# Patient Record
Sex: Male | Born: 1960 | Race: Black or African American | Hispanic: No | Smoking: Former smoker
Health system: Southern US, Community
[De-identification: ages and names within clinical notes are randomized; demographics above are authoritative.]

## PROBLEM LIST (undated history)

## (undated) DIAGNOSIS — I82409 Acute embolism and thrombosis of unspecified deep veins of unspecified lower extremity: Secondary | ICD-10-CM

## (undated) DIAGNOSIS — J9600 Acute respiratory failure, unspecified whether with hypoxia or hypercapnia: Secondary | ICD-10-CM

## (undated) DIAGNOSIS — I4892 Unspecified atrial flutter: Secondary | ICD-10-CM

## (undated) DIAGNOSIS — R011 Cardiac murmur, unspecified: Secondary | ICD-10-CM

## (undated) DIAGNOSIS — Z950 Presence of cardiac pacemaker: Secondary | ICD-10-CM

## (undated) DIAGNOSIS — I2510 Atherosclerotic heart disease of native coronary artery without angina pectoris: Secondary | ICD-10-CM

## (undated) DIAGNOSIS — I10 Essential (primary) hypertension: Secondary | ICD-10-CM

## (undated) DIAGNOSIS — F32A Depression, unspecified: Secondary | ICD-10-CM

## (undated) DIAGNOSIS — I509 Heart failure, unspecified: Secondary | ICD-10-CM

## (undated) DIAGNOSIS — K7200 Acute and subacute hepatic failure without coma: Secondary | ICD-10-CM

## (undated) DIAGNOSIS — N289 Disorder of kidney and ureter, unspecified: Secondary | ICD-10-CM

## (undated) DIAGNOSIS — E119 Type 2 diabetes mellitus without complications: Secondary | ICD-10-CM

## (undated) DIAGNOSIS — N179 Acute kidney failure, unspecified: Secondary | ICD-10-CM

## (undated) HISTORY — PX: TONSILLECTOMY: SUR1361

## (undated) HISTORY — PX: HERNIA REPAIR: SHX51

## (undated) HISTORY — PX: COLONOSCOPY: SHX174

## (undated) HISTORY — PX: CYST REMOVAL NECK: SHX6281

---

## 2015-12-24 DIAGNOSIS — T24231A Burn of second degree of right lower leg, initial encounter: Secondary | ICD-10-CM | POA: Diagnosis not present

## 2017-09-21 DIAGNOSIS — K4090 Unilateral inguinal hernia, without obstruction or gangrene, not specified as recurrent: Secondary | ICD-10-CM | POA: Diagnosis not present

## 2017-09-21 DIAGNOSIS — F17210 Nicotine dependence, cigarettes, uncomplicated: Secondary | ICD-10-CM | POA: Diagnosis not present

## 2017-09-21 DIAGNOSIS — F17200 Nicotine dependence, unspecified, uncomplicated: Secondary | ICD-10-CM | POA: Diagnosis not present

## 2017-09-21 DIAGNOSIS — Z0000 Encounter for general adult medical examination without abnormal findings: Secondary | ICD-10-CM | POA: Diagnosis not present

## 2017-09-21 DIAGNOSIS — C61 Malignant neoplasm of prostate: Secondary | ICD-10-CM | POA: Diagnosis not present

## 2017-09-27 DIAGNOSIS — I10 Essential (primary) hypertension: Secondary | ICD-10-CM | POA: Diagnosis not present

## 2017-09-27 DIAGNOSIS — F17200 Nicotine dependence, unspecified, uncomplicated: Secondary | ICD-10-CM | POA: Diagnosis not present

## 2017-09-27 DIAGNOSIS — E1165 Type 2 diabetes mellitus with hyperglycemia: Secondary | ICD-10-CM | POA: Diagnosis not present

## 2017-09-27 DIAGNOSIS — E785 Hyperlipidemia, unspecified: Secondary | ICD-10-CM | POA: Diagnosis not present

## 2017-10-09 ENCOUNTER — Ambulatory Visit: Payer: BLUE CROSS/BLUE SHIELD | Admitting: General Surgery

## 2017-10-30 ENCOUNTER — Ambulatory Visit: Payer: BLUE CROSS/BLUE SHIELD | Admitting: General Surgery

## 2018-07-17 DIAGNOSIS — Z23 Encounter for immunization: Secondary | ICD-10-CM | POA: Diagnosis not present

## 2018-07-17 DIAGNOSIS — Z716 Tobacco abuse counseling: Secondary | ICD-10-CM | POA: Diagnosis not present

## 2018-07-17 DIAGNOSIS — E1165 Type 2 diabetes mellitus with hyperglycemia: Secondary | ICD-10-CM | POA: Diagnosis not present

## 2018-07-17 DIAGNOSIS — Z13220 Encounter for screening for lipoid disorders: Secondary | ICD-10-CM | POA: Diagnosis not present

## 2018-08-21 DIAGNOSIS — Z9889 Other specified postprocedural states: Secondary | ICD-10-CM | POA: Insufficient documentation

## 2018-09-06 DIAGNOSIS — Z7189 Other specified counseling: Secondary | ICD-10-CM | POA: Diagnosis not present

## 2018-09-06 DIAGNOSIS — E1165 Type 2 diabetes mellitus with hyperglycemia: Secondary | ICD-10-CM | POA: Diagnosis not present

## 2019-06-22 DIAGNOSIS — I82409 Acute embolism and thrombosis of unspecified deep veins of unspecified lower extremity: Secondary | ICD-10-CM

## 2019-06-22 HISTORY — DX: Acute embolism and thrombosis of unspecified deep veins of unspecified lower extremity: I82.409

## 2019-07-08 DIAGNOSIS — K7200 Acute and subacute hepatic failure without coma: Secondary | ICD-10-CM | POA: Insufficient documentation

## 2019-07-08 DIAGNOSIS — I480 Paroxysmal atrial fibrillation: Secondary | ICD-10-CM | POA: Insufficient documentation

## 2019-07-08 DIAGNOSIS — IMO0002 Reserved for concepts with insufficient information to code with codable children: Secondary | ICD-10-CM | POA: Insufficient documentation

## 2019-07-08 DIAGNOSIS — R570 Cardiogenic shock: Secondary | ICD-10-CM | POA: Insufficient documentation

## 2019-07-08 DIAGNOSIS — N179 Acute kidney failure, unspecified: Secondary | ICD-10-CM | POA: Insufficient documentation

## 2019-08-01 DIAGNOSIS — I509 Heart failure, unspecified: Secondary | ICD-10-CM | POA: Insufficient documentation

## 2019-08-01 DIAGNOSIS — I5023 Acute on chronic systolic (congestive) heart failure: Secondary | ICD-10-CM | POA: Insufficient documentation

## 2019-08-01 DIAGNOSIS — I5022 Chronic systolic (congestive) heart failure: Secondary | ICD-10-CM | POA: Insufficient documentation

## 2019-08-22 DIAGNOSIS — R911 Solitary pulmonary nodule: Secondary | ICD-10-CM | POA: Insufficient documentation

## 2019-08-24 DIAGNOSIS — E119 Type 2 diabetes mellitus without complications: Secondary | ICD-10-CM | POA: Insufficient documentation

## 2019-08-24 DIAGNOSIS — R9431 Abnormal electrocardiogram [ECG] [EKG]: Secondary | ICD-10-CM | POA: Insufficient documentation

## 2019-08-24 DIAGNOSIS — I82403 Acute embolism and thrombosis of unspecified deep veins of lower extremity, bilateral: Secondary | ICD-10-CM | POA: Insufficient documentation

## 2019-08-24 DIAGNOSIS — N186 End stage renal disease: Secondary | ICD-10-CM | POA: Insufficient documentation

## 2019-09-28 DIAGNOSIS — J9600 Acute respiratory failure, unspecified whether with hypoxia or hypercapnia: Secondary | ICD-10-CM | POA: Insufficient documentation

## 2019-12-11 ENCOUNTER — Encounter (HOSPITAL_COMMUNITY): Payer: Self-pay

## 2019-12-11 ENCOUNTER — Ambulatory Visit (HOSPITAL_COMMUNITY): Payer: Medicaid Other | Admitting: Physical Therapy

## 2019-12-12 ENCOUNTER — Other Ambulatory Visit: Payer: Self-pay

## 2019-12-12 ENCOUNTER — Ambulatory Visit (HOSPITAL_COMMUNITY)
Admission: RE | Admit: 2019-12-12 | Discharge: 2019-12-12 | Disposition: A | Discharge status: Home / Self Care | Payer: Medicaid Other | Source: Ambulatory Visit | Attending: Internal Medicine | Admitting: Internal Medicine

## 2019-12-12 ENCOUNTER — Encounter: Payer: Self-pay | Admitting: Internal Medicine

## 2019-12-12 ENCOUNTER — Ambulatory Visit (INDEPENDENT_AMBULATORY_CARE_PROVIDER_SITE_OTHER): Payer: Medicaid Other | Admitting: Internal Medicine

## 2019-12-12 DIAGNOSIS — R0600 Dyspnea, unspecified: Secondary | ICD-10-CM

## 2019-12-12 DIAGNOSIS — R0609 Other forms of dyspnea: Secondary | ICD-10-CM

## 2019-12-12 NOTE — Patient Instructions (Signed)
No pulmonary medications are needed   Please remember to go to the  x-ray department  for your tests - we will call you with the results when they are available    Please schedule a follow up office visit in 6 weeks, call sooner if needed - PFTs on return

## 2019-12-12 NOTE — Assessment & Plan Note (Addendum)
Onset fall 2020 dx chf/ aflutter AKI/ HCAP req trach > out by d/c room air  - Quit smoking 06/2019  - 12/12/2019   Walked RA x two laps =  approx 239ft @ moderate pace - stopped due to sob with sats of 100 % at the end of the study.  Available records reviewed - luckily it does not appear he had significant underlying copd or other irreversible lung injury acutely or chronically despite suggestion of clubbing on exam but needs f/u    >>> rec proceed with rehab as appears severely debilitated by well compensated from chf perspective / return for PFTs for baseline.  >>> beware of ACEi use in this setting (ACE inhibitors are problematic in  pts with airway complaints because  even experienced pulmonologists can't always distinguish ace effects from copd/asthma.  By themselves they don't actually cause a problem, much like oxygen can't by itself start a fire, but they certainly serve as a powerful catalyst or enhancer for any "fire"  or inflammatory process in the upper airway, be it caused by an ET  tube or more commonly reflux (especially in the obese or pts with known GERD or who are on biphoshonates).   >>> for now ok to continue acei          Each maintenance medication was reviewed in detail including emphasizing most importantly the difference between maintenance and prns and under what circumstances the prns are to be triggered using an action plan format where appropriate.  Total time for H and P, chart review, counseling,  directly observing portions of ambulatory 02 saturation study/  and generating customized AVS unique to this office visit / charting = 37min

## 2019-12-12 NOTE — Progress Notes (Addendum)
Darryl Frye, male    DOB: 12/19/1960, 59 y.o.   MRN: [id number]   Brief patient profile:  58 yobm quit smoking in 06/2019 with onset of sob x one month [place] with dx of chf/ pna/ pe / kidney failure temprorary HD > trach out and d/c p 100 days referred to pulmonary clinic in [place]  12/12/2019 by Dr  Abran Richard in [facility].   ADMIT DATE: 07/07/2019  DISCHARGE DATE: 10/25/2019  DISCHARGE DIAGNOSES:  Cardiogenic shock (HCC) Atrial fibrillation/flutter (HCC) Shock liver AKI (acute kidney injury) (HCC) CHF (congestive heart failure) (HCC) Prolonged Q-T interval on ECG DVT, bilateral lower limbs (HCC) DM (diabetes mellitus), type 2 (HCC) Acute respiratory failure ([place]) [place] / NOTABLE PROCEDURES: This is a pleasant 59 year old male with diabetes. He was admitted on 07/07/2019 He presented here from outside hospital with chief complaint of shortness of breath. At the outside ED he was in a flutter with 2-1 conduction with a rate of 150, he then became hypotensive and bradycardic, he was seen in acute respiratory distress, subsequently intubated after he failed BiPAP trial. CT of the chest showed no pulmonary embolism. He had elevated inflammatory markers. He was initially admitted for a fib/flutter with RVR, hypotension, acute hypoxic respiratory failure. Echocardiogram showed EF of 5 to 10%. His hospital course was [place] and complicated with cardiogenic shock, difficulty extubating requiring multiple intubations eventually ending in tracheostomy and PEG,Acute kidney injury requiring temporary CRRT, resolved and off dialysis and pneumonia/ MRSA pneumonia, Pseudomonas pneumonia, VRE bacteremia,Candida lusitaniaefungemia /  Acute hypoxic respiratory failure Difficulty extubating requiring multiple intubations eventually underwent tracheostomy on 08/08/2019, and PEG on 12/23/20202 He made significant progress, tracheostomy was downsized and  subsequently Decannulated on 10/23/2019 currently doing well on room air Prolonged hospital course, multiple rounds of intubation VRE bacteremia - resolved Candida Lusitaniae fungemia- resolved Cardiogenic shock, resolved Recommend follow up CT chest in 4-6 weeks with outpatient pulm follow-up Outpatient referral to pulmonary    History of paroxysmal A. fib Prolonged QTC-resolved Chronic HFrEF (EF 25 to 30% in 07/2019) Nonischemic cardiomyopathy Cardiogenic shock,resolved Continue Eliquis, lisinopril 2.5 mg daily, Lasix 20 mg daily, metoprolol 12.5 mg twice daily. Marginal blood pressure not able to add Aldactone at this time Repeated limited echo-EF is 20 to 25%, moderately enlarged RV Appreciate consult from heart failure team, he was given heart failure education and follow-up instruction. His LifeVest will be delivered today, and outpatient follow with cardiology for evaluation for ICD    Dysphagia, improved currently tolerating regular diet well PEG tube removed  Acute renal failure requiring hemodialysis - felt to be combination of ATN and fungal sepsis. Resolved. Now off HD.  Bilateral lower extremity DVTs Continue eliquis 62m BID     Chronic type 2 diabetes A1c 8.5 in 07/2019. Started on Metformin and glipizide Carb controlled diet   Right knee pain/swelling/tenderness, improved Recent VRE bacteremia and Candida infection, status post antibiotics/antifungals -Knee x-ray showed no evidence of acute fracture or dislocation. Noted to have moderate to large suprapatellar joint effusion -Uric acid WNL -CRP, ESR elevated -Orthopedics consulted, status post arthrocentesis 09/03/2018 -Fluid analysis negative for crystals, colchicine discontinued -Fluid culture negative, no growth. No surgical intervention needed per orthopedics. 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  Currently he is able to walk with a walker  His management was consulted to assist with medication and discharge His medications were filled from the lobby pharmacy He was provided coupon for Eliquis. His Medicaid is pending. If not approved in the next 1 month he will need to be transitioned to Coumadin. We had a [place] conversation about this with the patient and case management. He will need follow-up with his PCP in a week time. He will follow with PCP for routine checkups and medication refills  INSTRUCTIONS:  Disposition: Home Activity: As tolerated Diet/Tube feeding: Carb controlled cardiac diet  DISCHARGE MEDICATIONS:   Medication List   START taking these medications  apixaban 5 mg Tab tablet Commonly known as: ELIQUIS take 1 tablet by mouth two times daily Indications: Treatment of DVT/PE  furosemide 20 mg Tab Commonly known as: LASIX take 1 tablet by mouth every day  glipiZIDE 5 mg Tab Commonly known as: GLUCOTROL take 1 tablet by mouth every morning for 30 days  lisinopriL 2.5 mg Tab Commonly known as: PRINIVIL take 1 tablet by mouth every day  magnesium oxide 400 mg (241.3 mg magnesium) Tab Commonly known as: MAG-OX take 1 tablet by mouth two times daily  metFORMIN 500 mg Tab Commonly known as: GLUCOPHAGE take 1 tablet by mouth two times daily with meals  metoprolol tartrate 25 mg Tab Commonly known as: LOPRESSOR take 0.5 tablets by mouth two times daily  sertraline 50 mg Tab Commonly known as: ZOLOFT take 1 tablet by mouth every day    Where to Get Your Medications   You can get these medications from any pharmacy  Bring a paper prescription for each of these medications  apixaban 5 mg Tab tablet  furosemide 20 mg Tab  glipiZIDE 5 mg Tab  lisinopriL 2.5 mg Tab   magnesium oxide 400 mg (241.3 mg magnesium) Tab  metFORMIN 500 mg Tab  metoprolol tartrate 25 mg Tab  sertraline 50 mg Tab    History of Present Illness  12/12/2019  Pulmonary/ 1st office eval/Darryl Frye s/p one moderna/ second due May 4th 2021 Chief Complaint  Patient presents with  . Pulmonary Consult    Referred by Dr Abran Richard. Pt c/o SOB since Nov 2020. He gets winded walking short distances such as room to room at home.   Dyspnea:  Room to room / rides a scooter for more than that - starting rehab in [place] in one week Cough: none  Sleep: bed is flat/ 2 pillows  SABA use: none  02 none   No obvious day to day or daytime variability or assoc excess/ purulent sputum or mucus plugs or hemoptysis or cp or chest tightness, subjective wheeze or overt sinus or hb symptoms.   Sleeps  without nocturnal  or early am exacerbation  of respiratory  c/o's or need for noct saba. Also denies any obvious fluctuation of symptoms with weather or environmental changes or other aggravating or alleviating factors except as outlined above   No unusual exposure hx or h/o childhood pna/ asthma or knowledge of premature birth.  Current Allergies, Complete Past Medical History, Past Surgical History, Family History, and Social History were reviewed in [employer] record.  ROS  The following are not active complaints unless bolded Hoarseness, sore throat, dysphagia, dental problems, itching, sneezing,  nasal congestion or discharge of excess mucus or purulent secretions, ear ache,   fever, chills, sweats, unintended wt loss or wt gain, classically pleuritic or exertional cp,  orthopnea pnd or arm/hand swelling  or leg swelling, presyncope, palpitations, abdominal pain, anorexia, nausea, vomiting, diarrhea  or change in bowel habits or change in bladder habits, change in stools or change in urine, dysuria, hematuria,  rash, arthralgias, visual complaints, headache, numbness, weakness  or ataxia or problems with walking or coordination,  change in mood or  memory.          No past medical history on file.  Outpatient Medications Prior to Visit  Medication Sig Dispense Refill  . apixaban (ELIQUIS) 5 MG TABS tablet Take 5 mg by mouth 2 (two) times daily.    . furosemide (LASIX) 20 MG tablet Take 20 mg by mouth daily.    Marland Kitchen glipiZIDE (GLUCOTROL) 5 MG tablet Take 5 mg by mouth daily before breakfast.    . lisinopril (ZESTRIL) 2.5 MG tablet Take 1 tablet by mouth daily.    . magnesium oxide (MAG-OX) 400 MG tablet Take 400 mg by mouth 2 (two) times daily as needed.    . metFORMIN (GLUCOPHAGE) 500 MG tablet Take 500 mg by mouth 2 (two) times daily with a meal.    . metoprolol tartrate (LOPRESSOR) 25 MG tablet Take 25 mg by mouth daily.    . sertraline (ZOLOFT) 50 MG tablet Take 50 mg by mouth daily.        Objective:     BP 110/80 (BP Location: Left Arm, Cuff Size: Normal)   Pulse 81   Temp (!) 97 F (36.1 C) (Temporal)   Ht 5' 11"  (1.803 m)   Wt 176 lb (79.8 kg)   SpO2 99% Comment: on RA  BMI 24.55 kg/m   SpO2: 99 %(on RA)   amb hoarse bm uses arms to stand from chair     HEENT : pt wearing mask not removed for exam due to covid -19 concerns.    NECK :  without JVD/Nodes/TM/ nl carotid upstrokes bilaterally   LUNGS: no acc muscle use,  Nl contour chest which is clear to A and P bilaterally without cough on insp or exp maneuvers   CV:  RRR  no s3 or murmur or increase in P2, and no edema   ABD:  soft and nontender with nl inspiratory excursion in the supine position. No bruits or organomegaly appreciated, bowel sounds nl  MS: walks with cane / ext warm without deformities, calf tenderness, cyanosis - note mild/moderate clubbing but No obvious joint restrictions   SKIN: warm and dry without lesions    NEURO:  alert, approp, nl sensorium uses arms to stand from sitting position.      CXR PA and Lateral:   12/12/2019 :    I personally reviewed images  and  impression as follows:   CM with non-specific markings      Assessment   DOE (dyspnea on exertion) Onset fall 2020 dx chf/ aflutter AKI/ HCAP req trach > out by d/c room air  - Quit smoking 06/2019  - 12/12/2019   Walked RA x two laps =  approx 269f @ moderate pace - stopped due to sob with sats of 100 % at the end of the study.  Available records reviewed - luckily it does not appear he had significant underlying copd or other irreversible lung injury acutely or chronically    >>> rec proceed with rehab as appears severely debilitated by well compensated from chf perspective / return for PFTs for baseline.  >>> beware of ACEi use in this setting (ACE inhibitors are problematic  in  pts with airway complaints because  even experienced pulmonologists can't always distinguish ace effects from copd/asthma.  By themselves they don't actually cause a problem, much like oxygen can't by itself start a fire, but they certainly serve as a powerful catalyst or enhancer for any "fire"  or inflammatory process in the upper airway, be it caused by an ET  tube or more commonly reflux (especially in the obese or pts with known GERD or who are on biphoshonates).   >>> for now ok to continue acei          Each maintenance medication was reviewed in detail including emphasizing most importantly the difference between maintenance and prns and under what circumstances the prns are to be triggered using an action plan format where appropriate.  Total time for H and P, chart review, counseling,  directly observing portions of ambulatory 02 saturation study/  and generating customized AVS unique to this office visit / charting = 45mn             MChristinia Gully MD 12/12/2019

## 2019-12-15 NOTE — Progress Notes (Signed)
Spoke with pt and notified of results per Dr. Wert. Pt verbalized understanding and denied any questions. 

## 2019-12-17 ENCOUNTER — Other Ambulatory Visit: Payer: Self-pay

## 2019-12-17 ENCOUNTER — Ambulatory Visit (HOSPITAL_COMMUNITY): Payer: Medicaid Other | Attending: Internal Medicine | Admitting: Physical Therapy

## 2019-12-17 DIAGNOSIS — M6281 Muscle weakness (generalized): Secondary | ICD-10-CM | POA: Insufficient documentation

## 2019-12-17 DIAGNOSIS — R262 Difficulty in walking, not elsewhere classified: Secondary | ICD-10-CM | POA: Diagnosis not present

## 2019-12-17 NOTE — Therapy (Signed)
[facility] [address] [place], [place], [address] Phone: [phone]   Fax:  [phone]  Physical Therapy Evaluation  Patient Details  Name: Darryl Frye MRN: [id number] Date of Birth: [birth year] Referring Provider (PT): Abran Richard   Encounter Date: 12/17/2019  PT End of Session - 12/17/19 1048    Visit Number  1    Number of Visits  16    Date for PT Re-Evaluation  01/28/20   eval 12/17/19   Authorization Type  Medicare primary. Medicaid secondary.    Progress Note Due on Visit  10    PT Start Time  1000    PT Stop Time  1044    PT Time Calculation (min)  44 min    Activity Tolerance  Patient limited by fatigue    Behavior During Therapy  [facility] for tasks assessed/performed       No past medical history on file.  No past surgical history on file.  There were no vitals filed for this visit.   Subjective Assessment - 12/17/19 1111    Subjective  Patient presents to clinic for deconditioning after 101 day stay at hospital. Prior to his hospitalization he was short of breath but he always thought it was because of his smoking. Reports he realized in the hospital it was because of his heart and he had class 4 heart failure. Patient had a tracheotomy at one point, had DVTs in both his legs and he thought he wasn't going to survive. States he now has a defibrillator attached to him and he is very short of breath and weak. He uses a cane and walking stick but prior to the hospital he wasn't using anything. Reports that he was able to walk about 10 minutes prior to his hospitalization but now gets fatigued with a minute. He would like to be able to walk further and not need an assistive device.    Pertinent History  SOB, systolic congestiv failure, class 4 heart failure    Limitations  Standing;Walking;House hold activities    How [place] can you stand comfortably?  1-2 minutes    How [place] can you walk comfortably?  1-2 minutes    Patient  Stated Goals  to be able to walk further    Currently in Pain?  No/denies         [facility] PT Assessment - 12/17/19 0001      Assessment   Medical Diagnosis  deconditioning    Referring Provider (PT)  Abran Richard    Prior Therapy  in hospital      Precautions   Precautions  Fall;Other (comment)   cardiac     Balance Screen   Has the patient fallen in the past 6 months  No    Has the patient had a decrease in activity level because of a fear of falling?   Yes    Is the patient reluctant to leave their home because of a fear of falling?   No      Home Social worker  Private residence    Living Arrangements  Alone    Available Help at Discharge  Family    Type of [place] to enter    Entrance Stairs-Number of Steps  3    Entrance Stairs-Rails  Right    [place]  One level    [place] seat;Walker - 2 wheels;Cane -  single point      Prior Function   Level of Independence  Independent      Cognition   Overall Cognitive Status  Within Functional Limits for tasks assessed      Observation/Other Assessments   Observations  seated BP at start 102/70, after walking 110/80 BP       Transfers   Five time sit to stand comments   1:17 with deep breathing throughout      Ambulation/Gait   Ambulation/Gait  Yes    Ambulation/Gait Assistance  6: Modified independent (Device/Increase time);5: Supervision    Ambulation Distance (Feet)  124 Feet    Assistive device  --   walking stick   Gait Pattern  Decreased arm swing - right;Decreased arm swing - left;Decreased stride length    Gait velocity  decreased    Gait Comments  2MW -   3 standing rest breaks and one seated rest break at end, able to walk 226 feet in 5:05      Balance   Balance Assessed  Yes      Static Standing Balance   Static Standing - Comment/# of Minutes   SLS 3 seconds on L and 8 seconds on R                 Objective measurements  completed on examination: See above findings.              PT Education - 12/17/19 1110    Education Details  on importance of breathing and not holding breath and why. on walking program (starting at two minutes), on performing STS at home. Current functional status discussed and daily BP monitoring.    Person(s) Educated  Patient    Methods  Explanation    Comprehension  Verbalized understanding       PT Short Term Goals - 12/17/19 1050      PT SHORT TERM GOAL #1   Title  Patient will be independent in self management strategies to improve quality of life and functional outcomes. Patient will be independent in self management strategies to improve quality of life and functional outcomes.    Time  4    Period  Weeks    Status  New    Target Date  01/14/20      PT SHORT TERM GOAL #2   Title  Patient will be able to walk at least 226 feet in 2 minutes with LRAD to demonstrate improved functional endurance.    Time  4    Period  Weeks    Status  New    Target Date  01/14/20      PT SHORT TERM GOAL #3   Title  Patient will report at least 25% improvement in overall symptoms and/or functional ability.    Time  4    Period  Weeks    Status  New    Target Date  01/14/20        PT Bord Term Goals - 12/17/19 1050      PT Hanahan TERM GOAL #1   Title  Patient will be able to ambulate for at least 10 minutes (with standing rest breaks as needed) to demonstrate improved functional endurance    Time  8    Period  Weeks    Status  New    Target Date  02/11/20      PT Lensing TERM GOAL #2   Title  Patient will be able to perfrom 5x  STS in <30 seconds to demonstrate improved functional endurance    Time  8    Period  Weeks    Status  New    Target Date  02/11/20      PT Feigel TERM GOAL #3   Title  Patient will report at least 50% improvement in overall symptoms and/or functional ability.    Time  8    Period  Weeks    Status  New    Target Date  02/11/20              Plan - 12/17/19 1045    Clinical Impression Statement  Patient presents to therapy after 101-day hospitalization where he had congestive heart failure. Patient presents with severe deconditioning, requiring a standing rest break with walking back to the treatment room. Monitored vitals throughout session and educated patient on self-monitoring as well as starting a walking program in his home. Answered all questions and patient would greatly benefit from skilled physical therapy to improve functional mobility in the home and in the community.    Personal Factors and Comorbidities  Comorbidity 1;Comorbidity 2;Comorbidity 3+    Comorbidities  shortness of breath, systolic congestiv failure, class 4 heart failure    Examination-Activity Limitations  Bathing;Squat;Stairs;Stand;Transfers;Lift;Locomotion Level;Carry;Bend    Examination-Participation Restrictions  Community Activity;Cleaning;Meal Prep    Stability/Clinical Decision Making  Evolving/Moderate complexity    Clinical Decision Making  Moderate    Rehab Potential  Good    PT Frequency  2x / week    PT Duration  8 weeks    PT Treatment/Interventions  ADLs/Self Care Home Management;Cryotherapy;Electrical Stimulation;Moist Heat;Balance training;Therapeutic exercise;Therapeutic activities;Functional mobility training;Gait training;Stair training;DME Instruction;Neuromuscular re-education;Patient/family education;Manual techniques;Energy conservation;Passive range of motion    PT Next Visit Plan  monitor BP, scan in medication list, walking/endurance training    PT Home Exercise Plan  4/26 walking program - start at 2 minutes, STS - start at 2    Consulted and Agree with Plan of Care  Patient       Patient will benefit from skilled therapeutic intervention in order to improve the following deficits and impairments:  Abnormal gait, Decreased endurance, Decreased skin integrity, Decreased strength, Decreased activity tolerance,  Decreased balance, Decreased mobility, Difficulty walking, Cardiopulmonary status limiting activity, Decreased range of motion  Visit Diagnosis: Difficulty in walking, not elsewhere classified  Muscle weakness (generalized)     Problem List Patient Active Problem List   Diagnosis Date Noted  . DOE (dyspnea on exertion) 12/12/2019    1:22 PM, 12/17/19 Jerene Pitch, DPT Physical Therapy with [facility]  [phone] office  [facility] [address] [place], [place], [address] Phone: [phone]   Fax:  [phone]  Name: Jashawn Floyd MRN: [id number] Date of Birth: 01/26/1961

## 2019-12-19 ENCOUNTER — Encounter (HOSPITAL_COMMUNITY): Payer: Medicaid Other | Admitting: Physical Therapy

## 2019-12-24 ENCOUNTER — Other Ambulatory Visit: Payer: Self-pay

## 2019-12-24 ENCOUNTER — Encounter (HOSPITAL_COMMUNITY): Payer: Self-pay

## 2019-12-24 ENCOUNTER — Ambulatory Visit (HOSPITAL_COMMUNITY): Payer: Medicaid Other | Attending: Internal Medicine

## 2019-12-24 VITALS — BP 115/80 | HR 84

## 2019-12-24 DIAGNOSIS — M6281 Muscle weakness (generalized): Secondary | ICD-10-CM | POA: Insufficient documentation

## 2019-12-24 DIAGNOSIS — R262 Difficulty in walking, not elsewhere classified: Secondary | ICD-10-CM

## 2019-12-24 NOTE — Therapy (Signed)
[facility] [address] [place], [place], [address] Phone: [phone]   Fax:  [phone]  Physical Therapy Treatment  Patient Details  Name: Darryl Frye MRN: [id number] Date of Birth: 1960/09/29 Referring Provider (PT): Abran Richard   Encounter Date: 12/24/2019  PT End of Session - 12/24/19 1130    Visit Number  2    Number of Visits  16    Date for PT Re-Evaluation  01/28/20   eval 12/17/19   Authorization Type  Medicare primary. Medicaid secondary.    Progress Note Due on Visit  10    PT Start Time  1125    PT Stop Time  1205    PT Time Calculation (min)  40 min    Activity Tolerance  Patient limited by fatigue    Behavior During Therapy  [facility] for tasks assessed/performed       History reviewed. No pertinent past medical history.  History reviewed. No pertinent surgical history.  Vitals:   12/24/19 1135  BP: 115/80  Pulse: 84    Subjective Assessment - 12/24/19 1129    Subjective  Pt stated he is SOB today, no reports of pain or changes since last session.  Reports when he gets SOB he gets hot.  Pt stated he wrote down the medication though forgot list at home.    Pertinent History  SOB, systolic congestiv failure, class 4 heart failure    Patient Stated Goals  to be able to walk further    Currently in Pain?  No/denies         [facility] PT Assessment - 12/24/19 0001      Assessment   Medical Diagnosis  deconditioning    Referring Provider (PT)  Abran Richard    Next MD Visit  01/10/20    Prior Therapy  in hospital      Precautions   Precautions  Fall;Other (comment)   cardiac                  [facility] Adult PT Treatment/Exercise - 12/24/19 0001      Ambulation/Gait   Ambulation/Gait  Yes    Ambulation/Gait Assistance  6: Modified independent (Device/Increase time);5: Supervision    Ambulation Distance (Feet)  226 Feet   1 standing and 2 seated rest breaks; Able to ambulate 47", t   Assistive device   --   walking stick   Gait Pattern  Decreased arm swing - right;Decreased arm swing - left;Decreased stride length    Gait velocity  decreased    Gait Comments  Able to ambulate for 47", 53", 58" seconds prior fatigue      Exercises   Exercises  Knee/Hip      Knee/Hip Exercises: Standing   Heel Raises  5 reps;2 sets    Heel Raises Limitations  slow and controlled    Hip Abduction  2 sets;5 reps;Both;Knee straight    Gait Training  Timed gait endurance prior rest break; 3 sets able to ambulate for 47", then 53", 58" wiht seated       Knee/Hip Exercises: Seated   Sit to Sand  5 reps;without UE support;2 sets   eccentric control            PT Education - 12/24/19 1309    Education Details  Reviewed goals, educated importance of HEP compliance and assured compliance with current exercises at home.  Vitals and fatigue levels assessed thorugh session.       PT  Short Term Goals - 12/17/19 1050      PT SHORT TERM GOAL #1   Title  Patient will be independent in self management strategies to improve quality of life and functional outcomes. Patient will be independent in self management strategies to improve quality of life and functional outcomes.    Time  4    Period  Weeks    Status  New    Target Date  01/14/20      PT SHORT TERM GOAL #2   Title  Patient will be able to walk at least 226 feet in 2 minutes with LRAD to demonstrate improved functional endurance.    Time  4    Period  Weeks    Status  New    Target Date  01/14/20      PT SHORT TERM GOAL #3   Title  Patient will report at least 25% improvement in overall symptoms and/or functional ability.    Time  4    Period  Weeks    Status  New    Target Date  01/14/20        PT Remick Term Goals - 12/17/19 1050      PT Ciccarelli TERM GOAL #1   Title  Patient will be able to ambulate for at least 10 minutes (with standing rest breaks as needed) to demonstrate improved functional endurance    Time  8    Period  Weeks     Status  New    Target Date  02/11/20      PT H[place]on TERM GOAL #2   Title  Patient will be able to perfrom 5x STS in <30 seconds to demonstrate improved functional endurance    Time  8    Period  Weeks    Status  New    Target Date  02/11/20      PT Mal[place] TERM GOAL #3   Title  Patient will report at least 50% improvement in overall symptoms and/or functional ability.    Time  8    Period  Weeks    Status  New    Target Date  02/11/20            Plan - 12/24/19 1314    Clinical Impression Statement  Reviewed goals, educated importance of HEP compliance and assured compliance with current exercises at home.  Vitals and fatigue levels assessed thorugh session.  Session focus on activity tolerance training and LE strengthening.  Pt able to ambulate 274ft though required 3 seated rest breaks, longest endurance lasted for 58" prior need for rest breaks. Fatigue levels range from 3-8/10 thorugh session.  No reports of pain or LOB episodes through session, was limited by fatigue.    Personal Factors and Comorbidities  Comorbidity 1;Comorbidity 2;Comorbidity 3+    Comorbidities  shortness of breath, systolic congestiv failure, class 4 heart failure    Examination-Activity Limitations  Bathing;Squat;Stairs;Stand;Transfers;Lift;Locomotion Level;Carry;Bend    Examination-Participation Restrictions  Community Activity;Cleaning;Meal Prep    Clinical Decision Making  Moderate    Rehab Potential  Good    PT Frequency  2x / week    PT Duration  8 weeks    PT Treatment/Interventions  ADLs/Self Care Home Management;Cryotherapy;Electrical Stimulation;Moist Heat;Balance training;Therapeutic exercise;Therapeutic activities;Functional mobility training;Gait training;Stair training;DME Instruction;Neuromuscular re-education;Patient/family education;Manual techniques;Energy conservation;Passive range of motion    PT Next Visit Plan  monitor BP, scan in medication list, walking/endurance training    PT Home  Exercise Plan  4/26 walking program -  start at 2 minutes, STS - start at 2       Patient will benefit from skilled therapeutic intervention in order to improve the following deficits and impairments:  Abnormal gait, Decreased endurance, Decreased skin integrity, Decreased strength, Decreased activity tolerance, Decreased balance, Decreased mobility, Difficulty walking, Cardiopulmonary status limiting activity, Decreased range of motion  Visit Diagnosis: Muscle weakness (generalized)  Difficulty in walking, not elsewhere classified     Problem List Patient Active Problem List   Diagnosis Date Noted  . DOE (dyspnea on exertion) 12/12/2019   Ihor Austin, LPTA/CLT; CBIS [id number]  Aldona Lento 12/24/2019, 2:37 PM  [facility] [address] [place], [place], [address] Phone: [phone]   Fax:  [phone]  Name: Darryl Frye MRN: [id number] Date of Birth: [birth year]

## 2019-12-29 ENCOUNTER — Ambulatory Visit (HOSPITAL_COMMUNITY): Payer: Medicaid Other | Admitting: Physical Therapy

## 2019-12-29 ENCOUNTER — Telehealth (HOSPITAL_COMMUNITY): Payer: Self-pay | Admitting: Physical Therapy

## 2019-12-29 NOTE — Telephone Encounter (Signed)
First no show:  Called pt re missed appointment.  Pt stated that the bus did not pick him up.  Rayetta Humphrey, [place] CLT [phone]

## 2019-12-29 NOTE — Telephone Encounter (Signed)
pt called to cx due to no transportation

## 2019-12-31 ENCOUNTER — Ambulatory Visit (HOSPITAL_COMMUNITY): Payer: Medicaid Other | Admitting: Physical Therapy

## 2019-12-31 ENCOUNTER — Other Ambulatory Visit: Payer: Self-pay

## 2019-12-31 ENCOUNTER — Encounter (HOSPITAL_COMMUNITY): Payer: Self-pay | Admitting: Physical Therapy

## 2019-12-31 DIAGNOSIS — R262 Difficulty in walking, not elsewhere classified: Secondary | ICD-10-CM

## 2019-12-31 DIAGNOSIS — M6281 Muscle weakness (generalized): Secondary | ICD-10-CM

## 2019-12-31 NOTE — Therapy (Signed)
[facility] [address] [place], [place], [address] Phone: [phone]   Fax:  [phone]  Physical Therapy Treatment  Patient Details  Name: Darryl Frye MRN: [id number] Date of Birth: [birth year] Referring Provider (PT): Abran Richard   Encounter Date: 12/31/2019  PT End of Session - 12/31/19 1120    Visit Number  3    Number of Visits  16    Date for PT Re-Evaluation  01/28/20   eval 12/17/19   Authorization Type  Medicare primary. Medicaid secondary.    Progress Note Due on Visit  10    PT Start Time  1130    PT Stop Time  1208    PT Time Calculation (min)  38 min    Activity Tolerance  Patient limited by fatigue    Behavior During Therapy  [facility] for tasks assessed/performed       History reviewed. No pertinent past medical history.  History reviewed. No pertinent surgical history.  There were no vitals filed for this visit.  Subjective Assessment - 12/31/19 1134    Subjective  States he is not as short of breath and feels he can walk longer. States he feels like he can start to strengthen his legs now that he is not as tired.    Pertinent History  SOB, systolic congestiv failure, class 4 heart failure    Patient Stated Goals  to be able to walk further         [facility] PT Assessment - 12/31/19 0001      Assessment   Medical Diagnosis  deconditioning    Referring Provider (PT)  Abran Richard    Next MD Visit  01/10/20    Prior Therapy  in hospital                    [facility] Adult PT Treatment/Exercise - 12/31/19 0001      Ambulation/Gait   Ambulation/Gait  Yes    Ambulation/Gait Assistance  6: Modified independent (Device/Increase time);5: Supervision    Ambulation Distance (Feet)  452 Feet    Assistive device  --   walking stick   Gait Comments  7:45 for 2 laps      Knee/Hip Exercises: Aerobic   Nustep  5 minutes, chair position 10, no resistance - cues to breath throughout      Knee/Hip Exercises:  Standing   Forward Step Up  5 reps;Hand Hold: 2;Step Height: 6";3 sets;Both    Other Standing Knee Exercises  3way hip with UE assist in // bars (no rest between movements) 3x5 B               PT Short Term Goals - 12/17/19 1050      PT SHORT TERM GOAL #1   Title  Patient will be independent in self management strategies to improve quality of life and functional outcomes. Patient will be independent in self management strategies to improve quality of life and functional outcomes.    Time  4    Period  Weeks    Status  New    Target Date  01/14/20      PT SHORT TERM GOAL #2   Title  Patient will be able to walk at least 226 feet in 2 minutes with LRAD to demonstrate improved functional endurance.    Time  4    Period  Weeks    Status  New    Target Date  01/14/20  PT SHORT TERM GOAL #3   Title  Patient will report at least 25% improvement in overall symptoms and/or functional ability.    Time  4    Period  Weeks    Status  New    Target Date  01/14/20        PT [place] Term Goals - 12/17/19 1050      PT Sitar TERM GOAL #1   Title  Patient will be able to ambulate for at least 10 minutes (with standing rest breaks as needed) to demonstrate improved functional endurance    Time  8    Period  Weeks    Status  New    Target Date  02/11/20      PT Squyres TERM GOAL #2   Title  Patient will be able to perfrom 5x STS in <30 seconds to demonstrate improved functional endurance    Time  8    Period  Weeks    Status  New    Target Date  02/11/20      PT Saad TERM GOAL #3   Title  Patient will report at least 50% improvement in overall symptoms and/or functional ability.    Time  8    Period  Weeks    Status  New    Target Date  02/11/20            Plan - 12/31/19 1203    Clinical Impression Statement  Focused on walking and standing endurance with cues for patient to rest and breath throughout session. Improved distance with ambulation noted during today's  session. Fatigue end of session but not as much as previous session. Added 3 way hip in standing to home program.    Personal Factors and Comorbidities  Comorbidity 1;Comorbidity 2;Comorbidity 3+    Comorbidities  shortness of breath, systolic congestiv failure, class 4 heart failure    Examination-Activity Limitations  Bathing;Squat;Stairs;Stand;Transfers;Lift;Locomotion Level;Carry;Bend    Examination-Participation Restrictions  Community Activity;Cleaning;Meal Prep    Rehab Potential  Good    PT Frequency  2x / week    PT Duration  8 weeks    PT Treatment/Interventions  ADLs/Self Care Home Management;Cryotherapy;Electrical Stimulation;Moist Heat;Balance training;Therapeutic exercise;Therapeutic activities;Functional mobility training;Gait training;Stair training;DME Instruction;Neuromuscular re-education;Patient/family education;Manual techniques;Energy conservation;Passive range of motion    PT Next Visit Plan  monitor BP, scan in medication list, walking/endurance training    PT Home Exercise Plan  4/26 walking program - start at 2 minutes, STS - start at 2; 5/12 3 way hip standing    Consulted and Agree with Plan of Care  Patient       Patient will benefit from skilled therapeutic intervention in order to improve the following deficits and impairments:  Abnormal gait, Decreased endurance, Decreased skin integrity, Decreased strength, Decreased activity tolerance, Decreased balance, Decreased mobility, Difficulty walking, Cardiopulmonary status limiting activity, Decreased range of motion  Visit Diagnosis: Muscle weakness (generalized)  Difficulty in walking, not elsewhere classified     Problem List Patient Active Problem List   Diagnosis Date Noted  . DOE (dyspnea on exertion) 12/12/2019    12:14 PM, 12/31/19 Jerene Pitch, DPT Physical Therapy with [facility]  [phone] office  [place], [place], [address] Phone: [phone]   Fax:  [phone]  Name: Darryl Frye MRN: [id number] Date of Birth: Oct 29, 1960

## 2020-01-02 ENCOUNTER — Other Ambulatory Visit (HOSPITAL_COMMUNITY)
Admission: RE | Admit: 2020-01-02 | Discharge: 2020-01-02 | Disposition: A | Discharge status: Home / Self Care | Payer: Medicaid Other | Source: Ambulatory Visit | Attending: Internal Medicine | Admitting: Internal Medicine

## 2020-01-02 ENCOUNTER — Other Ambulatory Visit: Payer: Self-pay

## 2020-01-02 DIAGNOSIS — Z01812 Encounter for preprocedural laboratory examination: Secondary | ICD-10-CM | POA: Insufficient documentation

## 2020-01-02 DIAGNOSIS — Z20822 Contact with and (suspected) exposure to covid-19: Secondary | ICD-10-CM | POA: Diagnosis not present

## 2020-01-02 LAB — SARS CORONAVIRUS 2 (TAT 6-24 HRS): SARS Coronavirus 2: NEGATIVE

## 2020-01-05 ENCOUNTER — Encounter (HOSPITAL_COMMUNITY): Payer: Self-pay | Admitting: Physical Therapy

## 2020-01-05 ENCOUNTER — Ambulatory Visit (HOSPITAL_COMMUNITY): Payer: Medicaid Other | Admitting: Physical Therapy

## 2020-01-05 ENCOUNTER — Other Ambulatory Visit: Payer: Self-pay

## 2020-01-05 DIAGNOSIS — M6281 Muscle weakness (generalized): Secondary | ICD-10-CM

## 2020-01-05 DIAGNOSIS — R262 Difficulty in walking, not elsewhere classified: Secondary | ICD-10-CM

## 2020-01-05 NOTE — Therapy (Addendum)
[facility] [address] [place], [place], [address] Phone: [phone]   Fax:  [phone]  Physical Therapy Treatment  Patient Details  Name: Darryl Frye MRN: [id number] Date of Birth: 26-Aug-1960 Referring Provider (PT): Abran Richard   Encounter Date: 01/05/2020  PT End of Session - 01/05/20 1135    Visit Number  4    Number of Visits  16    Date for PT Re-Evaluation  01/28/20   eval 12/17/19   Authorization Type  medicaid primary - submitted for first 3 visits - check auth    Progress Note Due on Visit  10    PT Start Time  1130    PT Stop Time  1200    PT Time Calculation (min)  30 min    Activity Tolerance  Patient limited by fatigue    Behavior During Therapy  [facility] for tasks assessed/performed       History reviewed. No pertinent past medical history.  History reviewed. No pertinent surgical history.  There were no vitals filed for this visit.  Subjective Assessment - 01/05/20 1141    Subjective  States 3 way hip is getting easier. States sometimes his knees start to hurt when he sits down for a [place] period of time. Some times when he is up walking he is fine and other times he has discomfort in knees and shortness of breath. States overall he feels about 60% better    Pertinent History  SOB, systolic congestiv failure, class 4 heart failure    Patient Stated Goals  to be able to walk further         [facility] PT Assessment - 01/05/20 0001      Assessment   Medical Diagnosis  deconditioning    Referring Provider (PT)  Abran Richard    Next MD Visit  01/10/20    Prior Therapy  in hospital      Transfers   Five time sit to stand comments   28.2 seconds with UE on thighs   prior 1:17                   [facility] Adult PT Treatment/Exercise - 01/05/20 0001      Ambulation/Gait   Ambulation/Gait  Yes    Ambulation/Gait Assistance  6: Modified independent (Device/Increase time);5: Supervision    Ambulation  Distance (Feet)  226 Feet    Assistive device  --   walkign stick   Gait Pattern  Decreased arm swing - right;Decreased arm swing - left;Decreased stride length    Gait velocity  decreased    Gait Comments  3"32 for 226 feet adn 126 feet in 2 minutes      Knee/Hip Exercises: Aerobic   Nustep  7 minutes, chair position 10, no resistance - cues to breath throughout      Knee/Hip Exercises: Seated   Sit to Sand  5 reps;without UE support;2 sets               PT Short Term Goals - 01/05/20 1136      PT SHORT TERM GOAL #1   Title  Patient will be independent in self management strategies to improve quality of life and functional outcomes. Patient will be independent in self management strategies to improve quality of life and functional outcomes.    Baseline  compliance with HEP    Time  4    Period  Weeks    Status  On-going  Target Date  01/14/20      PT SHORT TERM GOAL #2   Title  Patient will be able to walk at least 226 feet in 2 minutes with LRAD to demonstrate improved functional endurance.    Baseline  5/17 126 feet in 2 minutes with walking cane. 226 feet in 3 minutes and 32 seconds    Time  4    Period  Weeks    Status  On-going    Target Date  01/14/20      PT SHORT TERM GOAL #3   Title  Patient will report at least 25% improvement in overall symptoms and/or functional ability.    Baseline  01/05/20 60% better    Time  4    Period  Weeks    Status  Achieved    Target Date  01/14/20        PT [place] Term Goals - 01/05/20 1136      PT [place] TERM GOAL #1   Title  Patient will be able to ambulate for at least 10 minutes (with standing rest breaks as needed) to demonstrate improved functional endurance    Baseline  5/17 currently able to stand and walk 1-2 minutes depends on the day    Time  8    Period  Weeks    Status  On-going      PT [place]ailes TERM GOAL #2   Title  Patient will be able to perfrom 5x STS in <30 seconds to demonstrate improved functional  endurance    Baseline  5/17 28.2 seconds    Time  8    Period  Weeks    Status  Achieved      PT Liby TERM GOAL #3   Title  Patient will report at least 50% improvement in overall symptoms and/or functional ability.    Baseline  5/17 60% improvement    Time  8    Period  Weeks    Status  Achieved            Plan - 01/05/20 1307    Clinical Impression Statement  Focused on reassessing goals today. Patient tolerated walking, sit to stands and nu step well today. Continued deconditioning and shortness of breath noted, but improved endurance compared to previous sessions. Patient will continue to benefit from skilled physical therapy to improve functional endurance and mobility.    Personal Factors and Comorbidities  Comorbidity 1;Comorbidity 2;Comorbidity 3+    Comorbidities  shortness of breath, systolic congestiv failure, class 4 heart failure    Examination-Activity Limitations  Bathing;Squat;Stairs;Stand;Transfers;Lift;Locomotion Level;Carry;Bend    Examination-Participation Restrictions  Community Activity;Cleaning;Meal Prep    Rehab Potential  Good    PT Frequency  2x / week    PT Duration  8 weeks    PT Treatment/Interventions  ADLs/Self Care Home Management;Cryotherapy;Electrical Stimulation;Moist Heat;Balance training;Therapeutic exercise;Therapeutic activities;Functional mobility training;Gait training;Stair training;DME Instruction;Neuromuscular re-education;Patient/family education;Manual techniques;Energy conservation;Passive range of motion    PT Next Visit Plan  monitor BP, scan in medication list, walking/endurance training    PT Home Exercise Plan  4/26 walking program - start at 2 minutes, STS - start at 2; 5/12 3 way hip standing    Consulted and Agree with Plan of Care  Patient       Patient will benefit from skilled therapeutic intervention in order to improve the following deficits and impairments:  Abnormal gait, Decreased endurance, Decreased skin integrity,  Decreased strength, Decreased activity tolerance, Decreased balance, Decreased mobility, Difficulty walking, Cardiopulmonary  status limiting activity, Decreased range of motion  Visit Diagnosis: Muscle weakness (generalized)  Difficulty in walking, not elsewhere classified     Problem List Patient Active Problem List   Diagnosis Date Noted  . DOE (dyspnea on exertion) 12/12/2019    1:12 PM, 01/05/20 Jerene Pitch, DPT Physical Therapy with [facility]  [phone] office  [place] [address] [place], [place], [address] Phone: [phone]   Fax:  [phone]  Name: Darryl Frye MRN: [id number] Date of Birth: 1961/05/04

## 2020-01-06 ENCOUNTER — Ambulatory Visit (HOSPITAL_COMMUNITY)
Admission: RE | Admit: 2020-01-06 | Discharge: 2020-01-06 | Disposition: A | Discharge status: Home / Self Care | Payer: Medicaid Other | Source: Ambulatory Visit | Attending: Internal Medicine | Admitting: Internal Medicine

## 2020-01-06 DIAGNOSIS — R0609 Other forms of dyspnea: Secondary | ICD-10-CM

## 2020-01-06 DIAGNOSIS — R0600 Dyspnea, unspecified: Secondary | ICD-10-CM | POA: Diagnosis present

## 2020-01-06 LAB — PULMONARY FUNCTION TEST
DL/VA % pred: 76 %
DL/VA: 3.29 ml/min/mmHg/L
DLCO unc % pred: 61 %
DLCO unc: 17.87 ml/min/mmHg
FEF 25-75 Post: 2.05 L/sec
FEF 25-75 Pre: 1.99 L/sec
FEF2575-%Change-Post: 3 %
FEF2575-%Pred-Post: 62 %
FEF2575-%Pred-Pre: 60 %
FEV1-%Change-Post: 1 %
FEV1-%Pred-Post: 69 %
FEV1-%Pred-Pre: 68 %
FEV1-Post: 2.67 L
FEV1-Pre: 2.64 L
FEV1FVC-%Change-Post: 8 %
FEV1FVC-%Pred-Pre: 96 %
FEV6-%Change-Post: -4 %
FEV6-%Pred-Post: 68 %
FEV6-%Pred-Pre: 71 %
FEV6-Post: 3.32 L
FEV6-Pre: 3.48 L
FEV6FVC-%Change-Post: 2 %
FEV6FVC-%Pred-Post: 104 %
FEV6FVC-%Pred-Pre: 101 %
FVC-%Change-Post: -6 %
FVC-%Pred-Post: 65 %
FVC-%Pred-Pre: 70 %
FVC-Post: 3.32 L
FVC-Pre: 3.56 L
Post FEV1/FVC ratio: 80 %
Post FEV6/FVC ratio: 100 %
Pre FEV1/FVC ratio: 74 %
Pre FEV6/FVC Ratio: 98 %
RV % pred: 105 %
RV: 2.34 L
TLC % pred: 85 %
TLC: 6.14 L

## 2020-01-06 MED ORDER — ALBUTEROL SULFATE (2.5 MG/3ML) 0.083% IN NEBU
2.5000 mg | INHALATION_SOLUTION | Freq: Once | RESPIRATORY_TRACT | Status: AC
  Administered 2020-01-06: 2.5 mg via RESPIRATORY_TRACT

## 2020-01-07 ENCOUNTER — Encounter (HOSPITAL_COMMUNITY): Payer: Self-pay | Admitting: Physical Therapy

## 2020-01-07 ENCOUNTER — Ambulatory Visit (HOSPITAL_COMMUNITY): Payer: Medicaid Other | Admitting: Physical Therapy

## 2020-01-07 ENCOUNTER — Other Ambulatory Visit: Payer: Self-pay

## 2020-01-07 DIAGNOSIS — R262 Difficulty in walking, not elsewhere classified: Secondary | ICD-10-CM | POA: Diagnosis present

## 2020-01-07 DIAGNOSIS — M6281 Muscle weakness (generalized): Secondary | ICD-10-CM

## 2020-01-07 NOTE — Therapy (Signed)
[facility] [address] [place], [place], [address] Phone: [phone]   Fax:  [phone]  Physical Therapy Treatment  Patient Details  Name: Darryl Frye MRN: [id number] Date of Birth: 1961-01-23 Referring Provider (PT): Abran Richard   Encounter Date: 01/07/2020  PT End of Session - 01/07/20 1142    Visit Number  5    Number of Visits  16    Date for PT Re-Evaluation  01/28/20   eval 12/17/19   Authorization Type  medicaid primary - submitted for first 3 visits - check auth    Progress Note Due on Visit  10    PT Start Time  1130    PT Stop Time  1208    PT Time Calculation (min)  38 min    Activity Tolerance  Patient limited by fatigue    Behavior During Therapy  [facility] for tasks assessed/performed       History reviewed. No pertinent past medical history.  History reviewed. No pertinent surgical history.  There were no vitals filed for this visit.  Subjective Assessment - 01/07/20 1142    Subjective  Patient reports he has been more short of breath since yesterday. States he did a breathing test yesterday and did fine but coming off the bus he had a little more difficulty. When PT inquired about defibrillator, patient reported he showered and forgot it.    Pertinent History  SOB, systolic congestiv failure, class 4 heart failure    Patient Stated Goals  to be able to walk further         [facility] PT Assessment - 01/07/20 0001      Assessment   Medical Diagnosis  deconditioning    Referring Provider (PT)  Abran Richard    Next MD Visit  01/10/20    Prior Therapy  in hospital                    [facility] Adult PT Treatment/Exercise - 01/07/20 0001      Knee/Hip Exercises: Seated   [employer] Arc Quad  AROM;Both;3 sets;10 reps   5 seconds, with breathing   Other Seated Knee/Hip Exercises  shoulder flexion and then flexion with walking stick and breathing 4x10  and B              PT Education - 01/07/20 1145     Education Details  Educated patient on importance of monitoring BP and to start logging BP especially when having increased bouts of shortness of breath. BP on this date WNL for patient; 111/74 and 79 BPM for HR.    Person(s) Educated  Patient    Methods  Explanation    Comprehension  Verbalized understanding       PT Short Term Goals - 01/05/20 1136      PT SHORT TERM GOAL #1   Title  Patient will be independent in self management strategies to improve quality of life and functional outcomes. Patient will be independent in self management strategies to improve quality of life and functional outcomes.    Baseline  compliance with HEP    Time  4    Period  Weeks    Status  On-going    Target Date  01/14/20      PT SHORT TERM GOAL #2   Title  Patient will be able to walk at least 226 feet in 2 minutes with LRAD to demonstrate improved functional endurance.    Baseline  5/17  126 feet in 2 minutes with walking cane. 226 feet in 3 minutes and 32 seconds    Time  4    Period  Weeks    Status  On-going    Target Date  01/14/20      PT SHORT TERM GOAL #3   Title  Patient will report at least 25% improvement in overall symptoms and/or functional ability.    Baseline  01/05/20 60% better    Time  4    Period  Weeks    Status  Achieved    Target Date  01/14/20        PT Bjorn Term Goals - 01/05/20 1136      PT Desantiago TERM GOAL #1   Title  Patient will be able to ambulate for at least 10 minutes (with standing rest breaks as needed) to demonstrate improved functional endurance    Baseline  5/17 currently able to stand and walk 1-2 minutes depends on the day    Time  8    Period  Weeks    Status  On-going      PT Mitchell TERM GOAL #2   Title  Patient will be able to perfrom 5x STS in <30 seconds to demonstrate improved functional endurance    Baseline  5/17 28.2 seconds    Time  8    Period  Weeks    Status  Achieved      PT Iden TERM GOAL #3   Title  Patient will report at least  50% improvement in overall symptoms and/or functional ability.    Baseline  5/17 60% improvement    Time  8    Period  Weeks    Status  Achieved            Plan - 01/07/20 1143    Clinical Impression Statement  Altered session's treatment plan today secondary to patient forgetting his defibrillator and patient's recent increase in shortness of breath. Educated patient in importance of monitoring BP and how shoulder ROM exercises will help with breathing and lung expansion. Patient tolerated this well. No shortness of breath noted during today's session. Will return to more lower body strengthening and endurance exercises next session when patient has defibrillator on him.    Personal Factors and Comorbidities  Comorbidity 1;Comorbidity 2;Comorbidity 3+    Comorbidities  shortness of breath, systolic congestiv failure, class 4 heart failure    Examination-Activity Limitations  Bathing;Squat;Stairs;Stand;Transfers;Lift;Locomotion Level;Carry;Bend    Examination-Participation Restrictions  Community Activity;Cleaning;Meal Prep    Rehab Potential  Good    PT Frequency  2x / week    PT Duration  8 weeks    PT Treatment/Interventions  ADLs/Self Care Home Management;Cryotherapy;Electrical Stimulation;Moist Heat;Balance training;Therapeutic exercise;Therapeutic activities;Functional mobility training;Gait training;Stair training;DME Instruction;Neuromuscular re-education;Patient/family education;Manual techniques;Energy conservation;Passive range of motion    PT Next Visit Plan  MAKE SURE PATIENT HAS DEFIBRILATER, monitor BP, walking/endurance training    PT Home Exercise Plan  4/26 walking program - start at 2 minutes, STS - start at 2; 5/12 3 way hip standing    Consulted and Agree with Plan of Care  Patient       Patient will benefit from skilled therapeutic intervention in order to improve the following deficits and impairments:  Abnormal gait, Decreased endurance, Decreased skin integrity,  Decreased strength, Decreased activity tolerance, Decreased balance, Decreased mobility, Difficulty walking, Cardiopulmonary status limiting activity, Decreased range of motion  Visit Diagnosis: Muscle weakness (generalized)     Problem List Patient  Active Problem List   Diagnosis Date Noted  . DOE (dyspnea on exertion) 12/12/2019   12:24 PM, 01/07/20 Jerene Pitch, DPT Physical Therapy with [facility]  [phone] office  [facility] [address] [place], [place], [address] Phone: [phone]   Fax:  [phone]  Name: Master Touchet MRN: [id number] Date of Birth: Sep 05, 1960

## 2020-01-12 ENCOUNTER — Ambulatory Visit (HOSPITAL_COMMUNITY): Payer: Medicaid Other | Admitting: Physical Therapy

## 2020-01-12 ENCOUNTER — Other Ambulatory Visit: Payer: Self-pay

## 2020-01-12 ENCOUNTER — Encounter (HOSPITAL_COMMUNITY): Payer: Self-pay | Admitting: Physical Therapy

## 2020-01-12 DIAGNOSIS — R262 Difficulty in walking, not elsewhere classified: Secondary | ICD-10-CM

## 2020-01-12 DIAGNOSIS — M6281 Muscle weakness (generalized): Secondary | ICD-10-CM

## 2020-01-12 NOTE — Therapy (Signed)
[facility] [place], [place], [address] Phone: [phone]   Fax:  [phone]  Physical Therapy Treatment  Patient Details  Name: Jerol Rufener MRN: [id number] Date of Birth: [birth year] Referring Provider (PT): Abran Richard   Encounter Date: 01/12/2020  PT End of Session - 01/12/20 1150    Visit Number  6    Number of Visits  16    Date for PT Re-Evaluation  01/28/20   eval 12/17/19   Authorization Type  medicaid primary    Authorization Time Period  3 visits approved 01/06/20 - 01/19/20    Authorization - Visit Number  2    Authorization - Number of Visits  3    Progress Note Due on Visit  10    PT Start Time  1118    PT Stop Time  1157    PT Time Calculation (min)  39 min    Activity Tolerance  Patient limited by fatigue    Behavior During Therapy  [facility] for tasks assessed/performed       History reviewed. No pertinent past medical history.  History reviewed. No pertinent surgical history.  There were no vitals filed for this visit.  Subjective Assessment - 01/12/20 1121    Subjective  Patient reported that he is feeling tired and still has been SOB. Reported that his MD is aware. He does have his defibrillator today.    Pertinent History  SOB, systolic congestiv failure, class 4 heart failure    Patient Stated Goals  to be able to walk further    Currently in Pain?  No/denies                        [facility] Adult PT Treatment/Exercise - 01/12/20 0001      Ambulation/Gait   Ambulation Distance (Feet)  150 Feet   with 1 standing rest break   Assistive device  Straight cane    Gait Comments  SOB following 80 feet      Knee/Hip Exercises: Standing   Heel Raises  Both;2 sets;10 reps;2 seconds    Functional Squat  2 sets;10 reps    Functional Squat Limitations  Bil HHA      Knee/Hip Exercises: Seated   [employer]el Arc Quad  AROM;Both;3 sets;10 reps   5 second holds with breathing   Sit to Sand   without UE support;2 sets   6 reps. Elevated table            PT Education - 01/12/20 1243    Education Details  Discussed taking rest breaks as needed when SOB occurs at home.    Person(s) Educated  Patient    Methods  Explanation    Comprehension  Verbalized understanding       PT Short Term Goals - 01/05/20 1136      PT SHORT TERM GOAL #1   Title  Patient will be independent in self management strategies to improve quality of life and functional outcomes. Patient will be independent in self management strategies to improve quality of life and functional outcomes.    Baseline  compliance with HEP    Time  4    Period  Weeks    Status  On-going    Target Date  01/14/20      PT SHORT TERM GOAL #2   Title  Patient will be able to walk at least 226 feet in 2 minutes with LRAD to demonstrate improved  functional endurance.    Baseline  5/17 126 feet in 2 minutes with walking cane. 226 feet in 3 minutes and 32 seconds    Time  4    Period  Weeks    Status  On-going    Target Date  01/14/20      PT SHORT TERM GOAL #3   Title  Patient will report at least 25% improvement in overall symptoms and/or functional ability.    Baseline  01/05/20 60% better    Time  4    Period  Weeks    Status  Achieved    Target Date  01/14/20        PT Cryer Term Goals - 01/05/20 1136      PT Lacerte TERM GOAL #1   Title  Patient will be able to ambulate for at least 10 minutes (with standing rest breaks as needed) to demonstrate improved functional endurance    Baseline  5/17 currently able to stand and walk 1-2 minutes depends on the day    Time  8    Period  Weeks    Status  On-going      PT Cooler TERM GOAL #2   Title  Patient will be able to perfrom 5x STS in <30 seconds to demonstrate improved functional endurance    Baseline  5/17 28.2 seconds    Time  8    Period  Weeks    Status  Achieved      PT Grunder TERM GOAL #3   Title  Patient will report at least 50% improvement in overall  symptoms and/or functional ability.    Baseline  5/17 60% improvement    Time  8    Period  Weeks    Status  Achieved            Plan - 01/12/20 1253    Clinical Impression Statement  Patient with reported fatigue due to not sleeping well the night prior. Patient also reported continued SOB. Took patient's BP at the start of the session and it was found to be 109/76 and patient's SpO2 found to be 98%. Patient's SpO2 measured throughout session continued to be 98% or above. Patient required frequent rest breaks throughout session. Added heel raises and functional squats this session. Also encouraged patient to continue attempting ambulation at home and taking rest breaks as needed to build up endurance.    Personal Factors and Comorbidities  Comorbidity 1;Comorbidity 2;Comorbidity 3+    Comorbidities  shortness of breath, systolic congestiv failure, class 4 heart failure    Examination-Activity Limitations  Bathing;Squat;Stairs;Stand;Transfers;Lift;Locomotion Level;Carry;Bend    Examination-Participation Restrictions  Community Activity;Cleaning;Meal Prep    Rehab Potential  Good    PT Frequency  2x / week    PT Duration  8 weeks    PT Treatment/Interventions  ADLs/Self Care Home Management;Cryotherapy;Electrical Stimulation;Moist Heat;Balance training;Therapeutic exercise;Therapeutic activities;Functional mobility training;Gait training;Stair training;DME Instruction;Neuromuscular re-education;Patient/family education;Manual techniques;Energy conservation;Passive range of motion    PT Next Visit Plan  Check goals and request more appointments for insurance. MAKE SURE PATIENT HAS DEFIBRILATER, monitor BP, walking/endurance training    PT Home Exercise Plan  4/26 walking program - start at 2 minutes, STS - start at 2; 5/12 3 way hip standing    Consulted and Agree with Plan of Care  Patient       Patient will benefit from skilled therapeutic intervention in order to improve the following  deficits and impairments:  Abnormal gait, Decreased endurance, Decreased skin  integrity, Decreased strength, Decreased activity tolerance, Decreased balance, Decreased mobility, Difficulty walking, Cardiopulmonary status limiting activity, Decreased range of motion  Visit Diagnosis: Muscle weakness (generalized)  Difficulty in walking, not elsewhere classified     Problem List Patient Active Problem List   Diagnosis Date Noted  . DOE (dyspnea on exertion) 12/12/2019   Clarene Critchley PT, DPT 12:55 PM, 01/12/20 [place] [place], [place], [address] Phone: [phone]   Fax:  [phone]  Name: Esker Dever MRN: [id number] Date of Birth: 10-29-1960

## 2020-01-13 NOTE — Progress Notes (Signed)
ATC, NA and no VM set up

## 2020-01-14 ENCOUNTER — Other Ambulatory Visit: Payer: Self-pay

## 2020-01-14 ENCOUNTER — Ambulatory Visit (HOSPITAL_COMMUNITY): Payer: Medicaid Other | Admitting: Physical Therapy

## 2020-01-14 DIAGNOSIS — M6281 Muscle weakness (generalized): Secondary | ICD-10-CM

## 2020-01-14 DIAGNOSIS — R262 Difficulty in walking, not elsewhere classified: Secondary | ICD-10-CM

## 2020-01-14 NOTE — Therapy (Signed)
[facility] [place], [place], [address] Phone: [phone]   Fax:  [phone]  Physical Therapy Treatment  Patient Details  Name: Darryl Frye MRN: [id number] Date of Birth: [birth year] Referring Provider (PT): Abran Richard   Encounter Date: 01/14/2020  PT End of Session - 01/14/20 1159    Visit Number  7    Number of Visits  16    Date for PT Re-Evaluation  01/28/20   eval 12/17/19   Authorization Type  medicaid primary    Authorization Time Period  ; 5/26 12 more visits requested    Authorization - Visit Number  3    Authorization - Number of Visits  3    Progress Note Due on Visit  17    PT Start Time  1137    PT Stop Time  1215    PT Time Calculation (min)  38 min    Activity Tolerance  Patient limited by fatigue    Behavior During Therapy  [facility] for tasks assessed/performed       No past medical history on file.  No past surgical history on file.  There were no vitals filed for this visit.  Subjective Assessment - 01/14/20 1140    Subjective  Patient reported that he is feeling tired and still has been SOB. Reported that his MD is aware. He does have his defibrillator today. He goes to his heart MD Friday.    Pertinent History  SOB, systolic congestiv failure, class 4 heart failure    Limitations  Standing;Walking;House hold activities    How Benedetti can you stand comfortably?  Able to stand for 3 minutes was 1-2    How Keddy can you walk comfortably?  PT is only able to walk for about 1-  2 minutes prior to becoming SOB    Patient Stated Goals  to be able to walk further    Currently in Pain?  No/denies         [facility] PT Assessment - 01/14/20 0001      Assessment   Medical Diagnosis  deconditioning    Referring Provider (PT)  Abran Richard    Prior Therapy  in hospital      Precautions   Precautions  Fall;Other (comment)   cardiac     [place] residence    Living  Arrangements  Alone    Available Help at Discharge  Family    Type of [place] to enter    Entrance Stairs-Number of Steps  3    Axis  One level    Springer seat;Walker - 2 wheels;Cane - single point      Prior Function   Level of Independence  Independent      Cognition   Overall Cognitive Status  Within Functional Limits for tasks assessed      Observation/Other Assessments   Observations  seated BP at start 102/70, after walking 110/80 BP       Transfers   Five time sit to stand comments   23.77 seconds; 4/17 was 28.2 initially 1:17 with deep breathing throughout using cane      Ambulation/Gait   Ambulation/Gait  Yes    Ambulation/Gait Assistance  6: Modified independent (Device/Increase time);5: Supervision    Ambulation Distance (Feet)  198 Feet    Assistive device  --  walking stick   Gait Pattern  Decreased arm swing - right;Decreased arm swing - left;Decreased stride length    Gait velocity  decreased    Gait Comments  3MW -   3 standing rest breaks, able to walk 334 in 5 minutes was  226 feet in 5:00      Balance   Balance Assessed  Yes                    [facility] Adult PT Treatment/Exercise - 01/14/20 0001      Knee/Hip Exercises: Aerobic   Nustep  7 minutes, chair position 10, no resistance - cues to breath throughout      Knee/Hip Exercises: Standing   Heel Raises  Both;2 sets;15 reps    Functional Squat  15 reps    SLS  x3  10" max              PT Education - 01/14/20 1206    Education Details  The importance of diaphragmic breathing    Person(s) Educated  Patient    Methods  Explanation    Comprehension  Verbalized understanding       PT Short Term Goals - 01/14/20 1201      PT SHORT TERM GOAL #1   Title  Patient will be independent in self management strategies to improve quality of life and functional outcomes. Patient will be independent in self  management strategies to improve quality of life and functional outcomes.    Baseline  compliance with HEP    Time  4    Period  Weeks    Status  Achieved    Target Date  01/14/20      PT SHORT TERM GOAL #2   Title  Patient will be able to walk at least 226 feet in 2 minutes with LRAD to demonstrate improved functional endurance.    Baseline  5/17 126 feet in 2 minutes with walking cane. 226 feet in 3 minutes and 32 seconds    Time  4    Period  Weeks    Status  On-going    Target Date  01/14/20      PT SHORT TERM GOAL #3   Title  Patient will report at least 25% improvement in overall symptoms and/or functional ability.    Baseline  01/05/20 60% better    Time  4    Period  Weeks    Status  Achieved    Target Date  01/14/20        PT Haigh Term Goals - 01/05/20 1136      PT Kuper TERM GOAL #1   Title  Patient will be able to ambulate for at least 10 minutes (with standing rest breaks as needed) to demonstrate improved functional endurance    Baseline  5/17 currently able to stand and walk 1-2 minutes depends on the day    Time  8    Period  Weeks    Status  On-going      PT Lacorte TERM GOAL #2   Title  Patient will be able to perfrom 5x STS in <30 seconds to demonstrate improved functional endurance    Baseline  5/17 28.2 seconds    Time  8    Period  Weeks    Status  Achieved      PT Stanbery TERM GOAL #3   Title  Patient will report at least 50% improvement in overall symptoms and/or functional ability.  Baseline  5/17 60% improvement    Time  8    Period  Weeks    Status  Achieved              Patient will benefit from skilled therapeutic intervention in order to improve the following deficits and impairments:     Visit Diagnosis: Muscle weakness (generalized)  Difficulty in walking, not elsewhere classified     Problem List Patient Active Problem List   Diagnosis Date Noted  . DOE (dyspnea on exertion) 12/12/2019    Rayetta Humphrey, PT  CLT [phone] 01/14/2020, 12:13 PM  [facility] [address] [place], [place], [address] Phone: [phone]   Fax:  [phone]  Name: Isael Stille MRN: [id number] Date of Birth: 1961-01-11

## 2020-01-14 NOTE — Progress Notes (Signed)
Attempted to reach patient for the third time. Letter sent with results, recall in for 01/25/2020 with Dr. Melvyn Novas.

## 2020-01-21 ENCOUNTER — Ambulatory Visit (HOSPITAL_COMMUNITY): Payer: Medicaid Other | Attending: Internal Medicine | Admitting: Physical Therapy

## 2020-01-21 ENCOUNTER — Encounter (HOSPITAL_COMMUNITY): Payer: Self-pay | Admitting: Physical Therapy

## 2020-01-21 ENCOUNTER — Other Ambulatory Visit: Payer: Self-pay

## 2020-01-21 DIAGNOSIS — M6281 Muscle weakness (generalized): Secondary | ICD-10-CM | POA: Insufficient documentation

## 2020-01-21 DIAGNOSIS — R262 Difficulty in walking, not elsewhere classified: Secondary | ICD-10-CM | POA: Diagnosis present

## 2020-01-21 NOTE — Therapy (Signed)
[place], [place], [address] Phone: [phone]   Fax:  [phone]  Physical Therapy Treatment  Patient Details  Name: Darryl Frye MRN: [id number] Date of Birth: [birth year] Referring Provider (PT): Abran Richard   Encounter Date: 01/21/2020  PT End of Session - 01/21/20 1132    Visit Number  8    Number of Visits  16    Date for PT Re-Evaluation  01/28/20   eval 12/17/19   Authorization Type  medicaid primary    Authorization Time Period  12 visits approved from 01/20/20 to 03/01/20    Authorization - Visit Number  1    Authorization - Number of Visits  12    Progress Note Due on Visit  17    PT Start Time  1132    PT Stop Time  1210    PT Time Calculation (min)  38 min    Activity Tolerance  Patient limited by fatigue    Behavior During Therapy  [facility] for tasks assessed/performed       History reviewed. No pertinent past medical history.  History reviewed. No pertinent surgical history.  There were no vitals filed for this visit.  Subjective Assessment - 01/21/20 1154    Subjective  States he couldn't sleep well last night because he couldn't breath as he was short of breath. States he saw his MD on Friday and they switched up his medications. States he has been taking his blood pressure everyday except this morning as he was rushing.    Pertinent History  SOB, systolic congestiv failure, class 4 heart failure    Limitations  Standing;Walking;House hold activities    How Hoeger can you stand comfortably?  Able to stand for 3 minutes was 1-2    How Scogin can you walk comfortably?  PT is only able to walk for about 1-  2 minutes prior to becoming SOB    Patient Stated Goals  to be able to walk further         [facility] PT Assessment - 01/21/20 0001      Assessment   Medical Diagnosis  deconditioning    Referring Provider (PT)  Abran Richard                    [facility] Adult PT Treatment/Exercise -  01/21/20 0001      Ambulation/Gait   Ambulation/Gait  Yes    Ambulation Distance (Feet)  226 Feet    Assistive device  Straight cane    Gait velocity  decreased    Gait Comments  4 seated rest breaks - very fatigeud vitals monitored      Knee/Hip Exercises: Seated   Other Seated Knee/Hip Exercises  lateral stepping at table x5 B              PT Education - 01/21/20 1157    Education Details  educated patient on good days/bad days and how sleep can affect this. Discussed sleeping postures/positions that will help with feeling short of breath (head of bed elevated, sleep in recliner or prop pillows under self to elevate head with sleeping).    Person(s) Educated  Patient    Methods  Explanation    Comprehension  Verbalized understanding       PT Short Term Goals - 01/14/20 1201      PT SHORT TERM GOAL #1   Title  Patient will be independent in self management strategies to improve  quality of life and functional outcomes. Patient will be independent in self management strategies to improve quality of life and functional outcomes.    Baseline  compliance with HEP    Time  4    Period  Weeks    Status  Achieved    Target Date  01/14/20      PT SHORT TERM GOAL #2   Title  Patient will be able to walk at least 226 feet in 2 minutes with LRAD to demonstrate improved functional endurance.    Baseline  5/17 126 feet in 2 minutes with walking cane. 226 feet in 3 minutes and 32 seconds    Time  4    Period  Weeks    Status  On-going    Target Date  01/14/20      PT SHORT TERM GOAL #3   Title  Patient will report at least 25% improvement in overall symptoms and/or functional ability.    Baseline  01/05/20 60% better    Time  4    Period  Weeks    Status  Achieved    Target Date  01/14/20        PT Hirschmann Term Goals - 01/05/20 1136      PT Morsch TERM GOAL #1   Title  Patient will be able to ambulate for at least 10 minutes (with standing rest breaks as needed) to demonstrate  improved functional endurance    Baseline  5/17 currently able to stand and walk 1-2 minutes depends on the day    Time  8    Period  Weeks    Status  On-going      PT Brun TERM GOAL #2   Title  Patient will be able to perfrom 5x STS in <30 seconds to demonstrate improved functional endurance    Baseline  5/17 28.2 seconds    Time  8    Period  Weeks    Status  Achieved      PT Tabar TERM GOAL #3   Title  Patient will report at least 50% improvement in overall symptoms and/or functional ability.    Baseline  5/17 60% improvement    Time  8    Period  Weeks    Status  Achieved            Plan - 01/21/20 1254    Clinical Impression Statement  Patient demonstrated increased difficulties and fatigueduring today's session. Longer rest breaks taken to make up for fatigue. No pain noted during session. Focused on education to improve sleep quality and then improve tolerance to activiities. Answered all quesitons prior to end of session. Will follow up with sleep quality next session.    Personal Factors and Comorbidities  Comorbidity 1;Comorbidity 2;Comorbidity 3+    Comorbidities  shortness of breath, systolic congestiv failure, class 4 heart failure    Examination-Activity Limitations  Bathing;Squat;Stairs;Stand;Transfers;Lift;Locomotion Level;Carry;Bend    Examination-Participation Restrictions  Community Activity;Cleaning;Meal Prep    Rehab Potential  Good    PT Frequency  2x / week    PT Duration  8 weeks    PT Treatment/Interventions  ADLs/Self Care Home Management;Cryotherapy;Electrical Stimulation;Moist Heat;Balance training;Therapeutic exercise;Therapeutic activities;Functional mobility training;Gait training;Stair training;DME Instruction;Neuromuscular re-education;Patient/family education;Manual techniques;Energy conservation;Passive range of motion    PT Next Visit Plan  f/u with sleep quality MAKE SURE PATIENT HAS DEFIBRILATER, monitor BP, walking/endurance training    PT  Home Exercise Plan  4/26 walking program - start at 2 minutes, STS -  start at 2; 5/12 3 way hip standing    Consulted and Agree with Plan of Care  Patient       Patient will benefit from skilled therapeutic intervention in order to improve the following deficits and impairments:  Abnormal gait, Decreased endurance, Decreased skin integrity, Decreased strength, Decreased activity tolerance, Decreased balance, Decreased mobility, Difficulty walking, Cardiopulmonary status limiting activity, Decreased range of motion  Visit Diagnosis: Muscle weakness (generalized)  Difficulty in walking, not elsewhere classified     Problem List Patient Active Problem List   Diagnosis Date Noted  . DOE (dyspnea on exertion) 12/12/2019    12:57 PM, 01/21/20 Jerene Pitch, DPT Physical Therapy with [facility]  [phone] office  [place] [address] [place], [place], [address] Phone: [phone]   Fax:  [phone]  Name: Darryl Frye MRN: [id number] Date of Birth: 05-Nov-1960

## 2020-01-23 ENCOUNTER — Ambulatory Visit (HOSPITAL_COMMUNITY): Payer: Medicaid Other | Admitting: Physical Therapy

## 2020-01-23 ENCOUNTER — Other Ambulatory Visit: Payer: Self-pay

## 2020-01-23 ENCOUNTER — Encounter (HOSPITAL_COMMUNITY): Payer: Self-pay | Admitting: Physical Therapy

## 2020-01-23 DIAGNOSIS — M6281 Muscle weakness (generalized): Secondary | ICD-10-CM

## 2020-01-23 DIAGNOSIS — R262 Difficulty in walking, not elsewhere classified: Secondary | ICD-10-CM

## 2020-01-23 NOTE — Therapy (Signed)
[place], [place], [address] Phone: [phone]   Fax:  [phone]  Physical Therapy Treatment  Patient Details  Name: Darryl Frye MRN: [id number] Date of Birth: [birth year] Referring Provider (PT): Abran Richard   Encounter Date: 01/23/2020  PT End of Session - 01/23/20 1131    Visit Number  9    Number of Visits  16    Date for PT Re-Evaluation  01/28/20   eval 12/17/19   Authorization Type  medicaid primary    Authorization Time Period  12 visits approved from 01/20/20 to 03/01/20    Authorization - Visit Number  2    Authorization - Number of Visits  12    Progress Note Due on Visit  17    PT Start Time  1132    PT Stop Time  1210    PT Time Calculation (min)  38 min    Activity Tolerance  Patient limited by fatigue    Behavior During Therapy  [facility] for tasks assessed/performed       History reviewed. No pertinent past medical history.  History reviewed. No pertinent surgical history.  There were no vitals filed for this visit.  Subjective Assessment - 01/23/20 1136    Subjective  Patient still not sleeping well, elevated bed about 7 inches, but patient is doing better, has had coffee this morning. Shortness of breath still about the same. Medications were changed up and he just started new medication yesterday, expected to possibly see benefit in a couple days. States he just ordered the ab doer 360 to start working out at home.    Pertinent History  SOB, systolic congestiv failure, class 4 heart failure    Limitations  Standing;Walking;House hold activities    How [place] can you stand comfortably?  Able to stand for 3 minutes was 1-2    How Lipsky can you walk comfortably?  PT is only able to walk for about 1-  2 minutes prior to becoming SOB    Patient Stated Goals  to be able to walk further    Currently in Pain?  No/denies         [facility] PT Assessment - 01/23/20 0001      Assessment   Medical  Diagnosis  deconditioning    Referring Provider (PT)  Abran Richard                    [facility] Adult PT Treatment/Exercise - 01/23/20 0001      Ambulation/Gait   Ambulation/Gait  Yes    Ambulation Distance (Feet)  226 Feet    Assistive device  Straight cane    Gait velocity  decreased    Gait Comments  2 seated rest breaks 5:55       Knee/Hip Exercises: Aerobic   Nustep  5 minutes, chair position 10, level 3 resistance - cues to breath throughout      Knee/Hip Exercises: Standing   Lateral Step Up  Both;4 sets;5 reps;Hand Hold: 1;Step Height: 4"    Forward Step Up  Both;4 sets;5 reps;Hand Hold: 1;Step Height: 4"      Knee/Hip Exercises: Seated   Other Seated Knee/Hip Exercises  breathing exercises focus on belly breathing thoruhgout session reps of 5                PT Short Term Goals - 01/14/20 1201      PT SHORT TERM GOAL #1   Title  Patient will be independent in self management strategies to improve quality of life and functional outcomes. Patient will be independent in self management strategies to improve quality of life and functional outcomes.    Baseline  compliance with HEP    Time  4    Period  Weeks    Status  Achieved    Target Date  01/14/20      PT SHORT TERM GOAL #2   Title  Patient will be able to walk at least 226 feet in 2 minutes with LRAD to demonstrate improved functional endurance.    Baseline  5/17 126 feet in 2 minutes with walking cane. 226 feet in 3 minutes and 32 seconds    Time  4    Period  Weeks    Status  On-going    Target Date  01/14/20      PT SHORT TERM GOAL #3   Title  Patient will report at least 25% improvement in overall symptoms and/or functional ability.    Baseline  01/05/20 60% better    Time  4    Period  Weeks    Status  Achieved    Target Date  01/14/20        PT [place] Term Goals - 01/05/20 1136      PT Boldin TERM GOAL #1   Title  Patient will be able to ambulate for at least 10 minutes (with  standing rest breaks as needed) to demonstrate improved functional endurance    Baseline  5/17 currently able to stand and walk 1-2 minutes depends on the day    Time  8    Period  Weeks    Status  On-going      PT Feenstra TERM GOAL #2   Title  Patient will be able to perfrom 5x STS in <30 seconds to demonstrate improved functional endurance    Baseline  5/17 28.2 seconds    Time  8    Period  Weeks    Status  Achieved      PT Kowaleski TERM GOAL #3   Title  Patient will report at least 50% improvement in overall symptoms and/or functional ability.    Baseline  5/17 60% improvement    Time  8    Period  Weeks    Status  Achieved            Plan - 01/23/20 1131    Clinical Impression Statement  Improved ability to walk noted today with only 2 rest breaks taken compared to 4. Continued shortness of breath noted. Breathing exercises performed throughout session. Continued with LE strengthening and endurance but fatigue noted. Will  continue to focus on breath work and exercises as tolerated. Instructed patient to elevate head of bead even higher.    Personal Factors and Comorbidities  Comorbidity 1;Comorbidity 2;Comorbidity 3+    Comorbidities  shortness of breath, systolic congestiv failure, class 4 heart failure    Examination-Activity Limitations  Bathing;Squat;Stairs;Stand;Transfers;Lift;Locomotion Level;Carry;Bend    Examination-Participation Restrictions  Community Activity;Cleaning;Meal Prep    Rehab Potential  Good    PT Frequency  2x / week    PT Duration  8 weeks    PT Treatment/Interventions  ADLs/Self Care Home Management;Cryotherapy;Electrical Stimulation;Moist Heat;Balance training;Therapeutic exercise;Therapeutic activities;Functional mobility training;Gait training;Stair training;DME Instruction;Neuromuscular re-education;Patient/family education;Manual techniques;Energy conservation;Passive range of motion    PT Next Visit Plan  f/u with sleep quality MAKE SURE PATIENT HAS  DEFIBRILATER, monitor BP, walking/endurance training    PT  Home Exercise Plan  4/26 walking program - start at 2 minutes, STS - start at 2; 5/12 3 way hip standing    Consulted and Agree with Plan of Care  Patient       Patient will benefit from skilled therapeutic intervention in order to improve the following deficits and impairments:  Abnormal gait, Decreased endurance, Decreased skin integrity, Decreased strength, Decreased activity tolerance, Decreased balance, Decreased mobility, Difficulty walking, Cardiopulmonary status limiting activity, Decreased range of motion  Visit Diagnosis: Muscle weakness (generalized)  Difficulty in walking, not elsewhere classified     Problem List Patient Active Problem List   Diagnosis Date Noted  . DOE (dyspnea on exertion) 12/12/2019    12:23 PM, 01/23/20 Jerene Pitch, DPT Physical Therapy with [facility]  [phone] office  [place] [address] [place], [place], [address] Phone: [phone]   Fax:  [phone]  Name: Fumio Vandam MRN: [id number] Date of Birth: 11-18-1960

## 2020-01-26 ENCOUNTER — Encounter (HOSPITAL_COMMUNITY): Payer: Self-pay | Admitting: Physical Therapy

## 2020-01-26 ENCOUNTER — Ambulatory Visit (HOSPITAL_COMMUNITY): Payer: Medicaid Other | Admitting: Physical Therapy

## 2020-01-26 ENCOUNTER — Other Ambulatory Visit: Payer: Self-pay

## 2020-01-26 DIAGNOSIS — R262 Difficulty in walking, not elsewhere classified: Secondary | ICD-10-CM

## 2020-01-26 DIAGNOSIS — M6281 Muscle weakness (generalized): Secondary | ICD-10-CM | POA: Diagnosis not present

## 2020-01-26 NOTE — Therapy (Signed)
[place], [place], [address] Phone: [phone]   Fax:  [phone]  Physical Therapy Treatment  Patient Details  Name: Darryl Frye MRN: [id number] Date of Birth: 04-19-1961 Referring Provider (PT): Abran Richard   Encounter Date: 01/26/2020  PT End of Session - 01/26/20 1052    Visit Number  10    Number of Visits  16    Date for PT Re-Evaluation  01/28/20   eval 12/17/19   Authorization Type  medicaid primary    Authorization Time Period  12 visits approved from 01/20/20 to 03/01/20    Authorization - Visit Number  3    Authorization - Number of Visits  12    Progress Note Due on Visit  17    PT Start Time  1128    PT Stop Time  1211    PT Time Calculation (min)  43 min    Activity Tolerance  Patient limited by fatigue    Behavior During Therapy  [facility] for tasks assessed/performed       History reviewed. No pertinent past medical history.  History reviewed. No pertinent surgical history.  There were no vitals filed for this visit.  Subjective Assessment - 01/26/20 1129    Subjective  States shortness of breath is a little better.    Pertinent History  SOB, systolic congestiv failure, class 4 heart failure    Limitations  Standing;Walking;House hold activities    How Valencia can you stand comfortably?  Able to stand for 3 minutes was 1-2    How Sliva can you walk comfortably?  PT is only able to walk for about 1-  2 minutes prior to becoming SOB    Patient Stated Goals  to be able to walk further    Currently in Pain?  No/denies         [facility] PT Assessment - 01/26/20 0001      Assessment   Medical Diagnosis  deconditioning    Referring Provider (PT)  Abran Richard    Next MD Visit  02/12/20                    [facility] Adult PT Treatment/Exercise - 01/26/20 0001      Ambulation/Gait   Ambulation/Gait  Yes    Stairs  Yes    Stairs Assistance  6: Modified independent (Device/Increase time)     Stair Management Technique  One rail Right    Number of Stairs  7    Height of Stairs  4    Gait Comments  stairs x10 with rest breaks in between       Knee/Hip Exercises: Aerobic   Nustep  5 minutes, chair position 10, level 3 resistance - cues to breath throughout      Knee/Hip Exercises: Standing   Other Standing Knee Exercises  backwards steeping 4x5 B with walking stick     Other Standing Knee Exercises  lateral stepping with cane in mini squat - 4 steps B 5 sets       Knee/Hip Exercises: Seated   Sit to Sand  5 reps   with shoulder press with walking stick, 4 sets              PT Short Term Goals - 01/14/20 1201      PT SHORT TERM GOAL #1   Title  Patient will be independent in self management strategies to improve quality of life and functional outcomes.  Patient will be independent in self management strategies to improve quality of life and functional outcomes.    Baseline  compliance with HEP    Time  4    Period  Weeks    Status  Achieved    Target Date  01/14/20      PT SHORT TERM GOAL #2   Title  Patient will be able to walk at least 226 feet in 2 minutes with LRAD to demonstrate improved functional endurance.    Baseline  5/17 126 feet in 2 minutes with walking cane. 226 feet in 3 minutes and 32 seconds    Time  4    Period  Weeks    Status  On-going    Target Date  01/14/20      PT SHORT TERM GOAL #3   Title  Patient will report at least 25% improvement in overall symptoms and/or functional ability.    Baseline  01/05/20 60% better    Time  4    Period  Weeks    Status  Achieved    Target Date  01/14/20        PT [place]vel Term Goals - 01/05/20 1136      PT Codd TERM GOAL #1   Title  Patient will be able to ambulate for at least 10 minutes (with standing rest breaks as needed) to demonstrate improved functional endurance    Baseline  5/17 currently able to stand and walk 1-2 minutes depends on the day    Time  8    Period  Weeks    Status   On-going      PT Felling TERM GOAL #2   Title  Patient will be able to perfrom 5x STS in <30 seconds to demonstrate improved functional endurance    Baseline  5/17 28.2 seconds    Time  8    Period  Weeks    Status  Achieved      PT Flemings TERM GOAL #3   Title  Patient will report at least 50% improvement in overall symptoms and/or functional ability.    Baseline  5/17 60% improvement    Time  8    Period  Weeks    Status  Achieved            Plan - 01/26/20 1212    Clinical Impression Statement  Improved tolerance to exercises noted on this date. No pain but continued shortness of breath. Fatigue end of session. Will continue to work standing endurance and LE strength as tolerated.    Personal Factors and Comorbidities  Comorbidity 1;Comorbidity 2;Comorbidity 3+    Comorbidities  shortness of breath, systolic congestiv failure, class 4 heart failure    Examination-Activity Limitations  Bathing;Squat;Stairs;Stand;Transfers;Lift;Locomotion Level;Carry;Bend    Examination-Participation Restrictions  Community Activity;Cleaning;Meal Prep    Rehab Potential  Good    PT Frequency  2x / week    PT Duration  8 weeks    PT Treatment/Interventions  ADLs/Self Care Home Management;Cryotherapy;Electrical Stimulation;Moist Heat;Balance training;Therapeutic exercise;Therapeutic activities;Functional mobility training;Gait training;Stair training;DME Instruction;Neuromuscular re-education;Patient/family education;Manual techniques;Energy conservation;Passive range of motion    PT Next Visit Plan  LE strength and endurance  MAKE SURE PATIENT HAS DEFIBRILATER, monitor BP, walking/endurance training    PT Home Exercise Plan  4/26 walking program - start at 2 minutes, STS - start at 2; 5/12 3 way hip standing    Consulted and Agree with Plan of Care  Patient       Patient  will benefit from skilled therapeutic intervention in order to improve the following deficits and impairments:  Abnormal gait,  Decreased endurance, Decreased skin integrity, Decreased strength, Decreased activity tolerance, Decreased balance, Decreased mobility, Difficulty walking, Cardiopulmonary status limiting activity, Decreased range of motion  Visit Diagnosis: Muscle weakness (generalized)  Difficulty in walking, not elsewhere classified     Problem List Patient Active Problem List   Diagnosis Date Noted  . DOE (dyspnea on exertion) 12/12/2019    12:13 PM, 01/26/20 Jerene Pitch, DPT Physical Therapy with [facility]  [phone] office  [place] [address] [place], [place], [address] Phone: [phone]   Fax:  [phone]  Name: Darryl Frye MRN: [id number] Date of Birth: 11/03/1960

## 2020-01-28 ENCOUNTER — Other Ambulatory Visit: Payer: Self-pay

## 2020-01-28 ENCOUNTER — Encounter (HOSPITAL_COMMUNITY): Payer: Self-pay | Admitting: Physical Therapy

## 2020-01-28 ENCOUNTER — Ambulatory Visit (HOSPITAL_COMMUNITY): Payer: Medicaid Other | Admitting: Physical Therapy

## 2020-01-28 DIAGNOSIS — R262 Difficulty in walking, not elsewhere classified: Secondary | ICD-10-CM

## 2020-01-28 DIAGNOSIS — M6281 Muscle weakness (generalized): Secondary | ICD-10-CM | POA: Diagnosis not present

## 2020-01-28 NOTE — Therapy (Signed)
[place], [place], [address] Phone: [phone]   Fax:  [phone]  Physical Therapy Treatment  Patient Details  Name: Darryl Frye MRN: [id number] Date of Birth: [birth year] Referring Provider (PT): Abran Richard   Encounter Date: 01/28/2020  PT End of Session - 01/28/20 1132    Visit Number  11    Number of Visits  16    Date for PT Re-Evaluation  01/28/20   eval 12/17/19   Authorization Type  medicaid primary    Authorization Time Period  12 visits approved from 01/20/20 to 03/01/20    Authorization - Visit Number  4    Authorization - Number of Visits  12    Progress Note Due on Visit  17    PT Start Time  1130    PT Stop Time  1208    PT Time Calculation (min)  38 min    Activity Tolerance  Patient limited by fatigue    Behavior During Therapy  [facility] for tasks assessed/performed       History reviewed. No pertinent past medical history.  History reviewed. No pertinent surgical history.  There were no vitals filed for this visit.  Subjective Assessment - 01/28/20 1131    Subjective  STates the sleep is about the same and the shortness of breath is a little worse then last time. States his shortness of breaeth feels about 8.5/10    Pertinent History  SOB, systolic congestiv failure, class 4 heart failure    Limitations  Standing;Walking;House hold activities    How [place] can you stand comfortably?  Able to stand for 3 minutes was 1-2    How Caggiano can you walk comfortably?  PT is only able to walk for about 1-  2 minutes prior to becoming SOB    Patient Stated Goals  to be able to walk further    Currently in Pain?  No/denies         [facility] PT Assessment - 01/28/20 0001      Assessment   Medical Diagnosis  deconditioning    Referring Provider (PT)  Abran Richard                    [facility] Adult PT Treatment/Exercise - 01/28/20 0001      Ambulation/Gait   Ambulation/Gait  Yes    Ambulation/Gait Assistance  6: Modified independent (Device/Increase time);5: Supervision    Ambulation Distance (Feet)  226 Feet    Assistive device  Straight cane    Gait velocity  decreased    Stairs  Yes    Stairs Assistance  6: Modified independent (Device/Increase time)    Stair Management Technique  One rail Right    Number of Stairs  --   4   Height of Stairs  7    Gait Comments  1 seated rest breaks total 3:17; x10 reps - rest breaks in between 2 sets of stairs      Knee/Hip Exercises: Machines for Strengthening   Other Machine  lat pull at machine with 1 weight plate -focus on breathing - 4x10 ; backwards walking with straight bar for anterior pull - 1 weight plate x5       Knee/Hip Exercises: Seated   Other Seated Knee/Hip Exercises  leg press #2 plate - controlled movements 3x10 -               PT Short Term Goals - 01/14/20 1201  PT SHORT TERM GOAL #1   Title  Patient will be independent in self management strategies to improve quality of life and functional outcomes. Patient will be independent in self management strategies to improve quality of life and functional outcomes.    Baseline  compliance with HEP    Time  4    Period  Weeks    Status  Achieved    Target Date  01/14/20      PT SHORT TERM GOAL #2   Title  Patient will be able to walk at least 226 feet in 2 minutes with LRAD to demonstrate improved functional endurance.    Baseline  5/17 126 feet in 2 minutes with walking cane. 226 feet in 3 minutes and 32 seconds    Time  4    Period  Weeks    Status  On-going    Target Date  01/14/20      PT SHORT TERM GOAL #3   Title  Patient will report at least 25% improvement in overall symptoms and/or functional ability.    Baseline  01/05/20 60% better    Time  4    Period  Weeks    Status  Achieved    Target Date  01/14/20        PT Godar Term Goals - 01/05/20 1136      PT Fredin TERM GOAL #1   Title  Patient will be able to ambulate for at  least 10 minutes (with standing rest breaks as needed) to demonstrate improved functional endurance    Baseline  5/17 currently able to stand and walk 1-2 minutes depends on the day    Time  8    Period  Weeks    Status  On-going      PT Engh TERM GOAL #2   Title  Patient will be able to perfrom 5x STS in <30 seconds to demonstrate improved functional endurance    Baseline  5/17 28.2 seconds    Time  8    Period  Weeks    Status  Achieved      PT Orser TERM GOAL #3   Title  Patient will report at least 50% improvement in overall symptoms and/or functional ability.    Baseline  5/17 60% improvement    Time  8    Period  Weeks    Status  Achieved            Plan - 01/28/20 1132    Clinical Impression Statement  Improvement in walking noted on this date. able to complete lap around the gym (226 feet) in 3 minutes and 17 seconds compared to last time this task was performed which took 5 minutes and 55 seconds (two sessions ago). Despite increased report of shortness of breath at start of session patient able to perform higher level tasks and exercises.    Personal Factors and Comorbidities  Comorbidity 1;Comorbidity 2;Comorbidity 3+    Comorbidities  shortness of breath, systolic congestiv failure, class 4 heart failure    Examination-Activity Limitations  Bathing;Squat;Stairs;Stand;Transfers;Lift;Locomotion Level;Carry;Bend    Examination-Participation Restrictions  Community Activity;Cleaning;Meal Prep    Rehab Potential  Good    PT Frequency  2x / week    PT Duration  8 weeks    PT Treatment/Interventions  ADLs/Self Care Home Management;Cryotherapy;Electrical Stimulation;Moist Heat;Balance training;Therapeutic exercise;Therapeutic activities;Functional mobility training;Gait training;Stair training;DME Instruction;Neuromuscular re-education;Patient/family education;Manual techniques;Energy conservation;Passive range of motion    PT Next Visit Plan  LE strength and endurance  MAKE  SURE PATIENT HAS DEFIBRILATER, monitor BP, walking/endurance training    PT Home Exercise Plan  4/26 walking program - start at 2 minutes, STS - start at 2; 5/12 3 way hip standing    Consulted and Agree with Plan of Care  Patient       Patient will benefit from skilled therapeutic intervention in order to improve the following deficits and impairments:  Abnormal gait, Decreased endurance, Decreased skin integrity, Decreased strength, Decreased activity tolerance, Decreased balance, Decreased mobility, Difficulty walking, Cardiopulmonary status limiting activity, Decreased range of motion  Visit Diagnosis: Muscle weakness (generalized)  Difficulty in walking, not elsewhere classified     Problem List Patient Active Problem List   Diagnosis Date Noted  . DOE (dyspnea on exertion) 12/12/2019   12:10 PM, 01/28/20 Jerene Pitch, DPT Physical Therapy with [facility]  [phone] office  [place] [address] [place], [place], [address] Phone: [phone]   Fax:  [phone]  Name: Mohannad Olivero MRN: [id number] Date of Birth: May 15, 1961

## 2020-02-02 ENCOUNTER — Encounter (HOSPITAL_COMMUNITY): Payer: Self-pay | Admitting: Physical Therapy

## 2020-02-02 ENCOUNTER — Other Ambulatory Visit: Payer: Self-pay

## 2020-02-02 ENCOUNTER — Ambulatory Visit (HOSPITAL_COMMUNITY): Payer: Medicaid Other | Admitting: Physical Therapy

## 2020-02-02 DIAGNOSIS — M6281 Muscle weakness (generalized): Secondary | ICD-10-CM

## 2020-02-02 DIAGNOSIS — R262 Difficulty in walking, not elsewhere classified: Secondary | ICD-10-CM

## 2020-02-02 NOTE — Therapy (Signed)
[place], [place], [address] Phone: [phone]   Fax:  [phone]  Physical Therapy Treatment  Patient Details  Name: Darryl Frye MRN: [id number] Date of Birth: March 20, 1961 Referring Provider (PT): Abran Richard   Encounter Date: 02/02/2020   PT End of Session - 02/02/20 1130    Visit Number 12    Number of Visits 16    Date for PT Re-Evaluation 02/23/20   eval 12/17/19   Authorization Type medicaid primary    Authorization Time Period 12 visits approved from 01/20/20 to 03/01/20    Authorization - Visit Number 4    Authorization - Number of Visits 12    Progress Note Due on Visit 17    PT Start Time 1130    PT Stop Time 1208    PT Time Calculation (min) 38 min    Activity Tolerance Patient limited by fatigue    Behavior During Therapy [facility] for tasks assessed/performed           History reviewed. No pertinent past medical history.  History reviewed. No pertinent surgical history.  There were no vitals filed for this visit.   Subjective Assessment - 02/02/20 1241    Subjective States he is having swelling in both legs that started Friday. Reports yesterday it went down but then returned when he laid down. Reports symptoms have been about the same in regards to shortness of breath. Reports he has been monitoring his blood pressure and it has been within normal. Reports diet has been about the same.    Pertinent History SOB, systolic congestiv failure, class 4 heart failure    Limitations Standing;Walking;House hold activities    How [place] can you stand comfortably? Able to stand for 3 minutes was 1-2    How Tippin can you walk comfortably? PT is only able to walk for about 1-  2 minutes prior to becoming SOB    Patient Stated Goals to be able to walk further              [facility] PT Assessment - 02/02/20 0001      Assessment   Medical Diagnosis deconditioning      Observation/Other Assessments    Observations pitting edema 4+ bilaterally, BP 112/81 and HR 79      Transfers   Five time sit to stand comments  27.1 seconds                         [facility] Adult PT Treatment/Exercise - 02/02/20 0001      Ambulation/Gait   Ambulation/Gait Yes    Ambulation Distance (Feet) 128 Feet    Assistive device Straight cane    Gait Comments 2MW, 174ft in 47minutes                  PT Education - 02/02/20 1242    Education Details on change in symptoms, what swelling could mean and to follow up with Cardiac MD.    Terence Lux) Educated Patient    Methods Explanation    Comprehension Verbalized understanding            PT Short Term Goals - 02/02/20 1142      PT SHORT TERM GOAL #1   Title Patient will be independent in self management strategies to improve quality of life and functional outcomes. Patient will be independent in self management strategies to improve quality of life and functional outcomes.  Baseline compliance with HEP    Time 4    Period Weeks    Status Achieved    Target Date 01/14/20      PT SHORT TERM GOAL #2   Title Patient will be able to walk at least 226 feet in 2 minutes with LRAD to demonstrate improved functional endurance.    Baseline 5/17 126 feet in 2 minutes with walking cane. 226 feet in 3 minutes and 32 seconds; 6/14 128 feet in 2 minutes with walking cane and 158 feet in 3 minutes    Time 4    Period Weeks    Status On-going    Target Date 01/14/20      PT SHORT TERM GOAL #3   Title Patient will report at least 25% improvement in overall symptoms and/or functional ability.    Baseline 01/05/20 60% better    Time 4    Period Weeks    Status Achieved    Target Date 01/14/20             PT Flud Term Goals - 02/02/20 1148      PT Brandl TERM GOAL #1   Title Patient will be able to ambulate for at least 10 minutes (with standing rest breaks as needed) to demonstrate improved functional endurance    Baseline 5/17 currently  able to stand and walk 1-2 minutes depends on the day; 6/14 about 45 seconds to a minute    Time 8    Period Weeks    Status On-going      PT Kershaw TERM GOAL #2   Title Patient will be able to perfrom 5x STS in <30 seconds to demonstrate improved functional endurance    Baseline 5/17 28.2 seconds; 6/14 27.1 seconds    Time 8    Period Weeks    Status Achieved      PT Koenen TERM GOAL #3   Title Patient will report at least 50% improvement in overall symptoms and/or functional ability.    Baseline 5/17 60% improvement    Time 8    Period Weeks    Status Achieved                 Plan - 02/02/20 1248    Clinical Impression Statement Increased symptoms noted during today's session. Pitting edema bilaterally in ankles 4+. Vitals taken, BP 112/81 and HR 79. Educated patient on current symptoms and following up with cardiac MD. Extending POC to make up for missed visits secondary to improvements that have been made thus far. Will continue to work on remaining goals.    Personal Factors and Comorbidities Comorbidity 1;Comorbidity 2;Comorbidity 3+    Comorbidities shortness of breath, systolic congestiv failure, class 4 heart failure    Examination-Activity Limitations Bathing;Squat;Stairs;Stand;Transfers;Lift;Locomotion Level;Carry;Bend    Examination-Participation Restrictions Community Activity;Cleaning;Meal Prep    Rehab Potential Good    PT Frequency Other (comment)   4 visits over 3 week period   PT Duration 3 weeks    PT Treatment/Interventions ADLs/Self Care Home Management;Cryotherapy;Electrical Stimulation;Moist Heat;Balance training;Therapeutic exercise;Therapeutic activities;Functional mobility training;Gait training;Stair training;DME Instruction;Neuromuscular re-education;Patient/family education;Manual techniques;Energy conservation;Passive range of motion    PT Next Visit Plan LE strength and endurance  MAKE SURE PATIENT HAS DEFIBRILATER, monitor BP, walking/endurance  training    PT Home Exercise Plan 4/26 walking program - start at 2 minutes, STS - start at 2; 5/12 3 way hip standing    Consulted and Agree with Plan of Care Patient  Patient will benefit from skilled therapeutic intervention in order to improve the following deficits and impairments:  Abnormal gait, Decreased endurance, Decreased skin integrity, Decreased strength, Decreased activity tolerance, Decreased balance, Decreased mobility, Difficulty walking, Cardiopulmonary status limiting activity, Decreased range of motion  Visit Diagnosis: Muscle weakness (generalized) - Plan: PT plan of care cert/re-cert  Difficulty in walking, not elsewhere classified - Plan: PT plan of care cert/re-cert     Problem List Patient Active Problem List   Diagnosis Date Noted  . DOE (dyspnea on exertion) 12/12/2019   12:53 PM, 02/02/20 Jerene Pitch, DPT Physical Therapy with [facility]  [phone] office  [place] [address] [place], [place], [address] Phone: [phone]   Fax:  [phone]  Name: Darryl Frye MRN: [id number] Date of Birth: [birth year]

## 2020-02-04 ENCOUNTER — Other Ambulatory Visit: Payer: Self-pay

## 2020-02-04 ENCOUNTER — Ambulatory Visit (HOSPITAL_COMMUNITY): Payer: Medicaid Other

## 2020-02-04 ENCOUNTER — Encounter (HOSPITAL_COMMUNITY): Payer: Self-pay

## 2020-02-04 VITALS — BP 103/77 | HR 76

## 2020-02-04 DIAGNOSIS — M6281 Muscle weakness (generalized): Secondary | ICD-10-CM | POA: Diagnosis not present

## 2020-02-04 DIAGNOSIS — R262 Difficulty in walking, not elsewhere classified: Secondary | ICD-10-CM

## 2020-02-04 NOTE — Therapy (Signed)
[place], [place], [address] Phone: [phone]   Fax:  [phone]  Physical Therapy Treatment  Patient Details  Name: Darryl Frye MRN: [id number] Date of Birth: Dec 09, 1960 Referring Provider (PT): Abran Richard   Encounter Date: 02/04/2020   PT End of Session - 02/04/20 1208    Visit Number 13    Number of Visits 16    Date for PT Re-Evaluation 02/23/20   eval 12/17/19   Authorization Type medicaid primary    Authorization Time Period 12 visits approved from 01/20/20 to 03/01/20    Authorization - Visit Number 5    Authorization - Number of Visits 12    Progress Note Due on Visit 17    PT Start Time 1130    PT Stop Time 1213    PT Time Calculation (min) 43 min    Activity Tolerance Patient limited by fatigue    Behavior During Therapy [facility] for tasks assessed/performed           History reviewed. No pertinent past medical history.  History reviewed. No pertinent surgical history.  Vitals:   02/04/20 1136  BP: 103/77  Pulse: 76     Subjective Assessment - 02/04/20 1136    Subjective Pt reports swelling has resolved today.  Has MD apt with PCP Tuesday and heart MD on Thursday next week, has not called doctor yet.    Pertinent History SOB, systolic congestiv failure, class 4 heart failure    Patient Stated Goals to be able to walk further    Currently in Pain? No/denies                             [facility] Adult PT Treatment/Exercise - 02/04/20 0001      Knee/Hip Exercises: Machines for Strengthening   Cybex Leg Press 2Pl 2x 10    Other Machine lat pull at machine with 2Pl weight plate -focus on breathing - 4x10 ; backwards walking with straight bar for anterior pull - 2Pl weight plate       Knee/Hip Exercises: Standing   Other Standing Knee Exercises backwards steeping 4x5 B with walking stick     Other Standing Knee Exercises lateral stepping with cane in mini squat - 4 steps B 5  sets       Knee/Hip Exercises: Seated   Other Seated Knee/Hip Exercises deep breathing wiht mirror feed back and 1 arm on chest, 1 on stomach                  PT Education - 02/04/20 1207    Education Details Pt educated on shallow breathing and accessory mm on neck, educated importance of deep breathing to address SOB s/s    Person(s) Educated Patient    Methods Explanation;Verbal cues;Demonstration    Comprehension Verbalized understanding;Returned demonstration;Verbal cues required            PT Short Term Goals - 02/02/20 1142      PT SHORT TERM GOAL #1   Title Patient will be independent in self management strategies to improve quality of life and functional outcomes. Patient will be independent in self management strategies to improve quality of life and functional outcomes.    Baseline compliance with HEP    Time 4    Period Weeks    Status Achieved    Target Date 01/14/20      PT SHORT TERM GOAL #2  Title Patient will be able to walk at least 226 feet in 2 minutes with LRAD to demonstrate improved functional endurance.    Baseline 5/17 126 feet in 2 minutes with walking cane. 226 feet in 3 minutes and 32 seconds; 6/14 128 feet in 2 minutes with walking cane and 158 feet in 3 minutes    Time 4    Period Weeks    Status On-going    Target Date 01/14/20      PT SHORT TERM GOAL #3   Title Patient will report at least 25% improvement in overall symptoms and/or functional ability.    Baseline 01/05/20 60% better    Time 4    Period Weeks    Status Achieved    Target Date 01/14/20             PT Hendley Term Goals - 02/02/20 1148      PT Pajak TERM GOAL #1   Title Patient will be able to ambulate for at least 10 minutes (with standing rest breaks as needed) to demonstrate improved functional endurance    Baseline 5/17 currently able to stand and walk 1-2 minutes depends on the day; 6/14 about 45 seconds to a minute    Time 8    Period Weeks    Status  On-going      PT Kielty TERM GOAL #2   Title Patient will be able to perfrom 5x STS in <30 seconds to demonstrate improved functional endurance    Baseline 5/17 28.2 seconds; 6/14 27.1 seconds    Time 8    Period Weeks    Status Achieved      PT Goracke TERM GOAL #3   Title Patient will report at least 50% improvement in overall symptoms and/or functional ability.    Baseline 5/17 60% improvement    Time 8    Period Weeks    Status Achieved                 Plan - 02/04/20 1217    Clinical Impression Statement No reports of increased symptoms, no edema present Bil LE.  Vitals taken WNL.  Noted pt used accessory mm on neck when breathing.  Educated importance of deep breathing to address SOB, placed hand on chest/stomach to improve awareness and mirror for feedback wiht accessory mm.  Continued focus iwht gait endurance and LE strengthening, pt continues to be limited by fatigue with gait required standing/seated rest break ~30-45".  No reports of pain through session.    Personal Factors and Comorbidities Comorbidity 1;Comorbidity 2;Comorbidity 3+    Comorbidities shortness of breath, systolic congestiv failure, class 4 heart failure    Examination-Activity Limitations Bathing;Squat;Stairs;Stand;Transfers;Lift;Locomotion Level;Carry;Bend    Examination-Participation Restrictions Community Activity;Cleaning;Meal Prep    Stability/Clinical Decision Making Evolving/Moderate complexity    Rehab Potential Good    PT Frequency --   4 visits over 3 weeks   PT Duration 3 weeks    PT Treatment/Interventions ADLs/Self Care Home Management;Cryotherapy;Electrical Stimulation;Moist Heat;Balance training;Therapeutic exercise;Therapeutic activities;Functional mobility training;Gait training;Stair training;DME Instruction;Neuromuscular re-education;Patient/family education;Manual techniques;Energy conservation;Passive range of motion    PT Next Visit Plan LE strength and endurance  MAKE SURE PATIENT  HAS DEFIBRILATER, monitor BP, walking/endurance training    PT Home Exercise Plan 4/26 walking program - start at 2 minutes, STS - start at 2; 5/12 3 way hip standing           Patient will benefit from skilled therapeutic intervention in order to improve the following  deficits and impairments:  Abnormal gait, Decreased endurance, Decreased skin integrity, Decreased strength, Decreased activity tolerance, Decreased balance, Decreased mobility, Difficulty walking, Cardiopulmonary status limiting activity, Decreased range of motion  Visit Diagnosis: Difficulty in walking, not elsewhere classified  Muscle weakness (generalized)     Problem List Patient Active Problem List   Diagnosis Date Noted   DOE (dyspnea on exertion) 12/12/2019   Ihor Austin, LPTA/CLT; CBIS [phone] Aldona Lento 02/04/2020, 1:08 PM  [place], [place], [address] Phone: [phone]   Fax:  [phone]  Name: Chancellor Vanderloop MRN: [id number] Date of Birth: 09/25/1960

## 2020-02-09 ENCOUNTER — Ambulatory Visit (HOSPITAL_COMMUNITY): Payer: Medicaid Other | Admitting: Physical Therapy

## 2020-02-09 ENCOUNTER — Other Ambulatory Visit: Payer: Self-pay

## 2020-02-09 ENCOUNTER — Encounter (HOSPITAL_COMMUNITY): Payer: Self-pay | Admitting: Physical Therapy

## 2020-02-09 DIAGNOSIS — M6281 Muscle weakness (generalized): Secondary | ICD-10-CM

## 2020-02-09 DIAGNOSIS — R262 Difficulty in walking, not elsewhere classified: Secondary | ICD-10-CM

## 2020-02-09 NOTE — Therapy (Signed)
[facility] [address] [place], [place], [address] Phone: [phone]   Fax:  [phone]  Physical Therapy Treatment and Discharge Note  Patient Details  Name: Darryl Frye MRN: [id number] Date of Birth: 09-24-1960 Referring Provider (PT): Abran Richard  PHYSICAL THERAPY DISCHARGE SUMMARY  Visits from Start of Care: 14 Current functional level related to goals / functional outcomes: No short term goals and 1/3 [place] term goals met  Remaining deficits: Continued shortness of breath   Education / Equipment: See below   Plan: Patient agrees to discharge.  Patient goals were not met. Patient is being discharged due to lack of progress.  ?????        Encounter Date: 02/09/2020   PT End of Session - 02/09/20 1127    Visit Number 14    Number of Visits 16    Date for PT Re-Evaluation 02/23/20   eval 12/17/19   Authorization Type medicaid primary    Authorization Time Period 12 visits approved from 01/20/20 to 03/01/20    Authorization - Visit Number 6    Authorization - Number of Visits 12    Progress Note Due on Visit 17    PT Start Time 1128    PT Stop Time 1206    PT Time Calculation (min) 38 min    Activity Tolerance Patient limited by fatigue    Behavior During Therapy [facility] for tasks assessed/performed           History reviewed. No pertinent past medical history.  History reviewed. No pertinent surgical history.  There were no vitals filed for this visit.   Subjective Assessment - 02/09/20 1159    Subjective Patient reports he feels about 5% better since the start of physical therapy. States he is still tired, weak and short of breath. States he wants to do his exercises more but he is very short of breath which makes it difficult.    Pertinent History SOB, systolic congestiv failure, class 4 heart failure    Patient Stated Goals to be able to walk further    Currently in Pain? No/denies              [facility] PT  Assessment - 02/09/20 0001      Assessment   Medical Diagnosis deconditioning    Referring Provider (PT) Abran Richard      Transfers   Five time sit to stand comments  23.8   was 27.1     Ambulation/Gait   Ambulation/Gait Yes    Ambulation Distance (Feet) 226 Feet    Assistive device Straight cane    Gait velocity decreased                         [facility] Adult PT Treatment/Exercise - 02/09/20 0001      Ambulation/Gait   Stairs Yes    Stairs Assistance 6: Modified independent (Device/Increase time)    Stair Management Technique One rail Right    Number of Stairs 7    Height of Stairs 4    Gait Comments 4:56 for 226 feet with 1 seated and 1 standing rest break - 106 feet in 2 minutes; 2x6 reps on stairs                  PT Education - 02/09/20 1150    Education Details on current condition, on lack of progress, on walking program    Person(s) Educated Patient  Methods Explanation    Comprehension Verbalized understanding            PT Short Term Goals - 02/09/20 1133      PT SHORT TERM GOAL #1   Title Patient will be independent in self management strategies to improve quality of life and functional outcomes. Patient will be independent in self management strategies to improve quality of life and functional outcomes.    Baseline compliance with HEP    Time 4    Period Weeks    Status Partially Met    Target Date 01/14/20      PT SHORT TERM GOAL #2   Title Patient will be able to walk at least 226 feet in 2 minutes with LRAD to demonstrate improved functional endurance.    Baseline 5/17 126 feet in 2 minutes with walking cane. 226 feet in 3 minutes and 32 seconds; 6/14 128 feet in 2 minutes with walking cane and 158 feet in 3 minutes;    Time 4    Period Weeks    Status Not Met    Target Date 01/14/20      PT SHORT TERM GOAL #3   Title Patient will report at least 25% improvement in overall symptoms and/or functional ability.    Baseline  6/21 5% better    Time 4    Period Weeks    Status Not Met    Target Date 01/14/20             PT [place] Term Goals - 02/09/20 1133      PT Babino TERM GOAL #1   Title Patient will be able to ambulate for at least 10 minutes (with standing rest breaks as needed) to demonstrate improved functional endurance    Baseline 6/21 able to stand and walk for about 1 minute at a time    Time 8    Period Weeks    Status Not Met      PT Lando TERM GOAL #2   Title Patient will be able to perfrom 5x STS in <30 seconds to demonstrate improved functional endurance    Baseline 5/17 28.2 seconds; 6/14 27.1 seconds; 6/21 23.8 sec    Time 8    Period Weeks    Status Achieved      PT Rettig TERM GOAL #3   Title Patient will report at least 50% improvement in overall symptoms and/or functional ability.    Baseline 6/21 5% better    Time 8    Period Weeks    Status Not Met                 Plan - 02/09/20 1151    Clinical Impression Statement Patient has made minimal progress since start of therapy. Primary complaint of shortness of breath is limiting factor at this time. Improved in 5x sit to stand, but patient continues to have difficulties with walking for a minute or longer at a time. Pitting edema still noted bilaterally with left lower extremity worse than right.  Reviewed HEP and answered all questions on this date. Discussed walking program at home. Patient to discharge from PT to HEP at this time secondary to lack of progress made.    Personal Factors and Comorbidities Comorbidity 1;Comorbidity 2;Comorbidity 3+    Comorbidities shortness of breath, systolic congestiv failure, class 4 heart failure    Examination-Activity Limitations Bathing;Squat;Stairs;Stand;Transfers;Lift;Locomotion Level;Carry;Bend    Examination-Participation Restrictions Community Activity;Cleaning;Meal Prep    Stability/Clinical Decision Making Evolving/Moderate complexity  Rehab Potential Good    PT Frequency --    4 visits over 3 weeks   PT Duration 3 weeks    PT Treatment/Interventions ADLs/Self Care Home Management;Cryotherapy;Electrical Stimulation;Moist Heat;Balance training;Therapeutic exercise;Therapeutic activities;Functional mobility training;Gait training;Stair training;DME Instruction;Neuromuscular re-education;Patient/family education;Manual techniques;Energy conservation;Passive range of motion    PT Next Visit Plan pt to DC from PT to HEP    PT Home Exercise Plan 4/26 walking program - start at 2 minutes, STS - start at 2; 5/12 3 way hip standing    Recommended Other Services f/u with cardiologist - apt on 02/13/20    Consulted and Agree with Plan of Care Patient           Patient will benefit from skilled therapeutic intervention in order to improve the following deficits and impairments:  Abnormal gait, Decreased endurance, Decreased skin integrity, Decreased strength, Decreased activity tolerance, Decreased balance, Decreased mobility, Difficulty walking, Cardiopulmonary status limiting activity, Decreased range of motion  Visit Diagnosis: Difficulty in walking, not elsewhere classified  Muscle weakness (generalized)     Problem List Patient Active Problem List   Diagnosis Date Noted  . DOE (dyspnea on exertion) 12/12/2019    12:07 PM, 02/09/20 Jerene Pitch, DPT Physical Therapy with [facility]  [phone] office   [facility] [address] [place], [place], [address] Phone: [phone]   Fax:  [phone]  Name: Darryl Frye MRN: [id number] Date of Birth: 06-20-1961

## 2020-02-11 ENCOUNTER — Encounter (HOSPITAL_COMMUNITY): Payer: Medicaid Other | Admitting: Physical Therapy

## 2020-03-08 ENCOUNTER — Ambulatory Visit (INDEPENDENT_AMBULATORY_CARE_PROVIDER_SITE_OTHER): Payer: Medicaid Other | Admitting: Internal Medicine

## 2020-03-08 ENCOUNTER — Other Ambulatory Visit: Payer: Self-pay

## 2020-03-08 ENCOUNTER — Encounter: Payer: Self-pay | Admitting: Internal Medicine

## 2020-03-08 DIAGNOSIS — R0609 Other forms of dyspnea: Secondary | ICD-10-CM

## 2020-03-08 DIAGNOSIS — R0600 Dyspnea, unspecified: Secondary | ICD-10-CM

## 2020-03-08 NOTE — Progress Notes (Signed)
Darryl Frye, male    DOB: 02/21/1961, 59 y.o.   MRN: [id number]   Brief patient profile:  58 yobm quit smoking in 06/2019 with onset of sob x one month Darryl Frye with dx of chf/ pna/ pe / kidney failure temprorary HD > trach out and d/c p 100 days referred to pulmonary clinic in [place]  12/12/2019 by Dr  Abran Richard in [facility].   ADMIT DATE: 07/07/2019  DISCHARGE DATE: 10/25/2019  DISCHARGE DIAGNOSES:  Cardiogenic shock (HCC) Atrial fibrillation/flutter (HCC) Shock liver AKI (acute kidney injury) (HCC) CHF (congestive heart failure) (HCC) Prolonged Q-T interval on ECG DVT, bilateral lower limbs (HCC) DM (diabetes mellitus), type 2 (HCC) Acute respiratory failure ([place]) [place] / NOTABLE PROCEDURES: This is a pleasant 59 year old male with diabetes. He was admitted on 07/07/2019 He presented here from outside hospital with chief complaint of shortness of breath. At the outside ED he was in a flutter with 2-1 conduction with a rate of 150, he then became hypotensive and bradycardic, he was seen in acute respiratory distress, subsequently intubated after he failed BiPAP trial. CT of the chest showed no pulmonary embolism. He had elevated inflammatory markers. He was initially admitted for a fib/flutter with RVR, hypotension, acute hypoxic respiratory failure. Echocardiogram showed EF of 5 to 10%. His hospital course was [place] and complicated with cardiogenic shock, difficulty extubating requiring multiple intubations eventually ending in tracheostomy and PEG,Acute kidney injury requiring temporary CRRT, resolved and off dialysis and pneumonia/ MRSA pneumonia, Pseudomonas pneumonia, VRE bacteremia,Candida lusitaniaefungemia /  Acute hypoxic respiratory failure Difficulty extubating requiring multiple intubations eventually underwent tracheostomy on 08/08/2019, and PEG on 12/23/20202 He made significant progress, tracheostomy was downsized and  subsequently Decannulated on 10/23/2019 currently doing well on room air Prolonged hospital course, multiple rounds of intubation VRE bacteremia - resolved Candida Lusitaniae fungemia- resolved Cardiogenic shock, resolved Recommend follow up CT chest in 4-6 weeks with outpatient pulm follow-up Outpatient referral to pulmonary    History of paroxysmal A. fib Prolonged QTC-resolved Chronic HFrEF (EF 25 to 30% in 07/2019) Nonischemic cardiomyopathy Cardiogenic shock,resolved Continue Eliquis, lisinopril 2.5 mg daily, Lasix 20 mg daily, metoprolol 12.5 mg twice daily. Marginal blood pressure not able to add Aldactone at this time Repeated limited echo-EF is 20 to 25%, moderately enlarged RV Appreciate consult from heart failure team, he was given heart failure education and follow-up instruction. His LifeVest will be delivered today, and outpatient follow with cardiology for evaluation for ICD    Dysphagia, improved currently tolerating regular diet well PEG tube removed  Acute renal failure requiring hemodialysis - felt to be combination of ATN and fungal sepsis. Resolved. Now off HD.  Bilateral lower extremity DVTs Continue eliquis 75m BID     Chronic type 2 diabetes A1c 8.5 in 07/2019. Started on Metformin and glipizide Carb controlled diet   Right knee pain/swelling/tenderness, improved Recent VRE bacteremia and Candida infection, status post antibiotics/antifungals -Knee x-ray showed no evidence of acute fracture or dislocation. Noted to have moderate to large suprapatellar joint effusion -Uric acid WNL -CRP, ESR elevated -Orthopedics consulted, status post arthrocentesis 09/03/2018 -Fluid analysis negative for crystals, colchicine discontinued -Fluid culture negative, no growth. No surgical intervention needed per orthopedics. 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  Currently he is able to walk with a walker  His management was consulted to assist with medication and discharge His medications were filled from the lobby pharmacy He was provided coupon for Eliquis. His Medicaid is pending. If not approved in the next 1 month he will need to be transitioned to Coumadin. We had a Whan conversation about this with the patient and case management. He will need follow-up with his PCP in a week time. He will follow with PCP for routine checkups and medication refills  INSTRUCTIONS:  Disposition: Home Activity: As tolerated Diet/Tube feeding: Carb controlled cardiac diet  DISCHARGE MEDICATIONS:   Medication List   START taking these medications  apixaban 5 mg Tab tablet Commonly known as: ELIQUIS take 1 tablet by mouth two times daily Indications: Treatment of DVT/PE  furosemide 20 mg Tab Commonly known as: LASIX take 1 tablet by mouth every day  glipiZIDE 5 mg Tab Commonly known as: GLUCOTROL take 1 tablet by mouth every morning for 30 days  lisinopriL 2.5 mg Tab Commonly known as: PRINIVIL take 1 tablet by mouth every day  magnesium oxide 400 mg (241.3 mg magnesium) Tab Commonly known as: MAG-OX take 1 tablet by mouth two times daily  metFORMIN 500 mg Tab Commonly known as: GLUCOPHAGE take 1 tablet by mouth two times daily with meals  metoprolol tartrate 25 mg Tab Commonly known as: LOPRESSOR take 0.5 tablets by mouth two times daily  sertraline 50 mg Tab Commonly known as: ZOLOFT take 1 tablet by mouth every day    Where to Get Your Medications   You can get these medications from any pharmacy  Bring a paper prescription for each of these medications  apixaban 5 mg Tab tablet  furosemide 20 mg Tab  glipiZIDE 5 mg Tab  lisinopriL 2.5 mg Tab   magnesium oxide 400 mg (241.3 mg magnesium) Tab  metFORMIN 500 mg Tab  metoprolol tartrate 25 mg Tab  sertraline 50 mg Tab    History of Present Illness  12/12/2019  Pulmonary/ 1st office eval/Darryl Frye s/p one moderna/ second due May 4th 2021 Chief Complaint  Patient presents with   Pulmonary Consult    Referred by Dr Abran Richard. Pt c/o SOB since Nov 2020. He gets winded walking short distances such as room to room at home.   Dyspnea:  Room to room / rides a scooter for more than that - starting rehab in [place] in one week Cough: none  Sleep: bed is flat/ 2 pillows  SABA use: none  02 none  rec No pulmonary medications are needed  Please schedule a follow up office visit in 6 weeks, call sooner if needed - PFTs on return    03/08/2020  f/u ov/Darryl Frye re: ? Mild PF / chf now on entresto feels this has helped  / no copd on pfts 01/06/20  Chief Complaint  Patient presents with   Follow-up    Patient feels like breathing is better since last visit. Denies cough  Dyspnea: gets let off at door,  Quit pulmonary rehab very awkard gait with 4 pronged walker since admit  Cough: none  Sleeping: bed is flat, 2 pillows on side  SABA use: none  02: none    No obvious day to day or daytime variability or assoc excess/ purulent sputum or mucus plugs or hemoptysis or cp or chest tightness, subjective wheeze or overt sinus or hb symptoms.   Sleeping ok as above  without nocturnal  or early am exacerbation  of respiratory  c/o's or need for noct saba. Also denies any obvious fluctuation of symptoms with weather or environmental changes or other aggravating or alleviating factors except as outlined above   No unusual exposure hx or h/o childhood pna/ asthma or knowledge of premature birth.  Current Allergies, Complete Past Medical History, Past Surgical History, Family History, and Social History were reviewed in [employer] record.  ROS  The following are not active  complaints unless bolded Hoarseness, sore throat, dysphagia, dental problems, itching, sneezing,  nasal congestion or discharge of excess mucus or purulent secretions, ear ache,   fever, chills, sweats, unintended wt loss or wt gain, classically pleuritic or exertional cp,  orthopnea pnd or arm/hand swelling  or leg swelling, presyncope, palpitations, abdominal pain, anorexia, nausea, vomiting, diarrhea  or change in bowel habits or change in bladder habits, change in stools or change in urine, dysuria, hematuria,  rash, arthralgias, visual complaints, headache, numbness, weakness or ataxia or problems with walking or coordination,  change in mood or  memory.        Current Meds  Medication Sig   apixaban (ELIQUIS) 5 MG TABS tablet Take 5 mg by mouth 2 (two) times daily.   furosemide (LASIX) 20 MG tablet Take 20 mg by mouth daily.   glipiZIDE (GLUCOTROL) 5 MG tablet Take 5 mg by mouth daily before breakfast.   magnesium oxide (MAG-OX) 400 MG tablet Take 400 mg by mouth 2 (two) times daily as needed.   metFORMIN (GLUCOPHAGE) 500 MG tablet Take 500 mg by mouth 2 (two) times daily with a meal.   metolazone (ZAROXOLYN) 5 MG tablet Take 5 mg by mouth daily. Before Furosemide   metoprolol tartrate (LOPRESSOR) 25 MG tablet Take 25 mg by mouth daily.   sacubitril-valsartan (ENTRESTO) 24-26 MG Take 1 tablet by mouth 2 (two) times daily.   sertraline (ZOLOFT) 50 MG tablet Take 50 mg by mouth daily.   spironolactone (ALDACTONE) 25 MG tablet Take 25 mg by mouth daily.         Objective:       Wt Readings from Last 3 Encounters:  03/08/20 179 lb (81.2 kg)  12/12/19 176 lb (79.8 kg)     Vital signs reviewed - Note on arrival 03/08/2020  02 sats  98% on RA      amb bm > stated age/ awkward gait with 4 pronged walker   HEENT : pt wearing mask not removed for exam due to covid -19 concerns.    NECK :  without JVD/Nodes/TM/ nl carotid upstrokes bilaterally   LUNGS: no acc muscle use,  Nl  contour chest which is clear to A and P bilaterally without cough on insp or exp maneuvers   CV:  RRR  no s3 or murmur or increase in P2, and no edema   ABD:  soft and nontender with nl inspiratory excursion in the supine position. No bruits or organomegaly appreciated, bowel sounds nl  MS:  ext warm without deformities, calf tenderness, cyanosis - mild/moderate clubbing No obvious joint restrictions   SKIN: warm and dry without lesions    NEURO:  alert, approp, nl sensorium  .       I personally reviewed images and agree with radiology impression as follows:  CXR:   Pa and lat 12/12/19 Bilateral coarsening of the interstitium, could reflect chronic bronchitic change, acute component not excluded.                Assessment

## 2020-03-08 NOTE — Assessment & Plan Note (Signed)
Onset fall 2020 dx chf/ aflutter AKI/ HCAP req trach > out by d/c on  room air  - Quit smoking 06/2019  - 12/12/2019   Walked RA x two laps =  approx 245ft @ moderate pace - stopped due to sob with sats of 100 % at the end of the study. PFT's 01/06/20 FEV1 2.67 (69 % ) ratio 0.80  p 1 % improvement from saba p 0 prior to study with DLCO  17.87 (61%) corrects to 3.29 (76%)  for alv volume and FV curve minimally concave    He does have very mild increased markings on cxr and clubbing on exam  but given likely ALI/fibroproliferative phase from previous HCAP and present findings of no desats, almost nl dlco with correction for lung volume and improvement ex tol  on entresto I don't think he has a rapidly progressive fibrosing illness like IPF/UIP and can just be followed with self monitor sats with exertion and referred back here prn.   His biggest factor for on gong activity intol is awkward gait with leg weakness that developed at time of his prolonged acute illness suggestive of CIP (critical illness polyneuropathy) for which neuro eval is needed at this point > defer to PCP  Pulmonary f/u is prn desats or unexplained worsening of doe.          Each maintenance medication was reviewed in detail including emphasizing most importantly the difference between maintenance and prns and under what circumstances the prns are to be triggered using an action plan format where appropriate.  Total time for H and P, chart review, counseling, teaching device and generating customized AVS unique to this summary final  office visit / charting = 30 min

## 2020-03-08 NOTE — Patient Instructions (Signed)
You do not appear to have any significant lung problems but the best way to be sure you don't is to monitor your oxygen saturations (pulse oximetry) at highest level once a month or so - call me if losing ground with your wind or your 02 level  You need to be referred to a neurologist for your leg weakness by primary provider  Pulmonary follow up is as needed  Cough/ worse breathing/drop in your 0xygen levels

## 2020-03-29 DIAGNOSIS — Z9289 Personal history of other medical treatment: Secondary | ICD-10-CM | POA: Insufficient documentation

## 2020-04-13 DIAGNOSIS — I428 Other cardiomyopathies: Secondary | ICD-10-CM | POA: Insufficient documentation

## 2020-04-21 ENCOUNTER — Emergency Department (HOSPITAL_COMMUNITY)
Admission: EM | Admit: 2020-04-21 | Discharge: 2020-04-21 | Disposition: A | Discharge status: Home / Self Care | Payer: Medicaid Other | Attending: Emergency Medicine | Admitting: Emergency Medicine

## 2020-04-21 ENCOUNTER — Encounter (HOSPITAL_COMMUNITY): Payer: Self-pay | Admitting: Emergency Medicine

## 2020-04-21 ENCOUNTER — Other Ambulatory Visit: Payer: Self-pay

## 2020-04-21 DIAGNOSIS — Z87891 Personal history of nicotine dependence: Secondary | ICD-10-CM | POA: Insufficient documentation

## 2020-04-21 DIAGNOSIS — Z7901 Long term (current) use of anticoagulants: Secondary | ICD-10-CM | POA: Diagnosis not present

## 2020-04-21 DIAGNOSIS — I110 Hypertensive heart disease with heart failure: Secondary | ICD-10-CM | POA: Insufficient documentation

## 2020-04-21 DIAGNOSIS — H539 Unspecified visual disturbance: Secondary | ICD-10-CM | POA: Insufficient documentation

## 2020-04-21 DIAGNOSIS — I2510 Atherosclerotic heart disease of native coronary artery without angina pectoris: Secondary | ICD-10-CM | POA: Insufficient documentation

## 2020-04-21 DIAGNOSIS — R739 Hyperglycemia, unspecified: Secondary | ICD-10-CM

## 2020-04-21 DIAGNOSIS — Z79899 Other long term (current) drug therapy: Secondary | ICD-10-CM | POA: Diagnosis not present

## 2020-04-21 DIAGNOSIS — Z7984 Long term (current) use of oral hypoglycemic drugs: Secondary | ICD-10-CM | POA: Insufficient documentation

## 2020-04-21 DIAGNOSIS — R5383 Other fatigue: Secondary | ICD-10-CM | POA: Diagnosis not present

## 2020-04-21 DIAGNOSIS — I509 Heart failure, unspecified: Secondary | ICD-10-CM | POA: Diagnosis not present

## 2020-04-21 DIAGNOSIS — R358 Other polyuria: Secondary | ICD-10-CM | POA: Diagnosis not present

## 2020-04-21 DIAGNOSIS — E1165 Type 2 diabetes mellitus with hyperglycemia: Secondary | ICD-10-CM | POA: Insufficient documentation

## 2020-04-21 HISTORY — DX: Unspecified atrial flutter: I48.92

## 2020-04-21 HISTORY — DX: Acute embolism and thrombosis of unspecified deep veins of unspecified lower extremity: I82.409

## 2020-04-21 HISTORY — DX: Cardiac murmur, unspecified: R01.1

## 2020-04-21 HISTORY — DX: Acute respiratory failure, unspecified whether with hypoxia or hypercapnia: J96.00

## 2020-04-21 HISTORY — DX: Disorder of kidney and ureter, unspecified: N28.9

## 2020-04-21 HISTORY — DX: Essential (primary) hypertension: I10

## 2020-04-21 HISTORY — DX: Atherosclerotic heart disease of native coronary artery without angina pectoris: I25.10

## 2020-04-21 HISTORY — DX: Depression, unspecified: F32.A

## 2020-04-21 HISTORY — DX: Heart failure, unspecified: I50.9

## 2020-04-21 HISTORY — DX: Acute and subacute hepatic failure without coma: K72.00

## 2020-04-21 HISTORY — DX: Acute kidney failure, unspecified: N17.9

## 2020-04-21 HISTORY — DX: Type 2 diabetes mellitus without complications: E11.9

## 2020-04-21 LAB — CBC
HCT: 44.5 % (ref 39.0–52.0)
Hemoglobin: 14.7 g/dL (ref 13.0–17.0)
MCH: 27.4 pg (ref 26.0–34.0)
MCHC: 33 g/dL (ref 30.0–36.0)
MCV: 82.9 fL (ref 80.0–100.0)
Platelets: 312 10*3/uL (ref 150–400)
RBC: 5.37 MIL/uL (ref 4.22–5.81)
RDW: 17 % — ABNORMAL HIGH (ref 11.5–15.5)
WBC: 7.1 10*3/uL (ref 4.0–10.5)
nRBC: 0 % (ref 0.0–0.2)

## 2020-04-21 LAB — BASIC METABOLIC PANEL
Anion gap: 14 (ref 5–15)
BUN: 48 mg/dL — ABNORMAL HIGH (ref 6–20)
CO2: 23 mmol/L (ref 22–32)
Calcium: 9 mg/dL (ref 8.9–10.3)
Chloride: 83 mmol/L — ABNORMAL LOW (ref 98–111)
Creatinine, Ser: 1.42 mg/dL — ABNORMAL HIGH (ref 0.61–1.24)
GFR calc Af Amer: >60 mL/min (ref >60)
GFR calc non Af Amer: 54 mL/min — ABNORMAL LOW (ref >60)
Glucose, Bld: 694 mg/dL (ref 70–99)
Potassium: 4.5 mmol/L (ref 3.5–5.1)
Sodium: 120 mmol/L — ABNORMAL LOW (ref 135–145)

## 2020-04-21 LAB — BLOOD GAS, VENOUS
Acid-Base Excess: 1.4 mmol/L (ref 0.0–2.0)
Bicarbonate: 25.2 mmol/L (ref 20.0–28.0)
FIO2: 21
O2 Saturation: 79.2 %
Patient temperature: 36.6
pCO2, Ven: 39.7 mmHg — ABNORMAL LOW (ref 44.0–60.0)
pH, Ven: 7.422 (ref 7.250–7.430)
pO2, Ven: 46.3 mmHg — ABNORMAL HIGH (ref 32.0–45.0)

## 2020-04-21 LAB — CBG MONITORING, ED
Glucose-Capillary: >600 mg/dL (ref 70–99)
Glucose-Capillary: 473 mg/dL — ABNORMAL HIGH (ref 70–99)
Glucose-Capillary: 434 mg/dL — ABNORMAL HIGH (ref 70–99)
Glucose-Capillary: 371 mg/dL — ABNORMAL HIGH (ref 70–99)
Glucose-Capillary: 221 mg/dL — ABNORMAL HIGH (ref 70–99)

## 2020-04-21 LAB — URINALYSIS, ROUTINE W REFLEX MICROSCOPIC
Bacteria, UA: NONE SEEN
Bilirubin Urine: NEGATIVE
Glucose, UA: >=500 mg/dL — AB
Ketones, ur: NEGATIVE mg/dL
Leukocytes,Ua: NEGATIVE
Nitrite: NEGATIVE
Protein, ur: NEGATIVE mg/dL
Specific Gravity, Urine: 1.021 (ref 1.005–1.030)
pH: 6 (ref 5.0–8.0)

## 2020-04-21 LAB — BETA-HYDROXYBUTYRIC ACID: Beta-Hydroxybutyric Acid: 0.19 mmol/L (ref 0.05–0.27)

## 2020-04-21 MED ORDER — LACTATED RINGERS IV SOLN: INTRAVENOUS | Status: DC

## 2020-04-21 MED ORDER — INSULIN REGULAR(HUMAN) IN NACL 100-0.9 UT/100ML-% IV SOLN
INTRAVENOUS | Status: DC
  Administered 2020-04-21: 6 [IU]/h via INTRAVENOUS

## 2020-04-21 MED ORDER — DEXTROSE IN LACTATED RINGERS 5 % IV SOLN: INTRAVENOUS | Status: DC

## 2020-04-21 MED ORDER — DEXTROSE 50 % IV SOLN: 0.0000 mL | INTRAVENOUS | Status: DC | PRN

## 2020-04-21 NOTE — ED Notes (Signed)
[facility]  OD 20/30  OS 20/40  OU 20/30

## 2020-04-21 NOTE — ED Notes (Addendum)
Date and time results received: 04/21/20 1808   Test: Glucose Critical Value: 694  Name of Provider Notified: Alfredia Client  Orders Received? Or Actions Taken?: No new orders given.

## 2020-04-21 NOTE — Discharge Instructions (Addendum)
You are seen today for high blood sugar, I need you to follow-up with your primary care tomorrow. Continue to take your blood sugar medicine as prescribed.  Continue to take your blood sugar measurements as you have been doing. If they are elevated again please come back to the ER. I need you to also follow-up with an ophthalmologist, call Dr. Posey Pronto tomorrow morning for an appointment. Continue to stay hydrated, it is very important that you drink plenty of fluids. If you have any new or worsening concerning symptoms please come back to the emergency department. Please use the attached instructions.

## 2020-04-21 NOTE — ED Triage Notes (Addendum)
Pt reports elevated blood sugar and intermittent blurred vision for last several days. Pt hypotensive, bradycardic with EMS. Pt alert and oriented. Denies pain. Pt currently wearing external cardiac monitor for continuous monitoring related to an internal defibrillator that is supposed to be placed 9/13. EMS administered 1 liter normal saline bolus en route.

## 2020-04-21 NOTE — ED Provider Notes (Signed)
[facility] EMERGENCY DEPARTMENT Provider Note   CSN: [id number] Arrival date & time: 04/21/20  1646     History Chief Complaint  Patient presents with  . Hyperglycemia    Darryl Frye is a 59 y.o. male with past medical history of CHF, diabetes, hypertension, CAD that presents to the emergency department today for hyperglycemia via EMS.  Patient states for the past 2 days he has been feeling "off".  Will not describe more about this, however states that he has been having to urinate more and having some blurry vision for the past two days.  States that he has been taking his Metformin and glipizide and other medications as prescribed.  Also admits to some fatigue over the past 2 days.  States that he decided to take his blood sugar this morning, and it read " high" twice therefore he decided called EMS and was brought in here.  States that he is not on insulin.  Only takes Metformin and glipizide for diabetes.  Has never had anything happen to him like this before.  Denies any fevers, chills, sick contacts, URI-like symptoms.  States that this weekend he had a birthday cookout where he did eat a lot, more than normal.  Denies any eye pain, floaters, photophobia, diplopia.  States that blurry vision is only far distance.  Denies any chest pain, shortness of breath.  Denies any other symptoms.  EMS was able to give 2 L of fluids, according to triage note he was hypotensive and bradycardic with EMS, when patient arrived vitals were 115/84 and patient pulse 74.    HPI     Past Medical History:  Diagnosis Date  . Acute kidney injury ([place])   . Acute respiratory failure ([place])   . Atrial flutter ([place])   . CHF (congestive heart failure) ([place])   . Coronary artery disease   . Depression   . Diabetes mellitus without complication ([place])   . DVT (deep venous thrombosis) ([place])   . Heart murmur   . Hypertension   . Renal disorder   . Shock liver   . Weak heart [facility])     Patient Active Problem  List   Diagnosis Date Noted  . DOE (dyspnea on exertion) 12/12/2019    Past Surgical History:  Procedure Laterality Date  . CYST REMOVAL NECK    . HERNIA REPAIR    . TONSILLECTOMY         History reviewed. No pertinent family history.  Social History   Tobacco Use  . Smoking status: Former Smoker    Packs/day: 1.00    Years: 20.00    Pack years: 20.00    Types: Cigarettes    Quit date: 07/07/2019    Years since quitting: 0.7  . Smokeless tobacco: Never Used  Substance Use Topics  . Alcohol use: Not Currently  . Drug use: Not Currently    Home Medications Prior to Admission medications   Medication Sig Start Date End Date Taking? Authorizing Provider  apixaban (ELIQUIS) 5 MG TABS tablet Take 5 mg by mouth 2 (two) times daily.    [provider]  furosemide (LASIX) 20 MG tablet Take 20 mg by mouth daily.    [provider]  glipiZIDE (GLUCOTROL) 5 MG tablet Take 5 mg by mouth daily before breakfast.    [provider]  magnesium oxide (MAG-OX) 400 MG tablet Take 400 mg by mouth 2 (two) times daily as needed.    [provider]  metFORMIN (  GLUCOPHAGE) 500 MG tablet Take 500 mg by mouth 2 (two) times daily with a meal.    [provider]  metolazone (ZAROXOLYN) 5 MG tablet Take 5 mg by mouth daily. Before Furosemide    [provider]  metoprolol tartrate (LOPRESSOR) 25 MG tablet Take 25 mg by mouth daily.    [provider]  sacubitril-valsartan (ENTRESTO) 24-26 MG Take 1 tablet by mouth 2 (two) times daily.    [provider]  sertraline (ZOLOFT) 50 MG tablet Take 50 mg by mouth daily.    [provider]  spironolactone (ALDACTONE) 25 MG tablet Take 25 mg by mouth daily.    [provider]    Allergies    Patient has no known allergies.  Review of Systems   Review of Systems  Constitutional: Positive for fatigue. Negative for chills, diaphoresis and fever.  HENT: Negative for  congestion, sore throat and trouble swallowing.   Eyes: Positive for visual disturbance. Negative for pain.  Respiratory: Negative for cough, shortness of breath and wheezing.   Cardiovascular: Negative for chest pain, palpitations and leg swelling.  Gastrointestinal: Negative for abdominal distention, abdominal pain, diarrhea, nausea and vomiting.  Endocrine: Positive for polyuria. Negative for polydipsia and polyphagia.  Genitourinary: Positive for frequency. Negative for difficulty urinating, discharge, dysuria, hematuria, penile pain and penile swelling.  Musculoskeletal: Negative for back pain, neck pain and neck stiffness.  Skin: Negative for pallor.  Neurological: Negative for dizziness, seizures, speech difficulty, weakness, light-headedness, numbness and headaches.  Psychiatric/Behavioral: Negative for confusion.    Physical Exam Updated Vital Signs BP 115/84   Pulse 74   Temp 97.8 F (36.6 C) (Oral)   Resp 13   Ht 6\' 1"  (1.854 m)   Wt 83 kg   SpO2 97%   BMI 24.14 kg/m   Physical Exam Constitutional:      General: He is not in acute distress.    Appearance: Normal appearance. He is not ill-appearing, toxic-appearing or diaphoretic.  HENT:     Head: Normocephalic and atraumatic.     Mouth/Throat:     Mouth: Mucous membranes are moist.     Pharynx: Oropharynx is clear.  Eyes:     General: Lids are normal. Lids are everted, no foreign bodies appreciated. Vision grossly intact. No visual field deficit or scleral icterus.    Extraocular Movements: Extraocular movements intact.     Right eye: Normal extraocular motion and no nystagmus.     Left eye: Normal extraocular motion and no nystagmus.     Conjunctiva/sclera:     Right eye: Right conjunctiva is not injected.     Left eye: Left conjunctiva is not injected.     Pupils: Pupils are equal, round, and reactive to light.     Comments: Normal central and peripheral vision testing.   Cardiovascular:     Rate and Rhythm:  Normal rate and regular rhythm.     Pulses: Normal pulses.     Heart sounds: Normal heart sounds.  Pulmonary:     Effort: Pulmonary effort is normal. No respiratory distress.     Breath sounds: Normal breath sounds. No stridor. No wheezing, rhonchi or rales.  Chest:     Chest wall: No tenderness.  Abdominal:     General: Abdomen is flat. There is no distension.     Palpations: Abdomen is soft.     Tenderness: There is no abdominal tenderness. There is no guarding or rebound.  Musculoskeletal:  General: No swelling or tenderness. Normal range of motion.     Cervical back: Normal range of motion and neck supple. No rigidity.     Right lower leg: No edema.     Left lower leg: No edema.  Skin:    General: Skin is warm and dry.     Capillary Refill: Capillary refill takes less than 2 seconds.     Coloration: Skin is not pale.  Neurological:     General: No focal deficit present.     Mental Status: He is alert and oriented to person, place, and time.     Cranial Nerves: No cranial nerve deficit.     Sensory: No sensory deficit.     Motor: No weakness.     Coordination: Coordination normal.     Gait: Gait normal.     Comments: Normal sensation to feet  Psychiatric:        Mood and Affect: Mood normal.        Behavior: Behavior normal.     ED Results / Procedures / Treatments   Labs (all labs ordered are listed, but only abnormal results are displayed) Labs Reviewed  BASIC METABOLIC PANEL - Abnormal; Notable for the following components:      Result Value   Sodium 120 (*)    Chloride 83 (*)    Glucose, Bld 694 (*)    BUN 48 (*)    Creatinine, Ser 1.42 (*)    GFR calc non Af Amer 54 (*)    All other components within normal limits  CBC - Abnormal; Notable for the following components:   RDW 17.0 (*)    All other components within normal limits  URINALYSIS, ROUTINE W REFLEX MICROSCOPIC - Abnormal; Notable for the following components:   Color, Urine STRAW (*)     Glucose, UA >=500 (*)    Hgb urine dipstick SMALL (*)    All other components within normal limits  BLOOD GAS, VENOUS - Abnormal; Notable for the following components:   pCO2, Ven 39.7 (*)    pO2, Ven 46.3 (*)    All other components within normal limits  CBG MONITORING, ED - Abnormal; Notable for the following components:   Glucose-Capillary >600 (*)    All other components within normal limits  CBG MONITORING, ED - Abnormal; Notable for the following components:   Glucose-Capillary 473 (*)    All other components within normal limits  CBG MONITORING, ED - Abnormal; Notable for the following components:   Glucose-Capillary 434 (*)    All other components within normal limits  BETA-HYDROXYBUTYRIC ACID  CBG MONITORING, ED  CBG MONITORING, ED    EKG None  Radiology No results found.  Procedures Procedures (including critical care time)  Medications Ordered in ED Medications  insulin regular, human (MYXREDLIN) 100 units/ 100 mL infusion (6 Units/hr Intravenous New Bag/Given 04/21/20 1914)  lactated ringers infusion ( Intravenous New Bag/Given 04/21/20 1913)  dextrose 5 % in lactated ringers infusion (has no administration in time range)  dextrose 50 % solution 0-50 mL (has no administration in time range)    ED Course  I have reviewed the triage vital signs and the nursing notes.  Pertinent labs & imaging results that were available during my care of the patient were reviewed by me and considered in my medical decision making (see chart for details).    MDM Rules/Calculators/A&P  Dilyn Smiles is a 59 y.o. male with past medical history of CHF, diabetes, hypertension, CAD that presents to the emergency department today for hyperglycemia via EMS.  When patient arrived here vital signs stable.  Has been given 2 L of fluid at this time.  Labs do not show DKA or HHS.  Glucose 694.  Calculated serum osmolality, is 296 per MD call.  This is a normal range.  Visual acuity normal. Normal neuro exam.   CBC with normal white count and hemoglobin.  CMP with sodium of 120, after correction for hyperglycemia it is 134 according to MD calc.  Normal pH.  Normal hydroxybutyric acid.  No ketones in urine.  No infectious-like symptoms, however I think inciting event was birthday cookout this weekend.  Patient has been compliant with medications.  Creatinine elevated to 1.42, BUN 48, this does appear prerenal.  No other values to compare.  However we are giving him fluids for this at this time.  Pt care was handed off to T. Triplett PA-C at shift change.  Complete history and physical and current plan have been communicated.  Please refer to their note for the remainder of ED care and ultimate disposition. Awaiting for glucose to be rechecked and then to be discharged.   I discussed this case with my attending physician, Dr. Roderic Palau, who cosigned this note including patient's presenting symptoms, physical exam, and planned diagnostics and interventions. Attending physician stated agreement with plan or made changes to plan which were implemented.    Final Clinical Impression(s) / ED Diagnoses Final diagnoses:  Hyperglycemia    Rx / DC Orders ED Discharge Orders    None       Alfredia Client, PA-C 04/21/20 2038    Milton Ferguson, MD 04/22/20 [phone]

## 2020-04-22 NOTE — ED Provider Notes (Signed)
   Pt signed out to me by Alfredia Client, PA-C pending treatment course and reassessment.    Pt with known hx of diabetes, here for evaluation of hyperglycemia.  Initial blood sugar 694, and workup did not show evidence for DKA or HHS.  He was given IV LR and insulin.  Currently, he is feeling better and blood sugar is trending down nicely.  2150 On recheck, blood sugar is now 221 and pt reports feeling much better and he is requesting discharge home.  I feel he is appropriate for d/c home at this time.  He agrees to close out patient f/u and return precautions given.    Kem Parkinson, PA-C 04/23/20 1450    Milton Ferguson, MD 04/27/20 [phone]

## 2020-05-17 DIAGNOSIS — Z87891 Personal history of nicotine dependence: Secondary | ICD-10-CM | POA: Insufficient documentation

## 2020-05-17 DIAGNOSIS — Z87898 Personal history of other specified conditions: Secondary | ICD-10-CM | POA: Insufficient documentation

## 2020-05-17 DIAGNOSIS — E1165 Type 2 diabetes mellitus with hyperglycemia: Secondary | ICD-10-CM | POA: Insufficient documentation

## 2020-05-17 DIAGNOSIS — IMO0002 Reserved for concepts with insufficient information to code with codable children: Secondary | ICD-10-CM | POA: Insufficient documentation

## 2020-11-29 ENCOUNTER — Other Ambulatory Visit: Payer: Self-pay | Admitting: Internal Medicine

## 2020-11-29 DIAGNOSIS — R911 Solitary pulmonary nodule: Secondary | ICD-10-CM

## 2020-12-27 ENCOUNTER — Ambulatory Visit (HOSPITAL_COMMUNITY): Payer: Medicaid Other

## 2021-01-24 ENCOUNTER — Ambulatory Visit (HOSPITAL_COMMUNITY)
Admission: RE | Admit: 2021-01-24 | Discharge: 2021-01-24 | Disposition: A | Discharge status: Home / Self Care | Payer: Medicaid Other | Source: Ambulatory Visit | Attending: Internal Medicine | Admitting: Internal Medicine

## 2021-01-24 DIAGNOSIS — R911 Solitary pulmonary nodule: Secondary | ICD-10-CM | POA: Diagnosis not present

## 2021-01-24 LAB — POCT I-STAT CREATININE: Creatinine, Ser: 1.1 mg/dL (ref 0.61–1.24)

## 2021-01-24 MED ORDER — IOHEXOL 300 MG/ML  SOLN
75.0000 mL | Freq: Once | INTRAMUSCULAR | Status: AC | PRN
  Administered 2021-01-24: 75 mL via INTRAVENOUS

## 2021-02-17 ENCOUNTER — Encounter: Payer: Self-pay | Admitting: Internal Medicine

## 2021-04-08 DIAGNOSIS — I4892 Unspecified atrial flutter: Secondary | ICD-10-CM | POA: Insufficient documentation

## 2021-04-09 DIAGNOSIS — U071 COVID-19: Secondary | ICD-10-CM | POA: Insufficient documentation

## 2021-04-09 DIAGNOSIS — Z4502 Encounter for adjustment and management of automatic implantable cardiac defibrillator: Secondary | ICD-10-CM | POA: Insufficient documentation

## 2021-04-09 DIAGNOSIS — I10 Essential (primary) hypertension: Secondary | ICD-10-CM | POA: Insufficient documentation

## 2021-05-26 ENCOUNTER — Encounter: Payer: Self-pay | Admitting: Internal Medicine

## 2021-06-24 ENCOUNTER — Ambulatory Visit: Payer: Medicaid Other | Admitting: Gastroenterology

## 2021-07-30 ENCOUNTER — Emergency Department (HOSPITAL_COMMUNITY)
Admission: EM | Admit: 2021-07-30 | Discharge: 2021-07-30 | Disposition: A | Discharge status: Home / Self Care | Payer: Medicaid Other | Attending: Emergency Medicine | Admitting: Emergency Medicine

## 2021-07-30 ENCOUNTER — Encounter (HOSPITAL_COMMUNITY): Payer: Self-pay | Admitting: *Deleted

## 2021-07-30 ENCOUNTER — Other Ambulatory Visit: Payer: Self-pay

## 2021-07-30 DIAGNOSIS — I10 Essential (primary) hypertension: Secondary | ICD-10-CM | POA: Diagnosis not present

## 2021-07-30 DIAGNOSIS — Z7901 Long term (current) use of anticoagulants: Secondary | ICD-10-CM | POA: Diagnosis not present

## 2021-07-30 DIAGNOSIS — R42 Dizziness and giddiness: Secondary | ICD-10-CM | POA: Insufficient documentation

## 2021-07-30 DIAGNOSIS — M545 Low back pain, unspecified: Secondary | ICD-10-CM | POA: Diagnosis not present

## 2021-07-30 DIAGNOSIS — I110 Hypertensive heart disease with heart failure: Secondary | ICD-10-CM | POA: Diagnosis not present

## 2021-07-30 DIAGNOSIS — Z794 Long term (current) use of insulin: Secondary | ICD-10-CM | POA: Diagnosis not present

## 2021-07-30 DIAGNOSIS — I509 Heart failure, unspecified: Secondary | ICD-10-CM | POA: Diagnosis not present

## 2021-07-30 DIAGNOSIS — Z95810 Presence of automatic (implantable) cardiac defibrillator: Secondary | ICD-10-CM | POA: Insufficient documentation

## 2021-07-30 DIAGNOSIS — Z79899 Other long term (current) drug therapy: Secondary | ICD-10-CM | POA: Diagnosis not present

## 2021-07-30 DIAGNOSIS — Z7984 Long term (current) use of oral hypoglycemic drugs: Secondary | ICD-10-CM | POA: Diagnosis not present

## 2021-07-30 DIAGNOSIS — Z87891 Personal history of nicotine dependence: Secondary | ICD-10-CM | POA: Diagnosis not present

## 2021-07-30 DIAGNOSIS — R0602 Shortness of breath: Secondary | ICD-10-CM | POA: Insufficient documentation

## 2021-07-30 DIAGNOSIS — Z6829 Body mass index (BMI) 29.0-29.9, adult: Secondary | ICD-10-CM | POA: Insufficient documentation

## 2021-07-30 DIAGNOSIS — F418 Other specified anxiety disorders: Secondary | ICD-10-CM | POA: Insufficient documentation

## 2021-07-30 MED ORDER — METHOCARBAMOL 500 MG PO TABS
500.0000 mg | ORAL_TABLET | Freq: Once | ORAL | Status: AC
  Administered 2021-07-30: 500 mg via ORAL
  Filled 2021-07-30: qty 1

## 2021-07-30 MED ORDER — KETOROLAC TROMETHAMINE 30 MG/ML IJ SOLN: 30.0000 mg | Freq: Once | INTRAMUSCULAR | Status: DC

## 2021-07-30 MED ORDER — FENTANYL CITRATE PF 50 MCG/ML IJ SOSY
50.0000 ug | PREFILLED_SYRINGE | Freq: Once | INTRAMUSCULAR | Status: AC
  Administered 2021-07-30: 50 ug via INTRAMUSCULAR
  Filled 2021-07-30: qty 1

## 2021-07-30 MED ORDER — METHOCARBAMOL 500 MG PO TABS: 500.0000 mg | ORAL_TABLET | Freq: Two times a day (BID) | ORAL | 0 refills | Status: DC

## 2021-07-30 MED ORDER — ACETAMINOPHEN 325 MG PO TABS
650.0000 mg | ORAL_TABLET | Freq: Once | ORAL | Status: AC
  Administered 2021-07-30: 650 mg via ORAL
  Filled 2021-07-30: qty 2

## 2021-07-30 NOTE — ED Notes (Signed)
Sign pad not working. Pt verbalized understanding of discharge paperwork.

## 2021-07-30 NOTE — ED Provider Notes (Signed)
[facility] EMERGENCY DEPARTMENT Provider Note  CSN: [id number] Arrival date & time: 07/30/21 0250    History Chief Complaint  Patient presents with   Back Pain    Darryl Frye is a 60 y.o. male with history of kidney disease, CAD, CHF, aflutter on eliquis reports 2 days of moderate aching L lower back pain, worse with movement. No falls or injuries. No pain radiating into L leg. No blood in urine or pain radiating to LLQ. He denies any fever, nausea or vomiting. Pain is minimal at rest, only hurts with movement.    Past Medical History:  Diagnosis Date   Acute kidney injury ([place])    Acute respiratory failure ([facility])    Atrial flutter ([facility])    CHF (congestive heart failure) ([facility])    Coronary artery disease    Depression    Diabetes mellitus without complication ([facility])    DVT (deep venous thrombosis) ([facility])    Heart murmur    Hypertension    Renal disorder    Shock liver    Weak heart ([facility])     Past Surgical History:  Procedure Laterality Date   CYST REMOVAL NECK     HERNIA REPAIR     TONSILLECTOMY      History reviewed. No pertinent family history.  Social History   Tobacco Use   Smoking status: Former    Packs/day: 1.00    Years: 20.00    Pack years: 20.00    Types: Cigarettes    Quit date: 07/07/2019    Years since quitting: 2.0   Smokeless tobacco: Never  Substance Use Topics   Alcohol use: Not Currently   Drug use: Not Currently     Home Medications Prior to Admission medications   Medication Sig Start Date End Date Taking? Authorizing Provider  apixaban (ELIQUIS) 5 MG TABS tablet Take by mouth. 05/23/20  Yes [provider]  atorvastatin (LIPITOR) 10 MG tablet Take by mouth. 03/23/21  Yes [provider]  empagliflozin (JARDIANCE) 25 MG TABS tablet Take 1 tablet by mouth daily. 04/10/21 04/10/22 Yes [provider]  furosemide (LASIX) 20 MG tablet Take by mouth. 10/25/19  Yes [provider]  insulin glargine (LANTUS)  100 UNIT/ML Solostar Pen Inject into the skin. 04/10/21  Yes [provider]  magnesium oxide (MAG-OX) 400 MG tablet Take by mouth. 10/24/19  Yes [provider]  metFORMIN (GLUCOPHAGE) 1000 MG tablet Take by mouth. 04/10/21 04/10/22 Yes [provider]  metFORMIN (GLUCOPHAGE) 500 MG tablet Take 1 tablet by mouth 2 (two) times daily with a meal. 10/24/19  Yes [provider]  methocarbamol (ROBAXIN) 500 MG tablet Take 1 tablet (500 mg total) by mouth 2 (two) times daily. 07/30/21  Yes Truddie Hidden, MD  metolazone (ZAROXOLYN) 5 MG tablet Take by mouth. 04/10/21  Yes [provider]  metoprolol succinate (TOPROL-XL) 25 MG 24 hr tablet Take by mouth. 04/10/21 04/10/22 Yes [provider]  sertraline (ZOLOFT) 50 MG tablet Take 1 tablet by mouth daily. 10/25/19  Yes [provider]  spironolactone (ALDACTONE) 25 MG tablet Take 1 tablet by mouth daily. 04/10/21  Yes [provider]  apixaban (ELIQUIS) 5 MG TABS tablet Take 5 mg by mouth 2 (two) times daily.    [provider]  furosemide (LASIX) 20 MG tablet Take 20 mg by mouth daily.    [provider]  glipiZIDE (GLUCOTROL) 5 MG tablet Take 5 mg by mouth daily before breakfast.  [provider]  magnesium oxide (MAG-OX) 400 MG tablet Take 400 mg by mouth 2 (two) times daily as needed.    [provider]  metFORMIN (GLUCOPHAGE) 500 MG tablet Take 500 mg by mouth 2 (two) times daily with a meal.    [provider]  metolazone (ZAROXOLYN) 5 MG tablet Take 5 mg by mouth daily. Before Furosemide    [provider]  metoprolol tartrate (LOPRESSOR) 25 MG tablet Take 25 mg by mouth daily.    [provider]  sacubitril-valsartan (ENTRESTO) 24-26 MG Take 1 tablet by mouth 2 (two) times daily.    [provider]  sertraline (ZOLOFT) 50 MG tablet Take 50 mg by mouth daily.    [provider]  spironolactone  (ALDACTONE) 25 MG tablet Take 25 mg by mouth daily.    [provider]     Allergies    Patient has no known allergies.   Review of Systems   Review of Systems A comprehensive review of systems was completed and negative except as noted in HPI.    Physical Exam BP 113/82   Pulse 71   Temp 98.3 F (36.8 C) (Oral)   Resp 20   Ht 5\' 11"  (1.803 m)   Wt 90.7 kg   SpO2 97%   BMI 27.89 kg/m   Physical Exam Vitals and nursing note reviewed.  Constitutional:      Appearance: Normal appearance.  HENT:     Head: Normocephalic and atraumatic.     Nose: Nose normal.     Mouth/Throat:     Mouth: Mucous membranes are moist.  Eyes:     Extraocular Movements: Extraocular movements intact.     Conjunctiva/sclera: Conjunctivae normal.  Cardiovascular:     Rate and Rhythm: Normal rate.  Pulmonary:     Effort: Pulmonary effort is normal.     Breath sounds: Normal breath sounds.  Abdominal:     General: Abdomen is flat.     Palpations: Abdomen is soft.     Tenderness: There is no abdominal tenderness.  Musculoskeletal:        General: Tenderness (mild tenderness in L lumbar paraspinal muscles, no tenderness at sciatic notch) present. No swelling. Normal range of motion.     Cervical back: Neck supple.  Skin:    General: Skin is warm and dry.  Neurological:     General: No focal deficit present.     Mental Status: He is alert.     Cranial Nerves: No cranial nerve deficit.     Sensory: No sensory deficit.     Motor: No weakness.  Psychiatric:        Mood and Affect: Mood normal.     ED Results / Procedures / Treatments   Labs (all labs ordered are listed, but only abnormal results are displayed) Labs Reviewed - No data to display  EKG None  Radiology No results found.  Procedures Procedures  Medications Ordered in the ED Medications  acetaminophen (TYLENOL) tablet 650 mg (has no administration in time range)  methocarbamol (ROBAXIN) tablet 500 mg (has  no administration in time range)  fentaNYL (SUBLIMAZE) injection 50 mcg (has no administration in time range)     MDM Rules/Calculators/A&P MDM  Patient with MSK back pain. No Red Flags. His other medical problems and medications will preclude NSAIDs, plan APAP, robaxin as an outpatient. Fentanyl IM for pain here. PCP follow up next week.  ED Course  I have reviewed the triage vital  signs and the nursing notes.  Pertinent labs & imaging results that were available during my care of the patient were reviewed by me and considered in my medical decision making (see chart for details).     Final Clinical Impression(s) / ED Diagnoses Final diagnoses:  Acute left-sided low back pain without sciatica    Rx / DC Orders ED Discharge Orders          Ordered    methocarbamol (ROBAXIN) 500 MG tablet  2 times daily        07/30/21 0830             Truddie Hidden, MD 07/30/21 0830

## 2021-07-30 NOTE — ED Triage Notes (Addendum)
Pt c/o left lower back pain that started yesterday, states that the pain radiates to left hip area and pain is worse with movement,  denies any injury,

## 2021-10-14 ENCOUNTER — Inpatient Hospital Stay (HOSPITAL_COMMUNITY)
Admission: EM | Admit: 2021-10-14 | Discharge: 2021-10-18 | DRG: 309 | Disposition: A | Discharge status: Home / Self Care | Payer: Medicaid Other | Attending: Internal Medicine | Admitting: Internal Medicine

## 2021-10-14 ENCOUNTER — Other Ambulatory Visit: Payer: Self-pay

## 2021-10-14 ENCOUNTER — Emergency Department (HOSPITAL_COMMUNITY): Payer: Medicaid Other

## 2021-10-14 ENCOUNTER — Encounter (HOSPITAL_COMMUNITY): Payer: Self-pay

## 2021-10-14 DIAGNOSIS — I428 Other cardiomyopathies: Secondary | ICD-10-CM | POA: Diagnosis present

## 2021-10-14 DIAGNOSIS — F32A Depression, unspecified: Secondary | ICD-10-CM | POA: Diagnosis present

## 2021-10-14 DIAGNOSIS — Z4502 Encounter for adjustment and management of automatic implantable cardiac defibrillator: Secondary | ICD-10-CM

## 2021-10-14 DIAGNOSIS — Z95810 Presence of automatic (implantable) cardiac defibrillator: Secondary | ICD-10-CM

## 2021-10-14 DIAGNOSIS — I959 Hypotension, unspecified: Secondary | ICD-10-CM | POA: Diagnosis present

## 2021-10-14 DIAGNOSIS — Z87891 Personal history of nicotine dependence: Secondary | ICD-10-CM

## 2021-10-14 DIAGNOSIS — E1165 Type 2 diabetes mellitus with hyperglycemia: Secondary | ICD-10-CM | POA: Diagnosis present

## 2021-10-14 DIAGNOSIS — I4892 Unspecified atrial flutter: Secondary | ICD-10-CM | POA: Diagnosis present

## 2021-10-14 DIAGNOSIS — N179 Acute kidney failure, unspecified: Secondary | ICD-10-CM | POA: Diagnosis present

## 2021-10-14 DIAGNOSIS — I2510 Atherosclerotic heart disease of native coronary artery without angina pectoris: Secondary | ICD-10-CM | POA: Diagnosis present

## 2021-10-14 DIAGNOSIS — I5022 Chronic systolic (congestive) heart failure: Secondary | ICD-10-CM | POA: Diagnosis present

## 2021-10-14 DIAGNOSIS — Z794 Long term (current) use of insulin: Secondary | ICD-10-CM

## 2021-10-14 DIAGNOSIS — I4891 Unspecified atrial fibrillation: Principal | ICD-10-CM | POA: Diagnosis present

## 2021-10-14 DIAGNOSIS — Z79899 Other long term (current) drug therapy: Secondary | ICD-10-CM

## 2021-10-14 DIAGNOSIS — I110 Hypertensive heart disease with heart failure: Secondary | ICD-10-CM | POA: Diagnosis present

## 2021-10-14 DIAGNOSIS — R002 Palpitations: Secondary | ICD-10-CM | POA: Diagnosis present

## 2021-10-14 DIAGNOSIS — I10 Essential (primary) hypertension: Secondary | ICD-10-CM | POA: Diagnosis present

## 2021-10-14 DIAGNOSIS — I5023 Acute on chronic systolic (congestive) heart failure: Secondary | ICD-10-CM | POA: Diagnosis present

## 2021-10-14 DIAGNOSIS — Z7901 Long term (current) use of anticoagulants: Secondary | ICD-10-CM

## 2021-10-14 DIAGNOSIS — E119 Type 2 diabetes mellitus without complications: Secondary | ICD-10-CM

## 2021-10-14 DIAGNOSIS — Z20822 Contact with and (suspected) exposure to covid-19: Secondary | ICD-10-CM | POA: Diagnosis present

## 2021-10-14 DIAGNOSIS — Z7984 Long term (current) use of oral hypoglycemic drugs: Secondary | ICD-10-CM

## 2021-10-14 HISTORY — DX: Presence of cardiac pacemaker: Z95.0

## 2021-10-14 LAB — BASIC METABOLIC PANEL
Anion gap: 10 (ref 5–15)
BUN: 22 mg/dL — ABNORMAL HIGH (ref 6–20)
CO2: 26 mmol/L (ref 22–32)
Calcium: 9.4 mg/dL (ref 8.9–10.3)
Chloride: 98 mmol/L (ref 98–111)
Creatinine, Ser: 1.24 mg/dL (ref 0.61–1.24)
GFR, Estimated: >60 mL/min (ref >60)
Glucose, Bld: 335 mg/dL — ABNORMAL HIGH (ref 70–99)
Potassium: 4.4 mmol/L (ref 3.5–5.1)
Sodium: 134 mmol/L — ABNORMAL LOW (ref 135–145)

## 2021-10-14 LAB — RESP PANEL BY RT-PCR (FLU A&B, COVID) ARPGX2
Influenza A by PCR: NEGATIVE
Influenza B by PCR: NEGATIVE
SARS Coronavirus 2 by RT PCR: NEGATIVE

## 2021-10-14 LAB — CBC
HCT: 51.2 % (ref 39.0–52.0)
Hemoglobin: 16.2 g/dL (ref 13.0–17.0)
MCH: 27.2 pg (ref 26.0–34.0)
MCHC: 31.6 g/dL (ref 30.0–36.0)
MCV: 85.9 fL (ref 80.0–100.0)
Platelets: 353 10*3/uL (ref 150–400)
RBC: 5.96 MIL/uL — ABNORMAL HIGH (ref 4.22–5.81)
RDW: 16.3 % — ABNORMAL HIGH (ref 11.5–15.5)
WBC: 6.4 10*3/uL (ref 4.0–10.5)
nRBC: 0 % (ref 0.0–0.2)

## 2021-10-14 LAB — TROPONIN I (HIGH SENSITIVITY)
Troponin I (High Sensitivity): 31 ng/L — ABNORMAL HIGH (ref <18)
Troponin I (High Sensitivity): 37 ng/L — ABNORMAL HIGH (ref <18)

## 2021-10-14 LAB — GLUCOSE, CAPILLARY: Glucose-Capillary: 302 mg/dL — ABNORMAL HIGH (ref 70–99)

## 2021-10-14 LAB — BRAIN NATRIURETIC PEPTIDE: B Natriuretic Peptide: 100 pg/mL (ref 0.0–100.0)

## 2021-10-14 MED ORDER — ATORVASTATIN CALCIUM 10 MG PO TABS
10.0000 mg | ORAL_TABLET | Freq: Every day | ORAL | Status: DC
  Administered 2021-10-15 – 2021-10-18 (×4): 10 mg via ORAL
  Filled 2021-10-14 (×4): qty 1

## 2021-10-14 MED ORDER — SERTRALINE HCL 50 MG PO TABS
50.0000 mg | ORAL_TABLET | Freq: Every day | ORAL | Status: DC
  Administered 2021-10-15 – 2021-10-18 (×4): 50 mg via ORAL
  Filled 2021-10-14 (×4): qty 1

## 2021-10-14 MED ORDER — DILTIAZEM HCL-DEXTROSE 125-5 MG/125ML-% IV SOLN (PREMIX)
5.0000 mg/h | INTRAVENOUS | Status: DC
  Administered 2021-10-14 – 2021-10-15 (×2): 5 mg/h via INTRAVENOUS
  Filled 2021-10-14: qty 125

## 2021-10-14 MED ORDER — DILTIAZEM HCL 30 MG PO TABS
30.0000 mg | ORAL_TABLET | Freq: Once | ORAL | Status: AC
  Administered 2021-10-14: 30 mg via ORAL
  Filled 2021-10-14: qty 1

## 2021-10-14 MED ORDER — ACETAMINOPHEN 325 MG PO TABS: 650.0000 mg | ORAL_TABLET | ORAL | Status: DC | PRN

## 2021-10-14 MED ORDER — GLIPIZIDE 5 MG PO TABS
5.0000 mg | ORAL_TABLET | Freq: Every day | ORAL | Status: DC
  Administered 2021-10-15 – 2021-10-18 (×4): 5 mg via ORAL
  Filled 2021-10-14 (×4): qty 1

## 2021-10-14 MED ORDER — METOPROLOL SUCCINATE ER 25 MG PO TB24
25.0000 mg | ORAL_TABLET | Freq: Every day | ORAL | Status: DC
  Administered 2021-10-15 – 2021-10-16 (×2): 25 mg via ORAL
  Filled 2021-10-14 (×2): qty 1

## 2021-10-14 MED ORDER — METFORMIN HCL 500 MG PO TABS
500.0000 mg | ORAL_TABLET | Freq: Two times a day (BID) | ORAL | Status: DC
  Administered 2021-10-15 – 2021-10-18 (×7): 500 mg via ORAL
  Filled 2021-10-14 (×7): qty 1

## 2021-10-14 MED ORDER — SACUBITRIL-VALSARTAN 24-26 MG PO TABS
1.0000 | ORAL_TABLET | Freq: Two times a day (BID) | ORAL | Status: DC
  Administered 2021-10-14 – 2021-10-16 (×4): 1 via ORAL
  Filled 2021-10-14 (×4): qty 1

## 2021-10-14 MED ORDER — CHLORHEXIDINE GLUCONATE CLOTH 2 % EX PADS
6.0000 | MEDICATED_PAD | Freq: Every day | CUTANEOUS | Status: DC
  Administered 2021-10-14 – 2021-10-17 (×3): 6 via TOPICAL

## 2021-10-14 MED ORDER — METHOCARBAMOL 500 MG PO TABS
500.0000 mg | ORAL_TABLET | Freq: Two times a day (BID) | ORAL | Status: DC
  Administered 2021-10-14 – 2021-10-18 (×8): 500 mg via ORAL
  Filled 2021-10-14 (×9): qty 1

## 2021-10-14 MED ORDER — SPIRONOLACTONE 25 MG PO TABS
25.0000 mg | ORAL_TABLET | Freq: Every day | ORAL | Status: DC
  Administered 2021-10-15 – 2021-10-16 (×2): 25 mg via ORAL
  Filled 2021-10-14 (×2): qty 1

## 2021-10-14 MED ORDER — DILTIAZEM HCL-DEXTROSE 125-5 MG/125ML-% IV SOLN (PREMIX)
5.0000 mg/h | INTRAVENOUS | Status: DC
  Administered 2021-10-14: 5 mg/h via INTRAVENOUS
  Filled 2021-10-14: qty 125

## 2021-10-14 MED ORDER — INSULIN ASPART 100 UNIT/ML IJ SOLN
0.0000 [IU] | Freq: Three times a day (TID) | INTRAMUSCULAR | Status: DC
  Administered 2021-10-15 (×2): 3 [IU] via SUBCUTANEOUS
  Administered 2021-10-16 (×2): 8 [IU] via SUBCUTANEOUS
  Administered 2021-10-16: 2 [IU] via SUBCUTANEOUS
  Administered 2021-10-17: 8 [IU] via SUBCUTANEOUS
  Administered 2021-10-17: 2 [IU] via SUBCUTANEOUS
  Administered 2021-10-17: 5 [IU] via SUBCUTANEOUS
  Administered 2021-10-18: 2 [IU] via SUBCUTANEOUS

## 2021-10-14 MED ORDER — METOPROLOL TARTRATE 5 MG/5ML IV SOLN
5.0000 mg | Freq: Once | INTRAVENOUS | Status: AC
  Administered 2021-10-14: 5 mg via INTRAVENOUS
  Filled 2021-10-14: qty 5

## 2021-10-14 MED ORDER — INSULIN ASPART 100 UNIT/ML IJ SOLN
0.0000 [IU] | Freq: Every day | INTRAMUSCULAR | Status: DC
  Administered 2021-10-15: 4 [IU] via SUBCUTANEOUS
  Administered 2021-10-15 – 2021-10-16 (×2): 2 [IU] via SUBCUTANEOUS

## 2021-10-14 MED ORDER — FUROSEMIDE 20 MG PO TABS
20.0000 mg | ORAL_TABLET | Freq: Every day | ORAL | Status: DC
  Administered 2021-10-15: 20 mg via ORAL
  Filled 2021-10-14: qty 1

## 2021-10-14 MED ORDER — DILTIAZEM LOAD VIA INFUSION
10.0000 mg | Freq: Once | INTRAVENOUS | Status: AC
Start: 2021-10-14 — End: 2021-10-14
  Administered 2021-10-14: 10 mg via INTRAVENOUS
  Filled 2021-10-14: qty 10

## 2021-10-14 MED ORDER — EMPAGLIFLOZIN 25 MG PO TABS
25.0000 mg | ORAL_TABLET | Freq: Every day | ORAL | Status: DC
  Administered 2021-10-15 – 2021-10-18 (×4): 25 mg via ORAL
  Filled 2021-10-14 (×5): qty 1

## 2021-10-14 MED ORDER — APIXABAN 5 MG PO TABS
5.0000 mg | ORAL_TABLET | Freq: Two times a day (BID) | ORAL | Status: DC
Start: 2021-10-14 — End: 2021-10-18
  Administered 2021-10-15 – 2021-10-18 (×7): 5 mg via ORAL
  Filled 2021-10-14 (×7): qty 1

## 2021-10-14 MED ORDER — ONDANSETRON HCL 4 MG/2ML IJ SOLN: 4.0000 mg | Freq: Four times a day (QID) | INTRAMUSCULAR | Status: DC | PRN

## 2021-10-14 MED ORDER — METOLAZONE 5 MG PO TABS
5.0000 mg | ORAL_TABLET | Freq: Every day | ORAL | Status: DC
  Administered 2021-10-15 – 2021-10-16 (×2): 5 mg via ORAL
  Filled 2021-10-14 (×2): qty 1

## 2021-10-14 NOTE — ED Triage Notes (Signed)
Patient has been having issues with defib and it keeps beep.  Reports he cant lay flat to sleep due to SOB.  Denies pain in chest.  EMs reports junctional tachy rhythm

## 2021-10-14 NOTE — ED Provider Notes (Signed)
[facility] EMERGENCY DEPARTMENT Provider Note   CSN: [id number] Arrival date & time: 10/14/21  1641     History  Chief Complaint  Patient presents with   Palpitations    Darryl Frye is a 61 y.o. male with a history of nonischemic cardiomyopathy, congestive heart failure with an EF of 20 to 25%, [employer] pacemaker ([employer]), on Eliquis, presented to ED with shortness of breath and palpitations.  Patient feels that the symptoms worsened in the past 2 days.  He does recall receiving shock 2 or 3 weeks ago.  He says the device also tends to be up at night when he is lying down.  HPI     Home Medications Prior to Admission medications   Medication Sig Start Date End Date Taking? Authorizing Provider  apixaban (ELIQUIS) 5 MG TABS tablet Take 5 mg by mouth 2 (two) times daily.    [provider]  apixaban (ELIQUIS) 5 MG TABS tablet Take by mouth. 05/23/20   [provider]  atorvastatin (LIPITOR) 10 MG tablet Take by mouth. 03/23/21   [provider]  empagliflozin (JARDIANCE) 25 MG TABS tablet Take 1 tablet by mouth daily. 04/10/21 04/10/22  [provider]  furosemide (LASIX) 20 MG tablet Take 20 mg by mouth daily.    [provider]  furosemide (LASIX) 20 MG tablet Take by mouth. 10/25/19   [provider]  glipiZIDE (GLUCOTROL) 5 MG tablet Take 5 mg by mouth daily before breakfast.    [provider]  insulin glargine (LANTUS) 100 UNIT/ML Solostar Pen Inject into the skin. 04/10/21   [provider]  magnesium oxide (MAG-OX) 400 MG tablet Take 400 mg by mouth 2 (two) times daily as needed.    [provider]  magnesium oxide (MAG-OX) 400 MG tablet Take by mouth. 10/24/19   [provider]  metFORMIN (GLUCOPHAGE) 1000 MG tablet Take by mouth. 04/10/21 04/10/22  [provider]  metFORMIN (GLUCOPHAGE) 500 MG tablet Take 500 mg by mouth 2 (two) times daily with a meal.    [provider]   metFORMIN (GLUCOPHAGE) 500 MG tablet Take 1 tablet by mouth 2 (two) times daily with a meal. 10/24/19   [provider]  methocarbamol (ROBAXIN) 500 MG tablet Take 1 tablet (500 mg total) by mouth 2 (two) times daily. 07/30/21   Truddie Hidden, MD  metolazone (ZAROXOLYN) 5 MG tablet Take 5 mg by mouth daily. Before Furosemide    [provider]  metolazone (ZAROXOLYN) 5 MG tablet Take by mouth. 04/10/21   [provider]  metoprolol succinate (TOPROL-XL) 25 MG 24 hr tablet Take by mouth. 04/10/21 04/10/22  [provider]  metoprolol tartrate (LOPRESSOR) 25 MG tablet Take 25 mg by mouth daily.    [provider]  sacubitril-valsartan (ENTRESTO) 24-26 MG Take 1 tablet by mouth 2 (two) times daily.    [provider]  sertraline (ZOLOFT) 50 MG tablet Take 50 mg by mouth daily.    [provider]  sertraline (ZOLOFT) 50 MG tablet Take 1 tablet by mouth daily. 10/25/19   [provider]  spironolactone (ALDACTONE) 25 MG tablet Take 25 mg by mouth daily.    [provider]  spironolactone (ALDACTONE) 25 MG tablet Take 1 tablet by mouth daily. 04/10/21   [provider]      Allergies    Patient has no known allergies.    Review of Systems   Review of Systems  Physical Exam Updated  Vital Signs BP 105/82    Pulse (!) 59    Temp 98 F (36.7 C) (Oral)    Resp (!) 23    Ht 5\' 11"  (1.803 m)    Wt 91.9 kg    SpO2 92%    BMI 28.26 kg/m  Physical Exam Constitutional:      General: He is not in acute distress. HENT:     Head: Normocephalic and atraumatic.  Eyes:     Conjunctiva/sclera: Conjunctivae normal.     Pupils: Pupils are equal, round, and reactive to light.  Cardiovascular:     Rate and Rhythm: Tachycardia present. Rhythm irregular.  Pulmonary:     Effort: Pulmonary effort is normal. No respiratory distress.  Abdominal:     General: There is no distension.     Tenderness: There is no abdominal  tenderness.  Skin:    General: Skin is warm and dry.  Neurological:     General: No focal deficit present.     Mental Status: He is alert. Mental status is at baseline.  Psychiatric:        Mood and Affect: Mood normal.        Behavior: Behavior normal.    ED Results / Procedures / Treatments   Labs (all labs ordered are listed, but only abnormal results are displayed) Labs Reviewed  BASIC METABOLIC PANEL - Abnormal; Notable for the following components:      Result Value   Sodium 134 (*)    Glucose, Bld 335 (*)    BUN 22 (*)    All other components within normal limits  CBC - Abnormal; Notable for the following components:   RBC 5.96 (*)    RDW 16.3 (*)    All other components within normal limits  BASIC METABOLIC PANEL - Abnormal; Notable for the following components:   Glucose, Bld 152 (*)    BUN 21 (*)    All other components within normal limits  CBC - Abnormal; Notable for the following components:   RDW 15.6 (*)    All other components within normal limits  LIPID PANEL - Abnormal; Notable for the following components:   HDL 40 (*)    LDL Cholesterol 134 (*)    All other components within normal limits  GLUCOSE, CAPILLARY - Abnormal; Notable for the following components:   Glucose-Capillary 302 (*)    All other components within normal limits  GLUCOSE, CAPILLARY - Abnormal; Notable for the following components:   Glucose-Capillary 159 (*)    All other components within normal limits  TROPONIN I (HIGH SENSITIVITY) - Abnormal; Notable for the following components:   Troponin I (High Sensitivity) 31 (*)    All other components within normal limits  TROPONIN I (HIGH SENSITIVITY) - Abnormal; Notable for the following components:   Troponin I (High Sensitivity) 37 (*)    All other components within normal limits  RESP PANEL BY RT-PCR (FLU A&B, COVID) ARPGX2  MRSA NEXT GEN BY PCR, NASAL  BRAIN NATRIURETIC PEPTIDE  TSH  HEMOGLOBIN A1C    EKG EKG  Interpretation  Date/Time:  Friday October 14 2021 16:48:37 EST Ventricular Rate:  124 PR Interval:  164 QRS Duration: 97 QT Interval:  284 QTC Calculation: 408 R Axis:   7 Text Interpretation: Ectopic atrial tachycardia, unifocal Multiple ventricular premature complexes Anterior infarct, old Repol abnrm suggests ischemia, diffuse leads Confirmed by Octaviano Glow [phone]) on 10/14/2021 4:58:21 PM  Radiology DG Chest 2 View  Result Date: 10/14/2021 CLINICAL  DATA:  Shortness of breath.  Congestive heart failure. EXAM: CHEST - 2 VIEW COMPARISON:  12/12/2019 FINDINGS: A pacemaker seen with leads overlying the right atrium and right ventricle. No evidence of pneumothorax or pleural effusion. Both lungs are clear. IMPRESSION: No active cardiopulmonary disease. Electronically Signed   By: Marlaine Hind M.D.   On: 10/14/2021 17:40    Procedures .Critical Care Performed by: Wyvonnia Dusky, MD Authorized by: Wyvonnia Dusky, MD   Critical care provider statement:    Critical care time (minutes):  45   Critical care time was exclusive of:  Separately billable procedures and treating other patients   Critical care was necessary to treat or prevent imminent or life-threatening deterioration of the following conditions:  Circulatory failure   Critical care was time spent personally by me on the following activities:  Ordering and performing treatments and interventions, ordering and review of laboratory studies, ordering and review of radiographic studies, pulse oximetry, review of old charts, examination of patient and evaluation of patient's response to treatment Comments:     Rate control for a fib with rvr    Medications Ordered in ED Medications  sertraline (ZOLOFT) tablet 50 mg (0 mg Oral Duplicate 11/10/53 7322)  glipiZIDE (GLUCOTROL) tablet 5 mg (5 mg Oral Given 10/15/21 0742)  metFORMIN (GLUCOPHAGE) tablet 500 mg (500 mg Oral Given 10/15/21 0743)  apixaban (ELIQUIS) tablet 5 mg (0 mg  Oral Duplicate 0/25/42 7062)  spironolactone (ALDACTONE) tablet 25 mg (0 mg Oral Duplicate 3/76/28 3151)  metolazone (ZAROXOLYN) tablet 5 mg (5 mg Oral Given 10/15/21 0743)  sacubitril-valsartan (ENTRESTO) 24-26 mg per tablet (0 tablets Oral Duplicate 7/61/60 7371)  metoprolol succinate (TOPROL-XL) 24 hr tablet 25 mg (0 mg Oral Duplicate 0/62/69 4854)  atorvastatin (LIPITOR) tablet 10 mg (0 mg Oral Duplicate 02/14/02 5009)  empagliflozin (JARDIANCE) tablet 25 mg (has no administration in time range)  methocarbamol (ROBAXIN) tablet 500 mg (0 mg Oral Duplicate 3/81/82 9937)  acetaminophen (TYLENOL) tablet 650 mg (has no administration in time range)  ondansetron (ZOFRAN) injection 4 mg (has no administration in time range)  diltiazem (CARDIZEM) 125 mg in dextrose 5% 125 mL (1 mg/mL) infusion (0 mg/hr Intravenous Stopped 10/15/21 0915)  insulin aspart (novoLOG) injection 0-5 Units (4 Units Subcutaneous Given 10/15/21 0016)  insulin aspart (novoLOG) injection 0-15 Units (3 Units Subcutaneous Given 10/15/21 0742)  Chlorhexidine Gluconate Cloth 2 % PADS 6 each (0 each Topical Duplicate 1/69/67 8938)  furosemide (LASIX) tablet 20 mg (has no administration in time range)  metoprolol tartrate (LOPRESSOR) injection 5 mg (5 mg Intravenous Given 10/14/21 1751)  diltiazem (CARDIZEM) tablet 30 mg (30 mg Oral Given 10/14/21 2039)  diltiazem (CARDIZEM) 1 mg/mL load via infusion 10 mg (10 mg Intravenous Bolus from Bag 10/14/21 2145)    ED Course/ Medical Decision Making/ A&P Clinical Course as of 10/15/21 1009  Fri Oct 14, 2021  2033 We will try some oral diltiazem as patient's heart rate continues to fluctuate in the 100- 125 bpm. [MT]  2141 Admitted to hospitalist for A Fib with RVR, rate control - cardiology fellow on phone agreed with rate control meds, echo tomorrow if possible, and states cardiology team can be consulted in the morning if further concerns.  Pt updated and agreeable to stay.  HR remains 130 bpm  A Fib [MT]    Clinical Course User Index [MT] Jeanpaul Biehl, Carola Rhine, MD  Medical Decision Making Amount and/or Complexity of Data Reviewed Labs: ordered. Radiology: ordered.  Risk Prescription drug management. Decision regarding hospitalization.   Patient is here with tachydysrhythmia, palpitations.  He is stable on room air not requiring oxygen.  He does have significant nonischemic heart disease per my review of his external records.  Medtronic technician reports the patient did have a V. tach defibrillation episode on February 9.  He has had several runs of SVT with device mode change since then. Awaiting full report  [employer] report with 1 episode V Tach Feb 9, several svt/at episodes since then including now.  Labs personally reviewed, no significant findings; BNP low, trop low and flat; no acute anemia Xray personally interpreted, agree with radiologist, no PNA IV metoprolol PO diltiazem tried, unable to control rapid A Fib, pt started on diltiazem infusion,will admit to hospital  Stable on room air, no evidence of shock or hypoxia at the time of admission.  External records reviewed including [place] cardiology notes, most recent echo (EF 30%)        Final Clinical Impression(s) / ED Diagnoses Final diagnoses:  Atrial fibrillation with RVR [facility])    Rx / DC Orders ED Discharge Orders     None         Wyvonnia Dusky, MD 10/15/21 1010

## 2021-10-14 NOTE — H&P (Signed)
History and Physical    PatientMarland Kitchen Darryl Frye DOB: 01/19/1961 DOA: 10/14/2021 DOS: the patient was seen and examined on 10/14/2021 PCP: [name], FNP  Patient coming from: Home  Chief Complaint:  Chief Complaint  Patient presents with   Palpitations    HPI: Darryl Frye is a 61 y.o. male with medical history significant of heart failure with reduced EF of 30% 03/2021, A-fib/a flutter on anticoagulation with dual-chamber pacer and AICD.  He also has diabetes, hypertension.  Patient presents with shortness of breath, palpitations, dizziness that has been worsening over the past 2 to 3 days that is worse with exertion and when lying down flat.  These are improved when sitting upright or at rest.  The palpitations have been getting worse.  Denies chest pains, abdominal pains, fevers, chills, nausea, vomiting.  On presentation to the emergency department, the patient was found to be in atrial flutter with RVR.  Cardiology was consulted who felt the patient should be rate controlled and can be managed here.  Troponins were stable x2  Review of Systems: As mentioned in the history of present illness. All other systems reviewed and are negative. Past Medical History:  Diagnosis Date   Acute kidney injury ([place])    Acute respiratory failure (HCC)    Atrial flutter (HCC)    CHF (congestive heart failure) (HCC)    Coronary artery disease    Depression    Diabetes mellitus without complication (HCC)    DVT (deep venous thrombosis) (HCC)    Heart murmur    Hypertension    Renal disorder    Shock liver    Weak heart (HCC)    Past Surgical History:  Procedure Laterality Date   CYST REMOVAL NECK     HERNIA REPAIR     TONSILLECTOMY     Social History:  reports that he quit smoking about 2 years ago. His smoking use included cigarettes. He has a 20.00 pack-year smoking history. He has never used smokeless tobacco. He reports that he does not currently use alcohol. He reports  that he does not currently use drugs.  No Known Allergies  History reviewed. No pertinent family history.  Prior to Admission medications   Medication Sig Start Date End Date Taking? Authorizing Provider  apixaban (ELIQUIS) 5 MG TABS tablet Take 5 mg by mouth 2 (two) times daily.    [provider]  apixaban (ELIQUIS) 5 MG TABS tablet Take by mouth. 05/23/20   [provider]  atorvastatin (LIPITOR) 10 MG tablet Take by mouth. 03/23/21   [provider]  empagliflozin (JARDIANCE) 25 MG TABS tablet Take 1 tablet by mouth daily. 04/10/21 04/10/22  [provider]  furosemide (LASIX) 20 MG tablet Take 20 mg by mouth daily.    [provider]  furosemide (LASIX) 20 MG tablet Take by mouth. 10/25/19   [provider]  glipiZIDE (GLUCOTROL) 5 MG tablet Take 5 mg by mouth daily before breakfast.    [provider]  insulin glargine (LANTUS) 100 UNIT/ML Solostar Pen Inject into the skin. 04/10/21   [provider]  magnesium oxide (MAG-OX) 400 MG tablet Take 400 mg by mouth 2 (two) times daily as needed.    [provider]  magnesium oxide (MAG-OX) 400 MG tablet Take by mouth. 10/24/19   [provider]  metFORMIN (GLUCOPHAGE) 1000 MG tablet Take by mouth. 04/10/21 04/10/22  [provider]  metFORMIN (GLUCOPHAGE) 500 MG tablet Take 500 mg by mouth 2 (  two) times daily with a meal.    [provider]  metFORMIN (GLUCOPHAGE) 500 MG tablet Take 1 tablet by mouth 2 (two) times daily with a meal. 10/24/19   [provider]  methocarbamol (ROBAXIN) 500 MG tablet Take 1 tablet (500 mg total) by mouth 2 (two) times daily. 07/30/21   Truddie Hidden, MD  metolazone (ZAROXOLYN) 5 MG tablet Take 5 mg by mouth daily. Before Furosemide    [provider]  metolazone (ZAROXOLYN) 5 MG tablet Take by mouth. 04/10/21   [provider]  metoprolol succinate (TOPROL-XL) 25 MG 24 hr tablet Take by  mouth. 04/10/21 04/10/22  [provider]  metoprolol tartrate (LOPRESSOR) 25 MG tablet Take 25 mg by mouth daily.    [provider]  sacubitril-valsartan (ENTRESTO) 24-26 MG Take 1 tablet by mouth 2 (two) times daily.    [provider]  sertraline (ZOLOFT) 50 MG tablet Take 50 mg by mouth daily.    [provider]  sertraline (ZOLOFT) 50 MG tablet Take 1 tablet by mouth daily. 10/25/19   [provider]  spironolactone (ALDACTONE) 25 MG tablet Take 25 mg by mouth daily.    [provider]  spironolactone (ALDACTONE) 25 MG tablet Take 1 tablet by mouth daily. 04/10/21   [provider]    Physical Exam: Vitals:   10/14/21 2000 10/14/21 2030 10/14/21 2100 10/14/21 2130  BP: 111/82 110/82 100/77 115/77  Pulse: (!) 127 (!) 125 (!) 125 82  Resp: (!) 23 (!) 24 (!) 24 (!) 23  Temp:      TempSrc:      SpO2: 96% 98% 99% 99%  Weight:      Height:       General: Middle-age male. Awake and alert and oriented x3. No acute cardiopulmonary distress.  HEENT: Normocephalic atraumatic.  Right and left ears normal in appearance.  Pupils equal, round, reactive to light. Extraocular muscles are intact. Sclerae anicteric and noninjected.  Moist mucosal membranes. No mucosal lesions.  Neck: Neck supple without lymphadenopathy. No carotid bruits. No masses palpated.  Cardiovascular: Irregularly irregular rate with normal S1-S2 sounds. No murmurs, rubs, gallops auscultated. No JVD.  Respiratory: Good respiratory effort with no wheezes, rales, rhonchi. Lungs clear to auscultation bilaterally.  No accessory muscle use. Abdomen: Soft, nontender, nondistended. Active bowel sounds. No masses or hepatosplenomegaly  Skin: No rashes, lesions, or ulcerations.  Dry, warm to touch. 2+ dorsalis pedis and radial pulses. Musculoskeletal: No calf or leg pain. All major joints not erythematous nontender.  No upper or lower joint deformation.  Good ROM.  No contractures   Psychiatric: Intact judgment and insight. Pleasant and cooperative. Neurologic: No focal neurological deficits. Strength is 5/5 and symmetric in upper and lower extremities.  Cranial nerves II through XII are grossly intact.   Data Reviewed: Results for orders placed or performed during the hospital encounter of 10/14/21 (from the past 24 hour(s))  Basic metabolic panel     Status: Abnormal   Collection Time: 10/14/21  4:49 PM  Result Value Ref Range   Sodium 134 (L) 135 - 145 mmol/L   Potassium 4.4 3.5 - 5.1 mmol/L   Chloride 98 98 - 111 mmol/L   CO2 26 22 - 32 mmol/L   Glucose, Bld 335 (H) 70 - 99 mg/dL   BUN 22 (H) 6 - 20 mg/dL   Creatinine, Ser 1.24 0.61 - 1.24 mg/dL   Calcium 9.4 8.9 - 10.3 mg/dL   GFR, Estimated >60 >  60 mL/min   Anion gap 10 5 - 15  CBC     Status: Abnormal   Collection Time: 10/14/21  4:49 PM  Result Value Ref Range   WBC 6.4 4.0 - 10.5 K/uL   RBC 5.96 (H) 4.22 - 5.81 MIL/uL   Hemoglobin 16.2 13.0 - 17.0 g/dL   HCT 51.2 39.0 - 52.0 %   MCV 85.9 80.0 - 100.0 fL   MCH 27.2 26.0 - 34.0 pg   MCHC 31.6 30.0 - 36.0 g/dL   RDW 16.3 (H) 11.5 - 15.5 %   Platelets 353 150 - 400 K/uL   nRBC 0.0 0.0 - 0.2 %  Troponin I (High Sensitivity)     Status: Abnormal   Collection Time: 10/14/21  4:49 PM  Result Value Ref Range   Troponin I (High Sensitivity) 31 (H) <18 ng/L  Brain natriuretic peptide     Status: None   Collection Time: 10/14/21  4:50 PM  Result Value Ref Range   B Natriuretic Peptide 100.0 0.0 - 100.0 pg/mL  Troponin I (High Sensitivity)     Status: Abnormal   Collection Time: 10/14/21  6:34 PM  Result Value Ref Range   Troponin I (High Sensitivity) 37 (H) <18 ng/L   DG Chest 2 View  Result Date: 10/14/2021 CLINICAL DATA:  Shortness of breath.  Congestive heart failure. EXAM: CHEST - 2 VIEW COMPARISON:  12/12/2019 FINDINGS: A pacemaker seen with leads overlying the right atrium and right ventricle. No evidence of pneumothorax or pleural effusion.  Both lungs are clear. IMPRESSION: No active cardiopulmonary disease. Electronically Signed   By: Marlaine Hind M.D.   On: 10/14/2021 17:40    I personally reviewed the images of the chest x-ray.  No acute cardiopulmonary process, in particular no evidence of heart failure.  EKG personally reviewed: Atrial flutter present with no acute ST changes.  Assessment and Plan: No notes have been filed under this hospital service. Service: Hospitalist  Principal Problem:   Atrial fibrillation with RVR (HCC) Active Problems:   Chronic systolic CHF (congestive heart failure) (HCC)   DM (diabetes mellitus), type 2 ([place])   Essential hypertension   ICD (implantable cardioverter-defibrillator) discharge   Uncontrolled type 2 diabetes mellitus with hyperglycemia, without [place]man-term current use of insulin (HCC)  A-fib with RVR Observation on stepdown Telemetry monitoring Cardizem drip Continue Eliquis Last echo was approximately 6 months ago.  We will repeat tomorrow Heart failure with reduced EF with AICD and pacer Continue on diuretics Currently compensated Diabetes type 2 Continue home regimen As anticipate the patient will be discharged tomorrow, will continue oral diabetic medications in addition to insulin Sliding scale insulin and CBGs before meals and nightly Hypertension Continue home regimen    Advance Care Planning:   Code Status: Full Code   Consults: None  Family Communication: None  Severity of Illness: The appropriate patient status for this patient is OBSERVATION. Observation status is judged to be reasonable and necessary in order to provide the required intensity of service to ensure the patient's safety. The patient's presenting symptoms, physical exam findings, and initial radiographic and laboratory data in the context of their medical condition is felt to place them at decreased risk for further clinical deterioration. Furthermore, it is anticipated that the patient will  be medically stable for discharge from the hospital within 2 midnights of admission.   Author: Truett Mainland, DO 10/14/2021 10:01 PM  For on call review [email or web address].

## 2021-10-14 NOTE — ED Notes (Signed)
Urinal given to patient to use the bathroom

## 2021-10-14 NOTE — ED Notes (Signed)
[employer] pacemaker/defib interrogated.

## 2021-10-15 ENCOUNTER — Observation Stay (HOSPITAL_COMMUNITY): Payer: Medicaid Other

## 2021-10-15 DIAGNOSIS — I4891 Unspecified atrial fibrillation: Secondary | ICD-10-CM | POA: Diagnosis not present

## 2021-10-15 DIAGNOSIS — I4892 Unspecified atrial flutter: Secondary | ICD-10-CM | POA: Diagnosis not present

## 2021-10-15 LAB — BASIC METABOLIC PANEL
Anion gap: 7 (ref 5–15)
BUN: 21 mg/dL — ABNORMAL HIGH (ref 6–20)
CO2: 26 mmol/L (ref 22–32)
Calcium: 9.1 mg/dL (ref 8.9–10.3)
Chloride: 103 mmol/L (ref 98–111)
Creatinine, Ser: 1.15 mg/dL (ref 0.61–1.24)
GFR, Estimated: >60 mL/min (ref >60)
Glucose, Bld: 152 mg/dL — ABNORMAL HIGH (ref 70–99)
Potassium: 3.7 mmol/L (ref 3.5–5.1)
Sodium: 136 mmol/L (ref 135–145)

## 2021-10-15 LAB — LIPID PANEL
Cholesterol: 195 mg/dL (ref 0–200)
HDL: 40 mg/dL — ABNORMAL LOW (ref >40)
LDL Cholesterol: 134 mg/dL — ABNORMAL HIGH (ref 0–99)
Total CHOL/HDL Ratio: 4.9 RATIO
Triglycerides: 106 mg/dL (ref <150)
VLDL: 21 mg/dL (ref 0–40)

## 2021-10-15 LAB — ECHOCARDIOGRAM COMPLETE
AR max vel: 1.95 cm2
AV Area VTI: 2.21 cm2
AV Area mean vel: 1.79 cm2
AV Mean grad: 2 mmHg
AV Peak grad: 3.7 mmHg
Ao pk vel: 0.97 m/s
Height: 71 in
S' Lateral: 5.4 cm
Weight: 3241.64 oz

## 2021-10-15 LAB — GLUCOSE, CAPILLARY
Glucose-Capillary: 159 mg/dL — ABNORMAL HIGH (ref 70–99)
Glucose-Capillary: 185 mg/dL — ABNORMAL HIGH (ref 70–99)
Glucose-Capillary: 108 mg/dL — ABNORMAL HIGH (ref 70–99)
Glucose-Capillary: 246 mg/dL — ABNORMAL HIGH (ref 70–99)

## 2021-10-15 LAB — HEMOGLOBIN A1C
Hgb A1c MFr Bld: 9.7 % — ABNORMAL HIGH (ref 4.8–5.6)
Mean Plasma Glucose: 231.69 mg/dL

## 2021-10-15 LAB — CBC
HCT: 48.5 % (ref 39.0–52.0)
Hemoglobin: 15.2 g/dL (ref 13.0–17.0)
MCH: 26.4 pg (ref 26.0–34.0)
MCHC: 31.3 g/dL (ref 30.0–36.0)
MCV: 84.2 fL (ref 80.0–100.0)
Platelets: 344 10*3/uL (ref 150–400)
RBC: 5.76 MIL/uL (ref 4.22–5.81)
RDW: 15.6 % — ABNORMAL HIGH (ref 11.5–15.5)
WBC: 6.5 10*3/uL (ref 4.0–10.5)
nRBC: 0 % (ref 0.0–0.2)

## 2021-10-15 LAB — TSH: TSH: 2.723 u[IU]/mL (ref 0.350–4.500)

## 2021-10-15 LAB — MRSA NEXT GEN BY PCR, NASAL: MRSA by PCR Next Gen: DETECTED — AB

## 2021-10-15 MED ORDER — FUROSEMIDE 20 MG PO TABS
20.0000 mg | ORAL_TABLET | Freq: Two times a day (BID) | ORAL | Status: DC
  Administered 2021-10-15 – 2021-10-16 (×2): 20 mg via ORAL
  Filled 2021-10-15 (×2): qty 1

## 2021-10-15 MED ORDER — METOPROLOL TARTRATE 5 MG/5ML IV SOLN
5.0000 mg | Freq: Four times a day (QID) | INTRAVENOUS | Status: DC | PRN
  Administered 2021-10-15 – 2021-10-16 (×3): 5 mg via INTRAVENOUS
  Filled 2021-10-15 (×3): qty 5

## 2021-10-15 MED ORDER — PERFLUTREN LIPID MICROSPHERE
1.0000 mL | INTRAVENOUS | Status: AC | PRN
  Administered 2021-10-15: 2 mL via INTRAVENOUS
  Filled 2021-10-15: qty 10

## 2021-10-15 NOTE — Progress Notes (Signed)
PROGRESS NOTE    Darryl Frye  SWF:[id number] DOB: [birth year] DOA: 10/14/2021 PCP: [name], FNP   Brief Narrative:  Per HPI: Darryl Frye is a 61 y.o. male with medical history significant of heart failure with reduced EF of 30% 03/2021, A-fib/a flutter on anticoagulation with dual-chamber pacer and AICD.  He also has diabetes, hypertension.  Patient presents with shortness of breath, palpitations, dizziness that has been worsening over the past 2 to 3 days that is worse with exertion and when lying down flat.  These are improved when sitting upright or at rest.  The palpitations have been getting worse.  Denies chest pains, abdominal pains, fevers, chills, nausea, vomiting.  On presentation to the emergency department, the patient was found to be in atrial flutter with RVR.  Cardiology was consulted who felt the patient should be rate controlled and can be managed here.  Troponins were stable x2.  10/15/21: Patient admitted for atrial fibrillation/flutter with RVR and was initially started on Cardizem drip which was now been weaned off.  He continues to have good heart rate control at rest, but with exertion becomes quite symptomatic and has elevated heart rates.  Discussed case with cardiology Dr. Domenic Polite recommends cardioversion on 2/27 for better rate control.    Assessment & Plan:   Principal Problem:   Atrial fibrillation with RVR (HCC) Active Problems:   Chronic systolic CHF (congestive heart failure) (HCC)   DM (diabetes mellitus), type 2 ([place])   Essential hypertension   ICD (implantable cardioverter-defibrillator) discharge   Uncontrolled type 2 diabetes mellitus with hyperglycemia, without Kocher-term current use of insulin (HCC)  Assessment and Plan:  A-fib/flutter with RVR Continue monitoring in stepdown Telemetry monitoring Cardizem drip discontinued and now on home metoprolol XL Continue Eliquis Last echo was approximately 6 months ago with EF 30%, repeat 2D  echocardiogram with EF 20-25% likely due to the poor heart rate control Planning for cardioversion on 2/27 Heart failure with reduced EF with AICD and pacer Continue on diuretics with Lasix increased to twice daily as he appears to be on 40 mg daily dosing Currently compensated Diabetes type 2 Continue home regimen Will continue oral diabetic medications in addition to insulin Sliding scale insulin and CBGs before meals and nightly A1c 9.7% Hypertension Continue home regimen    DVT prophylaxis: Eliquis Code Status: Full Family Communication: None at bedside Disposition Plan:  Status is: Observation The patient will require care spanning > 2 midnights and should be moved to inpatient because: Need for close monitoring and cardioversion anticipated 2/27.    Consultants:  Discussed with cardiology 2/25  Procedures:  2D echocardiogram 2/25  Antimicrobials:  None   Subjective: Patient seen and evaluated today with no new acute complaints or concerns. No acute concerns or events noted overnight.  He continues to have some dizziness and shortness of breath with elevated heart rates with ambulation.  Objective: Vitals:   10/15/21 0748 10/15/21 0755 10/15/21 0800 10/15/21 1115  BP: 100/73  105/82   Pulse: 70 83 (!) 59   Resp: (!) 21 20 (!) 23   Temp:    (!) 97.5 F (36.4 C)  TempSrc:    Oral  SpO2: 96% 94% 92%   Weight:      Height:        Intake/Output Summary (Last 24 hours) at 10/15/2021 1433 Last data filed at 10/15/2021 1356 Gross per 24 hour  Intake 253.68 ml  Output 2700 ml  Net -2446.32 ml   [employer]  10/14/21 1646 10/14/21 2250  Weight: 90.7 kg 91.9 kg    Examination:  General exam: Appears calm and comfortable  Respiratory system: Clear to auscultation. Respiratory effort normal. Cardiovascular system: S1 & S2 heard, irregular. Gastrointestinal system: Abdomen is soft Central nervous system: Alert and awake Extremities: No edema Skin: No  significant lesions noted Psychiatry: Flat affect.    Data Reviewed: I have personally reviewed following labs and imaging studies  CBC: Recent Labs  Lab 10/14/21 1649 10/15/21 0500  WBC 6.4 6.5  HGB 16.2 15.2  HCT 51.2 48.5  MCV 85.9 84.2  PLT 353 347   Basic Metabolic Panel: Recent Labs  Lab 10/14/21 1649 10/15/21 0500  NA 134* 136  K 4.4 3.7  CL 98 103  CO2 26 26  GLUCOSE 335* 152*  BUN 22* 21*  CREATININE 1.24 1.15  CALCIUM 9.4 9.1   GFR: Estimated Creatinine Clearance: 81.1 mL/min (by C-G formula based on SCr of 1.15 mg/dL). Liver Function Tests: No results for input(s): AST, ALT, ALKPHOS, BILITOT, PROT, ALBUMIN in the last 168 hours. No results for input(s): LIPASE, AMYLASE in the last 168 hours. No results for input(s): AMMONIA in the last 168 hours. Coagulation Profile: No results for input(s): INR, PROTIME in the last 168 hours. Cardiac Enzymes: No results for input(s): CKTOTAL, CKMB, CKMBINDEX, TROPONINI in the last 168 hours. BNP (last 3 results) No results for input(s): PROBNP in the last 8760 hours. HbA1C: Recent Labs    10/15/21 0500  HGBA1C 9.7*   CBG: Recent Labs  Lab 10/14/21 2310 10/15/21 0720 10/15/21 1113  GLUCAP 302* 159* 185*   Lipid Profile: Recent Labs    10/15/21 0500  CHOL 195  HDL 40*  LDLCALC 134*  TRIG 106  CHOLHDL 4.9   Thyroid Function Tests: Recent Labs    10/15/21 0500  TSH 2.723   Anemia Panel: No results for input(s): VITAMINB12, FOLATE, FERRITIN, TIBC, IRON, RETICCTPCT in the last 72 hours. Sepsis Labs: No results for input(s): PROCALCITON, LATICACIDVEN in the last 168 hours.  Recent Results (from the past 240 hour(s))  Resp Panel by RT-PCR (Flu A&B, Covid) Nasopharyngeal Swab     Status: None   Collection Time: 10/14/21  9:44 PM   Specimen: Nasopharyngeal Swab; Nasopharyngeal(NP) swabs in vial transport medium  Result Value Ref Range Status   SARS Coronavirus 2 by RT PCR NEGATIVE NEGATIVE Final     Comment: (NOTE) SARS-CoV-2 target nucleic acids are NOT DETECTED.  The SARS-CoV-2 RNA is generally detectable in upper respiratory specimens during the acute phase of infection. The lowest concentration of SARS-CoV-2 viral copies this assay can detect is 138 copies/mL. A negative result does not preclude SARS-Cov-2 infection and should not be used as the sole basis for treatment or other patient management decisions. A negative result may occur with  improper specimen collection/handling, submission of specimen other than nasopharyngeal swab, presence of viral mutation(s) within the areas targeted by this assay, and inadequate number of viral copies(<138 copies/mL). A negative result must be combined with clinical observations, patient history, and epidemiological information. The expected result is Negative.  Fact Sheet for Patients:  EntrepreneurPulse.com.au  Fact Sheet for Healthcare Providers:  IncredibleEmployment.be  This test is no t yet approved or cleared by the [place] FDA and  has been authorized for detection and/or diagnosis of SARS-CoV-2 by FDA under an Emergency Use Authorization (EUA). This EUA will remain  in effect (meaning this test can be used) for the duration of the COVID-19 declaration  under Section 564(b)(1) of the Act, 21 U.S.C.section 360bbb-3(b)(1), unless the authorization is terminated  or revoked sooner.       Influenza A by PCR NEGATIVE NEGATIVE Final   Influenza B by PCR NEGATIVE NEGATIVE Final    Comment: (NOTE) The Xpert Xpress SARS-CoV-2/FLU/RSV plus assay is intended as an aid in the diagnosis of influenza from Nasopharyngeal swab specimens and should not be used as a sole basis for treatment. Nasal washings and aspirates are unacceptable for Xpert Xpress SARS-CoV-2/FLU/RSV testing.  Fact Sheet for Patients: EntrepreneurPulse.com.au  Fact Sheet for Healthcare  Providers: IncredibleEmployment.be  This test is not yet approved or cleared by the [place] FDA and has been authorized for detection and/or diagnosis of SARS-CoV-2 by FDA under an Emergency Use Authorization (EUA). This EUA will remain in effect (meaning this test can be used) for the duration of the COVID-19 declaration under Section 564(b)(1) of the Act, 21 U.S.C. section 360bbb-3(b)(1), unless the authorization is terminated or revoked.  Performed at [facility], [address] [place], [place] [address]          Radiology Studies: DG Chest 2 View  Result Date: 10/14/2021 CLINICAL DATA:  Shortness of breath.  Congestive heart failure. EXAM: CHEST - 2 VIEW COMPARISON:  12/12/2019 FINDINGS: A pacemaker seen with leads overlying the right atrium and right ventricle. No evidence of pneumothorax or pleural effusion. Both lungs are clear. IMPRESSION: No active cardiopulmonary disease. Electronically Signed   By: Marlaine Hind M.D.   On: 10/14/2021 17:40   ECHOCARDIOGRAM COMPLETE  Result Date: 10/15/2021    ECHOCARDIOGRAM REPORT   Patient Name:   Darryl Frye Date of Exam: 10/15/2021 Medical Rec #:  [id number]    Height:       71.0 in Accession #:    [id number]   Weight:       202.6 lb Date of Birth:  13-May-1961    BSA:          2.120 m Patient Age:    72 years     BP:           113/77 mmHg Patient Gender: M            HR:           66 bpm. Exam Location:  [facility] Procedure: 2D Echo, Cardiac Doppler, Color Doppler and Intracardiac            Opacification Agent Indications:    Atrial Flutter I48.92  History:        Patient has no prior history of Echocardiogram examinations.                 CHF; Defibrillator.  Sonographer:    Merrie Roof RDCS Referring Phys: [place]  1. Left ventricular ejection fraction, by estimation, is 20 to 25%. The left ventricle has severely decreased function. The left ventricle demonstrates global hypokinesis. The  left ventricular internal cavity size was mildly dilated. Left ventricular diastolic function could not be evaluated.  2. Right ventricular systolic function is normal. The right ventricular size is normal. Tricuspid regurgitation signal is inadequate for assessing PA pressure.  3. The pericardial effusion is posterior to the left ventricle.  4. The mitral valve is normal in structure. Trivial mitral valve regurgitation. No evidence of mitral stenosis.  5. The aortic valve is normal in structure. Aortic valve regurgitation is not visualized. No aortic stenosis is present.  6. Aortic dilatation noted. There is mild dilatation of  the aortic root, measuring 39 mm. FINDINGS  Left Ventricle: Left ventricular ejection fraction, by estimation, is 20 to 25%. The left ventricle has severely decreased function. The left ventricle demonstrates global hypokinesis. The left ventricular internal cavity size was mildly dilated. There is no left ventricular hypertrophy. Left ventricular diastolic function could not be evaluated due to atrial fibrillation. Left ventricular diastolic function could not be evaluated. Right Ventricle: The right ventricular size is normal. No increase in right ventricular wall thickness. Right ventricular systolic function is normal. Tricuspid regurgitation signal is inadequate for assessing PA pressure. Left Atrium: Left atrial size was normal in size. Right Atrium: Right atrial size was normal in size. Pericardium: Trivial pericardial effusion is present. The pericardial effusion is posterior to the left ventricle. Mitral Valve: The mitral valve is normal in structure. Trivial mitral valve regurgitation. No evidence of mitral valve stenosis. Tricuspid Valve: The tricuspid valve is normal in structure. Tricuspid valve regurgitation is trivial. No evidence of tricuspid stenosis. Aortic Valve: The aortic valve is normal in structure. Aortic valve regurgitation is not visualized. No aortic stenosis is  present. Aortic valve mean gradient measures 2.0 mmHg. Aortic valve peak gradient measures 3.7 mmHg. Aortic valve area, by VTI measures 2.21 cm. Pulmonic Valve: The pulmonic valve was normal in structure. Pulmonic valve regurgitation is not visualized. No evidence of pulmonic stenosis. Aorta: Aortic dilatation noted. There is mild dilatation of the aortic root, measuring 39 mm. Venous: The inferior vena cava was not well visualized. IAS/Shunts: No atrial level shunt detected by color flow Doppler. Additional Comments: A device lead is visualized.  LEFT VENTRICLE PLAX 2D LVIDd:         5.80 cm LVIDs:         5.40 cm LV PW:         1.10 cm LV IVS:        0.90 cm LVOT diam:     2.00 cm LV SV:         36 LV SV Index:   17 LVOT Area:     3.14 cm  RIGHT VENTRICLE RV Basal diam:  3.40 cm RV S prime:     7.94 cm/s TAPSE (M-mode): 1.2 cm LEFT ATRIUM             Index        RIGHT ATRIUM           Index LA diam:        3.30 cm 1.56 cm/m   RA Area:     16.70 cm LA Vol (A2C):   51.3 ml 24.20 ml/m  RA Volume:   48.10 ml  22.69 ml/m LA Vol (A4C):   45.6 ml 21.51 ml/m LA Biplane Vol: 50.1 ml 23.63 ml/m  AORTIC VALVE AV Area (Vmax):    1.95 cm AV Area (Vmean):   1.79 cm AV Area (VTI):     2.21 cm AV Vmax:           96.50 cm/s AV Vmean:          64.600 cm/s AV VTI:            0.165 m AV Peak Grad:      3.7 mmHg AV Mean Grad:      2.0 mmHg LVOT Vmax:         60.00 cm/s LVOT Vmean:        36.800 cm/s LVOT VTI:          0.116 m LVOT/AV VTI ratio: 0.70  AORTA Ao Root diam: 3.90 cm Ao Asc diam:  3.20 cm  SHUNTS Systemic VTI:  0.12 m Systemic Diam: 2.00 cm Fransico Him MD Electronically signed by Fransico Him MD Signature Date/Time: 10/15/2021/11:47:19 AM    Final         Scheduled Meds:  apixaban  5 mg Oral BID   atorvastatin  10 mg Oral Daily   Chlorhexidine Gluconate Cloth  6 each Topical Q0600   empagliflozin  25 mg Oral Daily   furosemide  20 mg Oral BID   glipiZIDE  5 mg Oral QAC breakfast   insulin aspart   0-15 Units Subcutaneous TID WC   insulin aspart  0-5 Units Subcutaneous QHS   metFORMIN  500 mg Oral BID WC   methocarbamol  500 mg Oral BID   metolazone  5 mg Oral Daily   metoprolol succinate  25 mg Oral Daily   sacubitril-valsartan  1 tablet Oral BID   sertraline  50 mg Oral Daily   spironolactone  25 mg Oral Daily   Continuous Infusions:  diltiazem (CARDIZEM) infusion Stopped (10/15/21 0915)     LOS: 0 days    Time spent: 35 minutes    Darryl Frye Darleen Crocker, DO [facility]  If 7PM-7AM, please contact night-coverage [email or web address] 10/15/2021, 2:33 PM

## 2021-10-15 NOTE — Progress Notes (Signed)
Cardizem gtt stopped at this time, NSR & heart rate 65, MD notified

## 2021-10-15 NOTE — Progress Notes (Signed)
Patient c/o feeling SOB, O2 saturation 95% on RA. Patient's lung sounds clear. Patient has been sustaining around 130 BPM in atrial flutter, asymptomatic, off cardizem gtt.Restarted patient's cardizem gtt and placed pt on 2L O2 [place] for comfort, will continue to monitor.

## 2021-10-15 NOTE — Hospital Course (Addendum)
Per HPI: Darryl Frye is a 61 y.o. male with medical history significant of heart failure with reduced EF of 30% 03/2021, A-fib/a flutter on anticoagulation with dual-chamber pacer and AICD.  He also has diabetes, hypertension.  Patient presents with shortness of breath, palpitations, dizziness that has been worsening over the past 2 to 3 days that is worse with exertion and when lying down flat.  These are improved when sitting upright or at rest.  The palpitations have been getting worse.  Denies chest pains, abdominal pains, fevers, chills, nausea, vomiting.  On presentation to the emergency department, the patient was found to be in atrial flutter with RVR.  Cardiology was consulted who felt the patient should be rate controlled and can be managed here.  Troponins were stable x2.  10/15/21: Patient admitted for atrial fibrillation/flutter with RVR and was initially started on Cardizem drip which was now been weaned off.  He continues to have good heart rate control at rest, but with exertion becomes quite symptomatic and has elevated heart rates.  Discussed case with cardiology Dr. Domenic Polite recommends cardioversion on 2/27 for better rate control.  10/16/21: Patient has been restarted on Cardizem drip due to elevated heart rates and is starting to have some mild hypotension noted.  Diuretics and Delene Loll will be discontinued for now.  Anticipating cardioversion in a.m.  10/17/21: Patient was started on amiodarone 2/26 and has converted to sinus rhythm this AM.  No further need for cardioversion at this time.  He will be converted to oral amiodarone later this evening with anticipated discharge in a.m.

## 2021-10-15 NOTE — Progress Notes (Signed)
°  Echocardiogram 2D Echocardiogram with contrast has been performed.  Merrie Roof F 10/15/2021, 11:00 AM

## 2021-10-16 DIAGNOSIS — Z7901 Long term (current) use of anticoagulants: Secondary | ICD-10-CM | POA: Diagnosis not present

## 2021-10-16 DIAGNOSIS — Z87891 Personal history of nicotine dependence: Secondary | ICD-10-CM | POA: Diagnosis not present

## 2021-10-16 DIAGNOSIS — R002 Palpitations: Secondary | ICD-10-CM | POA: Diagnosis present

## 2021-10-16 DIAGNOSIS — I4891 Unspecified atrial fibrillation: Secondary | ICD-10-CM | POA: Diagnosis present

## 2021-10-16 DIAGNOSIS — N179 Acute kidney failure, unspecified: Secondary | ICD-10-CM | POA: Diagnosis present

## 2021-10-16 DIAGNOSIS — E1165 Type 2 diabetes mellitus with hyperglycemia: Secondary | ICD-10-CM | POA: Diagnosis present

## 2021-10-16 DIAGNOSIS — I428 Other cardiomyopathies: Secondary | ICD-10-CM | POA: Diagnosis present

## 2021-10-16 DIAGNOSIS — Z95810 Presence of automatic (implantable) cardiac defibrillator: Secondary | ICD-10-CM | POA: Diagnosis not present

## 2021-10-16 DIAGNOSIS — I959 Hypotension, unspecified: Secondary | ICD-10-CM | POA: Diagnosis present

## 2021-10-16 DIAGNOSIS — I2510 Atherosclerotic heart disease of native coronary artery without angina pectoris: Secondary | ICD-10-CM | POA: Diagnosis present

## 2021-10-16 DIAGNOSIS — I110 Hypertensive heart disease with heart failure: Secondary | ICD-10-CM | POA: Diagnosis present

## 2021-10-16 DIAGNOSIS — Z79899 Other long term (current) drug therapy: Secondary | ICD-10-CM | POA: Diagnosis not present

## 2021-10-16 DIAGNOSIS — F32A Depression, unspecified: Secondary | ICD-10-CM | POA: Diagnosis present

## 2021-10-16 DIAGNOSIS — I4892 Unspecified atrial flutter: Secondary | ICD-10-CM | POA: Diagnosis present

## 2021-10-16 DIAGNOSIS — Z794 Long term (current) use of insulin: Secondary | ICD-10-CM | POA: Diagnosis not present

## 2021-10-16 DIAGNOSIS — Z7984 Long term (current) use of oral hypoglycemic drugs: Secondary | ICD-10-CM | POA: Diagnosis not present

## 2021-10-16 DIAGNOSIS — I5022 Chronic systolic (congestive) heart failure: Secondary | ICD-10-CM | POA: Diagnosis present

## 2021-10-16 DIAGNOSIS — Z20822 Contact with and (suspected) exposure to covid-19: Secondary | ICD-10-CM | POA: Diagnosis present

## 2021-10-16 LAB — GLUCOSE, CAPILLARY
Glucose-Capillary: 285 mg/dL — ABNORMAL HIGH (ref 70–99)
Glucose-Capillary: 267 mg/dL — ABNORMAL HIGH (ref 70–99)
Glucose-Capillary: 124 mg/dL — ABNORMAL HIGH (ref 70–99)
Glucose-Capillary: 233 mg/dL — ABNORMAL HIGH (ref 70–99)

## 2021-10-16 MED ORDER — AMIODARONE HCL IN DEXTROSE 360-4.14 MG/200ML-% IV SOLN
30.0000 mg/h | INTRAVENOUS | Status: AC
  Administered 2021-10-16: 17:00:00 30 mg/h via INTRAVENOUS
  Administered 2021-10-17: 60 mg/h via INTRAVENOUS
  Administered 2021-10-17: 30 mg/h via INTRAVENOUS
  Filled 2021-10-16 (×5): qty 200

## 2021-10-16 MED ORDER — AMIODARONE HCL IN DEXTROSE 360-4.14 MG/200ML-% IV SOLN
60.0000 mg/h | INTRAVENOUS | Status: AC
  Administered 2021-10-16: 60 mg/h via INTRAVENOUS
  Filled 2021-10-16: qty 200

## 2021-10-16 NOTE — Progress Notes (Unsigned)
Referring Provider:McCorkle, Nettie Elm, FNP Primary Care Physician:  [name], FNP Primary Gastroenterologist:  Dr. Rayne Du chief complaint on file.   HPI:   Darryl Frye is a 61 y.o. male presenting today at the request of [name], [place] for consult colonoscopy.   Admitted with a fib with RVR, planning for cardioversion on 2.27. Will need OV in a couple of months to allow time for follow-up with cardiology outpatient.   Past Medical History:  Diagnosis Date   Acute kidney injury ([place])    Acute respiratory failure (HCC)    Atrial flutter (HCC)    CHF (congestive heart failure) (HCC)    Coronary artery disease    Diabetes mellitus without complication (HCC)    DVT (deep venous thrombosis) (HCC)    Heart murmur    Hypertension    Presence of permanent cardiac pacemaker    Renal disorder    Weak heart (HCC)     Past Surgical History:  Procedure Laterality Date   CYST REMOVAL NECK     HERNIA REPAIR     TONSILLECTOMY      No current facility-administered medications for this visit.   No current outpatient medications on file.   Facility-Administered Medications Ordered in Other Visits  Medication Dose Route Frequency Provider Last Rate Last Admin   acetaminophen (TYLENOL) tablet 650 mg  650 mg Oral Q4H PRN Truett Mainland, DO       apixaban (ELIQUIS) tablet 5 mg  5 mg Oral BID Truett Mainland, DO   5 mg at 10/15/21 2019   atorvastatin (LIPITOR) tablet 10 mg  10 mg Oral Daily Truett Mainland, DO   10 mg at 10/15/21 1017   Chlorhexidine Gluconate Cloth 2 % PADS 6 each  6 each Topical Q0600 Adefeso, [place], DO   6 each at 10/16/21 0421   diltiazem (CARDIZEM) 125 mg in dextrose 5% 125 mL (1 mg/mL) infusion  5-15 mg/hr Intravenous Titrated Truett Mainland, DO 5 mL/hr at 10/15/21 2138 5 mg/hr at 10/15/21 2138   empagliflozin (JARDIANCE) tablet 25 mg  25 mg Oral Daily Truett Mainland, DO   25 mg at 10/15/21 1118   furosemide (LASIX) tablet 20 mg  20 mg Oral  BID Heath Lark D, DO   20 mg at 10/15/21 1719   glipiZIDE (GLUCOTROL) tablet 5 mg  5 mg Oral QAC breakfast Truett Mainland, DO   5 mg at 10/15/21 5102   insulin aspart (novoLOG) injection 0-15 Units  0-15 Units Subcutaneous TID WC Truett Mainland, DO   3 Units at 10/15/21 1119   insulin aspart (novoLOG) injection 0-5 Units  0-5 Units Subcutaneous QHS Truett Mainland, DO   2 Units at 10/15/21 2140   metFORMIN (GLUCOPHAGE) tablet 500 mg  500 mg Oral BID WC Truett Mainland, DO   500 mg at 10/15/21 1719   methocarbamol (ROBAXIN) tablet 500 mg  500 mg Oral BID Truett Mainland, DO   500 mg at 10/15/21 2019   metolazone (ZAROXOLYN) tablet 5 mg  5 mg Oral Daily Truett Mainland, DO   5 mg at 10/15/21 5852   metoprolol succinate (TOPROL-XL) 24 hr tablet 25 mg  25 mg Oral Daily Truett Mainland, DO   25 mg at 10/15/21 0748   metoprolol tartrate (LOPRESSOR) injection 5 mg  5 mg Intravenous Q6H PRN Heath Lark D, DO   5 mg at 10/16/21 0638   ondansetron (ZOFRAN) injection 4 mg  4 mg Intravenous Q6H PRN Truett Mainland, DO       sacubitril-valsartan (ENTRESTO) 24-26 mg per tablet  1 tablet Oral BID Truett Mainland, DO   1 tablet at 10/15/21 2019   sertraline (ZOLOFT) tablet 50 mg  50 mg Oral Daily Truett Mainland, DO   50 mg at 10/15/21 0071   spironolactone (ALDACTONE) tablet 25 mg  25 mg Oral Daily Truett Mainland, DO   25 mg at 10/15/21 2197    Allergies as of 10/17/2021   (No Known Allergies)    No family history on file.  Social History   Socioeconomic History   Marital status: Divorced    Spouse name: Not on file   Number of children: Not on file   Years of education: Not on file   Highest education level: Not on file  Occupational History   Not on file  Tobacco Use   Smoking status: Former    Packs/day: 1.00    Years: 20.00    Pack years: 20.00    Types: Cigarettes    Quit date: 07/07/2019    Years since quitting: 2.2   Smokeless tobacco: Never  Substance and Sexual  Activity   Alcohol use: Not Currently   Drug use: Not Currently   Sexual activity: Not on file  Other Topics Concern   Not on file  Social History Narrative   Not on file   Social Determinants of Health   Financial Resource Strain: Not on file  Food Insecurity: Not on file  Transportation Needs: Not on file  Physical Activity: Not on file  Stress: Not on file  Social Connections: Not on file  Intimate Partner Violence: Not on file    Review of Systems: Gen: Denies any fever, chills, fatigue, weight loss, lack of appetite.  CV: Denies chest pain, heart palpitations, peripheral edema, syncope.  Resp: Denies shortness of breath at rest or with exertion. Denies wheezing or cough.  GI: Denies dysphagia or odynophagia. Denies jaundice, hematemesis, fecal incontinence. GU : Denies urinary burning, urinary frequency, urinary hesitancy MS: Denies joint pain, muscle weakness, cramps, or limitation of movement.  Derm: Denies rash, itching, dry skin Psych: Denies depression, anxiety, memory loss, and confusion Heme: Denies bruising, bleeding, and enlarged lymph nodes.  Physical Exam: There were no vitals taken for this visit. General:   Alert and oriented. Pleasant and cooperative. Well-nourished and well-developed.  Head:  Normocephalic and atraumatic. Eyes:  Without icterus, sclera clear and conjunctiva pink.  Ears:  Normal auditory acuity. Nose:  No deformity, discharge,  or lesions. Mouth:  No deformity or lesions, oral mucosa pink.  Neck:  Supple, without mass or thyromegaly. Lungs:  Clear to auscultation bilaterally. No wheezes, rales, or rhonchi. No distress.  Heart:  S1, S2 present without murmurs appreciated.  Abdomen:  +BS, soft, non-tender and non-distended. No HSM noted. No guarding or rebound. No masses appreciated.  Rectal:  Deferred  Msk:  Symmetrical without gross deformities. Normal posture. Pulses:  Normal pulses noted. Extremities:  Without clubbing or  edema. Neurologic:  Alert and  oriented x4;  grossly normal neurologically. Skin:  Intact without significant lesions or rashes. Cervical Nodes:  No significant cervical adenopathy. Psych:  Alert and cooperative. Normal mood and affect.

## 2021-10-16 NOTE — TOC Progression Note (Signed)
Transition of Care [facility]) - Progression Note    Patient Details  Name: Darryl Frye MRN: [id number] Date of Birth: Apr 05, 1961  Transition of [facility]) CM/SW Contact  Salome Arnt, [place] Phone Number: 10/16/2021, 11:14 AM  Clinical Narrative:   Transition of Care [facility]) Screening Note   Patient Details  Name: Darryl Frye Date of Birth: 1960-10-16   Transition of Care [facility]) CM/SW Contact:    Salome Arnt, [place] Phone Number: 10/16/2021, 11:14 AM    Transition of Care Department [facility]) has reviewed patient and no TOC needs have been identified at this time. We will continue to monitor patient advancement through interdisciplinary progression rounds. If new patient transition needs arise, please place a TOC consult.         Barriers to Discharge: Continued Medical Work up  Expected Discharge Plan and Services                                                 Social Determinants of Health (SDOH) Interventions    Readmission Risk Interventions No flowsheet data found.

## 2021-10-16 NOTE — Progress Notes (Signed)
PROGRESS NOTE    [name]  VXB:[id number] DOB: [birth year] DOA: 10/14/2021 PCP: [name], FNP   Brief Narrative:  Per HPI: Darryl Frye is a 61 y.o. male with medical history significant of heart failure with reduced EF of 30% 03/2021, A-fib/a flutter on anticoagulation with dual-chamber pacer and AICD.  He also has diabetes, hypertension.  Patient presents with shortness of breath, palpitations, dizziness that has been worsening over the past 2 to 3 days that is worse with exertion and when lying down flat.  These are improved when sitting upright or at rest.  The palpitations have been getting worse.  Denies chest pains, abdominal pains, fevers, chills, nausea, vomiting.  On presentation to the emergency department, the patient was found to be in atrial flutter with RVR.  Cardiology was consulted who felt the patient should be rate controlled and can be managed here.  Troponins were stable x2.  10/15/21: Patient admitted for atrial fibrillation/flutter with RVR and was initially started on Cardizem drip which was now been weaned off.  He continues to have good heart rate control at rest, but with exertion becomes quite symptomatic and has elevated heart rates.  Discussed case with cardiology Dr. Domenic Polite recommends cardioversion on 2/27 for better rate control.  10/16/21: Patient has been restarted on Cardizem drip due to elevated heart rates and is starting to have some mild hypotension noted.  Diuretics and Delene Loll will be discontinued for now.  Anticipating cardioversion in a.m.    Assessment & Plan:   Principal Problem:   Atrial fibrillation with RVR (HCC) Active Problems:   Chronic systolic CHF (congestive heart failure) (HCC)   DM (diabetes mellitus), type 2 ([place])   Essential hypertension   ICD (implantable cardioverter-defibrillator) discharge   Uncontrolled type 2 diabetes mellitus with hyperglycemia, without [place]-term current use of insulin (HCC)  Assessment and  Plan:     A-fib/flutter with RVR Continue monitoring in stepdown Telemetry monitoring Cardizem drip restarted and he continues to remain on metoprolol XL; if he continues to have persistent hypotension may need to discontinue Cardizem and switch to amiodarone Continue Eliquis Last echo was approximately 6 months ago with EF 30%, repeat 2D echocardiogram with EF 20-25% likely due to the poor heart rate control Planning for cardioversion on 2/27 Heart failure with reduced EF with AICD and pacer Discontinue diuretics and Entresto with noted hypotension, but did receive these this morning; may require small fluid bolus Currently compensated Diabetes type 2 Continue home regimen Will continue oral diabetic medications in addition to insulin Sliding scale insulin and CBGs before meals and nightly A1c 9.7% Hypertension Continue home regimen    DVT prophylaxis: Eliquis Code Status: Full Family Communication: None at bedside Disposition Plan:  Status is: Observation The patient will require care spanning > 2 midnights and should be moved to inpatient because: Need for IV medications.   Consultants:  Discussed with cardiology 2/25   Procedures:  2D echocardiogram 2/25   Antimicrobials:  None   Subjective: Patient seen and evaluated today with no new acute complaints or concerns. No acute concerns or events noted overnight.  He is noted to have some mild hypotension this morning.  Objective: Vitals:   10/16/21 0800 10/16/21 0811 10/16/21 0900 10/16/21 0930  BP: 115/88 115/88 (!) 86/42 (!) 80/62  Pulse: (!) 131 91 (!) 36 65  Resp: 15  19 15   Temp:      TempSrc:      SpO2: 96%  96% 96%  Weight:  Height:        Intake/Output Summary (Last 24 hours) at 10/16/2021 1033 Last data filed at 10/16/2021 0917 Gross per 24 hour  Intake 127.24 ml  Output 4150 ml  Net -4022.76 ml   Filed Weights   10/14/21 1646 10/14/21 2250 10/16/21 0500  Weight: 90.7 kg 91.9 kg 90.1 kg     Examination:  General exam: Appears calm and comfortable  Respiratory system: Clear to auscultation. Respiratory effort normal.  Currently on 1 L nasal cannula. Cardiovascular system: S1 & S2 heard, irregular and tachycardic Gastrointestinal system: Abdomen is soft Central nervous system: Alert and awake Extremities: No edema Skin: No significant lesions noted Psychiatry: Flat affect.    Data Reviewed: I have personally reviewed following labs and imaging studies  CBC: Recent Labs  Lab 10/14/21 1649 10/15/21 0500  WBC 6.4 6.5  HGB 16.2 15.2  HCT 51.2 48.5  MCV 85.9 84.2  PLT 353 355   Basic Metabolic Panel: Recent Labs  Lab 10/14/21 1649 10/15/21 0500  NA 134* 136  K 4.4 3.7  CL 98 103  CO2 26 26  GLUCOSE 335* 152*  BUN 22* 21*  CREATININE 1.24 1.15  CALCIUM 9.4 9.1   GFR: Estimated Creatinine Clearance: 74.6 mL/min (by C-G formula based on SCr of 1.15 mg/dL). Liver Function Tests: No results for input(s): AST, ALT, ALKPHOS, BILITOT, PROT, ALBUMIN in the last 168 hours. No results for input(s): LIPASE, AMYLASE in the last 168 hours. No results for input(s): AMMONIA in the last 168 hours. Coagulation Profile: No results for input(s): INR, PROTIME in the last 168 hours. Cardiac Enzymes: No results for input(s): CKTOTAL, CKMB, CKMBINDEX, TROPONINI in the last 168 hours. BNP (last 3 results) No results for input(s): PROBNP in the last 8760 hours. HbA1C: Recent Labs    10/15/21 0500  HGBA1C 9.7*   CBG: Recent Labs  Lab 10/15/21 0720 10/15/21 1113 10/15/21 1607 10/15/21 2137 10/16/21 0728  GLUCAP 159* 185* 108* 246* 285*   Lipid Profile: Recent Labs    10/15/21 0500  CHOL 195  HDL 40*  LDLCALC 134*  TRIG 106  CHOLHDL 4.9   Thyroid Function Tests: Recent Labs    10/15/21 0500  TSH 2.723   Anemia Panel: No results for input(s): VITAMINB12, FOLATE, FERRITIN, TIBC, IRON, RETICCTPCT in the last 72 hours. Sepsis Labs: No results for  input(s): PROCALCITON, LATICACIDVEN in the last 168 hours.  Recent Results (from the past 240 hour(s))  Resp Panel by RT-PCR (Flu A&B, Covid) Nasopharyngeal Swab     Status: None   Collection Time: 10/14/21  9:44 PM   Specimen: Nasopharyngeal Swab; Nasopharyngeal(NP) swabs in vial transport medium  Result Value Ref Range Status   SARS Coronavirus 2 by RT PCR NEGATIVE NEGATIVE Final    Comment: (NOTE) SARS-CoV-2 target nucleic acids are NOT DETECTED.  The SARS-CoV-2 RNA is generally detectable in upper respiratory specimens during the acute phase of infection. The lowest concentration of SARS-CoV-2 viral copies this assay can detect is 138 copies/mL. A negative result does not preclude SARS-Cov-2 infection and should not be used as the sole basis for treatment or other patient management decisions. A negative result may occur with  improper specimen collection/handling, submission of specimen other than nasopharyngeal swab, presence of viral mutation(s) within the areas targeted by this assay, and inadequate number of viral copies(<138 copies/mL). A negative result must be combined with clinical observations, patient history, and epidemiological information. The expected result is Negative.  Fact Sheet for Patients:  EntrepreneurPulse.com.au  Fact Sheet for Healthcare Providers:  IncredibleEmployment.be  This test is no t yet approved or cleared by the [place] FDA and  has been authorized for detection and/or diagnosis of SARS-CoV-2 by FDA under an Emergency Use Authorization (EUA). This EUA will remain  in effect (meaning this test can be used) for the duration of the COVID-19 declaration under Section 564(b)(1) of the Act, 21 U.S.C.section 360bbb-3(b)(1), unless the authorization is terminated  or revoked sooner.       Influenza A by PCR NEGATIVE NEGATIVE Final   Influenza B by PCR NEGATIVE NEGATIVE Final    Comment: (NOTE) The  Xpert Xpress SARS-CoV-2/FLU/RSV plus assay is intended as an aid in the diagnosis of influenza from Nasopharyngeal swab specimens and should not be used as a sole basis for treatment. Nasal washings and aspirates are unacceptable for Xpert Xpress SARS-CoV-2/FLU/RSV testing.  Fact Sheet for Patients: EntrepreneurPulse.com.au  Fact Sheet for Healthcare Providers: IncredibleEmployment.be  This test is not yet approved or cleared by the [place] FDA and has been authorized for detection and/or diagnosis of SARS-CoV-2 by FDA under an Emergency Use Authorization (EUA). This EUA will remain in effect (meaning this test can be used) for the duration of the COVID-19 declaration under Section 564(b)(1) of the Act, 21 U.S.C. section 360bbb-3(b)(1), unless the authorization is terminated or revoked.  Performed at [facility], [address], [place], [place] [address]   MRSA Next Gen by PCR, Nasal     Status: Abnormal   Collection Time: 10/14/21 10:59 PM   Specimen: Nasal Mucosa; Nasal Swab  Result Value Ref Range Status   MRSA by PCR Next Gen DETECTED (A) NOT DETECTED Final    Comment: RESULT CALLED TO, READ BACK BY AND VERIFIED WITH: HYLTON,L @ 6629 ON 10/15/21 BY JUW (NOTE) The GeneXpert MRSA Assay (FDA approved for NASAL specimens only), is one component of a comprehensive MRSA colonization surveillance program. It is not intended to diagnose MRSA infection nor to guide or monitor treatment for MRSA infections. Test performance is not FDA approved in patients less than 58 years old. Performed at [facility], [address], [place],  [address]          Radiology Studies: DG Chest 2 View  Result Date: 10/14/2021 CLINICAL DATA:  Shortness of breath.  Congestive heart failure. EXAM: CHEST - 2 VIEW COMPARISON:  12/12/2019 FINDINGS: A pacemaker seen with leads overlying the right atrium and right ventricle. No evidence of pneumothorax or  pleural effusion. Both lungs are clear. IMPRESSION: No active cardiopulmonary disease. Electronically Signed   By: Marlaine Hind M.D.   On: 10/14/2021 17:40   ECHOCARDIOGRAM COMPLETE  Result Date: 10/15/2021    ECHOCARDIOGRAM REPORT   Patient Name:   Darryl Frye Date of Exam: 10/15/2021 Medical Rec #:  [id number]    Height:       71.0 in Accession #:    [id number]   Weight:       202.6 lb Date of Birth:  30-Jan-1961    BSA:          2.120 m Patient Age:    86 years     BP:           113/77 mmHg Patient Gender: M            HR:           66 bpm. Exam Location:  [facility] Procedure: 2D Echo, Cardiac Doppler, Color Doppler and Intracardiac  Opacification Agent Indications:    Atrial Flutter I48.92  History:        Patient has no prior history of Echocardiogram examinations.                 CHF; Defibrillator.  Sonographer:    Merrie Roof RDCS Referring Phys: [place]  1. Left ventricular ejection fraction, by estimation, is 20 to 25%. The left ventricle has severely decreased function. The left ventricle demonstrates global hypokinesis. The left ventricular internal cavity size was mildly dilated. Left ventricular diastolic function could not be evaluated.  2. Right ventricular systolic function is normal. The right ventricular size is normal. Tricuspid regurgitation signal is inadequate for assessing PA pressure.  3. The pericardial effusion is posterior to the left ventricle.  4. The mitral valve is normal in structure. Trivial mitral valve regurgitation. No evidence of mitral stenosis.  5. The aortic valve is normal in structure. Aortic valve regurgitation is not visualized. No aortic stenosis is present.  6. Aortic dilatation noted. There is mild dilatation of the aortic root, measuring 39 mm. FINDINGS  Left Ventricle: Left ventricular ejection fraction, by estimation, is 20 to 25%. The left ventricle has severely decreased function. The left ventricle demonstrates global  hypokinesis. The left ventricular internal cavity size was mildly dilated. There is no left ventricular hypertrophy. Left ventricular diastolic function could not be evaluated due to atrial fibrillation. Left ventricular diastolic function could not be evaluated. Right Ventricle: The right ventricular size is normal. No increase in right ventricular wall thickness. Right ventricular systolic function is normal. Tricuspid regurgitation signal is inadequate for assessing PA pressure. Left Atrium: Left atrial size was normal in size. Right Atrium: Right atrial size was normal in size. Pericardium: Trivial pericardial effusion is present. The pericardial effusion is posterior to the left ventricle. Mitral Valve: The mitral valve is normal in structure. Trivial mitral valve regurgitation. No evidence of mitral valve stenosis. Tricuspid Valve: The tricuspid valve is normal in structure. Tricuspid valve regurgitation is trivial. No evidence of tricuspid stenosis. Aortic Valve: The aortic valve is normal in structure. Aortic valve regurgitation is not visualized. No aortic stenosis is present. Aortic valve mean gradient measures 2.0 mmHg. Aortic valve peak gradient measures 3.7 mmHg. Aortic valve area, by VTI measures 2.21 cm. Pulmonic Valve: The pulmonic valve was normal in structure. Pulmonic valve regurgitation is not visualized. No evidence of pulmonic stenosis. Aorta: Aortic dilatation noted. There is mild dilatation of the aortic root, measuring 39 mm. Venous: The inferior vena cava was not well visualized. IAS/Shunts: No atrial level shunt detected by color flow Doppler. Additional Comments: A device lead is visualized.  LEFT VENTRICLE PLAX 2D LVIDd:         5.80 cm LVIDs:         5.40 cm LV PW:         1.10 cm LV IVS:        0.90 cm LVOT diam:     2.00 cm LV SV:         36 LV SV Index:   17 LVOT Area:     3.14 cm  RIGHT VENTRICLE RV Basal diam:  3.40 cm RV S prime:     7.94 cm/s TAPSE (M-mode): 1.2 cm LEFT ATRIUM              Index        RIGHT ATRIUM           Index LA diam:  3.30 cm 1.56 cm/m   RA Area:     16.70 cm LA Vol (A2C):   51.3 ml 24.20 ml/m  RA Volume:   48.10 ml  22.69 ml/m LA Vol (A4C):   45.6 ml 21.51 ml/m LA Biplane Vol: 50.1 ml 23.63 ml/m  AORTIC VALVE AV Area (Vmax):    1.95 cm AV Area (Vmean):   1.79 cm AV Area (VTI):     2.21 cm AV Vmax:           96.50 cm/s AV Vmean:          64.600 cm/s AV VTI:            0.165 m AV Peak Grad:      3.7 mmHg AV Mean Grad:      2.0 mmHg LVOT Vmax:         60.00 cm/s LVOT Vmean:        36.800 cm/s LVOT VTI:          0.116 m LVOT/AV VTI ratio: 0.70  AORTA Ao Root diam: 3.90 cm Ao Asc diam:  3.20 cm  SHUNTS Systemic VTI:  0.12 m Systemic Diam: 2.00 cm Fransico Him MD Electronically signed by Fransico Him MD Signature Date/Time: 10/15/2021/11:47:19 AM    Final         Scheduled Meds:  apixaban  5 mg Oral BID   atorvastatin  10 mg Oral Daily   Chlorhexidine Gluconate Cloth  6 each Topical Q0600   empagliflozin  25 mg Oral Daily   glipiZIDE  5 mg Oral QAC breakfast   insulin aspart  0-15 Units Subcutaneous TID WC   insulin aspart  0-5 Units Subcutaneous QHS   metFORMIN  500 mg Oral BID WC   methocarbamol  500 mg Oral BID   metoprolol succinate  25 mg Oral Daily   sertraline  50 mg Oral Daily   Continuous Infusions:  diltiazem (CARDIZEM) infusion 5 mg/hr (10/16/21 0758)     LOS: 0 days    Time spent: 35 minutes    Kimberlyann Hollar Darleen Crocker, DO [facility]  If 7PM-7AM, please contact night-coverage [email or web address] 10/16/2021, 10:33 AM

## 2021-10-17 ENCOUNTER — Ambulatory Visit: Payer: Medicaid Other | Admitting: Gastroenterology

## 2021-10-17 ENCOUNTER — Encounter: Payer: Self-pay | Admitting: Internal Medicine

## 2021-10-17 DIAGNOSIS — I5022 Chronic systolic (congestive) heart failure: Secondary | ICD-10-CM

## 2021-10-17 DIAGNOSIS — I4892 Unspecified atrial flutter: Secondary | ICD-10-CM

## 2021-10-17 LAB — BASIC METABOLIC PANEL
Anion gap: 13 (ref 5–15)
BUN: 34 mg/dL — ABNORMAL HIGH (ref 6–20)
CO2: 28 mmol/L (ref 22–32)
Calcium: 9.6 mg/dL (ref 8.9–10.3)
Chloride: 92 mmol/L — ABNORMAL LOW (ref 98–111)
Creatinine, Ser: 1.44 mg/dL — ABNORMAL HIGH (ref 0.61–1.24)
GFR, Estimated: 56 mL/min — ABNORMAL LOW (ref >60)
Glucose, Bld: 194 mg/dL — ABNORMAL HIGH (ref 70–99)
Potassium: 3.6 mmol/L (ref 3.5–5.1)
Sodium: 133 mmol/L — ABNORMAL LOW (ref 135–145)

## 2021-10-17 LAB — GLUCOSE, CAPILLARY
Glucose-Capillary: 268 mg/dL — ABNORMAL HIGH (ref 70–99)
Glucose-Capillary: 214 mg/dL — ABNORMAL HIGH (ref 70–99)
Glucose-Capillary: 122 mg/dL — ABNORMAL HIGH (ref 70–99)
Glucose-Capillary: 178 mg/dL — ABNORMAL HIGH (ref 70–99)

## 2021-10-17 LAB — MAGNESIUM: Magnesium: 1.8 mg/dL (ref 1.7–2.4)

## 2021-10-17 MED ORDER — MAGNESIUM SULFATE 2 GM/50ML IV SOLN
2.0000 g | Freq: Once | INTRAVENOUS | Status: AC
  Administered 2021-10-17: 2 g via INTRAVENOUS
  Filled 2021-10-17: qty 50

## 2021-10-17 MED ORDER — AMIODARONE HCL 200 MG PO TABS
400.0000 mg | ORAL_TABLET | Freq: Two times a day (BID) | ORAL | Status: DC
  Administered 2021-10-17 – 2021-10-18 (×2): 400 mg via ORAL
  Filled 2021-10-17 (×2): qty 2

## 2021-10-17 MED ORDER — SACUBITRIL-VALSARTAN 24-26 MG PO TABS: 1.0000 | ORAL_TABLET | Freq: Two times a day (BID) | ORAL | Status: DC

## 2021-10-17 MED ORDER — METOPROLOL SUCCINATE ER 25 MG PO TB24
25.0000 mg | ORAL_TABLET | Freq: Every day | ORAL | Status: DC
Start: 2021-10-17 — End: 2021-10-18
  Administered 2021-10-17 – 2021-10-18 (×2): 25 mg via ORAL
  Filled 2021-10-17 (×2): qty 1

## 2021-10-17 MED ORDER — MUPIROCIN 2 % EX OINT
1.0000 "application " | TOPICAL_OINTMENT | Freq: Two times a day (BID) | CUTANEOUS | Status: DC
  Administered 2021-10-17 – 2021-10-18 (×3): 1 via NASAL
  Filled 2021-10-17: qty 22

## 2021-10-17 MED ORDER — POTASSIUM CHLORIDE CRYS ER 20 MEQ PO TBCR
40.0000 meq | EXTENDED_RELEASE_TABLET | Freq: Once | ORAL | Status: AC
  Administered 2021-10-17: 40 meq via ORAL
  Filled 2021-10-17: qty 2

## 2021-10-17 NOTE — Progress Notes (Signed)
PROGRESS NOTE    Darryl Frye  KYH:[id number] DOB: 1961/02/18 DOA: 10/14/2021 PCP: [name], FNP   Brief Narrative:  Per HPI: Darryl Frye is a 61 y.o. male with medical history significant of heart failure with reduced EF of 30% 03/2021, A-fib/a flutter on anticoagulation with dual-chamber pacer and AICD.  He also has diabetes, hypertension.  Patient presents with shortness of breath, palpitations, dizziness that has been worsening over the past 2 to 3 days that is worse with exertion and when lying down flat.  These are improved when sitting upright or at rest.  The palpitations have been getting worse.  Denies chest pains, abdominal pains, fevers, chills, nausea, vomiting.  On presentation to the emergency department, the patient was found to be in atrial flutter with RVR.  Cardiology was consulted who felt the patient should be rate controlled and can be managed here.  Troponins were stable x2.  10/15/21: Patient admitted for atrial fibrillation/flutter with RVR and was initially started on Cardizem drip which was now been weaned off.  He continues to have good heart rate control at rest, but with exertion becomes quite symptomatic and has elevated heart rates.  Discussed case with cardiology Dr. Domenic Polite recommends cardioversion on 2/27 for better rate control.  10/16/21: Patient has been restarted on Cardizem drip due to elevated heart rates and is starting to have some mild hypotension noted.  Diuretics and Delene Loll will be discontinued for now.  Anticipating cardioversion in a.m.  10/17/21: Patient was started on amiodarone 2/26 and has converted to sinus rhythm this AM.  No further need for cardioversion at this time.  He will be converted to oral amiodarone later this evening with anticipated discharge in a.m.    Assessment & Plan:   Principal Problem:   Atrial fibrillation with RVR (HCC) Active Problems:   Chronic systolic CHF (congestive heart failure) (HCC)   DM (diabetes  mellitus), type 2 ([place])   Essential hypertension   ICD (implantable cardioverter-defibrillator) discharge   Uncontrolled type 2 diabetes mellitus with hyperglycemia, without Goda-term current use of insulin (HCC)  Assessment and Plan:    A-fib/flutter with RVR-now in NSR Continue monitoring in stepdown with plans to convert IV amiodarone to p.o. later today Telemetry monitoring Continue Eliquis Last echo was approximately 6 months ago with EF 30%, repeat 2D echocardiogram with EF 20-25% likely due to the poor heart rate control Appreciate cardiology recommendations Heart failure with reduced EF with AICD and pacer Elevated creatinine for which discontinued diuretics and Entresto with noted hypotension, but did receive these this morning Currently compensated Diabetes type 2 Continue home regimen Will continue oral diabetic medications in addition to insulin Sliding scale insulin and CBGs before meals and nightly A1c 9.7% Hypertension Holding home diuretics Noted to have soft blood pressure readings in the last 24-48 hours    DVT prophylaxis: Eliquis Code Status: Full Family Communication: None at bedside Disposition Plan:  Status is: Inpatient Remains inpatient appropriate because: Need for IV medications.   Consultants:  Cardiology   Procedures:  2D echocardiogram 2/25   Antimicrobials:  None   Subjective: Patient seen and evaluated today with no new acute complaints or concerns. No acute concerns or events noted overnight.  He has converted to sinus rhythm overnight and does not require cardioversion.  Objective: Vitals:   10/17/21 0600 10/17/21 0630 10/17/21 0700 10/17/21 0800  BP:  108/79 117/90 121/80  Pulse: 69 76 81 71  Resp: 13 14 18  (!) 21  Temp:   97.9  F (36.6 C)   TempSrc:   Oral   SpO2: 98% 98% 95% 97%  Weight:      Height:        Intake/Output Summary (Last 24 hours) at 10/17/2021 0903 Last data filed at 10/17/2021 8676 Gross per 24 hour   Intake 151.74 ml  Output 3400 ml  Net -3248.26 ml   Filed Weights   10/14/21 2250 10/16/21 0500 10/17/21 0500  Weight: 91.9 kg 90.1 kg 88.9 kg    Examination:  General exam: Appears calm and comfortable  Respiratory system: Clear to auscultation. Respiratory effort normal. Cardiovascular system: S1 & S2 heard, RRR.  Gastrointestinal system: Abdomen is soft Central nervous system: Alert and awake Extremities: No edema Skin: No significant lesions noted Psychiatry: Flat affect.    Data Reviewed: I have personally reviewed following labs and imaging studies  CBC: Recent Labs  Lab 10/14/21 1649 10/15/21 0500  WBC 6.4 6.5  HGB 16.2 15.2  HCT 51.2 48.5  MCV 85.9 84.2  PLT 353 195   Basic Metabolic Panel: Recent Labs  Lab 10/14/21 1649 10/15/21 0500 10/17/21 0433  NA 134* 136 133*  K 4.4 3.7 3.6  CL 98 103 92*  CO2 26 26 28   GLUCOSE 335* 152* 194*  BUN 22* 21* 34*  CREATININE 1.24 1.15 1.44*  CALCIUM 9.4 9.1 9.6  MG  --   --  1.8   GFR: Estimated Creatinine Clearance: 59.6 mL/min (A) (by C-G formula based on SCr of 1.44 mg/dL (H)). Liver Function Tests: No results for input(s): AST, ALT, ALKPHOS, BILITOT, PROT, ALBUMIN in the last 168 hours. No results for input(s): LIPASE, AMYLASE in the last 168 hours. No results for input(s): AMMONIA in the last 168 hours. Coagulation Profile: No results for input(s): INR, PROTIME in the last 168 hours. Cardiac Enzymes: No results for input(s): CKTOTAL, CKMB, CKMBINDEX, TROPONINI in the last 168 hours. BNP (last 3 results) No results for input(s): PROBNP in the last 8760 hours. HbA1C: Recent Labs    10/15/21 0500  HGBA1C 9.7*   CBG: Recent Labs  Lab 10/16/21 0728 10/16/21 1116 10/16/21 1600 10/16/21 2124 10/17/21 0721  GLUCAP 285* 267* 124* 233* 268*   Lipid Profile: Recent Labs    10/15/21 0500  CHOL 195  HDL 40*  LDLCALC 134*  TRIG 106  CHOLHDL 4.9   Thyroid Function Tests: Recent Labs     10/15/21 0500  TSH 2.723   Anemia Panel: No results for input(s): VITAMINB12, FOLATE, FERRITIN, TIBC, IRON, RETICCTPCT in the last 72 hours. Sepsis Labs: No results for input(s): PROCALCITON, LATICACIDVEN in the last 168 hours.  Recent Results (from the past 240 hour(s))  Resp Panel by RT-PCR (Flu A&B, Covid) Nasopharyngeal Swab     Status: None   Collection Time: 10/14/21  9:44 PM   Specimen: Nasopharyngeal Swab; Nasopharyngeal(NP) swabs in vial transport medium  Result Value Ref Range Status   SARS Coronavirus 2 by RT PCR NEGATIVE NEGATIVE Final    Comment: (NOTE) SARS-CoV-2 target nucleic acids are NOT DETECTED.  The SARS-CoV-2 RNA is generally detectable in upper respiratory specimens during the acute phase of infection. The lowest concentration of SARS-CoV-2 viral copies this assay can detect is 138 copies/mL. A negative result does not preclude SARS-Cov-2 infection and should not be used as the sole basis for treatment or other patient management decisions. A negative result may occur with  improper specimen collection/handling, submission of specimen other than nasopharyngeal swab, presence of viral mutation(s) within the areas  targeted by this assay, and inadequate number of viral copies(<138 copies/mL). A negative result must be combined with clinical observations, patient history, and epidemiological information. The expected result is Negative.  Fact Sheet for Patients:  EntrepreneurPulse.com.au  Fact Sheet for Healthcare Providers:  IncredibleEmployment.be  This test is no t yet approved or cleared by the [place] FDA and  has been authorized for detection and/or diagnosis of SARS-CoV-2 by FDA under an Emergency Use Authorization (EUA). This EUA will remain  in effect (meaning this test can be used) for the duration of the COVID-19 declaration under Section 564(b)(1) of the Act, 21 U.S.C.section 360bbb-3(b)(1), unless the  authorization is terminated  or revoked sooner.       Influenza A by PCR NEGATIVE NEGATIVE Final   Influenza B by PCR NEGATIVE NEGATIVE Final    Comment: (NOTE) The Xpert Xpress SARS-CoV-2/FLU/RSV plus assay is intended as an aid in the diagnosis of influenza from Nasopharyngeal swab specimens and should not be used as a sole basis for treatment. Nasal washings and aspirates are unacceptable for Xpert Xpress SARS-CoV-2/FLU/RSV testing.  Fact Sheet for Patients: EntrepreneurPulse.com.au  Fact Sheet for Healthcare Providers: IncredibleEmployment.be  This test is not yet approved or cleared by the [place] FDA and has been authorized for detection and/or diagnosis of SARS-CoV-2 by FDA under an Emergency Use Authorization (EUA). This EUA will remain in effect (meaning this test can be used) for the duration of the COVID-19 declaration under Section 564(b)(1) of the Act, 21 U.S.C. section 360bbb-3(b)(1), unless the authorization is terminated or revoked.  Performed at [facility], [address] [place], [place] [address]   MRSA Next Gen by PCR, Nasal     Status: Abnormal   Collection Time: 10/14/21 10:59 PM   Specimen: Nasal Mucosa; Nasal Swab  Result Value Ref Range Status   MRSA by PCR Next Gen DETECTED (A) NOT DETECTED Final    Comment: RESULT CALLED TO, READ BACK BY AND VERIFIED WITH: HYLTON,L @ 5427 ON 10/15/21 BY JUW (NOTE) The GeneXpert MRSA Assay (FDA approved for NASAL specimens only), is one component of a comprehensive MRSA colonization surveillance program. It is not intended to diagnose MRSA infection nor to guide or monitor treatment for MRSA infections. Test performance is not FDA approved in patients less than 27 years old. Performed at [facility], [address] [place],  [address]          Radiology Studies: ECHOCARDIOGRAM COMPLETE  Result Date: 10/15/2021    ECHOCARDIOGRAM REPORT   Patient Name:    Darryl Frye Date of Exam: 10/15/2021 Medical Rec #:  [id number]    Height:       71.0 in Accession #:    [id number]   Weight:       202.6 lb Date of Birth:  06/08/1961    BSA:          2.120 m Patient Age:    60 years     BP:           113/77 mmHg Patient Gender: M            HR:           66 bpm. Exam Location:  [facility] Procedure: 2D Echo, Cardiac Doppler, Color Doppler and Intracardiac            Opacification Agent Indications:    Atrial Flutter I48.92  History:        Patient has no prior history of Echocardiogram examinations.  CHF; Defibrillator.  Sonographer:    Merrie Roof RDCS Referring Phys: [place]  1. Left ventricular ejection fraction, by estimation, is 20 to 25%. The left ventricle has severely decreased function. The left ventricle demonstrates global hypokinesis. The left ventricular internal cavity size was mildly dilated. Left ventricular diastolic function could not be evaluated.  2. Right ventricular systolic function is normal. The right ventricular size is normal. Tricuspid regurgitation signal is inadequate for assessing PA pressure.  3. The pericardial effusion is posterior to the left ventricle.  4. The mitral valve is normal in structure. Trivial mitral valve regurgitation. No evidence of mitral stenosis.  5. The aortic valve is normal in structure. Aortic valve regurgitation is not visualized. No aortic stenosis is present.  6. Aortic dilatation noted. There is mild dilatation of the aortic root, measuring 39 mm. FINDINGS  Left Ventricle: Left ventricular ejection fraction, by estimation, is 20 to 25%. The left ventricle has severely decreased function. The left ventricle demonstrates global hypokinesis. The left ventricular internal cavity size was mildly dilated. There is no left ventricular hypertrophy. Left ventricular diastolic function could not be evaluated due to atrial fibrillation. Left ventricular diastolic function could not be  evaluated. Right Ventricle: The right ventricular size is normal. No increase in right ventricular wall thickness. Right ventricular systolic function is normal. Tricuspid regurgitation signal is inadequate for assessing PA pressure. Left Atrium: Left atrial size was normal in size. Right Atrium: Right atrial size was normal in size. Pericardium: Trivial pericardial effusion is present. The pericardial effusion is posterior to the left ventricle. Mitral Valve: The mitral valve is normal in structure. Trivial mitral valve regurgitation. No evidence of mitral valve stenosis. Tricuspid Valve: The tricuspid valve is normal in structure. Tricuspid valve regurgitation is trivial. No evidence of tricuspid stenosis. Aortic Valve: The aortic valve is normal in structure. Aortic valve regurgitation is not visualized. No aortic stenosis is present. Aortic valve mean gradient measures 2.0 mmHg. Aortic valve peak gradient measures 3.7 mmHg. Aortic valve area, by VTI measures 2.21 cm. Pulmonic Valve: The pulmonic valve was normal in structure. Pulmonic valve regurgitation is not visualized. No evidence of pulmonic stenosis. Aorta: Aortic dilatation noted. There is mild dilatation of the aortic root, measuring 39 mm. Venous: The inferior vena cava was not well visualized. IAS/Shunts: No atrial level shunt detected by color flow Doppler. Additional Comments: A device lead is visualized.  LEFT VENTRICLE PLAX 2D LVIDd:         5.80 cm LVIDs:         5.40 cm LV PW:         1.10 cm LV IVS:        0.90 cm LVOT diam:     2.00 cm LV SV:         36 LV SV Index:   17 LVOT Area:     3.14 cm  RIGHT VENTRICLE RV Basal diam:  3.40 cm RV S prime:     7.94 cm/s TAPSE (M-mode): 1.2 cm LEFT ATRIUM             Index        RIGHT ATRIUM           Index LA diam:        3.30 cm 1.56 cm/m   RA Area:     16.70 cm LA Vol (A2C):   51.3 ml 24.20 ml/m  RA Volume:   48.10 ml  22.69 ml/m LA Vol (A4C):  45.6 ml 21.51 ml/m LA Biplane Vol: 50.1 ml 23.63  ml/m  AORTIC VALVE AV Area (Vmax):    1.95 cm AV Area (Vmean):   1.79 cm AV Area (VTI):     2.21 cm AV Vmax:           96.50 cm/s AV Vmean:          64.600 cm/s AV VTI:            0.165 m AV Peak Grad:      3.7 mmHg AV Mean Grad:      2.0 mmHg LVOT Vmax:         60.00 cm/s LVOT Vmean:        36.800 cm/s LVOT VTI:          0.116 m LVOT/AV VTI ratio: 0.70  AORTA Ao Root diam: 3.90 cm Ao Asc diam:  3.20 cm  SHUNTS Systemic VTI:  0.12 m Systemic Diam: 2.00 cm Fransico Him MD Electronically signed by Fransico Him MD Signature Date/Time: 10/15/2021/11:47:19 AM    Final         Scheduled Meds:  amiodarone  400 mg Oral BID   apixaban  5 mg Oral BID   atorvastatin  10 mg Oral Daily   Chlorhexidine Gluconate Cloth  6 each Topical Q0600   empagliflozin  25 mg Oral Daily   glipiZIDE  5 mg Oral QAC breakfast   insulin aspart  0-15 Units Subcutaneous TID WC   insulin aspart  0-5 Units Subcutaneous QHS   metFORMIN  500 mg Oral BID WC   methocarbamol  500 mg Oral BID   metoprolol succinate  25 mg Oral Daily   mupirocin ointment  1 application Nasal BID   potassium chloride  40 mEq Oral Once   sertraline  50 mg Oral Daily   Continuous Infusions:  amiodarone 30 mg/hr (10/17/21 0838)   magnesium sulfate bolus IVPB       LOS: 1 day    Time spent: 35 minutes    Sundus Pete Darleen Crocker, DO [facility]  If 7PM-7AM, please contact night-coverage [email or web address] 10/17/2021, 9:03 AM

## 2021-10-17 NOTE — Progress Notes (Addendum)
Per [employer] rep, ICD interrogation showed that the patient's audible alarm that he'd heard was due to episode of 1:1 atrial flutter on 10/14/21 as high as 231bpm. ICD was able to discern this was atrial activity so his device did not attempt to treat it, but is set up to alarm given the high HR episode that occurred. Otherwise device showed intermittent atrial flutter over the past year (which patient has hx of) as well as 1 shock on 09/29/21 for HR of 250bpm at 8:04pm - this was logged as VF in the device report but per rep, the morphology was more suggestive of atrial flutter 1:1 similar to the above findings - because of the HR range and persistence of arrhythmia his device delivered 1 shock at that time.

## 2021-10-17 NOTE — Progress Notes (Signed)
Inpatient Diabetes Program Recommendations  AACE/ADA: New Consensus Statement on Inpatient Glycemic Control (2015)  Target Ranges:  Prepandial:   less than 140 mg/dL      Peak postprandial:   less than 180 mg/dL (1-2 hours)      Critically ill patients:  140 - 180 mg/dL   Lab Results  Component Value Date   GLUCAP 268 (H) 10/17/2021   HGBA1C 9.7 (H) 10/15/2021    Review of Glycemic Control  Diabetes history: DM 2 Outpatient Diabetes medications: Jardiance 25 mg Daily, Glipizide 5 mg Daily, Lantus 32 units, Metformin 1000 mg bid Current orders for Inpatient glycemic control:  Jardiance 25 mg Daily Metformin 500 mg bid Glipizde 5 mg Daily Novolog 0-15 units tid + hs  A1c 9.7% on 2/25  Spoke with pt over the phone. Pt reports last going to his PCP the end of December and had his metformin increased. Pts next appt is the end of March. Discussed current A1c. Pt is compliant with diet at home per report. Discussed importance of glucose control in the future. Encouraged follow up with PCP. Advised pt to call PCP office for adjustments prior to visit in March.....  Inpatient Diabetes Program Recommendations:   - Pt would benefit from adding some basal insulin to regimen -  Semglee 15 units  Thanks,  Tama Headings RN, MSN, BC-ADM Inpatient Diabetes Coordinator Team Pager [phone] (8a-5p)

## 2021-10-17 NOTE — Consult Note (Addendum)
Cardiology Consultation:   Patient ID: Darryl Frye MRN: [id number]; DOB: 1961/07/13  Admit date: 10/14/2021 Date of Consult: 10/17/2021  PCP:  [name], [place] Providers Cardiologist:  Followed at [facility] here to update MD or APP on Care Team, Refresh:1}     Patient Profile:   Darryl Frye is a 61 y.o. male with a hx of chronic systolic HF/NICM, afib/aflutter  who is being seen 10/17/2021 for the evaluation of tachycardia at the request of Dr Manuella Ghazi.  History of Present Illness:   Darryl Frye 61 yo male history of NICM LVEF 20-25% by 10/2019 echo, dual chamber AICD with prior AICD, afib/aflutter, DM2 presented with palpitations. Found to be in aflutter with RVR on presentation. Was started on IV diltiazem gtt initially for rate control.     Admit labs K 4.4 BUN 22 Cr 1.24 WBC 6.4 Hgb 16.2 Plt 353 BNP 100 TSH 2.7  Trop 31-->37 COVID neg Flu neg CXR no acute process EKG typical aflutter with RVR 09/2021 echo: LVEF 20-25%   07/2019 [facility] Cath: normal coronaries 03/2021 echo [facility] : LVEF 30%, mild RV dysfunction  Past Medical History:  Diagnosis Date   Acute kidney injury ([place])    Acute respiratory failure (HCC)    Atrial flutter (HCC)    CHF (congestive heart failure) (HCC)    Coronary artery disease    Diabetes mellitus without complication (HCC)    DVT (deep venous thrombosis) (HCC)    Heart murmur    Hypertension    Presence of permanent cardiac pacemaker    Renal disorder    Weak heart (HCC)     Past Surgical History:  Procedure Laterality Date   CYST REMOVAL NECK     HERNIA REPAIR     TONSILLECTOMY        Inpatient Medications: Scheduled Meds:  apixaban  5 mg Oral BID   atorvastatin  10 mg Oral Daily   Chlorhexidine Gluconate Cloth  6 each Topical Q0600   empagliflozin  25 mg Oral Daily   glipiZIDE  5 mg Oral QAC breakfast   insulin aspart  0-15 Units Subcutaneous TID WC   insulin aspart  0-5 Units Subcutaneous QHS   metFORMIN   500 mg Oral BID WC   methocarbamol  500 mg Oral BID   sertraline  50 mg Oral Daily   Continuous Infusions:  amiodarone 60 mg/hr (10/17/21 0500)   PRN Meds: acetaminophen, metoprolol tartrate, ondansetron (ZOFRAN) IV  Allergies:   No Known Allergies  Social History:   Social History   Socioeconomic History   Marital status: Divorced    Spouse name: Not on file   Number of children: Not on file   Years of education: Not on file   Highest education level: Not on file  Occupational History   Not on file  Tobacco Use   Smoking status: Former    Packs/day: 1.00    Years: 20.00    Pack years: 20.00    Types: Cigarettes    Quit date: 07/07/2019    Years since quitting: 2.2   Smokeless tobacco: Never  Substance and Sexual Activity   Alcohol use: Not Currently   Drug use: Not Currently   Sexual activity: Not on file  Other Topics Concern   Not on file  Social History Narrative   Not on file   Social Determinants of Health   Financial Resource Strain: Not on file  Food Insecurity: Not on file  Transportation Needs: Not  on file  Physical Activity: Not on file  Stress: Not on file  Social Connections: Not on file  Intimate Partner Violence: Not on file    Family History:   History reviewed. No pertinent family history.   ROS:  Please see the history of present illness.   All other ROS reviewed and negative.     Physical Exam/Data:   Vitals:   10/17/21 0500 10/17/21 0600 10/17/21 0630 10/17/21 0700  BP: 120/77  108/79   Pulse: 69 69 76   Resp: 15 13 14    Temp:    97.9 F (36.6 C)  TempSrc:    Oral  SpO2: 98% 98% 98%   Weight: 88.9 kg     Height:        Intake/Output Summary (Last 24 hours) at 10/17/2021 0758 Last data filed at 10/17/2021 0500 Gross per 24 hour  Intake 151.74 ml  Output 3100 ml  Net -2948.26 ml   Last 3 Weights 10/17/2021 10/16/2021 10/14/2021  Weight (lbs) 195 lb 15.8 oz 198 lb 10.2 oz 202 lb 9.6 oz  Weight (kg) 88.9 kg 90.1 kg 91.9 kg      Body mass index is 27.33 kg/m.  General:  Well nourished, well developed, in no acute distress HEENT: normal Neck: no JVD Vascular: No carotid bruits; Distal pulses 2+ bilaterally Cardiac:  normal S1, S2; RRR; no murmur  Lungs:  clear to auscultation bilaterally, no wheezing, rhonchi or rales  Abd: soft, nontender, no hepatomegaly  Ext: no edema Musculoskeletal:  No deformities, BUE and BLE strength normal and equal Skin: warm and dry  Neuro:  CNs 2-12 intact, no focal abnormalities noted Psych:  Normal affect   EKG:  The EKG was personally reviewed and demonstrates:   Telemetry:  Telemetry was personally reviewed and demonstrates:      Laboratory Data:  High Sensitivity Troponin:   Recent Labs  Lab 10/14/21 1649 10/14/21 1834  TROPONINIHS 31* 37*     Chemistry Recent Labs  Lab 10/14/21 1649 10/15/21 0500 10/17/21 0433  NA 134* 136 133*  K 4.4 3.7 3.6  CL 98 103 92*  CO2 26 26 28   GLUCOSE 335* 152* 194*  BUN 22* 21* 34*  CREATININE 1.24 1.15 1.44*  CALCIUM 9.4 9.1 9.6  MG  --   --  1.8  GFRNONAA >60 >60 56*  ANIONGAP 10 7 13     No results for input(s): PROT, ALBUMIN, AST, ALT, ALKPHOS, BILITOT in the last 168 hours. Lipids  Recent Labs  Lab 10/15/21 0500  CHOL 195  TRIG 106  HDL 40*  LDLCALC 134*  CHOLHDL 4.9    Hematology Recent Labs  Lab 10/14/21 1649 10/15/21 0500  WBC 6.4 6.5  RBC 5.96* 5.76  HGB 16.2 15.2  HCT 51.2 48.5  MCV 85.9 84.2  MCH 27.2 26.4  MCHC 31.6 31.3  RDW 16.3* 15.6*  PLT 353 344   Thyroid  Recent Labs  Lab 10/15/21 0500  TSH 2.723    BNP Recent Labs  Lab 10/14/21 1650  BNP 100.0    DDimer No results for input(s): DDIMER in the last 168 hours.   Radiology/Studies:  DG Chest 2 View  Result Date: 10/14/2021 CLINICAL DATA:  Shortness of breath.  Congestive heart failure. EXAM: CHEST - 2 VIEW COMPARISON:  12/12/2019 FINDINGS: A pacemaker seen with leads overlying the right atrium and right ventricle. No  evidence of pneumothorax or pleural effusion. Both lungs are clear. IMPRESSION: No active cardiopulmonary disease. Electronically Signed  By: Marlaine Hind M.D.   On: 10/14/2021 17:40   ECHOCARDIOGRAM COMPLETE  Result Date: 10/15/2021    ECHOCARDIOGRAM REPORT   Patient Name:   Darryl Frye Date of Exam: 10/15/2021 Medical Rec #:  [id number]    Height:       71.0 in Accession #:    [id number]   Weight:       202.6 lb Date of Birth:  [birth year]    BSA:          2.120 m Patient Age:    28 years     BP:           113/77 mmHg Patient Gender: M            HR:           66 bpm. Exam Location:  [facility] Procedure: 2D Echo, Cardiac Doppler, Color Doppler and Intracardiac            Opacification Agent Indications:    Atrial Flutter I48.92  History:        Patient has no prior history of Echocardiogram examinations.                 CHF; Defibrillator.  Sonographer:    Merrie Roof RDCS Referring Phys: March ARB  1. Left ventricular ejection fraction, by estimation, is 20 to 25%. The left ventricle has severely decreased function. The left ventricle demonstrates global hypokinesis. The left ventricular internal cavity size was mildly dilated. Left ventricular diastolic function could not be evaluated.  2. Right ventricular systolic function is normal. The right ventricular size is normal. Tricuspid regurgitation signal is inadequate for assessing PA pressure.  3. The pericardial effusion is posterior to the left ventricle.  4. The mitral valve is normal in structure. Trivial mitral valve regurgitation. No evidence of mitral stenosis.  5. The aortic valve is normal in structure. Aortic valve regurgitation is not visualized. No aortic stenosis is present.  6. Aortic dilatation noted. There is mild dilatation of the aortic root, measuring 39 mm. FINDINGS  Left Ventricle: Left ventricular ejection fraction, by estimation, is 20 to 25%. The left ventricle has severely decreased function. The left ventricle  demonstrates global hypokinesis. The left ventricular internal cavity size was mildly dilated. There is no left ventricular hypertrophy. Left ventricular diastolic function could not be evaluated due to atrial fibrillation. Left ventricular diastolic function could not be evaluated. Right Ventricle: The right ventricular size is normal. No increase in right ventricular wall thickness. Right ventricular systolic function is normal. Tricuspid regurgitation signal is inadequate for assessing PA pressure. Left Atrium: Left atrial size was normal in size. Right Atrium: Right atrial size was normal in size. Pericardium: Trivial pericardial effusion is present. The pericardial effusion is posterior to the left ventricle. Mitral Valve: The mitral valve is normal in structure. Trivial mitral valve regurgitation. No evidence of mitral valve stenosis. Tricuspid Valve: The tricuspid valve is normal in structure. Tricuspid valve regurgitation is trivial. No evidence of tricuspid stenosis. Aortic Valve: The aortic valve is normal in structure. Aortic valve regurgitation is not visualized. No aortic stenosis is present. Aortic valve mean gradient measures 2.0 mmHg. Aortic valve peak gradient measures 3.7 mmHg. Aortic valve area, by VTI measures 2.21 cm. Pulmonic Valve: The pulmonic valve was normal in structure. Pulmonic valve regurgitation is not visualized. No evidence of pulmonic stenosis. Aorta: Aortic dilatation noted. There is mild dilatation of the aortic root, measuring 39 mm. Venous: The inferior vena cava  was not well visualized. IAS/Shunts: No atrial level shunt detected by color flow Doppler. Additional Comments: A device lead is visualized.  LEFT VENTRICLE PLAX 2D LVIDd:         5.80 cm LVIDs:         5.40 cm LV PW:         1.10 cm LV IVS:        0.90 cm LVOT diam:     2.00 cm LV SV:         36 LV SV Index:   17 LVOT Area:     3.14 cm  RIGHT VENTRICLE RV Basal diam:  3.40 cm RV S prime:     7.94 cm/s TAPSE (M-mode):  1.2 cm LEFT ATRIUM             Index        RIGHT ATRIUM           Index LA diam:        3.30 cm 1.56 cm/m   RA Area:     16.70 cm LA Vol (A2C):   51.3 ml 24.20 ml/m  RA Volume:   48.10 ml  22.69 ml/m LA Vol (A4C):   45.6 ml 21.51 ml/m LA Biplane Vol: 50.1 ml 23.63 ml/m  AORTIC VALVE AV Area (Vmax):    1.95 cm AV Area (Vmean):   1.79 cm AV Area (VTI):     2.21 cm AV Vmax:           96.50 cm/s AV Vmean:          64.600 cm/s AV VTI:            0.165 m AV Peak Grad:      3.7 mmHg AV Mean Grad:      2.0 mmHg LVOT Vmax:         60.00 cm/s LVOT Vmean:        36.800 cm/s LVOT VTI:          0.116 m LVOT/AV VTI ratio: 0.70  AORTA Ao Root diam: 3.90 cm Ao Asc diam:  3.20 cm  SHUNTS Systemic VTI:  0.12 m Systemic Diam: 2.00 cm Fransico Him MD Electronically signed by Fransico Him MD Signature Date/Time: 10/15/2021/11:47:19 AM    Final      Assessment and Plan:   1.Aflutter with RVR - from notes history of afib and aflutter - presented with aflutter with RVR - started on dilt gtt initially, issues with hypotension.  - amiodarone drip started 2/26 at 450pm (no bolus)at 60mg /hr. He is back in SR this AM. Lower amio drip to 30mg /hr, will complete 24 hr load this AM and convert to oral amio this evening. Plan would be 400mg  bid x 7 days, then 200mg  bid x 2 weeks, then 200mg  daily.  - he has been on eliquis for stroke prevention - keep K at 4 and Mg at 2 (wrote for 62mEq KCl, 2 g Mag sulfate IV today)   2. Chronic systolic HF/NICM - followed at [facility], prior cath without significant CAD - low bp's on IV cardizem originally, home HF meds held. Had been on toprol 25, entresto 24/26mg  bid, lasix 20mg  daily. Remains on jardiance 25mg  daily  - bp's improved, start back toprol. Monitor bp's.STart back home diuretic and entresto tomorrow pending renal function    Monitor HRs today, possible d/c tomorrow.    Risk Assessment/Risk Scores:  {  For questions or updates, please contact [place] Please  consult [email or web address] for  contact info under    Signed, Carlyle Dolly, MD  10/17/2021 7:58 AM

## 2021-10-18 LAB — BASIC METABOLIC PANEL
Anion gap: 11 (ref 5–15)
BUN: 30 mg/dL — ABNORMAL HIGH (ref 6–20)
CO2: 28 mmol/L (ref 22–32)
Calcium: 9.9 mg/dL (ref 8.9–10.3)
Chloride: 92 mmol/L — ABNORMAL LOW (ref 98–111)
Creatinine, Ser: 1.34 mg/dL — ABNORMAL HIGH (ref 0.61–1.24)
GFR, Estimated: >60 mL/min (ref >60)
Glucose, Bld: 196 mg/dL — ABNORMAL HIGH (ref 70–99)
Potassium: 4.6 mmol/L (ref 3.5–5.1)
Sodium: 131 mmol/L — ABNORMAL LOW (ref 135–145)

## 2021-10-18 LAB — MAGNESIUM: Magnesium: 2.2 mg/dL (ref 1.7–2.4)

## 2021-10-18 LAB — GLUCOSE, CAPILLARY: Glucose-Capillary: 219 mg/dL — ABNORMAL HIGH (ref 70–99)

## 2021-10-18 MED ORDER — FUROSEMIDE 20 MG PO TABS: 20.0000 mg | ORAL_TABLET | Freq: Every day | ORAL | Status: DC

## 2021-10-18 MED ORDER — AMIODARONE HCL 400 MG PO TABS: ORAL_TABLET | ORAL | 0 refills | Status: DC

## 2021-10-18 MED ORDER — SACUBITRIL-VALSARTAN 24-26 MG PO TABS
1.0000 | ORAL_TABLET | Freq: Two times a day (BID) | ORAL | Status: DC
  Administered 2021-10-18: 1 via ORAL
  Filled 2021-10-18: qty 1

## 2021-10-18 NOTE — Progress Notes (Signed)
Reviewed appt request with scheduling team - they will look ahead to make some adjustments to the schedule and will call the patient with appt information.

## 2021-10-18 NOTE — Progress Notes (Addendum)
Progress Note  Patient Name: Darryl Frye Date of Encounter: 10/18/2021  [facility] Cardiologist: Followed at [facility], wants to establish with [facility]  Subjective   No complaints  Inpatient Medications    Scheduled Meds:  amiodarone  400 mg Oral BID   apixaban  5 mg Oral BID   atorvastatin  10 mg Oral Daily   Chlorhexidine Gluconate Cloth  6 each Topical Q0600   empagliflozin  25 mg Oral Daily   glipiZIDE  5 mg Oral QAC breakfast   insulin aspart  0-15 Units Subcutaneous TID WC   insulin aspart  0-5 Units Subcutaneous QHS   metFORMIN  500 mg Oral BID WC   methocarbamol  500 mg Oral BID   metoprolol succinate  25 mg Oral Daily   mupirocin ointment  1 application Nasal BID   sertraline  50 mg Oral Daily   Continuous Infusions:  PRN Meds: acetaminophen, metoprolol tartrate, ondansetron (ZOFRAN) IV   Vital Signs    Vitals:   10/18/21 0400 10/18/21 0402 10/18/21 0500 10/18/21 0600  BP: 135/89  110/71 109/77  Pulse: 67  65 63  Resp: 13  18 12   Temp:  97.9 F (36.6 C)    TempSrc:  Oral    SpO2: 96%  97% 94%  Weight:  89.8 kg    Height:        Intake/Output Summary (Last 24 hours) at 10/18/2021 0745 Last data filed at 10/18/2021 0402 Gross per 24 hour  Intake 704.81 ml  Output 3150 ml  Net -2445.19 ml   Last 3 Weights 10/18/2021 10/17/2021 10/16/2021  Weight (lbs) 197 lb 15.6 oz 195 lb 15.8 oz 198 lb 10.2 oz  Weight (kg) 89.8 kg 88.9 kg 90.1 kg      Telemetry    SR - Personally Reviewed  ECG    N/a - Personally Reviewed  Physical Exam   GEN: No acute distress.   Neck: No JVD Cardiac: RRR, no murmurs, rubs, or gallops.  Respiratory: Clear to auscultation bilaterally. GI: Soft, nontender, non-distended  MS: No edema; No deformity. Neuro:  Nonfocal  Psych: Normal affect   Labs    High Sensitivity Troponin:   Recent Labs  Lab 10/14/21 1649 10/14/21 1834  TROPONINIHS 31* 37*     Chemistry Recent Labs  Lab 10/15/21 0500 10/17/21 0433  10/18/21 0420  NA 136 133* 131*  K 3.7 3.6 4.6  CL 103 92* 92*  CO2 26 28 28   GLUCOSE 152* 194* 196*  BUN 21* 34* 30*  CREATININE 1.15 1.44* 1.34*  CALCIUM 9.1 9.6 9.9  MG  --  1.8 2.2  GFRNONAA >60 56* >60  ANIONGAP 7 13 11     Lipids  Recent Labs  Lab 10/15/21 0500  CHOL 195  TRIG 106  HDL 40*  LDLCALC 134*  CHOLHDL 4.9    Hematology Recent Labs  Lab 10/14/21 1649 10/15/21 0500  WBC 6.4 6.5  RBC 5.96* 5.76  HGB 16.2 15.2  HCT 51.2 48.5  MCV 85.9 84.2  MCH 27.2 26.4  MCHC 31.6 31.3  RDW 16.3* 15.6*  PLT 353 344   Thyroid  Recent Labs  Lab 10/15/21 0500  TSH 2.723    BNP Recent Labs  Lab 10/14/21 1650  BNP 100.0    DDimer No results for input(s): DDIMER in the last 168 hours.   Radiology    No results found.  Cardiac Studies   Admit labs K 4.4 BUN 22 Cr 1.24 WBC 6.4 Hgb  16.2 Plt 353 BNP 100 TSH 2.7  Trop 31-->37 COVID neg Flu neg CXR no acute process EKG typical aflutter with RVR 09/2021 echo: LVEF 20-25%     07/2019 [facility] Cath: normal coronaries 03/2021 echo [facility] : LVEF 30%, mild RV dysfunction  Patient Profile     Darryl Frye is a 61 y.o. male with a hx of chronic systolic HF/NICM, afib/aflutter  who is being seen 10/17/2021 for the evaluation of tachycardia at the request of Dr Manuella Ghazi.  Assessment & Plan    1.Aflutter with RVR - from notes history of afib and aflutter - presented with aflutter with RVR - started on dilt gtt initially, issues with hypotension.  - started on IV amiodarone, converted back to SR. Yesterday evening converted to oral amio 400mg  bid.  - Plan would be 400mg  bid x 7 days, then 200mg  bid x 2 weeks, then 200mg  daily.  - he has been on eliquis for stroke prevention      2. Chronic systolic HF/NICM - followed at [facility], prior cath without significant CAD - low bp's on IV cardizem originally, home HF meds held. Had been on toprol 25, entresto 24/26mg  bid, lasix 20mg  daily. Remains on jardiance 25mg  daily    - restarted toprol yesterday. AKI resolving, restart entersto 24/26mg  bid. Would hold oral diuretic until tomorrow.       Patient would like to establish cardiology care with [place] here in [place], will arrange outpatient appt. At f/u will need to also have him establish in device clinic. We will sign off inpatient care.     For questions or updates, please contact [place] Please consult [email or web address] for contact info under        Signed, Carlyle Dolly, MD  10/18/2021, 7:45 AM

## 2021-10-18 NOTE — Discharge Summary (Signed)
Physician Discharge Summary  Darryl Frye TOI:[id number] DOB: August 25, 1960 DOA: 10/14/2021  PCP: [name], FNP  Admit date: 10/14/2021  Discharge date: 10/18/2021  Admitted From:Home  Disposition:  Home  Recommendations for Outpatient Follow-up:  Follow up with PCP in 1-2 weeks Follow-up with cardiology which will be scheduled outpatient Continue on amiodarone as prescribed Continue on other home medications as prior  Home Health: None  Equipment/Devices: None  Discharge Condition:Stable  CODE STATUS: Full  Diet recommendation: Heart Healthy/carb modified  Brief/Interim Summary: Per HPI: Darryl Frye is a 61 y.o. male with medical history significant of heart failure with reduced EF of 30% 03/2021, A-fib/a flutter on anticoagulation with dual-chamber pacer and AICD.  He also has diabetes, hypertension.  Patient presents with shortness of breath, palpitations, dizziness that has been worsening over the past 2 to 3 days that is worse with exertion and when lying down flat.  These are improved when sitting upright or at rest.  The palpitations have been getting worse.  Denies chest pains, abdominal pains, fevers, chills, nausea, vomiting.  On presentation to the emergency department, the patient was found to be in atrial flutter with RVR.  Cardiology was consulted who felt the patient should be rate controlled and can be managed here.  Troponins were stable x2.   10/15/21: Patient admitted for atrial fibrillation/flutter with RVR and was initially started on Cardizem drip which was now been weaned off.  He continues to have good heart rate control at rest, but with exertion becomes quite symptomatic and has elevated heart rates.  Discussed case with cardiology Dr. Domenic Polite recommends cardioversion on 2/27 for better rate control.   10/16/21: Patient has been restarted on Cardizem drip due to elevated heart rates and is starting to have some mild hypotension noted.  Diuretics and  Delene Loll will be discontinued for now.  Anticipating cardioversion in a.m.   10/17/21: Patient was started on amiodarone 2/26 and has converted to sinus rhythm this AM.  No further need for cardioversion at this time.  He will be converted to oral amiodarone later this evening with anticipated discharge in a.m.   10/18/2021: Patient is stable with stable heart rate control noted by cardiology this AM.  He has been transitioned to oral amiodarone with taper as prescribed.  He is in stable condition for discharge and will have outpatient follow-up with cardiology.  No other acute events noted during the course of this admission.  Discharge Diagnoses:  Principal Problem:   Atrial fibrillation with RVR (HCC) Active Problems:   Chronic systolic CHF (congestive heart failure) (HCC)   DM (diabetes mellitus), type 2 ([place])   Essential hypertension   ICD (implantable cardioverter-defibrillator) discharge   Uncontrolled type 2 diabetes mellitus with hyperglycemia, without Bruhl-term current use of insulin ([place])  Principal discharge diagnosis: Atrial flutter with RVR.  Chronic systolic heart failure/NICM.  Discharge Instructions  Discharge Instructions     Diet - low sodium heart healthy   Complete by: As directed    Increase activity slowly   Complete by: As directed       Allergies as of 10/18/2021   No Known Allergies      Medication List     TAKE these medications    amiodarone 400 MG tablet Commonly known as: PACERONE Take 1 tablet (400 mg total) by mouth 2 (two) times daily for 7 days, THEN 0.5 tablets (200 mg total) 2 (two) times daily for 14 days, THEN 0.5 tablets (200 mg total) daily. Start taking on:  October 18, 2021   apixaban 5 MG Tabs tablet Commonly known as: ELIQUIS Take 5 mg by mouth 2 (two) times daily.   atorvastatin 10 MG tablet Commonly known as: LIPITOR Take 10 mg by mouth daily.   empagliflozin 25 MG Tabs tablet Commonly known as: JARDIANCE Take 1 tablet  by mouth daily.   Entresto 24-26 MG Generic drug: sacubitril-valsartan Take 1 tablet by mouth 2 (two) times daily.   furosemide 20 MG tablet Commonly known as: LASIX Take 20 mg by mouth daily.   glipiZIDE 5 MG tablet Commonly known as: GLUCOTROL Take 5 mg by mouth daily before breakfast.   insulin glargine 100 UNIT/ML Solostar Pen Commonly known as: LANTUS Inject 32 Units into the skin daily.   magnesium oxide 400 MG tablet Commonly known as: MAG-OX Take 400 mg by mouth 2 (two) times daily as needed.   metFORMIN 1000 MG tablet Commonly known as: GLUCOPHAGE Take 1,000 mg by mouth 2 (two) times daily with a meal.   methocarbamol 500 MG tablet Commonly known as: ROBAXIN Take 1 tablet (500 mg total) by mouth 2 (two) times daily.   metoprolol succinate 25 MG 24 hr tablet Commonly known as: TOPROL-XL Take by mouth.   OVER THE COUNTER MEDICATION Take 2 tablets by mouth daily. Detox tablet   sertraline 50 MG tablet Commonly known as: ZOLOFT Take 50 mg by mouth daily.   sertraline 50 MG tablet Commonly known as: ZOLOFT Take 1 tablet by mouth daily.   Vitamin D-3 25 MCG (1000 UT) Caps Take 1 capsule by mouth daily.        Follow-up Information     [facility] Follow up.   Specialty: Cardiology Why: [facility] office will call you schedule your follow-up appointment. Contact information: [address] [place], [place], [place]. Schedule an appointment as soon as possible for a visit.   Specialty: Family Medicine Contact information: [place] [address] [phone]                No Known Allergies  Consultations: Cardiology   Procedures/Studies: DG Chest 2 View  Result Date: 10/14/2021 CLINICAL DATA:  Shortness of breath.  Congestive heart failure. EXAM: CHEST - 2 VIEW COMPARISON:  12/12/2019 FINDINGS: A pacemaker seen with leads overlying the  right atrium and right ventricle. No evidence of pneumothorax or pleural effusion. Both lungs are clear. IMPRESSION: No active cardiopulmonary disease. Electronically Signed   By: Marlaine Hind M.D.   On: 10/14/2021 17:40   ECHOCARDIOGRAM COMPLETE  Result Date: 10/15/2021    ECHOCARDIOGRAM REPORT   Patient Name:   Darryl Frye Date of Exam: 10/15/2021 Medical Rec #:  [id number]    Height:       71.0 in Accession #:    [id number]   Weight:       202.6 lb Date of Birth:  [birth year]    BSA:          2.120 m Patient Age:    21 years     BP:           113/77 mmHg Patient Gender: M            HR:           66 bpm. Exam Location:  [facility] Procedure: 2D Echo, Cardiac Doppler, Color Doppler and Intracardiac  Opacification Agent Indications:    Atrial Flutter I48.92  History:        Patient has no prior history of Echocardiogram examinations.                 CHF; Defibrillator.  Sonographer:    Merrie Roof RDCS Referring Phys: [place]  1. Left ventricular ejection fraction, by estimation, is 20 to 25%. The left ventricle has severely decreased function. The left ventricle demonstrates global hypokinesis. The left ventricular internal cavity size was mildly dilated. Left ventricular diastolic function could not be evaluated.  2. Right ventricular systolic function is normal. The right ventricular size is normal. Tricuspid regurgitation signal is inadequate for assessing PA pressure.  3. The pericardial effusion is posterior to the left ventricle.  4. The mitral valve is normal in structure. Trivial mitral valve regurgitation. No evidence of mitral stenosis.  5. The aortic valve is normal in structure. Aortic valve regurgitation is not visualized. No aortic stenosis is present.  6. Aortic dilatation noted. There is mild dilatation of the aortic root, measuring 39 mm. FINDINGS  Left Ventricle: Left ventricular ejection fraction, by estimation, is 20 to 25%. The left ventricle has severely  decreased function. The left ventricle demonstrates global hypokinesis. The left ventricular internal cavity size was mildly dilated. There is no left ventricular hypertrophy. Left ventricular diastolic function could not be evaluated due to atrial fibrillation. Left ventricular diastolic function could not be evaluated. Right Ventricle: The right ventricular size is normal. No increase in right ventricular wall thickness. Right ventricular systolic function is normal. Tricuspid regurgitation signal is inadequate for assessing PA pressure. Left Atrium: Left atrial size was normal in size. Right Atrium: Right atrial size was normal in size. Pericardium: Trivial pericardial effusion is present. The pericardial effusion is posterior to the left ventricle. Mitral Valve: The mitral valve is normal in structure. Trivial mitral valve regurgitation. No evidence of mitral valve stenosis. Tricuspid Valve: The tricuspid valve is normal in structure. Tricuspid valve regurgitation is trivial. No evidence of tricuspid stenosis. Aortic Valve: The aortic valve is normal in structure. Aortic valve regurgitation is not visualized. No aortic stenosis is present. Aortic valve mean gradient measures 2.0 mmHg. Aortic valve peak gradient measures 3.7 mmHg. Aortic valve area, by VTI measures 2.21 cm. Pulmonic Valve: The pulmonic valve was normal in structure. Pulmonic valve regurgitation is not visualized. No evidence of pulmonic stenosis. Aorta: Aortic dilatation noted. There is mild dilatation of the aortic root, measuring 39 mm. Venous: The inferior vena cava was not well visualized. IAS/Shunts: No atrial level shunt detected by color flow Doppler. Additional Comments: A device lead is visualized.  LEFT VENTRICLE PLAX 2D LVIDd:         5.80 cm LVIDs:         5.40 cm LV PW:         1.10 cm LV IVS:        0.90 cm LVOT diam:     2.00 cm LV SV:         36 LV SV Index:   17 LVOT Area:     3.14 cm  RIGHT VENTRICLE RV Basal diam:  3.40 cm RV  S prime:     7.94 cm/s TAPSE (M-mode): 1.2 cm LEFT ATRIUM             Index        RIGHT ATRIUM           Index LA diam:  3.30 cm 1.56 cm/m   RA Area:     16.70 cm LA Vol (A2C):   51.3 ml 24.20 ml/m  RA Volume:   48.10 ml  22.69 ml/m LA Vol (A4C):   45.6 ml 21.51 ml/m LA Biplane Vol: 50.1 ml 23.63 ml/m  AORTIC VALVE AV Area (Vmax):    1.95 cm AV Area (Vmean):   1.79 cm AV Area (VTI):     2.21 cm AV Vmax:           96.50 cm/s AV Vmean:          64.600 cm/s AV VTI:            0.165 m AV Peak Grad:      3.7 mmHg AV Mean Grad:      2.0 mmHg LVOT Vmax:         60.00 cm/s LVOT Vmean:        36.800 cm/s LVOT VTI:          0.116 m LVOT/AV VTI ratio: 0.70  AORTA Ao Root diam: 3.90 cm Ao Asc diam:  3.20 cm  SHUNTS Systemic VTI:  0.12 m Systemic Diam: 2.00 cm Fransico Him MD Electronically signed by Fransico Him MD Signature Date/Time: 10/15/2021/11:47:19 AM    Final      Discharge Exam: Vitals:   10/18/21 0800 10/18/21 0849  BP:  108/81  Pulse: 81 71  Resp: 17 17  Temp: (!) 96.7 F (35.9 C)   SpO2: 96% 96%   Vitals:   10/18/21 0600 10/18/21 0700 10/18/21 0800 10/18/21 0849  BP: 109/77 110/65  108/81  Pulse: 63 65 81 71  Resp: 12 14 17 17   Temp:   (!) 96.7 F (35.9 C)   TempSrc:   Axillary   SpO2: 94% 97% 96% 96%  Weight:      Height:        General: Pt is alert, awake, not in acute distress Cardiovascular: RRR, S1/S2 +, no rubs, no gallops Respiratory: CTA bilaterally, no wheezing, no rhonchi Abdominal: Soft, NT, ND, bowel sounds + Extremities: no edema, no cyanosis    The results of significant diagnostics from this hospitalization (including imaging, microbiology, ancillary and laboratory) are listed below for reference.     Microbiology: Recent Results (from the past 240 hour(s))  Resp Panel by RT-PCR (Flu A&B, Covid) Nasopharyngeal Swab     Status: None   Collection Time: 10/14/21  9:44 PM   Specimen: Nasopharyngeal Swab; Nasopharyngeal(NP) swabs in vial transport  medium  Result Value Ref Range Status   SARS Coronavirus 2 by RT PCR NEGATIVE NEGATIVE Final    Comment: (NOTE) SARS-CoV-2 target nucleic acids are NOT DETECTED.  The SARS-CoV-2 RNA is generally detectable in upper respiratory specimens during the acute phase of infection. The lowest concentration of SARS-CoV-2 viral copies this assay can detect is 138 copies/mL. A negative result does not preclude SARS-Cov-2 infection and should not be used as the sole basis for treatment or other patient management decisions. A negative result may occur with  improper specimen collection/handling, submission of specimen other than nasopharyngeal swab, presence of viral mutation(s) within the areas targeted by this assay, and inadequate number of viral copies(<138 copies/mL). A negative result must be combined with clinical observations, patient history, and epidemiological information. The expected result is Negative.  Fact Sheet for Patients:  EntrepreneurPulse.com.au  Fact Sheet for Healthcare Providers:  IncredibleEmployment.be  This test is no t yet approved or cleared by the [place] and  has been authorized for detection and/or diagnosis of SARS-CoV-2 by FDA under an Emergency Use Authorization (EUA). This EUA will remain  in effect (meaning this test can be used) for the duration of the COVID-19 declaration under Section 564(b)(1) of the Act, 21 U.S.C.section 360bbb-3(b)(1), unless the authorization is terminated  or revoked sooner.       Influenza A by PCR NEGATIVE NEGATIVE Final   Influenza B by PCR NEGATIVE NEGATIVE Final    Comment: (NOTE) The Xpert Xpress SARS-CoV-2/FLU/RSV plus assay is intended as an aid in the diagnosis of influenza from Nasopharyngeal swab specimens and should not be used as a sole basis for treatment. Nasal washings and aspirates are unacceptable for Xpert Xpress SARS-CoV-2/FLU/RSV testing.  Fact Sheet for  Patients: EntrepreneurPulse.com.au  Fact Sheet for Healthcare Providers: IncredibleEmployment.be  This test is not yet approved or cleared by the [place] FDA and has been authorized for detection and/or diagnosis of SARS-CoV-2 by FDA under an Emergency Use Authorization (EUA). This EUA will remain in effect (meaning this test can be used) for the duration of the COVID-19 declaration under Section 564(b)(1) of the Act, 21 U.S.C. section 360bbb-3(b)(1), unless the authorization is terminated or revoked.  Performed at [facility], [address], [place], [place] [address]   MRSA Next Gen by PCR, Nasal     Status: Abnormal   Collection Time: 10/14/21 10:59 PM   Specimen: Nasal Mucosa; Nasal Swab  Result Value Ref Range Status   MRSA by PCR Next Gen DETECTED (A) NOT DETECTED Final    Comment: RESULT CALLED TO, READ BACK BY AND VERIFIED WITH: HYLTON,L @ 5329 ON 10/15/21 BY JUW (NOTE) The GeneXpert MRSA Assay (FDA approved for NASAL specimens only), is one component of a comprehensive MRSA colonization surveillance program. It is not intended to diagnose MRSA infection nor to guide or monitor treatment for MRSA infections. Test performance is not FDA approved in patients less than 14 years old. Performed at [facility], [address] [place], [place] [address]      Labs: BNP (last 3 results) Recent Labs    10/14/21 1650  BNP 834.1   Basic Metabolic Panel: Recent Labs  Lab 10/14/21 1649 10/15/21 0500 10/17/21 0433 10/18/21 0420  NA 134* 136 133* 131*  K 4.4 3.7 3.6 4.6  CL 98 103 92* 92*  CO2 26 26 28 28   GLUCOSE 335* 152* 194* 196*  BUN 22* 21* 34* 30*  CREATININE 1.24 1.15 1.44* 1.34*  CALCIUM 9.4 9.1 9.6 9.9  MG  --   --  1.8 2.2   Liver Function Tests: No results for input(s): AST, ALT, ALKPHOS, BILITOT, PROT, ALBUMIN in the last 168 hours. No results for input(s): LIPASE, AMYLASE in the last 168 hours. No results for  input(s): AMMONIA in the last 168 hours. CBC: Recent Labs  Lab 10/14/21 1649 10/15/21 0500  WBC 6.4 6.5  HGB 16.2 15.2  HCT 51.2 48.5  MCV 85.9 84.2  PLT 353 344   Cardiac Enzymes: No results for input(s): CKTOTAL, CKMB, CKMBINDEX, TROPONINI in the last 168 hours. BNP: Invalid input(s): POCBNP CBG: Recent Labs  Lab 10/17/21 0721 10/17/21 1102 10/17/21 1624 10/17/21 1942 10/18/21 0743  GLUCAP 268* 214* 122* 178* 219*   D-Dimer No results for input(s): DDIMER in the last 72 hours. Hgb A1c No results for input(s): HGBA1C in the last 72 hours. Lipid Profile No results for input(s): CHOL, HDL, LDLCALC, TRIG, CHOLHDL, LDLDIRECT in the last 72 hours. Thyroid function studies  No results for input(s): TSH, T4TOTAL, T3FREE, THYROIDAB in the last 72 hours.  Invalid input(s): FREET3 Anemia work up No results for input(s): VITAMINB12, FOLATE, FERRITIN, TIBC, IRON, RETICCTPCT in the last 72 hours. Urinalysis    Component Value Date/Time   COLORURINE STRAW (A) 04/21/2020 1654   APPEARANCEUR CLEAR 04/21/2020 1654   LABSPEC 1.021 04/21/2020 1654   PHURINE 6.0 04/21/2020 1654   GLUCOSEU >=500 (A) 04/21/2020 1654   HGBUR SMALL (A) 04/21/2020 1654   BILIRUBINUR NEGATIVE 04/21/2020 1654   KETONESUR NEGATIVE 04/21/2020 1654   PROTEINUR NEGATIVE 04/21/2020 1654   NITRITE NEGATIVE 04/21/2020 1654   LEUKOCYTESUR NEGATIVE 04/21/2020 1654   Sepsis Labs Invalid input(s): PROCALCITONIN,  WBC,  LACTICIDVEN Microbiology Recent Results (from the past 240 hour(s))  Resp Panel by RT-PCR (Flu A&B, Covid) Nasopharyngeal Swab     Status: None   Collection Time: 10/14/21  9:44 PM   Specimen: Nasopharyngeal Swab; Nasopharyngeal(NP) swabs in vial transport medium  Result Value Ref Range Status   SARS Coronavirus 2 by RT PCR NEGATIVE NEGATIVE Final    Comment: (NOTE) SARS-CoV-2 target nucleic acids are NOT DETECTED.  The SARS-CoV-2 RNA is generally detectable in upper respiratory specimens  during the acute phase of infection. The lowest concentration of SARS-CoV-2 viral copies this assay can detect is 138 copies/mL. A negative result does not preclude SARS-Cov-2 infection and should not be used as the sole basis for treatment or other patient management decisions. A negative result may occur with  improper specimen collection/handling, submission of specimen other than nasopharyngeal swab, presence of viral mutation(s) within the areas targeted by this assay, and inadequate number of viral copies(<138 copies/mL). A negative result must be combined with clinical observations, patient history, and epidemiological information. The expected result is Negative.  Fact Sheet for Patients:  EntrepreneurPulse.com.au  Fact Sheet for Healthcare Providers:  IncredibleEmployment.be  This test is no t yet approved or cleared by the [place] FDA and  has been authorized for detection and/or diagnosis of SARS-CoV-2 by FDA under an Emergency Use Authorization (EUA). This EUA will remain  in effect (meaning this test can be used) for the duration of the COVID-19 declaration under Section 564(b)(1) of the Act, 21 U.S.C.section 360bbb-3(b)(1), unless the authorization is terminated  or revoked sooner.       Influenza A by PCR NEGATIVE NEGATIVE Final   Influenza B by PCR NEGATIVE NEGATIVE Final    Comment: (NOTE) The Xpert Xpress SARS-CoV-2/FLU/RSV plus assay is intended as an aid in the diagnosis of influenza from Nasopharyngeal swab specimens and should not be used as a sole basis for treatment. Nasal washings and aspirates are unacceptable for Xpert Xpress SARS-CoV-2/FLU/RSV testing.  Fact Sheet for Patients: EntrepreneurPulse.com.au  Fact Sheet for Healthcare Providers: IncredibleEmployment.be  This test is not yet approved or cleared by the [place] FDA and has been authorized for detection  and/or diagnosis of SARS-CoV-2 by FDA under an Emergency Use Authorization (EUA). This EUA will remain in effect (meaning this test can be used) for the duration of the COVID-19 declaration under Section 564(b)(1) of the Act, 21 U.S.C. section 360bbb-3(b)(1), unless the authorization is terminated or revoked.  Performed at [facility], [address], [place], [place] [address]   MRSA Next Gen by PCR, Nasal     Status: Abnormal   Collection Time: 10/14/21 10:59 PM   Specimen: Nasal Mucosa; Nasal Swab  Result Value Ref Range Status   MRSA by PCR Next Gen DETECTED (A) NOT DETECTED Final  Comment: RESULT CALLED TO, READ BACK BY AND VERIFIED WITH: HYLTON,L @ 1819 ON 10/15/21 BY JUW (NOTE) The GeneXpert MRSA Assay (FDA approved for NASAL specimens only), is one component of a comprehensive MRSA colonization surveillance program. It is not intended to diagnose MRSA infection nor to guide or monitor treatment for MRSA infections. Test performance is not FDA approved in patients less than 56 years old. Performed at [facility], [address] [place], [place] [address]      Time coordinating discharge: 35 minutes  SIGNED:   Rodena Goldmann, DO [facility] 10/18/2021, 9:10 AM  If 7PM-7AM, please contact night-coverage [email or web address]

## 2021-10-23 NOTE — Progress Notes (Signed)
Referring Provider: [name], FNP Primary Care Physician:  [name], [place] Primary Gastroenterologist:  Dr. Abbey Chatters  Chief Complaint  Patient presents with   Colonoscopy    HPI:   Darryl Frye is a 61 y.o. male presenting today at the request of [name], [place] for consult colonoscopy.    Medical history significant of heart failure with reduced EF of 20-25% 10/15/21, A-fib/a flutter on anticoagulation with dual-chamber pacer and AICD.  He also has diabetes, hypertension.   Reviewed referral information.  Office visit with PCP in June 2022.  His chronic medical conditions are stable.  He reported his last colonoscopy was over 5 years ago in [place] with many polyps.  Noted right inguinal hernia.  He was referred to general surgery for hernia repair, referred to GI to discuss scheduling a colonoscopy.  Routine labs were also ordered, but not included in this referral.  Admitted in the latter part of February 2023 at [facility] with A fib with RVR. Treated with Cardizem and started on amiodarone, converted to sinus rhythm. Scheduled for follow-up with cardiology on 3/14.   Will need OV in a couple of months to allow time for follow-up with cardiology outpatient.   Today:  Last TCS was at age 79. Had polyps. No Fhx of colon cancer or colon polyps. No GI concerns for me today.  Denies abdominal pain, constipation, diarrhea, bright red blood per rectum, melena, unintentional weight loss.  Admits to occasional heartburn if eating spicy foods and occasionally fried foods. Stopped coffee and heartburn has improved overall.  Symptoms once a week or less.  Denies nausea, vomiting, dysphagia.  No CP, palpitations, SOB or cough.  Has been feeling very well since hospital discharge.  Past Medical History:  Diagnosis Date   Acute kidney injury ([place])    Acute respiratory failure ([place])    Atrial flutter ([place])    on Eliquis   CHF (congestive heart failure) ([place])     EF 20-25% 10/15/21, AICD in place.   Coronary artery disease    Diabetes mellitus without complication (HCC)    DVT (deep venous thrombosis) ([place]) 06/2019   Heart murmur    Hypertension    Presence of permanent cardiac pacemaker    Renal disorder     Past Surgical History:  Procedure Laterality Date   [place].   CYST REMOVAL NECK     HERNIA REPAIR     TONSILLECTOMY      Current Outpatient Medications  Medication Sig Dispense Refill   amiodarone (PACERONE) 400 MG tablet Take 1 tablet (400 mg total) by mouth 2 (two) times daily for 7 days, THEN 0.5 tablets (200 mg total) 2 (two) times daily for 14 days, THEN 0.5 tablets (200 mg total) daily. 43 tablet 0   apixaban (ELIQUIS) 5 MG TABS tablet Take 5 mg by mouth 2 (two) times daily.     atorvastatin (LIPITOR) 10 MG tablet Take 10 mg by mouth daily.     Cholecalciferol (VITAMIN D-3) 25 MCG (1000 UT) CAPS Take 1 capsule by mouth daily.     empagliflozin (JARDIANCE) 25 MG TABS tablet Take 1 tablet by mouth daily.     furosemide (LASIX) 20 MG tablet Take 20 mg by mouth daily.     glipiZIDE (GLUCOTROL) 5 MG tablet Take 5 mg by mouth daily before breakfast.     insulin glargine (LANTUS) 100 UNIT/ML Solostar Pen Inject 32 Units into  the skin daily.     magnesium oxide (MAG-OX) 400 MG tablet Take 400 mg by mouth 2 (two) times daily as needed.     metFORMIN (GLUCOPHAGE) 1000 MG tablet Take 1,000 mg by mouth 2 (two) times daily with a meal.     methocarbamol (ROBAXIN) 500 MG tablet Take 1 tablet (500 mg total) by mouth 2 (two) times daily. 20 tablet 0   metoprolol succinate (TOPROL-XL) 25 MG 24 hr tablet Take by mouth.     OVER THE COUNTER MEDICATION Take 2 tablets by mouth daily. Detox tablet     sacubitril-valsartan (ENTRESTO) 24-26 MG Take 1 tablet by mouth 2 (two) times daily.     No current facility-administered medications for this visit.    Allergies as of 10/24/2021   (No Known  Allergies)    Family History  Problem Relation Age of Onset   Colon cancer Neg Hx    Colon polyps Neg Hx     Social History   Socioeconomic History   Marital status: Divorced    Spouse name: Not on file   Number of children: Not on file   Years of education: Not on file   Highest education level: Not on file  Occupational History   Not on file  Tobacco Use   Smoking status: Former    Packs/day: 1.00    Years: 20.00    Pack years: 20.00    Types: Cigarettes    Quit date: 07/07/2019    Years since quitting: 2.3   Smokeless tobacco: Never  Substance and Sexual Activity   Alcohol use: Not Currently   Drug use: Not Currently    Types: "Crack" cocaine    Comment: last used over 2 years ago (10/24/21).   Sexual activity: Not on file  Other Topics Concern   Not on file  Social History Narrative   Not on file   Social Determinants of Health   Financial Resource Strain: Not on file  Food Insecurity: Not on file  Transportation Needs: Not on file  Physical Activity: Not on file  Stress: Not on file  Social Connections: Not on file  Intimate Partner Violence: Not on file    Review of Systems: Gen: Denies any fever, chills, cold or flulike symptoms, presyncope, syncope. CV: Denies chest pain, heart palpitations. Resp: Denies shortness of breath or cough. GI: HPI GU : Denies urinary burning, urinary frequency, urinary hesitancy MS: Denies joint pain. Derm: Denies rash. Psych: Denies depression, anxiety. Heme: See HPI  Physical Exam: BP 110/68    Pulse 66    Temp (!) 97.2 F (36.2 C) (Temporal)    Ht 5\' 11"  (1.803 m)    Wt 210 lb 9.6 oz (95.5 kg)    BMI 29.37 kg/m  General:   Alert and oriented. Pleasant and cooperative. Well-nourished and well-developed.  Walking with a cane. Head:  Normocephalic and atraumatic. Eyes:  Without icterus, sclera clear and conjunctiva pink.  Ears:  Normal auditory acuity. Lungs:  Clear to auscultation bilaterally. No wheezes, rales,  or rhonchi. No distress.  Heart:  S1, S2 present without murmurs appreciated.  Abdomen:  +BS, soft, non-tender and non-distended. No HSM noted. No guarding or rebound. No masses appreciated.  Rectal:  Deferred  Msk:  Symmetrical without gross deformities. Normal posture. Extremities:  Without edema. Neurologic:  Alert and  oriented x4;  grossly normal neurologically. Skin:  Intact without significant lesions or rashes. Psych:  Normal mood and affect.    Assessment: 61 year old male  with history of diabetes, HTN, heart failure with reduced EF of 20-25% in February 2023, A-fib/a flutter on Eliquis, dual-chamber pacer and AICD in place, presenting today to discuss scheduling colonoscopy.  He reports his last colonoscopy was at age 50 in [place], [place] with colon polyps.  No family history of colon cancer or colon polyps.  No significant lower GI symptoms.    Due to significantly reduced EF, I will need to discuss his case with anesthesia to see if he is a candidate for colonoscopy locally or if he will need to be referred to [facility].  Additionally, he was recently admitted to the hospital in February with A-fib with RVR, started on amiodarone and converted to sinus rhythm.  Clinically, he is doing very well.  He will need follow-up with cardiology with clearance for colonoscopy prior to scheduling, currently scheduled to see Dr. Harl Bowie on 3/14.  We also discussed his history of heartburn.  Symptoms are much improved after he discontinued coffee.  Now only with occasional symptoms if eating spicy or fried foods, but this is not frequent.  Reinforced GERD diet/lifestyle.  Requested let us know of any worsening symptoms.   Plan: I will discuss his case with anesthesia staff to determine candidacy of colonoscopy at [facility] versus need for referral to [facility]. If referral is needed, I will reach out to Dr. Mansouraty/Dr. Ardis Hughs to see if they would be willing to see him.  If  colonoscopy is pursued at [facility], we will need to obtain cardiac clearance and get the okay to hold Eliquis for 48 hours prior to procedure. He will also need a UDS at preop due to history of cocaine use. Counseled on GERD diet/lifestyle.  Separate written instructions provided.   Aliene Altes, PA-C [facility] 10/24/2021

## 2021-10-24 ENCOUNTER — Encounter: Payer: Self-pay | Admitting: Gastroenterology

## 2021-10-24 ENCOUNTER — Other Ambulatory Visit: Payer: Self-pay

## 2021-10-24 ENCOUNTER — Ambulatory Visit: Payer: Medicaid Other | Admitting: Gastroenterology

## 2021-10-24 VITALS — BP 110/68 | HR 66 | Temp 97.2°F | Ht 71.0 in | Wt 210.6 lb

## 2021-10-24 DIAGNOSIS — I5020 Unspecified systolic (congestive) heart failure: Secondary | ICD-10-CM | POA: Diagnosis not present

## 2021-10-24 DIAGNOSIS — Z87898 Personal history of other specified conditions: Secondary | ICD-10-CM | POA: Diagnosis not present

## 2021-10-24 DIAGNOSIS — R12 Heartburn: Secondary | ICD-10-CM | POA: Diagnosis not present

## 2021-10-24 DIAGNOSIS — Z86010 Personal history of colonic polyps: Secondary | ICD-10-CM

## 2021-10-24 NOTE — Patient Instructions (Addendum)
I would discuss the possibility of scheduling a colonoscopy at [facility] with our anesthesia staff.  You should hear back from our office by the end of the week.  If not, please reach back out. ? ?As we discussed, if you are not a candidate to have a colonoscopy at [facility], I will reach out to the GI doctors in [place]. ? ?To help with your occasional heartburn symptoms:  ?Avoid fried, fatty, greasy, spicy, citrus foods. ?Avoid caffeine and carbonated beverages. ?Avoid chocolate. ?Try eating 4-6 small meals a day rather than 3 large meals. ?Do not eat within 3 hours of laying down. ?Prop head of bed up on wood or bricks to create a 6 inch incline. ? ?Please let me know of any worsening heartburn symptoms and we can consider starting you on a medication for this. ? ?It was a pleasure meeting you today!  I am glad you are feeling much better! ? ?Darryl Altes, PA-C ?[facility] ? ? ? ?

## 2021-10-31 ENCOUNTER — Telehealth: Payer: Self-pay | Admitting: Internal Medicine

## 2021-10-31 DIAGNOSIS — Z86010 Personal history of colonic polyps: Secondary | ICD-10-CM

## 2021-10-31 NOTE — Telephone Encounter (Signed)
I tried reaching out to anesthesia last week. Left message with CRNA who was going to talk with the anesthesia staff and call me back. Unfortunately, I have not heard back. I will try reaching them again today or tomorrow.  ? ?I appreciate him being proactive about his health!  ?

## 2021-10-31 NOTE — Telephone Encounter (Signed)
Spoke to pt, informed him that I would ask provider and get back with him  ?

## 2021-10-31 NOTE — Telephone Encounter (Signed)
Pt called asking about getting scheduled his colonoscopy and weather it would be here or in [place] since he has a pacemaker. Please advise. [phone] ?

## 2021-10-31 NOTE — Telephone Encounter (Signed)
Noted. Informed him that we were waiting to hear from anesthesia and would call as soon as we find out something.  ?

## 2021-11-01 ENCOUNTER — Other Ambulatory Visit: Payer: Self-pay

## 2021-11-01 ENCOUNTER — Ambulatory Visit (INDEPENDENT_AMBULATORY_CARE_PROVIDER_SITE_OTHER): Payer: Medicaid Other | Admitting: Cardiology

## 2021-11-01 ENCOUNTER — Encounter: Payer: Self-pay | Admitting: Cardiology

## 2021-11-01 VITALS — BP 90/60 | HR 66 | Ht 71.0 in | Wt 204.4 lb

## 2021-11-01 DIAGNOSIS — Z4502 Encounter for adjustment and management of automatic implantable cardiac defibrillator: Secondary | ICD-10-CM | POA: Diagnosis not present

## 2021-11-01 DIAGNOSIS — I5022 Chronic systolic (congestive) heart failure: Secondary | ICD-10-CM

## 2021-11-01 DIAGNOSIS — I4892 Unspecified atrial flutter: Secondary | ICD-10-CM

## 2021-11-01 DIAGNOSIS — I10 Essential (primary) hypertension: Secondary | ICD-10-CM

## 2021-11-01 NOTE — Progress Notes (Signed)
? ? ? ?Clinical Summary ?Mr. Hogsett is a 61 y.o.male seen today for follow up of the following medical problems.  ? ?1.Aflutter/Afib ?- history of afib and aflutter according to prior  cardiologists notes ?- admit 09/2021 with aflutter with RVR. Issues with hypotension on dilt gtt, started on IV amio and converted to oral prior to discharge. Was back in SR prior to d/c ?- has been on eliquis for stroke prevention ? ? ?- no recent palpitations ?- no bleeding on eliquis ? ? ? ?2. Chronic systolic HF/NICM ?- previously followed at [facility], prior cath without significant CAD ? ?07/2019 [facility] Cath: normal coronaries ?03/2021 echo [facility] : LVEF 30%, mild RV dysfunction ? ?- occasional SOB. No recent edema ?- home weights stable 200-204 ?- no recent lightheadendess, dizziness.  ?- AKI during admission,was resolving at d/c ?  ?3. AICD ?- needs to establish in device clinic ? ?Past Medical History:  ?Diagnosis Date  ? Acute kidney injury ([place])   ? Acute respiratory failure ([place])   ? Atrial flutter ([place])   ? on Eliquis  ? CHF (congestive heart failure) ([place])   ? EF 20-25% 10/15/21, AICD in place.  ? Coronary artery disease   ? Diabetes mellitus without complication ([place])   ? DVT (deep venous thrombosis) ([place]) 06/2019  ? Heart murmur   ? Hypertension   ? Presence of permanent cardiac pacemaker   ? Renal disorder   ? ? ? ?No Known Allergies ? ? ?Current Outpatient Medications  ?Medication Sig Dispense Refill  ? amiodarone (PACERONE) 400 MG tablet Take 1 tablet (400 mg total) by mouth 2 (two) times daily for 7 days, THEN 0.5 tablets (200 mg total) 2 (two) times daily for 14 days, THEN 0.5 tablets (200 mg total) daily. 43 tablet 0  ? apixaban (ELIQUIS) 5 MG TABS tablet Take 5 mg by mouth 2 (two) times daily.    ? atorvastatin (LIPITOR) 10 MG tablet Take 10 mg by mouth daily.    ? Cholecalciferol (VITAMIN D-3) 25 MCG (1000 UT) CAPS Take 1 capsule by mouth daily.    ? empagliflozin (JARDIANCE) 25 MG TABS tablet Take 1 tablet by mouth  daily.    ? furosemide (LASIX) 20 MG tablet Take 20 mg by mouth daily.    ? glipiZIDE (GLUCOTROL) 5 MG tablet Take 5 mg by mouth daily before breakfast.    ? insulin glargine (LANTUS) 100 UNIT/ML Solostar Pen Inject 32 Units into the skin daily.    ? magnesium oxide (MAG-OX) 400 MG tablet Take 400 mg by mouth 2 (two) times daily as needed.    ? metFORMIN (GLUCOPHAGE) 1000 MG tablet Take 1,000 mg by mouth 2 (two) times daily with a meal.    ? methocarbamol (ROBAXIN) 500 MG tablet Take 1 tablet (500 mg total) by mouth 2 (two) times daily. 20 tablet 0  ? metoprolol succinate (TOPROL-XL) 25 MG 24 hr tablet Take by mouth.    ? OVER THE COUNTER MEDICATION Take 2 tablets by mouth daily. Detox tablet    ? sacubitril-valsartan (ENTRESTO) 24-26 MG Take 1 tablet by mouth 2 (two) times daily.    ? ?No current facility-administered medications for this visit.  ? ? ? ?Past Surgical History:  ?Procedure Laterality Date  ? [place]ONOS[place]    [place].  ? CYST REMOVAL NECK    ? HERNIA REPAIR    ? TONSILLECTOMY    ? ? ? ?No Known Allergies ? ? ? ?Family  History  ?Problem Relation Age of Onset  ? Colon cancer Neg Hx   ? Colon polyps Neg Hx   ? ? ? ?Social History ?Mr. Friedhoff reports that he quit smoking about 2 years ago. His smoking use included cigarettes. He has a 20.00 pack-year smoking history. He has never used smokeless tobacco. ?Mr. Jamar reports that he does not currently use alcohol. ? ? ?Review of Systems ?CONSTITUTIONAL: No weight loss, fever, chills, weakness or fatigue.  ?HEENT: Eyes: No visual loss, blurred vision, double vision or yellow sclerae.No hearing loss, sneezing, congestion, runny nose or sore throat.  ?SKIN: No rash or itching.  ?CARDIOVASCULAR: per hpi ?RESPIRATORY: No shortness of breath, cough or sputum.  ?GASTROINTESTINAL: No anorexia, nausea, vomiting or diarrhea. No abdominal pain or blood.  ?GENITOURINARY: No burning on urination, no polyuria ?NEUROLOGICAL: No headache,  dizziness, syncope, paralysis, ataxia, numbness or tingling in the extremities. No change in bowel or bladder control.  ?MUSCULOSKELETAL: No muscle, back pain, joint pain or stiffness.  ?LYMPHATICS: No enlarged nodes. No history of splenectomy.  ?PSYCHIATRIC: No history of depression or anxiety.  ?ENDOCRINOLOGIC: No reports of sweating, cold or heat intolerance. No polyuria or polydipsia.  ?. ? ? ?Physical Examination ?Today's Vitals  ? 11/01/21 1100  ?BP: 90/60  ?Pulse: 66  ?SpO2: 98%  ?Weight: 204 lb 6.4 oz (92.7 kg)  ?Height: 5\' 11"  (1.803 m)  ? ?Body mass index is 28.51 kg/m?. ? ?Gen: resting comfortably, no acute distress ?HEENT: no scleral icterus, pupils equal round and reactive, no palptable cervical adenopathy,  ?CV: RRR, no m/r/g, no jvd ?Resp: Clear to auscultation bilaterally ?GI: abdomen is soft, non-tender, non-distended, normal bowel sounds, no hepatosplenomegaly ?MSK: extremities are warm, no edema.  ?Skin: warm, no rash ?Neuro:  no focal deficits ?Psych: appropriate affect ? ? ?Diagnostic Studies ? ?Result Date: 10/15/2021 ?   ECHOCARDIOGRAM REPORT   Patient Name:   Hari Jamil Date of Exam: 10/15/2021 Medical Rec #:  [id number]    Height:       71.0 in Accession #:    [id number]   Weight:       202.6 lb Date of Birth:  Nov 01, 1960    BSA:          2.120 m? Patient Age:    84 years     BP:           113/77 mmHg Patient Gender: M            HR:           66 bpm. Exam Location:  [facility] Procedure: 2D Echo, Cardiac Doppler, Color Doppler and Intracardiac            Opacification Agent Indications:    Atrial Flutter I48.92  History:        Patient has no prior history of Echocardiogram examinations.                 CHF; Defibrillator.  Sonographer:    Merrie Roof RDCS Referring Phys: Laie  1. Left ventricular ejection fraction, by estimation, is 20 to 25%. The left ventricle has severely decreased function. The left ventricle demonstrates global hypokinesis. The left  ventricular internal cavity size was mildly dilated. Left ventricular diastolic function could not be evaluated.  2. Right ventricular systolic function is normal. The right ventricular size is normal. Tricuspid regurgitation signal is inadequate for assessing PA pressure.  3. The pericardial effusion is posterior to the left ventricle.  4. The mitral valve is normal in structure. Trivial mitral valve regurgitation. No evidence of mitral stenosis.  5. The aortic valve is normal in structure. Aortic valve regurgitation is not visualized. No aortic stenosis is present.  6. Aortic dilatation noted. There is mild dilatation of the aortic root, measuring 39 mm. FINDINGS  Left Ventricle: Left ventricular ejection fraction, by estimation, is 20 to 25%. The left ventricle has severely decreased function. The left ventricle demonstrates global hypokinesis. The left ventricular internal cavity size was mildly dilated. There is no left ventricular hypertrophy. Left ventricular diastolic function could not be evaluated due to atrial fibrillation. Left ventricular diastolic function could not be evaluated. Right Ventricle: The right ventricular size is normal. No increase in right ventricular wall thickness. Right ventricular systolic function is normal. Tricuspid regurgitation signal is inadequate for assessing PA pressure. Left Atrium: Left atrial size was normal in size. Right Atrium: Right atrial size was normal in size. Pericardium: Trivial pericardial effusion is present. The pericardial effusion is posterior to the left ventricle. Mitral Valve: The mitral valve is normal in structure. Trivial mitral valve regurgitation. No evidence of mitral valve stenosis. Tricuspid Valve: The tricuspid valve is normal in structure. Tricuspid valve regurgitation is trivial. No evidence of tricuspid stenosis. Aortic Valve: The aortic valve is normal in structure. Aortic valve regurgitation is not visualized. No aortic stenosis is present.  Aortic valve mean gradient measures 2.0 mmHg. Aortic valve peak gradient measures 3.7 mmHg. Aortic valve area, by VTI measures 2.21 cm?. Pulmonic Valve: The pulmonic valve was normal in structure. Pulmonic valve regurg

## 2021-11-01 NOTE — Patient Instructions (Signed)
Medication Instructions:  ?Your physician recommends that you continue on your current medications as directed. Please refer to the Current Medication list given to you today.  ? ?Labwork: ?BMET, [place] ? ?Testing/Procedures: ?none ? ?Follow-Up: ?Your physician recommends that you schedule a follow-up appointment in: 4 months ? ?Any Other Special Instructions Will Be Listed Below (If Applicable). You have been referred to an EP provider. ? ?If you need a refill on your cardiac medications before your next appointment, please call your pharmacy. ? ?

## 2021-11-02 NOTE — Telephone Encounter (Signed)
Referral sent. Courtney when you call pt, he can reach [facility] at [phone]. Thanks! ?

## 2021-11-02 NOTE — Telephone Encounter (Signed)
Discussed case with Dr. Briant Cedar, anesthesiologist, who stated due to patient's cardiac history with heart failure, EF 20-25%, he is too high risk to have procedures here at [facility].  We will need to refer him to [facility].  ? ?[facility] Clinical Pool:  ?Please place referral to [place] for consult colonoscopy, history of colon polyps, unable to perform colonoscopy at [facility] due to EF 20-25%. ?

## 2021-11-02 NOTE — Addendum Note (Signed)
Addended by: Cheron Every on: 11/02/2021 12:54 PM ? ? Modules accepted: Orders ? ?

## 2021-11-03 LAB — BASIC METABOLIC PANEL
BUN/Creatinine Ratio: 14 (ref 9–20)
BUN: 21 mg/dL (ref 6–24)
CO2: 24 mmol/L (ref 20–29)
Calcium: 10.3 mg/dL — ABNORMAL HIGH (ref 8.7–10.2)
Chloride: 98 mmol/L (ref 96–106)
Creatinine, Ser: 1.46 mg/dL — ABNORMAL HIGH (ref 0.76–1.27)
Glucose: 166 mg/dL — ABNORMAL HIGH (ref 70–99)
Potassium: 5.7 mmol/L — ABNORMAL HIGH (ref 3.5–5.2)
Sodium: 139 mmol/L (ref 134–144)
eGFR: 55 mL/min/{1.73_m2} — ABNORMAL LOW (ref >59)

## 2021-11-03 LAB — MAGNESIUM: Magnesium: 2.1 mg/dL (ref 1.6–2.3)

## 2021-11-07 ENCOUNTER — Telehealth: Payer: Self-pay | Admitting: Gastroenterology

## 2021-11-07 NOTE — Telephone Encounter (Signed)
Hey Dr. Ardis Hughs,  ? ?We received a referral from [place] for patient to have just his colonoscopy with [facility] for history of colon polyps, unable to perform colonoscopy at [facility] due to EF 20-25%. Records are in Epic. As DOD, (3/15pm), could you please review and advise on scheduling? ? ?Thank you ?

## 2021-11-07 NOTE — Telephone Encounter (Signed)
Spoke to pt, informed him of recommendations. Pt voiced understanding. Informed him he should be receiving a call from [facility] soon and if not gave the number [phone] ?

## 2021-11-09 ENCOUNTER — Telehealth: Payer: Self-pay | Admitting: Internal Medicine

## 2021-11-09 NOTE — Telephone Encounter (Signed)
Spoke with [facility] advised of Dr. Ardis Hughs recommendation.  ?

## 2021-11-09 NOTE — Telephone Encounter (Signed)
Fowarding to [place]. See Dr. Eugenia Pancoast note in epic. Thanks! ?

## 2021-11-09 NOTE — Telephone Encounter (Signed)
Reviewed. Let's request his colonoscopy records from [facility] to review. I believe this information was included in his PCP note that was included in his referral to our office, but it is not scanned into the system. Once I have reviewed the records, we can refer him somewhere else, possibly [facility] or [facility] for colonoscopy.  ? ?Manuela Schwartz: Please request TCS records from [place].  ?

## 2021-11-09 NOTE — Telephone Encounter (Signed)
[facility] CALLED AND SAID DUE TO THE PHYSICIANS SCHEDULE BEING FULL WE THEY WILL NOT BE ABLE TO SEE THE PATIENT AND HE NEEDS TO BE REFERRED ELSEWHERE  ?

## 2021-11-10 NOTE — Telephone Encounter (Signed)
I have requested last colonoscopy and path from [facility] ?

## 2021-11-11 NOTE — Telephone Encounter (Signed)
Received pathology report from last colonoscopy dated 02/09/2015. He had 1 hyperplastic polyp removed from his rectum.  ? ?Spoke with patient. Stated this was his first colonoscopy. Based on these results, he is not due for a colonoscopy until June 2026. However, we will not be able to perform his colonoscopy here at [facility] as he is too high risk due to his cardiac history with heart failure and EF 20-25%. His PCP will need to refer him somewhere in [place] such as [facility], [facility], or other GI location such as [facility] when it is time for his next colonoscopy.   ? ?Please send this information to his PCP.  ?

## 2021-11-15 NOTE — Telephone Encounter (Signed)
Cc.ed to PCP ?

## 2021-11-17 ENCOUNTER — Telehealth: Payer: Self-pay | Admitting: *Deleted

## 2021-11-17 DIAGNOSIS — I10 Essential (primary) hypertension: Secondary | ICD-10-CM

## 2021-11-17 DIAGNOSIS — E875 Hyperkalemia: Secondary | ICD-10-CM

## 2021-11-17 MED ORDER — FUROSEMIDE 20 MG PO TABS: 20.0000 mg | ORAL_TABLET | ORAL | Status: DC

## 2021-11-17 NOTE — Telephone Encounter (Signed)
Laurine Blazer, LPN  ?4/59/9774 14:23 PM EDT Back to Top  ?  ?Notified, copy to pcp.  States that he does eat a lot of bananas - no extra potassium supplementation.  Will fax order to [facility] on [address].  He will do around 11/24/2021.    ? ?

## 2021-11-17 NOTE — Telephone Encounter (Signed)
-----   Message from Arnoldo Lenis, MD sent at 11/09/2021  3:48 PM EDT ----- ?Some ongoing decrease in kidney function, would change lasix to 20mg  every other day. Potassium was mildly elevated, please clarify he is not taking any potassium supplement. NEeds repeat bmet 1 week to recheck potassium ? ?Zandra Abts MD ?

## 2021-11-24 LAB — BASIC METABOLIC PANEL
BUN/Creatinine Ratio: 14 (ref 9–20)
BUN: 20 mg/dL (ref 6–24)
CO2: 22 mmol/L (ref 20–29)
Calcium: 9.6 mg/dL (ref 8.7–10.2)
Chloride: 97 mmol/L (ref 96–106)
Creatinine, Ser: 1.41 mg/dL — ABNORMAL HIGH (ref 0.76–1.27)
Glucose: 153 mg/dL — ABNORMAL HIGH (ref 70–99)
Potassium: 4.6 mmol/L (ref 3.5–5.2)
Sodium: 135 mmol/L (ref 134–144)
eGFR: 58 mL/min/{1.73_m2} — ABNORMAL LOW (ref >59)

## 2021-12-02 ENCOUNTER — Telehealth: Payer: Self-pay | Admitting: *Deleted

## 2021-12-02 MED ORDER — FUROSEMIDE 20 MG PO TABS
20.0000 mg | ORAL_TABLET | ORAL | Status: DC | PRN
Start: 2021-12-02 — End: 2022-06-01

## 2021-12-02 NOTE — Telephone Encounter (Signed)
Pt returning call regarding test results. Please advise ?

## 2021-12-02 NOTE — Telephone Encounter (Signed)
Laurine Blazer, LPN  ?01/25/44 99:77 AM EDT Back to Top  ?  ?Notified, copy to pcp.   ? ?

## 2021-12-02 NOTE — Telephone Encounter (Signed)
-----   Message from Arnoldo Lenis, MD sent at 11/30/2021  4:00 PM EDT ----- ?Renal function remains decreased but similar to recent admission. I would change lasix to 20mg  just prn swelling or 3 lbs weight gain. I belive from our last visit his home weights were 200-204, if got 207 or more could take lasix ? ?Zandra Abts MD ?

## 2021-12-02 NOTE — Telephone Encounter (Signed)
Reviewed results again with patient - he verbalized understanding.   ?

## 2021-12-08 ENCOUNTER — Encounter: Payer: Self-pay | Admitting: Internal Medicine

## 2021-12-08 ENCOUNTER — Ambulatory Visit (INDEPENDENT_AMBULATORY_CARE_PROVIDER_SITE_OTHER): Payer: Medicaid Other | Admitting: Internal Medicine

## 2021-12-08 VITALS — BP 112/80 | HR 64 | Ht 71.0 in | Wt 208.2 lb

## 2021-12-08 DIAGNOSIS — I5022 Chronic systolic (congestive) heart failure: Secondary | ICD-10-CM | POA: Diagnosis not present

## 2021-12-08 DIAGNOSIS — I4892 Unspecified atrial flutter: Secondary | ICD-10-CM

## 2021-12-08 DIAGNOSIS — I10 Essential (primary) hypertension: Secondary | ICD-10-CM

## 2021-12-08 MED ORDER — AMIODARONE HCL 200 MG PO TABS: 200.0000 mg | ORAL_TABLET | Freq: Every day | ORAL | 3 refills | Status: DC

## 2021-12-08 NOTE — Progress Notes (Signed)
? ? ? ? ?HPI ?[name] is referred by Dr. Harl Frye for consideration for atrial flutter ablation. He is a pleasant 61yo man with a longstanding non-ischemic CM, who has been on maximal medical therapy and is s/p ICD insertion 2 years ago. He was hospitalized a few weeks ago and was in atrial flutter with a RVR and was treated with meds and returned to NSR. He has not had recurrent symptoms. He has class 2 CHF symptoms. He also has a h/o DVT and is on systemic anti-coagulation. ?No Known Allergies ? ? ?Current Outpatient Medications  ?Medication Sig Dispense Refill  ? amiodarone (PACERONE) 200 MG tablet Take 1 tablet (200 mg total) by mouth daily. 90 tablet 3  ? apixaban (ELIQUIS) 5 MG TABS tablet Take 5 mg by mouth 2 (two) times daily.    ? atorvastatin (LIPITOR) 10 MG tablet Take 10 mg by mouth daily.    ? Cholecalciferol (VITAMIN D-3) 25 MCG (1000 UT) CAPS Take 1 capsule by mouth daily.    ? empagliflozin (JARDIANCE) 25 MG TABS tablet Take 1 tablet by mouth daily.    ? furosemide (LASIX) 20 MG tablet Take 1 tablet (20 mg total) by mouth as needed for edema (weight gain of 3lbs or more).    ? insulin glargine (LANTUS) 100 UNIT/ML Solostar Pen Inject 32 Units into the skin daily.    ? magnesium oxide (MAG-OX) 400 MG tablet Take 400 mg by mouth 2 (two) times daily as needed.    ? metFORMIN (GLUCOPHAGE) 1000 MG tablet Take 1,000 mg by mouth 2 (two) times daily with a meal.    ? metoprolol succinate (TOPROL-XL) 25 MG 24 hr tablet Take by mouth.    ? OVER THE COUNTER MEDICATION Take 2 tablets by mouth daily. Detox tablet    ? sacubitril-valsartan (ENTRESTO) 24-26 MG Take 1 tablet by mouth 2 (two) times daily.    ? sertraline (ZOLOFT) 50 MG tablet Take 50 mg by mouth daily.    ? spironolactone (ALDACTONE) 25 MG tablet Take 25 mg by mouth daily.    ? ?No current facility-administered medications for this visit.  ? ? ? ?Past Medical History:  ?Diagnosis Date  ? Acute kidney injury ([place])   ? Acute respiratory failure ([place])    ? Atrial flutter ([place])   ? on Eliquis  ? CHF (congestive heart failure) ([place])   ? EF 20-25% 10/15/21, AICD in place.  ? Coronary artery disease   ? Diabetes mellitus without complication ([place])   ? DVT (deep venous thrombosis) ([place]) 06/2019  ? Heart murmur   ? Hypertension   ? Presence of permanent cardiac pacemaker   ? Renal disorder   ? ? ?ROS: ? ? All systems reviewed and negative except as noted in the HPI. ? ? ?Past Surgical History:  ?Procedure Laterality Date  ? [place]OS[place]    [place] [facility] [place].  ? CYST REMOVAL NECK    ? HERNIA REPAIR    ? TONSILLECTOMY    ? ? ? ?Family History  ?Problem Relation Age of Onset  ? Hyperlipidemia Mother   ? Heart disease Mother   ?     has PPM  ? Diabetes Mother   ? Hypertension Mother   ? Heart disease Maternal Grandmother   ? Prostate cancer Maternal Grandfather   ? Colon cancer Neg Hx   ? Colon polyps Neg Hx   ? ? ? ?Social History  ? ?Socioeconomic History  ? Marital status:  Divorced  ?  Spouse name: Not on file  ? Number of children: Not on file  ? Years of education: Not on file  ? Highest education level: Not on file  ?Occupational History  ? Not on file  ?Tobacco Use  ? Smoking status: Former  ?  Packs/day: 1.00  ?  Years: 20.00  ?  Pack years: 20.00  ?  Types: Cigarettes  ?  Quit date: 07/07/2019  ?  Years since quitting: 2.4  ? Smokeless tobacco: Never  ?Vaping Use  ? Vaping Use: Never used  ?Substance and Sexual Activity  ? Alcohol use: Not Currently  ? Drug use: Not Currently  ?  Types: "Crack" cocaine  ?  Comment: last used over 2 years ago (10/24/21).  ? Sexual activity: Not on file  ?Other Topics Concern  ? Not on file  ?Social History Narrative  ? Not on file  ? ?Social Determinants of Health  ? ?Financial Resource Strain: Not on file  ?Food Insecurity: Not on file  ?Transportation Needs: Not on file  ?Physical Activity: Not on file  ?Stress: Not on file  ?Social Connections: Not on file  ?Intimate Partner Violence: Not on file  ? ? ? ?BP 112/80    Pulse 64   Ht 5\' 11"  (1.803 m)   Wt 208 lb 3.2 oz (94.4 kg)   SpO2 96%   BMI 29.04 kg/m?  ? ?Physical Exam: ? ?Well appearing NAD ?HEENT: Unremarkable ?Neck:  No JVD, no thyromegally ?Lymphatics:  No adenopathy ?Back:  No CVA tenderness ?Lungs:  Clear with no wheezes ?HEART:  Regular rate rhythm, no murmurs, no rubs, no clicks ?Abd:  soft, positive bowel sounds, no organomegally, no rebound, no guarding ?Ext:  2 plus pulses, no edema, no cyanosis, no clubbing ?Skin:  No rashes no nodules ?Neuro:  CN II through XII intact, motor grossly intact ? ?EKG - reviewed. NSR ? ?DEVICE  ?Not checked today ? ?Assess/Plan:  ?Atrial flutter - I discussed the treatment options with the patient and recommend catheter ablation. He will call us if he wishes to proceed. ?Chronic systolic heart failure - his symptoms are class 2. He will continue his current meds. ? ?Darryl Overlie Betty Daidone,MD ?

## 2021-12-08 NOTE — Patient Instructions (Signed)
Medication Instructions:  ?Your physician recommends that you continue on your current medications as directed. Please refer to the Current Medication list given to you today. ? ?*If you need a refill on your cardiac medications before your next appointment, please call your pharmacy* ? ? ?Lab Work: ?Your physician recommends that you return for lab work in: 1 week prior to your Ablation  ? ?If you have labs (blood work) drawn today and your tests are completely normal, you will receive your results only by: ?MyChart Message (if you have MyChart) OR ?A paper copy in the mail ?If you have any lab test that is abnormal or we need to change your treatment, we will call you to review the results. ? ? ?Testing/Procedures: ?Your physician has recommended that you have an ablation. Catheter ablation is a medical procedure used to treat some cardiac arrhythmias (irregular heartbeats). During catheter ablation, a Haydel, thin, flexible tube is put into a blood vessel in your groin (upper thigh), or neck. This tube is called an ablation catheter. It is then guided to your heart through the blood vessel. Radio frequency waves destroy small areas of heart tissue where abnormal heartbeats may cause an arrhythmia to start. Please see the instruction sheet given to you today.  ? ? ?Follow-Up: ?At [facility], you and your health needs are our priority.  As part of our continuing mission to provide you with exceptional heart care, we have created designated Provider Care Teams.  These Care Teams include your primary Cardiologist (physician) and Advanced Practice Providers (APPs -  Physician Assistants and Nurse Practitioners) who all work together to provide you with the care you need, when you need it. ? ?We recommend signing up for the patient portal called "MyChart".  Sign up information is provided on this After Visit Summary.  MyChart is used to connect with patients for Virtual Visits (Telemedicine).  Patients are able to  view lab/test results, encounter notes, upcoming appointments, etc.  Non-urgent messages can be sent to your provider as well.   ?To learn more about what you can do with MyChart, go to [email or web address].   ? ?Your next appointment:   ? After ablation  ? ?The format for your next appointment:   ?In Person ? ?Provider:   ?[name], MD  ? ? ?Other Instructions ?Dates for Ablation are May 9, 11, 17, 18  ? ?Important Information About Sugar ? ? ? ? ?  ?

## 2021-12-21 ENCOUNTER — Telehealth: Payer: Self-pay | Admitting: Cardiology

## 2021-12-21 MED ORDER — SPIRONOLACTONE 25 MG PO TABS: 25.0000 mg | ORAL_TABLET | Freq: Every day | ORAL | 2 refills | Status: DC

## 2021-12-21 MED ORDER — ENTRESTO 24-26 MG PO TABS: 1.0000 | ORAL_TABLET | Freq: Two times a day (BID) | ORAL | 2 refills | Status: DC

## 2021-12-21 NOTE — Telephone Encounter (Signed)
?*  STAT* If patient is at the pharmacy, call can be transferred to refill team. ? ? ?1. Which medications need to be refilled? (please list name of each medication and dose if known) ? sacubitril-valsartan (ENTRESTO) 24-26 MG ?spironolactone (ALDACTONE) 25 MG tablet ? ?2. Which pharmacy/location (including street and city if local pharmacy) is medication to be sent to? [id number] - [place], [address] ? ?3. Do they need a 30 day or 90 day supply? 90 day supply ? ? ?Patient is out of spironolactone.   ?

## 2021-12-28 ENCOUNTER — Telehealth: Payer: Self-pay | Admitting: Cardiology

## 2021-12-28 ENCOUNTER — Encounter: Payer: Self-pay | Admitting: *Deleted

## 2021-12-28 DIAGNOSIS — I4892 Unspecified atrial flutter: Secondary | ICD-10-CM

## 2021-12-28 NOTE — Telephone Encounter (Signed)
Returned call to pt to notify of date and time of ablation. No answer. Left msg to call back.  ?

## 2021-12-28 NOTE — Telephone Encounter (Signed)
Spoke with pt and informed of date and time of Ablation. Letter mailed.  ?

## 2021-12-28 NOTE — Telephone Encounter (Signed)
Pt came into [place] office wanting to schedule Ablation. ? ? I told pt someone would contact him to set that up.  ? ?Please call [phone] ?

## 2022-01-03 ENCOUNTER — Other Ambulatory Visit (HOSPITAL_COMMUNITY)
Admission: RE | Admit: 2022-01-03 | Discharge: 2022-01-03 | Disposition: A | Discharge status: Home / Self Care | Payer: Medicare Other | Source: Ambulatory Visit | Attending: Internal Medicine | Admitting: Internal Medicine

## 2022-01-03 DIAGNOSIS — I4892 Unspecified atrial flutter: Secondary | ICD-10-CM | POA: Diagnosis present

## 2022-01-03 LAB — BASIC METABOLIC PANEL
Anion gap: 7 (ref 5–15)
BUN: 22 mg/dL — ABNORMAL HIGH (ref 6–20)
CO2: 26 mmol/L (ref 22–32)
Calcium: 9.9 mg/dL (ref 8.9–10.3)
Chloride: 102 mmol/L (ref 98–111)
Creatinine, Ser: 1.35 mg/dL — ABNORMAL HIGH (ref 0.61–1.24)
GFR, Estimated: >60 mL/min (ref >60)
Glucose, Bld: 174 mg/dL — ABNORMAL HIGH (ref 70–99)
Potassium: 4.2 mmol/L (ref 3.5–5.1)
Sodium: 135 mmol/L (ref 135–145)

## 2022-01-03 LAB — CBC
HCT: 51.8 % (ref 39.0–52.0)
Hemoglobin: 16.6 g/dL (ref 13.0–17.0)
MCH: 27.3 pg (ref 26.0–34.0)
MCHC: 32 g/dL (ref 30.0–36.0)
MCV: 85.1 fL (ref 80.0–100.0)
Platelets: 316 10*3/uL (ref 150–400)
RBC: 6.09 MIL/uL — ABNORMAL HIGH (ref 4.22–5.81)
RDW: 18.2 % — ABNORMAL HIGH (ref 11.5–15.5)
WBC: 6 10*3/uL (ref 4.0–10.5)
nRBC: 0 % (ref 0.0–0.2)

## 2022-01-11 NOTE — Pre-Procedure Instructions (Signed)
Attempted to call patient regarding procedure instructions for tomorrow.  Left voice mail on the following items: Arrival time 0530 Nothing to eat or drink after midnight No meds AM of procedure Responsible person to drive you home and stay with you for 24 hrs  Have you missed any doses of anti-coagulant Elquis- take doses today none in the morning

## 2022-01-12 ENCOUNTER — Ambulatory Visit (HOSPITAL_COMMUNITY)
Admission: RE | Admit: 2022-01-12 | Discharge: 2022-01-12 | Disposition: A | Discharge status: Home / Self Care | Payer: Medicare Other | Source: Ambulatory Visit | Attending: Internal Medicine | Admitting: Internal Medicine

## 2022-01-12 ENCOUNTER — Encounter (HOSPITAL_COMMUNITY)
Admission: RE | Disposition: A | Discharge status: Home / Self Care | Payer: Self-pay | Source: Ambulatory Visit | Attending: Internal Medicine

## 2022-01-12 ENCOUNTER — Other Ambulatory Visit: Payer: Self-pay

## 2022-01-12 DIAGNOSIS — Z950 Presence of cardiac pacemaker: Secondary | ICD-10-CM | POA: Diagnosis not present

## 2022-01-12 DIAGNOSIS — I483 Typical atrial flutter: Secondary | ICD-10-CM | POA: Diagnosis present

## 2022-01-12 DIAGNOSIS — Z7984 Long term (current) use of oral hypoglycemic drugs: Secondary | ICD-10-CM | POA: Insufficient documentation

## 2022-01-12 DIAGNOSIS — Z86718 Personal history of other venous thrombosis and embolism: Secondary | ICD-10-CM | POA: Diagnosis not present

## 2022-01-12 DIAGNOSIS — E119 Type 2 diabetes mellitus without complications: Secondary | ICD-10-CM | POA: Diagnosis not present

## 2022-01-12 DIAGNOSIS — I110 Hypertensive heart disease with heart failure: Secondary | ICD-10-CM | POA: Insufficient documentation

## 2022-01-12 DIAGNOSIS — I5022 Chronic systolic (congestive) heart failure: Secondary | ICD-10-CM | POA: Insufficient documentation

## 2022-01-12 DIAGNOSIS — Z794 Long term (current) use of insulin: Secondary | ICD-10-CM | POA: Insufficient documentation

## 2022-01-12 DIAGNOSIS — Z87891 Personal history of nicotine dependence: Secondary | ICD-10-CM | POA: Diagnosis not present

## 2022-01-12 DIAGNOSIS — I428 Other cardiomyopathies: Secondary | ICD-10-CM | POA: Insufficient documentation

## 2022-01-12 DIAGNOSIS — Z7901 Long term (current) use of anticoagulants: Secondary | ICD-10-CM | POA: Insufficient documentation

## 2022-01-12 HISTORY — PX: A-FLUTTER ABLATION: EP1230

## 2022-01-12 LAB — GLUCOSE, CAPILLARY
Glucose-Capillary: 143 mg/dL — ABNORMAL HIGH (ref 70–99)
Glucose-Capillary: 117 mg/dL — ABNORMAL HIGH (ref 70–99)

## 2022-01-12 SURGERY — A-FLUTTER ABLATION

## 2022-01-12 MED ORDER — HEPARIN (PORCINE) IN NACL 1000-0.9 UT/500ML-% IV SOLN
INTRAVENOUS | Status: AC
  Filled 2022-01-12: qty 500

## 2022-01-12 MED ORDER — SODIUM CHLORIDE 0.9% FLUSH: 3.0000 mL | Freq: Two times a day (BID) | INTRAVENOUS | Status: DC

## 2022-01-12 MED ORDER — HEPARIN (PORCINE) IN NACL 1000-0.9 UT/500ML-% IV SOLN
INTRAVENOUS | Status: DC | PRN
  Administered 2022-01-12: 500 mL

## 2022-01-12 MED ORDER — BUPIVACAINE HCL (PF) 0.25 % IJ SOLN
INTRAMUSCULAR | Status: AC
  Filled 2022-01-12: qty 30

## 2022-01-12 MED ORDER — ACETAMINOPHEN 325 MG PO TABS: 650.0000 mg | ORAL_TABLET | ORAL | Status: DC | PRN

## 2022-01-12 MED ORDER — SODIUM CHLORIDE 0.9% FLUSH: 3.0000 mL | INTRAVENOUS | Status: DC | PRN

## 2022-01-12 MED ORDER — SODIUM CHLORIDE 0.9 % IV SOLN: INTRAVENOUS | Status: DC

## 2022-01-12 MED ORDER — FENTANYL CITRATE (PF) 100 MCG/2ML IJ SOLN
INTRAMUSCULAR | Status: DC | PRN
  Administered 2022-01-12 (×3): 25 ug via INTRAVENOUS
  Administered 2022-01-12: 12.5 ug via INTRAVENOUS
  Administered 2022-01-12 (×2): 25 ug via INTRAVENOUS

## 2022-01-12 MED ORDER — SODIUM CHLORIDE 0.9 % IV SOLN: 250.0000 mL | INTRAVENOUS | Status: DC | PRN

## 2022-01-12 MED ORDER — FENTANYL CITRATE (PF) 100 MCG/2ML IJ SOLN
INTRAMUSCULAR | Status: AC
  Filled 2022-01-12: qty 2

## 2022-01-12 MED ORDER — MIDAZOLAM HCL 5 MG/5ML IJ SOLN
INTRAMUSCULAR | Status: AC
  Filled 2022-01-12: qty 5

## 2022-01-12 MED ORDER — HEPARIN SODIUM (PORCINE) 1000 UNIT/ML IJ SOLN
INTRAMUSCULAR | Status: AC
  Filled 2022-01-12: qty 10

## 2022-01-12 MED ORDER — BUPIVACAINE HCL (PF) 0.25 % IJ SOLN
INTRAMUSCULAR | Status: DC | PRN
  Administered 2022-01-12: 20 mL

## 2022-01-12 MED ORDER — MIDAZOLAM HCL 5 MG/5ML IJ SOLN
INTRAMUSCULAR | Status: DC | PRN
  Administered 2022-01-12 (×4): 1 mg via INTRAVENOUS
  Administered 2022-01-12: 2 mg via INTRAVENOUS
  Administered 2022-01-12: 1 mg via INTRAVENOUS

## 2022-01-12 SURGICAL SUPPLY — 11 items
BAG SNAP BAND KOVER 36X36 (MISCELLANEOUS) ×1 IMPLANT
CATH SMTCH THERMOCOOL SF FJ (CATHETERS) ×1 IMPLANT
CATH WEB BI DIR CSDF CRV REPRO (CATHETERS) ×1 IMPLANT
MAT PREVALON FULL STRYKER (MISCELLANEOUS) ×1 IMPLANT
PACK EP LATEX FREE (CUSTOM PROCEDURE TRAY) ×2
PACK EP LF (CUSTOM PROCEDURE TRAY) ×1 IMPLANT
PAD DEFIB RADIO PHYSIO CONN (PAD) ×2 IMPLANT
PATCH CARTO3 (PAD) ×1 IMPLANT
SHEATH PINNACLE 7F 10CM (SHEATH) ×1 IMPLANT
SHEATH PINNACLE 8F 10CM (SHEATH) ×1 IMPLANT
TUBING SMART ABLATE COOLFLOW (TUBING) ×1 IMPLANT

## 2022-01-12 NOTE — Progress Notes (Signed)
Site area: rt groin femoral venous sheaths x2 Site Prior to Removal:  Level 0 Pressure Applied For: 15 minutes Manual:   yes Patient Status During Pull:  stable Post Pull Site:  Level 0 Post Pull Instructions Given:  yes Post Pull Pulses Present: rt dp palpable Dressing Applied:  gauze and tegaderm Bedrest begins @ 1000 Comments:

## 2022-01-12 NOTE — H&P (Signed)
HPI Darryl Frye is referred by Dr. Harl Bowie for consideration for atrial flutter ablation. He is a pleasant 61yo man with a longstanding non-ischemic CM, who has been on maximal medical therapy and is s/p ICD insertion 2 years ago. He was hospitalized a few weeks ago and was in atrial flutter with a RVR and was treated with meds and returned to NSR. He has not had recurrent symptoms. He has class 2 CHF symptoms. He also has a h/o DVT and is on systemic anti-coagulation. No Known Allergies           Current Outpatient Medications  Medication Sig Dispense Refill   amiodarone (PACERONE) 200 MG tablet Take 1 tablet (200 mg total) by mouth daily. 90 tablet 3   apixaban (ELIQUIS) 5 MG TABS tablet Take 5 mg by mouth 2 (two) times daily.       atorvastatin (LIPITOR) 10 MG tablet Take 10 mg by mouth daily.       Cholecalciferol (VITAMIN D-3) 25 MCG (1000 UT) CAPS Take 1 capsule by mouth daily.       empagliflozin (JARDIANCE) 25 MG TABS tablet Take 1 tablet by mouth daily.       furosemide (LASIX) 20 MG tablet Take 1 tablet (20 mg total) by mouth as needed for edema (weight gain of 3lbs or more).       insulin glargine (LANTUS) 100 UNIT/ML Solostar Pen Inject 32 Units into the skin daily.       magnesium oxide (MAG-OX) 400 MG tablet Take 400 mg by mouth 2 (two) times daily as needed.       metFORMIN (GLUCOPHAGE) 1000 MG tablet Take 1,000 mg by mouth 2 (two) times daily with a meal.       metoprolol succinate (TOPROL-XL) 25 MG 24 hr tablet Take by mouth.       OVER THE COUNTER MEDICATION Take 2 tablets by mouth daily. Detox tablet       sacubitril-valsartan (ENTRESTO) 24-26 MG Take 1 tablet by mouth 2 (two) times daily.       sertraline (ZOLOFT) 50 MG tablet Take 50 mg by mouth daily.       spironolactone (ALDACTONE) 25 MG tablet Take 25 mg by mouth daily.        No current facility-administered medications for this visit.            Past Medical History:  Diagnosis Date   Acute kidney  injury ([place])     Acute respiratory failure ([place])     Atrial flutter ([place])      on Eliquis   CHF (congestive heart failure) ([place])      EF 20-25% 10/15/21, AICD in place.   Coronary artery disease     Diabetes mellitus without complication ([facility])     DVT (deep venous thrombosis) ([place]) 06/2019   Heart murmur     Hypertension     Presence of permanent cardiac pacemaker     Renal disorder        ROS:    All systems reviewed and negative except as noted in the HPI.          Past Surgical History:  Procedure Laterality Date   [place]OS[place]        [place] [place] [facility] patient.   CYST REMOVAL NECK       HERNIA REPAIR       TONSILLECTOMY  Family History  Problem Relation Age of Onset   Hyperlipidemia Mother     Heart disease Mother          has PPM   Diabetes Mother     Hypertension Mother     Heart disease Maternal Grandmother     Prostate cancer Maternal Grandfather     Colon cancer Neg Hx     Colon polyps Neg Hx          Social History         Socioeconomic History   Marital status: Divorced      Spouse name: Not on file   Number of children: Not on file   Years of education: Not on file   Highest education level: Not on file  Occupational History   Not on file  Tobacco Use   Smoking status: Former      Packs/day: 1.00      Years: 20.00      Pack years: 20.00      Types: Cigarettes      Quit date: 07/07/2019      Years since quitting: 2.4   Smokeless tobacco: Never  Vaping Use   Vaping Use: Never used  Substance and Sexual Activity   Alcohol use: Not Currently   Drug use: Not Currently      Types: "Crack" cocaine      Comment: last used over 2 years ago (10/24/21).   Sexual activity: Not on file  Other Topics Concern   Not on file  Social History Narrative   Not on file    Social Determinants of Health    Financial Resource Strain: Not on file  Food Insecurity: Not on file  Transportation Needs: Not on file  Physical  Activity: Not on file  Stress: Not on file  Social Connections: Not on file  Intimate Partner Violence: Not on file        BP 112/80   Pulse 64   Ht 5\' 11"  (1.803 m)   Wt 208 lb 3.2 oz (94.4 kg)   SpO2 96%   BMI 29.04 kg/m    Physical Exam:   Well appearing NAD HEENT: Unremarkable Neck:  No JVD, no thyromegally Lymphatics:  No adenopathy Back:  No CVA tenderness Lungs:  Clear with no wheezes HEART:  Regular rate rhythm, no murmurs, no rubs, no clicks Abd:  soft, positive bowel sounds, no organomegally, no rebound, no guarding Ext:  2 plus pulses, no edema, no cyanosis, no clubbing Skin:  No rashes no nodules Neuro:  CN II through XII intact, motor grossly intact   EKG - reviewed. NSR   DEVICE  Not checked today   Assess/Plan:  Atrial flutter - I discussed the treatment options with the patient and recommend catheter ablation. He will call us if he wishes to proceed. Chronic systolic heart failure - his symptoms are class 2. He will continue his current meds.   Darryl Overlie Raford Brissett,MD

## 2022-01-12 NOTE — Discharge Instructions (Signed)

## 2022-01-13 ENCOUNTER — Encounter (HOSPITAL_COMMUNITY): Payer: Self-pay | Admitting: Internal Medicine

## 2022-01-13 MED FILL — Heparin Sodium (Porcine) Inj 1000 Unit/ML: INTRAMUSCULAR | Qty: 10 | Status: AC

## 2022-01-31 ENCOUNTER — Ambulatory Visit (INDEPENDENT_AMBULATORY_CARE_PROVIDER_SITE_OTHER): Payer: Medicare Other | Admitting: Internal Medicine

## 2022-01-31 ENCOUNTER — Encounter: Payer: Self-pay | Admitting: Internal Medicine

## 2022-01-31 VITALS — BP 118/80 | HR 62 | Ht 71.0 in | Wt 213.0 lb

## 2022-01-31 DIAGNOSIS — I4892 Unspecified atrial flutter: Secondary | ICD-10-CM | POA: Diagnosis not present

## 2022-01-31 NOTE — Progress Notes (Signed)
HPI [name] returns today for followup. He is a pleasant 61 yo man with a h/o non-ischemic CM, s/p ICD insertion who carries a diagnosis of atrial fib and flutter but mostly flutter and underwent ablation of typical atrial flutter about a month ago. He has done well in the interim. He has a h/o DVT and is on eliquis for that. He denies any ICD therapies.  No Known Allergies   Current Outpatient Medications  Medication Sig Dispense Refill   amiodarone (PACERONE) 200 MG tablet Take 1 tablet (200 mg total) by mouth daily. 90 tablet 3   apixaban (ELIQUIS) 5 MG TABS tablet Take 5 mg by mouth 2 (two) times daily.     atorvastatin (LIPITOR) 10 MG tablet Take 10 mg by mouth daily.     Cholecalciferol (VITAMIN D-3) 25 MCG (1000 UT) CAPS Take 1 capsule by mouth daily.     empagliflozin (JARDIANCE) 25 MG TABS tablet Take 1 tablet by mouth daily.     furosemide (LASIX) 20 MG tablet Take 1 tablet (20 mg total) by mouth as needed for edema (weight gain of 3lbs or more).     insulin glargine (LANTUS) 100 UNIT/ML Solostar Pen Inject 32 Units into the skin daily.     magnesium oxide (MAG-OX) 400 MG tablet Take 400 mg by mouth 2 (two) times daily as needed.     metFORMIN (GLUCOPHAGE) 1000 MG tablet Take 1,000 mg by mouth 2 (two) times daily with a meal.     metoprolol succinate (TOPROL-XL) 25 MG 24 hr tablet Take by mouth.     OVER THE COUNTER MEDICATION Take 2 tablets by mouth daily. Detox tablet     sacubitril-valsartan (ENTRESTO) 24-26 MG Take 1 tablet by mouth 2 (two) times daily. 180 tablet 2   sertraline (ZOLOFT) 50 MG tablet Take 50 mg by mouth daily.     spironolactone (ALDACTONE) 25 MG tablet Take 1 tablet (25 mg total) by mouth daily. 90 tablet 2   No current facility-administered medications for this visit.     Past Medical History:  Diagnosis Date   Acute kidney injury ([place])    Acute respiratory failure ([place])    Atrial flutter ([place])    on Eliquis   CHF (congestive heart failure)  ([place])    EF 20-25% 10/15/21, AICD in place.   Coronary artery disease    Diabetes mellitus without complication ([facility])    DVT (deep venous thrombosis) ([place]) 06/2019   Heart murmur    Hypertension    Presence of permanent cardiac pacemaker    Renal disorder     ROS:   All systems reviewed and negative except as noted in the HPI.   Past Surgical History:  Procedure Laterality Date   A-FLUTTER ABLATION N/A 01/12/2022   Procedure: A-FLUTTER ABLATION;  Surgeon: Darryl Lance, MD;  Location: [place] [facility];  Service: Cardiovascular;  Laterality: N/A;   COLONOSCOPY     Dr. Posey Pronto; One 4-6 mm hyperplastic rectal polyp, mid left-sided diverticulosis.   CYST REMOVAL NECK     HERNIA REPAIR     TONSILLECTOMY       Family History  Problem Relation Age of Onset   Hyperlipidemia Mother    Heart disease Mother        has PPM   Diabetes Mother    Hypertension Mother    Heart disease Maternal Grandmother    Prostate cancer Maternal Grandfather    Colon cancer Neg Hx  Colon polyps Neg Hx      Social History   Socioeconomic History   Marital status: Divorced    Spouse name: Not on file   Number of children: Not on file   Years of education: Not on file   Highest education level: Not on file  Occupational History   Not on file  Tobacco Use   Smoking status: Former    Packs/day: 1.00    Years: 20.00    Total pack years: 20.00    Types: Cigarettes    Quit date: 07/07/2019    Years since quitting: 2.5   Smokeless tobacco: Never  Vaping Use   Vaping Use: Never used  Substance and Sexual Activity   Alcohol use: Not Currently   Drug use: Not Currently    Types: "Crack" cocaine    Comment: last used over 2 years ago (10/24/21).   Sexual activity: Not on file  Other Topics Concern   Not on file  Social History Narrative   Not on file   Social Determinants of Health   Financial Resource Strain: Not on file  Food Insecurity: Not on file  Transportation Needs: Not on  file  Physical Activity: Not on file  Stress: Not on file  Social Connections: Not on file  Intimate Partner Violence: Not on file     BP 118/80   Pulse 62   Ht 5\' 11"  (1.803 m)   Wt 213 lb (96.6 kg)   SpO2 96%   BMI 29.71 kg/m   Physical Exam:  Well appearing NAD HEENT: Unremarkable Neck:  No JVD, no thyromegally Lymphatics:  No adenopathy Back:  No CVA tenderness Lungs:  Clear with no wheezes HEART:  Regular rate rhythm, no murmurs, no rubs, no clicks Abd:  soft, positive bowel sounds, no organomegally, no rebound, no guarding Ext:  2 plus pulses, no edema, no cyanosis, no clubbing Skin:  No rashes no nodules Neuro:  CN II through XII intact, motor grossly intact  EKG - NSR  DEVICE  Normal device function.  See PaceArt for details.   Assess/Plan:  Atrial flutter - he is s/p ablation and doing well.  Atrial fib - no evidence of this. I will have him stop amiodarone. ICD - his medtronic DDD ICD is working normally.  Chronic systolic heart failure - his symptoms are class 2. No change in meds.  Carleene Overlie Gahel Safley,MD

## 2022-01-31 NOTE — Patient Instructions (Signed)
Medication Instructions:  Your physician has recommended you make the following change in your medication:   Stop Taking Amiodarone   *If you need a refill on your cardiac medications before your next appointment, please call your pharmacy*   Lab Work: NONE   If you have labs (blood work) drawn today and your tests are completely normal, you will receive your results only by: [place] (if you have MyChart) OR A paper copy in the mail If you have any lab test that is abnormal or we need to change your treatment, we will call you to review the results.   Testing/Procedures: NONE   Follow-Up: At [facility], you and your health needs are our priority.  As part of our continuing mission to provide you with exceptional heart care, we have created designated Provider Care Teams.  These Care Teams include your primary Cardiologist (physician) and Advanced Practice Providers (APPs -  Physician Assistants and Nurse Practitioners) who all work together to provide you with the care you need, when you need it.  We recommend signing up for the patient portal called "MyChart".  Sign up information is provided on this After Visit Summary.  MyChart is used to connect with patients for Virtual Visits (Telemedicine).  Patients are able to view lab/test results, encounter notes, upcoming appointments, etc.  Non-urgent messages can be sent to your provider as well.   To learn more about what you can do with MyChart, go to [email or web address].    Your next appointment:   1 year(s)  The format for your next appointment:   In Person  Provider:   [name], MD    Other Instructions  Please call device clinic at  [phone] when you get your monitor   Important Information About Sugar

## 2022-03-13 ENCOUNTER — Ambulatory Visit (INDEPENDENT_AMBULATORY_CARE_PROVIDER_SITE_OTHER): Payer: Medicare Other | Admitting: Cardiology

## 2022-03-13 ENCOUNTER — Encounter: Payer: Self-pay | Admitting: Cardiology

## 2022-03-13 VITALS — BP 102/70 | HR 70 | Ht 71.0 in | Wt 207.8 lb

## 2022-03-13 DIAGNOSIS — D6869 Other thrombophilia: Secondary | ICD-10-CM

## 2022-03-13 DIAGNOSIS — I4892 Unspecified atrial flutter: Secondary | ICD-10-CM | POA: Diagnosis not present

## 2022-03-13 DIAGNOSIS — I5022 Chronic systolic (congestive) heart failure: Secondary | ICD-10-CM

## 2022-03-13 NOTE — Addendum Note (Signed)
Addended by: Sung Amabile on: 03/13/2022 09:53 AM   Modules accepted: Orders

## 2022-03-13 NOTE — Progress Notes (Signed)
Clinical Summary Mr. Njoku is a 62 y.o.maleseen today for follow up of the following medical problems.    1.Aflutter/Afib - history of afib and aflutter according to prior  cardiologists notes - admit 09/2021 with aflutter with RVR. Issues with hypotension on dilt gtt, started on IV amio and converted to oral prior to discharge. Was back in SR prior to d/c - has been on eliquis for stroke prevention     - no recent palpitations - no bleeding on eliquis    01/12/22 aflutter ablation with Dr [name] - amio was stopped - no recent palpitations - no bleedign on eliquis      2. Chronic systolic HF/NICM - previously followed at [facility] and [facility], prior cath without significant CAD   06/2019 echo [facility]: LVEF <10% 07/2019 [facility]: normal coronaries 03/2021 echo [facility] : LVEF 30%, mild RV dysfunction - 09/2021 echo: LVEF 20-25%, normal RV   - no SOB/DOE, no recent edema - compliant with meds, has not needed lasix prn. - soft bp's have limited med titration     3. AICD - normal device check 01/2022  Past Medical History:  Diagnosis Date   Acute kidney injury ([facility])    Acute respiratory failure ([place])    Atrial flutter ([place])    on Eliquis   CHF (congestive heart failure) ([place])    EF 20-25% 10/15/21, AICD in place.   Coronary artery disease    Diabetes mellitus without complication (H[facility])    DVT (deep venous thrombosis) ([place]) 06/2019   Heart murmur    Hypertension    Presence of permanent cardiac pacemaker    Renal disorder      No Known Allergies   Current Outpatient Medications  Medication Sig Dispense Refill   apixaban (ELIQUIS) 5 MG TABS tablet Take 5 mg by mouth 2 (two) times daily.     atorvastatin (LIPITOR) 10 MG tablet Take 10 mg by mouth daily.     Cholecalciferol (VITAMIN D-3) 25 MCG (1000 UT) CAPS Take 1 capsule by mouth daily.     empagliflozin (JARDIANCE) 25 MG TABS tablet Take 1 tablet by mouth daily.     furosemide (LASIX) 20 MG tablet Take  1 tablet (20 mg total) by mouth as needed for edema (weight gain of 3lbs or more).     insulin glargine (LANTUS) 100 UNIT/ML Solostar Pen Inject 32 Units into the skin daily.     magnesium oxide (MAG-OX) 400 MG tablet Take 400 mg by mouth 2 (two) times daily as needed.     metFORMIN (GLUCOPHAGE) 1000 MG tablet Take 1,000 mg by mouth 2 (two) times daily with a meal.     metoprolol succinate (TOPROL-XL) 25 MG 24 hr tablet Take by mouth.     OVER THE COUNTER MEDICATION Take 2 tablets by mouth daily. Detox tablet     sacubitril-valsartan (ENTRESTO) 24-26 MG Take 1 tablet by mouth 2 (two) times daily. 180 tablet 2   sertraline (ZOLOFT) 50 MG tablet Take 50 mg by mouth daily.     spironolactone (ALDACTONE) 25 MG tablet Take 1 tablet (25 mg total) by mouth daily. 90 tablet 2   No current facility-administered medications for this visit.     Past Surgical History:  Procedure Laterality Date   A-FLUTTER ABLATION N/A 01/12/2022   Procedure: A-FLUTTER ABLATION;  Surgeon: Evans Lance, MD;  Location: [facility];  Service: Cardiovascular;  Laterality: N/A;   COLONOSCOPY     Dr. Posey Pronto; One  4-6 mm hyperplastic rectal polyp, mid left-sided diverticulosis.   CYST REMOVAL NECK     HERNIA REPAIR     TONSILLECTOMY       No Known Allergies    Family History  Problem Relation Age of Onset   Hyperlipidemia Mother    Heart disease Mother        has PPM   Diabetes Mother    Hypertension Mother    Heart disease Maternal Grandmother    Prostate cancer Maternal Grandfather    Colon cancer Neg Hx    Colon polyps Neg Hx      Social History Mr. Heo reports that he quit smoking about 2 years ago. His smoking use included cigarettes. He has a 20.00 pack-year smoking history. He has never used smokeless tobacco. Mr. Ransome reports that he does not currently use alcohol.   Review of Systems CONSTITUTIONAL: No weight loss, fever, chills, weakness or fatigue.  HEENT: Eyes: No visual loss,  blurred vision, double vision or yellow sclerae.No hearing loss, sneezing, congestion, runny nose or sore throat.  SKIN: No rash or itching.  CARDIOVASCULAR: per hpi RESPIRATORY: No shortness of breath, cough or sputum.  GASTROINTESTINAL: No anorexia, nausea, vomiting or diarrhea. No abdominal pain or blood.  GENITOURINARY: No burning on urination, no polyuria NEUROLOGICAL: No headache, dizziness, syncope, paralysis, ataxia, numbness or tingling in the extremities. No change in bowel or bladder control.  MUSCULOSKELETAL: No muscle, back pain, joint pain or stiffness.  LYMPHATICS: No enlarged nodes. No history of splenectomy.  PSYCHIATRIC: No history of depression or anxiety.  ENDOCRINOLOGIC: No reports of sweating, cold or heat intolerance. No polyuria or polydipsia.  Marland Kitchen   Physical Examination Today's Vitals   03/13/22 0909  BP: 102/70  Pulse: 70  SpO2: 96%  Weight: 207 lb 12.8 oz (94.3 kg)  Height: 5\' 11"  (1.803 m)   Body mass index is 28.98 kg/m.  Gen: resting comfortably, no acute distress HEENT: no scleral icterus, pupils equal round and reactive, no palptable cervical adenopathy,  CV: RRR, no m/r/g, no jvd Resp: Clear to auscultation bilaterally GI: abdomen is soft, non-tender, non-distended, normal bowel sounds, no hepatosplenomegaly MSK: extremities are warm, no edema.  Skin: warm, no rash Neuro:  no focal deficits Psych: appropriate affect   Diagnostic Studies  Result Date: 10/15/2021    ECHOCARDIOGRAM REPORT   Patient Name:   Cindy Reichl Date of Exam: 10/15/2021 Medical Rec #:  [id number]    Height:       71.0 in Accession #:    [id number]   Weight:       202.6 lb Date of Birth:  Aug 04, 1961    BSA:          2.120 m Patient Age:    65 years     BP:           113/77 mmHg Patient Gender: M            HR:           66 bpm. Exam Location:  [facility] Procedure: 2D Echo, Cardiac Doppler, Color Doppler and Intracardiac            Opacification Agent Indications:    Atrial  Flutter I48.92  History:        Patient has no prior history of Echocardiogram examinations.                 CHF; Defibrillator.  Sonographer:    [place] Referring Phys: [phone] JACOB  J STINSON IMPRESSIONS  1. Left ventricular ejection fraction, by estimation, is 20 to 25%. The left ventricle has severely decreased function. The left ventricle demonstrates global hypokinesis. The left ventricular internal cavity size was mildly dilated. Left ventricular diastolic function could not be evaluated.  2. Right ventricular systolic function is normal. The right ventricular size is normal. Tricuspid regurgitation signal is inadequate for assessing PA pressure.  3. The pericardial effusion is posterior to the left ventricle.  4. The mitral valve is normal in structure. Trivial mitral valve regurgitation. No evidence of mitral stenosis.  5. The aortic valve is normal in structure. Aortic valve regurgitation is not visualized. No aortic stenosis is present.  6. Aortic dilatation noted. There is mild dilatation of the aortic root, measuring 39 mm. FINDINGS  Left Ventricle: Left ventricular ejection fraction, by estimation, is 20 to 25%. The left ventricle has severely decreased function. The left ventricle demonstrates global hypokinesis. The left ventricular internal cavity size was mildly dilated. There is no left ventricular hypertrophy. Left ventricular diastolic function could not be evaluated due to atrial fibrillation. Left ventricular diastolic function could not be evaluated. Right Ventricle: The right ventricular size is normal. No increase in right ventricular wall thickness. Right ventricular systolic function is normal. Tricuspid regurgitation signal is inadequate for assessing PA pressure. Left Atrium: Left atrial size was normal in size. Right Atrium: Right atrial size was normal in size. Pericardium: Trivial pericardial effusion is present. The pericardial effusion is posterior to the left ventricle.  Mitral Valve: The mitral valve is normal in structure. Trivial mitral valve regurgitation. No evidence of mitral valve stenosis. Tricuspid Valve: The tricuspid valve is normal in structure. Tricuspid valve regurgitation is trivial. No evidence of tricuspid stenosis. Aortic Valve: The aortic valve is normal in structure. Aortic valve regurgitation is not visualized. No aortic stenosis is present. Aortic valve mean gradient measures 2.0 mmHg. Aortic valve peak gradient measures 3.7 mmHg. Aortic valve area, by VTI measures 2.21 cm. Pulmonic Valve: The pulmonic valve was normal in structure. Pulmonic valve regurgitation is not visualized. No evidence of pulmonic stenosis. Aorta: Aortic dilatation noted. There is mild dilatation of the aortic root, measuring 39 mm. Venous: The inferior vena cava was not well visualized. IAS/Shunts: No atrial level shunt detected by color flow Doppler. Additional Comments: A device lead is visualized.  LEFT VENTRICLE PLAX 2D LVIDd:         5.80 cm LVIDs:         5.40 cm LV PW:         1.10 cm LV IVS:        0.90 cm LVOT diam:     2.00 cm LV SV:         36 LV SV Index:   17 LVOT Area:     3.14 cm  RIGHT VENTRICLE RV Basal diam:  3.40 cm RV S prime:     7.94 cm/s TAPSE (M-mode): 1.2 cm LEFT ATRIUM             Index        RIGHT ATRIUM           Index LA diam:        3.30 cm 1.56 cm/m   RA Area:     16.70 cm LA Vol (A2C):   51.3 ml 24.20 ml/m  RA Volume:   48.10 ml  22.69 ml/m LA Vol (A4C):   45.6 ml 21.51 ml/m LA Biplane Vol: 50.1 ml 23.63 ml/m  AORTIC  VALVE AV Area (Vmax):    1.95 cm AV Area (Vmean):   1.79 cm AV Area (VTI):     2.21 cm AV Vmax:           96.50 cm/s AV Vmean:          64.600 cm/s AV VTI:            0.165 m AV Peak Grad:      3.7 mmHg AV Mean Grad:      2.0 mmHg LVOT Vmax:         60.00 cm/s LVOT Vmean:        36.800 cm/s LVOT VTI:          0.116 m LVOT/AV VTI ratio: 0.70  AORTA Ao Root diam: 3.90 cm Ao Asc diam:  3.20 cm  SHUNTS Systemic VTI:  0.12 m Systemic  Diam: 2.00 cm Fransico Him MD Electronically signed by Fransico Him MD Signature Date/Time: 10/15/2021/11:47:19 AM    Final        Assessment and Plan  1.Aflutter/afib/acquired thrombophilia - s/p aflutter ablation with Dr [name], amio discontinued around that time - no recent symptoms - continue current meds including eliquis for stroke prevention   2. Chronic systolic HF/NICM - previously followed at [facility] and [facility] - no recent symptoms - medical therapy titration limited by soft bp's - given persistent severe systolic dysfunction will have him establish with HF clinic in [place] continue to follow with EP      Arnoldo Lenis, MD

## 2022-03-13 NOTE — Patient Instructions (Addendum)
Medication Instructions:  Your physician recommends that you continue on your current medications as directed. Please refer to the Current Medication list given to you today.   Labwork: None  Testing/Procedures: None  Follow-Up:  Your physician recommends that you schedule a follow-up appointment in: 4 months  Any Other Special Instructions Will Be Listed Below (If Applicable).  You have been referred to heart failure clinic.  If you need a refill on your cardiac medications before your next appointment, please call your pharmacy.

## 2022-05-08 ENCOUNTER — Ambulatory Visit (INDEPENDENT_AMBULATORY_CARE_PROVIDER_SITE_OTHER): Payer: Medicare Other

## 2022-05-08 DIAGNOSIS — I5022 Chronic systolic (congestive) heart failure: Secondary | ICD-10-CM | POA: Diagnosis not present

## 2022-05-09 LAB — CUP PACEART REMOTE DEVICE CHECK
Battery Remaining Longevity: 111 mo
Battery Voltage: 3 V
Brady Statistic AP VP Percent: 0.02 %
Brady Statistic AP VS Percent: 14.36 %
Brady Statistic AS VP Percent: 0.03 %
Brady Statistic AS VS Percent: 85.59 %
Brady Statistic RA Percent Paced: 14.37 %
Brady Statistic RV Percent Paced: 0.05 %
Date Time Interrogation Session: 20230918133206
HighPow Impedance: 66 Ohm
Implantable Lead Implant Date: 20210929
Implantable Lead Location: 753860
Implantable Pulse Generator Implant Date: 20210929
Lead Channel Impedance Value: 304 Ohm
Lead Channel Impedance Value: 399 Ohm
Lead Channel Impedance Value: 475 Ohm
Lead Channel Pacing Threshold Amplitude: 0.625 V
Lead Channel Pacing Threshold Amplitude: 0.625 V
Lead Channel Pacing Threshold Pulse Width: 0.4 ms
Lead Channel Pacing Threshold Pulse Width: 0.4 ms
Lead Channel Sensing Intrinsic Amplitude: 23.25 mV
Lead Channel Sensing Intrinsic Amplitude: 23.25 mV
Lead Channel Sensing Intrinsic Amplitude: 5.375 mV
Lead Channel Sensing Intrinsic Amplitude: 5.375 mV
Lead Channel Setting Pacing Amplitude: 1.5 V
Lead Channel Setting Pacing Amplitude: 2 V
Lead Channel Setting Pacing Pulse Width: 0.4 ms
Lead Channel Setting Sensing Sensitivity: 0.3 mV

## 2022-05-22 NOTE — Progress Notes (Signed)
Remote ICD transmission.   

## 2022-05-31 ENCOUNTER — Emergency Department (HOSPITAL_COMMUNITY)
Admission: EM | Admit: 2022-05-31 | Discharge: 2022-06-01 | Disposition: A | Discharge status: Home / Self Care | Payer: Medicare Other | Attending: Emergency Medicine | Admitting: Emergency Medicine

## 2022-05-31 ENCOUNTER — Encounter (HOSPITAL_COMMUNITY): Payer: Self-pay | Admitting: Emergency Medicine

## 2022-05-31 ENCOUNTER — Telehealth: Payer: Self-pay | Admitting: Cardiology

## 2022-05-31 ENCOUNTER — Other Ambulatory Visit: Payer: Self-pay

## 2022-05-31 ENCOUNTER — Emergency Department (HOSPITAL_COMMUNITY): Payer: Medicare Other

## 2022-05-31 DIAGNOSIS — Z7984 Long term (current) use of oral hypoglycemic drugs: Secondary | ICD-10-CM | POA: Diagnosis not present

## 2022-05-31 DIAGNOSIS — R531 Weakness: Secondary | ICD-10-CM | POA: Diagnosis not present

## 2022-05-31 DIAGNOSIS — Z79899 Other long term (current) drug therapy: Secondary | ICD-10-CM | POA: Insufficient documentation

## 2022-05-31 DIAGNOSIS — Z7982 Long term (current) use of aspirin: Secondary | ICD-10-CM | POA: Insufficient documentation

## 2022-05-31 DIAGNOSIS — Z794 Long term (current) use of insulin: Secondary | ICD-10-CM | POA: Diagnosis not present

## 2022-05-31 DIAGNOSIS — R0602 Shortness of breath: Secondary | ICD-10-CM | POA: Diagnosis present

## 2022-05-31 DIAGNOSIS — I509 Heart failure, unspecified: Secondary | ICD-10-CM | POA: Diagnosis not present

## 2022-05-31 LAB — CBC WITH DIFFERENTIAL/PLATELET
Abs Immature Granulocytes: 0.01 10*3/uL (ref 0.00–0.07)
Basophils Absolute: 0.1 10*3/uL (ref 0.0–0.1)
Basophils Relative: 2 %
Eosinophils Absolute: 0.3 10*3/uL (ref 0.0–0.5)
Eosinophils Relative: 6 %
HCT: 49.7 % (ref 39.0–52.0)
Hemoglobin: 16.1 g/dL (ref 13.0–17.0)
Immature Granulocytes: 0 %
Lymphocytes Relative: 23 %
Lymphs Abs: 1.4 10*3/uL (ref 0.7–4.0)
MCH: 28.5 pg (ref 26.0–34.0)
MCHC: 32.4 g/dL (ref 30.0–36.0)
MCV: 88 fL (ref 80.0–100.0)
Monocytes Absolute: 0.4 10*3/uL (ref 0.1–1.0)
Monocytes Relative: 7 %
Neutro Abs: 3.7 10*3/uL (ref 1.7–7.7)
Neutrophils Relative %: 62 %
Platelets: 307 10*3/uL (ref 150–400)
RBC: 5.65 MIL/uL (ref 4.22–5.81)
RDW: 15.5 % (ref 11.5–15.5)
WBC: 6 10*3/uL (ref 4.0–10.5)
nRBC: 0 % (ref 0.0–0.2)

## 2022-05-31 LAB — BASIC METABOLIC PANEL
Anion gap: 9 (ref 5–15)
BUN: 21 mg/dL — ABNORMAL HIGH (ref 6–20)
CO2: 25 mmol/L (ref 22–32)
Calcium: 9.6 mg/dL (ref 8.9–10.3)
Chloride: 101 mmol/L (ref 98–111)
Creatinine, Ser: 1.19 mg/dL (ref 0.61–1.24)
GFR, Estimated: >60 mL/min (ref >60)
Glucose, Bld: 254 mg/dL — ABNORMAL HIGH (ref 70–99)
Potassium: 5 mmol/L (ref 3.5–5.1)
Sodium: 135 mmol/L (ref 135–145)

## 2022-05-31 LAB — BRAIN NATRIURETIC PEPTIDE: B Natriuretic Peptide: 447 pg/mL — ABNORMAL HIGH (ref 0.0–100.0)

## 2022-05-31 LAB — TROPONIN I (HIGH SENSITIVITY)
Troponin I (High Sensitivity): 48 ng/L — ABNORMAL HIGH (ref <18)
Troponin I (High Sensitivity): 48 ng/L — ABNORMAL HIGH (ref <18)

## 2022-05-31 MED ORDER — FUROSEMIDE 10 MG/ML IJ SOLN
40.0000 mg | Freq: Once | INTRAMUSCULAR | Status: AC
  Administered 2022-05-31: 40 mg via INTRAVENOUS
  Filled 2022-05-31: qty 4

## 2022-05-31 NOTE — Telephone Encounter (Signed)
Pt c/o Shortness Of Breath: STAT if SOB developed within the last 24 hours or pt is noticeably SOB on the phone  1. Are you currently SOB (can you hear that pt is SOB on the phone)? No   2. How [place] have you been experiencing SOB? Past 3 days  3. Are you SOB when sitting or when up moving around? Moving around  4. Are you currently experiencing any other symptoms? No

## 2022-05-31 NOTE — Telephone Encounter (Signed)
    Per review of his last office note, he has a history of known HFrEF and is pending evaluation by Advanced Heart Failure.  Prior catheterization showed no significant CAD. He is listed as taking Lasix 20 mg as needed and given his dyspnea on exertion and orthopnea, would recommend that he take this for the next 3 to 4 days. Continue to follow daily weights and try to limit sodium intake. Keep fluid intake to ~ 2 L.    Signed, Erma Heritage, PA-C 05/31/2022, 5:11 PM

## 2022-05-31 NOTE — ED Triage Notes (Signed)
SOB x 1 wk. Denies CP, NV or fevers. Able to complete sentences in full. NAD during triage.

## 2022-05-31 NOTE — Telephone Encounter (Signed)
Spoke to pt who stated that he has been SOB for almost a week. Pt notices his SOB gets worse with activity and while laying on his back/side. Pt denies any swelling/edema, cp, N/V. Pt stated he is compliant w/ medications.    Please advise.

## 2022-06-01 MED ORDER — FUROSEMIDE 20 MG PO TABS: 20.0000 mg | ORAL_TABLET | Freq: Every day | ORAL | 1 refills | Status: DC

## 2022-06-01 NOTE — Discharge Instructions (Signed)
Take your Lasix daily for the next 5 days and weigh yourself daily as well.  When you follow-up with your physician next week ask about continuing to take the Lasix or to stop.

## 2022-06-01 NOTE — ED Notes (Signed)
Patient ambulated around the ED. Patient had an increase work of breathing with an SpO2 ranging from 85-96.

## 2022-06-01 NOTE — Telephone Encounter (Signed)
Patient notified and verbalized understanding. Patient will limit sodium intake, keep fluid around 2L/day, and take Lasix 20 mg tablets 3-4 days. Patient had no other questions or concerns at this time.

## 2022-06-01 NOTE — ED Provider Notes (Signed)
[facility] EMERGENCY DEPARTMENT Provider Note   CSN: [id number] Arrival date & time: 05/31/22  1746     History  Chief Complaint  Patient presents with   Shortness of Breath    Darryl Frye is a 61 y.o. male.  61 year old male who presents the ER today with dyspnea.  Patient states been going on for about a week.  He states he has a "weak heart".  States he is been diagnosed with A-fib in the past and had an ablation few months back and also has a pacemaker was placed about a year ago.  He states that over the last week is a progressively worsening dyspnea on exertion but the last couple nights he had orthopnea as well.  States he still urinate multiple times a day.  States he takes his medications as prescribed however his Lasix is as needed he states he has not taken any of that since this started.  Has not noticed any lower extremity swelling or abdominal distention.  No productive cough.  No fevers.  Does not smoke.  No chest pain or lightheadedness.  No nausea.   Shortness of Breath      Home Medications Prior to Admission medications   Medication Sig Start Date End Date Taking? Authorizing Provider  furosemide (LASIX) 20 MG tablet Take 1 tablet (20 mg total) by mouth daily. 06/01/22  Yes Latona Krichbaum, Corene Cornea, MD  apixaban (ELIQUIS) 5 MG TABS tablet Take 5 mg by mouth 2 (two) times daily. 05/23/20   [provider]  atorvastatin (LIPITOR) 20 MG tablet Take 20 mg by mouth daily. 02/22/22   [provider]  Cholecalciferol (VITAMIN D-3) 25 MCG (1000 UT) CAPS Take 1 capsule by mouth daily.    [provider]  insulin glargine (LANTUS) 100 UNIT/ML Solostar Pen Inject 36 Units into the skin daily. 04/10/21   [provider]  magnesium oxide (MAG-OX) 400 MG tablet Take 400 mg by mouth 2 (two) times daily.    [provider]  metFORMIN (GLUCOPHAGE) 1000 MG tablet Take 1,000 mg by mouth 2 (two) times daily with a meal. 04/10/21 04/10/22  [provider]  metoprolol succinate (TOPROL-XL) 25 MG 24 hr tablet Take by mouth. 04/10/21 04/10/22  [provider]  sacubitril-valsartan (ENTRESTO) 24-26 MG Take 1 tablet by mouth 2 (two) times daily. 12/21/21   Arnoldo Lenis, MD  sertraline (ZOLOFT) 50 MG tablet Take 50 mg by mouth daily.    [provider]  spironolactone (ALDACTONE) 25 MG tablet Take 1 tablet (25 mg total) by mouth daily. 12/21/21   Arnoldo Lenis, MD      Allergies    Patient has no known allergies.    Review of Systems   Review of Systems  Respiratory:  Positive for shortness of breath.     Physical Exam Updated Vital Signs BP 103/84   Pulse 67   Temp 98.5 F (36.9 C)   Resp (!) 21   Ht 5\' 11"  (1.803 m)   Wt 90.7 kg   SpO2 95%   BMI 27.89 kg/m  Physical Exam Vitals and nursing note reviewed.  Constitutional:      Appearance: He is well-developed.  HENT:     Head: Normocephalic and atraumatic.  Cardiovascular:     Rate and Rhythm: Normal rate.  Pulmonary:     Effort: Pulmonary effort is normal. No respiratory distress.     Breath sounds: Examination of the right-lower field reveals rales. Examination of the  left-lower field reveals rales. Rales present. No wheezing or rhonchi.  Chest:     Chest wall: No mass.  Abdominal:     General: There is no distension.  Musculoskeletal:        General: Normal range of motion.     Cervical back: Normal range of motion.     Right lower leg: No edema.     Left lower leg: No edema.  Skin:    General: Skin is warm and dry.  Neurological:     General: No focal deficit present.     Mental Status: He is alert.     ED Results / Procedures / Treatments   Labs (all labs ordered are listed, but only abnormal results are displayed) Labs Reviewed  BASIC METABOLIC PANEL - Abnormal; Notable for the following components:      Result Value   Glucose, Bld 254 (*)    BUN 21 (*)    All other components within normal limits  BRAIN NATRIURETIC  PEPTIDE - Abnormal; Notable for the following components:   B Natriuretic Peptide 447.0 (*)    All other components within normal limits  TROPONIN I (HIGH SENSITIVITY) - Abnormal; Notable for the following components:   Troponin I (High Sensitivity) 48 (*)    All other components within normal limits  TROPONIN I (HIGH SENSITIVITY) - Abnormal; Notable for the following components:   Troponin I (High Sensitivity) 48 (*)    All other components within normal limits  CBC WITH DIFFERENTIAL/PLATELET    EKG EKG Interpretation  Date/Time:  Wednesday May 31 2022 18:08:18 EDT Ventricular Rate:  74 PR Interval:  188 QRS Duration: 94 QT Interval:  396 QTC Calculation: 439 R Axis:   68 Text Interpretation: Sinus rhythm with Premature atrial complexes Anterior infarct (cited on or before 17-Oct-2021) Abnormal ECG When compared with ECG of 17-Oct-2021 07:57, Premature atrial complexes are now Present Questionable change in QRS axis Nonspecific T wave abnormality, improved in Lateral leads Confirmed by Merrily Pew [phone]) on 05/31/2022 11:43:33 PM  Radiology DG Chest 2 View  Result Date: 05/31/2022 CLINICAL DATA:  Shortness of breath for 1 week EXAM: CHEST - 2 VIEW COMPARISON:  10/14/2021 FINDINGS: Cardiac pacemaker. Heart size and pulmonary vascularity are normal. Linear fibrosis in the mid lungs. No airspace disease or consolidation. No pleural effusions. No pneumothorax. Mediastinal contours appear intact. Degenerative changes in the spine. IMPRESSION: No active cardiopulmonary disease. Electronically Signed   By: Lucienne Capers M.D.   On: 05/31/2022 19:03    Procedures Procedures    Medications Ordered in ED Medications  furosemide (LASIX) injection 40 mg (40 mg Intravenous Given 05/31/22 2328)    ED Course/ Medical Decision Making/ A&P                           Medical Decision Making Amount and/or Complexity of Data Reviewed Radiology: ordered.  Risk Prescription drug  management.   On my read the chest x-ray appears to have some interstitial edema no pleural effusions no consolidations.  His BNP is also elevated and with crackles in the lower lobes and not taking his Lasix suspect that his symptoms are more related to CHF exacerbation rather than pneumonia, PE or other etiologies.  We will start with a dose of Lasix and make sure he is feeling better.  His sats varied anywhere from 93 to 96% while I was in there without any respiratory distress I do not think he  is in need to be admitted for this but will likely need take his Lasix for few days at home.  Has follow-up with PCP next week who can recheck his symptoms and also has cardiology the following week.  Initially the patient ambulated and still get hypoxic and tachypneic however after another hour of continued diuresis from his initial dose he ambulated with sats staying above 96% with no tachypnea.  Patient states he feels much better and more similar to what he is used to.  He will continue daily Lasix at home next 5 days.  Has follow-up with his PCP to reevaluate the situation.  We will keep his weights until then.  Will return here for worsening symptoms.  Troponin slightly elevated I think is more related to the congestive heart disease than it is ACS.   Final Clinical Impression(s) / ED Diagnoses Final diagnoses:  Acute on chronic congestive heart failure, unspecified heart failure type ([place])    Rx / DC Orders ED Discharge Orders          Ordered    furosemide (LASIX) 20 MG tablet  Daily        06/01/22 0252              Diasia Henken, Corene Cornea, MD 06/01/22 [phone]

## 2022-06-01 NOTE — ED Notes (Signed)
Patient ambulated around the ED. Patient had no complaints of shortness of breath. SpO2 ranged between 94-97.

## 2022-06-06 ENCOUNTER — Encounter (HOSPITAL_COMMUNITY): Payer: Self-pay | Admitting: Cardiology

## 2022-06-06 ENCOUNTER — Ambulatory Visit (HOSPITAL_COMMUNITY)
Admission: RE | Admit: 2022-06-06 | Discharge: 2022-06-06 | Disposition: A | Discharge status: Home / Self Care | Payer: Medicare Other | Source: Ambulatory Visit | Attending: Cardiology | Admitting: Cardiology

## 2022-06-06 ENCOUNTER — Other Ambulatory Visit: Payer: Self-pay

## 2022-06-06 VITALS — BP 100/70 | HR 59 | Wt 203.0 lb

## 2022-06-06 DIAGNOSIS — Z7984 Long term (current) use of oral hypoglycemic drugs: Secondary | ICD-10-CM | POA: Diagnosis not present

## 2022-06-06 DIAGNOSIS — I4892 Unspecified atrial flutter: Secondary | ICD-10-CM

## 2022-06-06 DIAGNOSIS — Z86718 Personal history of other venous thrombosis and embolism: Secondary | ICD-10-CM | POA: Diagnosis not present

## 2022-06-06 DIAGNOSIS — I428 Other cardiomyopathies: Secondary | ICD-10-CM | POA: Diagnosis not present

## 2022-06-06 DIAGNOSIS — Z79899 Other long term (current) drug therapy: Secondary | ICD-10-CM | POA: Insufficient documentation

## 2022-06-06 DIAGNOSIS — Z7901 Long term (current) use of anticoagulants: Secondary | ICD-10-CM | POA: Diagnosis not present

## 2022-06-06 DIAGNOSIS — I5022 Chronic systolic (congestive) heart failure: Secondary | ICD-10-CM | POA: Diagnosis not present

## 2022-06-06 DIAGNOSIS — I4891 Unspecified atrial fibrillation: Secondary | ICD-10-CM | POA: Diagnosis not present

## 2022-06-06 DIAGNOSIS — R0602 Shortness of breath: Secondary | ICD-10-CM | POA: Diagnosis present

## 2022-06-06 LAB — CBC
HCT: 47.8 % (ref 39.0–52.0)
Hemoglobin: 15.7 g/dL (ref 13.0–17.0)
MCH: 28.2 pg (ref 26.0–34.0)
MCHC: 32.8 g/dL (ref 30.0–36.0)
MCV: 85.8 fL (ref 80.0–100.0)
Platelets: 313 10*3/uL (ref 150–400)
RBC: 5.57 MIL/uL (ref 4.22–5.81)
RDW: 15.2 % (ref 11.5–15.5)
WBC: 5.6 10*3/uL (ref 4.0–10.5)
nRBC: 0 % (ref 0.0–0.2)

## 2022-06-06 LAB — BASIC METABOLIC PANEL
Anion gap: 10 (ref 5–15)
BUN: 22 mg/dL — ABNORMAL HIGH (ref 6–20)
CO2: 24 mmol/L (ref 22–32)
Calcium: 10 mg/dL (ref 8.9–10.3)
Chloride: 103 mmol/L (ref 98–111)
Creatinine, Ser: 1.22 mg/dL (ref 0.61–1.24)
GFR, Estimated: >60 mL/min (ref >60)
Glucose, Bld: 180 mg/dL — ABNORMAL HIGH (ref 70–99)
Potassium: 4.5 mmol/L (ref 3.5–5.1)
Sodium: 137 mmol/L (ref 135–145)

## 2022-06-06 LAB — BRAIN NATRIURETIC PEPTIDE: B Natriuretic Peptide: 272.1 pg/mL — ABNORMAL HIGH (ref 0.0–100.0)

## 2022-06-06 MED ORDER — FUROSEMIDE 20 MG PO TABS: 40.0000 mg | ORAL_TABLET | Freq: Every day | ORAL | 3 refills | Status: DC

## 2022-06-06 NOTE — Progress Notes (Signed)
PCP: [name], [place] Cardiology: Dr. Harl Bowie HF Cardiology: Dr. Aundra Dubin  61 y.o. with history of atrial fibrillation/flutter and nonischemic cardiomyopathy was referred by Dr. Harl Bowie for evaluation of CHF.  Patient was diagnosed with CHF in 2020 at [facility] in [place].  At the time, he drank heavily and used cocaine.  He stopped drinking, smoking, and cocaine in 2020 and has not used any of these substances since then. Echo in 11/20 showed EF < 10%, cath in 12/20 showed normal coronaries.  Echo has shown persistently low EF over time, echo in 2/23 showed EF 20-25%.  He has a Medtronic ICD.    Patient was admitted in 2/23 with atrial flutter/RVR. He was started on amiodarone and converted to NSR.  He had atrial flutter ablation in 5/23 and amiodarone was stopped.   Patient was seen in the ER last week with dyspnea.  He was sent home.  He is short of breath walking longer distances and walking up stairs.  He has occasional episodes of PND and he is orthopneic at times.  He can do ADLs with no problems.  No chest pain. No lightheadedness. He denies palpitations and is in NSR today.   ECG (personally reviewed): NSR, PACs, LAE, nonspecific anterolateral TWIs  Labs (10/23): BNP 447, K 5, creatinine 1.19, HS-TnI 48  PMH: 1. Atrial fibrillation/flutter: Mainly flutter.  - 5/23 Atrial flutter ablation.  2. H/o DVT 3. Chronic systolic CHF: Nonischemic cardiomyopathy.  Medtronic ICD.  - Echo (11/20): EF < 10% - Cath (12/20): Normal coronaries.  - Echo (8/22): EF 30%, mildly decreased RV systolic function.  - Echo (2/23): EF 20-25%, normal RV.  4. Prior cocaine abuse.  5. Prior heavy ETOH  SH: Patient was a heavy drinker, abused cocaine, and smoked in the past but quit in 2020.  Lives in [place]. He does part-time custodial work.   FH: Mother with PPM, aunt with CHF.   ROS: All systems reviewed and negative except as per HPI.   Current Outpatient Medications  Medication Sig Dispense  Refill   apixaban (ELIQUIS) 5 MG TABS tablet Take 5 mg by mouth 2 (two) times daily.     atorvastatin (LIPITOR) 20 MG tablet Take 20 mg by mouth daily.     JARDIANCE 25 MG TABS tablet Take 25 mg by mouth daily.     magnesium oxide (MAG-OX) 400 MG tablet Take 400 mg by mouth 2 (two) times daily.     sacubitril-valsartan (ENTRESTO) 24-26 MG Take 1 tablet by mouth 2 (two) times daily. 180 tablet 2   sertraline (ZOLOFT) 50 MG tablet Take 50 mg by mouth daily.     spironolactone (ALDACTONE) 25 MG tablet Take 1 tablet (25 mg total) by mouth daily. 90 tablet 2   furosemide (LASIX) 20 MG tablet Take 2 tablets (40 mg total) by mouth daily. 180 tablet 3   insulin glargine (LANTUS) 100 UNIT/ML Solostar Pen Inject 36 Units into the skin daily. (Patient not taking: Reported on 06/06/2022)     metFORMIN (GLUCOPHAGE) 1000 MG tablet Take 1,000 mg by mouth 2 (two) times daily with a meal.     metoprolol succinate (TOPROL-XL) 25 MG 24 hr tablet Take by mouth.     No current facility-administered medications for this encounter.   BP 100/70   Pulse (!) 59   Wt 92.1 kg (203 lb)   SpO2 95%   BMI 28.31 kg/m  General: NAD Neck: JVP 8-9 cm, no thyromegaly or thyroid nodule.  Lungs: Clear to  auscultation bilaterally with normal respiratory effort. CV: Nondisplaced PMI.  Heart regular S1/S2, no S3/S4, no murmur.  No peripheral edema.  No carotid bruit.  Normal pedal pulses.  Abdomen: Soft, nontender, no hepatosplenomegaly, no distention.  Skin: Intact without lesions or rashes.  Neurologic: Alert and oriented x 3.  Psych: Normal affect. Extremities: No clubbing or cyanosis.  HEENT: Normal.   Assessment/Plan: 1. Chronic systolic CHF: Nonischemic cardiomyopathy, diagnosed 2020.  At the time, he drank heavily and used cocaine, so it is possible that this is a substance abuse-related cardiomyopathy though LV function has remained low even with stopping ETOH and cocaine.  Cath in 12/20 with no significant coronary  disease.  Medtronic ICD. Most recent echo in 2/23 showed EF 20-25% with normal RV.  NYHA class II-III symptoms.  He is mildly volume overloaded on exam.  - Narrow QRS, not CRT candidate.  - Increase Lasix to 40 mg daily with BMET/BNP today and BMET in 10 days.  - Continue Jardiance 25 mg daily.  - Continue Entresto 24/26 bid, will not increase today with soft BP.  - Continue Toprol XL 25 mg daily - Continue spironolactone 25 mg daily.  - I will arrange for cardiac MRI to assess for infiltrative disease.  - I will arrange for CPX.  Given low BP and persistent symptoms, I am concerned that he could be progressing towards low output HF.   - He may be a candidate for barostimulation activation therapy or cardiac contractility modulation in the future.  2. Atrial fibrillation/flutter: S/p flutter ablation.  He mainly had atrial flutter.  He is in NSR today.  - Continue apixaban.  3. H/o DVT: He is on apixaban.   Followup in 3 wks with HF pharmacist to see if we can increase Entresto. Followup 2 months with me.   Loralie Champagne 06/06/2022

## 2022-06-06 NOTE — Progress Notes (Signed)
ReDS Vest / Clip - 06/06/22 1200       ReDS Vest / Clip   Station Marker D    Ruler Value 33    ReDS Value Range Low volume    ReDS Actual Value 27

## 2022-06-06 NOTE — Patient Instructions (Signed)
Increase lasix to 40 mg daily.  Labs done today, your results will be available in MyChart, we will contact you for abnormal readings.  Repeat blood work in 10 days at [facility] has recommended that you have a cardiopulmonary stress test (CPX). CPX testing is a non-invasive measurement of heart and lung function. It replaces a traditional treadmill stress test. This type of test provides a tremendous amount of information that relates not only to your present condition but also for future outcomes. This test combines measurements of you ventilation, respiratory gas exchange in the lungs, electrocardiogram (EKG), blood pressure and physical response before, during, and following an exercise protocol. WE WILL CALL YOU TO ARRANGE THE TEST.  Your physician has requested that you have a cardiac MRI. Cardiac MRI uses a computer to create images of your heart as its beating, producing both still and moving pictures of your heart and major blood vessels. For further information please visit [email or web address] Please follow the instruction sheet given to you today for more information. ONCE YOUR INSURANCE APPROVES THE TEST YOU WILL BE CALLED TO ARRANGE THE TEST.  Please follow up with our heart failure pharmacist in 3 weeks  Your physician recommends that you schedule a follow-up appointment in: 2 months  If you have any questions or concerns before your next appointment please send [place] a message through [place] or call our office at [phone].    TO LEAVE A MESSAGE FOR THE NURSE SELECT OPTION 2, PLEASE LEAVE A MESSAGE INCLUDING: YOUR NAME DATE OF BIRTH CALL BACK NUMBER REASON FOR CALL**this is important as we prioritize the call backs  YOU WILL RECEIVE A CALL BACK THE SAME DAY AS [place] AS YOU CALL BEFORE 4:00 PM  At the [facility], you and your health needs are our priority. As part of our continuing mission to provide you with exceptional heart care, we have  created designated Provider Care Teams. These Care Teams include your primary Cardiologist (physician) and Advanced Practice Providers (APPs- Physician Assistants and Nurse Practitioners) who all work together to provide you with the care you need, when you need it.   You may see any of the following providers on your designated Care Team at your next follow up: Dr Glori Bickers Dr Loralie Champagne Dr. Roxana Hires, NP Lyda Jester, [place] [facility] [place], [place] Forestine Na, NP Audry Riles, PharmD   Please be sure to bring in all your medications bottles to every appointment.

## 2022-06-11 ENCOUNTER — Other Ambulatory Visit: Payer: Self-pay

## 2022-06-11 ENCOUNTER — Encounter (HOSPITAL_COMMUNITY): Payer: Self-pay

## 2022-06-11 ENCOUNTER — Emergency Department (HOSPITAL_COMMUNITY)
Admission: EM | Admit: 2022-06-11 | Discharge: 2022-06-11 | Disposition: A | Discharge status: Home / Self Care | Payer: Medicare Other | Attending: Emergency Medicine | Admitting: Emergency Medicine

## 2022-06-11 ENCOUNTER — Emergency Department (HOSPITAL_COMMUNITY): Payer: Medicare Other

## 2022-06-11 DIAGNOSIS — I5023 Acute on chronic systolic (congestive) heart failure: Secondary | ICD-10-CM | POA: Diagnosis not present

## 2022-06-11 DIAGNOSIS — Z87891 Personal history of nicotine dependence: Secondary | ICD-10-CM | POA: Insufficient documentation

## 2022-06-11 DIAGNOSIS — I2510 Atherosclerotic heart disease of native coronary artery without angina pectoris: Secondary | ICD-10-CM | POA: Diagnosis not present

## 2022-06-11 DIAGNOSIS — Z79899 Other long term (current) drug therapy: Secondary | ICD-10-CM | POA: Diagnosis not present

## 2022-06-11 DIAGNOSIS — E119 Type 2 diabetes mellitus without complications: Secondary | ICD-10-CM | POA: Diagnosis not present

## 2022-06-11 DIAGNOSIS — Z950 Presence of cardiac pacemaker: Secondary | ICD-10-CM | POA: Diagnosis not present

## 2022-06-11 DIAGNOSIS — Z7984 Long term (current) use of oral hypoglycemic drugs: Secondary | ICD-10-CM | POA: Diagnosis not present

## 2022-06-11 DIAGNOSIS — Z7901 Long term (current) use of anticoagulants: Secondary | ICD-10-CM | POA: Diagnosis not present

## 2022-06-11 DIAGNOSIS — R0602 Shortness of breath: Secondary | ICD-10-CM | POA: Diagnosis not present

## 2022-06-11 DIAGNOSIS — I110 Hypertensive heart disease with heart failure: Secondary | ICD-10-CM | POA: Insufficient documentation

## 2022-06-11 LAB — BASIC METABOLIC PANEL
Anion gap: 7 (ref 5–15)
BUN: 23 mg/dL — ABNORMAL HIGH (ref 6–20)
CO2: 26 mmol/L (ref 22–32)
Calcium: 9.4 mg/dL (ref 8.9–10.3)
Chloride: 102 mmol/L (ref 98–111)
Creatinine, Ser: 1.22 mg/dL (ref 0.61–1.24)
GFR, Estimated: >60 mL/min (ref >60)
Glucose, Bld: 174 mg/dL — ABNORMAL HIGH (ref 70–99)
Potassium: 4.4 mmol/L (ref 3.5–5.1)
Sodium: 135 mmol/L (ref 135–145)

## 2022-06-11 LAB — CBC
HCT: 46.9 % (ref 39.0–52.0)
Hemoglobin: 15.5 g/dL (ref 13.0–17.0)
MCH: 28.4 pg (ref 26.0–34.0)
MCHC: 33 g/dL (ref 30.0–36.0)
MCV: 86.1 fL (ref 80.0–100.0)
Platelets: 290 10*3/uL (ref 150–400)
RBC: 5.45 MIL/uL (ref 4.22–5.81)
RDW: 15.6 % — ABNORMAL HIGH (ref 11.5–15.5)
WBC: 6.5 10*3/uL (ref 4.0–10.5)
nRBC: 0 % (ref 0.0–0.2)

## 2022-06-11 LAB — BRAIN NATRIURETIC PEPTIDE: B Natriuretic Peptide: 414 pg/mL — ABNORMAL HIGH (ref 0.0–100.0)

## 2022-06-11 MED ORDER — FUROSEMIDE 10 MG/ML IJ SOLN
40.0000 mg | Freq: Once | INTRAMUSCULAR | Status: AC
  Administered 2022-06-11: 40 mg via INTRAVENOUS
  Filled 2022-06-11: qty 4

## 2022-06-11 NOTE — ED Triage Notes (Signed)
Pt reports Sob that started last night. Hx of HF

## 2022-06-11 NOTE — ED Provider Notes (Signed)
[facility] EMERGENCY DEPARTMENT Provider Note  CSN: [id number] Arrival date & time: 06/11/22 1606  Chief Complaint(s) Shortness of Breath  HPI Darryl Frye is a 61 y.o. male with history of a flutter on Eliquis, CHF, diabetes presenting to the emergency department shortness of breath.  He reports worsening shortness of breath over the past few days, worse last night.  Worse with orthopnea.  No cough, fevers, chills, chest pain, leg swelling, syncope, ICD shock.  He reports compliance with his Eliquis and his Lasix.  He reports compliance with increased Lasix dosage.   Past Medical History Past Medical History:  Diagnosis Date   Acute kidney injury ([place])    Acute respiratory failure (HCC)    Atrial flutter (HCC)    on Eliquis   CHF (congestive heart failure) ([place])    EF 20-25% 10/15/21, AICD in place.   Coronary artery disease    Diabetes mellitus without complication ([place])    DVT (deep venous thrombosis) ([place]) 06/2019   Heart murmur    Hypertension    Presence of permanent cardiac pacemaker    Renal disorder    Patient Active Problem List   Diagnosis Date Noted   History of colonic polyps 10/24/2021   Heart failure with reduced ejection fraction ([place]) 10/24/2021   Heartburn 10/24/2021   Atrial fibrillation with RVR ([place]) 10/14/2021   Anticoagulated by anticoagulation treatment 07/30/2021   BMI 29.0-29.9,adult 07/30/2021   Depression with anxiety 07/30/2021   Dizziness 07/30/2021   S/P ICD (internal cardiac defibrillator) procedure 07/30/2021   SOB (shortness of breath) 07/30/2021   COVID-19 04/09/2021   Essential hypertension 04/09/2021   ICD (implantable cardioverter-defibrillator) discharge 04/09/2021   Atrial flutter, paroxysmal ([place]) 04/08/2021   History of crack cocaine use 05/17/2020   History of smoking 25-50 pack years 05/17/2020   Uncontrolled type 2 diabetes mellitus with hyperglycemia, without [place]-term current use of insulin ([place]) 05/17/2020   NICM  (nonischemic cardiomyopathy) ([place]) 04/13/2020   Personal history of other medical treatment 03/29/2020   DOE (dyspnea on exertion) 12/12/2019   Acute respiratory failure ([place]) 09/28/2019   DM (diabetes mellitus), type 2 ([place]) 08/24/2019   DVT, bilateral lower limbs ([place]) 08/24/2019   ESRD (end stage renal disease) ([place]) 08/24/2019   Prolonged Q-T interval on ECG 08/24/2019   Lung nodule 08/22/2019   Chronic congestive heart failure ([place]) 49/70/2637   Chronic systolic CHF (congestive heart failure) ([place]) 08/01/2019   AKI (acute kidney injury) ([place]) 07/08/2019   Atrial fibrillation/flutter 07/08/2019   Paroxysmal atrial fibrillation ([place]) 07/08/2019   Cardiogenic shock ([place]) 07/08/2019   Shock liver 07/08/2019   S/P cardiac cath 08/21/2018   Home Medication(s) Prior to Admission medications   Medication Sig Start Date End Date Taking? Authorizing Provider  apixaban (ELIQUIS) 5 MG TABS tablet Take 5 mg by mouth 2 (two) times daily. 05/23/20   [provider]  atorvastatin (LIPITOR) 20 MG tablet Take 20 mg by mouth daily. 02/22/22   [provider]  furosemide (LASIX) 20 MG tablet Take 2 tablets (40 mg total) by mouth daily. 06/06/22   Larey Dresser, MD  insulin glargine (LANTUS) 100 UNIT/ML Solostar Pen Inject 36 Units into the skin daily. Patient not taking: Reported on 06/06/2022 04/10/21   [provider]  JARDIANCE 25 MG TABS tablet Take 25 mg by mouth daily. 05/18/22   [provider]  magnesium oxide (MAG-OX) 400 MG tablet Take 400 mg by mouth 2 (two) times daily.    [provider]  metFORMIN (GLUCOPHAGE) 1000 MG tablet Take 1,000 mg by mouth 2 (two) times daily with a meal. 04/10/21 04/10/22  [provider]  metoprolol succinate (TOPROL-XL) 25 MG 24 hr tablet Take by mouth. 04/10/21 04/10/22  [provider]  sacubitril-valsartan (ENTRESTO) 24-26 MG Take 1 tablet by mouth 2 (two) times daily. 12/21/21   Arnoldo Lenis, MD   sertraline (ZOLOFT) 50 MG tablet Take 50 mg by mouth daily.    [provider]  spironolactone (ALDACTONE) 25 MG tablet Take 1 tablet (25 mg total) by mouth daily. 12/21/21   Arnoldo Lenis, MD                                                                                                                                    Past Surgical History Past Surgical History:  Procedure Laterality Date   A-FLUTTER ABLATION N/A 01/12/2022   Procedure: A-FLUTTER ABLATION;  Surgeon: Evans Lance, MD;  Location: [place] [facility];  Service: Cardiovascular;  Laterality: N/A;   COLONOSCOPY     Dr. Posey Pronto; One 4-6 mm hyperplastic rectal polyp, mid left-sided diverticulosis.   CYST REMOVAL NECK     HERNIA REPAIR     TONSILLECTOMY     Family History Family History  Problem Relation Age of Onset   Hyperlipidemia Mother    Heart disease Mother        has PPM   Diabetes Mother    Hypertension Mother    Heart disease Maternal Grandmother    Prostate cancer Maternal Grandfather    Colon cancer Neg Hx    Colon polyps Neg Hx     Social History Social History   Tobacco Use   Smoking status: Former    Packs/day: 1.00    Years: 20.00    Total pack years: 20.00    Types: Cigarettes    Quit date: 07/07/2019    Years since quitting: 2.9   Smokeless tobacco: Never  Vaping Use   Vaping Use: Never used  Substance Use Topics   Alcohol use: Not Currently   Drug use: Not Currently    Types: "Crack" cocaine    Comment: last used over 2 years ago (10/24/21).   Allergies Patient has no known allergies.  Review of Systems Review of Systems  All other systems reviewed and are negative.   Physical Exam Vital Signs  I have reviewed the triage vital signs BP 102/82   Pulse 71   Temp 98.2 F (36.8 C) (Oral)   Resp 19   Ht 5\' 11"  (1.803 m)   Wt 89.8 kg   SpO2 93%   BMI 27.62 kg/m  Physical Exam Vitals and nursing note reviewed.  Constitutional:      General: He is not in acute  distress.    Appearance: Normal appearance.  HENT:     Mouth/Throat:     Mouth: Mucous membranes are moist.  Eyes:  Conjunctiva/sclera: Conjunctivae normal.  Cardiovascular:     Rate and Rhythm: Normal rate and regular rhythm.  Pulmonary:     Effort: Pulmonary effort is normal. No respiratory distress.     Breath sounds: Normal breath sounds.     Comments: Trace bibasilar crackles Abdominal:     General: Abdomen is flat.     Palpations: Abdomen is soft.     Tenderness: There is no abdominal tenderness.  Musculoskeletal:     Right lower leg: No edema.     Left lower leg: No edema.  Skin:    General: Skin is warm and dry.     Capillary Refill: Capillary refill takes less than 2 seconds.  Neurological:     Mental Status: He is alert and oriented to person, place, and time. Mental status is at baseline.  Psychiatric:        Mood and Affect: Mood normal.        Behavior: Behavior normal.     ED Results and Treatments Labs (all labs ordered are listed, but only abnormal results are displayed) Labs Reviewed  BASIC METABOLIC PANEL - Abnormal; Notable for the following components:      Result Value   Glucose, Bld 174 (*)    BUN 23 (*)    All other components within normal limits  CBC - Abnormal; Notable for the following components:   RDW 15.6 (*)    All other components within normal limits  BRAIN NATRIURETIC PEPTIDE - Abnormal; Notable for the following components:   B Natriuretic Peptide 414.0 (*)    All other components within normal limits                                                                                                                          Radiology DG Chest 2 View  Result Date: 06/11/2022 CLINICAL DATA:  zzzzzzzzzw 61 year old male presenting with shortness of breath. EXAM: CHEST - 2 VIEW COMPARISON:  May 31, 2022 FINDINGS: LEFT-sided dual lead pacer device remains in place. Trachea midline. Cardiomediastinal contours and hilar structures are  normal. No lobar level consolidative changes. Scarring in the LEFT chest similar to the prior exam. No signs of pleural effusion. No pneumothorax. On limited assessment there is no acute skeletal finding. IMPRESSION: No active cardiopulmonary disease. Electronically Signed   By: Zetta Bills M.D.   On: 06/11/2022 16:45    Pertinent labs & imaging results that were available during my care of the patient were reviewed by me and considered in my medical decision making (see MDM for details).  Medications Ordered in ED Medications  furosemide (LASIX) injection 40 mg (40 mg Intravenous Given 06/11/22 2222)  Procedures Procedures  (including critical care time)  Medical Decision Making / ED Course   MDM:  61 year old male presenting to the emergency department with shortness of breath.  Patient overall well-appearing, trace crackles.  No lower extremity edema.  x-ray clear.  Not hypoxic on room air.  Suspect mild worsening CHF.  BNP is mildly elevated compared to previously.  Patient received 40 mg of IV Lasix and urinated significant amount, reports feeling well and is able to lie flat without any recurrent symptoms.  No sign of wheezing to suggest COPD exacerbation.  No chest pain to suggest ACS and EKG not suggestive.  No sign of pneumonia or pneumothorax on chest x-ray.  Emphasized close follow-up with his outpatient cardiologist.  He recently had his Lasix dose increased, advised he may need further up titration if he has recurrent symptoms.      Additional history obtained: -Additional history obtained from friend -External records from outside source obtained and reviewed including: Chart review including previous notes, labs, imaging, consultation notes including cardiology note recently   Lab Tests: -I ordered, reviewed, and interpreted labs.    The pertinent results include:   Labs Reviewed  BASIC METABOLIC PANEL - Abnormal; Notable for the following components:      Result Value   Glucose, Bld 174 (*)    BUN 23 (*)    All other components within normal limits  CBC - Abnormal; Notable for the following components:   RDW 15.6 (*)    All other components within normal limits  BRAIN NATRIURETIC PEPTIDE - Abnormal; Notable for the following components:   B Natriuretic Peptide 414.0 (*)    All other components within normal limits    Notable for elevated BNP  EKG   EKG Interpretation  Date/Time:  Sunday June 11 2022 22:12:05 EDT Ventricular Rate:  60 PR Interval:  159 QRS Duration: 100 QT Interval:  444 QTC Calculation: 444 R Axis:   -40 Text Interpretation: Sinus rhythm Atrial premature complexes Inferior infarct, old Anterior infarct, old Lateral leads are also involved Baseline wander in lead(s) V3 Confirmed by Garnette Gunner [phone]) on 06/11/2022 11:31:10 PM         Imaging Studies ordered: I ordered imaging studies including CXR On my interpretation imaging demonstrates clear lungs I independently visualized and interpreted imaging. I agree with the radiologist interpretation   Medicines ordered and prescription drug management: Meds ordered this encounter  Medications   furosemide (LASIX) injection 40 mg    -I have reviewed the patients home medicines and have made adjustments as needed   Cardiac Monitoring: The patient was maintained on a cardiac monitor.  I personally viewed and interpreted the cardiac monitored which showed an underlying rhythm of: NSR  Social Determinants of Health:  Diagnosis or treatment significantly limited by social determinants of health: history of polysubstance abuse, now sober   Reevaluation: After the interventions noted above, I reevaluated the patient and found that they have improved  Co morbidities that complicate the patient evaluation  Past Medical  History:  Diagnosis Date   Acute kidney injury ([place])    Acute respiratory failure ([place])    Atrial flutter ([place])    on Eliquis   CHF (congestive heart failure) ([place])    EF 20-25% 10/15/21, AICD in place.   Coronary artery disease    Diabetes mellitus without complication ([place])    DVT (deep venous thrombosis) ([place]) 06/2019   Heart murmur    Hypertension    Presence of permanent  cardiac pacemaker    Renal disorder       Dispostion: Disposition decision including need for hospitalization was considered, and patient discharged from emergency department.    Final Clinical Impression(s) / ED Diagnoses Final diagnoses:  Acute on chronic systolic congestive heart failure ([place])     This chart was dictated using voice recognition software.  Despite best efforts to proofread,  errors can occur which can change the documentation meaning.    Cristie Hem, MD 06/11/22 [phone]

## 2022-06-11 NOTE — Discharge Instructions (Addendum)
We evaluated you for your difficulty breathing.  Your symptoms improved with additional dose of Lasix in the emergency department.  Please follow-up closely with your cardiologist and your primary doctor.  If you need to take an extra Lasix dose at home for increased shortness of breath, you can try this.  Please return if you experience severe shortness of breath, chest pain, fainting, nausea or vomiting, lightheadedness, or any other concerning symptoms.

## 2022-06-22 ENCOUNTER — Ambulatory Visit (HOSPITAL_COMMUNITY)
Admission: RE | Admit: 2022-06-22 | Discharge: 2022-06-22 | Disposition: A | Discharge status: Home / Self Care | Payer: Medicare Other | Source: Ambulatory Visit | Attending: Adult Health | Admitting: Adult Health

## 2022-06-22 ENCOUNTER — Encounter (HOSPITAL_COMMUNITY): Payer: Self-pay

## 2022-06-22 VITALS — BP 90/62 | HR 69 | Wt 203.8 lb

## 2022-06-22 DIAGNOSIS — I4892 Unspecified atrial flutter: Secondary | ICD-10-CM | POA: Diagnosis not present

## 2022-06-22 DIAGNOSIS — I480 Paroxysmal atrial fibrillation: Secondary | ICD-10-CM

## 2022-06-22 DIAGNOSIS — Z7901 Long term (current) use of anticoagulants: Secondary | ICD-10-CM | POA: Diagnosis not present

## 2022-06-22 DIAGNOSIS — I5022 Chronic systolic (congestive) heart failure: Secondary | ICD-10-CM | POA: Diagnosis not present

## 2022-06-22 DIAGNOSIS — I110 Hypertensive heart disease with heart failure: Secondary | ICD-10-CM | POA: Insufficient documentation

## 2022-06-22 DIAGNOSIS — Z7984 Long term (current) use of oral hypoglycemic drugs: Secondary | ICD-10-CM | POA: Diagnosis not present

## 2022-06-22 DIAGNOSIS — I4891 Unspecified atrial fibrillation: Secondary | ICD-10-CM | POA: Diagnosis not present

## 2022-06-22 DIAGNOSIS — Z79899 Other long term (current) drug therapy: Secondary | ICD-10-CM | POA: Insufficient documentation

## 2022-06-22 DIAGNOSIS — R5381 Other malaise: Secondary | ICD-10-CM | POA: Insufficient documentation

## 2022-06-22 DIAGNOSIS — I428 Other cardiomyopathies: Secondary | ICD-10-CM | POA: Insufficient documentation

## 2022-06-22 DIAGNOSIS — R0602 Shortness of breath: Secondary | ICD-10-CM | POA: Diagnosis present

## 2022-06-22 DIAGNOSIS — Z86718 Personal history of other venous thrombosis and embolism: Secondary | ICD-10-CM | POA: Diagnosis not present

## 2022-06-22 DIAGNOSIS — Z95810 Presence of automatic (implantable) cardiac defibrillator: Secondary | ICD-10-CM | POA: Insufficient documentation

## 2022-06-22 LAB — CBC
HCT: 47.1 % (ref 39.0–52.0)
Hemoglobin: 15.5 g/dL (ref 13.0–17.0)
MCH: 28.8 pg (ref 26.0–34.0)
MCHC: 32.9 g/dL (ref 30.0–36.0)
MCV: 87.5 fL (ref 80.0–100.0)
Platelets: 313 10*3/uL (ref 150–400)
RBC: 5.38 MIL/uL (ref 4.22–5.81)
RDW: 15.3 % (ref 11.5–15.5)
WBC: 6.3 10*3/uL (ref 4.0–10.5)
nRBC: 0 % (ref 0.0–0.2)

## 2022-06-22 LAB — BASIC METABOLIC PANEL
Anion gap: 10 (ref 5–15)
BUN: 25 mg/dL — ABNORMAL HIGH (ref 6–20)
CO2: 25 mmol/L (ref 22–32)
Calcium: 10 mg/dL (ref 8.9–10.3)
Chloride: 103 mmol/L (ref 98–111)
Creatinine, Ser: 1.27 mg/dL — ABNORMAL HIGH (ref 0.61–1.24)
GFR, Estimated: >60 mL/min (ref >60)
Glucose, Bld: 179 mg/dL — ABNORMAL HIGH (ref 70–99)
Potassium: 4.8 mmol/L (ref 3.5–5.1)
Sodium: 138 mmol/L (ref 135–145)

## 2022-06-22 LAB — TYPE AND SCREEN
ABO/RH(D): O POS
Antibody Screen: NEGATIVE

## 2022-06-22 LAB — BRAIN NATRIURETIC PEPTIDE: B Natriuretic Peptide: 495.4 pg/mL — ABNORMAL HIGH (ref 0.0–100.0)

## 2022-06-22 MED ORDER — METOPROLOL SUCCINATE ER 25 MG PO TB24: 12.5000 mg | ORAL_TABLET | Freq: Every day | ORAL | 5 refills | Status: DC

## 2022-06-22 NOTE — H&P (View-Only) (Signed)
PCP: Dr Burr Medico Primary HF Cardiologist: Dr Aundra Dubin  EP: Dr [name]   HPI: Darryl Frye is a 61 y.o. with history of atrial fibrillation/flutter and nonischemic cardiomyopathy was referred by Dr. Harl Bowie for evaluation of CHF.  Patient was diagnosed with CHF in 2020 at [facility] in [place].  At the time, he drank heavily and used cocaine.  He stopped drinking, smoking, and cocaine in 2020 and has not used any of these substances since then. Echo in 11/20 showed EF < 10%, cath in 12/20 showed normal coronaries.  Echo has shown persistently low EF over time, echo in 2/23 showed EF 20-25%.  He has a Medtronic ICD.     Patient was admitted in 2/23 with atrial flutter/RVR. He was started on amiodarone and converted to NSR.  He had atrial flutter ablation in 5/23 and amiodarone was stopped  Patient was seen in the ER 06/01/22  dyspnea.  Given IV lasix and sent home He was sent home.   Saw Dr Aundra Dubin 06/06/22. Lasix was increased to 40 mg daily. CPA and CMRI ordered.   He was seen in the ED 06/11/22 with increased shortness of breath. Given IV lasix and sent home the same day. Home lasix regimen was increased to 60 mg po daily.   Today he returns for HF follow up. Overall feeling fair. SOB walking up steps. SOB walking back to the clinic. Taking breaks after walking 20 seconds. Denies PND. +orthopnea. Appetite ok. No fever or chills. Weight at home 197-199  pounds. Taking all medications. Last used cocaine 3 years ago. Lives alone in an apartment in [place]. 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   PMH: 1. Atrial fibrillation/flutter: Mainly flutter.  - 5/23 Atrial flutter ablation.  2. H/o DVT 3. Chronic systolic CHF: Nonischemic cardiomyopathy.  Medtronic ICD.  - Echo (11/20): EF < 10% - Cath (12/20): Normal coronaries.  - Echo (8/22): EF 30%, mildly decreased RV systolic function.  - Echo (2/23): EF 20-25%, normal RV.  4. Prior cocaine abuse.  5. Prior heavy ETOH   SH:  Patient was a heavy drinker, abused cocaine, and smoked in the past but quit in 2020.  Lives in [place]. He does part-time custodial work.    FH: Mother with PPM, aunt with CHF.   ROS: All systems negative except as listed in HPI, PMH and Problem List.  SH:  Social History   Socioeconomic History   Marital status: Divorced    Spouse name: Not on file   Number of children: Not on file   Years of education: Not on file   Highest education level: Not on file  Occupational History   Not on file  Tobacco Use   Smoking status: Former    Packs/day: 1.00    Years: 20.00    Total pack years: 20.00    Types: Cigarettes    Quit date: 07/07/2019    Years since quitting: 2.9   Smokeless tobacco: Never  Vaping Use   Vaping Use: Never used  Substance and Sexual Activity   Alcohol use: Not Currently   Drug use: Not Currently    Types: "Crack" cocaine    Comment: last used over 2 years ago (10/24/21).   Sexual activity: Not on file  Other Topics Concern   Not on file  Social History Narrative   Not on file   Social Determinants of Health   Financial Resource Strain: Not on file  Food Insecurity: Not on file  Transportation Needs: Not on file  Physical Activity: Not on file  Stress: Not on file  Social Connections: Not on file  Intimate Partner Violence: Not on file    FH:  Family History  Problem Relation Age of Onset   Hyperlipidemia Mother    Heart disease Mother        has PPM   Diabetes Mother    Hypertension Mother    Heart disease Maternal Grandmother    Prostate cancer Maternal Grandfather    Colon cancer Neg Hx    Colon polyps Neg Hx     Past Medical History:  Diagnosis Date   Acute kidney injury ([place])    Acute respiratory failure ([place])    Atrial flutter ([place])    on Eliquis   CHF (congestive heart failure) ([place])    EF 20-25% 10/15/21, AICD in place.   Coronary artery disease    Diabetes mellitus without complication ([place])    DVT (deep venous  thrombosis) ([place]) 06/2019   Heart murmur    Hypertension    Presence of permanent cardiac pacemaker    Renal disorder     Current Outpatient Medications  Medication Sig Dispense Refill   apixaban (ELIQUIS) 5 MG TABS tablet Take 5 mg by mouth 2 (two) times daily.     atorvastatin (LIPITOR) 20 MG tablet Take 20 mg by mouth daily.     furosemide (LASIX) 20 MG tablet Take 2 tablets (40 mg total) by mouth daily. 180 tablet 3   insulin glargine (LANTUS) 100 UNIT/ML Solostar Pen Inject 40 Units into the skin daily.     JARDIANCE 25 MG TABS tablet Take 25 mg by mouth daily.     levothyroxine (SYNTHROID) 25 MCG tablet Take 25 mcg by mouth daily before breakfast.     magnesium oxide (MAG-OX) 400 MG tablet Take 400 mg by mouth 2 (two) times daily.     metFORMIN (GLUCOPHAGE) 1000 MG tablet Take 1,000 mg by mouth 2 (two) times daily with a meal.     metoprolol succinate (TOPROL-XL) 25 MG 24 hr tablet Take by mouth.     sacubitril-valsartan (ENTRESTO) 24-26 MG Take 1 tablet by mouth 2 (two) times daily. 180 tablet 2   sertraline (ZOLOFT) 50 MG tablet Take 50 mg by mouth daily.     spironolactone (ALDACTONE) 25 MG tablet Take 1 tablet (25 mg total) by mouth daily. 90 tablet 2   No current facility-administered medications for this encounter.    Vitals:   06/22/22 1403  BP: 90/62  Pulse: 69  SpO2: 96%  Weight: 92.4 kg (203 lb 12.8 oz)   Wt Readings from Last 3 Encounters:  06/22/22 92.4 kg (203 lb 12.8 oz)  06/11/22 89.8 kg (198 lb)  06/06/22 92.1 kg (203 lb)     PHYSICAL EXAM: General:   No resp difficulty HEENT: normal Neck: supple. JVP flat. Carotids 2+ bilaterally; no bruits. No lymphadenopathy or thryomegaly appreciated. Cor: PMI normal. Regular rate & rhythm. No rubs, gallops or murmurs. Lungs: clear Abdomen: soft, nontender, nondistended. No hepatosplenomegaly. No bruits or masses. Good bowel sounds. Extremities: no cyanosis, clubbing, rash, edema Neuro: alert & orientedx3,  cranial nerves grossly intact. Moves all 4 extremities w/o difficulty. Affect pleasant.  ECG: SR 71 with PACs   ASSESSMENT & PLAN: 1. Chronic systolic CHF: Nonischemic cardiomyopathy, diagnosed 2020.  At the time, he drank heavily and used cocaine, so it is possible that this is a substance abuse-related cardiomyopathy though LV function has remained low  even with stopping ETOH and cocaine.  Cath in 12/20 with no significant coronary disease.  Medtronic ICD. Most recent echo in 2/23 showed EF 20-25% with normal RV.   Narrow QRS, not CRT candidate.  Unable to obtain CMRI with Medtronic ICD.  Marland Kitchen NYHA III. Functional decline over the last month. Volume status ok. I think he needs RHC to further assess hemodynamics. I am concerned he may need advanced therapies.  - Continue lasix 60 mg daily and instructed to take an extra 40 mg of lasix for 3 pound weight gain.  - Hypotensive today. Will cut back Toprol XL to 12.5 mg daily and may need to stop based on cath results.  - Continue Jardiance 25 mg daily.  - Continue Entresto 24/26 bid, will not increase today with soft BP.  - Continue spironolactone 25 mg daily. - Check BMET/BNP   -Hold off on CPX.  - Refer to VAD Team for possible   2. Atrial fibrillation/flutter: S/p flutter ablation.  He mainly had atrial flutter.  In SR today.  Continue apixaban but hold the day before cath.    3. H/o DVT: He is on apixaban.   Refer to VAD Team to start work up.   RHC next week to assess hemodynamics. May require hospitalization based on results.  I have reviewed the risks, indications, and alternatives to cardiac catheterization and possible angioplasty/stenting with the patient. Risks include but are not limited to bleeding, infection, vascular injury, stroke, myocardial infection, arrhythmia, kidney injury, radiation-related injury in the case of prolonged fluoroscopy use, emergency cardiac surgery, and death. The patient understands the risks of serious  complication is low (<0%).  Check precath labs and blood type. Hold eliquis the night before cath.  Hold off on CPX.   Follow up in 2 weeks.  Darryl Buttler NP-C  3:10 PM

## 2022-06-22 NOTE — Addendum Note (Signed)
Encounter addended by: Doren Custard, RN on: 06/22/2022 3:55 PM  Actions taken: Clinical Note Signed

## 2022-06-22 NOTE — Progress Notes (Signed)
ReDS Vest / Clip - 06/22/22 1400       ReDS Vest / Clip   Station Marker C    Ruler Value 27    ReDS Value Range High volume overload    ReDS Actual Value 45

## 2022-06-22 NOTE — Progress Notes (Signed)
MCS EDUCATION NOTE:                VAD evaluation consent reviewed and signed by Darryl Frye . Initial VAD teaching completed with pt.  VAD educational packet including "Understanding Your Options with Advanced Heart Failure", "[place] Patient Agreement for VAD Evaluation and Potential Implantation" consent, and [employer] "Heartmate 3 Left Ventricular Device (LVAD) Patient Guide", Heartmate 3 Left Ventricular Assist System Patient Education Program DVD", "[place] HM III Patient Education", "[place] Mechanical Circulatory Support Program", and "Decision Aids for Left Ventricular Assist Device" reviewed in detail and left at bedside for continued reference.   All questions answered regarding VAD implant, hospital stay, and what to expect when discharged home living with a heart pump. Pt is going to talk to family about who would act as designated caregiver. He has support from his ex wife, daughter and sister in law.  Explained need for 24/7 care when pt is discharged home due to sternal precautions, adaptation to living on support, emotional support, consistent and meticulous exit site care and management, medication adherence and high volume of follow up visits with the [facility] after discharge; both pt and caregiver verbalized understanding of above.   Explained that LVAD can be implanted for two indications in the setting of advanced left ventricular heart failure treatment:  Bridge to transplant - used for patients who cannot safely wait for heart transplant without this device.  Or    Destination therapy - used for patients until end of life or recovery of heart function.  Patient acknowledge that the indication at this point in time for LVAD therapy would be for destination therapy due to uncontrolled diabetes with a hemoglobin A1C of 9.7.   Provided brief equipment overview and demonstration with HeartMate III training loop including discussion on the following:   a) mobile power  unit b) system controller   c) universal Charity fundraiser   d) battery clips   e) Batteries   f)  Perc lock   g) Percutaneous lead   Demonstrated and discussed:  a) changing power source on system controller from tethered (MPU) to untethered (battery) mode   b) changing power source on system controller from untethered (battery) to tethered (MPU) mode   c) how to monitor battery life both on the system controller and on each individual battery   d) changing batteries   Reviewed and supplied a copy of home inspection check list stressing that only three pronged grounded power outlets can be used for VAD equipment. Patient confirmed home has electrical outlets that will support the equipment along with access working telephone.  Identified the following lifestyle modifications while living on MCS:    1. No driving for at least three months and then only if doctor gives permission to do so.   2. No tub baths while pump implanted, and shower only when doctor gives permission.   3. No swimming or submersion in water while implanted with pump.   4. No contact sports or engaging in jumping activities.   5. Always have a backup controller, charged spare batteries, and battery clips nearby at all times in case of emergency.   6. Call the doctor or hospital contact person if any change in how the pump sounds, feels, or works.   7. Plan to sleep only when connected to the power module.   8. Do not sleep on your stomach.   9. Keep a backup system controller, charged batteries, battery clips, and  flashlight near you during sleep in case of electrical power outage.   10. Exit site care including dressing changes, monitoring for infection, and importance of keeping percutaneous lead stabilized at all times.    Extended the option to have one of our current patients and caregiver(s) come to talk with them about living on support to assist with decision making next week post RHC.   Reviewed pictures of  VAD drive line, site care, dressing changes, and drive line stabilization including securement attachment device and abdominal binder. Discussed with pt and family that they will be required to purchase dressing supplies as [name] as patient has the VAD in place.   Reinforced need for 24 hour/7 day week caregivers. He is going to talk to his family to determine caregiver if eligible. He will also need to abide by sternal precautions with no lifting >10lbs, pushing, pulling and will need assistance with adapting to new life style with VAD equipment and care.   Intermacs patient survival statistics through June 2023 reviewed with patient and caregiver as follows:    The patient understands that from this discussion it does not mean that they will receive the device, but that depends on an extensive evaluation process. The patient is aware of the fact that if at anytime they want to stop the evaluation process they can.  All questions have been answered at this time and contact information was provided should they encounter any further questions. They are both agreeable at this time to the evaluation process and will move forward. Will follow up post cath 06/27/22.    Darryl Morton, RN, BSN VAD Coordinator   Office: [phone] 24/7 [place] Pager: [phone]

## 2022-06-22 NOTE — Progress Notes (Addendum)
PCP: Dr Burr Medico Primary HF Cardiologist: Dr Aundra Dubin  EP: Dr [name]   HPI: [name] is a 61 y.o. with history of atrial fibrillation/flutter and nonischemic cardiomyopathy was referred by Dr. Harl Bowie for evaluation of CHF.  Patient was diagnosed with CHF in 2020 at [facility] in [place].  At the time, he drank heavily and used cocaine.  He stopped drinking, smoking, and cocaine in 2020 and has not used any of these substances since then. Echo in 11/20 showed EF < 10%, cath in 12/20 showed normal coronaries.  Echo has shown persistently low EF over time, echo in 2/23 showed EF 20-25%.  He has a Medtronic ICD.     Patient was admitted in 2/23 with atrial flutter/RVR. He was started on amiodarone and converted to NSR.  He had atrial flutter ablation in 5/23 and amiodarone was stopped  Patient was seen in the ER 06/01/22  dyspnea.  Given IV lasix and sent home He was sent home.   Saw Dr Aundra Dubin 06/06/22. Lasix was increased to 40 mg daily. CPA and CMRI ordered.   He was seen in the ED 06/11/22 with increased shortness of breath. Given IV lasix and sent home the same day. Home lasix regimen was increased to 60 mg po daily.   Today he returns for HF follow up. Overall feeling fair. SOB walking up steps. SOB walking back to the clinic. Taking breaks after walking 20 seconds. Denies PND. +orthopnea. Appetite ok. No fever or chills. Weight at home 197-199  pounds. Taking all medications. Last used cocaine 3 years ago. Lives alone in an apartment in [place]. 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   PMH: 1. Atrial fibrillation/flutter: Mainly flutter.  - 5/23 Atrial flutter ablation.  2. H/o DVT 3. Chronic systolic CHF: Nonischemic cardiomyopathy.  Medtronic ICD.  - Echo (11/20): EF < 10% - Cath (12/20): Normal coronaries.  - Echo (8/22): EF 30%, mildly decreased RV systolic function.  - Echo (2/23): EF 20-25%, normal RV.  4. Prior cocaine abuse.  5. Prior heavy ETOH   SH:  Patient was a heavy drinker, abused cocaine, and smoked in the past but quit in 2020.  Lives in [place]. He does part-time custodial work.    FH: Mother with PPM, aunt with CHF.   ROS: All systems negative except as listed in HPI, PMH and Problem List.  SH:  Social History   Socioeconomic History   Marital status: Divorced    Spouse name: Not on file   Number of children: Not on file   Years of education: Not on file   Highest education level: Not on file  Occupational History   Not on file  Tobacco Use   Smoking status: Former    Packs/day: 1.00    Years: 20.00    Total pack years: 20.00    Types: Cigarettes    Quit date: 07/07/2019    Years since quitting: 2.9   Smokeless tobacco: Never  Vaping Use   Vaping Use: Never used  Substance and Sexual Activity   Alcohol use: Not Currently   Drug use: Not Currently    Types: "Crack" cocaine    Comment: last used over 2 years ago (10/24/21).   Sexual activity: Not on file  Other Topics Concern   Not on file  Social History Narrative   Not on file   Social Determinants of Health   Financial Resource Strain: Not on file  Food Insecurity: Not on file  Transportation Needs: Not on file  Physical Activity: Not on file  Stress: Not on file  Social Connections: Not on file  Intimate Partner Violence: Not on file    FH:  Family History  Problem Relation Age of Onset   Hyperlipidemia Mother    Heart disease Mother        has PPM   Diabetes Mother    Hypertension Mother    Heart disease Maternal Grandmother    Prostate cancer Maternal Grandfather    Colon cancer Neg Hx    Colon polyps Neg Hx     Past Medical History:  Diagnosis Date   Acute kidney injury ([place])    Acute respiratory failure ([place])    Atrial flutter ([place])    on Eliquis   CHF (congestive heart failure) ([place])    EF 20-25% 10/15/21, AICD in place.   Coronary artery disease    Diabetes mellitus without complication ([employer])    DVT (deep venous  thrombosis) ([place]) 06/2019   Heart murmur    Hypertension    Presence of permanent cardiac pacemaker    Renal disorder     Current Outpatient Medications  Medication Sig Dispense Refill   apixaban (ELIQUIS) 5 MG TABS tablet Take 5 mg by mouth 2 (two) times daily.     atorvastatin (LIPITOR) 20 MG tablet Take 20 mg by mouth daily.     furosemide (LASIX) 20 MG tablet Take 2 tablets (40 mg total) by mouth daily. 180 tablet 3   insulin glargine (LANTUS) 100 UNIT/ML Solostar Pen Inject 40 Units into the skin daily.     JARDIANCE 25 MG TABS tablet Take 25 mg by mouth daily.     levothyroxine (SYNTHROID) 25 MCG tablet Take 25 mcg by mouth daily before breakfast.     magnesium oxide (MAG-OX) 400 MG tablet Take 400 mg by mouth 2 (two) times daily.     metFORMIN (GLUCOPHAGE) 1000 MG tablet Take 1,000 mg by mouth 2 (two) times daily with a meal.     metoprolol succinate (TOPROL-XL) 25 MG 24 hr tablet Take by mouth.     sacubitril-valsartan (ENTRESTO) 24-26 MG Take 1 tablet by mouth 2 (two) times daily. 180 tablet 2   sertraline (ZOLOFT) 50 MG tablet Take 50 mg by mouth daily.     spironolactone (ALDACTONE) 25 MG tablet Take 1 tablet (25 mg total) by mouth daily. 90 tablet 2   No current facility-administered medications for this encounter.    Vitals:   06/22/22 1403  BP: 90/62  Pulse: 69  SpO2: 96%  Weight: 92.4 kg (203 lb 12.8 oz)   Wt Readings from Last 3 Encounters:  06/22/22 92.4 kg (203 lb 12.8 oz)  06/11/22 89.8 kg (198 lb)  06/06/22 92.1 kg (203 lb)     PHYSICAL EXAM: General:   No resp difficulty HEENT: normal Neck: supple. JVP flat. Carotids 2+ bilaterally; no bruits. No lymphadenopathy or thryomegaly appreciated. Cor: PMI normal. Regular rate & rhythm. No rubs, gallops or murmurs. Lungs: clear Abdomen: soft, nontender, nondistended. No hepatosplenomegaly. No bruits or masses. Good bowel sounds. Extremities: no cyanosis, clubbing, rash, edema Neuro: alert & orientedx3,  cranial nerves grossly intact. Moves all 4 extremities w/o difficulty. Affect pleasant.  ECG: SR 71 with PACs   ASSESSMENT & PLAN: 1. Chronic systolic CHF: Nonischemic cardiomyopathy, diagnosed 2020.  At the time, he drank heavily and used cocaine, so it is possible that this is a substance abuse-related cardiomyopathy though LV function has remained low  even with stopping ETOH and cocaine.  Cath in 12/20 with no significant coronary disease.  Medtronic ICD. Most recent echo in 2/23 showed EF 20-25% with normal RV.   Narrow QRS, not CRT candidate.  Unable to obtain CMRI with Medtronic ICD.  Marland Kitchen NYHA III. Functional decline over the last month. Volume status ok. I think he needs RHC to further assess hemodynamics. I am concerned he may need advanced therapies.  - Continue lasix 60 mg daily and instructed to take an extra 40 mg of lasix for 3 pound weight gain.  - Hypotensive today. Will cut back Toprol XL to 12.5 mg daily and may need to stop based on cath results.  - Continue Jardiance 25 mg daily.  - Continue Entresto 24/26 bid, will not increase today with soft BP.  - Continue spironolactone 25 mg daily. - Check BMET/BNP   -Hold off on CPX.  - Refer to VAD Team for possible   2. Atrial fibrillation/flutter: S/p flutter ablation.  He mainly had atrial flutter.  In SR today.  Continue apixaban but hold the day before cath.    3. H/o DVT: He is on apixaban.   Refer to VAD Team to start work up.   RHC next week to assess hemodynamics. May require hospitalization based on results.  I have reviewed the risks, indications, and alternatives to cardiac catheterization and possible angioplasty/stenting with the patient. Risks include but are not limited to bleeding, infection, vascular injury, stroke, myocardial infection, arrhythmia, kidney injury, radiation-related injury in the case of prolonged fluoroscopy use, emergency cardiac surgery, and death. The patient understands the risks of serious  complication is low (<1%).  Check precath labs and blood type. Hold eliquis the night before cath.  Hold off on CPX.   Follow up in 2 weeks.  Eathan Groman NP-C  3:10 PM

## 2022-06-22 NOTE — Patient Instructions (Addendum)
EKG done today.  RedsClip done today.  Labs done today. We will contact you only if your labs are abnormal.  DECREASE Metoprolol to 12.5mg  (1/2 tablet) by mouth daily.  YOU MAY TAKE AN EXTRA 40MG  (2 TABLETS) OF LASIX FOR A WEIGHT GAIN OF 3 POUNDS OR MORE IN 24 HOURS   No other medication changes were made. Please continue all current medications as prescribed.  Your physician recommends that you schedule a follow-up appointment in: 2 weeks  If you have any questions or concerns before your next appointment please send [place] a message through [place] or call our office at [phone].    TO LEAVE A MESSAGE FOR THE NURSE SELECT OPTION 2, PLEASE LEAVE A MESSAGE INCLUDING: YOUR NAME DATE OF BIRTH CALL BACK NUMBER REASON FOR CALL**this is important as we prioritize the call backs  YOU WILL RECEIVE A CALL BACK THE SAME DAY AS [place] AS YOU CALL BEFORE 4:00 PM   Do the following things EVERYDAY: Weigh yourself in the morning before breakfast. Write it down and keep it in a log. Take your medicines as prescribed Eat low salt foods--Limit salt (sodium) to 2000 mg per day.  Stay as active as you can everyday Limit all fluids for the day to less than 2 liters   At the [facility], you and your health needs are our priority. As part of our continuing mission to provide you with exceptional heart care, we have created designated Provider Care Teams. These Care Teams include your primary Cardiologist (physician) and Advanced Practice Providers (APPs- Physician Assistants and Nurse Practitioners) who all work together to provide you with the care you need, when you need it.   You may see any of the following providers on your designated Care Team at your next follow up: Dr Glori Bickers Dr Haynes Kerns, NP Lyda Jester, [place] Audry Riles, PharmD   Please be sure to bring in all your medications bottles to every appointment.    You are scheduled for a  Cardiac Catheterization on Tuesday, November 7 with Dr. Loralie Champagne.  1. Please arrive at the [facility] (Main Entrance A) at [facility]: [address] [place], [place] [address] at 10:00 AM (This time is two hours before your procedure to ensure your preparation). Free valet parking service is available.   Special note: Every effort is made to have your procedure done on time. Please understand that emergencies sometimes delay scheduled procedures.  2. Diet: Do not eat solid foods after midnight.  The patient may have clear liquids until 5am upon the day of the procedure.  3. Medication instructions in preparation for your procedure:  Stop taking Eliquis (Apixiban) on Monday, November 6. DO NOT RESTART UNTIL THE DAY AFTER YOUR PROCEDURE.   DO NOT TAKE YOUR LASIX THE MORNING OF YOUR PROCEDURE.   DO NOT TAKE your insulin the morning of your procedure.   Do not take Diabetes Med Metformin and Jardiance  on the day of the procedure and HOLD 48 HOURS AFTER THE PROCEDURE.  On the morning of your procedure, take your  any morning medicines NOT listed above.  You may use sips of water.  4. Plan for one night stay--bring personal belongings. 5. Bring a current list of your medications and current insurance cards. 6. You MUST have a responsible person to drive you home. 7. Someone MUST be with you the first 24 hours after you arrive home or your discharge will be delayed.  8. Please wear clothes that are easy to get on and off and wear slip-on shoes.  Thank you for allowing [place] to care for you!   -- [facility]

## 2022-06-23 ENCOUNTER — Telehealth (HOSPITAL_COMMUNITY): Payer: Self-pay | Admitting: Cardiology

## 2022-06-26 ENCOUNTER — Inpatient Hospital Stay (HOSPITAL_COMMUNITY): Admission: RE | Admit: 2022-06-26 | Payer: Medicaid Other | Source: Ambulatory Visit

## 2022-06-27 ENCOUNTER — Encounter (HOSPITAL_COMMUNITY)
Admission: AD | Disposition: A | Discharge status: Rehabilitation Facility | Payer: Self-pay | Source: Home / Self Care | Attending: Cardiothoracic Surgery

## 2022-06-27 ENCOUNTER — Other Ambulatory Visit: Payer: Self-pay

## 2022-06-27 ENCOUNTER — Inpatient Hospital Stay: Payer: Self-pay

## 2022-06-27 ENCOUNTER — Inpatient Hospital Stay (HOSPITAL_COMMUNITY)
Admission: AD | Admit: 2022-06-27 | Discharge: 2022-07-21 | DRG: 001 | Disposition: A | Discharge status: Rehabilitation Facility | Payer: Medicare Other | Attending: Cardiothoracic Surgery | Admitting: Cardiothoracic Surgery

## 2022-06-27 DIAGNOSIS — E873 Alkalosis: Secondary | ICD-10-CM | POA: Diagnosis not present

## 2022-06-27 DIAGNOSIS — N189 Chronic kidney disease, unspecified: Secondary | ICD-10-CM | POA: Diagnosis not present

## 2022-06-27 DIAGNOSIS — I480 Paroxysmal atrial fibrillation: Secondary | ICD-10-CM | POA: Diagnosis present

## 2022-06-27 DIAGNOSIS — I509 Heart failure, unspecified: Secondary | ICD-10-CM | POA: Diagnosis present

## 2022-06-27 DIAGNOSIS — I5043 Acute on chronic combined systolic (congestive) and diastolic (congestive) heart failure: Secondary | ICD-10-CM | POA: Diagnosis not present

## 2022-06-27 DIAGNOSIS — N186 End stage renal disease: Secondary | ICD-10-CM | POA: Diagnosis present

## 2022-06-27 DIAGNOSIS — I472 Ventricular tachycardia, unspecified: Secondary | ICD-10-CM | POA: Diagnosis not present

## 2022-06-27 DIAGNOSIS — I5082 Biventricular heart failure: Secondary | ICD-10-CM | POA: Diagnosis present

## 2022-06-27 DIAGNOSIS — F32A Depression, unspecified: Secondary | ICD-10-CM | POA: Diagnosis present

## 2022-06-27 DIAGNOSIS — Z79899 Other long term (current) drug therapy: Secondary | ICD-10-CM

## 2022-06-27 DIAGNOSIS — N179 Acute kidney failure, unspecified: Secondary | ICD-10-CM | POA: Diagnosis not present

## 2022-06-27 DIAGNOSIS — I5084 End stage heart failure: Secondary | ICD-10-CM | POA: Diagnosis present

## 2022-06-27 DIAGNOSIS — I4892 Unspecified atrial flutter: Secondary | ICD-10-CM | POA: Diagnosis present

## 2022-06-27 DIAGNOSIS — J810 Acute pulmonary edema: Secondary | ICD-10-CM

## 2022-06-27 DIAGNOSIS — I255 Ischemic cardiomyopathy: Secondary | ICD-10-CM | POA: Diagnosis not present

## 2022-06-27 DIAGNOSIS — J9601 Acute respiratory failure with hypoxia: Secondary | ICD-10-CM | POA: Diagnosis present

## 2022-06-27 DIAGNOSIS — I272 Pulmonary hypertension, unspecified: Secondary | ICD-10-CM | POA: Diagnosis present

## 2022-06-27 DIAGNOSIS — Z9889 Other specified postprocedural states: Secondary | ICD-10-CM | POA: Diagnosis not present

## 2022-06-27 DIAGNOSIS — J9811 Atelectasis: Secondary | ICD-10-CM | POA: Diagnosis not present

## 2022-06-27 DIAGNOSIS — J690 Pneumonitis due to inhalation of food and vomit: Secondary | ICD-10-CM | POA: Diagnosis not present

## 2022-06-27 DIAGNOSIS — E039 Hypothyroidism, unspecified: Secondary | ICD-10-CM | POA: Diagnosis present

## 2022-06-27 DIAGNOSIS — R040 Epistaxis: Secondary | ICD-10-CM | POA: Diagnosis not present

## 2022-06-27 DIAGNOSIS — E876 Hypokalemia: Secondary | ICD-10-CM | POA: Diagnosis present

## 2022-06-27 DIAGNOSIS — Z515 Encounter for palliative care: Secondary | ICD-10-CM | POA: Diagnosis not present

## 2022-06-27 DIAGNOSIS — D649 Anemia, unspecified: Secondary | ICD-10-CM | POA: Diagnosis not present

## 2022-06-27 DIAGNOSIS — Z794 Long term (current) use of insulin: Secondary | ICD-10-CM | POA: Diagnosis not present

## 2022-06-27 DIAGNOSIS — I50811 Acute right heart failure: Secondary | ICD-10-CM

## 2022-06-27 DIAGNOSIS — I2510 Atherosclerotic heart disease of native coronary artery without angina pectoris: Secondary | ICD-10-CM | POA: Diagnosis present

## 2022-06-27 DIAGNOSIS — F4321 Adjustment disorder with depressed mood: Secondary | ICD-10-CM | POA: Diagnosis not present

## 2022-06-27 DIAGNOSIS — R5381 Other malaise: Secondary | ICD-10-CM | POA: Diagnosis not present

## 2022-06-27 DIAGNOSIS — I110 Hypertensive heart disease with heart failure: Secondary | ICD-10-CM | POA: Diagnosis present

## 2022-06-27 DIAGNOSIS — J189 Pneumonia, unspecified organism: Secondary | ICD-10-CM | POA: Diagnosis not present

## 2022-06-27 DIAGNOSIS — Z7984 Long term (current) use of oral hypoglycemic drugs: Secondary | ICD-10-CM

## 2022-06-27 DIAGNOSIS — Z7901 Long term (current) use of anticoagulants: Secondary | ICD-10-CM

## 2022-06-27 DIAGNOSIS — I130 Hypertensive heart and chronic kidney disease with heart failure and stage 1 through stage 4 chronic kidney disease, or unspecified chronic kidney disease: Secondary | ICD-10-CM | POA: Diagnosis not present

## 2022-06-27 DIAGNOSIS — I132 Hypertensive heart and chronic kidney disease with heart failure and with stage 5 chronic kidney disease, or end stage renal disease: Secondary | ICD-10-CM | POA: Diagnosis present

## 2022-06-27 DIAGNOSIS — I5021 Acute systolic (congestive) heart failure: Secondary | ICD-10-CM | POA: Diagnosis not present

## 2022-06-27 DIAGNOSIS — I4891 Unspecified atrial fibrillation: Secondary | ICD-10-CM | POA: Diagnosis not present

## 2022-06-27 DIAGNOSIS — Z95811 Presence of heart assist device: Secondary | ICD-10-CM

## 2022-06-27 DIAGNOSIS — Z86718 Personal history of other venous thrombosis and embolism: Secondary | ICD-10-CM

## 2022-06-27 DIAGNOSIS — I5022 Chronic systolic (congestive) heart failure: Secondary | ICD-10-CM | POA: Diagnosis not present

## 2022-06-27 DIAGNOSIS — I3139 Other pericardial effusion (noninflammatory): Secondary | ICD-10-CM | POA: Diagnosis not present

## 2022-06-27 DIAGNOSIS — Z23 Encounter for immunization: Secondary | ICD-10-CM | POA: Diagnosis present

## 2022-06-27 DIAGNOSIS — E1122 Type 2 diabetes mellitus with diabetic chronic kidney disease: Secondary | ICD-10-CM | POA: Diagnosis not present

## 2022-06-27 DIAGNOSIS — I361 Nonrheumatic tricuspid (valve) insufficiency: Secondary | ICD-10-CM | POA: Diagnosis not present

## 2022-06-27 DIAGNOSIS — E871 Hypo-osmolality and hyponatremia: Secondary | ICD-10-CM | POA: Diagnosis present

## 2022-06-27 DIAGNOSIS — I5023 Acute on chronic systolic (congestive) heart failure: Secondary | ICD-10-CM | POA: Diagnosis present

## 2022-06-27 DIAGNOSIS — I428 Other cardiomyopathies: Secondary | ICD-10-CM | POA: Diagnosis present

## 2022-06-27 DIAGNOSIS — R7989 Other specified abnormal findings of blood chemistry: Secondary | ICD-10-CM | POA: Diagnosis not present

## 2022-06-27 DIAGNOSIS — Z79890 Hormone replacement therapy (postmenopausal): Secondary | ICD-10-CM

## 2022-06-27 DIAGNOSIS — E785 Hyperlipidemia, unspecified: Secondary | ICD-10-CM | POA: Diagnosis present

## 2022-06-27 DIAGNOSIS — E119 Type 2 diabetes mellitus without complications: Secondary | ICD-10-CM | POA: Diagnosis present

## 2022-06-27 DIAGNOSIS — Z66 Do not resuscitate: Secondary | ICD-10-CM | POA: Diagnosis present

## 2022-06-27 DIAGNOSIS — I5020 Unspecified systolic (congestive) heart failure: Secondary | ICD-10-CM

## 2022-06-27 DIAGNOSIS — Z833 Family history of diabetes mellitus: Secondary | ICD-10-CM

## 2022-06-27 DIAGNOSIS — Z87891 Personal history of nicotine dependence: Secondary | ICD-10-CM

## 2022-06-27 DIAGNOSIS — Z8616 Personal history of COVID-19: Secondary | ICD-10-CM | POA: Diagnosis not present

## 2022-06-27 DIAGNOSIS — I493 Ventricular premature depolarization: Secondary | ICD-10-CM | POA: Diagnosis not present

## 2022-06-27 DIAGNOSIS — R570 Cardiogenic shock: Secondary | ICD-10-CM | POA: Diagnosis not present

## 2022-06-27 DIAGNOSIS — Z95810 Presence of automatic (implantable) cardiac defibrillator: Secondary | ICD-10-CM

## 2022-06-27 DIAGNOSIS — Z8249 Family history of ischemic heart disease and other diseases of the circulatory system: Secondary | ICD-10-CM

## 2022-06-27 DIAGNOSIS — Z7189 Other specified counseling: Secondary | ICD-10-CM | POA: Diagnosis not present

## 2022-06-27 HISTORY — PX: RIGHT HEART CATH: CATH118263

## 2022-06-27 LAB — POCT I-STAT EG7
Acid-Base Excess: 2 mmol/L (ref 0.0–2.0)
Acid-Base Excess: 1 mmol/L (ref 0.0–2.0)
Bicarbonate: 27.4 mmol/L (ref 20.0–28.0)
Bicarbonate: 26.1 mmol/L (ref 20.0–28.0)
Calcium, Ion: 1.24 mmol/L (ref 1.15–1.40)
Calcium, Ion: 1.24 mmol/L (ref 1.15–1.40)
HCT: 48 % (ref 39.0–52.0)
HCT: 48 % (ref 39.0–52.0)
Hemoglobin: 16.3 g/dL (ref 13.0–17.0)
Hemoglobin: 16.3 g/dL (ref 13.0–17.0)
O2 Saturation: 55 %
O2 Saturation: 53 %
Potassium: 4.3 mmol/L (ref 3.5–5.1)
Potassium: 4.2 mmol/L (ref 3.5–5.1)
Sodium: 138 mmol/L (ref 135–145)
Sodium: 137 mmol/L (ref 135–145)
TCO2: 29 mmol/L (ref 22–32)
TCO2: 27 mmol/L (ref 22–32)
pCO2, Ven: 42.4 mmHg — ABNORMAL LOW (ref 44–60)
pCO2, Ven: 42.6 mmHg — ABNORMAL LOW (ref 44–60)
pH, Ven: 7.418 (ref 7.25–7.43)
pH, Ven: 7.396 (ref 7.25–7.43)
pO2, Ven: 29 mmHg — CL (ref 32–45)
pO2, Ven: 28 mmHg — CL (ref 32–45)

## 2022-06-27 LAB — GLUCOSE, CAPILLARY
Glucose-Capillary: 187 mg/dL — ABNORMAL HIGH (ref 70–99)
Glucose-Capillary: 114 mg/dL — ABNORMAL HIGH (ref 70–99)
Glucose-Capillary: 90 mg/dL (ref 70–99)
Glucose-Capillary: 254 mg/dL — ABNORMAL HIGH (ref 70–99)

## 2022-06-27 LAB — COMPREHENSIVE METABOLIC PANEL
ALT: 19 U/L (ref 0–44)
AST: 20 U/L (ref 15–41)
Albumin: 3.8 g/dL (ref 3.5–5.0)
Alkaline Phosphatase: 117 U/L (ref 38–126)
Anion gap: 11 (ref 5–15)
BUN: 16 mg/dL (ref 6–20)
CO2: 25 mmol/L (ref 22–32)
Calcium: 9.3 mg/dL (ref 8.9–10.3)
Chloride: 102 mmol/L (ref 98–111)
Creatinine, Ser: 1.02 mg/dL (ref 0.61–1.24)
GFR, Estimated: >60 mL/min (ref >60)
Glucose, Bld: 119 mg/dL — ABNORMAL HIGH (ref 70–99)
Potassium: 4.5 mmol/L (ref 3.5–5.1)
Sodium: 138 mmol/L (ref 135–145)
Total Bilirubin: 0.7 mg/dL (ref 0.3–1.2)
Total Protein: 7.1 g/dL (ref 6.5–8.1)

## 2022-06-27 LAB — HEMOGLOBIN A1C
Hgb A1c MFr Bld: 9.3 % — ABNORMAL HIGH (ref 4.8–5.6)
Mean Plasma Glucose: 220.21 mg/dL

## 2022-06-27 LAB — TSH: TSH: 2.576 u[IU]/mL (ref 0.350–4.500)

## 2022-06-27 LAB — CBC
HCT: 47.5 % (ref 39.0–52.0)
Hemoglobin: 15.4 g/dL (ref 13.0–17.0)
MCH: 28.2 pg (ref 26.0–34.0)
MCHC: 32.4 g/dL (ref 30.0–36.0)
MCV: 87 fL (ref 80.0–100.0)
Platelets: 275 10*3/uL (ref 150–400)
RBC: 5.46 MIL/uL (ref 4.22–5.81)
RDW: 15.6 % — ABNORMAL HIGH (ref 11.5–15.5)
WBC: 6.6 10*3/uL (ref 4.0–10.5)
nRBC: 0 % (ref 0.0–0.2)

## 2022-06-27 LAB — COOXEMETRY PANEL
Carboxyhemoglobin: 1.4 % (ref 0.5–1.5)
Methemoglobin: 1.2 % (ref 0.0–1.5)
O2 Saturation: 67.9 %
Total hemoglobin: 15.8 g/dL (ref 12.0–16.0)

## 2022-06-27 LAB — LIPID PANEL
Cholesterol: 212 mg/dL — ABNORMAL HIGH (ref 0–200)
HDL: 55 mg/dL (ref >40)
LDL Cholesterol: 130 mg/dL — ABNORMAL HIGH (ref 0–99)
Total CHOL/HDL Ratio: 3.9 RATIO
Triglycerides: 134 mg/dL (ref <150)
VLDL: 27 mg/dL (ref 0–40)

## 2022-06-27 LAB — BRAIN NATRIURETIC PEPTIDE: B Natriuretic Peptide: 333.8 pg/mL — ABNORMAL HIGH (ref 0.0–100.0)

## 2022-06-27 LAB — ANTITHROMBIN III: AntiThromb III Func: 100 % (ref 75–120)

## 2022-06-27 LAB — SURGICAL PCR SCREEN
MRSA, PCR: POSITIVE — AB
Staphylococcus aureus: POSITIVE — AB

## 2022-06-27 LAB — HIV ANTIBODY (ROUTINE TESTING W REFLEX): HIV Screen 4th Generation wRfx: NONREACTIVE

## 2022-06-27 LAB — LACTATE DEHYDROGENASE: LDH: 200 U/L — ABNORMAL HIGH (ref 98–192)

## 2022-06-27 LAB — RAPID URINE DRUG SCREEN, HOSP PERFORMED
Amphetamines: NOT DETECTED
Barbiturates: NOT DETECTED
Benzodiazepines: NOT DETECTED
Cocaine: NOT DETECTED
Opiates: NOT DETECTED
Tetrahydrocannabinol: NOT DETECTED

## 2022-06-27 LAB — PROTIME-INR
INR: 1 (ref 0.8–1.2)
Prothrombin Time: 13 seconds (ref 11.4–15.2)

## 2022-06-27 LAB — PSA: Prostatic Specific Antigen: 1.33 ng/mL (ref 0.00–4.00)

## 2022-06-27 LAB — HEPATITIS B SURFACE ANTIGEN: Hepatitis B Surface Ag: NONREACTIVE

## 2022-06-27 LAB — PREALBUMIN: Prealbumin: 23 mg/dL (ref 18–38)

## 2022-06-27 LAB — APTT: aPTT: 33 seconds (ref 24–36)

## 2022-06-27 LAB — HEPATITIS C ANTIBODY: HCV Ab: NONREACTIVE

## 2022-06-27 LAB — HEPATITIS B SURFACE ANTIBODY,QUALITATIVE: Hep B S Ab: NONREACTIVE

## 2022-06-27 LAB — URIC ACID: Uric Acid, Serum: 6.4 mg/dL (ref 3.7–8.6)

## 2022-06-27 LAB — MAGNESIUM: Magnesium: 2 mg/dL (ref 1.7–2.4)

## 2022-06-27 LAB — T4, FREE: Free T4: 1.06 ng/dL (ref 0.61–1.12)

## 2022-06-27 LAB — HEPATITIS B CORE ANTIBODY, TOTAL: Hep B Core Total Ab: NONREACTIVE

## 2022-06-27 SURGERY — RIGHT HEART CATH
Anesthesia: LOCAL

## 2022-06-27 MED ORDER — ACETAMINOPHEN 325 MG PO TABS: 650.0000 mg | ORAL_TABLET | ORAL | Status: DC | PRN

## 2022-06-27 MED ORDER — HYDRALAZINE HCL 20 MG/ML IJ SOLN: 10.0000 mg | INTRAMUSCULAR | Status: AC | PRN

## 2022-06-27 MED ORDER — INSULIN GLARGINE-YFGN 100 UNIT/ML ~~LOC~~ SOLN
30.0000 [IU] | Freq: Every day | SUBCUTANEOUS | Status: DC
  Administered 2022-06-27 – 2022-07-07 (×10): 30 [IU] via SUBCUTANEOUS
  Filled 2022-06-27 (×11): qty 0.3

## 2022-06-27 MED ORDER — MILRINONE LACTATE IN DEXTROSE 20-5 MG/100ML-% IV SOLN
0.3750 ug/kg/min | INTRAVENOUS | Status: DC
  Administered 2022-06-27 – 2022-06-28 (×3): 0.25 ug/kg/min via INTRAVENOUS
  Administered 2022-06-29 – 2022-07-03 (×10): 0.375 ug/kg/min via INTRAVENOUS
  Filled 2022-06-27 (×13): qty 100

## 2022-06-27 MED ORDER — SODIUM CHLORIDE 0.9 % IV SOLN: INTRAVENOUS | Status: DC

## 2022-06-27 MED ORDER — APIXABAN 5 MG PO TABS
5.0000 mg | ORAL_TABLET | Freq: Two times a day (BID) | ORAL | Status: AC
  Administered 2022-06-27 – 2022-06-28 (×2): 5 mg via ORAL
  Filled 2022-06-27 (×2): qty 1

## 2022-06-27 MED ORDER — CHLORHEXIDINE GLUCONATE CLOTH 2 % EX PADS
6.0000 | MEDICATED_PAD | Freq: Every day | CUTANEOUS | Status: DC
  Administered 2022-06-27 – 2022-07-21 (×27): 6 via TOPICAL

## 2022-06-27 MED ORDER — MAGNESIUM OXIDE -MG SUPPLEMENT 400 (240 MG) MG PO TABS
400.0000 mg | ORAL_TABLET | Freq: Two times a day (BID) | ORAL | Status: DC
  Administered 2022-06-27 – 2022-07-21 (×47): 400 mg via ORAL
  Filled 2022-06-27 (×47): qty 1

## 2022-06-27 MED ORDER — ONDANSETRON HCL 4 MG/2ML IJ SOLN: 4.0000 mg | Freq: Four times a day (QID) | INTRAMUSCULAR | Status: DC | PRN

## 2022-06-27 MED ORDER — MAGNESIUM OXIDE 400 MG PO TABS
400.0000 mg | ORAL_TABLET | Freq: Two times a day (BID) | ORAL | Status: DC
  Filled 2022-06-27: qty 1

## 2022-06-27 MED ORDER — SPIRONOLACTONE 25 MG PO TABS
25.0000 mg | ORAL_TABLET | Freq: Every day | ORAL | Status: DC
  Administered 2022-06-28 – 2022-07-03 (×6): 25 mg via ORAL
  Filled 2022-06-27 (×6): qty 1

## 2022-06-27 MED ORDER — SODIUM CHLORIDE 0.9% FLUSH: 3.0000 mL | INTRAVENOUS | Status: DC | PRN

## 2022-06-27 MED ORDER — SERTRALINE HCL 25 MG PO TABS
50.0000 mg | ORAL_TABLET | Freq: Every day | ORAL | Status: DC
  Administered 2022-06-28 – 2022-07-21 (×23): 50 mg via ORAL
  Filled 2022-06-27: qty 2
  Filled 2022-06-27 (×3): qty 1
  Filled 2022-06-27 (×2): qty 2
  Filled 2022-06-27: qty 1
  Filled 2022-06-27: qty 2
  Filled 2022-06-27 (×7): qty 1
  Filled 2022-06-27 (×3): qty 2
  Filled 2022-06-27 (×3): qty 1
  Filled 2022-06-27: qty 2
  Filled 2022-06-27: qty 1

## 2022-06-27 MED ORDER — SODIUM CHLORIDE 0.9 % IV SOLN: 250.0000 mL | INTRAVENOUS | Status: DC | PRN

## 2022-06-27 MED ORDER — FUROSEMIDE 10 MG/ML IJ SOLN
80.0000 mg | Freq: Once | INTRAMUSCULAR | Status: AC
  Administered 2022-06-27: 80 mg via INTRAVENOUS
  Filled 2022-06-27: qty 8

## 2022-06-27 MED ORDER — FUROSEMIDE 10 MG/ML IJ SOLN
12.0000 mg/h | INTRAVENOUS | Status: DC
  Administered 2022-06-27 – 2022-06-28 (×2): 12 mg/h via INTRAVENOUS
  Filled 2022-06-27 (×2): qty 20

## 2022-06-27 MED ORDER — INSULIN ASPART 100 UNIT/ML IJ SOLN
0.0000 [IU] | Freq: Three times a day (TID) | INTRAMUSCULAR | Status: DC
  Administered 2022-06-28 (×2): 3 [IU] via SUBCUTANEOUS
  Administered 2022-06-28: 5 [IU] via SUBCUTANEOUS
  Administered 2022-06-29: 3 [IU] via SUBCUTANEOUS
  Administered 2022-06-29: 5 [IU] via SUBCUTANEOUS
  Administered 2022-06-30 (×2): 2 [IU] via SUBCUTANEOUS
  Administered 2022-06-30: 5 [IU] via SUBCUTANEOUS
  Administered 2022-07-01 – 2022-07-03 (×6): 3 [IU] via SUBCUTANEOUS

## 2022-06-27 MED ORDER — SODIUM CHLORIDE 0.9% FLUSH: 10.0000 mL | INTRAVENOUS | Status: DC | PRN

## 2022-06-27 MED ORDER — SACUBITRIL-VALSARTAN 24-26 MG PO TABS
1.0000 | ORAL_TABLET | Freq: Two times a day (BID) | ORAL | Status: DC
  Administered 2022-06-27 – 2022-06-28 (×3): 1 via ORAL
  Filled 2022-06-27 (×3): qty 1

## 2022-06-27 MED ORDER — LEVOTHYROXINE SODIUM 25 MCG PO TABS
25.0000 ug | ORAL_TABLET | Freq: Every day | ORAL | Status: DC
  Administered 2022-06-28 – 2022-07-21 (×23): 25 ug via ORAL
  Filled 2022-06-27 (×23): qty 1

## 2022-06-27 MED ORDER — CHLORHEXIDINE GLUCONATE CLOTH 2 % EX PADS
6.0000 | MEDICATED_PAD | Freq: Every day | CUTANEOUS | Status: DC
  Administered 2022-06-28 – 2022-06-29 (×2): 6 via TOPICAL

## 2022-06-27 MED ORDER — INSULIN ASPART 100 UNIT/ML IJ SOLN
0.0000 [IU] | Freq: Every day | INTRAMUSCULAR | Status: DC
  Administered 2022-06-27: 3 [IU] via SUBCUTANEOUS
  Administered 2022-06-28: 2 [IU] via SUBCUTANEOUS
  Administered 2022-06-29: 4 [IU] via SUBCUTANEOUS
  Administered 2022-06-30 – 2022-07-01 (×2): 2 [IU] via SUBCUTANEOUS
  Administered 2022-07-02: 4 [IU] via SUBCUTANEOUS
  Administered 2022-07-03: 2 [IU] via SUBCUTANEOUS

## 2022-06-27 MED ORDER — EMPAGLIFLOZIN 25 MG PO TABS
25.0000 mg | ORAL_TABLET | Freq: Every day | ORAL | Status: DC
  Administered 2022-06-27 – 2022-07-05 (×8): 25 mg via ORAL
  Filled 2022-06-27 (×10): qty 1

## 2022-06-27 MED ORDER — DIGOXIN 125 MCG PO TABS
0.1250 mg | ORAL_TABLET | Freq: Every day | ORAL | Status: DC
  Administered 2022-06-27 – 2022-07-21 (×24): 0.125 mg via ORAL
  Filled 2022-06-27 (×24): qty 1

## 2022-06-27 MED ORDER — SODIUM CHLORIDE 0.9% FLUSH: 3.0000 mL | Freq: Two times a day (BID) | INTRAVENOUS | Status: DC

## 2022-06-27 MED ORDER — AMIODARONE HCL IN DEXTROSE 360-4.14 MG/200ML-% IV SOLN
30.0000 mg/h | INTRAVENOUS | Status: DC
  Administered 2022-06-27 – 2022-07-06 (×18): 30 mg/h via INTRAVENOUS
  Administered 2022-07-07: 60 mg/h via INTRAVENOUS
  Administered 2022-07-07: 30 mg/h via INTRAVENOUS
  Administered 2022-07-07: 60 mg/h via INTRAVENOUS
  Administered 2022-07-07: 30 mg/h via INTRAVENOUS
  Administered 2022-07-07 – 2022-07-09 (×7): 60 mg/h via INTRAVENOUS
  Administered 2022-07-09 – 2022-07-12 (×6): 30 mg/h via INTRAVENOUS
  Filled 2022-06-27 (×29): qty 200
  Filled 2022-06-27: qty 400
  Filled 2022-06-27 (×3): qty 200
  Filled 2022-06-27: qty 400
  Filled 2022-06-27 (×2): qty 200

## 2022-06-27 MED ORDER — SODIUM CHLORIDE 0.9% FLUSH
10.0000 mL | Freq: Two times a day (BID) | INTRAVENOUS | Status: DC
  Administered 2022-06-28 – 2022-07-01 (×5): 10 mL
  Administered 2022-07-03: 30 mL
  Administered 2022-07-03: 10 mL
  Administered 2022-07-04 – 2022-07-06 (×2): 20 mL
  Administered 2022-07-09 – 2022-07-21 (×18): 10 mL

## 2022-06-27 MED ORDER — MUPIROCIN 2 % EX OINT
1.0000 | TOPICAL_OINTMENT | Freq: Two times a day (BID) | CUTANEOUS | Status: AC
  Administered 2022-06-27 – 2022-07-01 (×9): 1 via NASAL
  Filled 2022-06-27: qty 22

## 2022-06-27 MED ORDER — ATORVASTATIN CALCIUM 10 MG PO TABS
20.0000 mg | ORAL_TABLET | Freq: Every day | ORAL | Status: DC
  Administered 2022-06-28 – 2022-07-21 (×23): 20 mg via ORAL
  Filled 2022-06-27 (×24): qty 2

## 2022-06-27 MED ORDER — SODIUM CHLORIDE 0.9% FLUSH
3.0000 mL | INTRAVENOUS | Status: DC | PRN
  Administered 2022-06-27 (×2): 3 mL via INTRAVENOUS

## 2022-06-27 MED ORDER — HEPARIN (PORCINE) IN NACL 1000-0.9 UT/500ML-% IV SOLN
INTRAVENOUS | Status: DC | PRN
  Administered 2022-06-27 (×2): 500 mL

## 2022-06-27 MED ORDER — LIDOCAINE HCL (PF) 1 % IJ SOLN
INTRAMUSCULAR | Status: DC | PRN
  Administered 2022-06-27 (×2): 2 mL

## 2022-06-27 MED ORDER — LABETALOL HCL 5 MG/ML IV SOLN: 10.0000 mg | INTRAVENOUS | Status: AC | PRN

## 2022-06-27 SURGICAL SUPPLY — 6 items
CATH SWAN GANZ 7F STRAIGHT (CATHETERS) IMPLANT
GLIDESHEATH SLENDER 7FR .021G (SHEATH) IMPLANT
PACK CARDIAC CATHETERIZATION (CUSTOM PROCEDURE TRAY) ×1 IMPLANT
SHEATH PROBE COVER 6X72 (BAG) IMPLANT
TRANSDUCER W/STOPCOCK (MISCELLANEOUS) ×1 IMPLANT
WIRE EMERALD 3MM-J .025X260CM (WIRE) IMPLANT

## 2022-06-27 NOTE — H&P (Signed)
Advanced Heart Failure Team History and Physical Note   PCP:  [name], FNP  PCP-Cardiology: Carlyle Dolly, MD     Reason for Admission:    HPI:    [name] is a 61 y.o. with history of atrial fibrillation/flutter and nonischemic cardiomyopathy was initially referred by Dr. Harl Bowie for evaluation of CHF.  Patient was diagnosed with CHF in 2020 at [facility] in [place].  At the time, he drank heavily and used cocaine.  He stopped drinking, smoking, and cocaine in 2020 and has not used any of these substances since then. Echo in 11/20 showed EF < 10%, cath in 12/20 showed normal coronaries.  Echo has shown persistently low EF over time, echo in 2/23 showed EF 20-25%.  He has a Medtronic ICD.      Patient was admitted in 2/23 with atrial flutter/RVR. He was started on amiodarone and converted to NSR.  He had atrial flutter ablation in 5/23 and amiodarone was stopped  Patient was seen in the ER 06/01/22  dyspnea.  Given IV lasix and sent home He was sent home.    Saw Dr Aundra Dubin 06/06/22. Lasix was increased to 40 mg daily. CPX and CMRI ordered, not done yet.    He was seen in the ED 06/11/22 with increased shortness of breath. Given IV lasix and sent home the same day. Home lasix regimen was increased to 60 mg po daily.  He subsequently saw NP Clegg in clinic and reported NYHA class IV symptoms.  He was set up for [place].     Patient had RHC today, see below.  He reports several weeks of progressive orthopnea, was unable to sleep last night.  Has been sitting up in a chair.  He has been short of breath walking around his house.  Taking Lasix 60 mg daily, says he has been urinating but weight has been going up.  He is in NSR today.   RHC: RHC Procedural Findings: Hemodynamics (mmHg) RA mean 14 RV 49/24 PA 66/53, mean 54 PCWP mean 43 Oxygen saturations: PA 54% AO 93% Cardiac Output (Fick) 3.4  Cardiac Index (Fick) 1.62 PVR 3.2 WU Cardiac Output (Thermo) 2.75 Cardiac Index  (Thermo) 1.31  PAPI 0.93    PMH: 1. Atrial fibrillation/flutter: Mainly flutter.  - 5/23 Atrial flutter ablation.  2. H/o DVT 3. Chronic systolic CHF: Nonischemic cardiomyopathy.  Medtronic ICD.  - Echo (11/20): EF < 10% - Cath (12/20): Normal coronaries.  - Echo (8/22): EF 30%, mildly decreased RV systolic function.  - Echo (2/23): EF 20-25%, normal RV.  4. Prior cocaine abuse.  5. Prior heavy ETOH  Review of Systems: All systems reviewed and negative except as per HPI.   Home Medications Prior to Admission medications   Medication Sig Start Date End Date Taking? Authorizing Provider  apixaban (ELIQUIS) 5 MG TABS tablet Take 5 mg by mouth 2 (two) times daily. 05/23/20  Yes [provider]  atorvastatin (LIPITOR) 20 MG tablet Take 20 mg by mouth daily. 02/22/22  Yes [provider]  furosemide (LASIX) 20 MG tablet Take 2 tablets (40 mg total) by mouth daily. Patient taking differently: Take 40-60 mg by mouth See admin instructions. Taking 3 tabs 60 mg in the AM and 2 tabs ( 40 mg) in the afternoon 06/06/22  Yes Larey Dresser, MD  insulin glargine (LANTUS) 100 UNIT/ML Solostar Pen Inject 40 Units into the skin daily. 04/10/21  Yes [provider]  JARDIANCE 25 MG TABS tablet Take  25 mg by mouth daily. 05/18/22  Yes [provider]  levothyroxine (SYNTHROID) 25 MCG tablet Take 25 mcg by mouth daily before breakfast.   Yes [provider]  magnesium oxide (MAG-OX) 400 MG tablet Take 400 mg by mouth 2 (two) times daily.   Yes [provider]  metFORMIN (GLUCOPHAGE) 1000 MG tablet Take 1,000 mg by mouth 2 (two) times daily with a meal. 04/10/21 06/23/23 Yes [provider]  metoprolol succinate (TOPROL-XL) 25 MG 24 hr tablet Take 0.5 tablets (12.5 mg total) by mouth daily. 06/22/22 09/03/24 Yes Clegg, Amy D, NP  sacubitril-valsartan (ENTRESTO) 24-26 MG Take 1 tablet by mouth 2 (two) times daily. 12/21/21  Yes BranchAlphonse Guild, MD   sertraline (ZOLOFT) 50 MG tablet Take 50 mg by mouth daily.   Yes [provider]  spironolactone (ALDACTONE) 25 MG tablet Take 1 tablet (25 mg total) by mouth daily. 12/21/21  Yes Arnoldo Lenis, MD    Past Surgical History: Past Surgical History:  Procedure Laterality Date   A-FLUTTER ABLATION N/A 01/12/2022   Procedure: A-FLUTTER ABLATION;  Surgeon: Evans Lance, MD;  Location: [place] [facility];  Service: Cardiovascular;  Laterality: N/A;   COLONOSCOPY     Dr. Posey Pronto; One 4-6 mm hyperplastic rectal polyp, mid left-sided diverticulosis.   CYST REMOVAL NECK     HERNIA REPAIR     TONSILLECTOMY      Family History:  Family History  Problem Relation Age of Onset   Hyperlipidemia Mother    Heart disease Mother        has PPM   Diabetes Mother    Hypertension Mother    Heart disease Maternal Grandmother    Prostate cancer Maternal Grandfather    Colon cancer Neg Hx    Colon polyps Neg Hx     Social History: Social History   Socioeconomic History   Marital status: Divorced    Spouse name: Not on file   Number of children: Not on file   Years of education: Not on file   Highest education level: Not on file  Occupational History   Not on file  Tobacco Use   Smoking status: Former    Packs/day: 1.00    Years: 20.00    Total pack years: 20.00    Types: Cigarettes    Quit date: 07/07/2019    Years since quitting: 2.9   Smokeless tobacco: Never  Vaping Use   Vaping Use: Never used  Substance and Sexual Activity   Alcohol use: Not Currently   Drug use: Not Currently    Types: "Crack" cocaine    Comment: last used over 2 years ago (10/24/21).   Sexual activity: Not on file  Other Topics Concern   Not on file  Social History Narrative   Not on file   Social Determinants of Health   Financial Resource Strain: Not on file  Food Insecurity: Not on file  Transportation Needs: Not on file  Physical Activity: Not on file  Stress: Not on file  Social  Connections: Not on file    Allergies:  No Known Allergies  Objective:    Vital Signs:   Temp:  [97.4 F (36.3 C)] 97.4 F (36.3 C) (11/07 1047) Pulse Rate:  [0-99] 77 (11/07 1409) Resp:  [17-22] 20 (11/07 1409) BP: (102-130)/(81-103) 102/81 (11/07 1409) SpO2:  [85 %-99 %] 97 % (11/07 1409) Weight:  [90.3 kg] 90.3 kg (11/07 1047)   Filed Weights   06/27/22  1047  Weight: 90.3 kg     Physical Exam     General:  Well appearing. No respiratory difficulty HEENT: Normal Neck: Supple. JVP 16+. Carotids 2+ bilat; no bruits. No lymphadenopathy or thyromegaly appreciated. Cor: PMI nondisplaced. Regular rate & rhythm. +S3, no murmur.  Lungs: Decreased at bases.  Abdomen: Soft, nontender, mildly distended. No hepatosplenomegaly. No bruits or masses. Good bowel sounds. Extremities: No cyanosis, clubbing, rash, edema Neuro: Alert & oriented x 3, cranial nerves grossly intact. moves all 4 extremities w/o difficulty. Affect pleasant.   Telemetry   NSR with PACs (personally reviewed)  Labs     Basic Metabolic Panel: Recent Labs  Lab 06/22/22 1520 06/27/22 1350  NA 138 138  K 4.8 4.3  CL 103  --   CO2 25  --   GLUCOSE 179*  --   BUN 25*  --   CREATININE 1.27*  --   CALCIUM 10.0  --     Liver Function Tests: No results for input(s): "AST", "ALT", "ALKPHOS", "BILITOT", "PROT", "ALBUMIN" in the last 168 hours. No results for input(s): "LIPASE", "AMYLASE" in the last 168 hours. No results for input(s): "AMMONIA" in the last 168 hours.  CBC: Recent Labs  Lab 06/22/22 1520 06/27/22 1350  WBC 6.3  --   HGB 15.5 16.3  HCT 47.1 48.0  MCV 87.5  --   PLT 313  --     Cardiac Enzymes: No results for input(s): "CKTOTAL", "CKMB", "CKMBINDEX", "TROPONINI" in the last 168 hours.  BNP: BNP (last 3 results) Recent Labs    06/06/22 1227 06/11/22 1706 06/22/22 1520  BNP 272.1* 414.0* 495.4*    ProBNP (last 3 results) No results for input(s): "PROBNP" in the last 8760  hours.   CBG: Recent Labs  Lab 06/27/22 1017  GLUCAP 187*    Coagulation Studies: No results for input(s): "LABPROT", "INR" in the last 72 hours.  Imaging: CARDIAC CATHETERIZATION  Result Date: 06/27/2022 1. Markedly elevated PCWP 2. Primarily pulmonary venous hypertension. 3. Elevated RA pressure. 4. Low cardiac output 5. Low PAPI suggesting RV dysfunction. I will admit the patient.    Assessment/Plan   1. Acute on chronic systolic CHF: Nonischemic cardiomyopathy, diagnosed 2020.  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  Cath in 12/20 with no significant coronary disease.  Medtronic ICD. Most recent echo in 2/23 showed EF 20-25% with normal RV.  Recently, patient has been symptomatically worse, NYHA class IV with profound orthopnea.  He was set up by NP for RHC today.  This showed markedly elevated filling pressures, primarily pulmonary venous hypertension, low cardiac output, and low PAPI.  Patient was admitted for inotrope and diuresis.  - Start milrinone 0.25 with low output.   - Lasix 80 mg IV x 1 then 12 mg/hr.   - Place PICC to follow CVP and co-ox.  - Start digoxin 0.125 daily.  - Send full set of labs stat.  - Continue Entresto 24/26 bid for now.  - Continue spironolactone 25 daily.  - Continue Jardiance.  - Hold beta blocker for now with low output.  - Narrow QRS, not CRT candidate.  - I think that at this point patient needs evaluation for LVAD placement.  Will need extensive diuresis prior, however.  2. Atrial fibrillation/flutter: S/p flutter ablation.  He mainly had atrial  flutter.  He is in NSR today.  - Continue apixaban.  - Will start amiodarone gtt while on milrinone to try to keep him in rhythm.  3. H/o DVT: He is on apixaban.    Loralie Champagne, MD 06/27/2022, 2:15 PM  Advanced  Heart Failure Team Pager [phone] (M-F; 7a - 5p)  Please contact [facility] for night-coverage after hours (4p -7a ) and weekends on [email or web address]

## 2022-06-27 NOTE — Plan of Care (Signed)
  Problem: Education: Goal: Understanding of CV disease, CV risk reduction, and recovery process will improve Outcome: Progressing Goal: Individualized Educational Video(s) Outcome: Progressing   Problem: Activity: Goal: Ability to return to baseline activity level will improve Outcome: Progressing   Problem: Cardiovascular: Goal: Ability to achieve and maintain adequate cardiovascular perfusion will improve Outcome: Progressing Goal: Vascular access site(s) Level 0-1 will be maintained Outcome: Progressing   Problem: Health Behavior/Discharge Planning: Goal: Ability to safely manage health-related needs after discharge will improve Outcome: Progressing   Problem: Education: Goal: Ability to describe self-care measures that may prevent or decrease complications (Diabetes Survival Skills Education) will improve Outcome: Progressing Goal: Individualized Educational Video(s) Outcome: Progressing   Problem: Coping: Goal: Ability to adjust to condition or change in health will improve Outcome: Progressing   Problem: Fluid Volume: Goal: Ability to maintain a balanced intake and output will improve Outcome: Progressing   Problem: Health Behavior/Discharge Planning: Goal: Ability to identify and utilize available resources and services will improve Outcome: Progressing Goal: Ability to manage health-related needs will improve Outcome: Progressing   Problem: Metabolic: Goal: Ability to maintain appropriate glucose levels will improve Outcome: Progressing   Problem: Nutritional: Goal: Maintenance of adequate nutrition will improve Outcome: Progressing Goal: Progress toward achieving an optimal weight will improve Outcome: Progressing   Problem: Skin Integrity: Goal: Risk for impaired skin integrity will decrease Outcome: Progressing   Problem: Tissue Perfusion: Goal: Adequacy of tissue perfusion will improve Outcome: Progressing   Problem: Education: Goal: Knowledge  of General Education information will improve Description: Including pain rating scale, medication(s)/side effects and non-pharmacologic comfort measures Outcome: Progressing   Problem: Health Behavior/Discharge Planning: Goal: Ability to manage health-related needs will improve Outcome: Progressing   Problem: Clinical Measurements: Goal: Ability to maintain clinical measurements within normal limits will improve Outcome: Progressing Goal: Will remain free from infection Outcome: Progressing Goal: Diagnostic test results will improve Outcome: Progressing Goal: Respiratory complications will improve Outcome: Progressing Goal: Cardiovascular complication will be avoided Outcome: Progressing   Problem: Activity: Goal: Risk for activity intolerance will decrease Outcome: Progressing   Problem: Nutrition: Goal: Adequate nutrition will be maintained Outcome: Progressing   Problem: Coping: Goal: Level of anxiety will decrease Outcome: Progressing   Problem: Elimination: Goal: Will not experience complications related to bowel motility Outcome: Progressing Goal: Will not experience complications related to urinary retention Outcome: Progressing   Problem: Pain Managment: Goal: General experience of comfort will improve Outcome: Progressing   Problem: Safety: Goal: Ability to remain free from injury will improve Outcome: Progressing

## 2022-06-27 NOTE — Progress Notes (Signed)
Heart Failure Navigator Progress Note  Assessed for Heart & Vascular TOC clinic readiness.  Patient does not meet criteria due to Advanced Heart Failure patient of Dr. McLean.     Yarima Penman, BSN, RN Heart Failure Nurse Navigator Secure Chat Only   

## 2022-06-27 NOTE — Interval H&P Note (Signed)
History and Physical Interval Note:  06/27/2022 1:03 PM  Darryl Frye  has presented today for surgery, with the diagnosis of chf.  The various methods of treatment have been discussed with the patient and family. After consideration of risks, benefits and other options for treatment, the patient has consented to  Procedure(s): RIGHT HEART CATH (N/A) as a surgical intervention.  The patient's history has been reviewed, patient examined, no change in status, stable for surgery.  I have reviewed the patient's chart and labs.  Questions were answered to the patient's satisfaction.     Tekeya Geffert [employer]

## 2022-06-27 NOTE — Progress Notes (Signed)
Peripherally Inserted Central Catheter Placement  The IV Nurse has discussed with the patient and/or persons authorized to consent for the patient, the purpose of this procedure and the potential benefits and risks involved with this procedure.  The benefits include less needle sticks, lab draws from the catheter, and the patient may be discharged home with the catheter. Risks include, but not limited to, infection, bleeding, blood clot (thrombus formation), and puncture of an artery; nerve damage and irregular heartbeat and possibility to perform a PICC exchange if needed/ordered by physician.  Alternatives to this procedure were also discussed.  Bard Power PICC patient education guide, fact sheet on infection prevention and patient information card has been provided to patient /or left at bedside.  PICC inserted by Christella Noa, RN   PICC Placement Documentation  PICC Triple Lumen 06/27/22 Right Brachial 41 cm 0 cm (Active)  Indication for Insertion or Continuance of Line Chronic illness with exacerbations (CF, Sickle Cell, etc.);Vasoactive infusions 06/27/22 1737  Exposed Catheter (cm) 0 cm 06/27/22 1737  Lumen #1 Status Flushed;Saline locked;Blood return noted 06/27/22 1737  Lumen #2 Status Flushed;Saline locked;Blood return noted 06/27/22 1737  Lumen #3 Status Flushed;Saline locked;Blood return noted 06/27/22 1737  Dressing Type Transparent;Securing device 06/27/22 1737  Dressing Status Antimicrobial disc in place;Clean, Dry, Intact 06/27/22 1737  Safety Lock Not Applicable 08/13/81 5003  Line Care Connections checked and tightened 06/27/22 1737  Line Adjustment (NICU/IV Team Only) No 06/27/22 1737  Dressing Intervention New dressing 06/27/22 1737  Dressing Change Due 07/04/22 06/27/22 [facility], Nicolette Bang 06/27/2022, 5:38 PM

## 2022-06-28 ENCOUNTER — Encounter (HOSPITAL_COMMUNITY): Payer: Self-pay | Admitting: Cardiology

## 2022-06-28 ENCOUNTER — Inpatient Hospital Stay (HOSPITAL_COMMUNITY): Payer: Medicare Other

## 2022-06-28 DIAGNOSIS — I493 Ventricular premature depolarization: Secondary | ICD-10-CM | POA: Diagnosis not present

## 2022-06-28 DIAGNOSIS — I255 Ischemic cardiomyopathy: Secondary | ICD-10-CM

## 2022-06-28 DIAGNOSIS — Z7189 Other specified counseling: Secondary | ICD-10-CM

## 2022-06-28 DIAGNOSIS — Z515 Encounter for palliative care: Secondary | ICD-10-CM | POA: Diagnosis not present

## 2022-06-28 DIAGNOSIS — I5021 Acute systolic (congestive) heart failure: Secondary | ICD-10-CM | POA: Diagnosis not present

## 2022-06-28 DIAGNOSIS — I4891 Unspecified atrial fibrillation: Secondary | ICD-10-CM

## 2022-06-28 DIAGNOSIS — I5043 Acute on chronic combined systolic (congestive) and diastolic (congestive) heart failure: Secondary | ICD-10-CM | POA: Diagnosis not present

## 2022-06-28 DIAGNOSIS — I509 Heart failure, unspecified: Secondary | ICD-10-CM

## 2022-06-28 LAB — ECHOCARDIOGRAM COMPLETE
Area-P 1/2: 3.89 cm2
Height: 71 in
MV M vel: 0.97 m/s
MV Peak grad: 3.7 mmHg
S' Lateral: 5.1 cm
Weight: 3121.71 oz

## 2022-06-28 LAB — LUPUS ANTICOAGULANT PANEL
DRVVT: 44.8 s (ref 0.0–47.0)
PTT Lupus Anticoagulant: 37.7 s (ref 0.0–43.5)

## 2022-06-28 LAB — BASIC METABOLIC PANEL
Anion gap: 11 (ref 5–15)
BUN: 20 mg/dL (ref 6–20)
CO2: 27 mmol/L (ref 22–32)
Calcium: 9 mg/dL (ref 8.9–10.3)
Chloride: 98 mmol/L (ref 98–111)
Creatinine, Ser: 1.23 mg/dL (ref 0.61–1.24)
GFR, Estimated: >60 mL/min (ref >60)
Glucose, Bld: 175 mg/dL — ABNORMAL HIGH (ref 70–99)
Potassium: 3.9 mmol/L (ref 3.5–5.1)
Sodium: 136 mmol/L (ref 135–145)

## 2022-06-28 LAB — COOXEMETRY PANEL
Carboxyhemoglobin: 1.6 % — ABNORMAL HIGH (ref 0.5–1.5)
Carboxyhemoglobin: 1.6 % — ABNORMAL HIGH (ref 0.5–1.5)
Methemoglobin: <0.7 % (ref 0.0–1.5)
Methemoglobin: <0.7 % (ref 0.0–1.5)
O2 Saturation: 53.9 %
O2 Saturation: 71.8 %
Total hemoglobin: 16.4 g/dL — ABNORMAL HIGH (ref 12.0–16.0)
Total hemoglobin: 15.3 g/dL (ref 12.0–16.0)

## 2022-06-28 LAB — GLUCOSE, CAPILLARY
Glucose-Capillary: 187 mg/dL — ABNORMAL HIGH (ref 70–99)
Glucose-Capillary: 152 mg/dL — ABNORMAL HIGH (ref 70–99)
Glucose-Capillary: 231 mg/dL — ABNORMAL HIGH (ref 70–99)
Glucose-Capillary: 231 mg/dL — ABNORMAL HIGH (ref 70–99)

## 2022-06-28 LAB — HEPATITIS B SURFACE ANTIBODY, QUANTITATIVE: Hep B S AB Quant (Post): 43.9 m[IU]/mL (ref >9.9)

## 2022-06-28 MED ORDER — ASPIRIN 81 MG PO CHEW
81.0000 mg | CHEWABLE_TABLET | ORAL | Status: AC
  Administered 2022-06-29: 81 mg via ORAL
  Filled 2022-06-28: qty 1

## 2022-06-28 MED ORDER — SODIUM CHLORIDE 0.9% FLUSH: 3.0000 mL | INTRAVENOUS | Status: DC | PRN

## 2022-06-28 MED ORDER — SODIUM CHLORIDE 0.9 % IV SOLN: INTRAVENOUS | Status: DC

## 2022-06-28 MED ORDER — APIXABAN 5 MG PO TABS
5.0000 mg | ORAL_TABLET | Freq: Two times a day (BID) | ORAL | Status: DC
  Administered 2022-06-29: 5 mg via ORAL
  Filled 2022-06-28: qty 1

## 2022-06-28 MED ORDER — SODIUM CHLORIDE 0.9% FLUSH
3.0000 mL | Freq: Two times a day (BID) | INTRAVENOUS | Status: DC
  Administered 2022-06-28 – 2022-07-18 (×21): 3 mL via INTRAVENOUS

## 2022-06-28 MED ORDER — IOHEXOL 9 MG/ML PO SOLN
500.0000 mL | ORAL | Status: AC
  Administered 2022-06-28 (×2): 500 mL via ORAL

## 2022-06-28 MED ORDER — LIVING WELL WITH DIABETES BOOK
Freq: Once | Status: AC
  Filled 2022-06-28: qty 1

## 2022-06-28 MED ORDER — SODIUM CHLORIDE 0.9 % IV SOLN: 250.0000 mL | INTRAVENOUS | Status: DC | PRN

## 2022-06-28 MED ORDER — POTASSIUM CHLORIDE CRYS ER 20 MEQ PO TBCR
40.0000 meq | EXTENDED_RELEASE_TABLET | Freq: Once | ORAL | Status: AC
  Administered 2022-06-28: 40 meq via ORAL
  Filled 2022-06-28: qty 2

## 2022-06-28 MED ORDER — PERFLUTREN LIPID MICROSPHERE
1.0000 mL | INTRAVENOUS | Status: AC | PRN
  Administered 2022-06-28: 8 mL via INTRAVENOUS

## 2022-06-28 NOTE — Progress Notes (Signed)
  Echocardiogram 2D Echocardiogram has been performed.  Darryl Frye 06/28/2022, 4:48 PM

## 2022-06-28 NOTE — Consult Note (Addendum)
[address]       [place] [address]             [phone]        Worthy E Dubach Hecker Medical Record #[id number] Date of Birth: 1960/10/21  Referring: No ref. provider found Primary Care: Darryl Robert, Darryl Frye Primary Cardiologist:Branch, Roderic Palau, Darryl Frye  Chief Complaint:   No chief complaint on file.   History of Present Illness:     Expand All Collapse All      Advanced Heart Failure Team History and Physical Note   PCP:  Darryl Robert, Darryl Frye  PCP-Cardiology: Darryl Dolly, Darryl Frye      Reason for Admission:      HPI:     Darryl Frye is a 61 y.o. with history of atrial fibrillation/flutter and nonischemic cardiomyopathy was initially referred by Darryl Frye for evaluation of surgical advanced heart failure therapies.    Patient was diagnosed with CHF in 2020 at [facility] in [place].  At the time, he was using (drank heavily and used cocaine).  He stopped all substances then and hasn't relapsed.   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   Echo in 11/20 showed EF < 10%, cath in 12/20 showed normal coronaries.  Echo has shown persistently low EF over time, echo in 2/23 showed EF 20-25%.  He has a Medtronic ICD.      Patient was admitted in 2/23 with atrial flutter/RVR. He was started on amiodarone and converted to NSR.  He had atrial flutter ablation in 5/23 and amiodarone was stopped  He was seen in the ER last month twice as well - 06/01/22  and 10/22. NYHA class IV symptoms.  He was set up for [place].     Saw Dr Aundra Frye 06/06/22. Lasix was increased to 40 mg daily. CPX and CMRI ordered   He reports several weeks of progressive orthopnea, was unable to sleep last night.  Has been sitting up in a chair.  He has been short of breath walking around his house.  Taking Lasix 60 mg daily, says he has been urinating but weight has been going up.    He doesn't have much in the way of noncardiac issues. No prior  chest surgery. Creatinine is mildly elevated. Right heart function is near normal.    RHC: RHC Procedural Findings: Hemodynamics (mmHg) RA mean 14 RV 49/24 PA 66/53, mean 54 PCWP mean 43 Oxygen saturations: PA 54% AO 93% Cardiac Output (Fick) 3.4  Cardiac Index (Fick) 1.62 PVR 3.2 WU Cardiac Output (Thermo) 2.75 Cardiac Index (Thermo) 1.31  PAPI 0.93        Past Medical History:  Diagnosis Date   Acute kidney injury ([place])    Acute respiratory failure (HCC)    Atrial flutter (HCC)    on Eliquis   CHF (congestive heart failure) ([place])    EF 20-25% 10/15/21, AICD in place.   Coronary artery disease    Diabetes mellitus without complication (HCC)    DVT (deep venous thrombosis) ([place]) 06/2019   Heart murmur    Hypertension    Presence of permanent cardiac pacemaker    Renal disorder     Past Surgical History:  Procedure Laterality Date   A-FLUTTER ABLATION N/A 01/12/2022   Procedure: A-FLUTTER ABLATION;  Surgeon: Evans Lance, Darryl Frye;  Location: [facility];  Service: Cardiovascular;  Laterality: N/A;   COLONOSCOPY  Dr. Posey Pronto; One 4-6 mm hyperplastic rectal polyp, mid left-sided diverticulosis.   CYST REMOVAL NECK     HERNIA REPAIR     RIGHT HEART CATH N/A 06/27/2022   Procedure: RIGHT HEART CATH;  Surgeon: Larey Dresser, Darryl Frye;  Location: [facility];  Service: Cardiovascular;  Laterality: N/A;   TONSILLECTOMY      Social History   Tobacco Use  Smoking Status Former   Packs/day: 1.00   Years: 20.00   Total pack years: 20.00   Types: Cigarettes   Quit date: 07/07/2019   Years since quitting: 2.9  Smokeless Tobacco Never    Social History   Substance and Sexual Activity  Alcohol Use Not Currently     No Known Allergies  Current Facility-Administered Medications  Medication Dose Route Frequency Provider Last Rate Last Admin   acetaminophen (TYLENOL) tablet 650 mg  650 mg Oral Q4H PRN Larey Dresser, Darryl Frye       acetaminophen (TYLENOL)  tablet 650 mg  650 mg Oral Q4H PRN Larey Dresser, Darryl Frye       amiodarone (NEXTERONE PREMIX) 360-4.14 MG/200ML-% (1.8 mg/mL) IV infusion  30 mg/hr Intravenous Continuous Larey Dresser, Darryl Frye 16.67 mL/hr at 06/28/22 0623 30 mg/hr at 06/28/22 1610   apixaban (ELIQUIS) tablet 5 mg  5 mg Oral BID Larey Dresser, Darryl Frye   5 mg at 06/27/22 2149   atorvastatin (LIPITOR) tablet 20 mg  20 mg Oral Daily Larey Dresser, Darryl Frye       Chlorhexidine Gluconate Cloth 2 % PADS 6 each  6 each Topical Daily Larey Dresser, Darryl Frye   6 each at 06/27/22 1830   Chlorhexidine Gluconate Cloth 2 % PADS 6 each  6 each Topical Q0600 Larey Dresser, Darryl Frye   6 each at 06/28/22 0624   digoxin (LANOXIN) tablet 0.125 mg  0.125 mg Oral Daily Larey Dresser, Darryl Frye   0.125 mg at 06/27/22 1644   empagliflozin (JARDIANCE) tablet 25 mg  25 mg Oral Daily Larey Dresser, Darryl Frye   25 mg at 06/27/22 1644   furosemide (LASIX) 200 mg in dextrose 5 % 100 mL (2 mg/mL) infusion  12 mg/hr Intravenous Continuous Larey Dresser, Darryl Frye 6 mL/hr at 06/28/22 0622 12 mg/hr at 06/28/22 0622   insulin aspart (novoLOG) injection 0-15 Units  0-15 Units Subcutaneous TID WC Larey Dresser, Darryl Frye   3 Units at 06/28/22 9604   insulin aspart (novoLOG) injection 0-5 Units  0-5 Units Subcutaneous QHS Larey Dresser, Darryl Frye   3 Units at 06/27/22 2152   insulin glargine-yfgn (SEMGLEE) injection 30 Units  30 Units Subcutaneous Daily Larey Dresser, Darryl Frye   30 Units at 06/27/22 1643   levothyroxine (SYNTHROID) tablet 25 mcg  25 mcg Oral Q0600 Larey Dresser, Darryl Frye   25 mcg at 06/28/22 0619   magnesium oxide (MAG-OX) tablet 400 mg  400 mg Oral BID Larey Dresser, Darryl Frye   400 mg at 06/27/22 2149   milrinone (PRIMACOR) 20 MG/100 ML (0.2 mg/mL) infusion  0.375 mcg/kg/min Intravenous Continuous Larey Dresser, Darryl Frye 6.77 mL/hr at 06/28/22 0610 0.25 mcg/kg/min at 06/28/22 0610   mupirocin ointment (BACTROBAN) 2 % 1 Application  1 Application Nasal BID Larey Dresser, Darryl Frye   1 Application at  54/09/81 2158   ondansetron (ZOFRAN) injection 4 mg  4 mg Intravenous Q6H PRN Larey Dresser, Darryl Frye       potassium chloride SA (KLOR-CON M) CR tablet 40 mEq  40  mEq Oral Once Larey Dresser, Darryl Frye       sacubitril-valsartan Delene Loll) 24-26 mg per tablet  1 tablet Oral BID Larey Dresser, Darryl Frye   1 tablet at 06/27/22 2149   sertraline (ZOLOFT) tablet 50 mg  50 mg Oral Daily Larey Dresser, Darryl Frye       sodium chloride flush (NS) 0.9 % injection 10-40 mL  10-40 mL Intracatheter Q12H Larey Dresser, Darryl Frye       sodium chloride flush (NS) 0.9 % injection 10-40 mL  10-40 mL Intracatheter PRN Larey Dresser, Darryl Frye       spironolactone (ALDACTONE) tablet 25 mg  25 mg Oral Daily Larey Dresser, Darryl Frye        Medications Prior to Admission  Medication Sig Dispense Refill Last Dose   apixaban (ELIQUIS) 5 MG TABS tablet Take 5 mg by mouth 2 (two) times daily.   06/26/2022 at 1800   atorvastatin (LIPITOR) 20 MG tablet Take 20 mg by mouth daily.   06/27/2022 at 0700   furosemide (LASIX) 20 MG tablet Take 2 tablets (40 mg total) by mouth daily. (Patient taking differently: Take 40-60 mg by mouth See admin instructions. Taking 3 tabs 60 mg in the AM and 2 tabs ( 40 mg) in the afternoon) 180 tablet 3 06/26/2022   insulin glargine (LANTUS) 100 UNIT/ML Solostar Pen Inject 40 Units into the skin daily.   06/26/2022   JARDIANCE 25 MG TABS tablet Take 25 mg by mouth daily.   06/26/2022   levothyroxine (SYNTHROID) 25 MCG tablet Take 25 mcg by mouth daily before breakfast.   06/27/2022 at 0700   magnesium oxide (MAG-OX) 400 MG tablet Take 400 mg by mouth 2 (two) times daily.   06/27/2022   metFORMIN (GLUCOPHAGE) 1000 MG tablet Take 1,000 mg by mouth 2 (two) times daily with a meal.   06/26/2022   metoprolol succinate (TOPROL-XL) 25 MG 24 hr tablet Take 0.5 tablets (12.5 mg total) by mouth daily. 15 tablet 5 06/27/2022 at 0700   sacubitril-valsartan (ENTRESTO) 24-26 MG Take 1 tablet by mouth 2 (two) times daily. 180 tablet 2 06/26/2022    sertraline (ZOLOFT) 50 MG tablet Take 50 mg by mouth daily.   06/27/2022   spironolactone (ALDACTONE) 25 MG tablet Take 1 tablet (25 mg total) by mouth daily. 90 tablet 2 06/27/2022 at 0700    Family History  Problem Relation Age of Onset   Hyperlipidemia Mother    Heart disease Mother        has PPM   Diabetes Mother    Hypertension Mother    Heart disease Maternal Grandmother    Prostate cancer Maternal Grandfather    Colon cancer Neg Hx    Colon polyps Neg Hx      Review of Systems:   ROS14 pt reviewed and negative except as per HPI  Physical Exam: BP 101/60 (BP Location: Left Arm)   Pulse 71   Temp 97.8 F (36.6 C) (Oral)   Resp 20   Ht 5\' 11"  (1.803 m)   Wt 88.5 kg   SpO2 96%   BMI 27.21 kg/m    NAD Resp nonlaboured No chest incision, + ICD incision Abd soft ntnd, looks to have excess fluid Neurologically grossly intact Extremities WWP - no sig edema   Diagnostic Studies & Laboratory data:     Recent Radiology Findings:   [place] EKG SITE RITE  Result Date: 06/27/2022 If Site Rite image not attached, placement could not be  confirmed due to current cardiac rhythm.  CARDIAC CATHETERIZATION  Result Date: 06/27/2022 1. Markedly elevated PCWP 2. Primarily pulmonary venous hypertension. 3. Elevated RA pressure. 4. Low cardiac output 5. Low PAPI suggesting RV dysfunction. I will admit the patient.     I have independently reviewed the above radiologic studies and discussed with the patient   Recent Lab Findings: Lab Results  Component Value Date   WBC 6.6 06/27/2022   HGB 15.4 06/27/2022   HCT 47.5 06/27/2022   PLT 275 06/27/2022   GLUCOSE 175 (H) 06/28/2022   CHOL 212 (H) 06/27/2022   TRIG 134 06/27/2022   HDL 55 06/27/2022   LDLCALC 130 (H) 06/27/2022   ALT 19 06/27/2022   AST 20 06/27/2022   NA 136 06/28/2022   K 3.9 06/28/2022   CL 98 06/28/2022   CREATININE 1.23 06/28/2022   BUN 20 06/28/2022   CO2 27 06/28/2022   TSH 2.576 06/27/2022   INR  1.0 06/27/2022   HGBA1C 9.3 (H) 06/27/2022      Assessment / Plan:     Darryl Frye is a 61 y.o. with history of atrial fibrillation/flutter and nonischemic cardiomyopathy was initially referred by Darryl Frye for evaluation of surgical advanced heart failure therapies.  He is motivated and wants to get back to work.    Good candidate for LVAD.  He has had a lot of atrial flutter in the past. I may clip his LAA at the time of surgery as well.   By Feb 23 Echo - LVIDD 5.8cm, LVIDS 5.4cm Right heart function near normal No prior chest surgery Cr reasonable at 1.2  Plan to complete LVAD work-up as ordered.    Darryl Rocher Darryl Frye  CV Surgery

## 2022-06-28 NOTE — Plan of Care (Signed)

## 2022-06-28 NOTE — Inpatient Diabetes Management (Addendum)
Inpatient Diabetes Program Recommendations  AACE/ADA: New Consensus Statement on Inpatient Glycemic Control (2015)  Target Ranges:  Prepandial:   less than 140 mg/dL      Peak postprandial:   less than 180 mg/dL (1-2 hours)      Critically ill patients:  140 - 180 mg/dL   Lab Results  Component Value Date   GLUCAP 152 (H) 06/28/2022   HGBA1C 9.3 (H) 06/27/2022    Review of Glycemic Control  Latest Reference Range & Units 06/27/22 14:14 06/27/22 16:20 06/27/22 21:28 06/28/22 06:14 06/28/22 11:19  Glucose-Capillary 70 - 99 mg/dL 114 (H) 90 254 (H) 187 (H) 152 (H)   Diabetes history: DM 2 Outpatient Diabetes medications:  Lantus 40 units daily, Jardiance 25 mg daily, Metformin 1000 mg bid Current orders for Inpatient glycemic control:  Novolog 0-15 units tid with meals and HS Semglee 30 units daily Inpatient Diabetes Program Recommendations:    Agree with current orders. Will talk to patient today regarding A1C.    Thanks,  Adah Perl, RN, BC-ADM Inpatient Diabetes Coordinator Pager [phone]  (8a-5p)  Addendum [phone]- Spoke with patient regarding DM management.  He states that blood sugars are between 150-200's at home.  We discussed goal blood sugars 80-130 mg/dL in AM and that Post prandial should not be >180 mg/dL.  Explained importance of glycemic control especially if he has surgery.  Briefly reviewed diet and he states he mostly drinks water.  He does eat lots of bananas, pinto beans, and sometimes drinks regular sodas.  We briefly reviewed the plate method and the importance of watching serving size.  Patient appreciative of information. May need adjustment in home medications in order to achieve goal glucose control.  Ordered patient book on Living well with Diabetes.  He has medications and supplies at homes and state he usually checks blood sugars 1-2 times/day.  Will follow.

## 2022-06-28 NOTE — Progress Notes (Signed)
This chaplain responded to PMT NP-Shae consult for spiritual care and creating/updating the Pt. Advance Directive.   The chaplain checked in with the Pt. RN-April before the visit and understands the Pt. is sleeping after a busy day. This chaplain will revisit on Thursday.  Chaplain Sallyanne Kuster [phone]

## 2022-06-28 NOTE — Progress Notes (Signed)
Patient ID: Darryl Frye, male   DOB: October 29, 1960, 61 y.o.   MRN: [id number]     Advanced Heart Failure Rounding Note  PCP-Cardiologist: Carlyle Dolly, MD   Subjective:    Feels better this morning.  Co-ox on milrinone 0.25 was 72%.  CVP 8 this morning.  I/Os net negative 3607 with weight down.    He is in NSR on amiodarone gtt.   RHC Procedural Findings: Hemodynamics (mmHg) RA mean 14 RV 49/24 PA 66/53, mean 54 PCWP mean 43 Oxygen saturations: PA 54% AO 93% Cardiac Output (Fick) 3.4  Cardiac Index (Fick) 1.62 PVR 3.2 WU Cardiac Output (Thermo) 2.75 Cardiac Index (Thermo) 1.31  PAPI 0.93    Objective:   Weight Range: 88.5 kg Body mass index is 27.21 kg/m.   Vital Signs:   Temp:  [97.4 F (36.3 C)-98.5 F (36.9 C)] 97.8 F (36.6 C) (11/08 0726) Pulse Rate:  [0-99] 71 (11/08 0726) Resp:  [15-28] 20 (11/08 0726) BP: (98-130)/(60-103) 101/60 (11/08 0726) SpO2:  [85 %-99 %] 96 % (11/08 0726) Weight:  [88.5 kg-91.1 kg] 88.5 kg (11/08 0623) Last BM Date : 06/26/22  Weight change: Filed Weights   06/27/22 1047 06/27/22 1445 06/28/22 0623  Weight: 90.3 kg 91.1 kg 88.5 kg    Intake/Output:   Intake/Output Summary (Last 24 hours) at 06/28/2022 0832 Last data filed at 06/28/2022 0730 Gross per 24 hour  Intake 1522.25 ml  Output 4775 ml  Net -3252.75 ml      Physical Exam    General:  Well appearing. No resp difficulty HEENT: Normal Neck: Supple. JVP 8 cm. Carotids 2+ bilat; no bruits. No lymphadenopathy or thyromegaly appreciated. Cor: PMI lateral. Regular rate & rhythm. No rubs, gallops or murmurs. Lungs: Clear Abdomen: Soft, nontender, nondistended. No hepatosplenomegaly. No bruits or masses. Good bowel sounds. Extremities: No cyanosis, clubbing, rash, edema Neuro: Alert & orientedx3, cranial nerves grossly intact. moves all 4 extremities w/o difficulty. Affect pleasant   Telemetry   NSR 70s (personally reviewed)  Labs    CBC Recent Labs     06/27/22 1450 06/27/22 1511  WBC  --  6.6  HGB 16.3 15.4  HCT 48.0 47.5  MCV  --  87.0  PLT  --  563   Basic Metabolic Panel Recent Labs    06/27/22 1511 06/27/22 1825 06/28/22 0445  NA 138  --  136  K 4.5  --  3.9  CL 102  --  98  CO2 25  --  27  GLUCOSE 119*  --  175*  BUN 16  --  20  CREATININE 1.02  --  1.23  CALCIUM 9.3  --  9.0  MG  --  2.0  --    Liver Function Tests Recent Labs    06/27/22 1511  AST 20  ALT 19  ALKPHOS 117  BILITOT 0.7  PROT 7.1  ALBUMIN 3.8   No results for input(s): "LIPASE", "AMYLASE" in the last 72 hours. Cardiac Enzymes No results for input(s): "CKTOTAL", "CKMB", "CKMBINDEX", "TROPONINI" in the last 72 hours.  BNP: BNP (last 3 results) Recent Labs    06/11/22 1706 06/22/22 1520 06/27/22 1511  BNP 414.0* 495.4* 333.8*    ProBNP (last 3 results) No results for input(s): "PROBNP" in the last 8760 hours.   D-Dimer No results for input(s): "DDIMER" in the last 72 hours. Hemoglobin A1C Recent Labs    06/27/22 1511  HGBA1C 9.3*   Fasting Lipid Panel Recent Labs  06/27/22 1825  CHOL 212*  HDL 55  LDLCALC 130*  TRIG 134  CHOLHDL 3.9   Thyroid Function Tests Recent Labs    06/27/22 1511  TSH 2.576    Other results:   Imaging    DG Chest 2 View  Result Date: 06/28/2022 CLINICAL DATA:  Congestive heart failure EXAM: CHEST - 2 VIEW COMPARISON:  Chest radiograph 06/11/2022 FINDINGS: The dual lead left chest wall cardiac device is stable. There is a right upper extremity PICC with tip in the lower SVC. The cardiomediastinal silhouette is stable. The lungs well inflated. There is no focal consolidation. There are trace bilateral pleural effusions. There is no definite overt pulmonary edema. There is no pneumothorax There is no acute osseous abnormality. IMPRESSION: Trace bilateral pleural effusions. No definite overt pulmonary edema. Electronically Signed   By: Valetta Mole M.D.   On: 06/28/2022 08:26   [place] EKG  SITE RITE  Result Date: 06/27/2022 If Site Rite image not attached, placement could not be confirmed due to current cardiac rhythm.  CARDIAC CATHETERIZATION  Result Date: 06/27/2022 1. Markedly elevated PCWP 2. Primarily pulmonary venous hypertension. 3. Elevated RA pressure. 4. Low cardiac output 5. Low PAPI suggesting RV dysfunction. I will admit the patient.     Medications:     Scheduled Medications:  apixaban  5 mg Oral BID   atorvastatin  20 mg Oral Daily   Chlorhexidine Gluconate Cloth  6 each Topical Daily   Chlorhexidine Gluconate Cloth  6 each Topical Q0600   digoxin  0.125 mg Oral Daily   empagliflozin  25 mg Oral Daily   insulin aspart  0-15 Units Subcutaneous TID WC   insulin aspart  0-5 Units Subcutaneous QHS   insulin glargine-yfgn  30 Units Subcutaneous Daily   levothyroxine  25 mcg Oral Q0600   magnesium oxide  400 mg Oral BID   mupirocin ointment  1 Application Nasal BID   potassium chloride  40 mEq Oral Once   sacubitril-valsartan  1 tablet Oral BID   sertraline  50 mg Oral Daily   sodium chloride flush  10-40 mL Intracatheter Q12H   spironolactone  25 mg Oral Daily    Infusions:  amiodarone 30 mg/hr (06/28/22 0623)   furosemide (LASIX) 200 mg in dextrose 5 % 100 mL (2 mg/mL) infusion 12 mg/hr (06/28/22 0622)   milrinone 0.375 mcg/kg/min (06/28/22 0807)    PRN Medications: acetaminophen, acetaminophen, ondansetron (ZOFRAN) IV, sodium chloride flush   Assessment/Plan   1. Acute on chronic systolic CHF: Nonischemic cardiomyopathy, diagnosed 2020.  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  Cath in 12/20 with no significant coronary disease.  Medtronic ICD. Most recent echo in 2/23 showed EF 20-25% with normal RV.  Recently, patient has been symptomatically worse, NYHA class IV  with profound orthopnea.  He was set up by NP for RHC.  This showed markedly elevated filling pressures, primarily pulmonary venous hypertension, low cardiac output, and low PAPI.  Patient was admitted for inotrope and diuresis.  He was started on milrinone with co-ox up to 72% this morning. Breathing much better, diuresed well, CVP 8.   - Continue milrinone 0.25, this has helped him significantly.   - Continue Lasix 12 mg/hr today.  CVP 8 but LVEDP/PCWP was elevated markedly out of proportion to RA pressure on  cath.  Probably back to po diuretic tomorrow.   - Started digoxin 0.125 daily.  - Continue Entresto 24/26 bid for now, watch BP.  - Continue spironolactone 25 daily.  - Continue Jardiance.  - Hold beta blocker with low output/volume overload.  - Narrow QRS, not CRT candidate.  - I think that at this point patient needs evaluation for LVAD placement.  He is significantly improved on milrinone gtt and may be able to go home on milrinone while we are deciding on LVAD and timing.  He may have trouble finding someone to stay with him post-op, will need to discuss.  - Will repeat RHC on milrinone tomorrow to make sure PA pressure/PCWP look better.  2. Atrial fibrillation/flutter: S/p flutter ablation.  He mainly had atrial flutter.  He is in NSR today.  - Continue apixaban.  - Continue amiodarone gtt while on milrinone to try to keep him in rhythm.  3. H/o DVT: He is on apixaban.   Length of Stay: 1  Loralie Champagne, MD  06/28/2022, 8:32 AM  Advanced Heart Failure Team Pager [phone] (M-F; 7a - 5p)  Please contact [facility] for night-coverage after hours (5p -7a ) and weekends on [email or web address]

## 2022-06-28 NOTE — Consult Note (Signed)
Consultation Note Date: 06/28/2022   Patient Name: Darryl Frye  DOB: 03/13/1961  MRN: [id number]  Age / Sex: 61 y.o., male  PCP: [name], [place] Referring Physician: Larey Dresser, MD  Reason for Consultation: Establishing goals of care  HPI/Patient Profile: 61 y.o. male  with past medical history of Nonischemic cardiomyopathy diagnosed in 2020, ICD placement, and substance abuse (none since 2020) admitted on 06/27/2022 with shortness of breath.  Patient with acute on chronic heart failure with worsening symptoms.  Patient now being evaluated for LVAD placement.  PMT consulted to assist with LVAD discussions.  Clinical Assessment and Goals of Care: I have reviewed medical records including EPIC notes, labs and imaging, received report from RN, assessed the patient and then met with patient to discuss diagnosis prognosis, GOC, EOL wishes, disposition and options.  I introduced Palliative Medicine as specialized medical care for people living with serious illness. It focuses on providing relief from the symptoms and stress of a serious illness. The goal is to improve quality of life for both the patient and the family.  We discussed a brief life review of the patient.  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  He enjoys his work.  He tells me he has been unable to work for the past few weeks related to his shortness of breath.  He tells me he has an ex-wife Darryl Frye that he still has a good relationship with and she is remained involved in his medical care.  He also tells me he has a daughter who lives in [place].  He tells me he has support from other friends and neighbors.  As far as functional and nutritional status patient tells me he has been doing well.  He is he remains able to care for himself and has a great appetite.  He tells me his only limiting factor is his shortness of breath.    We discussed patient's current illness and what it means in the larger context of patient's on-going co-morbidities.    He tells me he understands he has a poor prognosis related to his heart failure and the intervention that has been recommended for him is an LVAD.  He tells me about his discussions with the heart failure team as well as other LVAD patients.  He feels he has a good understanding of what to expect moving forward.  Patient tells me he is still exploring who is able to support him through the LVAD process.  He is unsure at this point and continues to have conversations with family and friends.  I attempted to elicit values and goals of care important to the patient.    Patient would like to complete HCPOA documentation as he was like his ex-wife Darryl Frye to be able to make medical decisions for him as well as his daughter.  He is also interested in completing living will.  He tells me he is open to all medical interventions but would never want to remain on [place]-term artificial support if his prognosis is poor.  Discussed with patient the importance of continued conversation with family and the medical providers regarding overall plan of care and treatment options, ensuring decisions are within the context of the patients values and GOCs.    Questions and concerns were addressed. The family was encouraged to call with questions or concerns.  Primary Decision Maker PATIENT Would like to name ex-wife Darryl Frye and daughter as HCPOA's  SUMMARY OF  RECOMMENDATIONS   Consult for her advanced directive completion Patient agreeable to spiritual care consult as well Patient feels he has good understanding of his disease and recommendation for LVAD, he continues to learn more about what LVAD entails He is exploring support system for himself post LVAD  - will need social work support  Code Status/Advance Care Planning: Full code      Primary Diagnoses: Present on  Admission: **None**   I have reviewed the medical record, interviewed the patient and family, and examined the patient. The following aspects are pertinent.  Past Medical History:  Diagnosis Date   Acute kidney injury ([facility])    Acute respiratory failure ([facility])    Atrial flutter ([place])    on Eliquis   CHF (congestive heart failure) ([facility])    EF 20-25% 10/15/21, AICD in place.   Coronary artery disease    Diabetes mellitus without complication (HCC)    DVT (deep venous thrombosis) ([place]) 06/2019   Heart murmur    Hypertension    Presence of permanent cardiac pacemaker    Renal disorder    Social History   Socioeconomic History   Marital status: Divorced    Spouse name: Not on file   Number of children: Not on file   Years of education: Not on file   Highest education level: Not on file  Occupational History   Not on file  Tobacco Use   Smoking status: Former    Packs/day: 1.00    Years: 20.00    Total pack years: 20.00    Types: Cigarettes    Quit date: 07/07/2019    Years since quitting: 2.9   Smokeless tobacco: Never  Vaping Use   Vaping Use: Never used  Substance and Sexual Activity   Alcohol use: Not Currently   Drug use: Not Currently    Types: "Crack" cocaine    Comment: last used over 2 years ago (10/24/21).   Sexual activity: Not on file  Other Topics Concern   Not on file  Social History Narrative   Not on file   Social Determinants of Health   Financial Resource Strain: Not on file  Food Insecurity: Not on file  Transportation Needs: Not on file  Physical Activity: Not on file  Stress: Not on file  Social Connections: Not on file   Family History  Problem Relation Age of Onset   Hyperlipidemia Mother    Heart disease Mother        has PPM   Diabetes Mother    Hypertension Mother    Heart disease Maternal Grandmother    Prostate cancer Maternal Grandfather    Colon cancer Neg Hx    Colon polyps Neg Hx    Scheduled Meds:  [START ON 06/29/2022]  apixaban  5 mg Oral BID   atorvastatin  20 mg Oral Daily   Chlorhexidine Gluconate Cloth  6 each Topical Daily   Chlorhexidine Gluconate Cloth  6 each Topical Q0600   digoxin  0.125 mg Oral Daily   empagliflozin  25 mg Oral Daily   insulin aspart  0-15 Units Subcutaneous TID WC   insulin aspart  0-5 Units Subcutaneous QHS   insulin glargine-yfgn  30 Units Subcutaneous Daily   levothyroxine  25 mcg Oral Q0600   living well with diabetes book   Does not apply Once   magnesium oxide  400 mg Oral BID   mupirocin ointment  1 Application Nasal BID   sacubitril-valsartan  1 tablet Oral BID  sertraline  50 mg Oral Daily   sodium chloride flush  10-40 mL Intracatheter Q12H   sodium chloride flush  3 mL Intravenous Q12H   spironolactone  25 mg Oral Daily   Continuous Infusions:  amiodarone 30 mg/hr (06/28/22 0623)   furosemide (LASIX) 200 mg in dextrose 5 % 100 mL (2 mg/mL) infusion 12 mg/hr (06/28/22 0622)   milrinone 0.25 mcg/kg/min (06/28/22 1517)   PRN Meds:.acetaminophen, acetaminophen, ondansetron (ZOFRAN) IV, sodium chloride flush No Known Allergies Review of Systems  Respiratory:  Positive for shortness of breath.     Physical Exam Constitutional:      General: He is not in acute distress. Pulmonary:     Effort: Pulmonary effort is normal.  Skin:    General: Skin is warm and dry.  Neurological:     Mental Status: He is alert and oriented to person, place, and time.  Psychiatric:        Mood and Affect: Mood normal.        Behavior: Behavior normal.     Vital Signs: BP 99/83 (BP Location: Left Arm)   Pulse 68   Temp 97.7 F (36.5 C) (Oral)   Resp 17   Ht _0  (1.803 m)   Wt 88.5 kg   SpO2 96%   BMI 27.21 kg/m  Pain Scale: 0-10 POSS *See Group Information*: 1-Acceptable,Awake and alert Pain Score: 0-No pain   SpO2: SpO2: 96 % O2 Device:SpO2: 96 % O2 Flow Rate: .O2 Flow Rate (L/min): 3 L/min  IO: Intake/output summary:  Intake/Output Summary (Last 24  hours) at 06/28/2022 1523 Last data filed at 06/28/2022 1517 Gross per 24 hour  Intake 1902.71 ml  Output 6825 ml  Net -4922.29 ml    LBM: Last BM Date : 06/26/22 Baseline Weight: Weight: 90.3 kg Most recent weight: Weight: 88.5 kg     Palliative Assessment/Data:PPS 60%     *Please note that this is a verbal dictation therefore any spelling or grammatical errors are due to the "[employer] One" system interpretation.  Juel Burrow, DNP, AGNP-C Palliative Medicine Team [phone] Pager: [phone]

## 2022-06-29 ENCOUNTER — Inpatient Hospital Stay (HOSPITAL_COMMUNITY): Payer: Medicare Other

## 2022-06-29 ENCOUNTER — Encounter (HOSPITAL_COMMUNITY): Payer: Self-pay | Admitting: Cardiology

## 2022-06-29 ENCOUNTER — Encounter (HOSPITAL_COMMUNITY)
Admission: AD | Disposition: A | Discharge status: Rehabilitation Facility | Payer: Self-pay | Source: Home / Self Care | Attending: Cardiothoracic Surgery

## 2022-06-29 DIAGNOSIS — R570 Cardiogenic shock: Secondary | ICD-10-CM | POA: Diagnosis not present

## 2022-06-29 DIAGNOSIS — I5084 End stage heart failure: Secondary | ICD-10-CM

## 2022-06-29 HISTORY — PX: RIGHT HEART CATH: CATH118263

## 2022-06-29 LAB — POCT I-STAT EG7
Acid-Base Excess: 2 mmol/L (ref 0.0–2.0)
Acid-Base Excess: 2 mmol/L (ref 0.0–2.0)
Bicarbonate: 27.2 mmol/L (ref 20.0–28.0)
Bicarbonate: 27.5 mmol/L (ref 20.0–28.0)
Calcium, Ion: 1.15 mmol/L (ref 1.15–1.40)
Calcium, Ion: 1.16 mmol/L (ref 1.15–1.40)
HCT: 52 % (ref 39.0–52.0)
HCT: 52 % (ref 39.0–52.0)
Hemoglobin: 17.7 g/dL — ABNORMAL HIGH (ref 13.0–17.0)
Hemoglobin: 17.7 g/dL — ABNORMAL HIGH (ref 13.0–17.0)
O2 Saturation: 65 %
O2 Saturation: 66 %
Potassium: 3.8 mmol/L (ref 3.5–5.1)
Potassium: 3.8 mmol/L (ref 3.5–5.1)
Sodium: 134 mmol/L — ABNORMAL LOW (ref 135–145)
Sodium: 134 mmol/L — ABNORMAL LOW (ref 135–145)
TCO2: 28 mmol/L (ref 22–32)
TCO2: 29 mmol/L (ref 22–32)
pCO2, Ven: 42.6 mmHg — ABNORMAL LOW (ref 44–60)
pCO2, Ven: 43.5 mmHg — ABNORMAL LOW (ref 44–60)
pH, Ven: 7.409 (ref 7.25–7.43)
pH, Ven: 7.413 (ref 7.25–7.43)
pO2, Ven: 33 mmHg (ref 32–45)
pO2, Ven: 34 mmHg (ref 32–45)

## 2022-06-29 LAB — BASIC METABOLIC PANEL
Anion gap: 12 (ref 5–15)
BUN: 29 mg/dL — ABNORMAL HIGH (ref 6–20)
CO2: 25 mmol/L (ref 22–32)
Calcium: 8.5 mg/dL — ABNORMAL LOW (ref 8.9–10.3)
Chloride: 94 mmol/L — ABNORMAL LOW (ref 98–111)
Creatinine, Ser: 1.5 mg/dL — ABNORMAL HIGH (ref 0.61–1.24)
GFR, Estimated: 53 mL/min — ABNORMAL LOW (ref >60)
Glucose, Bld: 157 mg/dL — ABNORMAL HIGH (ref 70–99)
Potassium: 3.7 mmol/L (ref 3.5–5.1)
Sodium: 131 mmol/L — ABNORMAL LOW (ref 135–145)

## 2022-06-29 LAB — COOXEMETRY PANEL
Carboxyhemoglobin: 0.5 % (ref 0.5–1.5)
Methemoglobin: <0.7 % (ref 0.0–1.5)
O2 Saturation: 51.9 %
Total hemoglobin: 16.1 g/dL — ABNORMAL HIGH (ref 12.0–16.0)

## 2022-06-29 LAB — GLUCOSE, CAPILLARY
Glucose-Capillary: 163 mg/dL — ABNORMAL HIGH (ref 70–99)
Glucose-Capillary: 174 mg/dL — ABNORMAL HIGH (ref 70–99)
Glucose-Capillary: 265 mg/dL — ABNORMAL HIGH (ref 70–99)
Glucose-Capillary: 237 mg/dL — ABNORMAL HIGH (ref 70–99)
Glucose-Capillary: 101 mg/dL — ABNORMAL HIGH (ref 70–99)
Glucose-Capillary: 221 mg/dL — ABNORMAL HIGH (ref 70–99)

## 2022-06-29 LAB — MAGNESIUM: Magnesium: 2.5 mg/dL — ABNORMAL HIGH (ref 1.7–2.4)

## 2022-06-29 LAB — LIPOPROTEIN A (LPA): Lipoprotein (a): 182.4 nmol/L — ABNORMAL HIGH (ref <75.0)

## 2022-06-29 SURGERY — RIGHT HEART CATH
Anesthesia: LOCAL

## 2022-06-29 MED ORDER — HEPARIN (PORCINE) IN NACL 1000-0.9 UT/500ML-% IV SOLN
INTRAVENOUS | Status: DC | PRN
  Administered 2022-06-29: 500 mL

## 2022-06-29 MED ORDER — GLUCERNA SHAKE PO LIQD: 237.0000 mL | Freq: Three times a day (TID) | ORAL | Status: DC

## 2022-06-29 MED ORDER — ADULT MULTIVITAMIN W/MINERALS CH
1.0000 | ORAL_TABLET | Freq: Every day | ORAL | Status: DC
  Administered 2022-06-29 – 2022-07-21 (×22): 1 via ORAL
  Filled 2022-06-29 (×22): qty 1

## 2022-06-29 MED ORDER — POTASSIUM CHLORIDE CRYS ER 20 MEQ PO TBCR
20.0000 meq | EXTENDED_RELEASE_TABLET | Freq: Once | ORAL | Status: AC
  Administered 2022-06-29: 20 meq via ORAL
  Filled 2022-06-29: qty 1

## 2022-06-29 MED ORDER — LIDOCAINE HCL (PF) 1 % IJ SOLN
INTRAMUSCULAR | Status: DC | PRN
  Administered 2022-06-29: 2 mL

## 2022-06-29 MED ORDER — GLUCERNA SHAKE PO LIQD
237.0000 mL | Freq: Every day | ORAL | Status: DC
  Administered 2022-06-30 – 2022-07-07 (×3): 237 mL via ORAL

## 2022-06-29 SURGICAL SUPPLY — 7 items
CATH BALLN WEDGE 5F 110CM (CATHETERS) IMPLANT
GUIDEWIRE .025 260CM (WIRE) IMPLANT
PACK CARDIAC CATHETERIZATION (CUSTOM PROCEDURE TRAY) ×1 IMPLANT
SHEATH GLIDE SLENDER 4/5FR (SHEATH) IMPLANT
TRANSDUCER W/STOPCOCK (MISCELLANEOUS) ×1 IMPLANT
TUBING ART PRESS 72  MALE/FEM (TUBING) ×1
TUBING ART PRESS 72 MALE/FEM (TUBING) IMPLANT

## 2022-06-29 NOTE — Progress Notes (Signed)
Initial Nutrition Assessment  DOCUMENTATION CODES:   Not applicable  INTERVENTION:   MVI with Minerals  Glucerna Shake po daily at lunch, each supplement provides 220 kcal and 10 grams of protein  If po intake declines, recommend liberalizing diet and increasing oral nutrition supplements (change to Ensure/Boost which is higher in kcals, protein)  Educated pt on importance of adequate nutrition pre-op and post-op  NUTRITION DIAGNOSIS:   Increased nutrient needs related to catabolic illness, post-op healing as evidenced by estimated needs.  GOAL:   Patient will meet greater than or equal to 90% of their needs   MONITOR:   PO intake, Supplement acceptance, Labs, Weight trends, Skin  REASON FOR ASSESSMENT:   Consult LVAD Eval  ASSESSMENT:   61 yo male admitted with acute on chronic CHF, now being evaluated for possible LVAD. Last ECHO 2/23 with EF 20-25% with EF <10% in 11/20 with initiatial dx. PMH includes nonischemic CM, a.fib/flutter, DM, CAD, HTN, DVT. Pt with hx of heavy EtOH and cocaine use but has been sober for several years..    Currently being evaluated for possible LVAD. Pt has been seen by Dr. Tenny Craw and deemed a good candidate for LVAD.    Recorded po intake 100% of all meals. Pt with good appetite currently.    Pt reports he had been eating less as an outpatient in an effort to lose weight. His abdomen/ "belly" had gotten larger and he could not figure why. He tried eating less, he also tried using laxatives as he thought the abdominal distention might have been related to constipation. RD explained to pt that more than likely the increase in abdominal girth was likely related to fluid gains. Pt reports that makes sense as he notices that his abdomen has reduced in size and appetite has improved with decrease in early satiety since he has been in the hospital.   Recently, pt has been taking a protein smoothie abd/or Equate Brand Glucose Control supplement. Pt  has been eating breakfast and dinner but not lunch in an effort to lose weight. Breakfast is usually eggs, [place] sausage, grits/oatmeal and Dinner is usually Subway sandwich or baked chicken/fish with green beans and mashed potatoes.   Pt is edentulous; pt has dentures but reports he does not wear them at meal times. Pt reports he can chew most all foods except for salad/raw veggies, very tough meat, fruits like an apple. Otherwise he is able to eat most anything.   Net negative 6L since admission. Current wt 89.3 kg   Tox screen negative.   Pt has been SOB and not feeling well recently so physical function has been declined but otherwise is very active. Pt also works a part time job in the afternoons.    Labs: CBGs 163-265, sodium 131 (L) Meds: magox, MVI, KCL, ss novolog, semglee  NUTRITION - FOCUSED PHYSICAL EXAM:  Flowsheet Row Most Recent Value  Orbital Region No depletion  Upper Arm Region No depletion  Thoracic and Lumbar Region No depletion  Buccal Region No depletion  Temple Region No depletion  Clavicle Bone Region No depletion  Clavicle and Acromion Bone Region No depletion  Scapular Bone Region No depletion  Dorsal Hand No depletion  Patellar Region No depletion  Anterior Thigh Region No depletion  Posterior Calf Region No depletion  Edema (RD Assessment) None       Diet Order:   Diet Order             Diet heart healthy/carb  modified Room service appropriate? Yes; Fluid consistency: Thin  Diet effective now                   EDUCATION NEEDS:   Education needs have been addressed  Skin:  Skin Assessment: Reviewed RN Assessment  Last BM:  11/6  Height:   Ht Readings from Last 1 Encounters:  06/27/22 5\' 11"  (1.803 m)    Weight:   Wt Readings from Last 1 Encounters:  06/29/22 89.3 kg     BMI:  Body mass index is 27.46 kg/m.  Estimated Nutritional Needs:   Kcal:  2100-2300 kcals  Protein:  110-125 g  Fluid:  1.8 L   Kerman Passey  MS, RDN, LDN, CNSC Registered Dietitian 3 Clinical Nutrition RD Pager and On-Call Pager Number Located in [place]

## 2022-06-29 NOTE — Progress Notes (Signed)
Met with pt today. All questions answered about VAD implant. [name] was able to meet with a VAD pt yesterday. He states that this encounter was very helpful for him. All testing has been completed with the exception of PFTs and social evaluation.  Pt tells me that he has a friend and his ex wife who will be attending the social evaluation in the morning.   Pt will require a PA through [facility].  Pt is currently on Milrinone 0.375 mcg/kg/min.  VAD coordinator ordered 6MW w/PT for [facility] data requirements.  Tanda Rockers RN, BSN VAD Coordinator 24/7 Pager [phone]

## 2022-06-29 NOTE — Inpatient Diabetes Management (Signed)
Inpatient Diabetes Program Recommendations  AACE/ADA: New Consensus Statement on Inpatient Glycemic Control (2015)  Target Ranges:  Prepandial:   less than 140 mg/dL      Peak postprandial:   less than 180 mg/dL (1-2 hours)      Critically ill patients:  140 - 180 mg/dL   Lab Results  Component Value Date   GLUCAP 163 (H) 06/29/2022   HGBA1C 9.3 (H) 06/27/2022    Review of Glycemic Control  Latest Reference Range & Units 06/28/22 06:14 06/28/22 11:19 06/28/22 16:17 06/28/22 21:13 06/29/22 06:09  Glucose-Capillary 70 - 99 mg/dL 187 (H) 152 (H) 231 (H) 231 (H) 163 (H)   Diabetes history: DM 2 Outpatient Diabetes medications:  Lantus 40 units daily, Jardiance 25 mg daily, Metformin 1000 mg bid Current orders for Inpatient glycemic control:  Novolog 0-15 units tid with meals and HS Semglee 30 units daily  Inpatient Diabetes Program Recommendations:    -   Increase Semglee to 35 units -   Decrease Novolog to 0-9 units tid + hs  Thanks,  Tama Headings RN, MSN, BC-ADM Inpatient Diabetes Coordinator Team Pager [phone] (8a-5p)

## 2022-06-29 NOTE — Progress Notes (Signed)
Mobility Specialist Progress Note    06/29/22 1036  Mobility  Activity Ambulated with assistance in hallway  Level of Assistance Contact guard assist, steadying assist  Assistive Device Front wheel walker  Distance Ambulated (ft) 200 ft  Activity Response Tolerated well  Mobility Referral Yes  $Mobility charge 1 Mobility   Pre-Mobility: 78 HR, 106/72 (84) BP, 94% SpO2 During Mobility: 103 HR Post-Mobility: 76 HR, 108/76 (86) BP, 95% SpO2  Pt received in bed and agreeable. No complaints. On 2LO2. SpO2 in 90s w/ good pleth. Returned to bed with call bell in reach. RN notified.   Hildred Alamin Mobility Specialist  Please Psychologist, sport and exercise or Rehab Office at [phone]

## 2022-06-29 NOTE — Plan of Care (Signed)

## 2022-06-29 NOTE — Progress Notes (Signed)
CSW met with patient at bedside to discuss LVAD implant and need to complete psychosocial eval. Patient shared that he has limited options for caregivers and asked about alternatives as in rehab. CSW discussed in depth the need for a caregiver as the responsibilities of caregiving are for the life of the LVAD. Patient states he has a friend who he can call and discuss and will ask for his friend to come to the hospital tomorrow to join in the assessment process. CSW will follow up with patient tomorrow. Raquel Sarna, [place], [place]

## 2022-06-29 NOTE — Plan of Care (Signed)
  Problem: Education: Goal: Understanding of CV disease, CV risk reduction, and recovery process will improve Outcome: Progressing Goal: Individualized Educational Video(s) Outcome: Progressing   Problem: Activity: Goal: Ability to return to baseline activity level will improve Outcome: Progressing   Problem: Cardiovascular: Goal: Ability to achieve and maintain adequate cardiovascular perfusion will improve Outcome: Progressing Goal: Vascular access site(s) Level 0-1 will be maintained Outcome: Progressing   Problem: Health Behavior/Discharge Planning: Goal: Ability to safely manage health-related needs after discharge will improve Outcome: Progressing   Problem: Education: Goal: Ability to describe self-care measures that may prevent or decrease complications (Diabetes Survival Skills Education) will improve Outcome: Progressing Goal: Individualized Educational Video(s) Outcome: Progressing   Problem: Coping: Goal: Ability to adjust to condition or change in health will improve Outcome: Progressing   Problem: Fluid Volume: Goal: Ability to maintain a balanced intake and output will improve Outcome: Progressing   Problem: Health Behavior/Discharge Planning: Goal: Ability to identify and utilize available resources and services will improve Outcome: Progressing Goal: Ability to manage health-related needs will improve Outcome: Progressing   Problem: Metabolic: Goal: Ability to maintain appropriate glucose levels will improve Outcome: Progressing   Problem: Nutritional: Goal: Maintenance of adequate nutrition will improve Outcome: Progressing Goal: Progress toward achieving an optimal weight will improve Outcome: Progressing   Problem: Skin Integrity: Goal: Risk for impaired skin integrity will decrease Outcome: Progressing   Problem: Tissue Perfusion: Goal: Adequacy of tissue perfusion will improve Outcome: Progressing   Problem: Education: Goal: Knowledge  of General Education information will improve Description: Including pain rating scale, medication(s)/side effects and non-pharmacologic comfort measures Outcome: Progressing   Problem: Health Behavior/Discharge Planning: Goal: Ability to manage health-related needs will improve Outcome: Progressing   Problem: Clinical Measurements: Goal: Ability to maintain clinical measurements within normal limits will improve Outcome: Progressing Goal: Will remain free from infection Outcome: Progressing Goal: Diagnostic test results will improve Outcome: Progressing Goal: Respiratory complications will improve Outcome: Progressing Goal: Cardiovascular complication will be avoided Outcome: Progressing   Problem: Activity: Goal: Risk for activity intolerance will decrease Outcome: Progressing   Problem: Nutrition: Goal: Adequate nutrition will be maintained Outcome: Progressing   Problem: Coping: Goal: Level of anxiety will decrease Outcome: Progressing   Problem: Elimination: Goal: Will not experience complications related to bowel motility Outcome: Progressing Goal: Will not experience complications related to urinary retention Outcome: Progressing   Problem: Pain Managment: Goal: General experience of comfort will improve Outcome: Progressing   Problem: Skin Integrity: Goal: Risk for impaired skin integrity will decrease Outcome: Progressing

## 2022-06-29 NOTE — TOC Initial Note (Signed)
Transition of Care [facility]) - Initial/Assessment Note    Patient Details  Name: Darryl Frye MRN: [id number] Date of Birth: Jul 03, 1961  [facility] CM/SW Contact:    Erenest Rasher, RN Phone Number: [phone] 06/29/2022, 4:26 PM  Clinical Narrative:                  HF TOC CM spoke to pt at bedside. States his wife and friend will assist at home. Pt lives alone. Drives to his appts. Pt has a scale and cane at home.     Expected Discharge Plan: Home/Self Care Barriers to Discharge: Continued Medical Work up   Patient Goals and CMS Choice Patient states their goals for this hospitalization and ongoing recovery are:: wants to remain independent      Expected Discharge Plan and Services Expected Discharge Plan: Home/Self Care   Discharge Planning Services: CM Consult   Living arrangements for the past 2 months: Apartment                                      Prior Living Arrangements/Services Living arrangements for the past 2 months: Apartment   Patient language and need for interpreter reviewed:: Yes Do you feel safe going back to the place where you live?: Yes        Care giver support system in place?: Yes (comment) Current home services: DME (cane, scale) Criminal Activity/Legal Involvement Pertinent to Current Situation/Hospitalization: No - Comment as needed  Activities of Daily Living      Permission Sought/Granted Permission sought to share information with : Case Manager, Family Supports, PCP    Share Information with NAME: Naseem Varden     Permission granted to share info w Relationship: ex-wife  Permission granted to share info w Contact Information: [phone]  Emotional Assessment Appearance:: Appears stated age Attitude/Demeanor/Rapport: Engaged Affect (typically observed): Accepting Orientation: : Oriented to Self, Oriented to Place, Oriented to  Time   Psych Involvement: No (comment)  Admission diagnosis:  CHF  (congestive heart failure) ([place]) [I50.9] Patient Active Problem List   Diagnosis Date Noted   CHF (congestive heart failure) ([place]) 06/27/2022   History of colonic polyps 10/24/2021   Heart failure with reduced ejection fraction ([place]) 10/24/2021   Heartburn 10/24/2021   Atrial fibrillation with RVR ([place]) 10/14/2021   Anticoagulated by anticoagulation treatment 07/30/2021   BMI 29.0-29.9,adult 07/30/2021   Depression with anxiety 07/30/2021   Dizziness 07/30/2021   S/P ICD (internal cardiac defibrillator) procedure 07/30/2021   SOB (shortness of breath) 07/30/2021   COVID-19 04/09/2021   Essential hypertension 04/09/2021   ICD (implantable cardioverter-defibrillator) discharge 04/09/2021   Atrial flutter, paroxysmal ([place]) 04/08/2021   History of crack cocaine use 05/17/2020   History of smoking 25-50 pack years 05/17/2020   Uncontrolled type 2 diabetes mellitus with hyperglycemia, without [place]-term current use of insulin ([place]) 05/17/2020   NICM (nonischemic cardiomyopathy) ([place]) 04/13/2020   Personal history of other medical treatment 03/29/2020   DOE (dyspnea on exertion) 12/12/2019   Acute respiratory failure ([place]) 09/28/2019   DM (diabetes mellitus), type 2 ([place]) 08/24/2019   DVT, bilateral lower limbs ([place]) 08/24/2019   ESRD (end stage renal disease) ([place]) 08/24/2019   Prolonged Q-T interval on ECG 08/24/2019   Lung nodule 08/22/2019   Chronic congestive heart failure ([place]) 97/94/8016   Chronic systolic CHF (congestive heart failure) ([place]) 08/01/2019   AKI (acute  kidney injury) ([place]) 07/08/2019   Atrial fibrillation/flutter 07/08/2019   Paroxysmal atrial fibrillation ([place]) 07/08/2019   Cardiogenic shock ([place]) 07/08/2019   Shock liver 07/08/2019   S/P cardiac cath 08/21/2018   PCP:  [name], FNP Pharmacy:   [facility] [place] [address] [place] [address] Phone:  [phone] Fax: [phone]     Social Determinants of Health (SDOH) Interventions    Readmission Risk Interventions     No data to display

## 2022-06-29 NOTE — Evaluation (Signed)
Physical Therapy Evaluation Patient Details Name: Darryl Frye MRN: [id number] DOB: 01-07-1961 Today's Date: 06/29/2022  History of Present Illness  Pt is a 61 y.o. male presenting 06/27/22 with CHF for RHC; admitted for inotrope and diuresis. Workup in progress for LVAD. Other PMH includes NICM s/p ICD, aflutter, CAD, DM, DVT, HTN, heart murmur, renal disorder.   Clinical Impression  Patient evaluated by Physical Therapy with no further acute PT needs identified. All education has been completed and the patient has no further questions. Pt ambulated supervision 540 ft during 6MWT with no SOB or signs of fatigue. See below for any follow-up Physical Therapy or equipment needs. PT is signing off, please re-order after LVAD procedure when medically appropriate. VSS on 2L [place]   Recommendations for follow up therapy are one component of a multi-disciplinary discharge planning process, led by the attending physician.  Recommendations may be updated based on patient status, additional functional criteria and insurance authorization.  Follow Up Recommendations No PT follow up      Assistance Recommended at Discharge PRN  Patient can return home with the following  Assist for transportation    Equipment Recommendations Rollator (4 wheels)  Recommendations for Other Services       Functional Status Assessment Patient has not had a recent decline in their functional status     Precautions / Restrictions Precautions Precautions: Fall Restrictions Weight Bearing Restrictions: No      Mobility  Bed Mobility Overal bed mobility: Needs Assistance Bed Mobility: Supine to Sit, Sit to Supine     Supine to sit: Modified independent (Device/Increase time) Sit to supine: Modified independent (Device/Increase time)   General bed mobility comments: mod independent for bed mobility with use of handrails, likely use for ease and convience but ultimately not necessary. pt able to sit EOB and  manage urinal independently    Transfers Overall transfer level: Needs assistance Equipment used: Rollator (4 wheels) Transfers: Sit to/from Stand Sit to Stand: Modified independent (Device/Increase time)           General transfer comment: cues for hand placement during ascent    Ambulation/Gait Ambulation/Gait assistance: Modified independent (Device/Increase time) Gait Distance (Feet): 540 Feet Assistive device: Rollator (4 wheels) Gait Pattern/deviations: Step-through pattern, Decreased stride length Gait velocity: decreased     General Gait Details: slow cautious gait with regular rhythm and no rest breaks needed, able to negotiate turns with rollator with minimal cues  Stairs            Wheelchair Mobility    Modified Rankin (Stroke Patients Only)       Balance Overall balance assessment: No apparent balance deficits (not formally assessed)                                           Pertinent Vitals/Pain Pain Assessment Pain Assessment: No/denies pain    Home Living Family/patient expects to be discharged to:: Private residence Living Arrangements: Alone Available Help at Discharge: Neighbor;Family;Friend(s) Type of Home: Apartment (3rd floor unit) Home Access: Elevator       Home Layout: One level Home Equipment: Cane - single point      Prior Function Prior Level of Function : Independent/Modified Independent;Driving             Mobility Comments: independent with mobility, intermittent use of SPC for ambulation ADLs Comments: independent for IADLS, self care, was  working part time up until a few months ago, had to stop for medical reasons     Hand Dominance        Extremity/Trunk Assessment   Upper Extremity Assessment Upper Extremity Assessment: Overall WFL for tasks assessed (functional during mobility)    Lower Extremity Assessment Lower Extremity Assessment: Overall WFL for tasks assessed (functional  during mobility)    Cervical / Trunk Assessment Cervical / Trunk Assessment: Normal  Communication   Communication: No difficulties  Cognition Arousal/Alertness: Awake/alert Behavior During Therapy: WFL for tasks assessed/performed Overall Cognitive Status: Within Functional Limits for tasks assessed                                 General Comments: pt exhibited great ability to dual task during session, answered questions appropriately, understanding of why 6MWT necessary        General Comments General comments (skin integrity, edema, etc.): VSS during mobility    Exercises     Assessment/Plan    PT Assessment Patient does not need any further PT services  PT Problem List         PT Treatment Interventions      PT Goals (Current goals can be found in the Care Plan section)  Acute Rehab PT Goals Patient Stated Goal: to get healthy PT Goal Formulation: With patient Time For Goal Achievement: 07/13/22 Potential to Achieve Goals: Good    Frequency       Co-evaluation               AM-PAC PT "6 Clicks" Mobility  Outcome Measure Help needed turning from your back to your side while in a flat bed without using bedrails?: None Help needed moving from lying on your back to sitting on the side of a flat bed without using bedrails?: None Help needed moving to and from a bed to a chair (including a wheelchair)?: None Help needed standing up from a chair using your arms (e.g., wheelchair or bedside chair)?: None Help needed to walk in hospital room?: None Help needed climbing 3-5 steps with a railing? : A Little 6 Click Score: 23    End of Session Equipment Utilized During Treatment: Gait belt;Oxygen Activity Tolerance: Patient tolerated treatment well Patient left: in bed;with call bell/phone within reach Nurse Communication: Mobility status PT Visit Diagnosis: Other abnormalities of gait and mobility (R26.89)    Time: 0947-0962 PT Time  Calculation (min) (ACUTE ONLY): 28 min   Charges:   PT Evaluation $PT Eval Low Complexity: 1 Low         Chipper Oman, SPT   Darryl Frye 06/29/2022, 5:41 PM

## 2022-06-29 NOTE — Progress Notes (Addendum)
Patient ID: Darryl Frye, male   DOB: 15-Apr-1961, 61 y.o.   MRN: [id number]     Advanced Heart Failure Rounding Note  PCP-Cardiologist: Carlyle Dolly, MD   Subjective:    Feels better this morning.  Co-ox on milrinone 0.25 was 52%?.  CVP <5 this morning.  I/Os net negative 2.3L, weight stable    He is in NSR on amiodarone gtt. Lasix gtt stopped yesterday.   [facility] Procedural Findings: Hemodynamics (mmHg) RA mean 14 RV 49/24 PA 66/53, mean 54 PCWP mean 43 Oxygen saturations: PA 54% AO 93% Cardiac Output (Fick) 3.4  Cardiac Index (Fick) 1.62 PVR 3.2 WU Cardiac Output (Thermo) 2.75 Cardiac Index (Thermo) 1.31  PAPI 0.93    Objective:   Weight Range: 89.3 kg Body mass index is 27.46 kg/m.   Vital Signs:   Temp:  [97.7 F (36.5 C)-98.5 F (36.9 C)] 97.8 F (36.6 C) (11/09 0401) Pulse Rate:  [68-91] 78 (11/09 0401) Resp:  [16-20] 18 (11/09 0401) BP: (92-106)/(60-84) 98/84 (11/09 0401) SpO2:  [91 %-96 %] 93 % (11/09 0401) Weight:  [89.3 kg] 89.3 kg (11/09 0401) Last BM Date : 06/26/22  Weight change: Filed Weights   06/27/22 1445 06/28/22 0623 06/29/22 0401  Weight: 91.1 kg 88.5 kg 89.3 kg    Intake/Output:   Intake/Output Summary (Last 24 hours) at 06/29/2022 0702 Last data filed at 06/29/2022 0430 Gross per 24 hour  Intake 1785.23 ml  Output 4075 ml  Net -2289.77 ml      Physical Exam   CVP <5 General:  well appearing.  No respiratory difficulty HEENT: normal Neck: supple. no JVD. Carotids 2+ bilat; no bruits. No lymphadenopathy or thyromegaly appreciated. Cor: PMI nondisplaced. Regular rate & rhythm. No rubs, gallops or murmurs. Lungs: clear Abdomen: soft, nontender, nondistended. No hepatosplenomegaly. No bruits or masses. Good bowel sounds. Extremities: no cyanosis, clubbing, rash, edema. PICC RUE Neuro: alert & oriented x 3, cranial nerves grossly intact. moves all 4 extremities w/o difficulty. Affect pleasant.   Telemetry   NSR 70s  6-13PVCs/min (personally reviewed)  Labs    CBC Recent Labs    06/27/22 1450 06/27/22 1511  WBC  --  6.6  HGB 16.3 15.4  HCT 48.0 47.5  MCV  --  87.0  PLT  --  916   Basic Metabolic Panel Recent Labs    06/27/22 1825 06/28/22 0445 06/29/22 0410  NA  --  136 131*  K  --  3.9 3.7  CL  --  98 94*  CO2  --  27 25  GLUCOSE  --  175* 157*  BUN  --  20 29*  CREATININE  --  1.23 1.50*  CALCIUM  --  9.0 8.5*  MG 2.0  --  2.5*   Liver Function Tests Recent Labs    06/27/22 1511  AST 20  ALT 19  ALKPHOS 117  BILITOT 0.7  PROT 7.1  ALBUMIN 3.8   No results for input(s): "LIPASE", "AMYLASE" in the last 72 hours. Cardiac Enzymes No results for input(s): "CKTOTAL", "CKMB", "CKMBINDEX", "TROPONINI" in the last 72 hours.  BNP: BNP (last 3 results) Recent Labs    06/11/22 1706 06/22/22 1520 06/27/22 1511  BNP 414.0* 495.4* 333.8*    ProBNP (last 3 results) No results for input(s): "PROBNP" in the last 8760 hours.   D-Dimer No results for input(s): "DDIMER" in the last 72 hours. Hemoglobin A1C Recent Labs    06/27/22 1511  HGBA1C 9.3*   Fasting Lipid  Panel Recent Labs    06/27/22 1825  CHOL 212*  HDL 55  LDLCALC 130*  TRIG 134  CHOLHDL 3.9   Thyroid Function Tests Recent Labs    06/27/22 1511  TSH 2.576    Other results:   Imaging    ECHOCARDIOGRAM COMPLETE  Result Date: 06/28/2022    ECHOCARDIOGRAM REPORT   Patient Name:   Darryl Frye Gonce Date of Exam: 06/28/2022 Medical Rec #:  [id number]      Height:       71.0 in Accession #:    [id number]     Weight:       195.1 lb Date of Birth:  October 15, 1960      BSA:          2.086 m Patient Age:    75 years       BP:           110/86 mmHg Patient Gender: M              HR:           63 bpm. Exam Location:  Inpatient Procedure: 2D Echo, Cardiac Doppler, Color Doppler and Intracardiac            Opacification Agent Indications:    CHF-Acute Systolic X44.81  History:        Patient has prior history of  Echocardiogram examinations, most                 recent 10/15/2021. CHF and Cardiomyopathy, ICD, Arrythmias:Atrial                 Flutter and Atrial Fibrillation, Signs/Symptoms:Shortness of                 Breath and Dyspnea; Risk Factors:Former Smoker.  Sonographer:    Greer Pickerel Referring Phys: [place] Comments: Image acquisition challenging due to respiratory motion and Image acquisition challenging due to patient body habitus. IMPRESSIONS  1. Left ventricular ejection fraction, by estimation, is <20%. The left ventricle has severely decreased function. The left ventricle demonstrates global hypokinesis. The left ventricular internal cavity size was mildly dilated. Left ventricular diastolic parameters are consistent with Grade II diastolic dysfunction (pseudonormalization).  2. Right ventricular systolic function is mildly reduced. The right ventricular size is normal. Tricuspid regurgitation signal is inadequate for assessing PA pressure.  3. Left atrial size was mildly dilated.  4. The mitral valve is normal in structure. No evidence of mitral valve regurgitation. No evidence of mitral stenosis.  5. The aortic valve is tricuspid. Aortic valve regurgitation is not visualized. No aortic stenosis is present.  6. Aortic dilatation noted. There is mild dilatation of the aortic root, measuring 41 mm.  7. The inferior vena cava is normal in size with greater than 50% respiratory variability, suggesting right atrial pressure of 3 mmHg. FINDINGS  Left Ventricle: Left ventricular ejection fraction, by estimation, is <20%. The left ventricle has severely decreased function. The left ventricle demonstrates global hypokinesis. Definity contrast agent was given IV to delineate the left ventricular endocardial borders. The left ventricular internal cavity size was mildly dilated. There is no left ventricular hypertrophy. Left ventricular diastolic parameters are consistent with Grade II  diastolic dysfunction (pseudonormalization). Right Ventricle: The right ventricular size is normal. No increase in right ventricular wall thickness. Right ventricular systolic function is mildly reduced. Tricuspid regurgitation signal is inadequate for assessing PA pressure. Left Atrium: Left atrial size was mildly dilated. Right Atrium: Right  atrial size was normal in size. Pericardium: Trivial pericardial effusion is present. Mitral Valve: The mitral valve is normal in structure. No evidence of mitral valve regurgitation. No evidence of mitral valve stenosis. Tricuspid Valve: The tricuspid valve is normal in structure. Tricuspid valve regurgitation is not demonstrated. Aortic Valve: The aortic valve is tricuspid. Aortic valve regurgitation is not visualized. No aortic stenosis is present. Pulmonic Valve: The pulmonic valve was normal in structure. Pulmonic valve regurgitation is not visualized. Aorta: Aortic dilatation noted. There is mild dilatation of the aortic root, measuring 41 mm. Venous: The inferior vena cava is normal in size with greater than 50% respiratory variability, suggesting right atrial pressure of 3 mmHg. IAS/Shunts: No atrial level shunt detected by color flow Doppler. Additional Comments: A device lead is visualized in the right ventricle.  LEFT VENTRICLE PLAX 2D LVIDd:         6.00 cm   Diastology LVIDs:         5.10 cm   LV e' medial:    6.18 cm/s LV PW:         0.80 cm   LV E/e' medial:  10.3 LV IVS:        0.70 cm   LV e' lateral:   4.53 cm/s LVOT diam:     1.90 cm   LV E/e' lateral: 14.1 LV SV:         32 LV SV Index:   15 LVOT Area:     2.84 cm  RIGHT VENTRICLE RV S prime:     11.70 cm/s TAPSE (M-mode): 2.2 cm LEFT ATRIUM           Index        RIGHT ATRIUM           Index LA diam:      3.80 cm 1.82 cm/m   RA Area:     17.15 cm LA Vol (A2C): 45.4 ml 21.76 ml/m  RA Volume:   44.85 ml  21.50 ml/m LA Vol (A4C): 58.2 ml 27.89 ml/m  AORTIC VALVE LVOT Vmax:   69.00 cm/s LVOT Vmean:   40.400 cm/s LVOT VTI:    0.114 m  AORTA Ao Root diam: 4.10 cm Ao Asc diam:  3.50 cm MITRAL VALVE MV Area (PHT): 3.89 cm    SHUNTS MV Decel Time: 195 msec    Systemic VTI:  0.11 m MR Peak grad: 3.7 mmHg     Systemic Diam: 1.90 cm MR Mean grad: 2.0 mmHg MR Vmax:      96.80 cm/s MR Vmean:     72.6 cm/s MV E velocity: 63.70 cm/s MV A velocity: 53.20 cm/s MV E/A ratio:  1.20 Kumar Falwell McleanMD Electronically signed by Franki Monte Signature Date/Time: 06/28/2022/6:15:15 PM    Final    VAS [place] LOWER EXTREMITY VENOUS (DVT)  Result Date: 06/28/2022  Lower Venous DVT Study Patient Name:  AMORI COLOMB Hole  Date of Exam:   06/28/2022 Medical Rec #: [id number]       Accession #:    [id number] Date of Birth: 12-28-1960       Patient Gender: M Patient Age:   66 years Exam Location:  [facility] Procedure:      VAS [place] LOWER EXTREMITY VENOUS (DVT) Referring Phys: Loralie Champagne --------------------------------------------------------------------------------  Indications: Pre-VAD.  Comparison Study: No prior studies. Performing Technologist: Darlin Coco RDMS, RVT  Examination Guidelines: A complete evaluation includes B-mode imaging, spectral Doppler, color Doppler, and power Doppler as needed of all accessible  portions of each vessel. Bilateral testing is considered an integral part of a complete examination. Limited examinations for reoccurring indications may be performed as noted. The reflux portion of the exam is performed with the patient in reverse Trendelenburg.  +---------+---------------+---------+-----------+----------+--------------+ RIGHT    CompressibilityPhasicitySpontaneityPropertiesThrombus Aging +---------+---------------+---------+-----------+----------+--------------+ CFV      Full           Yes      Yes                                 +---------+---------------+---------+-----------+----------+--------------+ SFJ      Full                                                         +---------+---------------+---------+-----------+----------+--------------+ FV Prox  Full                                                        +---------+---------------+---------+-----------+----------+--------------+ FV Mid   Full                                                        +---------+---------------+---------+-----------+----------+--------------+ FV DistalFull                                                        +---------+---------------+---------+-----------+----------+--------------+ PFV      Full                                                        +---------+---------------+---------+-----------+----------+--------------+ POP      Full           Yes      Yes                                 +---------+---------------+---------+-----------+----------+--------------+ PTV      Full                                                        +---------+---------------+---------+-----------+----------+--------------+ PERO     Full                                                        +---------+---------------+---------+-----------+----------+--------------+ Gastroc  Full                                                        +---------+---------------+---------+-----------+----------+--------------+   +---------+---------------+---------+-----------+----------+--------------+  LEFT     CompressibilityPhasicitySpontaneityPropertiesThrombus Aging +---------+---------------+---------+-----------+----------+--------------+ CFV      Full           Yes      Yes                                 +---------+---------------+---------+-----------+----------+--------------+ SFJ      Full                                                        +---------+---------------+---------+-----------+----------+--------------+ FV Prox  Full                                                         +---------+---------------+---------+-----------+----------+--------------+ FV Mid   Full                                                        +---------+---------------+---------+-----------+----------+--------------+ FV DistalFull                                                        +---------+---------------+---------+-----------+----------+--------------+ PFV      Full                                                        +---------+---------------+---------+-----------+----------+--------------+ POP      Full           Yes      Yes                                 +---------+---------------+---------+-----------+----------+--------------+ PTV      Full                                                        +---------+---------------+---------+-----------+----------+--------------+ PERO     Full                                                        +---------+---------------+---------+-----------+----------+--------------+ Gastroc  Full                                                        +---------+---------------+---------+-----------+----------+--------------+  Summary: RIGHT: - There is no evidence of deep vein thrombosis in the lower extremity.  - No cystic structure found in the popliteal fossa.  LEFT: - There is no evidence of deep vein thrombosis in the lower extremity.  - No cystic structure found in the popliteal fossa.  *See table(s) above for measurements and observations. Electronically signed by Jamelle Haring on 06/28/2022 at 3:41:10 PM.    Final    VAS US DOPPLER PRE VAD  Result Date: 06/28/2022 PERIOPERATIVE VASCULAR EVALUATION Patient Name:  RAMAN FEATHERSTON  Date of Exam:   06/28/2022 Medical Rec #: [id number]       Accession #:    [id number] Date of Birth: [birth year]       Patient Gender: M Patient Age:   36 years Exam Location:  [facility] Procedure:      VAS US DOPPLER PRE VAD Referring Phys: Loralie Champagne  --------------------------------------------------------------------------------  Indications:      Pre-VAD. Limitations:      Bandaging/lines upper arms Comparison Study: No prior studies. Performing Technologist: Darlin Coco RDMS RVT  Examination Guidelines: A complete evaluation includes B-mode imaging, spectral Doppler, color Doppler, and power Doppler as needed of all accessible portions of each vessel. Bilateral testing is considered an integral part of a complete examination. Limited examinations for reoccurring indications may be performed as noted.  Right Carotid Findings: +----------+--------+--------+--------+-----------------+------------------+           PSV cm/sEDV cm/sStenosisDescribe         Comments           +----------+--------+--------+--------+-----------------+------------------+ CCA Prox  65      10                                                  +----------+--------+--------+--------+-----------------+------------------+ CCA Distal60      14                               intimal thickening +----------+--------+--------+--------+-----------------+------------------+ ICA Prox  43      10              heterogenous mild                   +----------+--------+--------+--------+-----------------+------------------+ ICA Mid   27      11                                                  +----------+--------+--------+--------+-----------------+------------------+ ICA Distal47      21                                                  +----------+--------+--------+--------+-----------------+------------------+ ECA       61      10                                                  +----------+--------+--------+--------+-----------------+------------------+ +----------+--------+-------+----------------+------------+  PSV cm/sEDV cmsDescribe        Arm Pressure +----------+--------+-------+----------------+------------+ Subclavian106             Multiphasic, WNL             +----------+--------+-------+----------------+------------+ +---------+--------+--+--------+-+---------+ VertebralPSV cm/s30EDV cm/s8Antegrade +---------+--------+--+--------+-+---------+ Left Carotid Findings: +----------+--------+--------+--------+------------+------------------+           PSV cm/sEDV cm/sStenosisDescribe    Comments           +----------+--------+--------+--------+------------+------------------+ CCA Prox  82      17      <50%    heterogenous                   +----------+--------+--------+--------+------------+------------------+ CCA Distal45      17                                             +----------+--------+--------+--------+------------+------------------+ ICA Prox  44      19                          intimal thickening +----------+--------+--------+--------+------------+------------------+ ICA Mid   44      19                                             +----------+--------+--------+--------+------------+------------------+ ICA Distal43      17                                             +----------+--------+--------+--------+------------+------------------+ ECA       69      10                                             +----------+--------+--------+--------+------------+------------------+ +----------+--------+--------+----------------+------------+ SubclavianPSV cm/sEDV cm/sDescribe        Arm Pressure +----------+--------+--------+----------------+------------+           156             Multiphasic, WNL97           +----------+--------+--------+----------------+------------+ +---------+--------+--+--------+--+---------+ VertebralPSV cm/s39EDV cm/s12Antegrade +---------+--------+--+--------+--+---------+  ABI Findings: +---------+------------------+-----+---------+---------------------------------+ Right    Rt Pressure (mmHg)IndexWaveform Comment                            +---------+------------------+-----+---------+---------------------------------+ Brachial                        triphasicUnable to obtain pressure                                                  secondary to line                 +---------+------------------+-----+---------+---------------------------------+ PTA      114               1.18 triphasic                                  +---------+------------------+-----+---------+---------------------------------+  DP       101               1.04 triphasic                                  +---------+------------------+-----+---------+---------------------------------+ Great Toe75                     Normal                                     +---------+------------------+-----+---------+---------------------------------+ +---------+------------------+-----+---------+-------+ Left     Lt Pressure (mmHg)IndexWaveform Comment +---------+------------------+-----+---------+-------+ Brachial 97                     triphasic        +---------+------------------+-----+---------+-------+ PTA      105               1.08 triphasic        +---------+------------------+-----+---------+-------+ DP       112               1.15 triphasic        +---------+------------------+-----+---------+-------+ Luz Lex                     Normal           +---------+------------------+-----+---------+-------+  Summary: Right Carotid: The extracranial vessels were near-normal with only minimal wall                thickening or plaque. Left Carotid: The extracranial vessels were near-normal with only minimal wall               thickening or plaque. Vertebrals:  Bilateral vertebral arteries demonstrate antegrade flow. Subclavians: Normal flow hemodynamics were seen in bilateral subclavian              arteries.  *See table(s) above for measurements and observations. Right ABI: Resting right ankle-brachial index is within normal  range. The right toe-brachial index is normal. Left ABI: Resting left ankle-brachial index is within normal range. The left toe-brachial index is normal.  Electronically signed by Jamelle Haring on 06/28/2022 at 3:37:59 PM.    Final    CT CHEST ABDOMEN PELVIS WO CONTRAST  Result Date: 06/28/2022 CLINICAL DATA:  Evaluate for metastatic disease EXAM: CT CHEST, ABDOMEN AND PELVIS WITHOUT CONTRAST TECHNIQUE: Multidetector CT imaging of the chest, abdomen and pelvis was performed following the standard protocol without IV contrast. RADIATION DOSE REDUCTION: This exam was performed according to the departmental dose-optimization program which includes automated exposure control, adjustment of the mA and/or kV according to patient size and/or use of iterative reconstruction technique. COMPARISON:  CT chest done on 01/26/2021 and chest radiographs done on 06/28/2022 FINDINGS: CT CHEST FINDINGS Cardiovascular: Small pericardial effusion is seen. Pacemaker/defibrillator battery is seen in left infraclavicular region with tips of leads in right atrium and right ventricle. There are scattered coronary artery calcifications. Main pulmonary artery measures 3.3 cm suggesting possible pulmonary arterial hypertension. Tip of PICC line is seen in superior vena cava. Mediastinum/Nodes: There are slightly enlarged lymph nodes in mediastinum largest in the subcarinal region measuring 1.3 cm in short axis. There is interval increase in size of mediastinal lymph nodes. There is fluid in the lumen of thoracic esophagus suggesting gastroesophageal reflux. Lungs/Pleura: There are small linear patchy  infiltrates in mid and lower lung fields. This finding is more prominent in right lower lobe. No discrete lung nodules are seen. Small right pleural effusion is seen. Minimal left pleural effusion is seen. There is no pneumothorax. Musculoskeletal: Degenerative changes are noted in the visualized lower cervical spine with encroachment of neural  foramina. CT ABDOMEN PELVIS FINDINGS Hepatobiliary: No focal abnormalities are seen in liver. Evaluation is limited in this noncontrast study. There is no dilation of bile ducts. Gallbladder is unremarkable. Pancreas: No focal abnormalities are seen. Spleen: Unremarkable. Adrenals/Urinary Tract: There is 1.3 cm nodule in left adrenal which has not changed significantly. Density measurements are less than 5 Hounsfield units suggesting adenoma. No follow-up imaging is recommended. There is no hydronephrosis. There are no renal or ureteral stones. There is perinephric stranding. Ureters are not dilated. Urinary bladder shows no focal wall thickening. Right anterior aspect of the urinary bladder is extending into right inguinal hernia. In coronal reconstruction images, there is 7 mm high density in the lower pole of right kidney, possibly hemorrhagic or proteinaceous cyst. Stomach/Bowel: Small hiatal hernia is seen. Stomach is unremarkable. Small bowel loops are not dilated. Appendix is not seen. There is no significant wall thickening in colon. Scattered diverticula are seen in colon without signs of focal acute diverticulitis. There is mild diffuse wall thickening in rectum. There is no perirectal stranding. Vascular/Lymphatic: Scattered arterial calcifications are seen. There are few subcentimeter nodes in retroperitoneum and mesentery. Reproductive: Prostate is enlarged. Other: There is no ascites or pneumoperitoneum. There is right inguinal hernia containing fat and portion of urinary bladder. Musculoskeletal: No acute findings are seen. There are no focal lytic or sclerotic lesions. Degenerative changes are noted at the L5-S1 level with encroachment of neural foramina. IMPRESSION: There are linear patchy infiltrates in both mid and lower lung fields suggesting subsegmental atelectasis/pneumonia. Small right pleural effusion. Minimal left pleural effusion. Small pericardial effusion. There are slightly enlarged  lymph nodes in mediastinum largest measuring 1.3 cm in short axis. There is slight interval increase in size of mediastinal lymph nodes. Findings may be due to reactive hyperplasia or inflammatory or neoplastic process. Follow-up CT in 3 months may be considered. There are no discrete lung nodules. There are no signs of metastatic disease in abdomen and pelvis. There is no evidence of intestinal obstruction or pneumoperitoneum. There is no hydronephrosis. There is stranding in the fat planes around both kidneys which may be residual from previous obstruction or previous inflammation. There is fluid in the lumen of thoracic esophagus suggesting gastroesophageal reflux. Small hiatal hernia is seen. Scattered diverticula are seen in colon without signs of focal diverticulitis. There is right inguinal hernia containing fat and portion of urinary bladder. Possible 7 mm hyperdense cyst is seen in the lower pole of right kidney. Degenerative changes are noted in lower cervical spine and lower lumbar spine. There are no imaging signs of metastatic disease in the bony structures. Possible left adrenal adenoma. Aortic arteriosclerosis. Coronary artery calcifications are seen. Other findings as described in the body of the report. Electronically Signed   By: Elmer Picker M.D.   On: 06/28/2022 12:20     Medications:     Scheduled Medications:  apixaban  5 mg Oral BID   atorvastatin  20 mg Oral Daily   Chlorhexidine Gluconate Cloth  6 each Topical Daily   Chlorhexidine Gluconate Cloth  6 each Topical Q0600   digoxin  0.125 mg Oral Daily   empagliflozin  25 mg Oral Daily  insulin aspart  0-15 Units Subcutaneous TID WC   insulin aspart  0-5 Units Subcutaneous QHS   insulin glargine-yfgn  30 Units Subcutaneous Daily   levothyroxine  25 mcg Oral Q0600   magnesium oxide  400 mg Oral BID   mupirocin ointment  1 Application Nasal BID   sacubitril-valsartan  1 tablet Oral BID   sertraline  50 mg Oral Daily    sodium chloride flush  10-40 mL Intracatheter Q12H   sodium chloride flush  3 mL Intravenous Q12H   spironolactone  25 mg Oral Daily    Infusions:  sodium chloride     sodium chloride 10 mL/hr at 06/29/22 0423   amiodarone 30 mg/hr (06/29/22 0400)   milrinone 0.25 mcg/kg/min (06/29/22 0400)    PRN Medications: sodium chloride, acetaminophen, acetaminophen, ondansetron (ZOFRAN) IV, sodium chloride flush, sodium chloride flush   Assessment/Plan   1. Acute on chronic systolic CHF: Nonischemic cardiomyopathy, diagnosed 2020.  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  Cath in 12/20 with no significant coronary disease.  Medtronic ICD. Most recent echo in 2/23 showed EF 20-25% with normal RV.  Recently, patient has been symptomatically worse, NYHA class IV with profound orthopnea. RHC showed markedly elevated filling pressures, primarily pulmonary venous hypertension, low cardiac output, and low PAPI.  Patient was admitted for inotrope and diuresis. Echo this admission with EF <20%, mild RV dysfunction.   He was started on milrinone, co-ox 52%? this morning. Breathing much better, diuresed well, CVP <5.   - Continue milrinone 0.25, this has helped him significantly.   - IV Lasix 12 mg/hr stopped yesterday.  CVP <5 but LVEDP/PCWP was elevated markedly out of proportion to RA pressure on cath.  -  consider starting PO diuretic post cath - Started digoxin 0.125 daily.  - Continue Entresto 24/26 bid for now, watch BP.  - Continue spironolactone 25 daily.  - Continue Jardiance.  - Hold beta blocker with low output/volume overload.  - Narrow QRS, not CRT candidate.  - I think that at this point patient needs evaluation for LVAD placement.  He is significantly improved on milrinone gtt and may be able to go home on  milrinone while we are deciding on LVAD and timing.  He may have trouble finding someone to stay with him post-op, will need to discuss.  - Pending repeat RHC on milrinone today to make sure PA pressure/PCWP look better.  2. Atrial fibrillation/flutter: S/p flutter ablation.  He mainly had atrial flutter.  He is in NSR today.  - Continue apixaban.  - Continue amiodarone gtt while on milrinone to try to keep him in rhythm.  3. H/o DVT: He is on apixaban.   Length of Stay: [place] AGACNP-BC  06/29/2022, 7:02 AM  Advanced Heart Failure Team Pager [phone] (M-F; 7a - 5p)  Please contact [facility] for night-coverage after hours (5p -7a ) and weekends on [email or web address]  Patient seen with NP, agree with the above note.   RHC done today: Procedural Findings (milrinone 0.25): Hemodynamics (mmHg) RA mean 1 RV 18/4 PA 29/16, mean 21 PCWP mean 8 Oxygen saturations: PA 66% AO 95% Cardiac Output (Fick) 4.58  Cardiac Index (Fick) 2.19 PVR 2.8 WU PAPI 13   Patient feels much better on milrinone.  Good diuresis yesterday, weight down. Lasix  gtt stopped.  Creatinine bumped up to 1.5.   General: NAD Neck: No JVD, no thyromegaly or thyroid nodule.  Lungs: Clear to auscultation bilaterally with normal respiratory effort. CV: Lateral PMI.  Heart regular S1/S2, no S3/S4, no murmur.  No peripheral edema.    Abdomen: Soft, nontender, no hepatosplenomegaly, no distention.  Skin: Intact without lesions or rashes.  Neurologic: Alert and oriented x 3.  Psych: Normal affect. Extremities: No clubbing or cyanosis.  HEENT: Normal.   RHC today shows much improved filling pressures (now low) on milrinone 0.25 after diuresis.  CI still marginal at 2.19.  - Increase milrinone to 0.375.  He is now inotrope-dependent.  - Hold Entresto for now with SBP 90s and low CVP (and rise in creatinine).  - No Lasix today.  - LVAD workup ongoing.  I think he will be a candidate though social situation is  somewhat difficult (has ex-wife who is willing to help but with limitations and daughter who probably will not be able to help him).  I do not think that he would be able to get a transplant quickly give social situation and blood type O, also has history of substance abuse (though not recently).  I think our best option, rather than keeping him on [place]-term milrinone, would be to proceed with LVAD and then see if he would be a transplant candidate down the road. Will discuss with Dr Tenny Craw and team, LVAD next week may be an option.   Loralie Champagne 06/29/2022 8:31 AM

## 2022-06-29 NOTE — Progress Notes (Signed)
This chaplain is present with the Pt. for creating/updating the Pt. Advance Directive: HCPOA and Living Will. The Pt. completed AD and Living Will education, with the opportunity to ask the chaplain clarifying questions. Family is not at the bedside.  The chaplain understands the Pt. choices for HCPOA are [place] and [employer]. The HCPOAs will share healthcare decision making equally. The Pt. chose to complete a Living Will.  Witnesses are not available for notarizing the Pt. AD at this time. The chaplain will F/U on Friday.   Chaplain Sallyanne Kuster [phone]

## 2022-06-30 ENCOUNTER — Encounter (HOSPITAL_COMMUNITY): Payer: Medicaid Other

## 2022-06-30 ENCOUNTER — Inpatient Hospital Stay (HOSPITAL_COMMUNITY): Payer: Medicare Other

## 2022-06-30 DIAGNOSIS — I5023 Acute on chronic systolic (congestive) heart failure: Secondary | ICD-10-CM

## 2022-06-30 LAB — BASIC METABOLIC PANEL
Anion gap: 8 (ref 5–15)
BUN: 23 mg/dL — ABNORMAL HIGH (ref 6–20)
CO2: 25 mmol/L (ref 22–32)
Calcium: 8.4 mg/dL — ABNORMAL LOW (ref 8.9–10.3)
Chloride: 101 mmol/L (ref 98–111)
Creatinine, Ser: 1.2 mg/dL (ref 0.61–1.24)
GFR, Estimated: >60 mL/min (ref >60)
Glucose, Bld: 123 mg/dL — ABNORMAL HIGH (ref 70–99)
Potassium: 3.7 mmol/L (ref 3.5–5.1)
Sodium: 134 mmol/L — ABNORMAL LOW (ref 135–145)

## 2022-06-30 LAB — GLUCOSE, CAPILLARY
Glucose-Capillary: 141 mg/dL — ABNORMAL HIGH (ref 70–99)
Glucose-Capillary: 150 mg/dL — ABNORMAL HIGH (ref 70–99)
Glucose-Capillary: 204 mg/dL — ABNORMAL HIGH (ref 70–99)
Glucose-Capillary: 229 mg/dL — ABNORMAL HIGH (ref 70–99)

## 2022-06-30 LAB — PULMONARY FUNCTION TEST
DL/VA % pred: 79 %
DL/VA: 3.37 ml/min/mmHg/L
DLCO cor % pred: 62 %
DLCO cor: 17.99 ml/min/mmHg
DLCO unc % pred: 64 %
DLCO unc: 18.38 ml/min/mmHg
FEF 25-75 Post: 2.58 L/sec
FEF 25-75 Pre: 1.9 L/sec
FEF2575-%Change-Post: 35 %
FEF2575-%Pred-Post: 82 %
FEF2575-%Pred-Pre: 60 %
FEV1-%Change-Post: 9 %
FEV1-%Pred-Post: 74 %
FEV1-%Pred-Pre: 67 %
FEV1-Post: 2.8 L
FEV1-Pre: 2.56 L
FEV1FVC-%Change-Post: 10 %
FEV1FVC-%Pred-Pre: 96 %
FEV6-%Change-Post: 0 %
FEV6-%Pred-Post: 72 %
FEV6-%Pred-Pre: 71 %
FEV6-Post: 3.43 L
FEV6-Pre: 3.42 L
FEV6FVC-%Change-Post: 1 %
FEV6FVC-%Pred-Post: 104 %
FEV6FVC-%Pred-Pre: 102 %
FVC-%Change-Post: -1 %
FVC-%Pred-Post: 69 %
FVC-%Pred-Pre: 70 %
FVC-Post: 3.44 L
Post FEV1/FVC ratio: 81 %
Post FEV6/FVC ratio: 100 %
Pre FEV1/FVC ratio: 73 %
Pre FEV6/FVC Ratio: 98 %
RV % pred: 115 %
RV: 2.64 L
TLC % pred: 86 %
TLC: 6.24 L

## 2022-06-30 LAB — COOXEMETRY PANEL
Carboxyhemoglobin: 1.5 % (ref 0.5–1.5)
Methemoglobin: 0.7 % (ref 0.0–1.5)
O2 Saturation: 67.7 %
Total hemoglobin: 16.2 g/dL — ABNORMAL HIGH (ref 12.0–16.0)

## 2022-06-30 LAB — APTT: aPTT: 49 seconds — ABNORMAL HIGH (ref 24–36)

## 2022-06-30 MED ORDER — ALBUTEROL SULFATE (2.5 MG/3ML) 0.083% IN NEBU
2.5000 mg | INHALATION_SOLUTION | Freq: Once | RESPIRATORY_TRACT | Status: AC
  Administered 2022-06-30: 2.5 mg via RESPIRATORY_TRACT

## 2022-06-30 MED ORDER — POTASSIUM CHLORIDE CRYS ER 20 MEQ PO TBCR
20.0000 meq | EXTENDED_RELEASE_TABLET | Freq: Two times a day (BID) | ORAL | Status: AC
  Administered 2022-06-30 (×2): 20 meq via ORAL
  Filled 2022-06-30 (×2): qty 1

## 2022-06-30 MED ORDER — FUROSEMIDE 40 MG PO TABS
40.0000 mg | ORAL_TABLET | Freq: Two times a day (BID) | ORAL | Status: DC
  Administered 2022-06-30 – 2022-07-03 (×7): 40 mg via ORAL
  Filled 2022-06-30 (×7): qty 1

## 2022-06-30 MED ORDER — HEPARIN (PORCINE) 25000 UT/250ML-% IV SOLN
1700.0000 [IU]/h | INTRAVENOUS | Status: DC
  Administered 2022-06-30: 1200 [IU]/h via INTRAVENOUS
  Administered 2022-07-01 – 2022-07-02 (×2): 1500 [IU]/h via INTRAVENOUS
  Administered 2022-07-02 – 2022-07-04 (×3): 1700 [IU]/h via INTRAVENOUS
  Filled 2022-06-30 (×6): qty 250

## 2022-06-30 MED ORDER — SACUBITRIL-VALSARTAN 24-26 MG PO TABS
1.0000 | ORAL_TABLET | Freq: Two times a day (BID) | ORAL | Status: DC
  Administered 2022-06-30 – 2022-07-02 (×6): 1 via ORAL
  Filled 2022-06-30 (×6): qty 1

## 2022-06-30 NOTE — Progress Notes (Signed)
ANTICOAGULATION CONSULT NOTE  Pharmacy Consult for Heparin Indication: atrial fibrillation  No Known Allergies  Patient Measurements: Height: 5\' 11"  (180.3 cm) Weight: 91.2 kg (201 lb 1 oz) IBW/kg (Calculated) : 75.3 HEPARIN DW (KG): 91.1    Vital Signs: Temp: 98.5 F (36.9 C) (11/10 1955) Temp Source: Oral (11/10 1955) BP: 114/75 (11/10 1955)  Labs: Recent Labs    06/28/22 0445 06/29/22 0410 06/29/22 0812 06/29/22 0815 06/30/22 0510  HGB  --   --  17.7* 17.7*  --   HCT  --   --  52.0 52.0  --   CREATININE 1.23 1.50*  --   --  1.20     Estimated Creatinine Clearance: 76.6 mL/min (by C-G formula based on SCr of 1.2 mg/dL).   Medical History: Past Medical History:  Diagnosis Date   Acute kidney injury ([place])    Acute respiratory failure (HCC)    Atrial flutter (HCC)    on Eliquis   CHF (congestive heart failure) ([place])    EF 20-25% 10/15/21, AICD in place.   Coronary artery disease    Diabetes mellitus without complication (HCC)    DVT (deep venous thrombosis) ([place]) 06/2019   Heart murmur    Hypertension    Presence of permanent cardiac pacemaker    Renal disorder       Assessment: 61yom admitted with heart failure and low cardiac output now stable on milrinone 0.359mcg/kg/min. Planning LVAD implant next week. Patient also with atrial fibrillation on apixaban for anticoagulation.  Bridging with heparin pre-op.   -aPTT= 33 on 1200 units/hr  Goal of Therapy:  Heparin level 0.3-0.7 units/ml aPTT 66-103  seconds Monitor platelets by anticoagulation protocol: Yes   Plan:  -Increase heparin to 1500 units/hr -heparin level and CBC in am  Hildred Laser, PharmD Clinical Pharmacist **Pharmacist phone directory can now be found on [email or web address] ([email or web address]).  Listed under [place].

## 2022-06-30 NOTE — Plan of Care (Signed)
  Problem: Education: Goal: Understanding of CV disease, CV risk reduction, and recovery process will improve Outcome: Progressing Goal: Individualized Educational Video(s) Outcome: Progressing   Problem: Activity: Goal: Ability to return to baseline activity level will improve Outcome: Progressing   Problem: Cardiovascular: Goal: Ability to achieve and maintain adequate cardiovascular perfusion will improve Outcome: Progressing Goal: Vascular access site(s) Level 0-1 will be maintained Outcome: Progressing   Problem: Health Behavior/Discharge Planning: Goal: Ability to safely manage health-related needs after discharge will improve Outcome: Progressing   Problem: Education: Goal: Ability to describe self-care measures that may prevent or decrease complications (Diabetes Survival Skills Education) will improve Outcome: Progressing Goal: Individualized Educational Video(s) Outcome: Progressing   Problem: Coping: Goal: Ability to adjust to condition or change in health will improve Outcome: Progressing   Problem: Fluid Volume: Goal: Ability to maintain a balanced intake and output will improve Outcome: Progressing   Problem: Health Behavior/Discharge Planning: Goal: Ability to identify and utilize available resources and services will improve Outcome: Progressing Goal: Ability to manage health-related needs will improve Outcome: Progressing   Problem: Metabolic: Goal: Ability to maintain appropriate glucose levels will improve Outcome: Progressing   Problem: Nutritional: Goal: Maintenance of adequate nutrition will improve Outcome: Progressing Goal: Progress toward achieving an optimal weight will improve Outcome: Progressing   Problem: Skin Integrity: Goal: Risk for impaired skin integrity will decrease Outcome: Progressing   Problem: Tissue Perfusion: Goal: Adequacy of tissue perfusion will improve Outcome: Progressing   Problem: Education: Goal: Knowledge  of General Education information will improve Description: Including pain rating scale, medication(s)/side effects and non-pharmacologic comfort measures Outcome: Progressing   Problem: Health Behavior/Discharge Planning: Goal: Ability to manage health-related needs will improve Outcome: Progressing   Problem: Clinical Measurements: Goal: Ability to maintain clinical measurements within normal limits will improve Outcome: Progressing Goal: Will remain free from infection Outcome: Progressing Goal: Diagnostic test results will improve Outcome: Progressing Goal: Respiratory complications will improve Outcome: Progressing Goal: Cardiovascular complication will be avoided Outcome: Progressing   Problem: Activity: Goal: Risk for activity intolerance will decrease Outcome: Progressing   Problem: Nutrition: Goal: Adequate nutrition will be maintained Outcome: Progressing   Problem: Coping: Goal: Level of anxiety will decrease Outcome: Progressing   Problem: Elimination: Goal: Will not experience complications related to bowel motility Outcome: Progressing Goal: Will not experience complications related to urinary retention Outcome: Progressing   Problem: Pain Managment: Goal: General experience of comfort will improve Outcome: Progressing   Problem: Safety: Goal: Ability to remain free from injury will improve Outcome: Progressing

## 2022-06-30 NOTE — Progress Notes (Signed)
ANTICOAGULATION CONSULT NOTE - Initial Consult  Pharmacy Consult for Heparin Indication: atrial fibrillation  No Known Allergies  Patient Measurements: Height: 5\' 11"  (180.3 cm) Weight: 91.2 kg (201 lb 1 oz) IBW/kg (Calculated) : 75.3   Vital Signs: Temp: 97.8 F (36.6 C) (11/10 0720) Temp Source: Oral (11/10 0720) BP: 118/79 (11/10 0720) Pulse Rate: 74 (11/10 0720)  Labs: Recent Labs    06/27/22 1511 06/27/22 1825 06/28/22 0445 06/29/22 0410 06/29/22 0812 06/29/22 0815 06/30/22 0510  HGB 15.4  --   --   --  17.7* 17.7*  --   HCT 47.5  --   --   --  52.0 52.0  --   PLT 275  --   --   --   --   --   --   APTT  --  33  --   --   --   --   --   LABPROT  --  13.0  --   --   --   --   --   INR  --  1.0  --   --   --   --   --   CREATININE 1.02  --  1.23 1.50*  --   --  1.20    Estimated Creatinine Clearance: 76.6 mL/min (by C-G formula based on SCr of 1.2 mg/dL).   Medical History: Past Medical History:  Diagnosis Date   Acute kidney injury ([place])    Acute respiratory failure (HCC)    Atrial flutter (HCC)    on Eliquis   CHF (congestive heart failure) ([place])    EF 20-25% 10/15/21, AICD in place.   Coronary artery disease    Diabetes mellitus without complication (HCC)    DVT (deep venous thrombosis) ([place]) 06/2019   Heart murmur    Hypertension    Presence of permanent cardiac pacemaker    Renal disorder       Assessment: 61yom admitted with heart failure and low cardiac output now stable on milrinone 0.360mcg/kg/min. Planning LVAD implant next week.  Patient also with atrial fibrillation on apixaban for anticoagulation.  Will stop apixaban and allow for washout for surgery.  Will bridge with heparin pre-op.   Apixaban falsely elevates the heparin level, will dose heparin via aptt until they correlate.   Goal of Therapy:  Heparin level 0.3-0.7 units/ml aPTT 66-103  seconds Monitor platelets by anticoagulation protocol: Yes   Plan:  Stop apixaban   Begin heparin drip 1200 uts/hr  Check aptt in 6hr  Daily aptt, heparin level and cbc   Bonnita Nasuti Pharm.D. CPP, BCPS Clinical Pharmacist [phone] 06/30/2022 1:37 PM

## 2022-06-30 NOTE — Progress Notes (Signed)
Mobility Specialist Progress Note    06/30/22 1113  Mobility  Activity Ambulated with assistance in hallway  Level of Assistance Contact guard assist, steadying assist  Assistive Device Four wheel walker  Distance Ambulated (ft) 400 ft  Activity Response Tolerated well  Mobility Referral Yes  $Mobility charge 1 Mobility   Pre-Mobility: 81 HR, 96% SpO2 During Mobility: 100 HR Post-Mobility: 82 HR, 93% SpO2  Pt received in bed and agreeable. No complaints on walk. Returned to sitting EOB with call bell in reach.    Hildred Alamin Mobility Specialist  Please Psychologist, sport and exercise or Rehab Office at [phone]

## 2022-06-30 NOTE — Progress Notes (Addendum)
Patient ID: Darryl Frye, male   DOB: 1961/01/10, 61 y.o.   MRN: [id number]     Advanced Heart Failure Rounding Note  PCP-Cardiologist: Carlyle Dolly, MD   Subjective:    Milrinone increased to .375 yesterday, coox this morning 68%. CVP 6/7 , I&O -2.3L UOP  Entresto and diuretics held yesterday with bump in creatinine. 1.25>>1.5. Down to 1.2 today  He is in NSR on amiodarone gtt. Lasix gtt stopped 11/8  Eating breakfast, feels good this morning. Denies CP and SOB.   Repeat RHC 11/9: Procedural Findings (milrinone 0.25): Hemodynamics (mmHg) RA mean 1 RV 18/4 PA 29/16, mean 21 PCWP mean 8 Oxygen saturations: PA 66% AO 95% Cardiac Output (Fick) 4.58  Cardiac Index (Fick) 2.19 PVR 2.8 WU PAPI 13   RHC Procedural Findings 11/7: Hemodynamics (mmHg) RA mean 14 RV 49/24 PA 66/53, mean 54 PCWP mean 43 Oxygen saturations: PA 54% AO 93% Cardiac Output (Fick) 3.4  Cardiac Index (Fick) 1.62 PVR 3.2 WU Cardiac Output (Thermo) 2.75 Cardiac Index (Thermo) 1.31  PAPI 0.93   Objective:   Weight Range: 91.2 kg Body mass index is 28.04 kg/m.   Vital Signs:   Temp:  [97.8 F (36.6 C)-98.4 F (36.9 C)] 97.8 F (36.6 C) (11/10 0320) Pulse Rate:  [0-92] 68 (11/09 2317) Resp:  [14-21] 18 (11/09 2317) BP: (106-130)/(62-118) 111/79 (11/09 2317) SpO2:  [92 %-98 %] 96 % (11/09 2317) Weight:  [91.2 kg] 91.2 kg (11/10 0609) Last BM Date : 06/26/22  Weight change: Filed Weights   06/28/22 0623 06/29/22 0401 06/30/22 0609  Weight: 88.5 kg 89.3 kg 91.2 kg    Intake/Output:   Intake/Output Summary (Last 24 hours) at 06/30/2022 0703 Last data filed at 06/30/2022 0320 Gross per 24 hour  Intake 1360.53 ml  Output 2325 ml  Net -964.47 ml    Physical Exam   CVP <6/7 General:  well appearing.  No respiratory difficulty HEENT: normal Neck: supple. JVD ~6 cm. Carotids 2+ bilat; no bruits. No lymphadenopathy or thyromegaly appreciated. Cor: PMI nondisplaced. Regular rate  & rhythm. No rubs, gallops or murmurs. Lungs: clear Abdomen: soft, nontender, nondistended. No hepatosplenomegaly. No bruits or masses. Good bowel sounds. Extremities: no cyanosis, clubbing, rash, edema. PICC RUE Neuro: alert & oriented x 3, cranial nerves grossly intact. moves all 4 extremities w/o difficulty. Affect pleasant.   Telemetry   NSR 70s, brief runs NSVT, 0-1 PVCs/min (personally reviewed)  Labs    CBC Recent Labs    06/27/22 1511 06/29/22 0812 06/29/22 0815  WBC 6.6  --   --   HGB 15.4 17.7* 17.7*  HCT 47.5 52.0 52.0  MCV 87.0  --   --   PLT 275  --   --    Basic Metabolic Panel Recent Labs    06/27/22 1825 06/28/22 0445 06/29/22 0410 06/29/22 0812 06/29/22 0815 06/30/22 0510  NA  --    < > 131*   < > 134* 134*  K  --    < > 3.7   < > 3.8 3.7  CL  --    < > 94*  --   --  101  CO2  --    < > 25  --   --  25  GLUCOSE  --    < > 157*  --   --  123*  BUN  --    < > 29*  --   --  23*  CREATININE  --    < >  1.50*  --   --  1.20  CALCIUM  --    < > 8.5*  --   --  8.4*  MG 2.0  --  2.5*  --   --   --    < > = values in this interval not displayed.   Liver Function Tests Recent Labs    06/27/22 1511  AST 20  ALT 19  ALKPHOS 117  BILITOT 0.7  PROT 7.1  ALBUMIN 3.8   No results for input(s): "LIPASE", "AMYLASE" in the last 72 hours. Cardiac Enzymes No results for input(s): "CKTOTAL", "CKMB", "CKMBINDEX", "TROPONINI" in the last 72 hours.  BNP: BNP (last 3 results) Recent Labs    06/11/22 1706 06/22/22 1520 06/27/22 1511  BNP 414.0* 495.4* 333.8*    ProBNP (last 3 results) No results for input(s): "PROBNP" in the last 8760 hours.   D-Dimer No results for input(s): "DDIMER" in the last 72 hours. Hemoglobin A1C Recent Labs    06/27/22 1511  HGBA1C 9.3*   Fasting Lipid Panel Recent Labs    06/27/22 1825  CHOL 212*  HDL 55  LDLCALC 130*  TRIG 134  CHOLHDL 3.9   Thyroid Function Tests Recent Labs    06/27/22 1511  TSH 2.576     Other results:   Imaging    CARDIAC CATHETERIZATION  Result Date: 06/29/2022 Much improved hemodynamics on milrinone 0.25 mcg/kg/min after diuresis.     Medications:     Scheduled Medications:  apixaban  5 mg Oral BID   atorvastatin  20 mg Oral Daily   Chlorhexidine Gluconate Cloth  6 each Topical Daily   digoxin  0.125 mg Oral Daily   empagliflozin  25 mg Oral Daily   feeding supplement (GLUCERNA SHAKE)  237 mL Oral QAC lunch   insulin aspart  0-15 Units Subcutaneous TID WC   insulin aspart  0-5 Units Subcutaneous QHS   insulin glargine-yfgn  30 Units Subcutaneous Daily   levothyroxine  25 mcg Oral Q0600   magnesium oxide  400 mg Oral BID   multivitamin with minerals  1 tablet Oral Daily   mupirocin ointment  1 Application Nasal BID   sertraline  50 mg Oral Daily   sodium chloride flush  10-40 mL Intracatheter Q12H   sodium chloride flush  3 mL Intravenous Q12H   spironolactone  25 mg Oral Daily    Infusions:  amiodarone 30 mg/hr (06/30/22 0616)   milrinone 0.375 mcg/kg/min (06/30/22 0615)    PRN Medications: acetaminophen, acetaminophen, ondansetron (ZOFRAN) IV, sodium chloride flush   Assessment/Plan   1. Acute on chronic systolic CHF: Nonischemic cardiomyopathy, diagnosed 2020.  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  Cath in 12/20 with no significant coronary disease.  Medtronic ICD. Most recent echo in 2/23 showed EF 20-25% with normal RV.  Recently, patient has been symptomatically worse, NYHA class IV with profound orthopnea. RHC showed markedly elevated filling pressures, primarily pulmonary venous hypertension, low cardiac output, and low PAPI.  Patient was admitted for inotrope and diuresis. Echo this admission with EF <20%, mild RV dysfunction.   He was started on milrinone,  co-ox 68% this morning. Breathing much better, diuresed well, CVP 6/7.   - Continue milrinone 0.375, was increased yesterday post repeat cath, this has helped him significantly.   - IV Lasix  12 mg/hr stopped 11/8, CVP 6/7 but LVEDP/PCWP was elevated markedly out of proportion to RA pressure on cath.  - Starting home PO diuretic, 40 mg lasix BID - Continue digoxin 0.125 daily.  - Resume Entresto 24/26 bid for now, watch BP.  - Continue spironolactone 25 daily.  - Continue Jardiance.  - Hold beta blocker with low output/volume overload.  - Narrow QRS, not CRT candidate.  - Evaluation for LVAD placement, ongoing. Could happen next week as he is now inotrope dependant. He is significantly improved on milrinone gtt and may be able to go home on milrinone while we are deciding on LVAD and timing though d/t support. He may have trouble finding someone to stay with him post-op, will need to discuss.  2. Atrial fibrillation/flutter: S/p flutter ablation.  He mainly had atrial flutter.  He is in NSR today.  - Continue apixaban.  - Continue amiodarone gtt while on milrinone to try to keep him in rhythm.  3. H/o DVT: He is on apixaban.  4. NSVT - brief runs overnight - Keep K>4, Mg >2 - K 3.7, will repleat  Length of Stay: [place] AGACNP-BC  06/30/2022, 7:03 AM  Advanced Heart Failure Team Pager [phone] (M-F; 7a - 5p)  Please contact [facility] for night-coverage after hours (5p -7a ) and weekends on [email or web address]  Patient seen with NP, agree with the above note.   Doing well this morning.  Creatinine lower at 1.2.  Co-ox 68% on milrinone 0.375.  CVP 5-6 this morning.   General: NAD Neck: No JVD, no thyromegaly or thyroid nodule.  Lungs: Clear to auscultation bilaterally with normal respiratory effort. CV: Lateral PMI.  Heart regular S1/S2, no S3/S4, no murmur.  No peripheral edema.   Abdomen: Soft, nontender, no hepatosplenomegaly, no distention.  Skin: Intact without lesions or  rashes.  Neurologic: Alert and oriented x 3.  Psych: Normal affect. Extremities: No clubbing or cyanosis.  HEENT: Normal.   Doing well this morning. SBP 110s, no arrhythmias. RHC much improved on milrinone.  - Agree with starting back on home Lasix 40 mg po bid.  - Can start home Entresto 24/26 bid. Hold if BP gets too low.  - Tentative plan for LVAD Tuesday.  Continue pre-op testing and discussions with potential caregivers.  - Stop apixaban, start heparin gtt.   Loralie Champagne 06/30/2022 8:33 AM

## 2022-06-30 NOTE — Care Management Important Message (Signed)
Important Message  Patient Details  Name: Darryl Frye MRN: [id number] Date of Birth: 1961-08-01   Medicare Important Message Given:  Yes     Santhiago Collingsworth Montine Circle 06/30/2022, 4:04 PM

## 2022-06-30 NOTE — Progress Notes (Signed)
CARDIAC REHAB PHASE I   PRE:  Rate/Rhythm: 72 SR    BP: sitting 110/77    SaO2: 95 RA  MODE:  Ambulation: 410 ft   POST:  Rate/Rhythm: 84 SR    BP: sitting 120/78     SaO2: 97 RA  Pt stood independently and walked with rollator. No c/o, sts he feels great. Able to walk without O2 and denied SOB. Return to EOB. Gave IS, able to perform 2500 ml. Encouraged use and walking as able.Also discussed sternal precautions. 6 min walk test already performed by PT. [id number]   Yves Dill BS, ACSM-CEP 06/30/2022 3:25 PM

## 2022-06-30 NOTE — Progress Notes (Signed)
LVAD Initial Psychosocial Screening  Date/Time Initiated:  06-30-22 10:25 am Referral Source: Tanda Rockers, LVAD Coordinator  Referral Reason:  LVAD implantation Source of Information:  Pt, Darryl Frye (ex wife), Darryl Frye (friend) and chart review  Demographics Name:  Darryl Frye Address:  [address] [place] [address] Cell: [phone] Marital Status:  Divorced  Faith:  [employer] Primary Language:  English DOB:  1960-10-24  Medical & Follow-up Adherence to Medical regimen/INR checks: compliant  Medication adherence: compliant  Physician/Clinic Appointment Attendance: compliant Comments:    Advance Directives: Do you have a Living Will or Medical POA? Yes  Would you like to complete a Living Will and Medical POA prior to surgery?  Yes Patient completed today.  Do you have Goals of Care? Yes  Have you had a consult with the Palliative Care Team at [facility]? Yes  Psychological Health Appearance:  Unremarkable and In hospital gown Mental Status:  Alert, oriented Eye Contact:  Good Thought Content:  Coherent Speech:  Logical/coherent Mood:  Appropriate and Good Spirit  Affect:  Appropriate to circumstance Insight:  Good Judgement: Unimpaired Interaction Style:  Engaged  Family/Social Information Who lives in your home? Name:   Relationship:   Lives alone  Other family members/support persons in your life? Name:   Relationship:   Darryl Frye  Ex wife  [phone] Darryl Frye  Darryl Frye  Daughter [phone]  Caregiving Needs Who is the primary caregiver? Darryl Frye status:  good Do you drive?  yes Do you work?  yes Physical Limitations:  none Do you have other care giving responsibilities?  none Contact number: [phone]  Who is the secondary caregiver? Darryl Frye City status:  good Do you drive?  yes Do you work?  no Physical Limitations:  none Do you have other care giving responsibilities?   none Contact number: [phone]  Home Environment/Personal Care Do you have reliable phone service? Yes  If so, what is the number?  Spectrum Do you own or rent your home? Rent 3rd floor apartment Number of steps into the home? none How many levels in the home? 1 Assistive devices in the home? cane Electrical needs for LVAD (3 prong outlets)? yes Second hand smoke exposure in the home? no Travel distance from [facility]? 35 minutes  Community Are you active with community agencies/resources/homecare? No Agency Name:  n/a Are you active in a church, synagogue, mosque or other faith based community? Yes Faith based institutions name:  [facility] What other sources do you have for spiritual support? Yes family Are you active in any clubs or social organizations? none What do you do for fun?  Hobbies?  Interests? Watch TV and wash my car  Education/Work Information What is the last grade of school you completed? 12 th grade Preferred method of learning?  Hands on Do you have any problems with reading or writing?  No Are you currently employed?  Yes part time   When were you last employed? Last week  Name of employer? 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 Few months If you are not working, do you plan to return to work after VAD surgery? No Are you interested in job training or learning new skills?  No Did you serve in the [place]? No  If so, what branch? Other  Financial Information What is your source of income? SSD Do you have difficulty meeting your monthly expenses? No Can  you budget for the monthly cost for dressing supplies post procedure? Yes  Primary Health insurance:  [facility] Medicare Secondary Insurance: Medicaid What are your prescription co-pays? $0 Do you use mail order for your prescriptions?  No Have you ever had to refuse medication due to cost?  No Have you applied for Medicaid?   current Have you applied for Social Security Disability (SSI)  current  Medical Information Briefly describe why you are here for evaluation: He shared that he was hospitalized on 07-07-2019 and diagnosed with Hf.  Do you have a PCP or other medical provider? [name], FNP Are you able to complete your ADL's? yes Do you have a history of trauma, physical, emotional, or sexual abuse? no Do you have any family history of heart problems?Mother and Aunt Do you smoke now or past usage? past usage    Quit date: 07-07-2019 Do you drink alcohol now or past usage? past usage  admitted that he was a heavy drinker  Quit date:  07-07-2019 Are you currently using illegal drugs or misuse of medication or past usage? 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 Yes      If yes, where and when did you receive treatment? He states that the treatment was a [place] time ago and "it didn't help any" but "my HF diagnosis years later hit me that I was going to die if I didn't stop so I did."  Mental Health History How have you been feeling in the past year? SOB Have you ever had any problems with depression, anxiety or other mental health issues? none Do you see a counselor, psychiatrist or therapist?  No If you are currently experiencing problems are you interested in talking with a professional? No Have you or are you taking medications for anxiety/depression or any mental health concerns?  No  What are your coping strategies under stressful situations? Deal with it/ really don't have stress Are there any other stressors in your life? no Have you had any past or current thoughts of suicide? no How many hours do you sleep at night? 8 How is your appetite? ok Would you be interested in attending the LVAD support group? yes  PHQ2 Depression Scale: 0  Legal Do you currently have any legal issues/problems?  Denies  Have you had any  legal issues/problems in the past?  no  Plan for VAD Implementation Do you know and understand what happens during the VAD surgery? Patient Verbalizes Understanding  of surgery and able to describe details What do you know about the risks and side effect associated with VAD surgery? Patient Verbalizes Understanding  of risks (infection, stroke and death) Explain what will happen right after surgery: Patient Verbalizes Understanding  of OR to ICU and will be intubated What is your plan for transportation for the first 8 weeks post-surgery? (Patients are not recommended to drive post-surgery for 8 weeks)  Driver:    Darryl Frye and Darryl Miller Do you have airbags in your vehicle?  There is a risk of discharging the device if the airbag were to deploy. What do you know about your diet post-surgery? Patient Verbalizes Understanding  of Heart healthy How do you plan to monitor your medications, current and future?   Pill bottles but considering pill box How do you plan to complete ADL's post-surgery?  Ask for help when needed Will it be difficult to ask for help from your caregivers?  No  Please explain what you hope will be improved about your life as a result of receiving the LVAD? Breathing better and able to do more things Please tell me your biggest concern or fear about living with the LVAD?  No fears Please explain your understanding of how their body will change?  Verbalizes Are you worried about these changes? no Do you see any barriers to your surgery or follow-up? no  Understanding of LVAD Patient states understanding of the following: Surgical procedures and risks, Electrical need for LVAD (3 prong outlets), Safety precautions with LVAD (water, etc.), LVAD daily self-care (dressing changes, computer check, extra supplies), Outpatient follow up (LVAD clinic appts, monitoring blood thinners), and Need for Emergency Planning  Discussed and Reviewed with Patient and Caregiver  Patient's current  level of motivation to prepare for LVAD: Motivated Patient's present Level of Consent for LVAD: Ready    Education provided to patient/family/caregiver:   Caregiver role and responsibiltiy, Financial planning for LVAD, Role of Clinical Social Worker, and Signs of Depression and Anxiety   Caregiver questions Please explain what you hope will be improved about your life and loved one's life as a result of receiving the LVAD?  Live longer What is your biggest concern or fear about caregiving with an LVAD patient?  "This nursing is not my thing but if I have to help someone I always will do what is needed" What is your plan for availability to provide care 24/7 x2 weeks post op and dressing changes ongoing?  Rotate schedule with Darryl Frye and Darryl Frye Who is the relief/backup caregiver and what is their availability?  Darryl Frye Preferred method of learning? Hands on  Do you drive? yes How do you handle stressful situations?  Breathe Do you think you can do this? yes Is there anything that concerns about caregiving?  none Do you provide caregiving to anyone else?  no   Caregiver's current level of motivation to prepare for LVAD: Motivated Caregiver's present level of consent for LVAD: Ready  Clinical Interventions Needed:    CSW will monitor signs and symptoms of depression and assist with adjustment to life with an LVAD.  CSW will monitor tobacco/alcohol and substance use and provide resources if needed. 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  CSW encouraged attendance with the LVAD Support Group to assist further with adjustment and post implant peer support.  Clinical Impressions/Recommendations:   Mr. Brucato is a 61 yo male who is divorced although has a good relationship with his ex wife Darryl Frye who will be his primary caregiver. He reports he has a daughter and grandchild who reside in [place] and are very supportive but due to distance and employment are limited with caregiver support. He worked  a part time job as a custodian until recent hospitalization and has a disability income as well. He has Medicare and Medicaid and denies any concerns with medical bills or ability to obtain mediations. He completed his Living Will during this hospitalization. His caregivers Darryl Frye and Darryl Frye were both present for full assessment and deny any concerns with the caregiver role. He attends the [facility] [facility] [employer] and enjoys watching TV and washing his car for pleasure. He reports a history of tobacco, alcohol and substance use but when diagnosed with HF in 2020 he was scared of dying and stopped cold [place]. He stated that he set his mind to clean living and has been clean for 3 years now. He denies any concerns with mental health and scored a 0 on  the PHQ2. He hopes to be breathing better and able to do more things as his goal for receiving the LVAD.  Patient appears to be a good candidate for LVAD implantation. CSW will continue to follow for supportive needs and adjustment to life with an LVAD. Raquel Sarna, LCSW, [place]   Darryl Liv, LCSW

## 2022-06-30 NOTE — Progress Notes (Signed)
This chaplain is present for F/U spiritual care in the space of notarizing the Pt. Advance Directive: HCPOA and Living Will. The chaplain is present with the Pt., notary, and witnesses. Family is not present.  The Pt. named Mamie Nick and Dona Klemann as healthcare agents. The healthcare agents will share medical decision making equally. The Pt. Living Will is notarized.    The chaplain gave the Pt. the original AD along with two copies.  The chaplain scanned a copy of the AD into the Pt. EMR.  This chaplain is available for F/U spiritual care as needed.  Chaplain Sallyanne Kuster [phone]

## 2022-07-01 DIAGNOSIS — R570 Cardiogenic shock: Secondary | ICD-10-CM | POA: Diagnosis not present

## 2022-07-01 LAB — CBC
HCT: 46.1 % (ref 39.0–52.0)
Hemoglobin: 14.7 g/dL (ref 13.0–17.0)
MCH: 27.7 pg (ref 26.0–34.0)
MCHC: 31.9 g/dL (ref 30.0–36.0)
MCV: 87 fL (ref 80.0–100.0)
Platelets: 305 10*3/uL (ref 150–400)
RBC: 5.3 MIL/uL (ref 4.22–5.81)
RDW: 14.7 % (ref 11.5–15.5)
WBC: 6.3 10*3/uL (ref 4.0–10.5)
nRBC: 0 % (ref 0.0–0.2)

## 2022-07-01 LAB — HEPARIN LEVEL (UNFRACTIONATED)
Heparin Unfractionated: 0.39 IU/mL (ref 0.30–0.70)
Heparin Unfractionated: 0.39 IU/mL (ref 0.30–0.70)

## 2022-07-01 LAB — GLUCOSE, CAPILLARY
Glucose-Capillary: 104 mg/dL — ABNORMAL HIGH (ref 70–99)
Glucose-Capillary: 152 mg/dL — ABNORMAL HIGH (ref 70–99)
Glucose-Capillary: 195 mg/dL — ABNORMAL HIGH (ref 70–99)
Glucose-Capillary: 213 mg/dL — ABNORMAL HIGH (ref 70–99)

## 2022-07-01 LAB — APTT
aPTT: 68 seconds — ABNORMAL HIGH (ref 24–36)
aPTT: 68 seconds — ABNORMAL HIGH (ref 24–36)

## 2022-07-01 LAB — BASIC METABOLIC PANEL
Anion gap: 8 (ref 5–15)
BUN: 20 mg/dL (ref 6–20)
CO2: 24 mmol/L (ref 22–32)
Calcium: 8.3 mg/dL — ABNORMAL LOW (ref 8.9–10.3)
Chloride: 104 mmol/L (ref 98–111)
Creatinine, Ser: 1.14 mg/dL (ref 0.61–1.24)
GFR, Estimated: >60 mL/min (ref >60)
Glucose, Bld: 99 mg/dL (ref 70–99)
Potassium: 4.1 mmol/L (ref 3.5–5.1)
Sodium: 136 mmol/L (ref 135–145)

## 2022-07-01 LAB — COOXEMETRY PANEL
Carboxyhemoglobin: 1.9 % — ABNORMAL HIGH (ref 0.5–1.5)
Carboxyhemoglobin: 0.8 % (ref 0.5–1.5)
Methemoglobin: <0.7 % (ref 0.0–1.5)
Methemoglobin: 1 % (ref 0.0–1.5)
O2 Saturation: 80.9 %
O2 Saturation: 59.3 %
Total hemoglobin: 15.1 g/dL (ref 12.0–16.0)
Total hemoglobin: 15.5 g/dL (ref 12.0–16.0)

## 2022-07-01 NOTE — Plan of Care (Signed)
Problem: Education: Goal: Understanding of CV disease, CV risk reduction, and recovery process will improve 07/01/2022 0151 by Shanon Ace, RN Outcome: Progressing 07/01/2022 0151 by Shanon Ace, RN Outcome: Progressing Goal: Individualized Educational Video(s) 07/01/2022 0151 by Shanon Ace, RN Outcome: Progressing 07/01/2022 0151 by Shanon Ace, RN Outcome: Progressing   Problem: Activity: Goal: Ability to return to baseline activity level will improve 07/01/2022 0151 by Shanon Ace, RN Outcome: Progressing 07/01/2022 0151 by Shanon Ace, RN Outcome: Progressing   Problem: Cardiovascular: Goal: Ability to achieve and maintain adequate cardiovascular perfusion will improve 07/01/2022 0151 by Shanon Ace, RN Outcome: Progressing 07/01/2022 0151 by Shanon Ace, RN Outcome: Progressing Goal: Vascular access site(s) Level 0-1 will be maintained 07/01/2022 0151 by Shanon Ace, RN Outcome: Progressing 07/01/2022 0151 by Shanon Ace, RN Outcome: Progressing   Problem: Health Behavior/Discharge Planning: Goal: Ability to safely manage health-related needs after discharge will improve 07/01/2022 0151 by Shanon Ace, RN Outcome: Progressing 07/01/2022 0151 by Shanon Ace, RN Outcome: Progressing   Problem: Education: Goal: Ability to describe self-care measures that may prevent or decrease complications (Diabetes Survival Skills Education) will improve 07/01/2022 0151 by Shanon Ace, RN Outcome: Progressing 07/01/2022 0151 by Shanon Ace, RN Outcome: Progressing Goal: Individualized Educational Video(s) 07/01/2022 0151 by Shanon Ace, RN Outcome: Progressing 07/01/2022 0151 by Shanon Ace, RN Outcome: Progressing   Problem: Coping: Goal: Ability to adjust to condition or change in health will improve 07/01/2022 0151 by Shanon Ace, RN Outcome: Progressing 07/01/2022 0151 by Shanon Ace, RN Outcome: Progressing   Problem: Fluid  Volume: Goal: Ability to maintain a balanced intake and output will improve 07/01/2022 0151 by Shanon Ace, RN Outcome: Progressing 07/01/2022 0151 by Shanon Ace, RN Outcome: Progressing   Problem: Health Behavior/Discharge Planning: Goal: Ability to identify and utilize available resources and services will improve 07/01/2022 0151 by Shanon Ace, RN Outcome: Progressing 07/01/2022 0151 by Shanon Ace, RN Outcome: Progressing Goal: Ability to manage health-related needs will improve 07/01/2022 0151 by Shanon Ace, RN Outcome: Progressing 07/01/2022 0151 by Shanon Ace, RN Outcome: Progressing   Problem: Metabolic: Goal: Ability to maintain appropriate glucose levels will improve 07/01/2022 0151 by Shanon Ace, RN Outcome: Progressing 07/01/2022 0151 by Shanon Ace, RN Outcome: Progressing   Problem: Nutritional: Goal: Maintenance of adequate nutrition will improve 07/01/2022 0151 by Shanon Ace, RN Outcome: Progressing 07/01/2022 0151 by Shanon Ace, RN Outcome: Progressing Goal: Progress toward achieving an optimal weight will improve 07/01/2022 0151 by Shanon Ace, RN Outcome: Progressing 07/01/2022 0151 by Shanon Ace, RN Outcome: Progressing   Problem: Skin Integrity: Goal: Risk for impaired skin integrity will decrease 07/01/2022 0151 by Shanon Ace, RN Outcome: Progressing 07/01/2022 0151 by Shanon Ace, RN Outcome: Progressing   Problem: Tissue Perfusion: Goal: Adequacy of tissue perfusion will improve 07/01/2022 0151 by Shanon Ace, RN Outcome: Progressing 07/01/2022 0151 by Shanon Ace, RN Outcome: Progressing   Problem: Education: Goal: Knowledge of General Education information will improve Description: Including pain rating scale, medication(s)/side effects and non-pharmacologic comfort measures 07/01/2022 0151 by Shanon Ace, RN Outcome: Progressing 07/01/2022 0151 by Shanon Ace, RN Outcome: Progressing    Problem: Health Behavior/Discharge Planning: Goal: Ability to manage health-related needs will improve 07/01/2022 0151 by Shanon Ace, RN Outcome: Progressing 07/01/2022 0151 by Shanon Ace, RN Outcome: Progressing   Problem: Clinical Measurements: Goal: Ability to maintain clinical measurements within normal limits will improve 07/01/2022 0151 by Shanon Ace, RN Outcome: Progressing 07/01/2022 0151 by Shanon Ace, RN Outcome: Progressing Goal: Will remain free from infection 07/01/2022 0151 by  Shanon Ace, RN Outcome: Progressing 07/01/2022 0151 by Shanon Ace, RN Outcome: Progressing Goal: Diagnostic test results will improve 07/01/2022 0151 by Shanon Ace, RN Outcome: Progressing 07/01/2022 0151 by Shanon Ace, RN Outcome: Progressing Goal: Respiratory complications will improve 07/01/2022 0151 by Shanon Ace, RN Outcome: Progressing 07/01/2022 0151 by Shanon Ace, RN Outcome: Progressing Goal: Cardiovascular complication will be avoided 07/01/2022 0151 by Shanon Ace, RN Outcome: Progressing 07/01/2022 0151 by Shanon Ace, RN Outcome: Progressing   Problem: Activity: Goal: Risk for activity intolerance will decrease 07/01/2022 0151 by Shanon Ace, RN Outcome: Progressing 07/01/2022 0151 by Shanon Ace, RN Outcome: Progressing   Problem: Nutrition: Goal: Adequate nutrition will be maintained 07/01/2022 0151 by Shanon Ace, RN Outcome: Progressing 07/01/2022 0151 by Shanon Ace, RN Outcome: Progressing   Problem: Coping: Goal: Level of anxiety will decrease 07/01/2022 0151 by Shanon Ace, RN Outcome: Progressing 07/01/2022 0151 by Shanon Ace, RN Outcome: Progressing   Problem: Elimination: Goal: Will not experience complications related to bowel motility 07/01/2022 0151 by Shanon Ace, RN Outcome: Progressing 07/01/2022 0151 by Shanon Ace, RN Outcome: Progressing Goal: Will not experience complications related  to urinary retention 07/01/2022 0151 by Shanon Ace, RN Outcome: Progressing 07/01/2022 0151 by Shanon Ace, RN Outcome: Progressing   Problem: Pain Managment: Goal: General experience of comfort will improve 07/01/2022 0151 by Shanon Ace, RN Outcome: Progressing 07/01/2022 0151 by Shanon Ace, RN Outcome: Progressing   Problem: Safety: Goal: Ability to remain free from injury will improve 07/01/2022 0151 by Shanon Ace, RN Outcome: Progressing 07/01/2022 0151 by Shanon Ace, RN Outcome: Progressing   Problem: Skin Integrity: Goal: Risk for impaired skin integrity will decrease 07/01/2022 0151 by Shanon Ace, RN Outcome: Progressing 07/01/2022 0151 by Shanon Ace, RN Outcome: Progressing

## 2022-07-01 NOTE — Progress Notes (Signed)
ANTICOAGULATION CONSULT NOTE  Pharmacy Consult for Heparin Indication: atrial fibrillation  No Known Allergies  Patient Measurements: Height: 5\' 11"  (180.3 cm) Weight: 91.9 kg (202 lb 9.6 oz) IBW/kg (Calculated) : 75.3 HEPARIN DW (KG): 91.1    Vital Signs: Temp: 98.6 F (37 C) (11/11 0749) Temp Source: Oral (11/11 0749) BP: 104/71 (11/11 0749)  Labs: Recent Labs    06/29/22 0410 06/29/22 1735 06/29/22 6701 06/29/22 0815 06/30/22 0510 06/30/22 2035 07/01/22 0542  HGB  --  17.7*   < > 17.7*  --   --  14.7  HCT  --  52.0  --  52.0  --   --  46.1  PLT  --   --   --   --   --   --  305  APTT  --   --   --   --   --  49* 68*  HEPARINUNFRC  --   --   --   --   --   --  0.39  CREATININE 1.50*  --   --   --  1.20  --  1.14   < > = values in this interval not displayed.     Estimated Creatinine Clearance: 80.8 mL/min (by C-G formula based on SCr of 1.14 mg/dL).   Medical History: Past Medical History:  Diagnosis Date   Acute kidney injury ([place])    Acute respiratory failure (H[facility])    Atrial flutter (HCC)    on Eliquis   CHF (congestive heart failure) ([place])    EF 20-25% 10/15/21, AICD in place.   Coronary artery disease    Diabetes mellitus without complication (H[facility])    DVT (deep venous thrombosis) ([place]) 06/2019   Heart murmur    Hypertension    Presence of permanent cardiac pacemaker    Renal disorder       Assessment: 61yom admitted with heart failure and low cardiac output now stable on inotropes.   Planning LVAD implant next week. Patient also with atrial fibrillation on apixaban for anticoagulation prior to admit with last dose take 11/6.  Bridging with heparin pre-op.    Heparin level and aptt at goal and correlating. Apixaban appears to have cleared, will use heparin levels alone.   Goal of Therapy:  Heparin level 0.3-0.7 units/ml Monitor platelets by anticoagulation protocol: Yes   Plan:  Continue heparin at 1500 units/hr Check confirmatory  heparin level at noon  Erin Hearing PharmD., BCPS Clinical Pharmacist 07/01/2022 7:56 AM

## 2022-07-01 NOTE — Progress Notes (Signed)
ANTICOAGULATION CONSULT NOTE  Pharmacy Consult for Heparin Indication: atrial fibrillation  No Known Allergies  Patient Measurements: Height: 5\' 11"  (180.3 cm) Weight: 91.9 kg (202 lb 9.6 oz) IBW/kg (Calculated) : 75.3 HEPARIN DW (KG): 91.1    Vital Signs: Temp: 98.7 F (37.1 C) (11/11 1632) Temp Source: Oral (11/11 1632) BP: 114/85 (11/11 1632)  Labs: Recent Labs    06/29/22 0410 06/29/22 6599 06/29/22 3570 06/29/22 0815 06/30/22 0510 06/30/22 2035 07/01/22 0542 07/01/22 1200 07/01/22 1502  HGB  --  17.7*   < > 17.7*  --   --  14.7  --   --   HCT  --  52.0  --  52.0  --   --  46.1  --   --   PLT  --   --   --   --   --   --  305  --   --   APTT  --   --   --   --   --  49* 68*  --  68*  HEPARINUNFRC  --   --   --   --   --   --  0.39 QUESTIONABLE RESULTS, RECOMMEND RECOLLECT TO VERIFY 0.39  CREATININE 1.50*  --   --   --  1.20  --  1.14  --   --    < > = values in this interval not displayed.    Estimated Creatinine Clearance: 80.8 mL/min (by C-G formula based on SCr of 1.14 mg/dL).   Medical History: Past Medical History:  Diagnosis Date   Acute kidney injury ([place])    Acute respiratory failure (H[facility])    Atrial flutter (HCC)    on Eliquis   CHF (congestive heart failure) ([place])    EF 20-25% 10/15/21, AICD in place.   Coronary artery disease    Diabetes mellitus without complication (H[facility])    DVT (deep venous thrombosis) ([place]) 06/2019   Heart murmur    Hypertension    Presence of permanent cardiac pacemaker    Renal disorder       Assessment: 61yom admitted with heart failure and low cardiac output now stable on inotropes. Planning LVAD implant next week. Patient also with atrial fibrillation on apixaban for anticoagulation prior to admit with last dose take 11/6.  Bridging with heparin pre-op.    Heparin level and aptt at goal and correlating x2 levels. Apixaban appears to have cleared, will use heparin levels for monitoring.   Goal of Therapy:   Heparin level 0.3-0.7 units/ml Monitor platelets by anticoagulation protocol: Yes   Plan:  Continue heparin at 1500 units/hr Monitor daily heparin level, CBC Monitor for signs/symptoms of bleeding    Benetta Spar, PharmD, BCPS, BCCP Clinical Pharmacist  Please check AMION for all [place] phone numbers After 10:00 PM, call [place] [phone]

## 2022-07-01 NOTE — Progress Notes (Signed)
Patient ID: Darryl Frye, male   DOB: Nov 13, 1960, 61 y.o.   MRN: [id number]     Advanced Heart Failure Rounding Note  PCP-Cardiologist: Carlyle Dolly, MD   Subjective:    On milrinone 0.337mcg/kg/min, no reported SOB today. Mixed venous 60 this AM.   Repeat RHC 11/9: Procedural Findings (milrinone 0.25): Hemodynamics (mmHg) RA mean 1 RV 18/4 PA 29/16, mean 21 PCWP mean 8 Oxygen saturations: PA 66% AO 95% Cardiac Output (Fick) 4.58  Cardiac Index (Fick) 2.19 PVR 2.8 WU PAPI 13   RHC Procedural Findings 11/7: Hemodynamics (mmHg) RA mean 14 RV 49/24 PA 66/53, mean 54 PCWP mean 43 Oxygen saturations: PA 54% AO 93% Cardiac Output (Fick) 3.4  Cardiac Index (Fick) 1.62 PVR 3.2 WU Cardiac Output (Thermo) 2.75 Cardiac Index (Thermo) 1.31  PAPI 0.93   Objective:   Weight Range: 91.9 kg Body mass index is 28.26 kg/m.   Vital Signs:   Temp:  [98 F (36.7 C)-98.7 F (37.1 C)] 98.7 F (37.1 C) (11/11 1211) Resp:  [17-25] 19 (11/11 1211) BP: (104-114)/(71-84) 106/73 (11/11 1211) SpO2:  [93 %-98 %] 97 % (11/11 1211) Weight:  [91.9 kg] 91.9 kg (11/11 0345) Last BM Date : 06/30/22  Weight change: Filed Weights   06/29/22 0401 06/30/22 0609 07/01/22 0345  Weight: 89.3 kg 91.2 kg 91.9 kg    Intake/Output:   Intake/Output Summary (Last 24 hours) at 07/01/2022 1437 Last data filed at 07/01/2022 1100 Gross per 24 hour  Intake 2171.82 ml  Output 3850 ml  Net -1678.18 ml     Physical Exam   CVP 7 General:  well appearing.  No respiratory difficulty HEENT: normal Neck: supple. JVD <8 Cor: PMI nondisplaced. Regular rate & rhythm. No rubs, gallops or murmurs. Lungs: clear Abdomen: soft, nontender, nondistended. No hepatosplenomegaly. No bruits or masses. Good bowel sounds. Extremities: no cyanosis, clubbing, rash, edema. PICC RUE Neuro: alert & oriented x 3, cranial nerves grossly intact. moves all 4 extremities w/o difficulty. Affect pleasant.   Telemetry    NSR 70s, brief runs NSVT, 0-1 PVCs/min (personally reviewed)  Labs    CBC Recent Labs    06/29/22 0815 07/01/22 0542  WBC  --  6.3  HGB 17.7* 14.7  HCT 52.0 46.1  MCV  --  87.0  PLT  --  893    Basic Metabolic Panel Recent Labs    06/29/22 0410 06/29/22 0812 06/30/22 0510 07/01/22 0542  NA 131*   < > 134* 136  K 3.7   < > 3.7 4.1  CL 94*  --  101 104  CO2 25  --  25 24  GLUCOSE 157*  --  123* 99  BUN 29*  --  23* 20  CREATININE 1.50*  --  1.20 1.14  CALCIUM 8.5*  --  8.4* 8.3*  MG 2.5*  --   --   --    < > = values in this interval not displayed.    Liver Function Tests No results for input(s): "AST", "ALT", "ALKPHOS", "BILITOT", "PROT", "ALBUMIN" in the last 72 hours.  No results for input(s): "LIPASE", "AMYLASE" in the last 72 hours. Cardiac Enzymes No results for input(s): "CKTOTAL", "CKMB", "CKMBINDEX", "TROPONINI" in the last 72 hours.  BNP: BNP (last 3 results) Recent Labs    06/11/22 1706 06/22/22 1520 06/27/22 1511  BNP 414.0* 495.4* 333.8*     ProBNP (last 3 results) No results for input(s): "PROBNP" in the last 8760 hours.   D-Dimer No results  for input(s): "DDIMER" in the last 72 hours. Hemoglobin A1C No results for input(s): "HGBA1C" in the last 72 hours.  Fasting Lipid Panel No results for input(s): "CHOL", "HDL", "LDLCALC", "TRIG", "CHOLHDL", "LDLDIRECT" in the last 72 hours.  Thyroid Function Tests No results for input(s): "TSH", "T4TOTAL", "T3FREE", "THYROIDAB" in the last 72 hours.  Invalid input(s): "FREET3"   Other results:   Imaging    No results found.   Medications:     Scheduled Medications:  atorvastatin  20 mg Oral Daily   Chlorhexidine Gluconate Cloth  6 each Topical Daily   digoxin  0.125 mg Oral Daily   empagliflozin  25 mg Oral Daily   feeding supplement (GLUCERNA SHAKE)  237 mL Oral QAC lunch   furosemide  40 mg Oral BID   insulin aspart  0-15 Units Subcutaneous TID WC   insulin aspart  0-5  Units Subcutaneous QHS   insulin glargine-yfgn  30 Units Subcutaneous Daily   levothyroxine  25 mcg Oral Q0600   magnesium oxide  400 mg Oral BID   multivitamin with minerals  1 tablet Oral Daily   mupirocin ointment  1 Application Nasal BID   sacubitril-valsartan  1 tablet Oral BID   sertraline  50 mg Oral Daily   sodium chloride flush  10-40 mL Intracatheter Q12H   sodium chloride flush  3 mL Intravenous Q12H   spironolactone  25 mg Oral Daily    Infusions:  amiodarone 30 mg/hr (07/01/22 0643)   heparin 1,500 Units/hr (07/01/22 0903)   milrinone 0.375 mcg/kg/min (07/01/22 0240)    PRN Medications: acetaminophen, ondansetron (ZOFRAN) IV, sodium chloride flush   Assessment/Plan   1. Acute on chronic systolic CHF: Nonischemic cardiomyopathy, diagnosed 2020.  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  Cath in 12/20 with no significant coronary disease.  Medtronic ICD. Most recent echo in 2/23 showed EF 20-25% with normal RV.  Recently, patient has been symptomatically worse, NYHA class IV with profound orthopnea. RHC showed markedly elevated filling pressures, primarily pulmonary venous hypertension, low cardiac output, and low PAPI.  Patient was admitted for inotrope and diuresis. Echo this admission with EF <20%, mild RV dysfunction.   He was started on milrinone, co-ox 68% this morning. Breathing much better, diuresed well, CVP 6/7.   - Continue milrinone 0.375, reports continued improvement in dyspnea.  - Lasix 40mg  BID with -1.8L net negative yesterday; 1L urine output thus far today. - Continue digoxin 0.125 daily.  - Resume Entresto 24/26 bid for now, watch BP. May need to discontinue tomorrow to avoid post-operative vasoplegia. - Continue spironolactone 25 daily.  - Continue Jardiance.  - Hold beta  blocker with low output/volume overload.  - Narrow QRS, not CRT candidate.  - Evaluation for LVAD placement, ongoing. Could happen next week as he is now inotrope dependant. He is significantly improved on milrinone gtt and may be able to go home on milrinone while we are deciding on LVAD and timing though d/t support. He may have trouble finding someone to stay with him post-op, will need to discuss.  2. Atrial fibrillation/flutter: S/p flutter ablation.  He mainly had atrial flutter.  He is in NSR today.  - Continue apixaban.  - Continue amiodarone gtt while on milrinone to try to keep him in rhythm.  3. H/o DVT: He is  on apixaban.  4. NSVT - brief runs overnight - Keep K>4, Mg >2  Length of Stay: [place] DO 07/01/2022, 2:37 PM

## 2022-07-01 NOTE — Progress Notes (Signed)
Mobility Specialist Progress Note:   07/01/22 1628  Mobility  Activity Ambulated with assistance in hallway  Level of Assistance Standby assist, set-up cues, supervision of patient - no hands on  Assistive Device Front wheel walker  Distance Ambulated (ft) 400 ft  Activity Response Tolerated well  Mobility Referral Yes  $Mobility charge 1 Mobility   Pt received in bed and agreeable. No complaints. Pt left in bed with all needs met and call bell in reach.   Andrey Campanile Mobility Specialist Please contact via SecureChat or  Rehab office at [phone]

## 2022-07-01 NOTE — Progress Notes (Signed)
Mobility Specialist Progress Note:   07/01/22 1057  Mobility  Activity Ambulated with assistance in hallway  Level of Assistance Standby assist, set-up cues, supervision of patient - no hands on  Assistive Device Four wheel walker  Distance Ambulated (ft) 400 ft  Activity Response Tolerated well  Mobility Referral Yes  $Mobility charge 1 Mobility   Pt received in bed and agreeable. No complaints. Pt left in bed with all needs met and call bell in reach. Pt requesting second walk for today, will f/u as schedule allows.   Brenlyn Beshara Mobility Specialist-Acute Rehab Secure Chat only

## 2022-07-02 DIAGNOSIS — I5023 Acute on chronic systolic (congestive) heart failure: Secondary | ICD-10-CM | POA: Diagnosis not present

## 2022-07-02 LAB — HEPARIN LEVEL (UNFRACTIONATED): Heparin Unfractionated: 0.25 IU/mL — ABNORMAL LOW (ref 0.30–0.70)

## 2022-07-02 LAB — CBC
HCT: 44.8 % (ref 39.0–52.0)
Hemoglobin: 15.1 g/dL (ref 13.0–17.0)
MCH: 28.8 pg (ref 26.0–34.0)
MCHC: 33.7 g/dL (ref 30.0–36.0)
MCV: 85.3 fL (ref 80.0–100.0)
Platelets: 291 10*3/uL (ref 150–400)
RBC: 5.25 MIL/uL (ref 4.22–5.81)
RDW: 14.9 % (ref 11.5–15.5)
WBC: 6.3 10*3/uL (ref 4.0–10.5)
nRBC: 0 % (ref 0.0–0.2)

## 2022-07-02 LAB — BASIC METABOLIC PANEL
Anion gap: 10 (ref 5–15)
BUN: 17 mg/dL (ref 6–20)
CO2: 24 mmol/L (ref 22–32)
Calcium: 8.8 mg/dL — ABNORMAL LOW (ref 8.9–10.3)
Chloride: 102 mmol/L (ref 98–111)
Creatinine, Ser: 1.11 mg/dL (ref 0.61–1.24)
GFR, Estimated: >60 mL/min (ref >60)
Glucose, Bld: 105 mg/dL — ABNORMAL HIGH (ref 70–99)
Potassium: 4.1 mmol/L (ref 3.5–5.1)
Sodium: 136 mmol/L (ref 135–145)

## 2022-07-02 LAB — GLUCOSE, CAPILLARY
Glucose-Capillary: 110 mg/dL — ABNORMAL HIGH (ref 70–99)
Glucose-Capillary: 140 mg/dL — ABNORMAL HIGH (ref 70–99)
Glucose-Capillary: 177 mg/dL — ABNORMAL HIGH (ref 70–99)
Glucose-Capillary: 301 mg/dL — ABNORMAL HIGH (ref 70–99)

## 2022-07-02 LAB — DIGOXIN LEVEL: Digoxin Level: 0.3 ng/mL — ABNORMAL LOW (ref 0.8–2.0)

## 2022-07-02 LAB — COOXEMETRY PANEL
Carboxyhemoglobin: 1.6 % — ABNORMAL HIGH (ref 0.5–1.5)
Methemoglobin: <0.7 % (ref 0.0–1.5)
O2 Saturation: 71 %
Total hemoglobin: 15.5 g/dL (ref 12.0–16.0)

## 2022-07-02 NOTE — Plan of Care (Signed)

## 2022-07-02 NOTE — Progress Notes (Signed)
Patient ID: Darryl Frye, male   DOB: Nov 22, 1960, 61 y.o.   MRN: [id number]     Advanced Heart Failure Rounding Note  PCP-Cardiologist: Carlyle Dolly, MD   Subjective:    On milrinone 0.331mcg/kg/min. Co-ox 71%   Weight down another 3 pounds. Now on po lasix  CVP ~5  Scr stable. Denies CP or SOB.   Repeat RHC 11/9: Procedural Findings (milrinone 0.25): Hemodynamics (mmHg) RA mean 1 RV 18/4 PA 29/16, mean 21 PCWP mean 8 Oxygen saturations: PA 66% AO 95% Cardiac Output (Fick) 4.58  Cardiac Index (Fick) 2.19 PVR 2.8 WU PAPI 13   RHC Procedural Findings 11/7: Hemodynamics (mmHg) RA mean 14 RV 49/24 PA 66/53, mean 54 PCWP mean 43 Oxygen saturations: PA 54% AO 93% Cardiac Output (Fick) 3.4  Cardiac Index (Fick) 1.62 PVR 3.2 WU Cardiac Output (Thermo) 2.75 Cardiac Index (Thermo) 1.31  PAPI 0.93   Objective:   Weight Range: 90.7 kg Body mass index is 27.89 kg/m.   Vital Signs:   Temp:  [97.7 F (36.5 C)-98.7 F (37.1 C)] 98.1 F (36.7 C) (11/12 0914) Pulse Rate:  [68] 68 (11/12 0006) Resp:  [19-20] 20 (11/12 0914) BP: (104-114)/(70-85) 108/80 (11/12 0914) SpO2:  [94 %-98 %] 98 % (11/12 0914) Weight:  [90.7 kg] 90.7 kg (11/12 0340) Last BM Date : 07/01/22  Weight change: Filed Weights   06/30/22 0609 07/01/22 0345 07/02/22 0340  Weight: 91.2 kg 91.9 kg 90.7 kg    Intake/Output:   Intake/Output Summary (Last 24 hours) at 07/02/2022 1051 Last data filed at 07/02/2022 0630 Gross per 24 hour  Intake 2423.15 ml  Output 4030 ml  Net -1606.85 ml     Physical Exam   General:  Sitting up in bed. Well appearing. No resp difficulty HEENT: normal Neck: supple. no JVD. Carotids 2+ bilat; no bruits. + trach scar Cor: PMI nondisplaced. Regular rate & rhythm. No rubs, gallops or murmurs. Lungs: clear Abdomen: soft, nontender, nondistended. No hepatosplenomegaly. No bruits or masses. Good bowel sounds. Extremities: no cyanosis, clubbing, rash,  edema Neuro: alert & orientedx3, cranial nerves grossly intact. moves all 4 extremities w/o difficulty. Affect pleasant   Telemetry   NSR 60-70s Personally reviewed  Labs    CBC Recent Labs    07/01/22 0542 07/02/22 0505  WBC 6.3 6.3  HGB 14.7 15.1  HCT 46.1 44.8  MCV 87.0 85.3  PLT 305 532    Basic Metabolic Panel Recent Labs    07/01/22 0542 07/02/22 0505  NA 136 136  K 4.1 4.1  CL 104 102  CO2 24 24  GLUCOSE 99 105*  BUN 20 17  CREATININE 1.14 1.11  CALCIUM 8.3* 8.8*    Liver Function Tests No results for input(s): "AST", "ALT", "ALKPHOS", "BILITOT", "PROT", "ALBUMIN" in the last 72 hours.  No results for input(s): "LIPASE", "AMYLASE" in the last 72 hours. Cardiac Enzymes No results for input(s): "CKTOTAL", "CKMB", "CKMBINDEX", "TROPONINI" in the last 72 hours.  BNP: BNP (last 3 results) Recent Labs    06/11/22 1706 06/22/22 1520 06/27/22 1511  BNP 414.0* 495.4* 333.8*     ProBNP (last 3 results) No results for input(s): "PROBNP" in the last 8760 hours.   D-Dimer No results for input(s): "DDIMER" in the last 72 hours. Hemoglobin A1C No results for input(s): "HGBA1C" in the last 72 hours.  Fasting Lipid Panel No results for input(s): "CHOL", "HDL", "LDLCALC", "TRIG", "CHOLHDL", "LDLDIRECT" in the last 72 hours.  Thyroid Function Tests No results for  input(s): "TSH", "T4TOTAL", "T3FREE", "THYROIDAB" in the last 72 hours.  Invalid input(s): "FREET3"   Other results:   Imaging    No results found.   Medications:     Scheduled Medications:  atorvastatin  20 mg Oral Daily   Chlorhexidine Gluconate Cloth  6 each Topical Daily   digoxin  0.125 mg Oral Daily   empagliflozin  25 mg Oral Daily   feeding supplement (GLUCERNA SHAKE)  237 mL Oral QAC lunch   furosemide  40 mg Oral BID   insulin aspart  0-15 Units Subcutaneous TID WC   insulin aspart  0-5 Units Subcutaneous QHS   insulin glargine-yfgn  30 Units Subcutaneous Daily    levothyroxine  25 mcg Oral Q0600   magnesium oxide  400 mg Oral BID   multivitamin with minerals  1 tablet Oral Daily   mupirocin ointment  1 Application Nasal BID   sacubitril-valsartan  1 tablet Oral BID   sertraline  50 mg Oral Daily   sodium chloride flush  10-40 mL Intracatheter Q12H   sodium chloride flush  3 mL Intravenous Q12H   spironolactone  25 mg Oral Daily    Infusions:  amiodarone 30 mg/hr (07/02/22 0618)   heparin 1,700 Units/hr (07/02/22 0618)   milrinone 0.375 mcg/kg/min (07/02/22 0945)    PRN Medications: acetaminophen, ondansetron (ZOFRAN) IV, sodium chloride flush   Assessment/Plan   1. Acute on chronic systolic CHF: Nonischemic cardiomyopathy, diagnosed 2020.  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  Cath in 12/20 with no significant coronary disease.  Medtronic ICD. Most recent echo in 2/23 showed EF 20-25% with normal RV.  Recently, patient has been symptomatically worse, NYHA class IV with profound orthopnea. RHC showed markedly elevated filling pressures, primarily pulmonary venous hypertension, low cardiac output, and low PAPI.  Patient was admitted for inotrope and diuresis. Echo this admission with EF <20%, mild RV dysfunction.   He was started on milrinone, co-ox 71%. CVP ok  - Continue milrinone 0.375 - Continue po lasix - Continue digoxin 0.125 daily.  - Continue Entresto 24/26 bid for now. May need to discontinue tomorrow to avoid post-operative vasoplegia. - Continue spironolactone 25 daily.  - Continue Jardiance.  - Hold beta blocker with low output/volume overload.  - Narrow QRS, not CRT candidate.  - Evaluation for LVAD placement, ongoing. Sounds like he may be tentatively planned for Tuesday 2. Atrial fibrillation/flutter: S/p flutter ablation.  He mainly had atrial  flutter.  He is in NS  today - Continue apixaban.  - Continue amiodarone gtt while on milrinone to try to keep him in rhythm.  3. H/o DVT: He is on apixaban.  4. NSVT - stable - Keep K>4, Mg >2  Length of Stay: 5  Glori Bickers MD 07/02/2022, 10:51 AM

## 2022-07-02 NOTE — Progress Notes (Signed)
Mobility Specialist Progress Note:   07/02/22 1551  Mobility  Activity Ambulated independently in hallway  Level of Assistance Modified independent, requires aide device or extra time  Assistive Device Four wheel walker  Distance Ambulated (ft) 400 ft  Activity Response Tolerated well  Mobility Referral Yes  $Mobility charge 1 Mobility   Pt received in bed and agreeable. No complaints. Pt left ambulating in room with all needs met and call bell in reach.   Andrey Campanile Mobility Specialist Please contact via SecureChat or  Rehab office at [phone]

## 2022-07-02 NOTE — Progress Notes (Signed)
Mobility Specialist Progress Note:   07/02/22 1004  Mobility  Activity Ambulated independently in hallway  Level of Assistance Modified independent, requires aide device or extra time  Assistive Device Four wheel walker  Distance Ambulated (ft) 400 ft  Activity Response Tolerated well  Mobility Referral Yes  $Mobility charge 1 Mobility   Pt received in bed and agreeable. No complaints. Pt left in bed with all needs met and call bell in reach.   Andrey Campanile Mobility Specialist Please contact via SecureChat or  Rehab office at [phone]

## 2022-07-02 NOTE — Progress Notes (Signed)
ANTICOAGULATION CONSULT NOTE  Pharmacy Consult for Heparin Indication: atrial fibrillation  No Known Allergies  Patient Measurements: Height: 5\' 11"  (180.3 cm) Weight: 90.7 kg (199 lb 15.3 oz) IBW/kg (Calculated) : 75.3 HEPARIN DW (KG): 91.1    Vital Signs: Temp: 98.1 F (36.7 C) (11/12 0340) Temp Source: Oral (11/12 0340) BP: 104/70 (11/12 0340) Pulse Rate: 68 (11/12 0006)  Labs: Recent Labs    06/29/22 0815 06/29/22 0815 06/30/22 0510 06/30/22 2035 07/01/22 0542 07/01/22 1200 07/01/22 1502 07/02/22 0505  HGB 17.7*  --   --   --  14.7  --   --  15.1  HCT 52.0  --   --   --  46.1  --   --  44.8  PLT  --   --   --   --  305  --   --  291  APTT  --   --   --  49* 68*  --  68*  --   HEPARINUNFRC  --    < >  --   --  0.39 QUESTIONABLE RESULTS, RECOMMEND RECOLLECT TO VERIFY 0.39 0.25*  CREATININE  --   --  1.20  --  1.14  --   --  1.11   < > = values in this interval not displayed.     Estimated Creatinine Clearance: 82.6 mL/min (by C-G formula based on SCr of 1.11 mg/dL).   Medical History: Past Medical History:  Diagnosis Date   Acute kidney injury ([place])    Acute respiratory failure ([facility])    Atrial flutter ([facility])    on Eliquis   CHF (congestive heart failure) ([place])    EF 20-25% 10/15/21, AICD in place.   Coronary artery disease    Diabetes mellitus without complication ([facility])    DVT (deep venous thrombosis) ([place]) 06/2019   Heart murmur    Hypertension    Presence of permanent cardiac pacemaker    Renal disorder       Assessment: 61yom admitted with heart failure and low cardiac output now stable on inotropes. Planning LVAD implant next week. Patient also with atrial fibrillation on apixaban for anticoagulation prior to admit with last dose take 11/6.  Bridging with heparin pre-op.    Heparin level subtherapeutic: 0.24 on 1500 units/hr, no issues with infusion or overt s/sx of bleeding per RN  Goal of Therapy:  Heparin level 0.3-0.7 units/ml Monitor  platelets by anticoagulation protocol: Yes   Plan:  Increase heparin gtt to 1700 units/hr Monitor daily heparin level, CBC Monitor for signs/symptoms of bleeding    Georga Bora, PharmD Clinical Pharmacist 07/02/2022 6:13 AM Please check AMION for all [place] numbers

## 2022-07-03 ENCOUNTER — Encounter: Payer: Self-pay | Admitting: *Deleted

## 2022-07-03 ENCOUNTER — Encounter (HOSPITAL_COMMUNITY): Payer: Medicaid Other

## 2022-07-03 DIAGNOSIS — I5023 Acute on chronic systolic (congestive) heart failure: Secondary | ICD-10-CM | POA: Diagnosis not present

## 2022-07-03 DIAGNOSIS — Z006 Encounter for examination for normal comparison and control in clinical research program: Secondary | ICD-10-CM

## 2022-07-03 LAB — COOXEMETRY PANEL
Carboxyhemoglobin: 0.9 % (ref 0.5–1.5)
Carboxyhemoglobin: 1.3 % (ref 0.5–1.5)
Carboxyhemoglobin: 1 % (ref 0.5–1.5)
Methemoglobin: 0.8 % (ref 0.0–1.5)
Methemoglobin: 1.1 % (ref 0.0–1.5)
Methemoglobin: 0.9 % (ref 0.0–1.5)
O2 Saturation: 56.2 %
O2 Saturation: 48.3 %
O2 Saturation: 64.8 %
Total hemoglobin: 16.1 g/dL — ABNORMAL HIGH (ref 12.0–16.0)
Total hemoglobin: 15.6 g/dL (ref 12.0–16.0)
Total hemoglobin: 14.7 g/dL (ref 12.0–16.0)

## 2022-07-03 LAB — HEPARIN LEVEL (UNFRACTIONATED)
Heparin Unfractionated: >1.1 IU/mL — ABNORMAL HIGH (ref 0.30–0.70)
Heparin Unfractionated: 0.43 IU/mL (ref 0.30–0.70)

## 2022-07-03 LAB — BASIC METABOLIC PANEL
Anion gap: 10 (ref 5–15)
BUN: 18 mg/dL (ref 6–20)
CO2: 22 mmol/L (ref 22–32)
Calcium: 8.3 mg/dL — ABNORMAL LOW (ref 8.9–10.3)
Chloride: 101 mmol/L (ref 98–111)
Creatinine, Ser: 1.12 mg/dL (ref 0.61–1.24)
GFR, Estimated: >60 mL/min (ref >60)
Glucose, Bld: 209 mg/dL — ABNORMAL HIGH (ref 70–99)
Potassium: 3.9 mmol/L (ref 3.5–5.1)
Sodium: 133 mmol/L — ABNORMAL LOW (ref 135–145)

## 2022-07-03 LAB — FACTOR 5 LEIDEN

## 2022-07-03 LAB — GLUCOSE, CAPILLARY
Glucose-Capillary: 177 mg/dL — ABNORMAL HIGH (ref 70–99)
Glucose-Capillary: 132 mg/dL — ABNORMAL HIGH (ref 70–99)
Glucose-Capillary: 151 mg/dL — ABNORMAL HIGH (ref 70–99)
Glucose-Capillary: 231 mg/dL — ABNORMAL HIGH (ref 70–99)

## 2022-07-03 LAB — CBC
HCT: 47.1 % (ref 39.0–52.0)
Hemoglobin: 15.5 g/dL (ref 13.0–17.0)
MCH: 28.1 pg (ref 26.0–34.0)
MCHC: 32.9 g/dL (ref 30.0–36.0)
MCV: 85.5 fL (ref 80.0–100.0)
Platelets: 322 10*3/uL (ref 150–400)
RBC: 5.51 MIL/uL (ref 4.22–5.81)
RDW: 15.2 % (ref 11.5–15.5)
WBC: 6.8 10*3/uL (ref 4.0–10.5)
nRBC: 0 % (ref 0.0–0.2)

## 2022-07-03 LAB — MRSA NEXT GEN BY PCR, NASAL: MRSA by PCR Next Gen: NOT DETECTED

## 2022-07-03 MED ORDER — VANCOMYCIN HCL 1 G IV SOLR
1000.0000 mg | INTRAVENOUS | Status: DC
  Filled 2022-07-03 (×3): qty 20

## 2022-07-03 MED ORDER — VASOPRESSIN 20 UNITS/100 ML INFUSION FOR SHOCK
0.0400 [IU]/min | INTRAVENOUS | Status: DC
  Filled 2022-07-03: qty 100

## 2022-07-03 MED ORDER — TRANEXAMIC ACID 1000 MG/10ML IV SOLN
1.5000 mg/kg/h | INTRAVENOUS | Status: AC
  Administered 2022-07-04: 1.5 mg/kg/h via INTRAVENOUS
  Filled 2022-07-03: qty 25

## 2022-07-03 MED ORDER — MUPIROCIN 2 % EX OINT
1.0000 | TOPICAL_OINTMENT | Freq: Two times a day (BID) | CUTANEOUS | Status: DC
  Administered 2022-07-03: 1 via NASAL

## 2022-07-03 MED ORDER — DEXMEDETOMIDINE HCL IN NACL 400 MCG/100ML IV SOLN
0.1000 ug/kg/h | INTRAVENOUS | Status: AC
  Administered 2022-07-04: .4 ug/kg/h via INTRAVENOUS
  Filled 2022-07-03: qty 100

## 2022-07-03 MED ORDER — POTASSIUM CHLORIDE CRYS ER 20 MEQ PO TBCR
20.0000 meq | EXTENDED_RELEASE_TABLET | Freq: Once | ORAL | Status: AC
  Administered 2022-07-03: 20 meq via ORAL
  Filled 2022-07-03: qty 1

## 2022-07-03 MED ORDER — HEPARIN 30,000 UNITS/1000 ML (OHS) CELLSAVER SOLUTION
Status: DC
  Filled 2022-07-03: qty 1000

## 2022-07-03 MED ORDER — BISACODYL 5 MG PO TBEC
5.0000 mg | DELAYED_RELEASE_TABLET | Freq: Once | ORAL | Status: AC
  Administered 2022-07-03: 5 mg via ORAL
  Filled 2022-07-03: qty 1

## 2022-07-03 MED ORDER — SODIUM CHLORIDE 0.9 % IV SOLN
600.0000 mg | INTRAVENOUS | Status: AC
  Administered 2022-07-04: 600 mg via INTRAVENOUS
  Filled 2022-07-03 (×3): qty 10

## 2022-07-03 MED ORDER — ORAL CARE MOUTH RINSE: 15.0000 mL | OROMUCOSAL | Status: DC | PRN

## 2022-07-03 MED ORDER — NOREPINEPHRINE 4 MG/250ML-% IV SOLN
0.0000 ug/min | INTRAVENOUS | Status: DC
  Filled 2022-07-03: qty 250

## 2022-07-03 MED ORDER — FLUCONAZOLE IN SODIUM CHLORIDE 400-0.9 MG/200ML-% IV SOLN
400.0000 mg | INTRAVENOUS | Status: AC
  Administered 2022-07-04: 400 mg via INTRAVENOUS
  Filled 2022-07-03 (×2): qty 200

## 2022-07-03 MED ORDER — POTASSIUM CHLORIDE 2 MEQ/ML IV SOLN
80.0000 meq | INTRAVENOUS | Status: DC
  Filled 2022-07-03: qty 40

## 2022-07-03 MED ORDER — CHLORHEXIDINE GLUCONATE 0.12 % MT SOLN
15.0000 mL | Freq: Once | OROMUCOSAL | Status: AC
  Administered 2022-07-04: 15 mL via OROMUCOSAL
  Filled 2022-07-03: qty 15

## 2022-07-03 MED ORDER — NITROGLYCERIN IN D5W 200-5 MCG/ML-% IV SOLN
0.0000 ug/min | INTRAVENOUS | Status: DC
  Filled 2022-07-03: qty 250

## 2022-07-03 MED ORDER — CEFAZOLIN SODIUM-DEXTROSE 2-4 GM/100ML-% IV SOLN
2.0000 g | INTRAVENOUS | Status: DC
  Filled 2022-07-03 (×2): qty 100

## 2022-07-03 MED ORDER — EPINEPHRINE HCL 5 MG/250ML IV SOLN IN NS
0.0000 ug/min | INTRAVENOUS | Status: AC
  Administered 2022-07-04: 2 ug/min via INTRAVENOUS
  Filled 2022-07-03: qty 250

## 2022-07-03 MED ORDER — CEFAZOLIN SODIUM-DEXTROSE 2-4 GM/100ML-% IV SOLN
2.0000 g | INTRAVENOUS | Status: AC
  Administered 2022-07-04 (×2): 2 g via INTRAVENOUS
  Filled 2022-07-03: qty 100

## 2022-07-03 MED ORDER — MILRINONE LACTATE IN DEXTROSE 20-5 MG/100ML-% IV SOLN
0.3750 ug/kg/min | INTRAVENOUS | Status: DC
  Administered 2022-07-03: 0.5 ug/kg/min via INTRAVENOUS
  Administered 2022-07-04: 0.3 ug/kg/min via INTRAVENOUS
  Administered 2022-07-04: 0.5 ug/kg/min via INTRAVENOUS
  Administered 2022-07-05 (×2): 0.3 ug/kg/min via INTRAVENOUS
  Administered 2022-07-06 (×2): 0.375 ug/kg/min via INTRAVENOUS
  Administered 2022-07-07: 0.25 ug/kg/min via INTRAVENOUS
  Administered 2022-07-07 – 2022-07-10 (×8): 0.375 ug/kg/min via INTRAVENOUS
  Filled 2022-07-03 (×12): qty 100
  Filled 2022-07-03: qty 200
  Filled 2022-07-03 (×2): qty 100

## 2022-07-03 MED ORDER — VANCOMYCIN HCL 1500 MG/300ML IV SOLN
1500.0000 mg | INTRAVENOUS | Status: AC
  Administered 2022-07-04: 1500 mg via INTRAVENOUS
  Filled 2022-07-03: qty 300

## 2022-07-03 MED ORDER — TRANEXAMIC ACID (OHS) PUMP PRIME SOLUTION
2.0000 mg/kg | INTRAVENOUS | Status: DC
  Filled 2022-07-03: qty 1.8

## 2022-07-03 MED ORDER — MANNITOL 20 % IV SOLN
INTRAVENOUS | Status: DC
  Filled 2022-07-03 (×2): qty 13

## 2022-07-03 MED ORDER — MILRINONE LACTATE IN DEXTROSE 20-5 MG/100ML-% IV SOLN
0.3000 ug/kg/min | INTRAVENOUS | Status: DC
  Filled 2022-07-03 (×2): qty 100

## 2022-07-03 MED ORDER — DOPAMINE-DEXTROSE 3.2-5 MG/ML-% IV SOLN
0.0000 ug/kg/min | INTRAVENOUS | Status: DC
  Filled 2022-07-03: qty 250

## 2022-07-03 MED ORDER — DOBUTAMINE IN D5W 4-5 MG/ML-% IV SOLN
2.0000 ug/kg/min | INTRAVENOUS | Status: DC
  Filled 2022-07-03 (×2): qty 250

## 2022-07-03 MED ORDER — TRANEXAMIC ACID (OHS) BOLUS VIA INFUSION
15.0000 mg/kg | INTRAVENOUS | Status: AC
  Administered 2022-07-04: 1348.5 mg via INTRAVENOUS
  Filled 2022-07-03: qty 1349

## 2022-07-03 MED ORDER — CHLORHEXIDINE GLUCONATE 4 % EX LIQD
60.0000 mL | Freq: Once | CUTANEOUS | Status: AC
  Administered 2022-07-03: 4 via TOPICAL
  Filled 2022-07-03: qty 60

## 2022-07-03 MED ORDER — TEMAZEPAM 15 MG PO CAPS
15.0000 mg | ORAL_CAPSULE | Freq: Once | ORAL | Status: AC | PRN
  Administered 2022-07-03: 15 mg via ORAL
  Filled 2022-07-03: qty 1

## 2022-07-03 MED ORDER — PHENYLEPHRINE HCL-NACL 20-0.9 MG/250ML-% IV SOLN
0.0000 ug/min | INTRAVENOUS | Status: AC
  Administered 2022-07-04: 10 ug/min via INTRAVENOUS
  Filled 2022-07-03: qty 250

## 2022-07-03 MED ORDER — INSULIN REGULAR(HUMAN) IN NACL 100-0.9 UT/100ML-% IV SOLN
INTRAVENOUS | Status: AC
  Administered 2022-07-04: 4.6 [IU]/h via INTRAVENOUS
  Filled 2022-07-03: qty 100

## 2022-07-03 MED ORDER — CHLORHEXIDINE GLUCONATE 4 % EX LIQD
60.0000 mL | Freq: Once | CUTANEOUS | Status: AC
  Administered 2022-07-04: 4 via TOPICAL
  Filled 2022-07-03: qty 60

## 2022-07-03 NOTE — Progress Notes (Addendum)
Darryl Frye has been discussed with the VAD Medical Review board on 07/03/22. The team feels as if the patient is a good candidate for Destination LVAD therapy due to uncontrolled diabetes. The patient meets criteria for a LVAD implant as listed below:  1) NYHA Class: _IV_documented on _11/08/23__(date)  2) Has a left ventricular ejection fraction (LVEF) < 25%   *EF_<20%_ by echo (date) _11/08/23_  3) Must meet one of the following:   Is inotrope dependent   *On inotropes_Milrinone_started_11/07/23_  OR  Has a Cardiac Index (CI) < 2.2  L/min/m2 while not on inotropes:   *CI:_1.31_RHC 11/07/23____   4) Must meet one of the following:   ___X___ Is on optimal medical management (OMM), based on current heart failure practice guidelines for at least 86 of the last 60 days and are failing to respond   ______ Has advanced heart failure for at least 14 days and are dependent on an intra-aortic balloon pump (IABP) or similar temporary mechanical circulatory support for at least 7 days      ____ IABP inserted (date) ____      ____ Impella inserted (date) ____  5)  Social work and palliative care evaluations demonstrate appropriate support system in place for discharge to home with a VAD and that end of life discussions have taken place. Both services have expressed no concern regarding patient's candidacy.         *Social work consult (date): 06/30/22        *Palliative Care Consult (date): 06/28/22  6)  Primary caretaker identified that can be taught along with the patient how to manage        the VAD equipment.        *Name: Darryl Frye  7)  Deemed appropriate by our financial coordinator: Darryl Frye        Prior approval: [id number]  8)  VAD Coordinator, Darryl Frye has met with patient and caregiver, shown them the VAD equipment and discussed with the patient and caregiver about lifestyle changes necessary for success on mechanical circulatory device.        *Met with  Darryl Frye on 06/22/22.       *Consent for VAD Evaluation/Caregiver Agreement/HIPPA Release/Photo Release signed on 06/22/22   9)  Six Minute Walk: 75 ft   10) KCCQ Pre VAD:  [facility] Cardiomyopathy Questionnaire  [name]    1 a. Ability to shower/bathe Not at all limited   1 b. Ability to walk 1 block Quite a bit limited   1 c. Ability to hurry/jog Extremely limited   2. Edema feet/ankles/legs Never over the past 2 weeks   3. Limited by fatigue 1-2 times per week   4. Limited by dyspnea At least once a day   5. Sitting up / on 3+ pillows 3 or more times per week but not everyday   6. Limited enjoyment of life Limited quite a bit   7. Rest of life w/ symptoms Not at all satisfied   8 a. Participation in hobbies Moderately limited   8 b. Participation in chores Moderately limited   8 c. Visiting family/friends Slightly limited    11)  Intermacs profile: 2  INTERMACS 1: Critical cardiogenic shock describes a patient who is "crashing and burning", in which a patient has life-threatening hypotension and rapidly escalating inotropic pressor support, with critical organ hypoperfusion often confirmed by worsening acidosis and lactate levels.  INTERMACS 2: Progressive decline describes a patient who has been demonstrated "dependent" on  inotropic support but nonetheless shows signs of continuing deterioration in nutrition, renal function, fluid retention, or other major status indicator. Patient profile 2 can also describe a patient with refractory volume overload, perhaps with evidence of impaired perfusion, in whom inotropic infusions cannot be maintained due to tachyarrhythmias, clinical ischemia, or other intolerance.  INTERMACS 3: Stable but inotrope dependent describes a patient who is clinically stable on mild-moderate doses of intravenous inotropes (or has a temporary circulatory support device) after repeated documentation of failure to wean without symptomatic hypotension,  worsening symptoms, or progressive organ dysfunction (usually renal). It is critical to monitor nutrition, renal function, fluid balance, and overall status carefully in order to distinguish between a   patient who is truly stable at Patient Profile 3 and a patient who has unappreciated decline rendering them Patient Profile 2. This patient may be either at home or in the hospital.      INTERMACS 4: Resting symptoms describes a patient who is at home on oral therapy but frequently has symptoms of congestion at rest or with activities of daily living (ADL). He or she may have orthopnea, shortness of breath during ADL such as dressing or bathing, gastrointestinal symptoms (abdominal discomfort, nausea, poor appetite), disabling ascites or severe lower extremity edema. This patient should be carefully considered for more intensive management and surveillance programs, which may in some cases, reveal poor compliance that would compromise outcomes with any therapy.   .   INTERMACS 5: Exertion Intolerant describes a patient who is comfortable at rest but unable to engage in any activity, living predominantly within the house or housebound. This patient has no congestive symptoms, but may have chronically elevated volume status, frequently with renal dysfunction, and may be characterized as exercise intolerant.      INTERMACS 6: Exertion Limited also describes a patient who is comfortable at rest without evidence of fluid overload, but who is able to do some mild activity. Activities of daily living are comfortable and minor activities outside the home such as visiting friends or going to a restaurant can be performed, but fatigue results within a few minutes of any meaningful physical exertion. This patient has occasional episodes of worsening symptoms and is likely to have had a hospitalization for heart failure within the past year.   INTERMACS 7: Advanced NYHA Class 3 describes a patient who is clinically  stable with a reasonable level of comfortable activity, despite history of previous decompensation that is not recent. This patient is usually able to walk more than a block. Any decompensation requiring intravenous diuretics or hospitalization within the previous month should make this person a Patient Profile 6 or lower.     Emerson Monte RN [place] Coordinator  Office: [phone]  24/7 Pager: [phone]

## 2022-07-03 NOTE — Progress Notes (Addendum)
Patient ID: Darryl Frye, male   DOB: 1961-01-11, 61 y.o.   MRN: [id number]     Advanced Heart Failure Rounding Note  PCP-Cardiologist: Carlyle Dolly, MD   Subjective:    On milrinone 0.328mcg/kg/min. Co-ox 71%>56%? Will repeat   Weight down 1lb. Now on po lasix  CVP <5  Feels great. Scr stable. Denies CP or SOB.   Repeat RHC 11/9: Procedural Findings (milrinone 0.25): Hemodynamics (mmHg) RA mean 1 RV 18/4 PA 29/16, mean 21 PCWP mean 8 Oxygen saturations: PA 66% AO 95% Cardiac Output (Fick) 4.58  Cardiac Index (Fick) 2.19 PVR 2.8 WU PAPI 13   RHC Procedural Findings 11/7: Hemodynamics (mmHg) RA mean 14 RV 49/24 PA 66/53, mean 54 PCWP mean 43 Oxygen saturations: PA 54% AO 93% Cardiac Output (Fick) 3.4  Cardiac Index (Fick) 1.62 PVR 3.2 WU Cardiac Output (Thermo) 2.75 Cardiac Index (Thermo) 1.31  PAPI 0.93   Objective:   Weight Range: 89.9 kg Body mass index is 27.64 kg/m.   Vital Signs:   Temp:  [97.6 F (36.4 C)-98.7 F (37.1 C)] 97.6 F (36.4 C) (11/13 0725) Pulse Rate:  [64] 64 (11/13 0725) Resp:  [17-21] 20 (11/13 0725) BP: (100-121)/(75-82) 111/76 (11/13 0725) SpO2:  [98 %] 98 % (11/12 1108) Weight:  [89.9 kg] 89.9 kg (11/13 0500) Last BM Date : 07/01/22  Weight change: Filed Weights   07/01/22 0345 07/02/22 0340 07/03/22 0500  Weight: 91.9 kg 90.7 kg 89.9 kg    Intake/Output:   Intake/Output Summary (Last 24 hours) at 07/03/2022 0830 Last data filed at 07/03/2022 0725 Gross per 24 hour  Intake 1455.36 ml  Output 700 ml  Net 755.36 ml    Physical Exam  CVP <5 General:  well appearing.  No respiratory difficulty HEENT: normal Neck: supple. No JVD. Carotids 2+ bilat; no bruits. No lymphadenopathy or thyromegaly appreciated. Cor: PMI nondisplaced. Regular rate & rhythm. No rubs, gallops or murmurs. Lungs: clear Abdomen: soft, nontender, nondistended. No hepatosplenomegaly. No bruits or masses. Good bowel sounds. Extremities:  no cyanosis, clubbing, rash, edema. PICC RUE Neuro: alert & oriented x 3, cranial nerves grossly intact. moves all 4 extremities w/o difficulty. Affect pleasant.   Telemetry   NSR 80s (Personally reviewed)    Labs    CBC Recent Labs    07/02/22 0505 07/03/22 0505  WBC 6.3 6.8  HGB 15.1 15.5  HCT 44.8 47.1  MCV 85.3 85.5  PLT 291 299   Basic Metabolic Panel Recent Labs    07/02/22 0505 07/03/22 0505  NA 136 133*  K 4.1 3.9  CL 102 101  CO2 24 22  GLUCOSE 105* 209*  BUN 17 18  CREATININE 1.11 1.12  CALCIUM 8.8* 8.3*   Liver Function Tests No results for input(s): "AST", "ALT", "ALKPHOS", "BILITOT", "PROT", "ALBUMIN" in the last 72 hours.  No results for input(s): "LIPASE", "AMYLASE" in the last 72 hours. Cardiac Enzymes No results for input(s): "CKTOTAL", "CKMB", "CKMBINDEX", "TROPONINI" in the last 72 hours.  BNP: BNP (last 3 results) Recent Labs    06/11/22 1706 06/22/22 1520 06/27/22 1511  BNP 414.0* 495.4* 333.8*    ProBNP (last 3 results) No results for input(s): "PROBNP" in the last 8760 hours.   D-Dimer No results for input(s): "DDIMER" in the last 72 hours. Hemoglobin A1C No results for input(s): "HGBA1C" in the last 72 hours.  Fasting Lipid Panel No results for input(s): "CHOL", "HDL", "LDLCALC", "TRIG", "CHOLHDL", "LDLDIRECT" in the last 72 hours.  Thyroid Function Tests  No results for input(s): "TSH", "T4TOTAL", "T3FREE", "THYROIDAB" in the last 72 hours.  Invalid input(s): "FREET3"   Other results:   Imaging    No results found.   Medications:     Scheduled Medications:  atorvastatin  20 mg Oral Daily   Chlorhexidine Gluconate Cloth  6 each Topical Daily   digoxin  0.125 mg Oral Daily   empagliflozin  25 mg Oral Daily   feeding supplement (GLUCERNA SHAKE)  237 mL Oral QAC lunch   furosemide  40 mg Oral BID   insulin aspart  0-15 Units Subcutaneous TID WC   insulin aspart  0-5 Units Subcutaneous QHS   insulin  glargine-yfgn  30 Units Subcutaneous Daily   levothyroxine  25 mcg Oral Q0600   magnesium oxide  400 mg Oral BID   multivitamin with minerals  1 tablet Oral Daily   sacubitril-valsartan  1 tablet Oral BID   sertraline  50 mg Oral Daily   sodium chloride flush  10-40 mL Intracatheter Q12H   sodium chloride flush  3 mL Intravenous Q12H   spironolactone  25 mg Oral Daily    Infusions:  amiodarone 30 mg/hr (07/02/22 2333)   heparin 1,700 Units/hr (07/03/22 0516)   milrinone 0.375 mcg/kg/min (07/03/22 0932)    PRN Medications: acetaminophen, ondansetron (ZOFRAN) IV, mouth rinse, sodium chloride flush   Assessment/Plan   1. Acute on chronic systolic CHF: Nonischemic cardiomyopathy, diagnosed 2020.  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  Cath in 12/20 with no significant coronary disease.  [employer] ICD. Most recent echo in 2/23 showed EF 20-25% with normal RV.  Recently, patient has been symptomatically worse, NYHA class IV with profound orthopnea. RHC showed markedly elevated filling pressures, primarily pulmonary venous hypertension, low cardiac output, and low PAPI.  Patient was admitted for inotrope and diuresis. Echo this admission with EF <20%, mild RV dysfunction.  He was started on milrinone, co-ox 56%. CVP <5 - Continue milrinone 0.375 - Continue po lasix this am, hold evening dose to avoid hypovolemia during tomorrow's procedure - Continue digoxin 0.125 daily.  - Hold Entresto 24/26 bid for now to avoid post-operative vasoplegia. - Continue spironolactone 25 daily.  - Continue Jardiance.  - Hold beta blocker with low output/volume overload.  - Narrow QRS, not CRT candidate.  - Evaluation for LVAD placement, ongoing. As of now on the schedule for tomorrow for LVAD placement. Ex-wife and friend will be  there to take care of him and help during recovery. 2. Atrial fibrillation/flutter: S/p flutter ablation.  He mainly had atrial flutter.  He is in NSR today - Apixaban held with upcoming procedure, continue hep gtt.  - Continue amiodarone gtt while on milrinone to try to keep him in rhythm.  3. H/o DVT: on Hep gtt, previously on apixaban 4. NSVT - stable - Keep K>4, Mg >2  Length of Stay: [place] AGACNP-BC  07/03/2022, 8:30 AM  Patient seen with NP, agree with the above note.   No complaints this morning, walking no dyspnea walking in halls.   He remains on milrinone 0.375, early am co-ox 56%. CVP < 5.    General: NAD Neck: No JVD, no thyromegaly or thyroid nodule.  Lungs: Clear to auscultation bilaterally with normal respiratory effort. CV: Lateral PMI.  Heart regular S1/S2, no S3/S4,  no murmur.  No peripheral edema.   Abdomen: Soft, nontender, no hepatosplenomegaly, no distention.  Skin: Intact without lesions or rashes.  Neurologic: Alert and oriented x 3.  Psych: Normal affect. Extremities: No clubbing or cyanosis.  HEENT: Normal.   Patient appears stable today.  Would repeat co-ox now.  Continue milrinone 0.375.  Agree with holding Entresto today.  Will also hold Lasix after morning dose.   He remains in NSR on amiodarone gtt and heparin gtt.   Plan for LVAD placement tomorrow.  Workup is completed.  Dr. Tenny Craw following. Discussed with patient.   Loralie Champagne 07/03/2022 9:36 AM

## 2022-07-03 NOTE — Progress Notes (Signed)
This chaplain is present for F/U spiritual care before the Pt. surgery on Tuesday. The chaplain listened reflectively as the Pt. storytelling reflects his joy in life. The Pt. shares the joy through his faith is meant to share with others.   The chaplain left a blessing with the Pt. and is available for F/U spiritual care as needed.  Chaplain Sallyanne Kuster [phone]

## 2022-07-03 NOTE — Plan of Care (Signed)

## 2022-07-03 NOTE — Progress Notes (Signed)
Mobility Specialist Progress Note    07/03/22 0946  Mobility  Activity Ambulated independently in hallway  Level of Assistance Modified independent, requires aide device or extra time  Assistive Device Four wheel walker  Distance Ambulated (ft) 800 ft  Activity Response Tolerated well  Mobility Referral Yes  $Mobility charge 1 Mobility   Pre-Mobility: 69 HR During Mobility: 91 HR Post-Mobility: 83 HR  Pt received in bed and agreeable. No complaints on walk. Returned to standing at sink.   Hildred Alamin Mobility Specialist  Please Psychologist, sport and exercise or Rehab Office at [phone]

## 2022-07-03 NOTE — Progress Notes (Signed)
Co-ox this morning 56%, repeat came back at 48%. Discussed with Dr. Aundra Dubin, will increase milrinone to .5 with repeat co-ox ordered.   [name], AGACNP-BC  Advanced Heart Failure Team

## 2022-07-03 NOTE — Progress Notes (Signed)
ANTICOAGULATION CONSULT NOTE  Pharmacy Consult for Heparin Indication: atrial fibrillation  No Known Allergies  Patient Measurements: Height: 5\' 11"  (180.3 cm) Weight: 89.9 kg (198 lb 3.1 oz) IBW/kg (Calculated) : 75.3 HEPARIN DW (KG): 91.1    Vital Signs: Temp: 98 F (36.7 C) (11/13 1106) Temp Source: Oral (11/13 1106) BP: 116/79 (11/13 1106) Pulse Rate: 71 (11/13 1106)  Labs: Recent Labs     0000 06/30/22 2035 07/01/22 0542 07/01/22 1200 07/01/22 1502 07/02/22 0505 07/03/22 0505 07/03/22 0622  HGB   < >  --  14.7  --   --  15.1 15.5  --   HCT  --   --  46.1  --   --  44.8 47.1  --   PLT  --   --  305  --   --  291 322  --   APTT  --  49* 68*  --  68*  --   --   --   HEPARINUNFRC  --   --  0.39   < > 0.39 0.25* >1.10* 0.43  CREATININE  --   --  1.14  --   --  1.11 1.12  --    < > = values in this interval not displayed.     Estimated Creatinine Clearance: 75.6 mL/min (by C-G formula based on SCr of 1.12 mg/dL).   Medical History: Past Medical History:  Diagnosis Date   Acute kidney injury ([place])    Acute respiratory failure (HCC)    Atrial flutter (HCC)    on Eliquis   CHF (congestive heart failure) ([place])    EF 20-25% 10/15/21, AICD in place.   Coronary artery disease    Diabetes mellitus without complication (HCC)    DVT (deep venous thrombosis) ([place]) 06/2019   Heart murmur    Hypertension    Presence of permanent cardiac pacemaker    Renal disorder       Assessment: 61yom admitted with heart failure and low cardiac output now stable on inotropes. Planning LVAD implant next week. Patient also with atrial fibrillation on apixaban for anticoagulation prior to admit with last dose take 11/6.  Bridging with heparin pre-op.    Heparin level at goal this AM on 1700 units/hr, no issues with infusion or overt s/sx of bleeding per RN  Goal of Therapy:  Heparin level 0.3-0.7 units/ml Monitor platelets by anticoagulation protocol: Yes   Plan:  Continue  heparin gtt at 1700 units/hr Monitor daily heparin level, CBC Monitor for signs/symptoms of bleeding    Marguerite Olea, [facility] Clinical Pharmacist  07/03/2022 11:32 AM   [facility] pharmacy phone numbers are listed on [email or web address]

## 2022-07-03 NOTE — Plan of Care (Signed)
  Problem: Activity: Goal: Ability to return to baseline activity level will improve Outcome: Progressing   Problem: Health Behavior/Discharge Planning: Goal: Ability to safely manage health-related needs after discharge will improve Outcome: Progressing   Problem: Coping: Goal: Ability to adjust to condition or change in health will improve Outcome: Progressing   Problem: Fluid Volume: Goal: Ability to maintain a balanced intake and output will improve Outcome: Progressing   Problem: Nutritional: Goal: Maintenance of adequate nutrition will improve Outcome: Progressing   Problem: Skin Integrity: Goal: Risk for impaired skin integrity will decrease Outcome: Progressing   Problem: Education: Goal: Knowledge of General Education information will improve Description: Including pain rating scale, medication(s)/side effects and non-pharmacologic comfort measures Outcome: Progressing   Problem: Health Behavior/Discharge Planning: Goal: Ability to manage health-related needs will improve Outcome: Progressing   Problem: Clinical Measurements: Goal: Ability to maintain clinical measurements within normal limits will improve Outcome: Progressing Goal: Will remain free from infection Outcome: Progressing Goal: Diagnostic test results will improve Outcome: Progressing   Problem: Activity: Goal: Risk for activity intolerance will decrease Outcome: Progressing   Problem: Nutrition: Goal: Adequate nutrition will be maintained Outcome: Progressing   Problem: Coping: Goal: Level of anxiety will decrease Outcome: Progressing   Problem: Pain Managment: Goal: General experience of comfort will improve Outcome: Progressing

## 2022-07-04 ENCOUNTER — Inpatient Hospital Stay (HOSPITAL_COMMUNITY): Payer: Medicare Other

## 2022-07-04 ENCOUNTER — Inpatient Hospital Stay (HOSPITAL_COMMUNITY): Payer: Medicare Other | Admitting: Anesthesiology

## 2022-07-04 ENCOUNTER — Other Ambulatory Visit: Payer: Self-pay

## 2022-07-04 ENCOUNTER — Inpatient Hospital Stay (HOSPITAL_COMMUNITY)
Admission: AD | Disposition: A | Discharge status: Rehabilitation Facility | Payer: Self-pay | Source: Home / Self Care | Attending: Cardiothoracic Surgery

## 2022-07-04 DIAGNOSIS — Z9889 Other specified postprocedural states: Secondary | ICD-10-CM

## 2022-07-04 DIAGNOSIS — E1122 Type 2 diabetes mellitus with diabetic chronic kidney disease: Secondary | ICD-10-CM

## 2022-07-04 DIAGNOSIS — I2510 Atherosclerotic heart disease of native coronary artery without angina pectoris: Secondary | ICD-10-CM

## 2022-07-04 DIAGNOSIS — I509 Heart failure, unspecified: Secondary | ICD-10-CM | POA: Diagnosis not present

## 2022-07-04 DIAGNOSIS — I255 Ischemic cardiomyopathy: Secondary | ICD-10-CM

## 2022-07-04 DIAGNOSIS — R570 Cardiogenic shock: Secondary | ICD-10-CM | POA: Diagnosis not present

## 2022-07-04 DIAGNOSIS — I130 Hypertensive heart and chronic kidney disease with heart failure and stage 1 through stage 4 chronic kidney disease, or unspecified chronic kidney disease: Secondary | ICD-10-CM | POA: Diagnosis not present

## 2022-07-04 DIAGNOSIS — Z7984 Long term (current) use of oral hypoglycemic drugs: Secondary | ICD-10-CM

## 2022-07-04 DIAGNOSIS — N189 Chronic kidney disease, unspecified: Secondary | ICD-10-CM

## 2022-07-04 DIAGNOSIS — I4891 Unspecified atrial fibrillation: Secondary | ICD-10-CM

## 2022-07-04 HISTORY — PX: TEE WITHOUT CARDIOVERSION: SHX5443

## 2022-07-04 HISTORY — PX: INSERTION OF IMPLANTABLE LEFT VENTRICULAR ASSIST DEVICE: SHX5866

## 2022-07-04 LAB — POCT I-STAT, CHEM 8
BUN: 21 mg/dL — ABNORMAL HIGH (ref 6–20)
BUN: 21 mg/dL — ABNORMAL HIGH (ref 6–20)
BUN: 19 mg/dL (ref 6–20)
BUN: 19 mg/dL (ref 6–20)
Calcium, Ion: 1.29 mmol/L (ref 1.15–1.40)
Calcium, Ion: 1.19 mmol/L (ref 1.15–1.40)
Calcium, Ion: 1.09 mmol/L — ABNORMAL LOW (ref 1.15–1.40)
Calcium, Ion: 1.01 mmol/L — ABNORMAL LOW (ref 1.15–1.40)
Chloride: 101 mmol/L (ref 98–111)
Chloride: 101 mmol/L (ref 98–111)
Chloride: 99 mmol/L (ref 98–111)
Chloride: 99 mmol/L (ref 98–111)
Creatinine, Ser: 1 mg/dL (ref 0.61–1.24)
Creatinine, Ser: 1 mg/dL (ref 0.61–1.24)
Creatinine, Ser: 0.9 mg/dL (ref 0.61–1.24)
Creatinine, Ser: 0.9 mg/dL (ref 0.61–1.24)
Glucose, Bld: 151 mg/dL — ABNORMAL HIGH (ref 70–99)
Glucose, Bld: 193 mg/dL — ABNORMAL HIGH (ref 70–99)
Glucose, Bld: 162 mg/dL — ABNORMAL HIGH (ref 70–99)
Glucose, Bld: 159 mg/dL — ABNORMAL HIGH (ref 70–99)
HCT: 47 % (ref 39.0–52.0)
HCT: 46 % (ref 39.0–52.0)
HCT: 39 % (ref 39.0–52.0)
HCT: 33 % — ABNORMAL LOW (ref 39.0–52.0)
Hemoglobin: 16 g/dL (ref 13.0–17.0)
Hemoglobin: 15.6 g/dL (ref 13.0–17.0)
Hemoglobin: 13.3 g/dL (ref 13.0–17.0)
Hemoglobin: 11.2 g/dL — ABNORMAL LOW (ref 13.0–17.0)
Potassium: 4.1 mmol/L (ref 3.5–5.1)
Potassium: 4.5 mmol/L (ref 3.5–5.1)
Potassium: 4.5 mmol/L (ref 3.5–5.1)
Potassium: 3.5 mmol/L (ref 3.5–5.1)
Sodium: 138 mmol/L (ref 135–145)
Sodium: 135 mmol/L (ref 135–145)
Sodium: 133 mmol/L — ABNORMAL LOW (ref 135–145)
Sodium: 137 mmol/L (ref 135–145)
TCO2: 27 mmol/L (ref 22–32)
TCO2: 23 mmol/L (ref 22–32)
TCO2: 26 mmol/L (ref 22–32)
TCO2: 24 mmol/L (ref 22–32)

## 2022-07-04 LAB — POCT I-STAT 7, (LYTES, BLD GAS, ICA,H+H)
Acid-base deficit: 2 mmol/L (ref 0.0–2.0)
Acid-base deficit: 1 mmol/L (ref 0.0–2.0)
Acid-base deficit: 1 mmol/L (ref 0.0–2.0)
Acid-base deficit: 4 mmol/L — ABNORMAL HIGH (ref 0.0–2.0)
Acid-base deficit: 5 mmol/L — ABNORMAL HIGH (ref 0.0–2.0)
Acid-base deficit: 6 mmol/L — ABNORMAL HIGH (ref 0.0–2.0)
Acid-base deficit: 4 mmol/L — ABNORMAL HIGH (ref 0.0–2.0)
Acid-base deficit: 6 mmol/L — ABNORMAL HIGH (ref 0.0–2.0)
Bicarbonate: 25.3 mmol/L (ref 20.0–28.0)
Bicarbonate: 24.5 mmol/L (ref 20.0–28.0)
Bicarbonate: 23.3 mmol/L (ref 20.0–28.0)
Bicarbonate: 22.3 mmol/L (ref 20.0–28.0)
Bicarbonate: 23 mmol/L (ref 20.0–28.0)
Bicarbonate: 22 mmol/L (ref 20.0–28.0)
Bicarbonate: 22.7 mmol/L (ref 20.0–28.0)
Bicarbonate: 19.4 mmol/L — ABNORMAL LOW (ref 20.0–28.0)
Calcium, Ion: 1.31 mmol/L (ref 1.15–1.40)
Calcium, Ion: 0.99 mmol/L — ABNORMAL LOW (ref 1.15–1.40)
Calcium, Ion: 0.93 mmol/L — ABNORMAL LOW (ref 1.15–1.40)
Calcium, Ion: 1.02 mmol/L — ABNORMAL LOW (ref 1.15–1.40)
Calcium, Ion: 1.07 mmol/L — ABNORMAL LOW (ref 1.15–1.40)
Calcium, Ion: 1.01 mmol/L — ABNORMAL LOW (ref 1.15–1.40)
Calcium, Ion: 1.03 mmol/L — ABNORMAL LOW (ref 1.15–1.40)
Calcium, Ion: 1.11 mmol/L — ABNORMAL LOW (ref 1.15–1.40)
HCT: 49 % (ref 39.0–52.0)
HCT: 34 % — ABNORMAL LOW (ref 39.0–52.0)
HCT: 31 % — ABNORMAL LOW (ref 39.0–52.0)
HCT: 33 % — ABNORMAL LOW (ref 39.0–52.0)
HCT: 36 % — ABNORMAL LOW (ref 39.0–52.0)
HCT: 34 % — ABNORMAL LOW (ref 39.0–52.0)
HCT: 32 % — ABNORMAL LOW (ref 39.0–52.0)
HCT: 33 % — ABNORMAL LOW (ref 39.0–52.0)
Hemoglobin: 16.7 g/dL (ref 13.0–17.0)
Hemoglobin: 11.6 g/dL — ABNORMAL LOW (ref 13.0–17.0)
Hemoglobin: 10.5 g/dL — ABNORMAL LOW (ref 13.0–17.0)
Hemoglobin: 11.2 g/dL — ABNORMAL LOW (ref 13.0–17.0)
Hemoglobin: 12.2 g/dL — ABNORMAL LOW (ref 13.0–17.0)
Hemoglobin: 11.6 g/dL — ABNORMAL LOW (ref 13.0–17.0)
Hemoglobin: 10.9 g/dL — ABNORMAL LOW (ref 13.0–17.0)
Hemoglobin: 11.2 g/dL — ABNORMAL LOW (ref 13.0–17.0)
O2 Saturation: 99 %
O2 Saturation: 100 %
O2 Saturation: 100 %
O2 Saturation: 99 %
O2 Saturation: 96 %
O2 Saturation: 95 %
O2 Saturation: 98 %
O2 Saturation: 96 %
Patient temperature: 35.6
Patient temperature: 36.8
Patient temperature: 36.8
Patient temperature: 37
Potassium: 4.3 mmol/L (ref 3.5–5.1)
Potassium: 4.4 mmol/L (ref 3.5–5.1)
Potassium: 4.6 mmol/L (ref 3.5–5.1)
Potassium: 3.4 mmol/L — ABNORMAL LOW (ref 3.5–5.1)
Potassium: 3.3 mmol/L — ABNORMAL LOW (ref 3.5–5.1)
Potassium: 3.8 mmol/L (ref 3.5–5.1)
Potassium: 4.1 mmol/L (ref 3.5–5.1)
Potassium: 4.2 mmol/L (ref 3.5–5.1)
Sodium: 135 mmol/L (ref 135–145)
Sodium: 134 mmol/L — ABNORMAL LOW (ref 135–145)
Sodium: 134 mmol/L — ABNORMAL LOW (ref 135–145)
Sodium: 136 mmol/L (ref 135–145)
Sodium: 138 mmol/L (ref 135–145)
Sodium: 138 mmol/L (ref 135–145)
Sodium: 137 mmol/L (ref 135–145)
Sodium: 137 mmol/L (ref 135–145)
TCO2: 27 mmol/L (ref 22–32)
TCO2: 26 mmol/L (ref 22–32)
TCO2: 24 mmol/L (ref 22–32)
TCO2: 24 mmol/L (ref 22–32)
TCO2: 25 mmol/L (ref 22–32)
TCO2: 23 mmol/L (ref 22–32)
TCO2: 24 mmol/L (ref 22–32)
TCO2: 21 mmol/L — ABNORMAL LOW (ref 22–32)
pCO2 arterial: 52.7 mmHg — ABNORMAL HIGH (ref 32–48)
pCO2 arterial: 43.8 mmHg (ref 32–48)
pCO2 arterial: 38.1 mmHg (ref 32–48)
pCO2 arterial: 43.9 mmHg (ref 32–48)
pCO2 arterial: 53.9 mmHg — ABNORMAL HIGH (ref 32–48)
pCO2 arterial: 50.6 mmHg — ABNORMAL HIGH (ref 32–48)
pCO2 arterial: 44.8 mmHg (ref 32–48)
pCO2 arterial: 38.9 mmHg (ref 32–48)
pH, Arterial: 7.29 — ABNORMAL LOW (ref 7.35–7.45)
pH, Arterial: 7.356 (ref 7.35–7.45)
pH, Arterial: 7.395 (ref 7.35–7.45)
pH, Arterial: 7.315 — ABNORMAL LOW (ref 7.35–7.45)
pH, Arterial: 7.23 — ABNORMAL LOW (ref 7.35–7.45)
pH, Arterial: 7.245 — ABNORMAL LOW (ref 7.35–7.45)
pH, Arterial: 7.313 — ABNORMAL LOW (ref 7.35–7.45)
pH, Arterial: 7.305 — ABNORMAL LOW (ref 7.35–7.45)
pO2, Arterial: 153 mmHg — ABNORMAL HIGH (ref 83–108)
pO2, Arterial: 349 mmHg — ABNORMAL HIGH (ref 83–108)
pO2, Arterial: 322 mmHg — ABNORMAL HIGH (ref 83–108)
pO2, Arterial: 154 mmHg — ABNORMAL HIGH (ref 83–108)
pO2, Arterial: 92 mmHg (ref 83–108)
pO2, Arterial: 87 mmHg (ref 83–108)
pO2, Arterial: 109 mmHg — ABNORMAL HIGH (ref 83–108)
pO2, Arterial: 91 mmHg (ref 83–108)

## 2022-07-04 LAB — BLOOD GAS, ARTERIAL
Acid-Base Excess: 2 mmol/L (ref 0.0–2.0)
Bicarbonate: 25.6 mmol/L (ref 20.0–28.0)
O2 Saturation: 98.7 %
Patient temperature: 37
pCO2 arterial: 36 mmHg (ref 32–48)
pH, Arterial: 7.46 — ABNORMAL HIGH (ref 7.35–7.45)
pO2, Arterial: 87 mmHg (ref 83–108)

## 2022-07-04 LAB — BASIC METABOLIC PANEL
Anion gap: 10 (ref 5–15)
Anion gap: 10 (ref 5–15)
Anion gap: 11 (ref 5–15)
BUN: 25 mg/dL — ABNORMAL HIGH (ref 6–20)
BUN: 16 mg/dL (ref 6–20)
BUN: 15 mg/dL (ref 6–20)
CO2: 24 mmol/L (ref 22–32)
CO2: 21 mmol/L — ABNORMAL LOW (ref 22–32)
CO2: 19 mmol/L — ABNORMAL LOW (ref 22–32)
Calcium: 9.1 mg/dL (ref 8.9–10.3)
Calcium: 7.2 mg/dL — ABNORMAL LOW (ref 8.9–10.3)
Calcium: 8 mg/dL — ABNORMAL LOW (ref 8.9–10.3)
Chloride: 102 mmol/L (ref 98–111)
Chloride: 105 mmol/L (ref 98–111)
Chloride: 106 mmol/L (ref 98–111)
Creatinine, Ser: 1.28 mg/dL — ABNORMAL HIGH (ref 0.61–1.24)
Creatinine, Ser: 1.13 mg/dL (ref 0.61–1.24)
Creatinine, Ser: 1.26 mg/dL — ABNORMAL HIGH (ref 0.61–1.24)
GFR, Estimated: >60 mL/min (ref >60)
GFR, Estimated: >60 mL/min (ref >60)
GFR, Estimated: >60 mL/min (ref >60)
Glucose, Bld: 133 mg/dL — ABNORMAL HIGH (ref 70–99)
Glucose, Bld: 163 mg/dL — ABNORMAL HIGH (ref 70–99)
Glucose, Bld: 141 mg/dL — ABNORMAL HIGH (ref 70–99)
Potassium: 4 mmol/L (ref 3.5–5.1)
Potassium: 3.2 mmol/L — ABNORMAL LOW (ref 3.5–5.1)
Potassium: 4.2 mmol/L (ref 3.5–5.1)
Sodium: 136 mmol/L (ref 135–145)
Sodium: 136 mmol/L (ref 135–145)
Sodium: 136 mmol/L (ref 135–145)

## 2022-07-04 LAB — POCT I-STAT EG7
Acid-base deficit: 4 mmol/L — ABNORMAL HIGH (ref 0.0–2.0)
Bicarbonate: 22.3 mmol/L (ref 20.0–28.0)
Calcium, Ion: 0.99 mmol/L — ABNORMAL LOW (ref 1.15–1.40)
HCT: 35 % — ABNORMAL LOW (ref 39.0–52.0)
Hemoglobin: 11.9 g/dL — ABNORMAL LOW (ref 13.0–17.0)
O2 Saturation: 75 %
Potassium: 4 mmol/L (ref 3.5–5.1)
Sodium: 135 mmol/L (ref 135–145)
TCO2: 24 mmol/L (ref 22–32)
pCO2, Ven: 45.5 mmHg (ref 44–60)
pH, Ven: 7.298 (ref 7.25–7.43)
pO2, Ven: 44 mmHg (ref 32–45)

## 2022-07-04 LAB — COOXEMETRY PANEL
Carboxyhemoglobin: 1.3 % (ref 0.5–1.5)
Carboxyhemoglobin: 2.4 % — ABNORMAL HIGH (ref 0.5–1.5)
Carboxyhemoglobin: 1.8 % — ABNORMAL HIGH (ref 0.5–1.5)
Methemoglobin: <0.7 % (ref 0.0–1.5)
Methemoglobin: <0.7 % (ref 0.0–1.5)
Methemoglobin: <0.7 % (ref 0.0–1.5)
O2 Saturation: 63.7 %
O2 Saturation: 97.8 %
O2 Saturation: 69.7 %
Total hemoglobin: 15.5 g/dL (ref 12.0–16.0)
Total hemoglobin: 11.3 g/dL — ABNORMAL LOW (ref 12.0–16.0)
Total hemoglobin: 11.1 g/dL — ABNORMAL LOW (ref 12.0–16.0)

## 2022-07-04 LAB — CBC
HCT: 45.3 % (ref 39.0–52.0)
HCT: 34.5 % — ABNORMAL LOW (ref 39.0–52.0)
HCT: 33.3 % — ABNORMAL LOW (ref 39.0–52.0)
Hemoglobin: 15.2 g/dL (ref 13.0–17.0)
Hemoglobin: 11.1 g/dL — ABNORMAL LOW (ref 13.0–17.0)
Hemoglobin: 10.5 g/dL — ABNORMAL LOW (ref 13.0–17.0)
MCH: 28.6 pg (ref 26.0–34.0)
MCH: 28.2 pg (ref 26.0–34.0)
MCH: 28 pg (ref 26.0–34.0)
MCHC: 33.6 g/dL (ref 30.0–36.0)
MCHC: 32.2 g/dL (ref 30.0–36.0)
MCHC: 31.5 g/dL (ref 30.0–36.0)
MCV: 85.3 fL (ref 80.0–100.0)
MCV: 87.8 fL (ref 80.0–100.0)
MCV: 88.8 fL (ref 80.0–100.0)
Platelets: 321 10*3/uL (ref 150–400)
Platelets: 220 10*3/uL (ref 150–400)
Platelets: 224 10*3/uL (ref 150–400)
RBC: 5.31 MIL/uL (ref 4.22–5.81)
RBC: 3.93 MIL/uL — ABNORMAL LOW (ref 4.22–5.81)
RBC: 3.75 MIL/uL — ABNORMAL LOW (ref 4.22–5.81)
RDW: 15 % (ref 11.5–15.5)
RDW: 15 % (ref 11.5–15.5)
RDW: 15.2 % (ref 11.5–15.5)
WBC: 7.5 10*3/uL (ref 4.0–10.5)
WBC: 15.7 10*3/uL — ABNORMAL HIGH (ref 4.0–10.5)
WBC: 11.6 10*3/uL — ABNORMAL HIGH (ref 4.0–10.5)
nRBC: 0 % (ref 0.0–0.2)
nRBC: 0 % (ref 0.0–0.2)
nRBC: 0 % (ref 0.0–0.2)

## 2022-07-04 LAB — PROTIME-INR
INR: 1.3 — ABNORMAL HIGH (ref 0.8–1.2)
Prothrombin Time: 15.8 seconds — ABNORMAL HIGH (ref 11.4–15.2)

## 2022-07-04 LAB — MAGNESIUM
Magnesium: 2 mg/dL (ref 1.7–2.4)
Magnesium: 2.9 mg/dL — ABNORMAL HIGH (ref 1.7–2.4)

## 2022-07-04 LAB — GLUCOSE, CAPILLARY
Glucose-Capillary: 154 mg/dL — ABNORMAL HIGH (ref 70–99)
Glucose-Capillary: 162 mg/dL — ABNORMAL HIGH (ref 70–99)
Glucose-Capillary: 153 mg/dL — ABNORMAL HIGH (ref 70–99)
Glucose-Capillary: 166 mg/dL — ABNORMAL HIGH (ref 70–99)
Glucose-Capillary: 149 mg/dL — ABNORMAL HIGH (ref 70–99)
Glucose-Capillary: 148 mg/dL — ABNORMAL HIGH (ref 70–99)
Glucose-Capillary: 162 mg/dL — ABNORMAL HIGH (ref 70–99)
Glucose-Capillary: 148 mg/dL — ABNORMAL HIGH (ref 70–99)
Glucose-Capillary: 151 mg/dL — ABNORMAL HIGH (ref 70–99)
Glucose-Capillary: 129 mg/dL — ABNORMAL HIGH (ref 70–99)
Glucose-Capillary: 135 mg/dL — ABNORMAL HIGH (ref 70–99)

## 2022-07-04 LAB — DIC (DISSEMINATED INTRAVASCULAR COAGULATION)PANEL
D-Dimer, Quant: 1.02 ug/mL-FEU — ABNORMAL HIGH (ref 0.00–0.50)
Fibrinogen: 374 mg/dL (ref 210–475)
INR: 1.3 — ABNORMAL HIGH (ref 0.8–1.2)
Platelets: 213 10*3/uL (ref 150–400)
Prothrombin Time: 16.2 seconds — ABNORMAL HIGH (ref 11.4–15.2)
Smear Review: NONE SEEN
aPTT: 31 seconds (ref 24–36)

## 2022-07-04 LAB — FIBRINOGEN
Fibrinogen: 324 mg/dL (ref 210–475)
Fibrinogen: 366 mg/dL (ref 210–475)

## 2022-07-04 LAB — HEMOGLOBIN AND HEMATOCRIT, BLOOD
HCT: 29.8 % — ABNORMAL LOW (ref 39.0–52.0)
HCT: 33.1 % — ABNORMAL LOW (ref 39.0–52.0)
Hemoglobin: 9.7 g/dL (ref 13.0–17.0)
Hemoglobin: 11.1 g/dL — ABNORMAL LOW (ref 13.0–17.0)

## 2022-07-04 LAB — URINALYSIS, ROUTINE W REFLEX MICROSCOPIC
Bacteria, UA: NONE SEEN
Bilirubin Urine: NEGATIVE
Glucose, UA: >=500 mg/dL — AB
Hgb urine dipstick: NEGATIVE
Ketones, ur: NEGATIVE mg/dL
Leukocytes,Ua: NEGATIVE
Nitrite: NEGATIVE
Protein, ur: NEGATIVE mg/dL
Specific Gravity, Urine: 1.011 (ref 1.005–1.030)
pH: 6 (ref 5.0–8.0)

## 2022-07-04 LAB — HEPARIN LEVEL (UNFRACTIONATED): Heparin Unfractionated: 0.45 IU/mL (ref 0.30–0.70)

## 2022-07-04 LAB — APTT: aPTT: 31 seconds (ref 24–36)

## 2022-07-04 LAB — PLATELET COUNT
Platelets: 224 10*3/uL (ref 150–400)
Platelets: 189 10*3/uL (ref 150–400)

## 2022-07-04 LAB — PREPARE RBC (CROSSMATCH)

## 2022-07-04 SURGERY — INSERTION OF IMPLANTABLE LEFT VENTRICULAR ASSIST DEVICE
Anesthesia: General

## 2022-07-04 MED ORDER — VANCOMYCIN HCL 1000 MG IV SOLR
INTRAVENOUS | Status: AC
  Filled 2022-07-04: qty 20

## 2022-07-04 MED ORDER — ASPIRIN 81 MG PO CHEW
324.0000 mg | CHEWABLE_TABLET | Freq: Every day | ORAL | Status: DC
  Administered 2022-07-05 – 2022-07-06 (×2): 324 mg
  Filled 2022-07-04 (×2): qty 4

## 2022-07-04 MED ORDER — ASPIRIN 325 MG PO TBEC: 325.0000 mg | DELAYED_RELEASE_TABLET | Freq: Every day | ORAL | Status: DC

## 2022-07-04 MED ORDER — PROTAMINE SULFATE 10 MG/ML IV SOLN
INTRAVENOUS | Status: DC | PRN
  Administered 2022-07-04: 360 mg via INTRAVENOUS

## 2022-07-04 MED ORDER — NOREPINEPHRINE 4 MG/250ML-% IV SOLN
0.0000 ug/min | INTRAVENOUS | Status: DC
  Administered 2022-07-04: 3 ug/min via INTRAVENOUS

## 2022-07-04 MED ORDER — VANCOMYCIN HCL 1000 MG IV SOLR
INTRAVENOUS | Status: DC | PRN
  Administered 2022-07-04 (×2): 2 g

## 2022-07-04 MED ORDER — BUPIVACAINE HCL (PF) 0.5 % IJ SOLN
INTRAMUSCULAR | Status: AC
  Filled 2022-07-04: qty 30

## 2022-07-04 MED ORDER — LACTATED RINGERS IV SOLN: INTRAVENOUS | Status: DC | PRN

## 2022-07-04 MED ORDER — HEPARIN SODIUM (PORCINE) 1000 UNIT/ML IJ SOLN
INTRAMUSCULAR | Status: AC
  Filled 2022-07-04: qty 1

## 2022-07-04 MED ORDER — FLUCONAZOLE IN SODIUM CHLORIDE 400-0.9 MG/200ML-% IV SOLN
400.0000 mg | Freq: Once | INTRAVENOUS | Status: AC
  Administered 2022-07-05: 400 mg via INTRAVENOUS
  Filled 2022-07-04: qty 200

## 2022-07-04 MED ORDER — SODIUM CHLORIDE 0.9% IV SOLUTION: Freq: Once | INTRAVENOUS | Status: DC

## 2022-07-04 MED ORDER — SODIUM CHLORIDE 0.45 % IV SOLN: INTRAVENOUS | Status: DC | PRN

## 2022-07-04 MED ORDER — ORAL CARE MOUTH RINSE: 15.0000 mL | OROMUCOSAL | Status: DC | PRN

## 2022-07-04 MED ORDER — MORPHINE SULFATE (PF) 2 MG/ML IV SOLN
1.0000 mg | INTRAVENOUS | Status: DC | PRN
  Administered 2022-07-04 – 2022-07-06 (×8): 2 mg via INTRAVENOUS
  Filled 2022-07-04 (×2): qty 1
  Filled 2022-07-04: qty 2
  Filled 2022-07-04 (×4): qty 1

## 2022-07-04 MED ORDER — SODIUM CHLORIDE 0.9 % IV SOLN: INTRAVENOUS | Status: DC

## 2022-07-04 MED ORDER — ASPIRIN 300 MG RE SUPP: 300.0000 mg | Freq: Every day | RECTAL | Status: DC

## 2022-07-04 MED ORDER — ACETAMINOPHEN 160 MG/5ML PO SOLN
650.0000 mg | Freq: Once | ORAL | Status: AC
  Administered 2022-07-04: 650 mg
  Filled 2022-07-04: qty 20.3

## 2022-07-04 MED ORDER — VANCOMYCIN HCL 1250 MG/250ML IV SOLN
1250.0000 mg | Freq: Two times a day (BID) | INTRAVENOUS | Status: AC
  Administered 2022-07-04 – 2022-07-06 (×4): 1250 mg via INTRAVENOUS
  Filled 2022-07-04 (×4): qty 250

## 2022-07-04 MED ORDER — PANTOPRAZOLE SODIUM 40 MG PO TBEC
40.0000 mg | DELAYED_RELEASE_TABLET | Freq: Every day | ORAL | Status: DC
  Administered 2022-07-07 – 2022-07-21 (×15): 40 mg via ORAL
  Filled 2022-07-04 (×15): qty 1

## 2022-07-04 MED ORDER — ROCURONIUM BROMIDE 10 MG/ML (PF) SYRINGE
PREFILLED_SYRINGE | INTRAVENOUS | Status: DC | PRN
  Administered 2022-07-04: 50 mg via INTRAVENOUS
  Administered 2022-07-04: 70 mg via INTRAVENOUS
  Administered 2022-07-04: 50 mg via INTRAVENOUS
  Administered 2022-07-04: 30 mg via INTRAVENOUS

## 2022-07-04 MED ORDER — BISACODYL 10 MG RE SUPP: 10.0000 mg | Freq: Every day | RECTAL | Status: DC

## 2022-07-04 MED ORDER — BISACODYL 5 MG PO TBEC
10.0000 mg | DELAYED_RELEASE_TABLET | Freq: Every day | ORAL | Status: DC
  Administered 2022-07-05 – 2022-07-21 (×15): 10 mg via ORAL
  Filled 2022-07-04 (×15): qty 2

## 2022-07-04 MED ORDER — SODIUM CHLORIDE 0.9 % IV SOLN: 250.0000 mL | INTRAVENOUS | Status: DC

## 2022-07-04 MED ORDER — CEFAZOLIN SODIUM-DEXTROSE 2-4 GM/100ML-% IV SOLN
2.0000 g | Freq: Three times a day (TID) | INTRAVENOUS | Status: AC
  Administered 2022-07-04 – 2022-07-06 (×6): 2 g via INTRAVENOUS
  Filled 2022-07-04 (×6): qty 100

## 2022-07-04 MED ORDER — SODIUM CHLORIDE 0.9% FLUSH: 10.0000 mL | INTRAVENOUS | Status: DC | PRN

## 2022-07-04 MED ORDER — HEPARIN 6000 UNIT IRRIGATION SOLUTION
Status: AC
  Filled 2022-07-04: qty 500

## 2022-07-04 MED ORDER — PHENYLEPHRINE HCL-NACL 20-0.9 MG/250ML-% IV SOLN: 0.0000 ug/min | INTRAVENOUS | Status: DC

## 2022-07-04 MED ORDER — ACETAMINOPHEN 500 MG PO TABS
1000.0000 mg | ORAL_TABLET | Freq: Four times a day (QID) | ORAL | Status: AC
  Administered 2022-07-06 – 2022-07-09 (×13): 1000 mg via ORAL
  Filled 2022-07-04 (×13): qty 2

## 2022-07-04 MED ORDER — MIDAZOLAM HCL 2 MG/2ML IJ SOLN
INTRAMUSCULAR | Status: AC
  Filled 2022-07-04: qty 2

## 2022-07-04 MED ORDER — BUPIVACAINE LIPOSOME 1.3 % IJ SUSP
INTRAMUSCULAR | Status: AC
  Filled 2022-07-04: qty 20

## 2022-07-04 MED ORDER — DEXMEDETOMIDINE HCL IN NACL 400 MCG/100ML IV SOLN
0.0000 ug/kg/h | INTRAVENOUS | Status: DC
  Administered 2022-07-04 – 2022-07-05 (×3): 0.7 ug/kg/h via INTRAVENOUS
  Administered 2022-07-05: 0.6 ug/kg/h via INTRAVENOUS
  Administered 2022-07-06: 0.7 ug/kg/h via INTRAVENOUS
  Administered 2022-07-06: 0.6 ug/kg/h via INTRAVENOUS
  Filled 2022-07-04 (×5): qty 100

## 2022-07-04 MED ORDER — HEPARIN SODIUM (PORCINE) 1000 UNIT/ML IJ SOLN
INTRAMUSCULAR | Status: AC
  Filled 2022-07-04: qty 10

## 2022-07-04 MED ORDER — TRAMADOL HCL 50 MG PO TABS
50.0000 mg | ORAL_TABLET | ORAL | Status: DC | PRN
  Administered 2022-07-06: 100 mg via ORAL
  Filled 2022-07-04: qty 2

## 2022-07-04 MED ORDER — BUPIVACAINE LIPOSOME 1.3 % IJ SUSP
INTRAMUSCULAR | Status: DC | PRN
  Administered 2022-07-04: 50 mL

## 2022-07-04 MED ORDER — ARTIFICIAL TEARS OPHTHALMIC OINT
TOPICAL_OINTMENT | OPHTHALMIC | Status: DC | PRN
  Administered 2022-07-04: 1 via OPHTHALMIC

## 2022-07-04 MED ORDER — OXYCODONE HCL 5 MG PO TABS
5.0000 mg | ORAL_TABLET | ORAL | Status: DC | PRN
  Administered 2022-07-04 – 2022-07-06 (×5): 10 mg via ORAL
  Administered 2022-07-08 – 2022-07-20 (×10): 5 mg via ORAL
  Filled 2022-07-04: qty 1
  Filled 2022-07-04: qty 2
  Filled 2022-07-04: qty 1
  Filled 2022-07-04: qty 2
  Filled 2022-07-04 (×5): qty 1
  Filled 2022-07-04: qty 2
  Filled 2022-07-04: qty 1
  Filled 2022-07-04: qty 2
  Filled 2022-07-04 (×2): qty 1
  Filled 2022-07-04: qty 2

## 2022-07-04 MED ORDER — HEMOSTATIC AGENTS (NO CHARGE) OPTIME
TOPICAL | Status: DC | PRN
  Administered 2022-07-04 (×3): 1 via TOPICAL

## 2022-07-04 MED ORDER — HEPARIN SODIUM (PORCINE) 1000 UNIT/ML IJ SOLN
INTRAMUSCULAR | Status: DC | PRN
  Administered 2022-07-04: 36000 [IU] via INTRAVENOUS
  Administered 2022-07-04: 5000 [IU] via INTRAVENOUS

## 2022-07-04 MED ORDER — 0.9 % SODIUM CHLORIDE (POUR BTL) OPTIME
TOPICAL | Status: DC | PRN
  Administered 2022-07-04: 8000 mL

## 2022-07-04 MED ORDER — LACTATED RINGERS IV SOLN: INTRAVENOUS | Status: DC

## 2022-07-04 MED ORDER — HEMOSTATIC AGENTS (NO CHARGE) OPTIME
TOPICAL | Status: DC | PRN
  Administered 2022-07-04: 1 via TOPICAL

## 2022-07-04 MED ORDER — FENTANYL CITRATE (PF) 250 MCG/5ML IJ SOLN
INTRAMUSCULAR | Status: AC
  Filled 2022-07-04: qty 5

## 2022-07-04 MED ORDER — ALBUMIN HUMAN 5 % IV SOLN: 250.0000 mL | INTRAVENOUS | Status: AC | PRN

## 2022-07-04 MED ORDER — CHLORHEXIDINE GLUCONATE 0.12 % MT SOLN
15.0000 mL | OROMUCOSAL | Status: AC
  Administered 2022-07-04: 15 mL via OROMUCOSAL
  Filled 2022-07-04: qty 15

## 2022-07-04 MED ORDER — HEPARIN 6000 UNIT IRRIGATION SOLUTION
Status: DC | PRN
  Administered 2022-07-04: 1

## 2022-07-04 MED ORDER — SODIUM CHLORIDE 0.9% FLUSH: 3.0000 mL | INTRAVENOUS | Status: DC | PRN

## 2022-07-04 MED ORDER — SODIUM CHLORIDE 0.9 % IV SOLN: INTRAVENOUS | Status: DC | PRN

## 2022-07-04 MED ORDER — ONDANSETRON HCL 4 MG/2ML IJ SOLN
INTRAMUSCULAR | Status: DC | PRN
  Administered 2022-07-04: 4 mg via INTRAVENOUS

## 2022-07-04 MED ORDER — LACTATED RINGERS IV SOLN: 500.0000 mL | Freq: Once | INTRAVENOUS | Status: DC | PRN

## 2022-07-04 MED ORDER — MIDAZOLAM HCL 2 MG/2ML IJ SOLN
2.0000 mg | INTRAMUSCULAR | Status: DC | PRN
  Administered 2022-07-05 – 2022-07-06 (×4): 2 mg via INTRAVENOUS
  Filled 2022-07-04 (×4): qty 2

## 2022-07-04 MED ORDER — INSULIN REGULAR(HUMAN) IN NACL 100-0.9 UT/100ML-% IV SOLN
INTRAVENOUS | Status: DC
  Administered 2022-07-04: 8 [IU]/h via INTRAVENOUS
  Filled 2022-07-04: qty 100

## 2022-07-04 MED ORDER — EPINEPHRINE HCL 5 MG/250ML IV SOLN IN NS
0.0000 ug/min | INTRAVENOUS | Status: DC
  Administered 2022-07-05: 6 ug/min via INTRAVENOUS
  Administered 2022-07-05: 7 ug/min via INTRAVENOUS
  Administered 2022-07-06: 5 ug/min via INTRAVENOUS
  Administered 2022-07-06: 6 ug/min via INTRAVENOUS
  Administered 2022-07-07: 4 ug/min via INTRAVENOUS
  Administered 2022-07-09: 1 ug/min via INTRAVENOUS
  Filled 2022-07-04 (×6): qty 250

## 2022-07-04 MED ORDER — MIDAZOLAM HCL (PF) 10 MG/2ML IJ SOLN
INTRAMUSCULAR | Status: AC
  Filled 2022-07-04: qty 2

## 2022-07-04 MED ORDER — ACETAMINOPHEN 160 MG/5ML PO SOLN
1000.0000 mg | Freq: Four times a day (QID) | ORAL | Status: AC
  Administered 2022-07-04 – 2022-07-06 (×7): 1000 mg
  Filled 2022-07-04 (×7): qty 40.6

## 2022-07-04 MED ORDER — SODIUM CHLORIDE (PF) 0.9 % IJ SOLN
INTRAMUSCULAR | Status: AC
  Filled 2022-07-04: qty 10

## 2022-07-04 MED ORDER — ORAL CARE MOUTH RINSE
15.0000 mL | OROMUCOSAL | Status: DC
  Administered 2022-07-04 – 2022-07-06 (×24): 15 mL via OROMUCOSAL

## 2022-07-04 MED ORDER — POTASSIUM CHLORIDE 10 MEQ/50ML IV SOLN
10.0000 meq | INTRAVENOUS | Status: AC
  Administered 2022-07-04 (×3): 10 meq via INTRAVENOUS

## 2022-07-04 MED ORDER — CLEVIDIPINE BUTYRATE 0.5 MG/ML IV EMUL
INTRAVENOUS | Status: AC
  Filled 2022-07-04: qty 50

## 2022-07-04 MED ORDER — SODIUM CHLORIDE 0.9 % IV SOLN
20.0000 ug | Freq: Once | INTRAVENOUS | Status: AC
  Administered 2022-07-04: 20 ug via INTRAVENOUS
  Filled 2022-07-04: qty 5

## 2022-07-04 MED ORDER — SODIUM CHLORIDE 0.9 % IV SOLN
600.0000 mg | Freq: Once | INTRAVENOUS | Status: AC
  Administered 2022-07-05: 600 mg via INTRAVENOUS
  Filled 2022-07-04: qty 10

## 2022-07-04 MED ORDER — VANCOMYCIN HCL IN DEXTROSE 1-5 GM/200ML-% IV SOLN: 1000.0000 mg | Freq: Two times a day (BID) | INTRAVENOUS | Status: DC

## 2022-07-04 MED ORDER — VASOPRESSIN 20 UNITS/100 ML INFUSION FOR SHOCK
0.0000 [IU]/min | INTRAVENOUS | Status: DC
  Filled 2022-07-04: qty 100

## 2022-07-04 MED ORDER — PROPOFOL 10 MG/ML IV BOLUS
INTRAVENOUS | Status: DC | PRN
  Administered 2022-07-04: 100 mg via INTRAVENOUS

## 2022-07-04 MED ORDER — SODIUM CHLORIDE 0.9% FLUSH
10.0000 mL | Freq: Two times a day (BID) | INTRAVENOUS | Status: DC
  Administered 2022-07-05: 10 mL
  Administered 2022-07-05: 30 mL
  Administered 2022-07-07: 20 mL
  Administered 2022-07-09 – 2022-07-13 (×9): 10 mL
  Administered 2022-07-14: 20 mL
  Administered 2022-07-14 – 2022-07-18 (×8): 10 mL
  Administered 2022-07-18: 20 mL
  Administered 2022-07-19 – 2022-07-21 (×4): 10 mL

## 2022-07-04 MED ORDER — FAMOTIDINE IN NACL 20-0.9 MG/50ML-% IV SOLN
20.0000 mg | Freq: Two times a day (BID) | INTRAVENOUS | Status: AC
  Administered 2022-07-04 (×2): 20 mg via INTRAVENOUS
  Filled 2022-07-04 (×2): qty 50

## 2022-07-04 MED ORDER — MAGNESIUM SULFATE 4 GM/100ML IV SOLN
4.0000 g | Freq: Once | INTRAVENOUS | Status: AC
  Administered 2022-07-04: 4 g via INTRAVENOUS
  Filled 2022-07-04: qty 100

## 2022-07-04 MED ORDER — FENTANYL CITRATE (PF) 250 MCG/5ML IJ SOLN
INTRAMUSCULAR | Status: DC | PRN
  Administered 2022-07-04 (×4): 50 ug via INTRAVENOUS
  Administered 2022-07-04: 300 ug via INTRAVENOUS

## 2022-07-04 MED ORDER — PHENYLEPHRINE 80 MCG/ML (10ML) SYRINGE FOR IV PUSH (FOR BLOOD PRESSURE SUPPORT)
PREFILLED_SYRINGE | INTRAVENOUS | Status: DC | PRN
  Administered 2022-07-04: 80 ug via INTRAVENOUS
  Administered 2022-07-04: 40 ug via INTRAVENOUS

## 2022-07-04 MED ORDER — DOCUSATE SODIUM 100 MG PO CAPS
200.0000 mg | ORAL_CAPSULE | Freq: Every day | ORAL | Status: DC
  Filled 2022-07-04: qty 2

## 2022-07-04 MED ORDER — DEXAMETHASONE SODIUM PHOSPHATE 10 MG/ML IJ SOLN
INTRAMUSCULAR | Status: DC | PRN
  Administered 2022-07-04: 10 mg via INTRAVENOUS

## 2022-07-04 MED ORDER — ALBUMIN HUMAN 5 % IV SOLN: INTRAVENOUS | Status: DC | PRN

## 2022-07-04 MED ORDER — ACETAMINOPHEN 650 MG RE SUPP: 650.0000 mg | Freq: Once | RECTAL | Status: AC

## 2022-07-04 MED ORDER — SODIUM CHLORIDE 0.9% FLUSH
3.0000 mL | Freq: Two times a day (BID) | INTRAVENOUS | Status: DC
  Administered 2022-07-05 – 2022-07-20 (×14): 3 mL via INTRAVENOUS

## 2022-07-04 MED ORDER — MIDAZOLAM HCL (PF) 5 MG/ML IJ SOLN
INTRAMUSCULAR | Status: DC | PRN
  Administered 2022-07-04: 2 mg via INTRAVENOUS
  Administered 2022-07-04: 4 mg via INTRAVENOUS
  Administered 2022-07-04 (×4): 2 mg via INTRAVENOUS

## 2022-07-04 MED ORDER — DEXTROSE 50 % IV SOLN
0.0000 mL | INTRAVENOUS | Status: DC | PRN
  Filled 2022-07-04: qty 50

## 2022-07-04 MED ORDER — ONDANSETRON HCL 4 MG/2ML IJ SOLN: 4.0000 mg | Freq: Four times a day (QID) | INTRAMUSCULAR | Status: DC | PRN

## 2022-07-04 MED ORDER — CALCIUM CHLORIDE 10 % IV SOLN
INTRAVENOUS | Status: AC
  Filled 2022-07-04: qty 20

## 2022-07-04 MED ORDER — PROPOFOL 10 MG/ML IV BOLUS
INTRAVENOUS | Status: AC
  Filled 2022-07-04: qty 20

## 2022-07-04 MED ORDER — VANCOMYCIN HCL 1000 MG IV SOLR
INTRAVENOUS | Status: AC
  Filled 2022-07-04: qty 60

## 2022-07-04 SURGICAL SUPPLY — 105 items
ADH SKN CLS APL DERMABOND .7 (GAUZE/BANDAGES/DRESSINGS) ×2
ADH SRG 12 PREFL SYR 3 SPRDR (MISCELLANEOUS) ×2
APL SRG 7X2 LUM MLBL SLNT (VASCULAR PRODUCTS)
APPLICATOR TIP COSEAL (VASCULAR PRODUCTS) IMPLANT
APPLIER CLIP 11 MED OPEN (CLIP) ×1
APPLIER CLIP 9.375 SM OPEN (CLIP) ×1
APR CLP MED 11 20 MLT OPN (CLIP) ×1
APR CLP SM 9.3 20 MLT OPN (CLIP) ×1
ATRICLIP EXCLUSION VLAA 45 (Miscellaneous) ×1 IMPLANT
BAG DECANTER FOR FLEXI CONT (MISCELLANEOUS) IMPLANT
BLADE CLIPPER SURG (BLADE) ×1 IMPLANT
BLADE STERNUM SYSTEM 6 (BLADE) IMPLANT
BOOT SUTURE AID YELLOW STND (SUTURE) IMPLANT
CANNULA AORTIC ROOT 9FR (CANNULA) IMPLANT
CANNULA EZ GLIDE AORTIC 21FR (CANNULA) IMPLANT
CANNULA MC2 2 STG 29/37 NON-V (CANNULA) IMPLANT
CANNULA MC2 TWO STAGE (CANNULA) ×1
CANNULA SUMP PERICARDIAL (CANNULA) IMPLANT
CATH DIAG EXPO 6F AL1 (CATHETERS) ×1 IMPLANT
CATH INFINITI 6F MPB2 (CATHETERS) ×1 IMPLANT
CATH THORACIC 28FR (CATHETERS) IMPLANT
CATH THORACIC 28FR RT ANG (CATHETERS) IMPLANT
CLIP APPLIE 11 MED OPEN (CLIP) IMPLANT
CLIP APPLIE 9.375 SM OPEN (CLIP) IMPLANT
CNTNR URN SCR LID CUP LEK RST (MISCELLANEOUS) IMPLANT
CONN ST 1/4X3/8  BEN (MISCELLANEOUS) ×2
CONN ST 1/4X3/8 BEN (MISCELLANEOUS) IMPLANT
CONT SPEC 4OZ STRL OR WHT (MISCELLANEOUS) ×1
CONTAINER PROTECT SURGISLUSH (MISCELLANEOUS) ×2 IMPLANT
DERMABOND ADVANCED .7 DNX12 (GAUZE/BANDAGES/DRESSINGS) IMPLANT
DEVICE EXCLUSIN ATRCLP VLAA 45 (Miscellaneous) IMPLANT
DRAIN CHANNEL 24FR (DRAIN) IMPLANT
DRAIN SUMP SARATOGA 24F (WOUND CARE) IMPLANT
DRAPE C-ARM 42X72 X-RAY (DRAPES) ×2 IMPLANT
DRAPE CV SPLIT W-CLR ANES SCRN (DRAPES) ×1 IMPLANT
DRAPE INCISE IOBAN 66X45 STRL (DRAPES) IMPLANT
DRAPE PERI GROIN 82X75IN TIB (DRAPES) ×1 IMPLANT
DRAPE SLUSH MACHINE 52X66 (DRAPES) IMPLANT
DRAPE SLUSH/WARMER DISC (DRAPES) ×1 IMPLANT
DRAPE TABLE BACK 80X90 (DRAPES) IMPLANT
DRSG AQUACEL AG ADV 3.5X14 (GAUZE/BANDAGES/DRESSINGS) IMPLANT
ELECT BLADE 4.0 EZ CLEAN MEGAD (MISCELLANEOUS) ×1
ELECT BLADE 6.5 EXT (BLADE) IMPLANT
ELECTRODE BLDE 4.0 EZ CLN MEGD (MISCELLANEOUS) IMPLANT
FELT TEFLON 1X6 (MISCELLANEOUS) IMPLANT
FELT TEFLON 6X6 (MISCELLANEOUS) IMPLANT
GAUZE 4X4 16PLY ~~LOC~~+RFID DBL (SPONGE) ×1 IMPLANT
GAUZE SPONGE 4X4 12PLY STRL (GAUZE/BANDAGES/DRESSINGS) IMPLANT
GAUZE SPONGE 4X4 12PLY STRL LF (GAUZE/BANDAGES/DRESSINGS) IMPLANT
GLOVE BIO SURGEON STRL SZ 6 (GLOVE) IMPLANT
GLOVE BIO SURGEON STRL SZ 6.5 (GLOVE) IMPLANT
GLOVE BIOGEL M STRL SZ7.5 (GLOVE) ×3 IMPLANT
GLOVE SURG SS PI 7.5 STRL IVOR (GLOVE) IMPLANT
GOWN STRL REUS W/ TWL LRG LVL3 (GOWN DISPOSABLE) ×4 IMPLANT
GOWN STRL REUS W/TWL LRG LVL3 (GOWN DISPOSABLE) ×4
HANDLE SUCTION POOLE (INSTRUMENTS) IMPLANT
HEMOSTAT SURGICEL 2X14 (HEMOSTASIS) IMPLANT
HEMOSTAT SURGICEL NU-KNIT 6X9 (HEMOSTASIS) IMPLANT
INSERT FOGARTY SM (MISCELLANEOUS) ×1 IMPLANT
INSERT FOGARTY XLG (MISCELLANEOUS) IMPLANT
INSERT SUTURE HOLDER (MISCELLANEOUS) IMPLANT
KIT BASIN OR (CUSTOM PROCEDURE TRAY) ×1 IMPLANT
KIT LVAD HEARTMATE 3 W-CNTRL (Prosthesis & Implant Heart) IMPLANT
LINE VENT (MISCELLANEOUS) IMPLANT
LOOP VESSEL MINI RED (MISCELLANEOUS) ×1 IMPLANT
MARKER PEN SURG W/LABELS BLK (STERILIZATION PRODUCTS) IMPLANT
NDL HYPO 21X1.5 ECLIPSE (NEEDLE) IMPLANT
NEEDLE HYPO 21X1.5 ECLIPSE (NEEDLE) ×1 IMPLANT
NS IRRIG 1000ML POUR BTL (IV SOLUTION) ×4 IMPLANT
PACK CHEST (CUSTOM PROCEDURE TRAY) ×1 IMPLANT
PAD ARMBOARD 7.5X6 YLW CONV (MISCELLANEOUS) ×2 IMPLANT
PAD ELECT DEFIB RADIOL ZOLL (MISCELLANEOUS) ×1 IMPLANT
PENCIL BUTTON HOLSTER BLD 10FT (ELECTRODE) IMPLANT
PUMP SET IMPELLA 5.5 US (CATHETERS) ×1 IMPLANT
PUNCH AORTIC ROTATE 4.5MM 8IN (MISCELLANEOUS) IMPLANT
SEALANT SURG COSEAL 8ML (VASCULAR PRODUCTS) ×1 IMPLANT
SET MPS 3-ND DEL (MISCELLANEOUS) IMPLANT
SPONGE T-LAP 18X18 ~~LOC~~+RFID (SPONGE) ×4 IMPLANT
SPONGE T-LAP 4X18 ~~LOC~~+RFID (SPONGE) ×1 IMPLANT
SUCTION POOLE HANDLE (INSTRUMENTS) ×1
SUT ETHILON 3 0 FSL (SUTURE) IMPLANT
SUT ETHILON 3 0 PS 1 (SUTURE) ×2 IMPLANT
SUT PROLENE 1 XLH 60 (SUTURE) IMPLANT
SUT PROLENE 3 0 SH1 36 (SUTURE) IMPLANT
SUT PROLENE 4 0 RB 1 (SUTURE) ×6
SUT PROLENE 4 0 SH DA (SUTURE) IMPLANT
SUT PROLENE 4-0 RB1 .5 CRCL 36 (SUTURE) IMPLANT
SUT SILK  1 MH (SUTURE) ×17
SUT SILK 1 MH (SUTURE) ×4 IMPLANT
SUT SILK 1 TIES 10X30 (SUTURE) ×1 IMPLANT
SUT STEEL 6MS V (SUTURE) IMPLANT
SUT STEEL STERNAL CCS#1 18IN (SUTURE) IMPLANT
SUT VIC AB 1 CTX 36 (SUTURE) ×2
SUT VIC AB 1 CTX36XBRD ANBCTR (SUTURE) IMPLANT
SYR 10ML KIT SKIN ADHESIVE (MISCELLANEOUS) IMPLANT
SYR 20ML LL LF (SYRINGE) IMPLANT
SYR BULB IRRIG 60ML STRL (SYRINGE) IMPLANT
SYSTEM SAHARA CHEST DRAIN ATS (WOUND CARE) IMPLANT
TOWEL GREEN STERILE (TOWEL DISPOSABLE) ×1 IMPLANT
TOWEL GREEN STERILE FF (TOWEL DISPOSABLE) ×1 IMPLANT
TRAY FOLEY SLVR 16FR TEMP STAT (SET/KITS/TRAYS/PACK) ×1 IMPLANT
TUBE CONNECTING 20X1/4 (TUBING) IMPLANT
TUBE SUCT INTRACARD DLP 20F (MISCELLANEOUS) IMPLANT
WATER STERILE IRR 1000ML POUR (IV SOLUTION) ×2 IMPLANT
YANKAUER SUCT BULB TIP NO VENT (SUCTIONS) IMPLANT

## 2022-07-04 NOTE — Transfer of Care (Signed)
Immediate Anesthesia Transfer of Care Note  Patient: Darryl Frye  Procedure(s) Performed: INSERTION OF IMPLANTABLE LEFT VENTRICULAR ASSIST DEVICE; ATRICLIP 45 TRANSESOPHAGEAL ECHOCARDIOGRAM (TEE)  Patient Location: SICU  Anesthesia Type:General  Level of Consciousness: sedated and Patient remains intubated per anesthesia plan  Airway & Oxygen Therapy: Patient remains intubated per anesthesia plan and Patient placed on Ventilator (see vital sign flow sheet for setting)  Post-op Assessment: Report given to RN and Post -op Vital signs reviewed and stable  Post vital signs: Reviewed and stable  Last Vitals:  Vitals Value Taken Time  BP    Temp 35.6 C 07/04/22 1415  Pulse 139 07/04/22 1415  Resp 12 07/04/22 1415  SpO2 94 % 07/04/22 1415  Vitals shown include unvalidated device data.  Last Pain:  Vitals:   07/04/22 0301  TempSrc: Oral  PainSc: 0-No pain         Complications: No notable events documented.

## 2022-07-04 NOTE — Progress Notes (Signed)
Pharmacy Antibiotic Note  Darryl Frye is a 61 y.o. male admitted on 06/27/2022 s/p LVAD placement 11/14 to receive surgical prophylaxis.  Pharmacy has been consulted for Vancomycin dosing for 48 hours post-AET.   AET 07/04/22 at 14:17 PM.  Vancomycin 1500 mg IV x1 given at 0904 AM pre-operatively.  SCr 0.90 (CrCl ~29mL/min). WBC 15.7.   Plan: Vancomycin 1250 every 12 hours x 48 hours.  Monitor renal function and clinical status.   Height: 5\' 11"  (180.3 cm) Weight: 90.3 kg (199 lb 1.2 oz) IBW/kg (Calculated) : 75.3  Temp (24hrs), Avg:97.6 F (36.4 C), Min:96.1 F (35.6 C), Max:98.4 F (36.9 C)  Recent Labs  Lab 06/27/22 1511 06/28/22 0445 07/01/22 0542 07/02/22 0505 07/03/22 0505 07/04/22 0352 07/04/22 0835 07/04/22 0958 07/04/22 1035 07/04/22 1200  WBC 6.6  --  6.3 6.3 6.8 7.5  --   --   --   --   CREATININE 1.02   < > 1.14 1.11 1.12 1.28* 1.00 1.00 0.90 0.90   < > = values in this interval not displayed.    Estimated Creatinine Clearance: 94.1 mL/min (by C-G formula based on SCr of 0.9 mg/dL).    No Known Allergies  Antimicrobials this admission: Vancomycin 11/14 >>11/16.  Dose adjustments this admission:  Microbiology results:  Thank you for allowing pharmacy to be a part of this patient's care.  Sloan Leiter, PharmD, BCPS, BCCCP Clinical Pharmacist Please refer to [facility] for [place] numbers 07/04/2022 3:09 PM

## 2022-07-04 NOTE — Anesthesia Procedure Notes (Signed)
Central Venous Catheter Insertion Performed by: Suzette Battiest, MD, anesthesiologist Start/End11/14/2023 6:55 AM, 07/04/2022 7:05 AM Patient location: Pre-op. Preanesthetic checklist: patient identified, IV checked, site marked, risks and benefits discussed, surgical consent, monitors and equipment checked, pre-op evaluation, timeout performed and anesthesia consent Hand hygiene performed  and maximum sterile barriers used  PA cath was placed.Swan type:thermodilution PA Cath depth:50 Procedure performed without using ultrasound guided technique. Attempts: 1 Patient tolerated the procedure well with no immediate complications.

## 2022-07-04 NOTE — Anesthesia Procedure Notes (Signed)
Central Venous Catheter Insertion Performed by: Suzette Battiest, MD, anesthesiologist Start/End11/14/2023 6:55 AM, 07/04/2022 7:05 AM Patient location: Pre-op. Preanesthetic checklist: patient identified, IV checked, site marked, risks and benefits discussed, surgical consent, monitors and equipment checked, pre-op evaluation, timeout performed and anesthesia consent Position: Trendelenburg Lidocaine 1% used for infiltration and patient sedated Hand hygiene performed , maximum sterile barriers used  and Seldinger technique used Catheter size: 9 Fr Total catheter length 10. Central line was placed.MAC introducer Swan type:thermodilution PA Cath depth:50 Procedure performed using ultrasound guided technique. Ultrasound Notes:anatomy identified, needle tip was noted to be adjacent to the nerve/plexus identified, no ultrasound evidence of intravascular and/or intraneural injection and image(s) printed for medical record Attempts: 1 Following insertion, line sutured, dressing applied and Biopatch. Post procedure assessment: blood return through all ports, free fluid flow and no air  Patient tolerated the procedure well with no immediate complications.

## 2022-07-04 NOTE — Progress Notes (Signed)
Pt transported from OR on the vent with Nitric. Pt remained stable throughout transport. No complications noted during transport.

## 2022-07-04 NOTE — Anesthesia Procedure Notes (Signed)
Procedure Name: Intubation Date/Time: 07/04/2022 8:30 AM  Performed by: Leonor Liv, CRNAPre-anesthesia Checklist: Patient identified, Emergency Drugs available, Suction available and Patient being monitored Patient Re-evaluated:Patient Re-evaluated prior to induction Oxygen Delivery Method: Circle System Utilized Preoxygenation: Pre-oxygenation with 100% oxygen Induction Type: IV induction Ventilation: Mask ventilation without difficulty and Oral airway inserted - appropriate to patient size Laryngoscope Size: Mac and 4 Grade View: Grade I Tube type: Oral Tube size: 8.0 mm Number of attempts: 1 Airway Equipment and Method: Stylet and Oral airway Placement Confirmation: ETT inserted through vocal cords under direct vision, positive ETCO2 and breath sounds checked- equal and bilateral Secured at: 23 cm Tube secured with: Tape Dental Injury: Teeth and Oropharynx as per pre-operative assessment

## 2022-07-04 NOTE — Op Note (Signed)
OPERATIVE NOTE: Patient Name: Darryl Frye Date of Birth: 30-May-1961 Date of Operation: 07/04/22  OPERATION: Left Ventricular Assist Device (Heartmate 3) Left Atrial Appendage Closure (81mm Atriclip)  SURGEON: Pierre Bali Shakira Los MD   ASSISTANT: Ivin Poot MD  FINDINGS: LVAD cannula excellent placement across from mitral valve  Flows up to 4.2L/min in Darryl Frye Left ventricular apical core   TUBES:  2 24 Fr blakes (bilateral pleural) 28 Fr straight and 28 Fr right angle (mediastinal)   PROCEDURE IN DETAIL: The patient was brought in the operating room and laid in supine position.  The patient was prepped and draped in standard fashion.  An arterial, pulmonary arterial and venous lines were placed by anesthesia along with a single-lumen endotracheal tube. Local analgesia given to the sternum. Sternotomy was performed and the pericardium was opened.  Left pericardium was taken down.  I tunneled out the driveline to three finger breadths below the right costal margin. I then tunneled out the VAD as well and put the device in a vancomycin soaked lap pad. Full dose heparin was given to achieve an ACT of 480 and aortic and venous cannulas were inserted.The patient was placed on cardiopulmonary bypass. Heart lifted and 30mm Atriclip placed on left atrial appendage. Next, eight laps put under the heart and apex elevated. Position checked by echo to be across from mitral valve. Bioglue and felt used affix sewing ring to left ventricle. This was done with running Ethibond in quadrants. One layer of felt had been added to under the sewing ring as well. An apical core was down with core device. Near circumferential muscle was then resected inside the ventricle. The Heartmate 3 was inserted. It was locked in place. Vent attached to the outflow graft. VAD and heart de-aired. Flow down and partial crossclamp on the aorta. Flow back up. Linear aortotomy and with 4-0 prolene the outflow graft sewed  onto the aorta. Next, root vent was placed and on. Bypass was weaned and the VAD speed increased from 3000, to 3400, then by 200 increments to 5000. Around there were were flowing 4.2 liters. Bypass had been weaned and cannulas were removed, then protamine was given and hemostasis was achieved. Chest tubes were placed. No pacing wires placed.  The chest was then closed in interrupted steel wire and the presternal layers were closed in 3 layers of absorbable suture. I secured the driveline with three number one prolene sutures and one 3-0 Nylon. These can but cut around 1 month.  The patient had a stable status and was transferred to the postoperative care unit. We checked the chest tubes output in the OR for a half hour to ensure would not be excessive. All surgical counts were correct.

## 2022-07-04 NOTE — Consult Note (Signed)
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 Presented 11/14 for LVAD after failing out-pt medical therapies.   Initial flow 4.5 l/min speed 5100 power 3.6 P1 2.3  Pertinent  Medical History  Afib/flutter s/p ablation May 2023 NICM EF 20-25% has ICD Remote h/o heavy ETOH and cocaine Class IV NYHF sxs.   Significant Hospital Events: Including procedures, antibiotic start and stop dates in addition to other pertinent events   11/14 LVAD placed. Returned to ICU full vent support. On iNO 20ppm, Returned to ICU post-op intubated on Neo, epi 6, and amio gtt. Initial hemodynamics PA 34/20 CO 4.9 CI 2.4 co ox 70% l CVP 10-. On full vent support. PCCM asked to assist w/ care  Interim History / Subjective:  Sedated on vent   Objective   Blood pressure (Abnormal) 84/72, pulse (Abnormal) 145, temperature (Abnormal) 96.1 F (35.6 C), resp. rate 12, height 5\' 11"  (1.803 m), weight 90.3 kg, SpO2 95 %. PAP: (45-64)/(29-40) 45/40 CVP:  [3 mmHg-4 mmHg] 4 mmHg  Vent Mode: SIMV;PRVC;PSV FiO2 (%):  [50 %] 50 % Set Rate:  [12 bmp] 12 bmp Vt Set:  [600 mL] 600 mL PEEP:  [5 cmH20] 5 cmH20 Pressure Support:  [10 cmH20] 10 cmH20 Plateau Pressure:  [15 cmH20] 15 cmH20   Intake/Output Summary (Last 24 hours) at 07/04/2022 1525 Last data filed at 07/04/2022 1500 Gross per 24 hour  Intake 5860.63 ml  Output 5363 ml  Net 497.63 ml   Filed Weights   07/02/22 0340 07/03/22 0500 07/04/22 0456  Weight: 90.7 kg 89.9 kg 90.3 kg    Examination: General: 61 year old male sedated on full vent support  HENT: NCAT orally intubated Lungs: clear  Cardiovascular: currently on LVAD. Dressings intact. 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  Need to avoid both hypoxia and hypercarbia  VAP bundle PAD protocol RASS goal -1  H/o afib/flutter s/p prior ablation -previously on DOAC Plan Tele Amiodarone  Holding ac defer timing to resume per surg     Best Practice (right click and "Reselect all SmartList Selections" daily)   Diet/type: NPO DVT prophylaxis: SCD GI prophylaxis: H2B Lines: Central line Foley:  Yes, and it is still needed Code Status:  limited Last date of multidisciplinary goals of care discussion [NA]  Labs   CBC: Recent Labs  Lab 07/01/22 0542 07/02/22 0505 07/03/22 0505 07/04/22 0352 07/04/22 4944  07/04/22 1115 07/04/22 1200 07/04/22 1203 07/04/22 1340 07/04/22 1424  WBC 6.3 6.3 6.8 7.5  --   --   --   --   --  15.7*  HGB 14.7 15.1 15.5 15.2   < > 9.7 11.2* 11.2* 11.1* 11.1*  HCT 46.1 44.8 47.1 45.3   < > 29.8* 33.0* 33.0* 33.1* 34.5*  MCV 87.0 85.3 85.5 85.3  --   --   --   --   --  87.8  PLT 305 291 322 321  --  224  --   --  189 213  220   < > = values in this interval not displayed.    Basic Metabolic Panel: Recent Labs  Lab 06/27/22 1825 06/28/22 0445 06/29/22 0410 06/29/22 9417 06/30/22 0510 07/01/22 0542 07/02/22 0505 07/03/22 0505 07/04/22 0352  07/04/22 0835 07/04/22 0841 07/04/22 0958 07/04/22 1006 07/04/22 1011 07/04/22 1035 07/04/22 1114 07/04/22 1200 07/04/22 1203  NA  --    < > 131*   < > 134* 136 136 133* 136 138   < > 135   < > 135 133* 134* 137 136  K  --    < > 3.7   < > 3.7 4.1 4.1 3.9 4.0 4.1   < > 4.5   < > 4.0 4.5 4.6 3.5 3.4*  CL  --    < > 94*  --  101 104 102 101 102 101  --  101  --   --  99  --  99  --   CO2  --    < > 25  --  25 24 24 22 24   --   --   --   --   --   --   --   --   --   GLUCOSE  --    < > 157*  --  123* 99 105* 209* 133* 151*  --  193*  --   --  162*  --  159*  --   BUN  --    < > 29*  --  23* 20 17 18  25* 21*  --  21*  --   --  19  --  19  --   CREATININE  --    < > 1.50*  --  1.20 1.14 1.11 1.12 1.28* 1.00  --  1.00  --   --  0.90  --  0.90  --   CALCIUM  --    < > 8.5*  --  8.4* 8.3* 8.8* 8.3* 9.1  --   --   --   --   --   --   --   --   --   MG 2.0  --  2.5*  --   --   --   --   --   --   --   --   --   --   --   --   --   --   --    < > = values in this interval not displayed.   GFR: Estimated Creatinine Clearance: 94.1 mL/min (by C-G formula based on SCr of 0.9 mg/dL). Recent Labs  Lab 07/02/22 0505 07/03/22 0505 07/04/22 0352 07/04/22 1424  WBC 6.3 6.8 7.5 15.7*    Liver Function Tests: No results for input(s): "AST", "ALT", "ALKPHOS", "BILITOT", "PROT", "ALBUMIN" in the last 168 hours. No results for input(s): "LIPASE", "AMYLASE" in the last 168 hours. No results  for input(s): "AMMONIA" in the last 168 hours.  ABG    Component Value Date/Time   PHART 7.315 (L) 07/04/2022 1203   PCO2ART 43.9 07/04/2022 1203   PO2ART 154 (H) 07/04/2022 1203   HCO3 22.3 07/04/2022 1203   TCO2 24 07/04/2022 1203   ACIDBASEDEF 4.0 (H) 07/04/2022 1203   O2SAT 69.7 07/04/2022 1443     Coagulation Profile: Recent Labs  Lab 06/27/22 1825 07/04/22 1340 07/04/22 1424  INR 1.0 1.3* 1.3*  NOT CALCULATED    Cardiac Enzymes: No results for input(s): "CKTOTAL", "CKMB", "CKMBINDEX",  "TROPONINI" in the last 168 hours.  HbA1C: Hgb A1c MFr Bld  Date/Time Value Ref Range Status  06/27/2022 03:11 PM 9.3 (H) 4.8 - 5.6 % Final    Comment:    (NOTE) Pre diabetes:          5.7%-6.4%  Diabetes:              >6.4%  Glycemic control for   <7.0% adults with diabetes   10/15/2021 05:00 AM 9.7 (H) 4.8 - 5.6 % Final    Comment:    (NOTE) Pre diabetes:          5.7%-6.4%  Diabetes:              >6.4%  Glycemic control for   <7.0% adults with diabetes     CBG: Recent Labs  Lab 07/03/22 1105 07/03/22 1555 07/03/22 2119 07/04/22 0544 07/04/22 1418  GLUCAP 132* 151* 231* 154* 162*    Review of Systems:   Not able   Past Medical History:  He,  has a past medical history of Acute kidney injury ([place]), Acute respiratory failure ([place]), Atrial flutter ([place]), CHF (congestive heart failure) ([place]), Coronary artery disease, Diabetes mellitus without complication ([place]), DVT (deep venous thrombosis) ([place]) (06/2019), Heart murmur, Hypertension, Presence of permanent cardiac pacemaker, and Renal disorder.   Surgical History:   Past Surgical History:  Procedure Laterality Date   A-FLUTTER ABLATION N/A 01/12/2022   Procedure: A-FLUTTER ABLATION;  Surgeon: Evans Lance, MD;  Location: [facility];  Service: Cardiovascular;  Laterality: N/A;   COLONOSCOPY     Dr. Posey Pronto; One 4-6 mm hyperplastic rectal polyp, mid left-sided diverticulosis.   CYST REMOVAL NECK     HERNIA REPAIR     RIGHT HEART CATH N/A 06/27/2022   Procedure: RIGHT HEART CATH;  Surgeon: Larey Dresser, MD;  Location: [facility];  Service: Cardiovascular;  Laterality: N/A;   RIGHT HEART CATH N/A 06/29/2022   Procedure: RIGHT HEART CATH;  Surgeon: Larey Dresser, MD;  Location: [facility];  Service: Cardiovascular;  Laterality: N/A;   TONSILLECTOMY       Social History:   reports that he quit smoking about 2 years ago. His smoking use included cigarettes. He has a 20.00 pack-year  smoking history. He has never used smokeless tobacco. He reports that he does not currently use alcohol. He reports that he does not currently use drugs after having used the following drugs: "Crack" cocaine.   Family History:  His family history includes Diabetes in his mother; Heart disease in his maternal grandmother and mother; Hyperlipidemia in his mother; Hypertension in his mother; Prostate cancer in his maternal grandfather. There is no history of Colon cancer or Colon polyps.   Allergies No Known Allergies   Home Medications  Prior to Admission medications   Medication Sig Start Date End Date Taking? Authorizing Provider  apixaban (ELIQUIS) 5 MG  TABS tablet Take 5 mg by mouth 2 (two) times daily. 05/23/20  Yes [provider]  atorvastatin (LIPITOR) 20 MG tablet Take 20 mg by mouth daily. 02/22/22  Yes [provider]  furosemide (LASIX) 20 MG tablet Take 2 tablets (40 mg total) by mouth daily. Patient taking differently: Take 40-60 mg by mouth See admin instructions. Taking 3 tabs 60 mg in the AM and 2 tabs ( 40 mg) in the afternoon 06/06/22  Yes Larey Dresser, MD  insulin glargine (LANTUS) 100 UNIT/ML Solostar Pen Inject 40 Units into the skin daily. 04/10/21  Yes [provider]  JARDIANCE 25 MG TABS tablet Take 25 mg by mouth daily. 05/18/22  Yes [provider]  levothyroxine (SYNTHROID) 25 MCG tablet Take 25 mcg by mouth daily before breakfast.   Yes [provider]  magnesium oxide (MAG-OX) 400 MG tablet Take 400 mg by mouth 2 (two) times daily.   Yes [provider]  metFORMIN (GLUCOPHAGE) 1000 MG tablet Take 1,000 mg by mouth 2 (two) times daily with a meal. 04/10/21 06/23/23 Yes [provider]  metoprolol succinate (TOPROL-XL) 25 MG 24 hr tablet Take 0.5 tablets (12.5 mg total) by mouth daily. 06/22/22 09/03/24 Yes Clegg, Amy D, NP  sacubitril-valsartan (ENTRESTO) 24-26 MG Take 1 tablet by mouth 2 (two) times daily.  12/21/21  Yes BranchAlphonse Guild, MD  sertraline (ZOLOFT) 50 MG tablet Take 50 mg by mouth daily.   Yes [provider]  spironolactone (ALDACTONE) 25 MG tablet Take 1 tablet (25 mg total) by mouth daily. 12/21/21  Yes BranchAlphonse Guild, MD     Critical care time: 32 min    Erick Colace ACNP-BC [place] Pager # [phone] OR # [phone] if no answer

## 2022-07-04 NOTE — Progress Notes (Signed)
TCTS Progress Note:  POD 0 LVAD  Doing well  Nitric wean as ordered.      Latest Ref Rng & Units 07/04/2022    8:03 PM 07/04/2022    5:16 PM 07/04/2022    4:16 PM  CBC  Hemoglobin 13.0 - 17.0 g/dL 11.2  10.9  11.6   Hematocrit 39.0 - 52.0 % 33.0  32.0  34.0        Latest Ref Rng & Units 07/04/2022    8:03 PM 07/04/2022    5:16 PM 07/04/2022    4:16 PM  CMP  Sodium 135 - 145 mmol/L 137  137  138   Potassium 3.5 - 5.1 mmol/L 4.2  4.1  3.8     ABG    Component Value Date/Time   PHART 7.305 (L) 07/04/2022 2003   PCO2ART 38.9 07/04/2022 2003   PO2ART 91 07/04/2022 2003   HCO3 19.4 (L) 07/04/2022 2003   TCO2 21 (L) 07/04/2022 2003   ACIDBASEDEF 6.0 (H) 07/04/2022 2003   O2SAT 96 07/04/2022 2003    Vent Mode: SIMV;PRVC;PSV FiO2 (%):  [50 %] 50 % Set Rate:  [12 bmp-20 bmp] 20 bmp Vt Set:  [600 mL] 600 mL PEEP:  [5 cmH20] 5 cmH20 Pressure Support:  [10 cmH20] 10 cmH20 Plateau Pressure:  [15 cmH20-22 cmH20] 22 cmH20

## 2022-07-04 NOTE — Progress Notes (Signed)
  Echocardiogram Echocardiogram Transesophageal has been performed.  Darryl Frye 07/04/2022, 9:34 AM

## 2022-07-04 NOTE — Anesthesia Procedure Notes (Signed)
Arterial Line Insertion Start/End11/14/2023 6:45 AM, 07/04/2022 7:05 AM Performed by: Josephine Igo, CRNA, CRNA  Patient location: Pre-op. Preanesthetic checklist: patient identified, IV checked, site marked, risks and benefits discussed, surgical consent, monitors and equipment checked, pre-op evaluation, timeout performed and anesthesia consent Patient sedated Left, radial was placed Catheter size: 20 G Hand hygiene performed  and maximum sterile barriers used  Allen's test indicative of satisfactory collateral circulation Attempts: 1 Procedure performed without using ultrasound guided technique. Following insertion, Biopatch and dressing applied. Patient tolerated the procedure well with no immediate complications.

## 2022-07-04 NOTE — Progress Notes (Signed)
EKG CRITICAL VALUE     12 lead EKG performed.  Critical value noted.  K[place], RN notified.   Merlyn Lot, CCT 07/04/2022 2:25 PM

## 2022-07-04 NOTE — Interval H&P Note (Signed)
History and Physical Interval Note:  07/04/2022 7:09 AM  Darryl Frye  has presented today for surgery, with the diagnosis of ICM.  The various methods of treatment have been discussed with the patient and family. After consideration of risks, benefits and other options for treatment, the patient has consented to  Procedure(s): INSERTION OF IMPLANTABLE LEFT VENTRICULAR ASSIST DEVICE (N/A) TRANSESOPHAGEAL ECHOCARDIOGRAM (TEE) (N/A) as a surgical intervention.  The patient's history has been reviewed, patient examined, no change in status, stable for surgery.  I have reviewed the patient's chart and labs.  Questions were answered to the patient's satisfaction.     Pierre Bali Dakotah Heiman

## 2022-07-04 NOTE — Anesthesia Preprocedure Evaluation (Signed)
Anesthesia Evaluation  Patient identified by MRN, date of birth, ID band Patient awake    Reviewed: Allergy & Precautions, NPO status , Patient's Chart, lab work & pertinent test results  Airway Mallampati: I  TM Distance: >3 FB Neck ROM: Full    Dental  (+) Dental Advisory Given, Edentulous Upper, Edentulous Lower   Pulmonary former smoker   breath sounds clear to auscultation       Cardiovascular hypertension, Pt. on medications and Pt. on home beta blockers + CAD, +CHF and + DOE  + pacemaker + Cardiac Defibrillator + Valvular Problems/Murmurs  Rhythm:Regular Rate:Normal     Neuro/Psych negative neurological ROS     GI/Hepatic negative GI ROS,,,(+)     substance abuse    Endo/Other  diabetes, Type 2, Oral Hypoglycemic Agents    Renal/GU CRFRenal disease     Musculoskeletal   Abdominal   Peds  Hematology negative hematology ROS (+)   Anesthesia Other Findings   Reproductive/Obstetrics                             Anesthesia Physical Anesthesia Plan  ASA: 4  Anesthesia Plan: General   Post-op Pain Management:    Induction: Intravenous  PONV Risk Score and Plan: 2 and Dexamethasone, Ondansetron and Treatment may vary due to age or medical condition  Airway Management Planned: Oral ETT  Additional Equipment: Arterial line, CVP, PA Cath, TEE and Ultrasound Guidance Line Placement  Intra-op Plan:   Post-operative Plan: Post-operative intubation/ventilation  Informed Consent: I have reviewed the patients History and Physical, chart, labs and discussed the procedure including the risks, benefits and alternatives for the proposed anesthesia with the patient or authorized representative who has indicated his/her understanding and acceptance.     Dental advisory given  Plan Discussed with: CRNA  Anesthesia Plan Comments:        Anesthesia Quick Evaluation

## 2022-07-04 NOTE — Progress Notes (Signed)
CSW contacted patient's ex wife and primary caregiver [name] by phone. She states she was received calls throughout the day with updates form the OR. She was very appreciative of the communication and denies any concerns at this time. She plans to visit bedside tomorrow. CSW will continue to follow for supportive needs throughout implant hospitalization. Raquel Sarna, [place], [place]

## 2022-07-04 NOTE — H&P (Signed)
[address] Lake[address]       [place] [address]             [phone]                                        Rollie E Ropp [place] Medical Record #[id number] Date of Birth: 15-Apr-1961   Referring: No ref. provider found Primary Care: [name], FNP Primary Cardiologist:Branch, Roderic Palau, MD   Chief Complaint:   No chief complaint on file.     History of Present Illness:     Expand All Collapse All      Advanced Heart Failure Team History and Physical Note   PCP:  [name], FNP  PCP-Cardiology: Carlyle Dolly, MD      Reason for Admission:      HPI:     Mr Woolstenhulme is a 61 y.o. with history of atrial fibrillation/flutter and nonischemic cardiomyopathy was initially referred by Dr. Aundra Dubin for evaluation of surgical advanced heart failure therapies.     Patient was diagnosed with CHF in 2020 at [facility] in [place].  At the time, he was using (drank heavily and used cocaine).  He stopped all substances then and hasn't relapsed.    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    Echo in 11/20 showed EF < 10%, cath in 12/20 showed normal coronaries.  Echo has shown persistently low EF over time, echo in 2/23 showed EF 20-25%.  He has a Medtronic ICD.      Patient was admitted in 2/23 with atrial flutter/RVR. He was started on amiodarone and converted to NSR.  He had atrial flutter ablation in 5/23 and amiodarone was stopped   He was seen in the ER last month twice as well - 06/01/22  and 10/22. NYHA class IV symptoms.  He was set up for [place].     Saw Dr Aundra Dubin 06/06/22. Lasix was increased to 40 mg daily. CPX and CMRI ordered   He reports several weeks of progressive orthopnea, was unable to sleep last night.  Has been sitting up in a chair.  He has been short of breath walking around his house.  Taking Lasix 60 mg daily, says he has been urinating but weight has been going up.     He doesn't have much  in the way of noncardiac issues. No prior chest surgery. Creatinine is mildly elevated. Right heart function is near normal.    RHC: RHC Procedural Findings: Hemodynamics (mmHg) RA mean 14 RV 49/24 PA 66/53, mean 54 PCWP mean 43 Oxygen saturations: PA 54% AO 93% Cardiac Output (Fick) 3.4  Cardiac Index (Fick) 1.62 PVR 3.2 WU Cardiac Output (Thermo) 2.75 Cardiac Index (Thermo) 1.31  PAPI 0.93             Past Medical History:  Diagnosis Date   Acute kidney injury ([place])     Acute respiratory failure (HCC)     Atrial flutter (HCC)      on Eliquis   CHF (congestive heart failure) ([place])      EF 20-25% 10/15/21, AICD in place.   Coronary artery disease     Diabetes mellitus without complication ([place])     DVT (deep venous thrombosis) ([place]) 06/2019   Heart murmur     Hypertension  Presence of permanent cardiac pacemaker     Renal disorder             Past Surgical History:  Procedure Laterality Date   A-FLUTTER ABLATION N/A 01/12/2022    Procedure: A-FLUTTER ABLATION;  Surgeon: Evans Lance, MD;  Location: [facility];  Service: Cardiovascular;  Laterality: N/A;   COLONOSCOPY        Dr. Posey Pronto; One 4-6 mm hyperplastic rectal polyp, mid left-sided diverticulosis.   CYST REMOVAL NECK       HERNIA REPAIR       RIGHT HEART CATH N/A 06/27/2022    Procedure: RIGHT HEART CATH;  Surgeon: Larey Dresser, MD;  Location: [facility];  Service: Cardiovascular;  Laterality: N/A;   TONSILLECTOMY          Social History        Tobacco Use  Smoking Status Former   Packs/day: 1.00   Years: 20.00   Total pack years: 20.00   Types: Cigarettes   Quit date: 07/07/2019   Years since quitting: 2.9  Smokeless Tobacco Never    Social History       Substance and Sexual Activity  Alcohol Use Not Currently        No Known Allergies            Current Facility-Administered Medications  Medication Dose Route Frequency Provider Last Rate Last Admin    acetaminophen (TYLENOL) tablet 650 mg  650 mg Oral Q4H PRN Larey Dresser, MD       acetaminophen (TYLENOL) tablet 650 mg  650 mg Oral Q4H PRN Larey Dresser, MD       amiodarone (NEXTERONE PREMIX) 360-4.14 MG/200ML-% (1.8 mg/mL) IV infusion  30 mg/hr Intravenous Continuous Larey Dresser, MD 16.67 mL/hr at 06/28/22 0623 30 mg/hr at 06/28/22 1093   apixaban (ELIQUIS) tablet 5 mg  5 mg Oral BID Larey Dresser, MD   5 mg at 06/27/22 2149   atorvastatin (LIPITOR) tablet 20 mg  20 mg Oral Daily Larey Dresser, MD       Chlorhexidine Gluconate Cloth 2 % PADS 6 each  6 each Topical Daily Larey Dresser, MD   6 each at 06/27/22 1830   Chlorhexidine Gluconate Cloth 2 % PADS 6 each  6 each Topical Q0600 Larey Dresser, MD   6 each at 06/28/22 0624   digoxin (LANOXIN) tablet 0.125 mg  0.125 mg Oral Daily Larey Dresser, MD   0.125 mg at 06/27/22 1644   empagliflozin (JARDIANCE) tablet 25 mg  25 mg Oral Daily Larey Dresser, MD   25 mg at 06/27/22 1644   furosemide (LASIX) 200 mg in dextrose 5 % 100 mL (2 mg/mL) infusion  12 mg/hr Intravenous Continuous Larey Dresser, MD 6 mL/hr at 06/28/22 0622 12 mg/hr at 06/28/22 0622   insulin aspart (novoLOG) injection 0-15 Units  0-15 Units Subcutaneous TID WC Larey Dresser, MD   3 Units at 06/28/22 2355   insulin aspart (novoLOG) injection 0-5 Units  0-5 Units Subcutaneous QHS Larey Dresser, MD   3 Units at 06/27/22 2152   insulin glargine-yfgn (SEMGLEE) injection 30 Units  30 Units Subcutaneous Daily Larey Dresser, MD   30 Units at 06/27/22 1643   levothyroxine (SYNTHROID) tablet 25 mcg  25 mcg Oral Q0600 Larey Dresser, MD   25 mcg at 06/28/22 0619   magnesium oxide (MAG-OX) tablet 400 mg  400 mg Oral  BID Larey Dresser, MD   400 mg at 06/27/22 2149   milrinone (PRIMACOR) 20 MG/100 ML (0.2 mg/mL) infusion  0.375 mcg/kg/min Intravenous Continuous Larey Dresser, MD 6.77 mL/hr at 06/28/22 0610 0.25 mcg/kg/min at 06/28/22 0610    mupirocin ointment (BACTROBAN) 2 % 1 Application  1 Application Nasal BID Larey Dresser, MD   1 Application at 29/93/71 2158   ondansetron (ZOFRAN) injection 4 mg  4 mg Intravenous Q6H PRN Larey Dresser, MD       potassium chloride SA (KLOR-CON M) CR tablet 40 mEq  40 mEq Oral Once Larey Dresser, MD       sacubitril-valsartan (ENTRESTO) 24-26 mg per tablet  1 tablet Oral BID Larey Dresser, MD   1 tablet at 06/27/22 2149   sertraline (ZOLOFT) tablet 50 mg  50 mg Oral Daily Larey Dresser, MD       sodium chloride flush (NS) 0.9 % injection 10-40 mL  10-40 mL Intracatheter Q12H Larey Dresser, MD       sodium chloride flush (NS) 0.9 % injection 10-40 mL  10-40 mL Intracatheter PRN Larey Dresser, MD       spironolactone (ALDACTONE) tablet 25 mg  25 mg Oral Daily Larey Dresser, MD                 Medications Prior to Admission  Medication Sig Dispense Refill Last Dose   apixaban (ELIQUIS) 5 MG TABS tablet Take 5 mg by mouth 2 (two) times daily.     06/26/2022 at 1800   atorvastatin (LIPITOR) 20 MG tablet Take 20 mg by mouth daily.     06/27/2022 at 0700   furosemide (LASIX) 20 MG tablet Take 2 tablets (40 mg total) by mouth daily. (Patient taking differently: Take 40-60 mg by mouth See admin instructions. Taking 3 tabs 60 mg in the AM and 2 tabs ( 40 mg) in the afternoon) 180 tablet 3 06/26/2022   insulin glargine (LANTUS) 100 UNIT/ML Solostar Pen Inject 40 Units into the skin daily.     06/26/2022   JARDIANCE 25 MG TABS tablet Take 25 mg by mouth daily.     06/26/2022   levothyroxine (SYNTHROID) 25 MCG tablet Take 25 mcg by mouth daily before breakfast.     06/27/2022 at 0700   magnesium oxide (MAG-OX) 400 MG tablet Take 400 mg by mouth 2 (two) times daily.     06/27/2022   metFORMIN (GLUCOPHAGE) 1000 MG tablet Take 1,000 mg by mouth 2 (two) times daily with a meal.     06/26/2022   metoprolol succinate (TOPROL-XL) 25 MG 24 hr tablet Take 0.5 tablets (12.5 mg total) by mouth daily. 15  tablet 5 06/27/2022 at 0700   sacubitril-valsartan (ENTRESTO) 24-26 MG Take 1 tablet by mouth 2 (two) times daily. 180 tablet 2 06/26/2022   sertraline (ZOLOFT) 50 MG tablet Take 50 mg by mouth daily.     06/27/2022   spironolactone (ALDACTONE) 25 MG tablet Take 1 tablet (25 mg total) by mouth daily. 90 tablet 2 06/27/2022 at 0700           Family History  Problem Relation Age of Onset   Hyperlipidemia Mother     Heart disease Mother          has PPM   Diabetes Mother     Hypertension Mother     Heart disease Maternal Grandmother     Prostate cancer Maternal Grandfather  Colon cancer Neg Hx     Colon polyps Neg Hx          Review of Systems:    ROS14 pt reviewed and negative except as per HPI   Physical Exam: BP 101/60 (BP Location: Left Arm)   Pulse 71   Temp 97.8 F (36.6 C) (Oral)   Resp 20   Ht 5\' 11"  (1.803 m)   Wt 88.5 kg   SpO2 96%   BMI 27.21 kg/m      NAD Resp nonlaboured No chest incision, + ICD incision Abd soft ntnd, looks to have excess fluid Neurologically grossly intact Extremities WWP - no sig edema     Diagnostic Studies & Laboratory data:     Recent Radiology Findings:    Imaging Results (Last 48 hours)  [place] EKG SITE RITE   Result Date: 06/27/2022 If Site Rite image not attached, placement could not be confirmed due to current cardiac rhythm.   CARDIAC CATHETERIZATION   Result Date: 06/27/2022 1. Markedly elevated PCWP 2. Primarily pulmonary venous hypertension. 3. Elevated RA pressure. 4. Low cardiac output 5. Low PAPI suggesting RV dysfunction. I will admit the patient.       I have independently reviewed the above radiologic studies and discussed with the patient    Recent Lab Findings: Recent Labs       Lab Results  Component Value Date    WBC 6.6 06/27/2022    HGB 15.4 06/27/2022    HCT 47.5 06/27/2022    PLT 275 06/27/2022    GLUCOSE 175 (H) 06/28/2022    CHOL 212 (H) 06/27/2022    TRIG 134 06/27/2022    HDL 55 06/27/2022     LDLCALC 130 (H) 06/27/2022    ALT 19 06/27/2022    AST 20 06/27/2022    NA 136 06/28/2022    K 3.9 06/28/2022    CL 98 06/28/2022    CREATININE 1.23 06/28/2022    BUN 20 06/28/2022    CO2 27 06/28/2022    TSH 2.576 06/27/2022    INR 1.0 06/27/2022    HGBA1C 9.3 (H) 06/27/2022            Assessment / Plan:     Mr Churilla is a 61 y.o. with history of atrial fibrillation/flutter and nonischemic cardiomyopathy was initially referred by Dr. Aundra Dubin for evaluation of surgical advanced heart failure therapies.  He is motivated and wants to get back to work.      Good candidate for LVAD.  He has had a lot of atrial flutter in the past. I may clip his LAA at the time of surgery as well.    By Feb 23 Echo - LVIDD 5.8cm, LVIDS 5.4cm Right heart function near normal No prior chest surgery Cr reasonable at 1.2   Justice Rocher MD  CV Surgery

## 2022-07-04 NOTE — Progress Notes (Addendum)
Advanced Heart Failure VAD Team Note  PCP-Cardiologist: Carlyle Dolly, MD   Subjective:   - 11/13 S/P HMIII L atrial appendage closure.   Intubated/sedated.   Currently on Neo 30, Epi 6, milrinone. 0.3 mcg, and amio 30 mg per hour.    PA 34/20  CO 4.9  CI 2.4 Co-ox 70%  LVAD INTERROGATION:  HeartMate III LVAD:   Flow 4.5 liters/min, speed 5100, power 3.6, PI 2.3 .    Objective:    Vital Signs:   Temp:  [96.1 F (35.6 C)-98.4 F (36.9 C)] 96.1 F (35.6 C) (11/14 1425) Pulse Rate:  [65-145] 145 (11/14 1425) Resp:  [12-23] 12 (11/14 1425) BP: (84-132)/(72-91) 84/72 (11/14 1408) SpO2:  [92 %-99 %] 95 % (11/14 1425) Arterial Line BP: (73)/(68) 73/68 (11/14 1425) FiO2 (%):  [50 %] 50 % (11/14 1408) Weight:  [90.3 kg] 90.3 kg (11/14 0456) Last BM Date : 07/01/22 Mean arterial Pressure 70  Intake/Output:   Intake/Output Summary (Last 24 hours) at 07/04/2022 1435 Last data filed at 07/04/2022 1422 Gross per 24 hour  Intake 4899.85 ml  Output 4813 ml  Net 86.85 ml     Physical Exam    General:  Intubated/sedated HEENT: ETT Neck: supple. JVP difficult to assess. Carotids 2+ bilat; no bruits. No lymphadenopathy or thyromegaly appreciated. Cor: Mechanical heart sounds with LVAD hum present. MT x2  Lungs: clear. Chest tubes x2 Abdomen: soft, nontender, nondistended. No hepatosplenomegaly. No bruits or masses. Good bowel sounds. Driveline: C/D/I; securement device intact and driveline incorporated Extremities: no cyanosis, clubbing, rash, edema Neuro: Intubated  Telemetry   SR   EKG    N/A  Labs   Basic Metabolic Panel: Recent Labs  Lab 06/27/22 1825 06/28/22 0445 06/29/22 0410 06/29/22 6195 06/30/22 0510 07/01/22 0542 07/02/22 0505 07/03/22 0505 07/04/22 0932 07/04/22 6712 07/04/22 0841 07/04/22 0958 07/04/22 1006 07/04/22 1011 07/04/22 1035 07/04/22 1114 07/04/22 1200 07/04/22 1203  NA  --    < > 131*   < > 134* 136 136 133* 136 138    < > 135   < > 135 133* 134* 137 136  K  --    < > 3.7   < > 3.7 4.1 4.1 3.9 4.0 4.1   < > 4.5   < > 4.0 4.5 4.6 3.5 3.4*  CL  --    < > 94*  --  101 104 102 101 102 101  --  101  --   --  99  --  99  --   CO2  --    < > 25  --  25 24 24 22 24   --   --   --   --   --   --   --   --   --   GLUCOSE  --    < > 157*  --  123* 99 105* 209* 133* 151*  --  193*  --   --  162*  --  159*  --   BUN  --    < > 29*  --  23* 20 17 18  25* 21*  --  21*  --   --  19  --  19  --   CREATININE  --    < > 1.50*  --  1.20 1.14 1.11 1.12 1.28* 1.00  --  1.00  --   --  0.90  --  0.90  --   CALCIUM  --    < >  8.5*  --  8.4* 8.3* 8.8* 8.3* 9.1  --   --   --   --   --   --   --   --   --   MG 2.0  --  2.5*  --   --   --   --   --   --   --   --   --   --   --   --   --   --   --    < > = values in this interval not displayed.    Liver Function Tests: Recent Labs  Lab 06/27/22 1511  AST 20  ALT 19  ALKPHOS 117  BILITOT 0.7  PROT 7.1  ALBUMIN 3.8   No results for input(s): "LIPASE", "AMYLASE" in the last 168 hours. No results for input(s): "AMMONIA" in the last 168 hours.  CBC: Recent Labs  Lab 06/27/22 1511 06/29/22 0812 07/01/22 0542 07/02/22 0505 07/03/22 0505 07/04/22 0352 07/04/22 0835 07/04/22 1114 07/04/22 1115 07/04/22 1200 07/04/22 1203 07/04/22 1340  WBC 6.6  --  6.3 6.3 6.8 7.5  --   --   --   --   --   --   HGB 15.4   < > 14.7 15.1 15.5 15.2   < > 10.5* 9.7 11.2* 11.2* 11.1*  HCT 47.5   < > 46.1 44.8 47.1 45.3   < > 31.0* 29.8* 33.0* 33.0* 33.1*  MCV 87.0  --  87.0 85.3 85.5 85.3  --   --   --   --   --   --   PLT 275  --  305 291 322 321  --   --  224  --   --  189   < > = values in this interval not displayed.    INR: Recent Labs  Lab 06/27/22 1825 07/04/22 1340  INR 1.0 1.3*    Other results: EKG:    Imaging   No results found.   Medications:     Scheduled Medications:  sodium chloride   Intravenous Once   [START ON 07/05/2022] acetaminophen  1,000 mg Oral Q6H    Or   [START ON 07/05/2022] acetaminophen (TYLENOL) oral liquid 160 mg/5 mL  1,000 mg Per Tube Q6H   acetaminophen (TYLENOL) oral liquid 160 mg/5 mL  650 mg Per Tube Once   Or   acetaminophen  650 mg Rectal Once   [START ON 07/05/2022] aspirin EC  325 mg Oral Daily   Or   [START ON 07/05/2022] aspirin  324 mg Per Tube Daily   Or   [START ON 07/05/2022] aspirin  300 mg Rectal Daily   atorvastatin  20 mg Oral Daily   [START ON 07/05/2022] bisacodyl  10 mg Oral Daily   Or   [START ON 07/05/2022] bisacodyl  10 mg Rectal Daily   chlorhexidine  15 mL Mouth/Throat NOW   Chlorhexidine Gluconate Cloth  6 each Topical Daily   digoxin  0.125 mg Oral Daily   [START ON 07/05/2022] docusate sodium  200 mg Oral Daily   empagliflozin  25 mg Oral Daily   feeding supplement (GLUCERNA SHAKE)  237 mL Oral QAC lunch   insulin aspart  0-15 Units Subcutaneous TID WC   insulin aspart  0-5 Units Subcutaneous QHS   insulin glargine-yfgn  30 Units Subcutaneous Daily   levothyroxine  25 mcg Oral Q0600   magnesium oxide  400 mg Oral BID   multivitamin with  minerals  1 tablet Oral Daily   [START ON 07/06/2022] pantoprazole  40 mg Oral Daily   sertraline  50 mg Oral Daily   sodium chloride flush  10-40 mL Intracatheter Q12H   sodium chloride flush  3 mL Intravenous Q12H   [START ON 07/05/2022] sodium chloride flush  3 mL Intravenous Q12H    Infusions:  sodium chloride     [START ON 07/05/2022] sodium chloride     sodium chloride     albumin human     amiodarone 30 mg/hr (07/04/22 0748)    ceFAZolin (ANCEF) IV     dexmedetomidine (PRECEDEX) IV infusion     epinephrine     famotidine (PEPCID) IV     [START ON 07/05/2022] fluconazole (DIFLUCAN) IV     heparin 1,700 Units/hr (07/04/22 0136)   insulin     lactated ringers     lactated ringers     lactated ringers     magnesium sulfate     milrinone 0.3 mcg/kg/min (07/04/22 1129)   norepinephrine (LEVOPHED) Adult infusion     potassium chloride      [START ON 07/05/2022] rifampin (RIFADIN) 600 mg in sodium chloride 0.9 % 100 mL IVPB     vancomycin      PRN Medications: sodium chloride, acetaminophen, albumin human, dextrose, lactated ringers, midazolam, morphine injection, ondansetron (ZOFRAN) IV, ondansetron (ZOFRAN) IV, mouth rinse, oxyCODONE, sodium chloride flush, [START ON 07/05/2022] sodium chloride flush, traMADol   Patient Profile   Admitted with A/C HFrEF --> cardiogenic shock. S/P HMIII LVAD  Assessment/Plan:   1. S/P HMIII LVAD Intubated on iNO. PA 34/20 CO 4.9 CI 2.3  - Current on Neo 30, Epi 6, Milrinone 0.3 mcg. Will switch from neo to norepi to support BP - Currently receiving albumin  - VAD parameters reviewed.   2. A/C HFrEF--> Cardiogenic Shock  : Nonischemic cardiomyopathy, diagnosed 2020.  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  Cath in 12/20 with no significant coronary disease.  Medtronic ICD. Most recent echo in 2/23 showed EF 20-25% with normal RV.  Recently, patient has been symptomatically worse, NYHA class IV with profound orthopnea. RHC showed markedly elevated filling pressures, primarily pulmonary venous hypertension, low cardiac output, and low PAPI.  Patient was admitted for inotrope and diuresis. Echo this admission with EF <20%, mild RV dysfunction.  He was started on milrinone and gradually increased 0.5 prior to implant. - As above on pressors   3. Atrial fibrillation/flutter: S/p flutter ablation.  He mainly had atrial flutter.   -In SR. Anticoagulation on hold.  - Continue amiodarone gtt while on pressors   4. . H/o DVT: on Hep gtt, previously on apixaban   I reviewed the LVAD parameters from today, and compared the results to the patient's prior recorded data.  No programming changes were made.  The LVAD is  functioning within specified parameters.  The patient performs LVAD self-test daily.  LVAD interrogation was negative for any significant power changes, alarms or PI events/speed drops.  LVAD equipment check completed and is in good working order.  Back-up equipment present.   LVAD education done on emergency procedures and precautions and reviewed exit site care.  Length of Stay: [place], NP 07/04/2022, 2:35 PM  VAD Team --- VAD ISSUES ONLY--- Pager [phone] (7am -  7am)  Advanced Heart Failure Team  Pager [phone] (M-F; 7a - 5p)  Please contact [facility] for night-coverage after hours (5p -7a ) and weekends on [email or web address]  Patient seen with NP, agree with the above note.   He is back in unit s/p HM3 LVAD.  Good flow at 5100 rpm.  Good cardiac output by thermodilution and co-ox.  He is on epinephrine 6, phenylephrine 20, milrinone 0.3, NO 20.   In NSR on amiodarone gtt 30 mg/hr.   General: Intubated  HEENT: Normal. Neck: Supple, JVP 7-8 cm. Carotids OK.  Cardiac:  Mechanical heart sounds with LVAD hum present.  Lungs:  CTAB, normal effort.  Abdomen:  NT, ND, no HSM. No bruits or masses. +BS  LVAD exit site: Well-healed and incorporated. Dressing dry and intact. No erythema or drainage. Stabilization device present and accurately applied. Driveline dressing changed daily per sterile technique. Extremities:  Warm and dry. No cyanosis, clubbing, rash, or edema.  Neuro: Sedated on vent.   Stable post-op.  I will wean off phenylephrine and onto norepinephrine if further pressor needed.  Continue current epinephrine, milrinone, and NO.    Monitor CBC closely.   Hopefully will progress to extubation soon.   CRITICAL CARE Performed by: Loralie Champagne  Total critical care time: 35 minutes  Critical care time was exclusive of separately billable procedures and treating other patients.  Critical care was necessary to treat or prevent imminent or life-threatening  deterioration.  Critical care was time spent personally by me on the following activities: development of treatment plan with patient and/or surrogate as well as nursing, discussions with consultants, evaluation of patient's response to treatment, examination of patient, obtaining history from patient or surrogate, ordering and performing treatments and interventions, ordering and review of laboratory studies, ordering and review of radiographic studies, pulse oximetry and re-evaluation of patient's condition.  Loralie Champagne 07/04/2022 3:24 PM

## 2022-07-04 NOTE — Plan of Care (Signed)

## 2022-07-05 ENCOUNTER — Encounter (HOSPITAL_COMMUNITY): Payer: Self-pay | Admitting: Cardiothoracic Surgery

## 2022-07-05 ENCOUNTER — Inpatient Hospital Stay (HOSPITAL_COMMUNITY): Payer: Medicare Other

## 2022-07-05 DIAGNOSIS — I255 Ischemic cardiomyopathy: Secondary | ICD-10-CM | POA: Diagnosis not present

## 2022-07-05 DIAGNOSIS — R570 Cardiogenic shock: Secondary | ICD-10-CM | POA: Diagnosis not present

## 2022-07-05 LAB — COOXEMETRY PANEL
Carboxyhemoglobin: 2 % — ABNORMAL HIGH (ref 0.5–1.5)
Carboxyhemoglobin: 1.8 % — ABNORMAL HIGH (ref 0.5–1.5)
Methemoglobin: <0.7 % (ref 0.0–1.5)
Methemoglobin: <0.7 % (ref 0.0–1.5)
O2 Saturation: 52.8 %
O2 Saturation: 54.2 %
Total hemoglobin: 10.9 g/dL — ABNORMAL LOW (ref 12.0–16.0)
Total hemoglobin: 10.7 g/dL — ABNORMAL LOW (ref 12.0–16.0)

## 2022-07-05 LAB — GLUCOSE, CAPILLARY
Glucose-Capillary: 117 mg/dL — ABNORMAL HIGH (ref 70–99)
Glucose-Capillary: 119 mg/dL — ABNORMAL HIGH (ref 70–99)
Glucose-Capillary: 121 mg/dL — ABNORMAL HIGH (ref 70–99)
Glucose-Capillary: 121 mg/dL — ABNORMAL HIGH (ref 70–99)
Glucose-Capillary: 123 mg/dL — ABNORMAL HIGH (ref 70–99)
Glucose-Capillary: 129 mg/dL — ABNORMAL HIGH (ref 70–99)
Glucose-Capillary: 164 mg/dL — ABNORMAL HIGH (ref 70–99)
Glucose-Capillary: 174 mg/dL — ABNORMAL HIGH (ref 70–99)
Glucose-Capillary: 195 mg/dL — ABNORMAL HIGH (ref 70–99)
Glucose-Capillary: 213 mg/dL — ABNORMAL HIGH (ref 70–99)
Glucose-Capillary: 95 mg/dL (ref 70–99)
Glucose-Capillary: 98 mg/dL (ref 70–99)

## 2022-07-05 LAB — POCT I-STAT 7, (LYTES, BLD GAS, ICA,H+H)
Acid-base deficit: 4 mmol/L — ABNORMAL HIGH (ref 0.0–2.0)
Acid-base deficit: 5 mmol/L — ABNORMAL HIGH (ref 0.0–2.0)
Bicarbonate: 20.2 mmol/L (ref 20.0–28.0)
Bicarbonate: 19.4 mmol/L — ABNORMAL LOW (ref 20.0–28.0)
Calcium, Ion: 1.11 mmol/L — ABNORMAL LOW (ref 1.15–1.40)
Calcium, Ion: 1.09 mmol/L — ABNORMAL LOW (ref 1.15–1.40)
HCT: 33 % — ABNORMAL LOW (ref 39.0–52.0)
HCT: 35 % — ABNORMAL LOW (ref 39.0–52.0)
Hemoglobin: 11.2 g/dL — ABNORMAL LOW (ref 13.0–17.0)
Hemoglobin: 11.9 g/dL — ABNORMAL LOW (ref 13.0–17.0)
O2 Saturation: 98 %
O2 Saturation: 94 %
Patient temperature: 37.4
Patient temperature: 37.2
Potassium: 5.2 mmol/L — ABNORMAL HIGH (ref 3.5–5.1)
Potassium: 4.5 mmol/L (ref 3.5–5.1)
Sodium: 135 mmol/L (ref 135–145)
Sodium: 137 mmol/L (ref 135–145)
TCO2: 21 mmol/L — ABNORMAL LOW (ref 22–32)
TCO2: 20 mmol/L — ABNORMAL LOW (ref 22–32)
pCO2 arterial: 35.1 mmHg (ref 32–48)
pCO2 arterial: 32.7 mmHg (ref 32–48)
pH, Arterial: 7.368 (ref 7.35–7.45)
pH, Arterial: 7.383 (ref 7.35–7.45)
pO2, Arterial: 110 mmHg — ABNORMAL HIGH (ref 83–108)
pO2, Arterial: 74 mmHg — ABNORMAL LOW (ref 83–108)

## 2022-07-05 LAB — PREPARE FRESH FROZEN PLASMA
Unit division: 0
Unit division: 0
Unit division: 0

## 2022-07-05 LAB — LACTATE DEHYDROGENASE: LDH: 326 U/L — ABNORMAL HIGH (ref 98–192)

## 2022-07-05 LAB — COMPREHENSIVE METABOLIC PANEL
ALT: 25 U/L (ref 0–44)
AST: 90 U/L — ABNORMAL HIGH (ref 15–41)
Albumin: 3.6 g/dL (ref 3.5–5.0)
Alkaline Phosphatase: 70 U/L (ref 38–126)
Anion gap: 8 (ref 5–15)
BUN: 18 mg/dL (ref 6–20)
CO2: 21 mmol/L — ABNORMAL LOW (ref 22–32)
Calcium: 8 mg/dL — ABNORMAL LOW (ref 8.9–10.3)
Chloride: 107 mmol/L (ref 98–111)
Creatinine, Ser: 1.23 mg/dL (ref 0.61–1.24)
GFR, Estimated: >60 mL/min (ref >60)
Glucose, Bld: 93 mg/dL (ref 70–99)
Potassium: 5.3 mmol/L — ABNORMAL HIGH (ref 3.5–5.1)
Sodium: 136 mmol/L (ref 135–145)
Total Bilirubin: 0.8 mg/dL (ref 0.3–1.2)
Total Protein: 6.5 g/dL (ref 6.5–8.1)

## 2022-07-05 LAB — BASIC METABOLIC PANEL
Anion gap: 12 (ref 5–15)
BUN: 20 mg/dL (ref 6–20)
CO2: 18 mmol/L — ABNORMAL LOW (ref 22–32)
Calcium: 8.2 mg/dL — ABNORMAL LOW (ref 8.9–10.3)
Chloride: 105 mmol/L (ref 98–111)
Creatinine, Ser: 1.44 mg/dL — ABNORMAL HIGH (ref 0.61–1.24)
GFR, Estimated: 56 mL/min — ABNORMAL LOW (ref >60)
Glucose, Bld: 188 mg/dL — ABNORMAL HIGH (ref 70–99)
Potassium: 5.1 mmol/L (ref 3.5–5.1)
Sodium: 135 mmol/L (ref 135–145)

## 2022-07-05 LAB — BPAM FFP
Blood Product Expiration Date: 202311152359
Blood Product Expiration Date: 202311152359
Blood Product Expiration Date: 202311152359
Blood Product Expiration Date: 202311152359
Blood Product Expiration Date: 202311152359
Blood Product Expiration Date: 202311152359
ISSUE DATE / TIME: 202311140658
ISSUE DATE / TIME: 202311140658
ISSUE DATE / TIME: 202311140658
ISSUE DATE / TIME: 202311140658
ISSUE DATE / TIME: 202311141350
ISSUE DATE / TIME: 202311141350
Unit Type and Rh: 5100
Unit Type and Rh: 5100
Unit Type and Rh: 6200
Unit Type and Rh: 6200
Unit Type and Rh: 600
Unit Type and Rh: 6200

## 2022-07-05 LAB — CBC
HCT: 32.1 % — ABNORMAL LOW (ref 39.0–52.0)
Hemoglobin: 10.6 g/dL — ABNORMAL LOW (ref 13.0–17.0)
MCH: 28 pg (ref 26.0–34.0)
MCHC: 33 g/dL (ref 30.0–36.0)
MCV: 84.9 fL (ref 80.0–100.0)
Platelets: 209 10*3/uL (ref 150–400)
RBC: 3.78 MIL/uL — ABNORMAL LOW (ref 4.22–5.81)
RDW: 15.6 % — ABNORMAL HIGH (ref 11.5–15.5)
WBC: 16.5 10*3/uL — ABNORMAL HIGH (ref 4.0–10.5)
nRBC: 0 % (ref 0.0–0.2)

## 2022-07-05 LAB — HEPARIN LEVEL (UNFRACTIONATED)
Heparin Unfractionated: <0.1 IU/mL — ABNORMAL LOW (ref 0.30–0.70)
Heparin Unfractionated: <0.1 IU/mL — ABNORMAL LOW (ref 0.30–0.70)

## 2022-07-05 LAB — CBC WITH DIFFERENTIAL/PLATELET
Abs Immature Granulocytes: 0.07 10*3/uL (ref 0.00–0.07)
Basophils Absolute: 0 10*3/uL (ref 0.0–0.1)
Basophils Relative: 0 %
Eosinophils Absolute: 0 10*3/uL (ref 0.0–0.5)
Eosinophils Relative: 0 %
HCT: 33.1 % — ABNORMAL LOW (ref 39.0–52.0)
Hemoglobin: 10.8 g/dL — ABNORMAL LOW (ref 13.0–17.0)
Immature Granulocytes: 1 %
Lymphocytes Relative: 4 %
Lymphs Abs: 0.5 10*3/uL — ABNORMAL LOW (ref 0.7–4.0)
MCH: 28.6 pg (ref 26.0–34.0)
MCHC: 32.6 g/dL (ref 30.0–36.0)
MCV: 87.6 fL (ref 80.0–100.0)
Monocytes Absolute: 1.5 10*3/uL — ABNORMAL HIGH (ref 0.1–1.0)
Monocytes Relative: 12 %
Neutro Abs: 10.6 10*3/uL — ABNORMAL HIGH (ref 1.7–7.7)
Neutrophils Relative %: 83 %
Platelets: 213 10*3/uL (ref 150–400)
RBC: 3.78 MIL/uL — ABNORMAL LOW (ref 4.22–5.81)
RDW: 15.3 % (ref 11.5–15.5)
WBC: 12.6 10*3/uL — ABNORMAL HIGH (ref 4.0–10.5)
nRBC: 0 % (ref 0.0–0.2)

## 2022-07-05 LAB — BRAIN NATRIURETIC PEPTIDE: B Natriuretic Peptide: 512 pg/mL — ABNORMAL HIGH (ref 0.0–100.0)

## 2022-07-05 LAB — SURGICAL PATHOLOGY

## 2022-07-05 LAB — PROTIME-INR
INR: 1.1 (ref 0.8–1.2)
Prothrombin Time: 14.1 seconds (ref 11.4–15.2)

## 2022-07-05 LAB — MAGNESIUM
Magnesium: 2.7 mg/dL — ABNORMAL HIGH (ref 1.7–2.4)
Magnesium: 2.7 mg/dL — ABNORMAL HIGH (ref 1.7–2.4)

## 2022-07-05 LAB — PHOSPHORUS: Phosphorus: 2.7 mg/dL (ref 2.5–4.6)

## 2022-07-05 MED ORDER — INSULIN ASPART 100 UNIT/ML IJ SOLN: 0.0000 [IU] | INTRAMUSCULAR | Status: DC

## 2022-07-05 MED ORDER — DOCUSATE SODIUM 50 MG/5ML PO LIQD
200.0000 mg | Freq: Every day | ORAL | Status: DC
  Administered 2022-07-05 – 2022-07-06 (×2): 200 mg
  Filled 2022-07-05 (×2): qty 20

## 2022-07-05 MED ORDER — HEPARIN (PORCINE) 25000 UT/250ML-% IV SOLN
1150.0000 [IU]/h | INTRAVENOUS | Status: DC
  Administered 2022-07-05: 500 [IU]/h via INTRAVENOUS
  Administered 2022-07-07: 800 [IU]/h via INTRAVENOUS
  Administered 2022-07-08: 1050 [IU]/h via INTRAVENOUS
  Filled 2022-07-05 (×3): qty 250

## 2022-07-05 MED ORDER — INSULIN ASPART 100 UNIT/ML IJ SOLN
0.0000 [IU] | INTRAMUSCULAR | Status: DC
  Administered 2022-07-05 (×3): 4 [IU] via SUBCUTANEOUS
  Administered 2022-07-05: 8 [IU] via SUBCUTANEOUS
  Administered 2022-07-06 (×2): 2 [IU] via SUBCUTANEOUS
  Administered 2022-07-06: 4 [IU] via SUBCUTANEOUS
  Administered 2022-07-06 (×3): 2 [IU] via SUBCUTANEOUS
  Administered 2022-07-07: 8 [IU] via SUBCUTANEOUS
  Administered 2022-07-07: 2 [IU] via SUBCUTANEOUS
  Administered 2022-07-07: 8 [IU] via SUBCUTANEOUS
  Administered 2022-07-07: 12 [IU] via SUBCUTANEOUS
  Administered 2022-07-07: 4 [IU] via SUBCUTANEOUS
  Administered 2022-07-08: 2 [IU] via SUBCUTANEOUS
  Administered 2022-07-08: 8 [IU] via SUBCUTANEOUS
  Administered 2022-07-08: 2 [IU] via SUBCUTANEOUS
  Administered 2022-07-08: 4 [IU] via SUBCUTANEOUS
  Administered 2022-07-08: 2 [IU] via SUBCUTANEOUS
  Administered 2022-07-08: 12 [IU] via SUBCUTANEOUS
  Administered 2022-07-09 (×2): 4 [IU] via SUBCUTANEOUS
  Administered 2022-07-09: 8 [IU] via SUBCUTANEOUS
  Administered 2022-07-09: 20 [IU] via SUBCUTANEOUS
  Administered 2022-07-10: 2 [IU] via SUBCUTANEOUS
  Administered 2022-07-10: 4 [IU] via SUBCUTANEOUS
  Administered 2022-07-10: 8 [IU] via SUBCUTANEOUS
  Administered 2022-07-10 (×2): 12 [IU] via SUBCUTANEOUS
  Administered 2022-07-10: 8 [IU] via SUBCUTANEOUS
  Administered 2022-07-11: 2 [IU] via SUBCUTANEOUS
  Administered 2022-07-11 (×2): 4 [IU] via SUBCUTANEOUS
  Administered 2022-07-11 (×2): 12 [IU] via SUBCUTANEOUS
  Administered 2022-07-12: 16 [IU] via SUBCUTANEOUS
  Administered 2022-07-12: 4 [IU] via SUBCUTANEOUS
  Administered 2022-07-12 (×3): 8 [IU] via SUBCUTANEOUS
  Administered 2022-07-12: 12 [IU] via SUBCUTANEOUS
  Administered 2022-07-12 – 2022-07-13 (×2): 8 [IU] via SUBCUTANEOUS
  Administered 2022-07-13: 4 [IU] via SUBCUTANEOUS
  Administered 2022-07-13: 8 [IU] via SUBCUTANEOUS
  Administered 2022-07-13: 4 [IU] via SUBCUTANEOUS
  Administered 2022-07-14: 8 [IU] via SUBCUTANEOUS
  Administered 2022-07-14: 12 [IU] via SUBCUTANEOUS

## 2022-07-05 MED ORDER — FUROSEMIDE 10 MG/ML IJ SOLN
40.0000 mg | Freq: Once | INTRAMUSCULAR | Status: AC
  Administered 2022-07-05: 40 mg via INTRAVENOUS
  Filled 2022-07-05: qty 4

## 2022-07-05 MED ORDER — SILDENAFIL CITRATE 20 MG PO TABS
20.0000 mg | ORAL_TABLET | Freq: Three times a day (TID) | ORAL | Status: DC
  Administered 2022-07-05 – 2022-07-21 (×50): 20 mg via ORAL
  Filled 2022-07-05 (×51): qty 1

## 2022-07-05 NOTE — Progress Notes (Signed)
This chaplain attempted F/U spiritual care as requested by the Pt. The chaplain viewed the Pt. sleeping comfortably from the door and will revisit at a time the Pt. is awake.  Chaplain Sallyanne Kuster [phone]

## 2022-07-05 NOTE — Progress Notes (Signed)
PT Cancellation Note  Patient Details Name: YECHIEL ERNY MRN: [id number] DOB: February 24, 1961   Cancelled Treatment:    Reason Eval/Treat Not Completed: Patient not medically ready; orders for PT Evaluation to start POD#2 (07/06/22). Will follow up tomorrow.  Mabeline Caras, PT, DPT Acute Rehabilitation Services  Personal: [facility] Office: [place] 07/05/2022, 8:07 AM

## 2022-07-05 NOTE — Progress Notes (Signed)
CSW visited bedside and patient intubated with no family at bedside at the time of visit. CSW contacted patient's ex wife Darryl Frye to offer support and will follow up with her at bedside on Friday when she visits again. CSW will continue to visit. Raquel Sarna, [place], [place]

## 2022-07-05 NOTE — Progress Notes (Signed)
ANTICOAGULATION CONSULT NOTE - Initial Consult  Pharmacy Consult for heparin Indication:  LVAD  No Known Allergies  Patient Measurements: Height: 5\' 11"  (180.3 cm) Weight: 97.2 kg (214 lb 4.6 oz) (with system controller) IBW/kg (Calculated) : 75.3 Heparin Dosing Weight: 95 kg  Vital Signs: Temp: 98.8 F (37.1 C) (11/15 0828) Temp Source: Core (11/15 0800) BP: 101/85 (11/15 0828) Pulse Rate: 82 (11/15 0828)  Labs: Recent Labs    07/03/22 0622 07/04/22 0352 07/04/22 0835 07/04/22 1340 07/04/22 1419 07/04/22 1424 07/04/22 1616 07/04/22 1955 07/04/22 2003 07/05/22 0401 07/05/22 0411  HGB  --  15.2   < > 11.1*   < > 11.1*   < > 10.5* 11.2* 10.8* 11.2*  HCT  --  45.3   < > 33.1*   < > 34.5*   < > 33.3* 33.0* 33.1* 33.0*  PLT  --  321   < > 189  --  213  220  --  224  --  213  --   APTT  --   --   --  31  --  31  DUPLICATE  --   --   --   --   --   LABPROT  --   --   --  15.8*  --  01.0*  DUPLICATE  --   --   --  14.1  --   INR  --   --   --  1.3*  --  1.3*  NOT CALCULATED  --   --   --  1.1  --   HEPARINUNFRC 0.43 0.45  --   --   --   --   --   --   --  <0.10*  --   CREATININE  --  1.28*   < >  --   --  1.13  --  1.26*  --  1.23  --    < > = values in this interval not displayed.    Estimated Creatinine Clearance: 76.9 mL/min (by C-G formula based on SCr of 1.23 mg/dL).   Medical History: Past Medical History:  Diagnosis Date   Acute kidney injury ([place])    Acute respiratory failure (HCC)    Atrial flutter (HCC)    on Eliquis   CHF (congestive heart failure) ([place])    EF 20-25% 10/15/21, AICD in place.   Coronary artery disease    Diabetes mellitus without complication (HCC)    DVT (deep venous thrombosis) ([place]) 06/2019   Heart murmur    Hypertension    Presence of permanent cardiac pacemaker    Renal disorder     Medications:  Scheduled:   sodium chloride   Intravenous Once   acetaminophen  1,000 mg Oral Q6H   Or   acetaminophen (TYLENOL) oral liquid  160 mg/5 mL  1,000 mg Per Tube Q6H   aspirin EC  325 mg Oral Daily   Or   aspirin  324 mg Per Tube Daily   Or   aspirin  300 mg Rectal Daily   atorvastatin  20 mg Oral Daily   bisacodyl  10 mg Oral Daily   Or   bisacodyl  10 mg Rectal Daily   Chlorhexidine Gluconate Cloth  6 each Topical Daily   digoxin  0.125 mg Oral Daily   docusate sodium  200 mg Oral Daily   empagliflozin  25 mg Oral Daily   feeding supplement (GLUCERNA SHAKE)  237 mL Oral QAC lunch   insulin glargine-yfgn  30 Units Subcutaneous Daily   levothyroxine  25 mcg Oral Q0600   magnesium oxide  400 mg Oral BID   multivitamin with minerals  1 tablet Oral Daily   mouth rinse  15 mL Mouth Rinse Q2H   [START ON 07/06/2022] pantoprazole  40 mg Oral Daily   sertraline  50 mg Oral Daily   sildenafil  20 mg Oral TID   sodium chloride flush  10-40 mL Intracatheter Q12H   sodium chloride flush  10-40 mL Intracatheter Q12H   sodium chloride flush  3 mL Intravenous Q12H   sodium chloride flush  3 mL Intravenous Q12H    Assessment: 31 yom admitted with HF now s/p HM3 LVAD with LAA clip on 11/14. On apixaban PTA for hx Aflutter (LD 11/6 PTA, now 11/9) - HL this morning came back undetectable so will not impact levels moving forward.   Hgb 11.2, plt 213. LDH 326. No s/sx of bleeding.  Goal of Therapy:  Heparin level <0.3 units/ml - fixed rate Monitor platelets by anticoagulation protocol: Yes   Plan:  Start heparin infusion at 500 units/hr on 11/15@1400  (fixed with no titration) Order heparin level 6 hr after start  Monitor heparin level, CBC, and for s/sx of bleeding   Antonietta Jewel, PharmD, [place] Clinical Pharmacist  Phone: [phone] 07/05/2022 9:15 AM  Please check AMION for all [facility] phone numbers After 10:00 PM, call [place] [phone]

## 2022-07-05 NOTE — Progress Notes (Signed)
iNO weaned from 5ppm to 4ppm. CVP 15, LVAD flow 4.0, unchanged by wean.

## 2022-07-05 NOTE — Progress Notes (Signed)
ANTICOAGULATION CONSULT NOTE   Pharmacy Consult for heparin Indication:  LVAD  No Known Allergies  Patient Measurements: Height: 5\' 11"  (180.3 cm) Weight: 97.2 kg (214 lb 4.6 oz) (with system controller) IBW/kg (Calculated) : 75.3 Heparin Dosing Weight: 95 kg  Vital Signs: Temp: 99.5 F (37.5 C) (11/15 2115) Temp Source: Core (Comment) (11/15 2000) BP: 100/72 (11/15 2000) Pulse Rate: 160 (11/15 2115)  Labs: Recent Labs    07/04/22 0352 07/04/22 0835 07/04/22 1340 07/04/22 1419 07/04/22 1424 07/04/22 1616 07/04/22 1955 07/04/22 2003 07/05/22 0401 07/05/22 0411 07/05/22 1110 07/05/22 1738 07/05/22 1946  HGB 15.2   < > 11.1*   < > 11.1*   < > 10.5*   < > 10.8* 11.2* 11.9* 10.6*  --   HCT 45.3   < > 33.1*   < > 34.5*   < > 33.3*   < > 33.1* 33.0* 35.0* 32.1*  --   PLT 321   < > 189  --  213  220  --  224  --  213  --   --  209  --   APTT  --   --  31  --  31  DUPLICATE  --   --   --   --   --   --   --   --   LABPROT  --   --  15.8*  --  29.4*  DUPLICATE  --   --   --  14.1  --   --   --   --   INR  --   --  1.3*  --  1.3*  NOT CALCULATED  --   --   --  1.1  --   --   --   --   HEPARINUNFRC 0.45  --   --   --   --   --   --   --  <0.10*  --   --   --  <0.10*  CREATININE 1.28*   < >  --   --  1.13  --  1.26*  --  1.23  --   --  1.44*  --    < > = values in this interval not displayed.     Estimated Creatinine Clearance: 65.7 mL/min (A) (by C-G formula based on SCr of 1.44 mg/dL (H)).   Medical History: Past Medical History:  Diagnosis Date   Acute kidney injury ([place])    Acute respiratory failure (HCC)    Atrial flutter (HCC)    on Eliquis   CHF (congestive heart failure) ([place])    EF 20-25% 10/15/21, AICD in place.   Coronary artery disease    Diabetes mellitus without complication (HCC)    DVT (deep venous thrombosis) ([place]) 06/2019   Heart murmur    Hypertension    Presence of permanent cardiac pacemaker    Renal disorder     Medications:   Scheduled:   sodium chloride   Intravenous Once   acetaminophen  1,000 mg Oral Q6H   Or   acetaminophen (TYLENOL) oral liquid 160 mg/5 mL  1,000 mg Per Tube Q6H   aspirin EC  325 mg Oral Daily   Or   aspirin  324 mg Per Tube Daily   Or   aspirin  300 mg Rectal Daily   atorvastatin  20 mg Oral Daily   bisacodyl  10 mg Oral Daily   Or   bisacodyl  10 mg Rectal Daily   Chlorhexidine  Gluconate Cloth  6 each Topical Daily   digoxin  0.125 mg Oral Daily   docusate  200 mg Per Tube Daily   empagliflozin  25 mg Oral Daily   feeding supplement (GLUCERNA SHAKE)  237 mL Oral QAC lunch   insulin aspart  0-24 Units Subcutaneous Q4H   insulin glargine-yfgn  30 Units Subcutaneous Daily   levothyroxine  25 mcg Oral Q0600   magnesium oxide  400 mg Oral BID   multivitamin with minerals  1 tablet Oral Daily   mouth rinse  15 mL Mouth Rinse Q2H   [START ON 07/06/2022] pantoprazole  40 mg Oral Daily   sertraline  50 mg Oral Daily   sildenafil  20 mg Oral TID   sodium chloride flush  10-40 mL Intracatheter Q12H   sodium chloride flush  10-40 mL Intracatheter Q12H   sodium chloride flush  3 mL Intravenous Q12H   sodium chloride flush  3 mL Intravenous Q12H    Assessment: 8 yom admitted with HF now s/p HM3 LVAD with LAA clip on 11/14. On apixaban PTA for hx Aflutter (LD 11/6 PTA, now 11/9) - HL this morning came back undetectable so will not impact levels moving forward.   Hgb 10.6, plt 209. LDH 326. No s/sx of bleeding.  Goal of Therapy:  Heparin level <0.3 units/ml - fixed rate Monitor platelets by anticoagulation protocol: Yes   Plan:  Continue heparin infusion at 500 units/hr (fixed with no titration) Monitor daily heparin level, CBC, and for s/sx of bleeding   Marguerite Olea, [facility] Clinical Pharmacist  07/05/2022 9:36 PM   [facility] pharmacy phone numbers are listed on [email or web address]

## 2022-07-05 NOTE — Progress Notes (Signed)
iNO weaned from 4 to 3ppm. CVP 20, LVAD flow 4.0, unchanged by wean.

## 2022-07-05 NOTE — Progress Notes (Signed)
iNO weaned from 15ppm to 10ppm. CVP 18, LVAD flow 4.1. Unchanged after wean.

## 2022-07-05 NOTE — Plan of Care (Signed)
  Problem: Cardiovascular: Goal: Ability to achieve and maintain adequate cardiovascular perfusion will improve Outcome: Progressing Goal: Vascular access site(s) Level 0-1 will be maintained Outcome: Progressing   Problem: Metabolic: Goal: Ability to maintain appropriate glucose levels will improve Outcome: Progressing   Problem: Skin Integrity: Goal: Risk for impaired skin integrity will decrease Outcome: Progressing   Problem: Tissue Perfusion: Goal: Adequacy of tissue perfusion will improve Outcome: Progressing   Problem: Coping: Goal: Level of anxiety will decrease Outcome: Progressing   Problem: Elimination: Goal: Will not experience complications related to bowel motility Outcome: Progressing Goal: Will not experience complications related to urinary retention Outcome: Progressing   Problem: Pain Managment: Goal: General experience of comfort will improve Outcome: Progressing   Problem: Safety: Goal: Ability to remain free from injury will improve Outcome: Progressing   Problem: Skin Integrity: Goal: Risk for impaired skin integrity will decrease Outcome: Progressing

## 2022-07-05 NOTE — Progress Notes (Signed)
OT Cancellation Note  Patient Details Name: Darryl Frye MRN: [id number] DOB: 08-12-1961   Cancelled Treatment:    Reason Eval/Treat Not Completed: Patient not medically ready (orders for OT Evaluation to start POD#2 (07/06/22). Will follow up tomorrow.)  [name] 07/05/2022, 9:34 AM

## 2022-07-05 NOTE — Anesthesia Postprocedure Evaluation (Signed)
Anesthesia Post Note  Patient: Darryl Frye  Procedure(s) Performed: INSERTION OF IMPLANTABLE LEFT VENTRICULAR ASSIST DEVICE; ATRICLIP 45 TRANSESOPHAGEAL ECHOCARDIOGRAM (TEE)     Patient location during evaluation: SICU Anesthesia Type: General Level of consciousness: sedated Pain management: pain level controlled Vital Signs Assessment: post-procedure vital signs reviewed and stable Respiratory status: patient remains intubated per anesthesia plan Cardiovascular status: stable Postop Assessment: no apparent nausea or vomiting Anesthetic complications: no   No notable events documented.  Last Vitals:  Vitals:   07/05/22 1031 07/05/22 1357  BP:  (!) 79/55  Pulse: 88 83  Resp:  (!) 28  Temp:    SpO2:  94%    Last Pain:  Vitals:   07/05/22 1200  TempSrc:   PainSc: 0-No pain                 Tiajuana Amass

## 2022-07-05 NOTE — Progress Notes (Addendum)
iNO wean held at this time d/t LVAD flow 3.8 (order says hold wean if <4).Will maintain iNO at current rate of 3ppm.

## 2022-07-05 NOTE — Progress Notes (Signed)
Patient ID: Darryl Frye, male   DOB: 04-Oct-1960, 61 y.o.   MRN: [id number]   Advanced Heart Failure VAD Team Note  PCP-Cardiologist: Carlyle Dolly, MD   Subjective:    - 11/13 S/P HMIII L atrial appendage closure.   Intubated, awake.   Currently Epi 7, milrinone. 0.3 mcg, and amio 30 mg per hour.  NE off.  NO weaned to 3.5. Creatinine 1.23.   CVP 20 PA 32/20  CI 2.0 Co-ox 53% LDH 326  LVAD INTERROGATION:  HeartMate III LVAD:   Flow 3.8 liters/min, speed 5100, power 3.6, PI 4.    Objective:    Vital Signs:   Temp:  [93.6 F (34.2 C)-99.9 F (37.7 C)] 99.1 F (37.3 C) (11/15 0700) Pulse Rate:  [70-207] 155 (11/15 0545) Resp:  [12-27] 16 (11/15 0700) BP: (79-104)/(65-85) 92/81 (11/15 0700) SpO2:  [92 %-100 %] 97 % (11/15 0545) Arterial Line BP: (73-122)/(63-88) 92/81 (11/15 0700) FiO2 (%):  [50 %] 50 % (11/15 0402) Weight:  [96.9 kg-97.2 kg] 97.2 kg (11/15 0501) Last BM Date : 07/01/22 Mean arterial Pressure 80s  Intake/Output:   Intake/Output Summary (Last 24 hours) at 07/05/2022 0716 Last data filed at 07/05/2022 0700 Gross per 24 hour  Intake 8135.39 ml  Output 5123 ml  Net 3012.39 ml     Physical Exam    General: Intubated, awake.  HEENT: Normal. Neck: Supple, JVP 14-16 cm. Carotids OK.  Cardiac:  Mechanical heart sounds with LVAD hum present.  Lungs:  CTAB, normal effort.  Abdomen:  NT, ND, no HSM. No bruits or masses. +BS  LVAD exit site: Well-healed and incorporated. Dressing dry and intact. No erythema or drainage. Stabilization device present and accurately applied. Driveline dressing changed daily per sterile technique. Extremities:  Warm and dry. No cyanosis, clubbing, rash. Trace ankle edema.  Neuro:  Awake, follows commands    Telemetry   SR 70s (personally reviewed)  EKG    N/A  Labs   Basic Metabolic Panel: Recent Labs  Lab 06/29/22 0410 06/29/22 0812 07/03/22 0505 07/04/22 0352 07/04/22 0835 07/04/22 1035  07/04/22 1114 07/04/22 1200 07/04/22 1203 07/04/22 1424 07/04/22 1616 07/04/22 1716 07/04/22 1955 07/04/22 2003 07/05/22 0401 07/05/22 0411  NA 131*   < > 133* 136   < > 133*   < > 137   < > 136   < > 137 136 137 136 135  K 3.7   < > 3.9 4.0   < > 4.5   < > 3.5   < > 3.2*   < > 4.1 4.2 4.2 5.3* 5.2*  CL 94*   < > 101 102   < > 99  --  99  --  105  --   --  106  --  107  --   CO2 25   < > 22 24  --   --   --   --   --  21*  --   --  19*  --  21*  --   GLUCOSE 157*   < > 209* 133*   < > 162*  --  159*  --  163*  --   --  141*  --  93  --   BUN 29*   < > 18 25*   < > 19  --  19  --  16  --   --  15  --  18  --   CREATININE 1.50*   < > 1.12  1.28*   < > 0.90  --  0.90  --  1.13  --   --  1.26*  --  1.23  --   CALCIUM 8.5*   < > 8.3* 9.1  --   --   --   --   --  7.2*  --   --  8.0*  --  8.0*  --   MG 2.5*  --   --   --   --   --   --   --   --  2.0  --   --  2.9*  --  2.7*  --   PHOS  --   --   --   --   --   --   --   --   --   --   --   --   --   --  2.7  --    < > = values in this interval not displayed.    Liver Function Tests: Recent Labs  Lab 07/05/22 0401  AST 90*  ALT 25  ALKPHOS 70  BILITOT 0.8  PROT 6.5  ALBUMIN 3.6   No results for input(s): "LIPASE", "AMYLASE" in the last 168 hours. No results for input(s): "AMMONIA" in the last 168 hours.  CBC: Recent Labs  Lab 07/03/22 0505 07/04/22 0352 07/04/22 0835 07/04/22 1115 07/04/22 1200 07/04/22 1340 07/04/22 1419 07/04/22 1424 07/04/22 1616 07/04/22 1716 07/04/22 1955 07/04/22 2003 07/05/22 0401 07/05/22 0411  WBC 6.8 7.5  --   --   --   --   --  15.7*  --   --  11.6*  --  12.6*  --   NEUTROABS  --   --   --   --   --   --   --   --   --   --   --   --  10.6*  --   HGB 15.5 15.2   < > 9.7   < > 11.1*   < > 11.1*   < > 10.9* 10.5* 11.2* 10.8* 11.2*  HCT 47.1 45.3   < > 29.8*   < > 33.1*   < > 34.5*   < > 32.0* 33.3* 33.0* 33.1* 33.0*  MCV 85.5 85.3  --   --   --   --   --  87.8  --   --  88.8  --  87.6  --    PLT 322 321  --  224  --  189  --  213  220  --   --  224  --  213  --    < > = values in this interval not displayed.    INR: Recent Labs  Lab 07/04/22 1340 07/04/22 1424 07/05/22 0401  INR 1.3* 1.3*  NOT CALCULATED 1.1    Other results: EKG:    Imaging   DG Abd Portable 1V  Result Date: 07/04/2022 CLINICAL DATA:  OG tube placement EXAM: PORTABLE ABDOMEN - 1 VIEW COMPARISON:  CT 06/28/2022 FINDINGS: Partially visualized LVAD. Esophageal tube tip in the left upper quadrant over proximal stomach. Probable lower mediastinal/chest drainage catheters. Some air-filled bowel in the right lower quadrant and pelvis without convincing evidence for an obstruction. IMPRESSION: Esophageal tube tip projects over the proximal stomach. Electronically Signed   By: Donavan Foil M.D.   On: 07/04/2022 16:25   DG Chest Port 1 View  Result Date: 07/04/2022 CLINICAL DATA:  Heart surgery EXAM: PORTABLE  CHEST 1 VIEW COMPARISON:  06/28/2022 FINDINGS: Postop median sternotomy.  Clip on the left atrial appendage. Endotracheal tube 2 cm above the carina. Swan-Ganz catheter in the main pulmonary artery. AICD unchanged. Interval placement of left ventricular assist device in satisfactory position. NG tube in the stomach. Right chest tube. Pericardial drain. No pneumothorax Mild bibasilar atelectasis.  Negative for edema or effusion. IMPRESSION: 1. Postop sternotomy with support lines in good position. 2. Mild bibasilar atelectasis. Electronically Signed   By: Franchot Gallo M.D.   On: 07/04/2022 15:10     Medications:     Scheduled Medications:  sodium chloride   Intravenous Once   acetaminophen  1,000 mg Oral Q6H   Or   acetaminophen (TYLENOL) oral liquid 160 mg/5 mL  1,000 mg Per Tube Q6H   aspirin EC  325 mg Oral Daily   Or   aspirin  324 mg Per Tube Daily   Or   aspirin  300 mg Rectal Daily   atorvastatin  20 mg Oral Daily   bisacodyl  10 mg Oral Daily   Or   bisacodyl  10 mg Rectal Daily    Chlorhexidine Gluconate Cloth  6 each Topical Daily   digoxin  0.125 mg Oral Daily   docusate sodium  200 mg Oral Daily   empagliflozin  25 mg Oral Daily   feeding supplement (GLUCERNA SHAKE)  237 mL Oral QAC lunch   furosemide  40 mg Intravenous Once   insulin glargine-yfgn  30 Units Subcutaneous Daily   levothyroxine  25 mcg Oral Q0600   magnesium oxide  400 mg Oral BID   multivitamin with minerals  1 tablet Oral Daily   mouth rinse  15 mL Mouth Rinse Q2H   [START ON 07/06/2022] pantoprazole  40 mg Oral Daily   sertraline  50 mg Oral Daily   sodium chloride flush  10-40 mL Intracatheter Q12H   sodium chloride flush  10-40 mL Intracatheter Q12H   sodium chloride flush  3 mL Intravenous Q12H   sodium chloride flush  3 mL Intravenous Q12H    Infusions:  sodium chloride Stopped (07/05/22 0554)   sodium chloride     sodium chloride     albumin human     amiodarone 30 mg/hr (07/05/22 0700)    ceFAZolin (ANCEF) IV Stopped (07/05/22 0551)   dexmedetomidine (PRECEDEX) IV infusion Stopped (07/05/22 0546)   epinephrine 6 mcg/min (07/05/22 0700)   fluconazole (DIFLUCAN) IV 100 mL/hr at 07/05/22 0700   insulin 8 mL/hr at 07/05/22 0700   lactated ringers     lactated ringers Stopped (07/04/22 2207)   lactated ringers 20 mL/hr at 07/05/22 0600   milrinone 0.3 mcg/kg/min (07/05/22 0700)   norepinephrine (LEVOPHED) Adult infusion Stopped (07/05/22 0515)   phenylephrine (NEO-SYNEPHRINE) Adult infusion     rifampin (RIFADIN) 600 mg in sodium chloride 0.9 % 100 mL IVPB 600 mg (07/05/22 0700)   vancomycin Stopped (07/04/22 2155)    PRN Medications: sodium chloride, acetaminophen, albumin human, dextrose, lactated ringers, midazolam, morphine injection, ondansetron (ZOFRAN) IV, ondansetron (ZOFRAN) IV, mouth rinse, mouth rinse, oxyCODONE, sodium chloride flush, sodium chloride flush, sodium chloride flush, traMADol   Patient Profile   Admitted with A/C HFrEF --> cardiogenic shock. S/P HMIII  LVAD  Assessment/Plan:    1. Acute on chronic systolic CHF/cardiogenic shock: Nonischemic cardiomyopathy, diagnosed 2020.  At the time, he drank heavily and used cocaine, so it is possible that this is a substance abuse-related cardiomyopathy though LV function has remained  low even with stopping ETOH and cocaine (denies use x several years).  Cath in 12/20 with no significant coronary disease.  [employer] ICD. Most recent echo in 2/23 showed EF 20-25% with normal RV.  Recently, patient has been symptomatically worse, NYHA class IV with profound orthopnea. RHC showed markedly elevated filling pressures, primarily pulmonary venous hypertension, low cardiac output, and low PAPI.  Patient was admitted for inotrope and diuresis. Echo this admission with EF <20%, mild RV dysfunction.  He was started on milrinone with improvement in hemodynamics.  We proceeded with HM3 LVAD with LAA clip on 11/14.  This morning, he is on epinephrine 7, milrinone 0.3, NO 3.5.  CI 2, co-ox 53%, CVP up to 20. Total bilirubin normal.  - Continue milrinone 0.3 and epinephrine at 7 for now.  Resend co-ox.  - Increase speed to 5200.  Tomorrow, will assess with echo to see if we can adjust speed again.  - Lasix 40 mg IV x 1 and follow response.  - Continue digoxin 0.125 daily.  - Weaning off NO, will add sildenafil 20 tid.  - Will add heparin gtt this afternoon fixed dose 500 units/hr.  2. Atrial fibrillation/flutter: S/p flutter ablation.  He mainly had atrial flutter.  He is in NSR today - Continue amiodarone gtt while on milrinone to try to keep him in rhythm.  3. H/o DVT: Will be anticoagulated.  4. Acute hypoxemic respiratory failure: Intubated currently but awake.  - Hopefully can extubate with diuresis later today.   I reviewed the LVAD parameters from today, and compared the results to the patient's prior recorded data.  No programming changes were made.  The LVAD is functioning within specified parameters.  The patient  performs LVAD self-test daily.  LVAD interrogation was negative for any significant power changes, alarms or PI events/speed drops.  LVAD equipment check completed and is in good working order.  Back-up equipment present.   LVAD education done on emergency procedures and precautions and reviewed exit site care.  CRITICAL CARE Performed by: Loralie Champagne  Total critical care time: 40 minutes  Critical care time was exclusive of separately billable procedures and treating other patients.  Critical care was necessary to treat or prevent imminent or life-threatening deterioration.  Critical care was time spent personally by me on the following activities: development of treatment plan with patient and/or surrogate as well as nursing, discussions with consultants, evaluation of patient's response to treatment, examination of patient, obtaining history from patient or surrogate, ordering and performing treatments and interventions, ordering and review of laboratory studies, ordering and review of radiographic studies, pulse oximetry and re-evaluation of patient's condition.   Length of Stay: 8  Loralie Champagne, MD 07/05/2022, 7:16 AM  VAD Team --- VAD ISSUES ONLY--- Pager [phone] (7am - 7am)  Advanced Heart Failure Team  Pager [phone] (M-F; 7a - 5p)  Please contact [facility] for night-coverage after hours (5p -7a ) and weekends on [email or web address]

## 2022-07-05 NOTE — Progress Notes (Signed)
TCTS Progress Note:  POD 1 LVAD/LAA closure  Did exceptionally well overnight  Nitric wean paused at 3 for flows dipped less than 4  On epi 7, turned up from 6 when flows dipped to 3.8  Exam:sitting up in bed while intubated, understands conversation, moving everything  AM Plan Wean nitric 1 per hour Consider extubation after nitric off Heparin 500u fixed dose start at 2pm  LVAD speed increased 5100 to 5200 Sildenafil this afternoon TTE tomorrow AM (07/06/22) Lasix 40 IV x 1 now       Latest Ref Rng & Units 07/05/2022    4:11 AM 07/05/2022    4:01 AM 07/04/2022    8:03 PM  CBC  WBC 4.0 - 10.5 K/uL  12.6    Hemoglobin 13.0 - 17.0 g/dL 11.2  10.8  11.2   Hematocrit 39.0 - 52.0 % 33.0  33.1  33.0   Platelets 150 - 400 K/uL  213         Latest Ref Rng & Units 07/05/2022    4:11 AM 07/05/2022    4:01 AM 07/04/2022    8:03 PM  CMP  Glucose 70 - 99 mg/dL  93    BUN 6 - 20 mg/dL  18    Creatinine 0.61 - 1.24 mg/dL  1.23    Sodium 135 - 145 mmol/L 135  136  137   Potassium 3.5 - 5.1 mmol/L 5.2  5.3  4.2   Chloride 98 - 111 mmol/L  107    CO2 22 - 32 mmol/L  21    Calcium 8.9 - 10.3 mg/dL  8.0    Total Protein 6.5 - 8.1 g/dL  6.5    Total Bilirubin 0.3 - 1.2 mg/dL  0.8    Alkaline Phos 38 - 126 U/L  70    AST 15 - 41 U/L  90    ALT 0 - 44 U/L  25      ABG    Component Value Date/Time   PHART 7.368 07/05/2022 0411   PCO2ART 35.1 07/05/2022 0411   PO2ART 110 (H) 07/05/2022 0411   HCO3 20.2 07/05/2022 0411   TCO2 21 (L) 07/05/2022 0411   ACIDBASEDEF 4.0 (H) 07/05/2022 0411   O2SAT 52.8 07/05/2022 0418    Vent Mode: SIMV;PRVC;PSV FiO2 (%):  [50 %] 50 % Set Rate:  [12 bmp-20 bmp] 20 bmp Vt Set:  [600 mL] 600 mL PEEP:  [5 cmH20] 5 cmH20 Pressure Support:  [10 cmH20] 10 cmH20 Plateau Pressure:  [15 cmH20-22 cmH20] 20 cmH20

## 2022-07-05 NOTE — Progress Notes (Signed)
Palliative:  Notes and course reviewed. Noted plans for diuresis and hopes for potential extubation later today. Appears to be stable post-op. Will shadow and follow along for support.   No charge  Vinie Sill, NP Palliative Medicine Team Pager [phone] (Please see [email or web address] for schedule) Team Phone [phone]

## 2022-07-05 NOTE — Progress Notes (Signed)
CT surgery PM rounds  Patient hemodynamically stable but did not pass vent wean due to drop in O2 saturation. Patient has been aggressively diuresed today. Urine output 2.5 L this shift We will wait to reattempt vent weaning until a.m. as patient is on FiO2 of 60% VAD flows 4.2 L/min, Speed 5200 RPM Medium dose epinephrine at 7 mcg/min Maintaining sinus rhythm on IV amiodarone Heparin 500 units/h started without increase in  chest tube output which remains minimal  P.m. labs Hemoglobin 10.6 Platelets 209K CBG 195 on 8 units heparin per hour   Blood pressure (!) 79/55, pulse 96, temperature 100 F (37.8 C), resp. rate (!) 27, height 5\' 11"  (1.803 m), weight 97.2 kg, SpO2 91 %.

## 2022-07-05 NOTE — Progress Notes (Signed)
iNO decreased from 20ppm to 15ppm by RT Jeneen Rinks per order. PT tolerated well, CVP 19, LVAD flow 4.2, unchanged after weaning nitric.

## 2022-07-05 NOTE — Progress Notes (Signed)
NAME:  Darryl Frye, MRN:  [id number], DOB:  November 02, 1960, LOS: 8 ADMISSION DATE:  06/27/2022, CONSULTATION DATE:  11/14 REFERRING MD:  Aundra Dubin, CHIEF COMPLAINT:  critical care support s/p LVAD   History of Present Illness:  61 year old male w/ sig h/o NICM (EF 20-25%), af/flutter. Presented 11/14 for LVAD after failing out-pt medical therapies.   Initial flow 4.5 l/min speed 5100 power 3.6 P1 2.3  Pertinent  Medical History  Afib/flutter s/p ablation May 2023 NICM EF 20-25% has ICD Remote h/o heavy ETOH and cocaine Class IV NYHF sxs.   Significant Hospital Events: Including procedures, antibiotic start and stop dates in addition to other pertinent events   11/14 LVAD placed. Returned to ICU full vent support. On iNO 20ppm, Returned to ICU post-op intubated on Neo, epi 6, and amio gtt. Initial hemodynamics PA 34/20 CO 4.9 CI 2.4 co ox 70% l CVP 10-. On full vent support. PCCM asked to assist w/ care  11/15 iNO weaned off. Got lasix. Pump speed increased to 5200. Still on epi and milrione. Awake following commands Interim History / Subjective:  Looks comfortable   Objective   Blood pressure 101/85, pulse 82, temperature 98.8 F (37.1 C), resp. rate (Abnormal) 29, height 5\' 11"  (1.803 m), weight 97.2 kg, SpO2 97 %. PAP: (23-37)/(15-30) 32/20 CVP:  [10 mmHg-23 mmHg] 19 mmHg CO:  [3.7 L/min-5.6 L/min] 4.2 L/min CI:  [1.7 L/min/m2-2.7 L/min/m2] 2 L/min/m2  Vent Mode: SIMV;PRVC;PSV FiO2 (%):  [40 %-50 %] 40 % Set Rate:  [12 bmp-20 bmp] 20 bmp Vt Set:  [600 mL] 600 mL PEEP:  [5 cmH20] 5 cmH20 Pressure Support:  [10 cmH20] 10 cmH20 Plateau Pressure:  [15 cmH20-22 cmH20] 20 cmH20   Intake/Output Summary (Last 24 hours) at 07/05/2022 0949 Last data filed at 07/05/2022 0900 Gross per 24 hour  Intake 8025.39 ml  Output 5668 ml  Net 2357.39 ml   Filed Weights   07/04/22 0456 07/05/22 0500 07/05/22 0501  Weight: 90.3 kg 96.9 kg 97.2 kg    Examination: General 61 year old male  resting in bed. No distress HENT NCAT right IJ CVL site CD&I Card + hum from LVAD. Incision CD&I Pulm clear. No accessory use Abd soft not tender  Ext warm and dry  Neuro awake and oriented.  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 s/p LVAD Plan Cont epi and milrinone as directed by advanced HF Off iNO, start sildenofil tid  Cont dig Cont to monitor hemodynamics HMIII rx per hf and cardiac surg team (speed increased today_)  Post-operative coagulapathy  Plan Monitor and correct as indicated  Need for mechanical ventilation  Plan Repeat abg 10:30 if all looks good SBT Cont VAP bundle  PAD protocol RASS goal 0   H/o afib/flutter s/p prior ablation -previously on DOAC, currently in NSR Plan Cont tele Cont amiodarone while on milrinone    H/o DVT Plan AC to be resumed when OK w/ cardiac surg-->they are planning on adding IV heparin this afternoon  Best Practice (right click and "Reselect all SmartList Selections" daily)   Diet/type: NPO DVT prophylaxis: SCD GI prophylaxis: H2B Lines: Central line Foley:  Yes, and it is still needed Code Status:  limited Last date of multidisciplinary goals of care discussion [NA]   Critical care time: 63 min    Erick Colace ACNP-BC [place] Pager #  [phone] OR # [phone] if no answer

## 2022-07-05 NOTE — Progress Notes (Signed)
LVAD Coordinator Rounding Note:  Admitted following [facility] due to acute on chronic CHF and VAD eval to Dr Claris Gladden service.   HM 3 LVAD implanted on 07/04/22 by Dr Tenny Craw under destination criteria due to uncontrolled diabetes.  Pt remains intubated this morning. He is alert, following commands. Plan to diurese, continue weaning nitric, then start SBT.   Speed increased to 5200 per Dr Aundra Dubin. Tolerating well. Will plan bedside echo tomorrow AM @ 0900 with Dr Aundra Dubin.   Spoke with pt's caregiver [name]. Will plan for her to observe dressing change on Friday morning at 11:00.   Vital signs: Temp: 98.8 HR: 83 Doppler Pressure:96 Arterial BP: 97/82 (89) O2 Sat: 97% on FiO2 40% Wt: 214.3 lb  LVAD interrogation reveals:  Speed: 5200 Flow: 3.9 Power: 3.6 w PI: 4.5   Alarms: none Events:  none Hematocrit: 33  Fixed speed: 5200 Low speed limit: 4900   Drive Line: Existing VAD dressing removed and site care performed using sterile technique. Drive line exit site cleaned with Chlora prep applicators x 2, allowed to dry, and gauze dressing with silver strip applied. Exit site  unincorporated, the velour is fully implanted at exit site. 3 blue sutures, and 1 black suture in place. Slight bruising at exit site. Moderate amount of serosanguinous drainage on previous dressing. No redness, tenderness,  foul odor or rash noted. Drive line anchor re-applied. Continue daily dressing changes per VAD coordinator, nurse champion, or trained caregiver.   Discussed sutures and anchor placement with Dr Tenny Craw. Will plan to remove distal blue suture tomorrow to aid in better anchor positioning.       Labs:  LDH trend: 326  INR trend: 1.1  WBC trend: 12.6  Anticoagulation Plan: -INR Goal: 2.0 - 2.5 -ASA Dose: 325 mg until INR therapeutic   Blood Products:  IntraOp 11/14:  - 4 FFP  - DDAVP - 617 cell saver  PostOp 11/14: - 2 FFP  Device: -[employer] -Therapies: OFF  Arrythmias: Hx  a-flutter. NSR today. Currently on Amio @ 30 mg  Respiratory: Intubated on 3 ppm Nitric oxide (weaning)  Renal:  -BUN/CRT: 18/1.23  Drips: Milrinone 0.3 mcg/kg/hr Amiodarone 30 mg/hr Epinephrine 7 mcg/min Precedex  Insulin  Pt Education: Pt intubated. No family at bedside. Education inappropriate at this time.   Plan/Recommendations:  1. Page VAD coordinator for equipment or drive line issues. 2. Daily drive line dressing changes per VAD coordinator, nurse champion, or trained caregiver.  Emerson Monte RN [place] Coordinator  Office: [phone]  24/7 Pager: [phone]

## 2022-07-05 NOTE — Progress Notes (Signed)
   Given 40 mg IV lasix x1 ---.Good response with 1.8 liters urine output.   CVP trending down.   Calina Patrie NP-C 3:41 PM

## 2022-07-05 NOTE — Progress Notes (Signed)
iNO weaned from 10ppm to 5ppm. CVP 16, LVAD flow 4.1, unchanged after wean.

## 2022-07-06 ENCOUNTER — Inpatient Hospital Stay (HOSPITAL_COMMUNITY): Payer: Medicare Other

## 2022-07-06 ENCOUNTER — Encounter: Payer: Self-pay | Admitting: Adult Health

## 2022-07-06 ENCOUNTER — Inpatient Hospital Stay: Payer: Self-pay

## 2022-07-06 DIAGNOSIS — I5021 Acute systolic (congestive) heart failure: Secondary | ICD-10-CM

## 2022-07-06 DIAGNOSIS — R570 Cardiogenic shock: Secondary | ICD-10-CM | POA: Diagnosis not present

## 2022-07-06 LAB — CBC WITH DIFFERENTIAL/PLATELET
Abs Immature Granulocytes: 0.17 10*3/uL — ABNORMAL HIGH (ref 0.00–0.07)
Basophils Absolute: 0.1 10*3/uL (ref 0.0–0.1)
Basophils Relative: 0 %
Eosinophils Absolute: 0 10*3/uL (ref 0.0–0.5)
Eosinophils Relative: 0 %
HCT: 31.3 % — ABNORMAL LOW (ref 39.0–52.0)
Hemoglobin: 10.3 g/dL — ABNORMAL LOW (ref 13.0–17.0)
Immature Granulocytes: 1 %
Lymphocytes Relative: 5 %
Lymphs Abs: 0.8 10*3/uL (ref 0.7–4.0)
MCH: 28.2 pg (ref 26.0–34.0)
MCHC: 32.9 g/dL (ref 30.0–36.0)
MCV: 85.8 fL (ref 80.0–100.0)
Monocytes Absolute: 1.9 10*3/uL — ABNORMAL HIGH (ref 0.1–1.0)
Monocytes Relative: 10 %
Neutro Abs: 15.1 10*3/uL — ABNORMAL HIGH (ref 1.7–7.7)
Neutrophils Relative %: 84 %
Platelets: 199 10*3/uL (ref 150–400)
RBC: 3.65 MIL/uL — ABNORMAL LOW (ref 4.22–5.81)
RDW: 15.5 % (ref 11.5–15.5)
WBC: 18 10*3/uL — ABNORMAL HIGH (ref 4.0–10.5)
nRBC: 0 % (ref 0.0–0.2)

## 2022-07-06 LAB — COMPREHENSIVE METABOLIC PANEL
ALT: 27 U/L (ref 0–44)
AST: 102 U/L — ABNORMAL HIGH (ref 15–41)
Albumin: 3.1 g/dL — ABNORMAL LOW (ref 3.5–5.0)
Alkaline Phosphatase: 74 U/L (ref 38–126)
Anion gap: 8 (ref 5–15)
BUN: 24 mg/dL — ABNORMAL HIGH (ref 6–20)
CO2: 21 mmol/L — ABNORMAL LOW (ref 22–32)
Calcium: 8.3 mg/dL — ABNORMAL LOW (ref 8.9–10.3)
Chloride: 109 mmol/L (ref 98–111)
Creatinine, Ser: 1.51 mg/dL — ABNORMAL HIGH (ref 0.61–1.24)
GFR, Estimated: 53 mL/min — ABNORMAL LOW (ref >60)
Glucose, Bld: 150 mg/dL — ABNORMAL HIGH (ref 70–99)
Potassium: 4.3 mmol/L (ref 3.5–5.1)
Sodium: 138 mmol/L (ref 135–145)
Total Bilirubin: 0.6 mg/dL (ref 0.3–1.2)
Total Protein: 6.4 g/dL — ABNORMAL LOW (ref 6.5–8.1)

## 2022-07-06 LAB — POCT I-STAT 7, (LYTES, BLD GAS, ICA,H+H)
Acid-base deficit: 5 mmol/L — ABNORMAL HIGH (ref 0.0–2.0)
Acid-base deficit: 2 mmol/L (ref 0.0–2.0)
Acid-base deficit: 2 mmol/L (ref 0.0–2.0)
Acid-base deficit: 3 mmol/L — ABNORMAL HIGH (ref 0.0–2.0)
Acid-base deficit: 2 mmol/L (ref 0.0–2.0)
Bicarbonate: 18.8 mmol/L — ABNORMAL LOW (ref 20.0–28.0)
Bicarbonate: 21.8 mmol/L (ref 20.0–28.0)
Bicarbonate: 21.2 mmol/L (ref 20.0–28.0)
Bicarbonate: 20.3 mmol/L (ref 20.0–28.0)
Bicarbonate: 20.6 mmol/L (ref 20.0–28.0)
Calcium, Ion: 1.09 mmol/L — ABNORMAL LOW (ref 1.15–1.40)
Calcium, Ion: 1.13 mmol/L — ABNORMAL LOW (ref 1.15–1.40)
Calcium, Ion: 1.12 mmol/L — ABNORMAL LOW (ref 1.15–1.40)
Calcium, Ion: 1.12 mmol/L — ABNORMAL LOW (ref 1.15–1.40)
Calcium, Ion: 1.1 mmol/L — ABNORMAL LOW (ref 1.15–1.40)
HCT: 33 % — ABNORMAL LOW (ref 39.0–52.0)
HCT: 32 % — ABNORMAL LOW (ref 39.0–52.0)
HCT: 32 % — ABNORMAL LOW (ref 39.0–52.0)
HCT: 33 % — ABNORMAL LOW (ref 39.0–52.0)
HCT: 33 % — ABNORMAL LOW (ref 39.0–52.0)
Hemoglobin: 11.2 g/dL — ABNORMAL LOW (ref 13.0–17.0)
Hemoglobin: 10.9 g/dL — ABNORMAL LOW (ref 13.0–17.0)
Hemoglobin: 10.9 g/dL — ABNORMAL LOW (ref 13.0–17.0)
Hemoglobin: 11.2 g/dL — ABNORMAL LOW (ref 13.0–17.0)
Hemoglobin: 11.2 g/dL — ABNORMAL LOW (ref 13.0–17.0)
O2 Saturation: 91 %
O2 Saturation: 98 %
O2 Saturation: 94 %
O2 Saturation: 96 %
O2 Saturation: 94 %
Patient temperature: 37.9
Patient temperature: 37.5
Patient temperature: 37.8
Patient temperature: 37.9
Patient temperature: 37.9
Potassium: 4.9 mmol/L (ref 3.5–5.1)
Potassium: 4.3 mmol/L (ref 3.5–5.1)
Potassium: 3.7 mmol/L (ref 3.5–5.1)
Potassium: 3.6 mmol/L (ref 3.5–5.1)
Potassium: 3.6 mmol/L (ref 3.5–5.1)
Sodium: 134 mmol/L — ABNORMAL LOW (ref 135–145)
Sodium: 138 mmol/L (ref 135–145)
Sodium: 139 mmol/L (ref 135–145)
Sodium: 138 mmol/L (ref 135–145)
Sodium: 138 mmol/L (ref 135–145)
TCO2: 20 mmol/L — ABNORMAL LOW (ref 22–32)
TCO2: 23 mmol/L (ref 22–32)
TCO2: 22 mmol/L (ref 22–32)
TCO2: 21 mmol/L — ABNORMAL LOW (ref 22–32)
TCO2: 21 mmol/L — ABNORMAL LOW (ref 22–32)
pCO2 arterial: 29.9 mmHg — ABNORMAL LOW (ref 32–48)
pCO2 arterial: 34 mmHg (ref 32–48)
pCO2 arterial: 30.6 mmHg — ABNORMAL LOW (ref 32–48)
pCO2 arterial: 30.2 mmHg — ABNORMAL LOW (ref 32–48)
pCO2 arterial: 30.5 mmHg — ABNORMAL LOW (ref 32–48)
pH, Arterial: 7.41 (ref 7.35–7.45)
pH, Arterial: 7.417 (ref 7.35–7.45)
pH, Arterial: 7.452 — ABNORMAL HIGH (ref 7.35–7.45)
pH, Arterial: 7.438 (ref 7.35–7.45)
pH, Arterial: 7.441 (ref 7.35–7.45)
pO2, Arterial: 62 mmHg — ABNORMAL LOW (ref 83–108)
pO2, Arterial: 111 mmHg — ABNORMAL HIGH (ref 83–108)
pO2, Arterial: 68 mmHg — ABNORMAL LOW (ref 83–108)
pO2, Arterial: 83 mmHg (ref 83–108)
pO2, Arterial: 68 mmHg — ABNORMAL LOW (ref 83–108)

## 2022-07-06 LAB — ECHO INTRAOPERATIVE TEE
AV Mean grad: 3 mmHg
AV Peak grad: 6.2 mmHg
Ao pk vel: 1.24 m/s
Height: 71 in
Weight: 3185.21 oz

## 2022-07-06 LAB — BASIC METABOLIC PANEL
Anion gap: 10 (ref 5–15)
Anion gap: 13 (ref 5–15)
BUN: 26 mg/dL — ABNORMAL HIGH (ref 6–20)
BUN: 26 mg/dL — ABNORMAL HIGH (ref 6–20)
CO2: 20 mmol/L — ABNORMAL LOW (ref 22–32)
CO2: 20 mmol/L — ABNORMAL LOW (ref 22–32)
Calcium: 8.4 mg/dL — ABNORMAL LOW (ref 8.9–10.3)
Calcium: 8.3 mg/dL — ABNORMAL LOW (ref 8.9–10.3)
Chloride: 110 mmol/L (ref 98–111)
Chloride: 106 mmol/L (ref 98–111)
Creatinine, Ser: 1.46 mg/dL — ABNORMAL HIGH (ref 0.61–1.24)
Creatinine, Ser: 1.51 mg/dL — ABNORMAL HIGH (ref 0.61–1.24)
GFR, Estimated: 55 mL/min — ABNORMAL LOW (ref >60)
GFR, Estimated: 53 mL/min — ABNORMAL LOW (ref >60)
Glucose, Bld: 153 mg/dL — ABNORMAL HIGH (ref 70–99)
Glucose, Bld: 174 mg/dL — ABNORMAL HIGH (ref 70–99)
Potassium: 4.2 mmol/L (ref 3.5–5.1)
Potassium: 3.8 mmol/L (ref 3.5–5.1)
Sodium: 140 mmol/L (ref 135–145)
Sodium: 139 mmol/L (ref 135–145)

## 2022-07-06 LAB — MAGNESIUM
Magnesium: 2.7 mg/dL — ABNORMAL HIGH (ref 1.7–2.4)
Magnesium: 2.6 mg/dL — ABNORMAL HIGH (ref 1.7–2.4)
Magnesium: 2.4 mg/dL (ref 1.7–2.4)

## 2022-07-06 LAB — GLUCOSE, CAPILLARY
Glucose-Capillary: 146 mg/dL — ABNORMAL HIGH (ref 70–99)
Glucose-Capillary: 129 mg/dL — ABNORMAL HIGH (ref 70–99)
Glucose-Capillary: 159 mg/dL — ABNORMAL HIGH (ref 70–99)
Glucose-Capillary: 145 mg/dL — ABNORMAL HIGH (ref 70–99)
Glucose-Capillary: 156 mg/dL — ABNORMAL HIGH (ref 70–99)
Glucose-Capillary: 180 mg/dL — ABNORMAL HIGH (ref 70–99)

## 2022-07-06 LAB — PROTIME-INR
INR: 1.5 — ABNORMAL HIGH (ref 0.8–1.2)
Prothrombin Time: 18 seconds — ABNORMAL HIGH (ref 11.4–15.2)

## 2022-07-06 LAB — COOXEMETRY PANEL
Carboxyhemoglobin: 0.6 % (ref 0.5–1.5)
Methemoglobin: <0.7 % (ref 0.0–1.5)
O2 Saturation: 54 %
Total hemoglobin: 10.7 g/dL — ABNORMAL LOW (ref 12.0–16.0)

## 2022-07-06 LAB — HEPARIN LEVEL (UNFRACTIONATED)
Heparin Unfractionated: <0.1 IU/mL — ABNORMAL LOW (ref 0.30–0.70)
Heparin Unfractionated: <0.1 IU/mL — ABNORMAL LOW (ref 0.30–0.70)

## 2022-07-06 LAB — APTT
aPTT: 37 seconds — ABNORMAL HIGH (ref 24–36)
aPTT: 39 seconds — ABNORMAL HIGH (ref 24–36)

## 2022-07-06 LAB — CALCIUM, IONIZED: Calcium, Ionized, Serum: 4.1 mg/dL — ABNORMAL LOW (ref 4.5–5.6)

## 2022-07-06 LAB — LACTATE DEHYDROGENASE: LDH: 400 U/L — ABNORMAL HIGH (ref 98–192)

## 2022-07-06 LAB — PHOSPHORUS: Phosphorus: 3.4 mg/dL (ref 2.5–4.6)

## 2022-07-06 MED ORDER — MELATONIN 3 MG PO TABS
3.0000 mg | ORAL_TABLET | Freq: Every day | ORAL | Status: DC
  Administered 2022-07-06 – 2022-07-20 (×15): 3 mg via ORAL
  Filled 2022-07-06 (×15): qty 1

## 2022-07-06 MED ORDER — FUROSEMIDE 10 MG/ML IJ SOLN
10.0000 mg/h | INTRAVENOUS | Status: DC
  Administered 2022-07-06: 15 mg/h via INTRAVENOUS
  Administered 2022-07-06: 10 mg/h via INTRAVENOUS
  Administered 2022-07-07 (×2): 15 mg/h via INTRAVENOUS
  Administered 2022-07-08: 10 mg/h via INTRAVENOUS
  Administered 2022-07-08: 15 mg/h via INTRAVENOUS
  Administered 2022-07-09 – 2022-07-11 (×4): 10 mg/h via INTRAVENOUS
  Administered 2022-07-12: 8 mg/h via INTRAVENOUS
  Filled 2022-07-06 (×14): qty 20

## 2022-07-06 MED ORDER — FUROSEMIDE 10 MG/ML IJ SOLN
40.0000 mg | Freq: Once | INTRAMUSCULAR | Status: AC
  Administered 2022-07-06: 40 mg via INTRAVENOUS
  Filled 2022-07-06: qty 4

## 2022-07-06 MED ORDER — IPRATROPIUM-ALBUTEROL 0.5-2.5 (3) MG/3ML IN SOLN
3.0000 mL | Freq: Four times a day (QID) | RESPIRATORY_TRACT | Status: DC
  Administered 2022-07-06 – 2022-07-14 (×32): 3 mL via RESPIRATORY_TRACT
  Filled 2022-07-06 (×34): qty 3

## 2022-07-06 MED ORDER — ALPRAZOLAM 0.25 MG PO TABS
0.2500 mg | ORAL_TABLET | Freq: Every evening | ORAL | Status: DC | PRN
  Administered 2022-07-06: 0.25 mg via ORAL
  Filled 2022-07-06: qty 1

## 2022-07-06 MED ORDER — HYDRALAZINE HCL 20 MG/ML IJ SOLN
INTRAMUSCULAR | Status: AC
  Filled 2022-07-06: qty 1

## 2022-07-06 MED ORDER — HYDRALAZINE HCL 20 MG/ML IJ SOLN
20.0000 mg | INTRAMUSCULAR | Status: DC | PRN
  Administered 2022-07-06: 20 mg via INTRAVENOUS

## 2022-07-06 MED ORDER — ORAL CARE MOUTH RINSE: 15.0000 mL | OROMUCOSAL | Status: DC | PRN

## 2022-07-06 MED ORDER — LIDOCAINE 5 % EX PTCH
2.0000 | MEDICATED_PATCH | CUTANEOUS | Status: DC
  Administered 2022-07-06 – 2022-07-20 (×15): 2 via TRANSDERMAL
  Filled 2022-07-06 (×16): qty 2

## 2022-07-06 MED ORDER — POTASSIUM CHLORIDE 10 MEQ/50ML IV SOLN
10.0000 meq | INTRAVENOUS | Status: AC
  Administered 2022-07-06 (×3): 10 meq via INTRAVENOUS
  Filled 2022-07-06 (×3): qty 50

## 2022-07-06 MED ORDER — HYDRALAZINE HCL 25 MG PO TABS
25.0000 mg | ORAL_TABLET | Freq: Four times a day (QID) | ORAL | Status: DC | PRN
  Administered 2022-07-06: 25 mg via ORAL
  Filled 2022-07-06: qty 1

## 2022-07-06 MED ORDER — ORAL CARE MOUTH RINSE
15.0000 mL | OROMUCOSAL | Status: DC
  Administered 2022-07-06: 15 mL via OROMUCOSAL

## 2022-07-06 NOTE — Progress Notes (Signed)
NAME:  Darryl Frye, MRN:  [id number], DOB:  1961-05-01, LOS: 9 ADMISSION DATE:  06/27/2022, CONSULTATION DATE:  11/14 REFERRING MD:  Aundra Dubin, CHIEF COMPLAINT:  critical care support s/p LVAD   History of Present Illness:  61 year old male w/ sig h/o NICM (EF 20-25%), af/flutter. Presented 11/14 for LVAD after failing out-pt medical therapies.   Initial flow 4.5 l/min speed 5100 power 3.6 P1 2.3  Pertinent  Medical History  Afib/flutter s/p ablation May 2023 NICM EF 20-25% has ICD Remote h/o heavy ETOH and cocaine Class IV NYHF sxs.   Significant Hospital Events: Including procedures, antibiotic start and stop dates in addition to other pertinent events   11/14 LVAD placed. Returned to ICU full vent support. On iNO 20ppm, Returned to ICU post-op intubated on Neo, epi 6, and amio gtt. Initial hemodynamics PA 34/20 CO 4.9 CI 2.4 co ox 70% l CVP 10-. On full vent support. PCCM asked to assist w/ care  11/15 iNO weaned off. Got lasix. Pump speed increased to 5200. Still on epi and milrione. Awake following commands 11/15 attempted SBT but saturations dropped Interim History / Subjective:  Tolerated 20 min 10/5 but saturations dropped quickly to upper 80's. No distress.    Objective   Blood pressure 100/72, pulse (!) 108, temperature 100 F (37.8 C), resp. rate (!) 30, height 5\' 11"  (1.803 m), weight 94.7 kg, SpO2 97 %. PAP: (26-45)/(12-27) 38/21 CVP:  [12 mmHg-22 mmHg] 17 mmHg CO:  [3.8 L/min-6.4 L/min] 4.3 L/min CI:  [1.8 L/min/m2-3.1 L/min/m2] 2 L/min/m2  Vent Mode: SIMV;PSV;PRVC FiO2 (%):  [40 %-60 %] 50 % Set Rate:  [20 bmp] 20 bmp Vt Set:  [600 mL] 600 mL PEEP:  [5 cmH20] 5 cmH20 Pressure Support:  [10 cmH20] 10 cmH20 Plateau Pressure:  [12 cmH20-20 cmH20] 12 cmH20   Intake/Output Summary (Last 24 hours) at 07/06/2022 0721 Last data filed at 07/06/2022 0700 Gross per 24 hour  Intake 2623.23 ml  Output 5010 ml  Net -2386.77 ml    Filed Weights   07/05/22 0500 07/05/22  0501 07/06/22 0500  Weight: 96.9 kg 97.2 kg 94.7 kg   Examination: General 61 year old male resting in bed. No distress HENT NCAT right IJ CVL site CD&I Card + hum from LVAD. Incision CD&I. On 5200 rpm Pulm rhonchi bilaterally.  Abd soft not tender  Ext warm and dry  Neuro awake and oriented.  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  Cont dig Cont to monitor hemodynamics HMIII rx per hf and cardiac surg team (speed increased today_) Needs fluid removal prior to extubation - start furosemide infusion.   Post-operative coagulapathy  Plan Monitor and correct as indicated  Need for mechanical ventilation  Needs diuresis prior to SBT/extubation as saturations marginal PAD protocol RASS goal 0, -1   H/o afib/flutter s/p prior ablation -previously on DOAC, currently in NSR Cont tele Cont amiodarone while on milrinone   H/o DVT AC to be resumed when OK w/ cardiac surg-->they are planning on adding IV heparin this afternoon  Best Practice (right click and "Reselect all SmartList Selections" daily)   Diet/type: NPO  Consider tube feeds if not extubated tomorrow.  DVT  prophylaxis: SCD - fixed dose heparin, start warfarin once extubated.  GI prophylaxis: H2B Lines: Central line Foley:  Yes, and it is still needed Code Status:  limited Last date of multidisciplinary goals of care discussion [NA]  CRITICAL CARE Performed by: Kipp Brood   Total critical care time: 40 minutes  Critical care time was exclusive of separately billable procedures and treating other patients.  Critical care was necessary to treat or prevent imminent or life-threatening  deterioration.  Critical care was time spent personally by me on the following activities: development of treatment plan with patient and/or surrogate as well as nursing, discussions with consultants, evaluation of patient's response to treatment, examination of patient, obtaining history from patient or surrogate, ordering and performing treatments and interventions, ordering and review of laboratory studies, ordering and review of radiographic studies, pulse oximetry, re-evaluation of patient's condition and participation in multidisciplinary rounds.  Kipp Brood, MD [facility] ICU Physician [place]  Pager: [phone] Mobile: [phone] After hours: [phone].

## 2022-07-06 NOTE — Progress Notes (Signed)
ETT advanced 3cm per MD order. ETT is nopw placed @ 27cm.

## 2022-07-06 NOTE — Progress Notes (Signed)
  Echocardiogram 2D Echocardiogram has been performed.  Darryl Frye 07/06/2022, 10:04 AM

## 2022-07-06 NOTE — Progress Notes (Signed)
OT Cancellation Note  Patient Details Name: Darryl Frye MRN: [id number] DOB: September 16, 1960   Cancelled Treatment:    Reason Eval/Treat Not Completed: Patient not medically ready (remains intubated)  [name] 07/06/2022, 8:03 AM [name], OTR/L [place] Office: [phone]

## 2022-07-06 NOTE — Progress Notes (Addendum)
ANTICOAGULATION CONSULT NOTE   Pharmacy Consult for heparin Indication:  LVAD  No Known Allergies  Patient Measurements: Height: 5\' 11"  (180.3 cm) Weight: 94.7 kg (208 lb 12.4 oz) (with system controller) IBW/kg (Calculated) : 75.3 Heparin Dosing Weight: 95 kg  Vital Signs: Temp: 100 F (37.8 C) (11/16 0812) Pulse Rate: 166 (11/16 0812)  Labs: Recent Labs    07/04/22 1340 07/04/22 1419 07/04/22 1424 07/04/22 1616 07/05/22 0401 07/05/22 0411 07/05/22 1738 07/05/22 1742 07/05/22 1946 07/06/22 0413 07/06/22 0414  HGB 11.1*   < > 11.1*   < > 10.8*   < > 10.6* 11.2*  --  10.9* 10.3*  HCT 33.1*   < > 34.5*   < > 33.1*   < > 32.1* 33.0*  --  32.0* 31.3*  PLT 189  --  213  220   < > 213  --  209  --   --   --  199  APTT 31  --  31  DUPLICATE  --   --   --   --   --   --   --  37*  LABPROT 15.8*  --  34.2*  DUPLICATE  --  87.6  --   --   --   --   --  18.0*  INR 1.3*  --  1.3*  NOT CALCULATED  --  1.1  --   --   --   --   --  1.5*  HEPARINUNFRC  --   --   --   --  <0.10*  --   --   --  <0.10*  --  <0.10*  CREATININE  --   --  1.13   < > 1.23  --  1.44*  --   --   --  1.51*   < > = values in this interval not displayed.     Estimated Creatinine Clearance: 61.9 mL/min (A) (by C-G formula based on SCr of 1.51 mg/dL (H)).   Medical History: Past Medical History:  Diagnosis Date   Acute kidney injury ([place])    Acute respiratory failure ([facility])    Atrial flutter (H[facility])    on Eliquis   CHF (congestive heart failure) ([place])    EF 20-25% 10/15/21, AICD in place.   Coronary artery disease    Diabetes mellitus without complication (H[facility])    DVT (deep venous thrombosis) ([place]) 06/2019   Heart murmur    Hypertension    Presence of permanent cardiac pacemaker    Renal disorder     Medications:  Scheduled:   sodium chloride   Intravenous Once   acetaminophen  1,000 mg Oral Q6H   Or   acetaminophen (TYLENOL) oral liquid 160 mg/5 mL  1,000 mg Per Tube Q6H   aspirin EC  325 mg  Oral Daily   Or   aspirin  324 mg Per Tube Daily   Or   aspirin  300 mg Rectal Daily   atorvastatin  20 mg Oral Daily   bisacodyl  10 mg Oral Daily   Or   bisacodyl  10 mg Rectal Daily   Chlorhexidine Gluconate Cloth  6 each Topical Daily   digoxin  0.125 mg Oral Daily   docusate  200 mg Per Tube Daily   feeding supplement (GLUCERNA SHAKE)  237 mL Oral QAC lunch   insulin aspart  0-24 Units Subcutaneous Q4H   insulin glargine-yfgn  30 Units Subcutaneous Daily   ipratropium-albuterol  3 mL Nebulization Q6H  levothyroxine  25 mcg Oral Q0600   magnesium oxide  400 mg Oral BID   multivitamin with minerals  1 tablet Oral Daily   mouth rinse  15 mL Mouth Rinse Q2H   pantoprazole  40 mg Oral Daily   sertraline  50 mg Oral Daily   sildenafil  20 mg Oral TID   sodium chloride flush  10-40 mL Intracatheter Q12H   sodium chloride flush  10-40 mL Intracatheter Q12H   sodium chloride flush  3 mL Intravenous Q12H   sodium chloride flush  3 mL Intravenous Q12H    Assessment: 70 yom admitted with HF now s/p HM3 LVAD with LAA clip on 11/14. On apixaban PTA for hx Aflutter (LD 11/6 PTA, now 11/9) - heparin level came back undetectable 11/15 so will not impact levels moving forward.   Hgb 10.3, PLTs 199. LDH 400. No s/sx of bleeding or issues with infusion noted by RN this morning. Heparin level undetectable this morning and aPTT 37 on fixed rate of 500 units/hr. Discussed with Dr. Tenny Craw. Will monitor aPTT and heparin level and slowly adjust to target lower end of therapeutic range (0.3 for heparin level, 65 for aPTT).   Goal of Therapy:  Heparin level 0.3-0.5 units/ml aPTT 65-85 seconds  Targeting lower end of heparin level and aPTT goal Monitor platelets by anticoagulation protocol: Yes   Plan:  Increase heparin infusion to 600 units/hr  Check 6 hour heparin level/aPTT Monitor daily heparin level/aPTT, CBC, and for s/sx of bleeding  F/u plans for starting warfarin when is patient  extubated   Eliseo Gum, PharmD PGY1 Pharmacy Resident   07/06/2022  8:40 AM   ADDENDUM:   aPTT 39 low (heparin level <0.10 undetectable). Initially at time of prior rate increase, IV lost and bleeding at IV site was controlled with pressure. Since then, no s/sx bleeding or issues noted. Will increase heparin to 700 units/hr and proceed with checking AM heparin level/aPTT on 11/17.

## 2022-07-06 NOTE — Progress Notes (Signed)
     [address]       [place] [address]             [phone]       EVENING ROUNDS  SP LVAD Hemodynamics good Diuresing

## 2022-07-06 NOTE — Progress Notes (Signed)
Patient ID: Darryl Frye, male   DOB: May 12, 1961, 61 y.o.   MRN: [id number]   Advanced Heart Failure VAD Team Note  PCP-Cardiologist: Carlyle Dolly, MD   Subjective:    - 11/13 S/P HMIII L atrial appendage closure.   Intubated, awake.   Currently Epi 6, milrinone. 0.375 mcg, and amio 30 mg per hour.  NE and NO off.  On sildenafil 20 tid. Creatinine 1.23 => 1.5. Good diuresis with IV Lasix boluses yesterday, net negative I/Os 2387.   Tm 100.2, WBCs 18  CXR with bibasilar infiltrates  CVP 21 PA 55/19  CI 2.3 PAPI 1.7 Co-ox 54% LDH 326 => 400  LVAD INTERROGATION:  HeartMate III LVAD:   Flow 4.3 liters/min, speed 5200, power 3.7, PI 2.9.    Objective:    Vital Signs:   Temp:  [98.6 F (37 C)-100.4 F (38 C)] 100 F (37.8 C) (11/16 0700) Pulse Rate:  [30-183] 108 (11/16 0700) Resp:  [18-38] 30 (11/16 0700) BP: (79-101)/(55-85) 100/72 (11/15 2000) SpO2:  [85 %-99 %] 95 % (11/16 0748) Arterial Line BP: (71-115)/(59-91) 88/76 (11/16 0700) FiO2 (%):  [40 %-60 %] 50 % (11/16 0558) Weight:  [94.7 kg] 94.7 kg (11/16 0500) Last BM Date : 07/01/22 Mean arterial Pressure 70s-80s  Intake/Output:   Intake/Output Summary (Last 24 hours) at 07/06/2022 0754 Last data filed at 07/06/2022 0700 Gross per 24 hour  Intake 2623.23 ml  Output 5010 ml  Net -2386.77 ml     Physical Exam    General: Intubated HEENT: Normal. Neck: Supple, JVP 16 cm. Carotids OK.  Cardiac:  Mechanical heart sounds with LVAD hum present.  Lungs:  Crackles dependently.  Abdomen:  NT, ND, no HSM. No bruits or masses. +BS  LVAD exit site: Well-healed and incorporated. Dressing dry and intact. No erythema or drainage. Stabilization device present and accurately applied. Driveline dressing changed daily per sterile technique. Extremities:  Warm and dry. No cyanosis, clubbing, rash, or edema.  Neuro:  Alert on vent. Cranial nerves grossly intact. Moves all 4 extremities w/o difficulty. Affect pleasant      Telemetry   SR 90s (personally reviewed)  EKG    N/A  Labs   Basic Metabolic Panel: Recent Labs  Lab 07/04/22 1424 07/04/22 1616 07/04/22 1955 07/04/22 2003 07/05/22 0401 07/05/22 0411 07/05/22 1110 07/05/22 1738 07/05/22 1742 07/06/22 0413 07/06/22 0414  NA 136   < > 136   < > 136   < > 137 135 134* 138 138  K 3.2*   < > 4.2   < > 5.3*   < > 4.5 5.1 4.9 4.3 4.3  CL 105  --  106  --  107  --   --  105  --   --  109  CO2 21*  --  19*  --  21*  --   --  18*  --   --  21*  GLUCOSE 163*  --  141*  --  93  --   --  188*  --   --  150*  BUN 16  --  15  --  18  --   --  20  --   --  24*  CREATININE 1.13  --  1.26*  --  1.23  --   --  1.44*  --   --  1.51*  CALCIUM 7.2*  --  8.0*  --  8.0*  --   --  8.2*  --   --  8.3*  MG 2.0  --  2.9*  --  2.7*  --   --  2.7*  --   --  2.7*  PHOS  --   --   --   --  2.7  --   --   --   --   --  3.4   < > = values in this interval not displayed.    Liver Function Tests: Recent Labs  Lab 07/05/22 0401 07/06/22 0414  AST 90* 102*  ALT 25 27  ALKPHOS 70 74  BILITOT 0.8 0.6  PROT 6.5 6.4*  ALBUMIN 3.6 3.1*   No results for input(s): "LIPASE", "AMYLASE" in the last 168 hours. No results for input(s): "AMMONIA" in the last 168 hours.  CBC: Recent Labs  Lab 07/04/22 1424 07/04/22 1616 07/04/22 1955 07/04/22 2003 07/05/22 0401 07/05/22 0411 07/05/22 1110 07/05/22 1738 07/05/22 1742 07/06/22 0413 07/06/22 0414  WBC 15.7*  --  11.6*  --  12.6*  --   --  16.5*  --   --  18.0*  NEUTROABS  --   --   --   --  10.6*  --   --   --   --   --  15.1*  HGB 11.1*   < > 10.5*   < > 10.8*   < > 11.9* 10.6* 11.2* 10.9* 10.3*  HCT 34.5*   < > 33.3*   < > 33.1*   < > 35.0* 32.1* 33.0* 32.0* 31.3*  MCV 87.8  --  88.8  --  87.6  --   --  84.9  --   --  85.8  PLT 213  220  --  224  --  213  --   --  209  --   --  199   < > = values in this interval not displayed.    INR: Recent Labs  Lab 07/04/22 1340 07/04/22 1424 07/05/22 0401  07/06/22 0414  INR 1.3* 1.3*  NOT CALCULATED 1.1 1.5*    Other results: EKG:    Imaging   DG Chest Port 1 View  Result Date: 07/06/2022 CLINICAL DATA:  [id number]. Recent cardiac surgery. LVAD in place and chest tube. Check ETT. EXAM: PORTABLE CHEST 1 VIEW COMPARISON:  Portable chest yesterday at 5:31 a.m. FINDINGS: 5:33 a.m. ETT interval pullback to the level of T1-2, 7.2 cm from the carina previously 5.5 cm. NGT enters the stomach but the intragastric course is not filmed. Stable left chest dual lead pacing system, wire insertions and right IJ Swan-Ganz line. LVAD positioning is unaltered. Bilateral chest tubes and mediastinal drains are also unchanged and there is no visible pneumothorax. Patchy opacities are increased in the left-greater-than-right lower lung fields and could be atelectasis, pneumonia or combination. There is no substantial pleural effusion. The mediastinal configuration is stable. Mild cardiomegaly is seen with again noted mild central vascular prominence. A fracture of the superior surface of the posterior right first rib is again noted. IMPRESSION: 1. ETT interval pullback to the level of T1-2, 7.2 cm from the carina. 2. Other support devices are stable. 3. Increased patchy opacities in the left-greater-than-right lower lung fields could be atelectasis, pneumonia or combination. 4. Mild cardiomegaly and central vascular prominence. Electronically Signed   By: Telford Nab M.D.   On: 07/06/2022 07:48   DG Chest Port 1 View  Result Date: 07/05/2022 CLINICAL DATA:  Cardiac arrhythmia during surgery EXAM: PORTABLE CHEST 1 VIEW COMPARISON:  Radiograph 07/04/2022 FINDINGS: Endotracheal tube  overlies the trachea approximately 5.5 cm above the carina. Unchanged pulmonary artery catheter, chest tubes, and LVAD. Unchanged pacemaker/AICD leads. Unchanged appendage clip. Unchanged cardiomediastinal silhouette with prior median sternotomy. Mild interstitial opacities. Increased  bibasilar airspace opacities, likely atelectasis. Trace pleural effusions. No evidence of pneumothorax. Unchanged fractured posterior right first rib. IMPRESSION: Postoperative chest with increased bibasilar opacities, likely atelectasis. Mild interstitial edema and trace pleural effusions. Electronically Signed   By: Maurine Simmering M.D.   On: 07/05/2022 08:19   DG Abd Portable 1V  Result Date: 07/04/2022 CLINICAL DATA:  OG tube placement EXAM: PORTABLE ABDOMEN - 1 VIEW COMPARISON:  CT 06/28/2022 FINDINGS: Partially visualized LVAD. Esophageal tube tip in the left upper quadrant over proximal stomach. Probable lower mediastinal/chest drainage catheters. Some air-filled bowel in the right lower quadrant and pelvis without convincing evidence for an obstruction. IMPRESSION: Esophageal tube tip projects over the proximal stomach. Electronically Signed   By: Donavan Foil M.D.   On: 07/04/2022 16:25   DG Chest Port 1 View  Result Date: 07/04/2022 CLINICAL DATA:  Heart surgery EXAM: PORTABLE CHEST 1 VIEW COMPARISON:  06/28/2022 FINDINGS: Postop median sternotomy.  Clip on the left atrial appendage. Endotracheal tube 2 cm above the carina. Swan-Ganz catheter in the main pulmonary artery. AICD unchanged. Interval placement of left ventricular assist device in satisfactory position. NG tube in the stomach. Right chest tube. Pericardial drain. No pneumothorax Mild bibasilar atelectasis.  Negative for edema or effusion. IMPRESSION: 1. Postop sternotomy with support lines in good position. 2. Mild bibasilar atelectasis. Electronically Signed   By: Franchot Gallo M.D.   On: 07/04/2022 15:10     Medications:     Scheduled Medications:  sodium chloride   Intravenous Once   acetaminophen  1,000 mg Oral Q6H   Or   acetaminophen (TYLENOL) oral liquid 160 mg/5 mL  1,000 mg Per Tube Q6H   aspirin EC  325 mg Oral Daily   Or   aspirin  324 mg Per Tube Daily   Or   aspirin  300 mg Rectal Daily   atorvastatin  20 mg  Oral Daily   bisacodyl  10 mg Oral Daily   Or   bisacodyl  10 mg Rectal Daily   Chlorhexidine Gluconate Cloth  6 each Topical Daily   digoxin  0.125 mg Oral Daily   docusate  200 mg Per Tube Daily   feeding supplement (GLUCERNA SHAKE)  237 mL Oral QAC lunch   insulin aspart  0-24 Units Subcutaneous Q4H   insulin glargine-yfgn  30 Units Subcutaneous Daily   ipratropium-albuterol  3 mL Nebulization Q6H   levothyroxine  25 mcg Oral Q0600   magnesium oxide  400 mg Oral BID   multivitamin with minerals  1 tablet Oral Daily   mouth rinse  15 mL Mouth Rinse Q2H   pantoprazole  40 mg Oral Daily   sertraline  50 mg Oral Daily   sildenafil  20 mg Oral TID   sodium chloride flush  10-40 mL Intracatheter Q12H   sodium chloride flush  10-40 mL Intracatheter Q12H   sodium chloride flush  3 mL Intravenous Q12H   sodium chloride flush  3 mL Intravenous Q12H    Infusions:  sodium chloride Stopped (07/05/22 0554)   sodium chloride     sodium chloride     amiodarone 30 mg/hr (07/06/22 0752)    ceFAZolin (ANCEF) IV Stopped (07/06/22 0552)   dexmedetomidine (PRECEDEX) IV infusion 0.4 mcg/kg/hr (07/06/22 0700)   epinephrine 6  mcg/min (07/06/22 0700)   furosemide (LASIX) 200 mg in dextrose 5 % 100 mL (2 mg/mL) infusion     heparin 500 Units/hr (07/06/22 0700)   lactated ringers     lactated ringers Stopped (07/04/22 2207)   lactated ringers 20 mL/hr at 07/05/22 2132   milrinone 0.375 mcg/kg/min (07/06/22 0700)   norepinephrine (LEVOPHED) Adult infusion Stopped (07/06/22 0221)   phenylephrine (NEO-SYNEPHRINE) Adult infusion     vancomycin Stopped (07/05/22 2144)    PRN Medications: sodium chloride, acetaminophen, dextrose, lactated ringers, midazolam, morphine injection, ondansetron (ZOFRAN) IV, ondansetron (ZOFRAN) IV, mouth rinse, mouth rinse, oxyCODONE, sodium chloride flush, sodium chloride flush, sodium chloride flush, traMADol   Patient Profile   Admitted with A/C HFrEF --> cardiogenic  shock. S/P HMIII LVAD  Assessment/Plan:    1. Acute on chronic systolic CHF/cardiogenic shock: Nonischemic cardiomyopathy, diagnosed 2020.  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  Cath in 12/20 with no significant coronary disease.  Medtronic ICD. Most recent echo in 2/23 showed EF 20-25% with normal RV.  Recently, patient has been symptomatically worse, NYHA class IV with profound orthopnea. RHC showed markedly elevated filling pressures, primarily pulmonary venous hypertension, low cardiac output, and low PAPI.  Patient was admitted for inotrope and diuresis. Echo this admission with EF <20%, mild RV dysfunction.  He was started on milrinone with improvement in hemodynamics.  We proceeded with HM3 LVAD with LAA clip on 11/14.  This morning, he is on epinephrine 6, milrinone 0.375, sildenafil 20 tid.  CI 2.3, co-ox 54%, CVP 21. PAPI adequate at 1.7. Total bilirubin normal.  - Continue milrinone 0.375, decrease epinephrine to 5.  - Assess with echo this morning, can adjust LVAD speed based on echo.  - Had Lasix 40 mg IV x 1 earlier this morning, will start Lasix gtt 10 mg/hr.  - Continue digoxin 0.125 daily.  - Continue sildenafil 20 tid.  - Continue heparin gtt, will start warfarin when extubated.  2. Atrial fibrillation/flutter: S/p flutter ablation.  He mainly had atrial flutter.  He is in NSR today - Continue amiodarone gtt while on milrinone to try to keep him in rhythm.  3. H/o DVT: Will be anticoagulated.  4. Acute hypoxemic respiratory failure: Intubated currently but awake. Has baseline COPD and had prolonged intubation in 2021. He is volume overloaded and needs more fluid off prior to extubation.  - Aggressive diuresis, aim for SBT later today. 5. AKI: Creatinine mildly higher at 1.5, follow closely with  diuresis.  6. ID: Low grade fever and leukocytosis post-op.  Follow closely.    I reviewed the LVAD parameters from today, and compared the results to the patient's prior recorded data.  No programming changes were made.  The LVAD is functioning within specified parameters.  The patient performs LVAD self-test daily.  LVAD interrogation was negative for any significant power changes, alarms or PI events/speed drops.  LVAD equipment check completed and is in good working order.  Back-up equipment present.   LVAD education done on emergency procedures and precautions and reviewed exit site care.  CRITICAL CARE Performed by: Loralie Champagne  Total critical care time: 40 minutes  Critical care time was exclusive of separately billable procedures and treating other patients.  Critical care was necessary to treat or prevent imminent or life-threatening deterioration.  Critical care was  time spent personally by me on the following activities: development of treatment plan with patient and/or surrogate as well as nursing, discussions with consultants, evaluation of patient's response to treatment, examination of patient, obtaining history from patient or surrogate, ordering and performing treatments and interventions, ordering and review of laboratory studies, ordering and review of radiographic studies, pulse oximetry and re-evaluation of patient's condition.   Length of Stay: 41  Loralie Champagne, MD 07/06/2022, 7:54 AM  VAD Team --- VAD ISSUES ONLY--- Pager [phone] (7am - 7am)  Advanced Heart Failure Team  Pager [phone] (M-F; 7a - 5p)  Please contact [facility] for night-coverage after hours (5p -7a ) and weekends on [email or web address]

## 2022-07-06 NOTE — Progress Notes (Signed)
Patient's CVP has been steadily increasing from 15 to 23 over this RN's shift. TCTS on call surgeon notified. Received verbal orders from Dr. Darcey Nora to give 40 mg Lasix IV and to increase milrinone gtt to 0.375 mcg/kg/min.   Jill Poling, RN 07/06/2022 3:12 AM

## 2022-07-06 NOTE — Progress Notes (Signed)
PT Cancellation Note  Patient Details Name: Darryl Frye MRN: [id number] DOB: 09/02/1960   Cancelled Treatment:    Reason Eval/Treat Not Completed: Patient not medically ready. RN reporting they are preparing to hopefully extubate pt later this afternoon, but will likely not be until close to the end of this therapist's work day. Coordinated with RN for PT to follow-up tomorrow instead.   Moishe Spice, PT, DPT Acute Rehabilitation Services  Office: [place] 07/06/2022, 3:47 PM

## 2022-07-06 NOTE — Procedures (Signed)
Extubation Procedure Note  Patient Details:   Name: Darryl Frye DOB: 02/12/1961 MRN: [id number]   Airway Documentation:    Vent end date: 07/06/22 Vent end time: 1601   Evaluation  O2 sats: stable throughout Complications: No apparent complications Patient did tolerate procedure well. Bilateral Breath Sounds: Clear, Diminished   Pt extubated to 15L [place]. Pt had positive cuff leak prior to extubation. No stridor noted post extubation. Pt able to voice his name.  Vilinda Blanks 07/06/2022, 4:02 PM

## 2022-07-06 NOTE — Progress Notes (Signed)
LVAD Coordinator Rounding Note:  Admitted following RHC due to acute on chronic CHF and VAD eval to Dr Claris Gladden service.   HM 3 LVAD implanted on 07/04/22 by Dr Tenny Craw under destination criteria due to uncontrolled diabetes.  Pt remains intubated this morning. He is alert, following commands and currently weaning on the ventilator.  Speed increased to 5200 per Dr Aundra Dubin. Tolerating well. Bedside echo this morning septum midline speed remains at 5200.  Spoke with pt's caregiver [name]. Will plan for her to observe dressing change on Friday morning at 11:00.   Vital signs: Temp: 100 HR: 93 Doppler Pressure:78 Arterial BP: 83/71 (77) O2 Sat: 95% on FiO2 50% Wt: 214.3>208.8 lb  LVAD interrogation reveals:  Speed: 5200 Flow: 4.2 Power: 3.7 w PI: 3.6   Alarms: none Events:  none Hematocrit: 33  Fixed speed: 5200 Low speed limit: 4900   Drive Line: Existing VAD dressing removed and site care performed using sterile technique. Discussed suture placement with Dr Tenny Craw agreed to remove distal and medial blue suture to allow for proper external anchor placement. Drive line exit site cleaned with Chlora prep applicators x 2, allowed to dry, and gauze dressing with silver strip applied. Exit site unincorporated, the velour is fully implanted at exit site. 1 blue sutures, and 1 black suture in place. Slight bruising at exit site. Moderate amount of serosanguinous drainage on previous dressing. No redness, tenderness,  foul odor or rash noted. Drive line anchor re-applied. Continue daily dressing changes per VAD coordinator, nurse champion, or trained caregiver.    Labs:  LDH trend: 326>400  INR trend: 1.1>1.5  WBC trend: 12.6>18  Anticoagulation Plan: -INR Goal: 2.0 - 2.5 -ASA Dose: 325 mg until INR therapeutic   Blood Products:  IntraOp 11/14:  - 4 FFP  - DDAVP - 617 cell saver  PostOp 11/14: - 2 FFP  Device: -Medtronic -Therapies: OFF  Arrythmias: Hx a-flutter. NSR  today. Currently on Amio @ 30 mg  Respiratory: Failed wean x 2 yesterday due to tachypnea; currently weaning this morning  Renal:  -BUN/CRT: 18/1.23  Drips: Milrinone 0.375 mcg/kg/hr Amiodarone 30 mg/hr Epinephrine 7 mcg/min Lasix 10 mg/hr Precedex 0.6 mcg/kg/hr Insulin  Pt Education: Pt intubated. No family at bedside. Education inappropriate at this time.   Plan/Recommendations:  1. Page VAD coordinator for equipment or drive line issues. 2. Daily drive line dressing changes per VAD coordinator, nurse champion, or trained caregiver.  Bobbye Morton RN [facility] Coordinator  Office: [phone]  24/7 Pager: [phone]

## 2022-07-06 NOTE — Progress Notes (Signed)
Palliative:  Shadowing chart for needs. Still intubated with plans for further diuresis prior to extubation. Remains stable. Will follow along with progress and for support.   No charge  Vinie Sill, NP Palliative Medicine Team Pager [phone] (Please see [email or web address] for schedule) Team Phone [phone]

## 2022-07-07 ENCOUNTER — Inpatient Hospital Stay (HOSPITAL_COMMUNITY): Payer: Medicare Other

## 2022-07-07 ENCOUNTER — Encounter (HOSPITAL_COMMUNITY): Payer: Medicaid Other | Admitting: Cardiology

## 2022-07-07 DIAGNOSIS — I255 Ischemic cardiomyopathy: Secondary | ICD-10-CM | POA: Diagnosis not present

## 2022-07-07 DIAGNOSIS — I4891 Unspecified atrial fibrillation: Secondary | ICD-10-CM

## 2022-07-07 DIAGNOSIS — Z95811 Presence of heart assist device: Secondary | ICD-10-CM | POA: Diagnosis not present

## 2022-07-07 DIAGNOSIS — R570 Cardiogenic shock: Secondary | ICD-10-CM | POA: Diagnosis not present

## 2022-07-07 DIAGNOSIS — I361 Nonrheumatic tricuspid (valve) insufficiency: Secondary | ICD-10-CM

## 2022-07-07 LAB — COMPREHENSIVE METABOLIC PANEL
ALT: 21 U/L (ref 0–44)
AST: 57 U/L — ABNORMAL HIGH (ref 15–41)
Albumin: 2.9 g/dL — ABNORMAL LOW (ref 3.5–5.0)
Alkaline Phosphatase: 89 U/L (ref 38–126)
Anion gap: 14 (ref 5–15)
BUN: 28 mg/dL — ABNORMAL HIGH (ref 6–20)
CO2: 21 mmol/L — ABNORMAL LOW (ref 22–32)
Calcium: 8.4 mg/dL — ABNORMAL LOW (ref 8.9–10.3)
Chloride: 102 mmol/L (ref 98–111)
Creatinine, Ser: 1.37 mg/dL — ABNORMAL HIGH (ref 0.61–1.24)
GFR, Estimated: 59 mL/min — ABNORMAL LOW (ref >60)
Glucose, Bld: 206 mg/dL — ABNORMAL HIGH (ref 70–99)
Potassium: 3.8 mmol/L (ref 3.5–5.1)
Sodium: 137 mmol/L (ref 135–145)
Total Bilirubin: 0.7 mg/dL (ref 0.3–1.2)
Total Protein: 6.6 g/dL (ref 6.5–8.1)

## 2022-07-07 LAB — TYPE AND SCREEN
ABO/RH(D): O POS
Antibody Screen: NEGATIVE
Unit division: 0
Unit division: 0
Unit division: 0
Unit division: 0
Unit division: 0
Unit division: 0
Unit division: 0
Unit division: 0

## 2022-07-07 LAB — POCT I-STAT, CHEM 8
BUN: 27 mg/dL — ABNORMAL HIGH (ref 6–20)
Calcium, Ion: 1.12 mmol/L — ABNORMAL LOW (ref 1.15–1.40)
Chloride: 101 mmol/L (ref 98–111)
Creatinine, Ser: 1.3 mg/dL — ABNORMAL HIGH (ref 0.61–1.24)
Glucose, Bld: 189 mg/dL — ABNORMAL HIGH (ref 70–99)
HCT: 39 % (ref 39.0–52.0)
Hemoglobin: 13.3 g/dL (ref 13.0–17.0)
Potassium: 3.5 mmol/L (ref 3.5–5.1)
Sodium: 138 mmol/L (ref 135–145)
TCO2: 20 mmol/L — ABNORMAL LOW (ref 22–32)

## 2022-07-07 LAB — BASIC METABOLIC PANEL
Anion gap: 16 — ABNORMAL HIGH (ref 5–15)
Anion gap: 12 (ref 5–15)
BUN: 27 mg/dL — ABNORMAL HIGH (ref 6–20)
BUN: 30 mg/dL — ABNORMAL HIGH (ref 6–20)
CO2: 20 mmol/L — ABNORMAL LOW (ref 22–32)
CO2: 22 mmol/L (ref 22–32)
Calcium: 8.6 mg/dL — ABNORMAL LOW (ref 8.9–10.3)
Calcium: 8.3 mg/dL — ABNORMAL LOW (ref 8.9–10.3)
Chloride: 102 mmol/L (ref 98–111)
Chloride: 101 mmol/L (ref 98–111)
Creatinine, Ser: 1.47 mg/dL — ABNORMAL HIGH (ref 0.61–1.24)
Creatinine, Ser: 1.4 mg/dL — ABNORMAL HIGH (ref 0.61–1.24)
GFR, Estimated: 55 mL/min — ABNORMAL LOW (ref >60)
GFR, Estimated: 58 mL/min — ABNORMAL LOW (ref >60)
Glucose, Bld: 183 mg/dL — ABNORMAL HIGH (ref 70–99)
Glucose, Bld: 180 mg/dL — ABNORMAL HIGH (ref 70–99)
Potassium: 3.5 mmol/L (ref 3.5–5.1)
Potassium: 3.7 mmol/L (ref 3.5–5.1)
Sodium: 138 mmol/L (ref 135–145)
Sodium: 135 mmol/L (ref 135–145)

## 2022-07-07 LAB — HEPARIN LEVEL (UNFRACTIONATED)
Heparin Unfractionated: <0.1 IU/mL — ABNORMAL LOW (ref 0.30–0.70)
Heparin Unfractionated: <0.1 IU/mL — ABNORMAL LOW (ref 0.30–0.70)

## 2022-07-07 LAB — POCT I-STAT 7, (LYTES, BLD GAS, ICA,H+H)
Acid-base deficit: 2 mmol/L (ref 0.0–2.0)
Bicarbonate: 20.8 mmol/L (ref 20.0–28.0)
Calcium, Ion: 1.08 mmol/L — ABNORMAL LOW (ref 1.15–1.40)
HCT: 34 % — ABNORMAL LOW (ref 39.0–52.0)
Hemoglobin: 11.6 g/dL — ABNORMAL LOW (ref 13.0–17.0)
O2 Saturation: 98 %
Patient temperature: 37
Potassium: 3.4 mmol/L — ABNORMAL LOW (ref 3.5–5.1)
Sodium: 138 mmol/L (ref 135–145)
TCO2: 22 mmol/L (ref 22–32)
pCO2 arterial: 30.1 mmHg — ABNORMAL LOW (ref 32–48)
pH, Arterial: 7.448 (ref 7.35–7.45)
pO2, Arterial: 106 mmHg (ref 83–108)

## 2022-07-07 LAB — CBC WITH DIFFERENTIAL/PLATELET
Abs Immature Granulocytes: 0.19 10*3/uL — ABNORMAL HIGH (ref 0.00–0.07)
Basophils Absolute: 0.1 10*3/uL (ref 0.0–0.1)
Basophils Relative: 0 %
Eosinophils Absolute: 0 10*3/uL (ref 0.0–0.5)
Eosinophils Relative: 0 %
HCT: 33.1 % — ABNORMAL LOW (ref 39.0–52.0)
Hemoglobin: 11.4 g/dL — ABNORMAL LOW (ref 13.0–17.0)
Immature Granulocytes: 1 %
Lymphocytes Relative: 3 %
Lymphs Abs: 0.7 10*3/uL (ref 0.7–4.0)
MCH: 28.8 pg (ref 26.0–34.0)
MCHC: 34.4 g/dL (ref 30.0–36.0)
MCV: 83.6 fL (ref 80.0–100.0)
Monocytes Absolute: 1.5 10*3/uL — ABNORMAL HIGH (ref 0.1–1.0)
Monocytes Relative: 7 %
Neutro Abs: 18.6 10*3/uL — ABNORMAL HIGH (ref 1.7–7.7)
Neutrophils Relative %: 89 %
Platelets: 222 10*3/uL (ref 150–400)
RBC: 3.96 MIL/uL — ABNORMAL LOW (ref 4.22–5.81)
RDW: 15.4 % (ref 11.5–15.5)
WBC: 20.9 10*3/uL — ABNORMAL HIGH (ref 4.0–10.5)
nRBC: 0.1 % (ref 0.0–0.2)

## 2022-07-07 LAB — BPAM RBC
Blood Product Expiration Date: 202312052359
Blood Product Expiration Date: 202312052359
Blood Product Expiration Date: 202312052359
Blood Product Expiration Date: 202312052359
Blood Product Expiration Date: 202312052359
Blood Product Expiration Date: 202312062359
Blood Product Expiration Date: 202312062359
Blood Product Expiration Date: 202312062359
ISSUE DATE / TIME: 202311120425
ISSUE DATE / TIME: 202311130940
ISSUE DATE / TIME: 202311161759
ISSUE DATE / TIME: 202311161759
ISSUE DATE / TIME: 202311161759
ISSUE DATE / TIME: 202311161759
ISSUE DATE / TIME: 202311161759
Unit Type and Rh: 5100
Unit Type and Rh: 5100
Unit Type and Rh: 5100
Unit Type and Rh: 5100
Unit Type and Rh: 5100
Unit Type and Rh: 5100
Unit Type and Rh: 5100
Unit Type and Rh: 5100

## 2022-07-07 LAB — GLUCOSE, CAPILLARY
Glucose-Capillary: 209 mg/dL — ABNORMAL HIGH (ref 70–99)
Glucose-Capillary: 264 mg/dL — ABNORMAL HIGH (ref 70–99)
Glucose-Capillary: 201 mg/dL — ABNORMAL HIGH (ref 70–99)
Glucose-Capillary: 168 mg/dL — ABNORMAL HIGH (ref 70–99)
Glucose-Capillary: 129 mg/dL — ABNORMAL HIGH (ref 70–99)
Glucose-Capillary: 145 mg/dL — ABNORMAL HIGH (ref 70–99)

## 2022-07-07 LAB — APTT
aPTT: 40 seconds — ABNORMAL HIGH (ref 24–36)
aPTT: 37 seconds — ABNORMAL HIGH (ref 24–36)

## 2022-07-07 LAB — COOXEMETRY PANEL
Carboxyhemoglobin: 1.1 % (ref 0.5–1.5)
Carboxyhemoglobin: 0.6 % (ref 0.5–1.5)
Carboxyhemoglobin: 1.4 % (ref 0.5–1.5)
Methemoglobin: 1 % (ref 0.0–1.5)
Methemoglobin: 1.6 % — ABNORMAL HIGH (ref 0.0–1.5)
Methemoglobin: 1 % (ref 0.0–1.5)
O2 Saturation: 58.6 %
O2 Saturation: 97.7 %
O2 Saturation: 83.2 %
Total hemoglobin: 11.4 g/dL — ABNORMAL LOW (ref 12.0–16.0)
Total hemoglobin: 11.5 g/dL — ABNORMAL LOW (ref 12.0–16.0)
Total hemoglobin: 11.2 g/dL — ABNORMAL LOW (ref 12.0–16.0)

## 2022-07-07 LAB — PROTIME-INR
INR: 1.4 — ABNORMAL HIGH (ref 0.8–1.2)
Prothrombin Time: 17.2 seconds — ABNORMAL HIGH (ref 11.4–15.2)

## 2022-07-07 LAB — MAGNESIUM
Magnesium: 2.3 mg/dL (ref 1.7–2.4)
Magnesium: 2.2 mg/dL (ref 1.7–2.4)

## 2022-07-07 LAB — ECHOCARDIOGRAM LIMITED
Height: 71 in
Height: 71 in
Weight: 3340.41 oz
Weight: 3347.46 oz

## 2022-07-07 LAB — PHOSPHORUS: Phosphorus: 3.5 mg/dL (ref 2.5–4.6)

## 2022-07-07 LAB — LACTATE DEHYDROGENASE: LDH: 462 U/L — ABNORMAL HIGH (ref 98–192)

## 2022-07-07 MED ORDER — AMIODARONE LOAD VIA INFUSION
150.0000 mg | Freq: Once | INTRAVENOUS | Status: AC
  Administered 2022-07-07: 150 mg via INTRAVENOUS

## 2022-07-07 MED ORDER — POTASSIUM CHLORIDE CRYS ER 20 MEQ PO TBCR
40.0000 meq | EXTENDED_RELEASE_TABLET | Freq: Once | ORAL | Status: AC
  Administered 2022-07-07: 40 meq via ORAL
  Filled 2022-07-07: qty 2

## 2022-07-07 MED ORDER — ASPIRIN 81 MG PO CHEW
81.0000 mg | CHEWABLE_TABLET | Freq: Every day | ORAL | Status: DC
  Administered 2022-07-07 – 2022-07-21 (×15): 81 mg via ORAL
  Filled 2022-07-07 (×15): qty 1

## 2022-07-07 MED ORDER — VANCOMYCIN HCL IN DEXTROSE 1-5 GM/200ML-% IV SOLN
1000.0000 mg | Freq: Two times a day (BID) | INTRAVENOUS | Status: AC
  Administered 2022-07-07 – 2022-07-11 (×10): 1000 mg via INTRAVENOUS
  Filled 2022-07-07 (×10): qty 200

## 2022-07-07 MED ORDER — METOPROLOL TARTRATE 5 MG/5ML IV SOLN
5.0000 mg | Freq: Once | INTRAVENOUS | Status: AC
  Administered 2022-07-07: 5 mg via INTRAVENOUS

## 2022-07-07 MED ORDER — SODIUM CHLORIDE 0.9 % IV SOLN
2.0000 g | Freq: Three times a day (TID) | INTRAVENOUS | Status: AC
  Administered 2022-07-07 – 2022-07-11 (×15): 2 g via INTRAVENOUS
  Filled 2022-07-07 (×15): qty 12.5

## 2022-07-07 MED ORDER — POTASSIUM CHLORIDE 10 MEQ/50ML IV SOLN
INTRAVENOUS | Status: AC
  Filled 2022-07-07: qty 50

## 2022-07-07 MED ORDER — AMIODARONE LOAD VIA INFUSION
150.0000 mg | Freq: Once | INTRAVENOUS | Status: AC
  Administered 2022-07-07: 150 mg via INTRAVENOUS
  Filled 2022-07-07: qty 83.34

## 2022-07-07 MED ORDER — ORAL CARE MOUTH RINSE: 15.0000 mL | OROMUCOSAL | Status: DC | PRN

## 2022-07-07 MED ORDER — INSULIN GLARGINE-YFGN 100 UNIT/ML ~~LOC~~ SOLN
35.0000 [IU] | Freq: Every day | SUBCUTANEOUS | Status: DC
  Administered 2022-07-08 – 2022-07-10 (×3): 35 [IU] via SUBCUTANEOUS
  Filled 2022-07-07 (×4): qty 0.35

## 2022-07-07 MED ORDER — POLYETHYLENE GLYCOL 3350 17 G PO PACK
17.0000 g | PACK | Freq: Two times a day (BID) | ORAL | Status: DC
  Administered 2022-07-07 – 2022-07-21 (×21): 17 g via ORAL
  Filled 2022-07-07 (×22): qty 1

## 2022-07-07 MED ORDER — POTASSIUM CHLORIDE 10 MEQ/50ML IV SOLN
10.0000 meq | INTRAVENOUS | Status: AC
  Administered 2022-07-07 (×3): 10 meq via INTRAVENOUS
  Filled 2022-07-07 (×2): qty 50

## 2022-07-07 MED ORDER — ALPRAZOLAM 0.25 MG PO TABS
0.2500 mg | ORAL_TABLET | Freq: Three times a day (TID) | ORAL | Status: DC | PRN
  Administered 2022-07-07 (×2): 0.25 mg via ORAL
  Filled 2022-07-07 (×2): qty 1

## 2022-07-07 MED ORDER — POTASSIUM CHLORIDE 10 MEQ/50ML IV SOLN
10.0000 meq | INTRAVENOUS | Status: AC
  Administered 2022-07-07 (×3): 10 meq via INTRAVENOUS
  Filled 2022-07-07 (×3): qty 50

## 2022-07-07 MED ORDER — WARFARIN SODIUM 5 MG PO TABS
5.0000 mg | ORAL_TABLET | Freq: Once | ORAL | Status: AC
  Administered 2022-07-07: 5 mg via ORAL
  Filled 2022-07-07: qty 1

## 2022-07-07 MED ORDER — WARFARIN - PHYSICIAN DOSING INPATIENT
Freq: Every day | Status: DC
  Administered 2022-07-15 – 2022-07-16 (×2): 1

## 2022-07-07 MED ORDER — ENSURE ENLIVE PO LIQD
237.0000 mL | Freq: Three times a day (TID) | ORAL | Status: DC
  Administered 2022-07-07 – 2022-07-21 (×37): 237 mL via ORAL
  Filled 2022-07-07: qty 237

## 2022-07-07 MED ORDER — SENNOSIDES-DOCUSATE SODIUM 8.6-50 MG PO TABS
1.0000 | ORAL_TABLET | Freq: Two times a day (BID) | ORAL | Status: DC
  Administered 2022-07-07 – 2022-07-21 (×24): 1 via ORAL
  Filled 2022-07-07 (×26): qty 1

## 2022-07-07 MED FILL — Heparin Sodium (Porcine) Inj 1000 Unit/ML: Qty: 1000 | Status: AC

## 2022-07-07 MED FILL — Magnesium Sulfate Inj 50%: INTRAMUSCULAR | Qty: 10 | Status: AC

## 2022-07-07 MED FILL — Potassium Chloride Inj 2 mEq/ML: INTRAVENOUS | Qty: 40 | Status: AC

## 2022-07-07 MED FILL — Lidocaine HCl Local Preservative Free (PF) Inj 2%: INTRAMUSCULAR | Qty: 14 | Status: AC

## 2022-07-07 NOTE — Progress Notes (Signed)
ANTICOAGULATION CONSULT NOTE   Pharmacy Consult for heparin Indication:  LVAD  No Known Allergies  Patient Measurements: Height: 5\' 11"  (180.3 cm) Weight: 94.9 kg (209 lb 3.5 oz) (with system controller) IBW/kg (Calculated) : 75.3 Heparin Dosing Weight: 95 kg  Vital Signs: Temp: 99 F (37.2 C) (11/17 1745) Temp Source: Core (11/17 1600) BP: 103/70 (11/17 1200) Pulse Rate: 92 (11/17 1745)  Labs: Recent Labs    07/05/22 0401 07/05/22 0411 07/05/22 1738 07/05/22 1742 07/06/22 0414 07/06/22 1153 07/06/22 1519 07/06/22 1718 07/07/22 0030 07/07/22 0354 07/07/22 0510 07/07/22 1557  HGB 10.8*   < > 10.6*   < > 10.3*   < >  --    < > 13.3 11.4* 11.6*  --   HCT 33.1*   < > 32.1*   < > 31.3*   < >  --    < > 39.0 33.1* 34.0*  --   PLT 213  --  209  --  199  --   --   --   --  222  --   --   APTT  --   --   --    < > 37*  --  39*  --   --  40*  --  37*  LABPROT 14.1  --   --   --  18.0*  --   --   --   --  17.2*  --   --   INR 1.1  --   --   --  1.5*  --   --   --   --  1.4*  --   --   HEPARINUNFRC <0.10*  --   --    < > <0.10*  --  <0.10*  --   --  <0.10*  --  <0.10*  CREATININE 1.23  --  1.44*  --  1.51*   < > 1.51*   < > 1.30* 1.37*  --  1.40*   < > = values in this interval not displayed.     Estimated Creatinine Clearance: 66.8 mL/min (A) (by C-G formula based on SCr of 1.4 mg/dL (H)).   Medical History: Past Medical History:  Diagnosis Date   Acute kidney injury ([place])    Acute respiratory failure ([facility])    Atrial flutter ([facility])    on Eliquis   CHF (congestive heart failure) ([place])    EF 20-25% 10/15/21, AICD in place.   Coronary artery disease    Diabetes mellitus without complication ([facility])    DVT (deep venous thrombosis) ([place]) 06/2019   Heart murmur    Hypertension    Presence of permanent cardiac pacemaker    Renal disorder     Medications:  Scheduled:   sodium chloride   Intravenous Once   acetaminophen  1,000 mg Oral Q6H   Or   acetaminophen  (TYLENOL) oral liquid 160 mg/5 mL  1,000 mg Per Tube Q6H   aspirin  81 mg Oral Daily   atorvastatin  20 mg Oral Daily   bisacodyl  10 mg Oral Daily   Or   bisacodyl  10 mg Rectal Daily   Chlorhexidine Gluconate Cloth  6 each Topical Daily   digoxin  0.125 mg Oral Daily   feeding supplement  237 mL Oral TID BM   insulin aspart  0-24 Units Subcutaneous Q4H   [START ON 07/08/2022] insulin glargine-yfgn  35 Units Subcutaneous Daily   ipratropium-albuterol  3 mL Nebulization Q6H   levothyroxine  25 mcg Oral Q0600   lidocaine  2 patch Transdermal Q24H   magnesium oxide  400 mg Oral BID   melatonin  3 mg Oral QHS   multivitamin with minerals  1 tablet Oral Daily   pantoprazole  40 mg Oral Daily   polyethylene glycol  17 g Oral BID   potassium chloride  40 mEq Oral Once   senna-docusate  1 tablet Oral BID   sertraline  50 mg Oral Daily   sildenafil  20 mg Oral TID   sodium chloride flush  10-40 mL Intracatheter Q12H   sodium chloride flush  10-40 mL Intracatheter Q12H   sodium chloride flush  3 mL Intravenous Q12H   sodium chloride flush  3 mL Intravenous Q12H   Warfarin - Physician Dosing Inpatient   Does not apply q1600    Assessment: 25 yom admitted with HF now s/p HM3 LVAD with LAA clip on 11/14. On apixaban PTA for hx Aflutter (LD 11/6 PTA, now 11/9) - heparin level came back undetectable 11/15 so will not impact levels moving forward. Discussed with Dr. Tenny Craw. Will monitor aPTT and heparin level and slowly adjust to target lower end of therapeutic range (0.3 for heparin level, 65 for aPTT).   Hgb 11.4, PLTs 222 stable. LDH 462 trending up. No s/sx of bleeding or issues with infusion noted by RN this morning. Heparin level undetectable and aPTT 40 subtherapeutic on 700 units/hr. INR 1.4 this morning. Patient was extubated 11/16 PM. Discussed with Dr. Tenny Craw - will initiate warfarin today and will continue IV heparin until INR at goal. No s/sx bleeding or issues with heparin infusion per  RN.   PM f/u - heparin level and aPTT still below goals.  No overt bleeding or complications noted.  Goal of Therapy:  Heparin level 0.3-0.5 units/ml aPTT 65-85 seconds  INR 2-3 Monitor platelets by anticoagulation protocol: Yes   Plan:  Increase heparin infusion to 950 units/hr  Checking heparin level/aPTT @midnight  Monitor daily heparin level/aPTT, daily INR, CBC, and for s/sx of bleeding   Marguerite Olea, [facility] Clinical Pharmacist  07/07/2022 5:56 PM   [facility] pharmacy phone numbers are listed on [email or web address]

## 2022-07-07 NOTE — Progress Notes (Addendum)
Patient ID: Darryl Frye, male   DOB: 1961/08/04, 61 y.o.   MRN: [id number]   Advanced Heart Failure VAD Team Note  PCP-Cardiologist: Carlyle Dolly, MD   Subjective:    - 11/13 S/P HMIII L atrial appendage closure.  - 11/16 Extubated . GIven 40 mg IV and started on lasix drip 10 mg per/hr.  - 11/17 Developed A fib RVR. Given bolus 150 x 2. Unchanged.   On 80% high flow.   Currently Epi 5, milrinone 0.375 mcg, amio 30 mg   Brisk diuresis on lasix drip currently at 15 mg/hr. CVP coming down.   CVP 15  PA 43/12  CI 2.2  Co-ox 59% LDH 326 => 400 => 462  Sore with movement. Denies SOB.    LVAD INTERROGATION:  HeartMate III LVAD:   Flow 4.5 liters/min, speed 5200, power 4, PI 3.7  Rare PI events  Objective:    Vital Signs:   Temp:  [98.4 F (36.9 C)-100.6 F (38.1 C)] 98.6 F (37 C) (11/17 0630) Pulse Rate:  [92-186] 138 (11/17 0630) Resp:  [20-44] 28 (11/17 0630) BP: (83-130)/(67-90) 83/67 (11/17 0400) SpO2:  [86 %-100 %] 95 % (11/17 0630) Arterial Line BP: (68-156)/(54-87) 80/69 (11/17 0630) FiO2 (%):  [40 %-100 %] 80 % (11/17 0400) Weight:  [94.9 kg] 94.9 kg (11/17 0500) Last BM Date : 07/01/22 Mean arterial Pressure 70-80s   Intake/Output:   Intake/Output Summary (Last 24 hours) at 07/07/2022 0703 Last data filed at 07/07/2022 0600 Gross per 24 hour  Intake 2255.82 ml  Output 5845 ml  Net -3589.18 ml    CVP 11-12  Physical Exam   Physical Exam: GENERAL: No acute distress. HEENT: normal  NECK: Supple, JVP to jaw  .  2+ bilaterally, no bruits.  No lymphadenopathy or thyromegaly appreciated.  RIJ swan CARDIAC:  Mechanical heart sounds with LVAD hum present. CT x2 MT x2 LUNGS:  Clear to auscultation bilaterally. On high flow [place] ABDOMEN:  Soft, round, distended, positive bowel sounds x4.     LVAD exit site:.  Dressing dry and intact.  No erythema or drainage.  Stabilization device present and accurately applied EXTREMITIES:  Warm and dry, no cyanosis,  clubbing, rash or edema  NEUROLOGIC:  Alert and oriented x 3.    No aphasia.  No dysarthria.  Affect pleasant.      Telemetry    A fib RVR 130s personally checked   EKG    N/A  Labs   Basic Metabolic Panel: Recent Labs  Lab 07/05/22 0401 07/05/22 0411 07/05/22 1738 07/05/22 1742 07/06/22 0414 07/06/22 1153 07/06/22 1518 07/06/22 1519 07/06/22 1718 07/06/22 1750 07/07/22 0029 07/07/22 0030 07/07/22 0354 07/07/22 0510  NA 136   < > 135   < > 138 140   < > 139   < > 138 138 138 137 138  K 5.3*   < > 5.1   < > 4.3 4.2   < > 3.8   < > 3.6 3.5 3.5 3.8 3.4*  CL 107  --  105  --  109 110  --  106  --   --  102 101 102  --   CO2 21*  --  18*  --  21* 20*  --  20*  --   --  20*  --  21*  --   GLUCOSE 93  --  188*  --  150* 153*  --  174*  --   --  183* 189* 206*  --  BUN 18  --  20  --  24* 26*  --  26*  --   --  27* 27* 28*  --   CREATININE 1.23  --  1.44*  --  1.51* 1.46*  --  1.51*  --   --  1.47* 1.30* 1.37*  --   CALCIUM 8.0*  --  8.2*  --  8.3* 8.4*  --  8.3*  --   --  8.6*  --  8.4*  --   MG 2.7*  --  2.7*  --  2.7* 2.6*  --  2.4  --   --   --   --  2.3  --   PHOS 2.7  --   --   --  3.4  --   --   --   --   --   --   --  3.5  --    < > = values in this interval not displayed.    Liver Function Tests: Recent Labs  Lab 07/05/22 0401 07/06/22 0414 07/07/22 0354  AST 90* 102* 57*  ALT 25 27 21   ALKPHOS 70 74 89  BILITOT 0.8 0.6 0.7  PROT 6.5 6.4* 6.6  ALBUMIN 3.6 3.1* 2.9*   No results for input(s): "LIPASE", "AMYLASE" in the last 168 hours. No results for input(s): "AMMONIA" in the last 168 hours.  CBC: Recent Labs  Lab 07/04/22 1955 07/04/22 2003 07/05/22 0401 07/05/22 0411 07/05/22 1738 07/05/22 1742 07/06/22 0414 07/06/22 1518 07/06/22 1718 07/06/22 1750 07/07/22 0030 07/07/22 0354 07/07/22 0510  WBC 11.6*  --  12.6*  --  16.5*  --  18.0*  --   --   --   --  20.9*  --   NEUTROABS  --   --  10.6*  --   --   --  15.1*  --   --   --   --  18.6*   --   HGB 10.5*   < > 10.8*   < > 10.6*   < > 10.3*   < > 11.2* 11.2* 13.3 11.4* 11.6*  HCT 33.3*   < > 33.1*   < > 32.1*   < > 31.3*   < > 33.0* 33.0* 39.0 33.1* 34.0*  MCV 88.8  --  87.6  --  84.9  --  85.8  --   --   --   --  83.6  --   PLT 224  --  213  --  209  --  199  --   --   --   --  222  --    < > = values in this interval not displayed.    INR: Recent Labs  Lab 07/04/22 1340 07/04/22 1424 07/05/22 0401 07/06/22 0414 07/07/22 0354  INR 1.3* 1.3*  NOT CALCULATED 1.1 1.5* 1.4*    Other results: EKG:    Imaging   DG CHEST PORT 1 VIEW  Result Date: 07/06/2022 CLINICAL DATA:  Hypoxia EXAM: PORTABLE CHEST 1 VIEW COMPARISON:  07/06/2022 FINDINGS: Left pacer and LVAD remain in place, unchanged. Swan-Ganz catheter tip is in the main pulmonary artery, stable. Endotracheal tube and NG tube have been removed. Mild cardiomegaly with vascular congestion. Bilateral perihilar and lower lobe opacities may reflect edema, atelectasis or pneumonia, or a combination. This is increased since prior study. No visible effusions. IMPRESSION: Increasing perihilar and lower lobe airspace opacities could reflect edema, atelectasis, pneumonia, or a combination. Electronically Signed   By: Lennette Bihari  [place] M.D.   On: 07/06/2022 19:54   [place] EKG SITE RITE  Result Date: 07/06/2022 If Site Rite image not attached, placement could not be confirmed due to current cardiac rhythm.  DG Chest Port 1 View  Result Date: 07/06/2022 CLINICAL DATA:  [id number]. Recent cardiac surgery. LVAD in place and chest tube. Check ETT. EXAM: PORTABLE CHEST 1 VIEW COMPARISON:  Portable chest yesterday at 5:31 a.m. FINDINGS: 5:33 a.m. ETT interval pullback to the level of T1-2, 7.2 cm from the carina previously 5.5 cm. NGT enters the stomach but the intragastric course is not filmed. Stable left chest dual lead pacing system, wire insertions and right IJ Swan-Ganz line. LVAD positioning is unaltered. Bilateral chest tubes and  mediastinal drains are also unchanged and there is no visible pneumothorax. Patchy opacities are increased in the left-greater-than-right lower lung fields and could be atelectasis, pneumonia or combination. There is no substantial pleural effusion. The mediastinal configuration is stable. Mild cardiomegaly is seen with again noted mild central vascular prominence. A fracture of the superior surface of the posterior right first rib is again noted. IMPRESSION: 1. ETT interval pullback to the level of T1-2, 7.2 cm from the carina. 2. Other support devices are stable. 3. Increased patchy opacities in the left-greater-than-right lower lung fields could be atelectasis, pneumonia or combination. 4. Mild cardiomegaly and central vascular prominence. Electronically Signed   By: Telford Nab M.D.   On: 07/06/2022 07:48     Medications:     Scheduled Medications:  sodium chloride   Intravenous Once   acetaminophen  1,000 mg Oral Q6H   Or   acetaminophen (TYLENOL) oral liquid 160 mg/5 mL  1,000 mg Per Tube Q6H   aspirin EC  325 mg Oral Daily   Or   aspirin  324 mg Per Tube Daily   Or   aspirin  300 mg Rectal Daily   atorvastatin  20 mg Oral Daily   bisacodyl  10 mg Oral Daily   Or   bisacodyl  10 mg Rectal Daily   Chlorhexidine Gluconate Cloth  6 each Topical Daily   digoxin  0.125 mg Oral Daily   docusate  200 mg Per Tube Daily   feeding supplement (GLUCERNA SHAKE)  237 mL Oral QAC lunch   insulin aspart  0-24 Units Subcutaneous Q4H   insulin glargine-yfgn  30 Units Subcutaneous Daily   ipratropium-albuterol  3 mL Nebulization Q6H   levothyroxine  25 mcg Oral Q0600   lidocaine  2 patch Transdermal Q24H   magnesium oxide  400 mg Oral BID   melatonin  3 mg Oral QHS   multivitamin with minerals  1 tablet Oral Daily   pantoprazole  40 mg Oral Daily   sertraline  50 mg Oral Daily   sildenafil  20 mg Oral TID   sodium chloride flush  10-40 mL Intracatheter Q12H   sodium chloride flush  10-40 mL  Intracatheter Q12H   sodium chloride flush  3 mL Intravenous Q12H   sodium chloride flush  3 mL Intravenous Q12H    Infusions:  sodium chloride Stopped (07/05/22 0554)   sodium chloride     sodium chloride     amiodarone 30 mg/hr (07/07/22 0527)   epinephrine 5 mcg/min (07/07/22 0400)   furosemide (LASIX) 200 mg in dextrose 5 % 100 mL (2 mg/mL) infusion 15 mg/hr (07/07/22 0400)   heparin 700 Units/hr (07/07/22 0400)   lactated ringers     lactated ringers Stopped (07/04/22 2207)  lactated ringers 20 mL/hr at 07/05/22 2132   milrinone 0.375 mcg/kg/min (07/07/22 0400)   norepinephrine (LEVOPHED) Adult infusion Stopped (07/06/22 0221)   phenylephrine (NEO-SYNEPHRINE) Adult infusion      PRN Medications: sodium chloride, acetaminophen, ALPRAZolam, dextrose, hydrALAZINE, lactated ringers, midazolam, morphine injection, ondansetron (ZOFRAN) IV, ondansetron (ZOFRAN) IV, mouth rinse, oxyCODONE, sodium chloride flush, sodium chloride flush, sodium chloride flush, traMADol   Patient Profile   Admitted with A/C HFrEF --> cardiogenic shock. S/P HMIII LVAD  Assessment/Plan:    1. Acute on chronic systolic CHF/cardiogenic shock: Nonischemic cardiomyopathy, diagnosed 2020.  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  Cath in 12/20 with no significant coronary disease.  [employer] ICD. Most recent echo in 2/23 showed EF 20-25% with normal RV.  Recently, patient has been symptomatically worse, NYHA class IV with profound orthopnea. RHC showed markedly elevated filling pressures, primarily pulmonary venous hypertension, low cardiac output, and low PAPI.  Patient was admitted for inotrope and diuresis. Echo this admission with EF <20%, mild RV dysfunction.  He was started on milrinone with improvement in hemodynamics.  We  proceeded with HM3 LVAD with LAA clip on 11/14.  This morning, he is on epinephrine 5, milrinone 0.375, sildenafil 20 tid.  CI 2.17, co-ox 59% Total bilirubin normal.  - Cut back milrinone 0.25 mcg.  Continue epinephrine to 5.  - CVP coming down. Continue lasix drip 10 mg per hour.  - Continue digoxin 0.125 daily.  - Continue sildenafil 20 tid.  - Continue heparin gtt, will start warfarin per CT surgery.   2. A fib RVR : H/O S/p flutter ablation.   - Went in A fib RVR over night. Persistent despite bolus x2 - Given 150 mg bolus and increase amio to 60 mg per hour.  3. H/o DVT: Will be anticoagulated.  4. Acute hypoxemic respiratory failure: Intubated currently but awake. Has baseline COPD and had prolonged intubation in 2021.  - 11/16 Extubated - Still requiring high flow at 80%. . 5. AKI: Creatinine stable at 1.47 , follow closely with diuresis.  6. ID: Low grade fever and leukocytosis post-op.  T 100.6 Follow closely.   WBC trending up 20 7. Hypothyroidsim On levothyroxine.   PICC line has been ordered.   I reviewed the LVAD parameters from today, and compared the results to the patient's prior recorded data.  No programming changes were made.  The LVAD is functioning within specified parameters.  The patient performs LVAD self-test daily.  LVAD interrogation was negative for any significant power changes, alarms or PI events/speed drops.  LVAD equipment check completed and is in good working order.  Back-up equipment present.   LVAD education done on emergency procedures and precautions and reviewed exit site care.   Length of Stay: [place], NP 07/07/2022, 7:03 AM  VAD Team --- VAD ISSUES ONLY--- Pager [phone] (7am - 7am)  Advanced Heart Failure Team  Pager [phone] (M-F; 7a - 5p)  Please contact [facility] for night-coverage after hours (5p -7a ) and weekends on [email or web address]  Patient seen with NP, agree with the above note.   Patient extubated yesterday, oxygen  saturation 99% on 80% HFNC.  CXR with bilateral lower lobe infiltrates.  He diuresed very well yesterday with Lasix gtt up to 15 mg/hr, CVP down to 15 today.  He remains on epinephrine 5 and milrinone 0.375 + sildenafil 20 tid with stable MAP and creatinine 1.37.  Co-ox 59%, CI 2.2.   He is on heparin gtt, INR 1.4.   Atrial fibrillation with RVR began last night, he has been on amiodarone gtt.   Reports sore chest, otherwise doing ok. No BM.   General: Well appearing this am. NAD.  HEENT: Normal. Neck: Supple, JVP 14 cm. Carotids OK.  Cardiac:  Mechanical heart sounds with LVAD hum present.  Lungs:  Crackles at bases.  Abdomen:  NT, moderate distention, no HSM. No bruits or masses. +BS  LVAD exit site: Well-healed and incorporated. Dressing dry and intact. No erythema or drainage. Stabilization device present and accurately applied. Driveline dressing changed daily per sterile technique. Extremities:  Warm and dry. No cyanosis, clubbing, rash, or edema.  Neuro:  Alert & oriented x 3. Cranial nerves grossly intact. Moves all 4 extremities w/o difficulty. Affect pleasant    Bolus amiodarone, increase to 60 mg/hr, and will replace K. Will try to slowly come down on epinephrine dose.  If we cannot control AF, may require cardioversion.  I do not think his RV will tolerate rapid AF well.   Continue heparin gtt, will add warfarin today.   Oxygen requirement in setting of ongoing volume overload (improving), atelectasis and possible aspiration.   - Continue aggressive diuresis with stable creatinine and CVP 15, use Lasix 15 mg/hr.  - Incentive spirometry - Nebs - Adding empiric antibiotics with vancomycin/cefepime in setting of rising WBCs to 21 and low grade fevers periodically.  - Sputum culture.   I will decrease milrinone to 0.25 and epinephrine to 4 this morning.  Discussed with Dr. Tenny Craw, will decrease LVAD speed to 5100 today as aortic valve not opening at 5200 rpm.  Flow remained  stable at 4.2-4.3 L/min. Reassess by limited echo today => aortic valve opens every 5-6 beats at 5100 rpm, septum looks midline, RV not significantly enlarged but function appears worse.  Images are difficult.   Needs bowel regimen.   Mobilize.   CRITICAL CARE Performed by: Loralie Champagne  Total critical care time: 40 minutes  Critical care time was exclusive of separately billable procedures and treating other patients.  Critical care was necessary to treat or prevent imminent or life-threatening deterioration.  Critical care was time spent personally by me on the following activities: development of treatment plan with patient and/or surrogate as well as nursing, discussions with consultants, evaluation of patient's response to treatment, examination of patient, obtaining history from patient or surrogate, ordering and performing treatments and interventions, ordering and review of laboratory studies, ordering and review of radiographic studies, pulse oximetry and re-evaluation of patient's condition.  Loralie Champagne 07/07/2022 7:54 AM  HR still elevated in 120s this afternoon, bounces to 130s when he gets anxious (PICC placement).  Bolus with amiodarone and decrease epinephrine to 3.   Keep NPO at midnight, consider DCCV in am if rate uncontrolled.  Discussed with Dr. Tenny Craw.  If DCCV needs to be done, will need meticulous management of airway by CCM.   Loralie Champagne 07/07/2022 4:05 PM

## 2022-07-07 NOTE — Progress Notes (Signed)
EVENING ROUNDS NOTE :     [address]       [place] [address]             [phone]                 3 Days Post-Op Procedure(s) (LRB): INSERTION OF IMPLANTABLE LEFT VENTRICULAR ASSIST DEVICE; ATRICLIP 45 (N/A) TRANSESOPHAGEAL ECHOCARDIOGRAM (TEE) (N/A)   Total Length of Stay:  LOS: 10 days  Events:   Afib HD stable    BP 103/70   Pulse (!) 156   Temp 99 F (37.2 C)   Resp (!) 33   Ht 5\' 11"  (1.803 m)   Wt 94.9 kg Comment: with system controller  SpO2 94%   BMI 29.18 kg/m   PAP: (29-58)/(16-39) 49/34 CVP:  [0 mmHg-29 mmHg] 26 mmHg CO:  [4.6 L/min-5.9 L/min] 4.9 L/min CI:  [2.2 L/min/m2-2.8 L/min/m2] 2.3 L/min/m2  FiO2 (%):  [60 %-100 %] 70 %   sodium chloride Stopped (07/05/22 0554)   sodium chloride     sodium chloride     amiodarone 60 mg/hr (07/07/22 1554)   ceFEPime (MAXIPIME) IV Stopped (07/07/22 1451)   epinephrine 4 mcg/min (07/07/22 1500)   furosemide (LASIX) 200 mg in dextrose 5 % 100 mL (2 mg/mL) infusion 15 mg/hr (07/07/22 1500)   heparin 800 Units/hr (07/07/22 1500)   lactated ringers     lactated ringers Stopped (07/04/22 2207)   lactated ringers 20 mL/hr at 07/05/22 2132   milrinone 0.25 mcg/kg/min (07/07/22 1500)   norepinephrine (LEVOPHED) Adult infusion Stopped (07/06/22 0221)   vancomycin Stopped (07/07/22 1205)    I/O last 3 completed shifts: In: 3564 [I.V.:2360.5; NG/GT:100; IV Piggyback:1103.6] Out: 3295 [Urine:7425; Chest Tube:930]      Latest Ref Rng & Units 07/07/2022    5:10 AM 07/07/2022    3:54 AM 07/07/2022   12:30 AM  CBC  WBC 4.0 - 10.5 K/uL  20.9    Hemoglobin 13.0 - 17.0 g/dL 11.6  11.4  13.3   Hematocrit 39.0 - 52.0 % 34.0  33.1  39.0   Platelets 150 - 400 K/uL  222         Latest Ref Rng & Units 07/07/2022    3:57 PM 07/07/2022    5:10 AM 07/07/2022    3:54 AM  BMP  Glucose 70 - 99 mg/dL 180   206   BUN 6 - 20 mg/dL 30   28   Creatinine 0.61 - 1.24 mg/dL 1.40   1.37   Sodium 135 -  145 mmol/L 135  138  137   Potassium 3.5 - 5.1 mmol/L 3.7  3.4  3.8   Chloride 98 - 111 mmol/L 101   102   CO2 22 - 32 mmol/L 22   21   Calcium 8.9 - 10.3 mg/dL 8.3   8.4     ABG    Component Value Date/Time   PHART 7.448 07/07/2022 0510   PCO2ART 30.1 (L) 07/07/2022 0510   PO2ART 106 07/07/2022 0510   HCO3 20.8 07/07/2022 0510   TCO2 22 07/07/2022 0510   ACIDBASEDEF 2.0 07/07/2022 0510   O2SAT 97.7 07/07/2022 1556       Latishia Suitt, MD 07/07/2022 5:02 PM

## 2022-07-07 NOTE — Progress Notes (Signed)
Peripherally Inserted Central Catheter Placement  The IV Nurse has discussed with the patient and/or persons authorized to consent for the patient, the purpose of this procedure and the potential benefits and risks involved with this procedure.  The benefits include less needle sticks, lab draws from the catheter, and the patient may be discharged home with the catheter. Risks include, but not limited to, infection, bleeding, blood clot (thrombus formation), and puncture of an artery; nerve damage and irregular heartbeat and possibility to perform a PICC exchange if needed/ordered by physician.  Alternatives to this procedure were also discussed.  Bard Power PICC patient education guide, fact sheet on infection prevention and patient information card has been provided to patient /or left at bedside.    PICC Placement Documentation  PICC Double Lumen 07/07/22 Right Brachial 43 cm 0 cm (Active)  Indication for Insertion or Continuance of Line Vasoactive infusions 07/07/22 1555  Exposed Catheter (cm) 0 cm 07/07/22 1555  Site Assessment Clean, Dry, Intact 07/07/22 1555  Lumen #1 Status Flushed;Saline locked;Blood return noted 07/07/22 1555  Lumen #2 Status Flushed;Saline locked;Blood return noted 07/07/22 1555  Dressing Type Transparent;Securing device 07/07/22 1555  Dressing Status Antimicrobial disc in place;Clean, Dry, Intact 07/07/22 1555  Safety Lock Not Applicable 81/18/86 7737  Line Care Connections checked and tightened 07/07/22 1555  Line Adjustment (NICU/IV Team Only) No 07/07/22 1555  Dressing Intervention New dressing 07/07/22 1555  Dressing Change Due 07/14/22 07/07/22 [place] 07/07/2022, 3:57 PM

## 2022-07-07 NOTE — Progress Notes (Signed)
Nutrition Follow-up  DOCUMENTATION CODES:   Not applicable  INTERVENTION:   Ensure Enlive po TID, each supplement provides 350 kcal and 20 grams of protein.  D/C Glucerna for now  If po intake remains inadequate, recommend liberalizing diet to REGULAR  NUTRITION DIAGNOSIS:   Increased nutrient needs related to catabolic illness, post-op healing as evidenced by estimated needs.  Being addressed via supplements  GOAL:   Patient will meet greater than or equal to 90% of their needs  Progressing  MONITOR:   PO intake, Supplement acceptance, Labs, Weight trends, Skin  REASON FOR ASSESSMENT:   Consult LVAD Eval  ASSESSMENT:   61 yo male admitted with acute on chronic CHF, now being evaluated for possible LVAD. Last ECHO 2/23 with EF 20-25% with EF <10% in 11/20 with initiatial dx. PMH includes nonischemic CM, a.fib/flutter, DM, CAD, HTN, DVT. 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  11/14 LVAD HM3 11/16 Extubated  Pt currently on HFNC, reports he feels "ok"   Pt reports appetite is not as good as it was pre-op, RD encouraged pt by stating this is to be expected.  Pt just drank liquids at breakfast today, some soda and juice. Pt agreeable to increasing oral nutrition supplement frequency to TID, plan to change to Ensure Enlive for now for add calories and much more protein in same volume.  Current wt 94.9 kg. Weight up post op as expected but weight trending back down with diuresis. +mild edema on exam  +constipation with last BM on 11/11. Noted dulcolax daily, senna-docusate and miralax scheduled today  Labs: reviewed Meds: lasix gtt, ss novolog, semglee, MVI  Diet Order:   Diet Order             Diet NPO time specified  Diet effective midnight           Diet Carb Modified Fluid consistency: Thin; Room service appropriate? Yes  Diet effective now                   EDUCATION NEEDS:   Education needs have been  addressed  Skin:  Skin Assessment: Reviewed RN Assessment  Last BM:  11/6  Height:   Ht Readings from Last 1 Encounters:  07/05/22 5\' 11"  (1.803 m)    Weight:   Wt Readings from Last 1 Encounters:  07/07/22 94.9 kg    BMI:  Body mass index is 29.18 kg/m.  Estimated Nutritional Needs:   Kcal:  2100-2300 kcals  Protein:  110-125 g  Fluid:  1.8 L   Kerman Passey MS, RDN, LDN, CNSC Registered Dietitian 3 Clinical Nutrition RD Pager and On-Call Pager Number Located in [place]

## 2022-07-07 NOTE — Progress Notes (Signed)
Pt does not want to do the metaneb at this time, states he did it earlier and wants to rest for the night. RT will try later.

## 2022-07-07 NOTE — Progress Notes (Addendum)
PT Cancellation Note  Patient Details Name: Darryl Frye MRN: [id number] DOB: Dec 19, 1960   Cancelled Treatment:    Reason Eval/Treat Not Completed: Patient declined, no reason specified. Pt requesting PT return later due to having "just got my breathing right". Will plan to follow-up later as time permits.   Addendum 15:25 - Pt now getting PICC line placed. Will plan to attempt again later if time permits.   Addendum 16:43 - Attempted to perform PT Eval for 3rd time today, but pt reporting he is too fatigued to try to sit EOB or stand due to having a "busy day". Pt requesting to hold off on PT Eval today. Will plan to follow-up tomorrow as able.    Moishe Spice, PT, DPT Acute Rehabilitation Services  Office: [place] 07/07/2022, 12:27 PM

## 2022-07-07 NOTE — Progress Notes (Addendum)
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 Presented 11/14 for LVAD after failing out-pt medical therapies.   Initial flow 4.5 l/min speed 5100 power 3.6 P1 2.3  Pertinent  Medical History  Afib/flutter s/p ablation May 2023 NICM EF 20-25% has ICD Remote h/o heavy ETOH and cocaine Class IV NYHF sxs.   Significant Hospital Events: Including procedures, antibiotic start and stop dates in addition to other pertinent events   11/14 LVAD placed. Returned to ICU full vent support. On iNO 20ppm, Returned to ICU post-op intubated on Neo, epi 6, and amio gtt. Initial hemodynamics PA 34/20 CO 4.9 CI 2.4 co ox 70% l CVP 10-. On full vent support. PCCM asked to assist w/ care  11/15 iNO weaned off. Got lasix. Pump speed increased to 5200. Still on epi and milrione. Awake following commands 11/15 attempted SBT but saturations dropped 11/16 extubated  11/17 mobilizations, remains on pressors   Interim History / Subjective:   Remains critically ill, on pressors postop. LVAD  Objective   Blood pressure (!) 83/67, pulse (!) 134, temperature 98.8 F (37.1 C), resp. rate (!) 28, height 5\' 11"  (1.803 m), weight 94.9 kg, SpO2 97 %. PAP: (28-58)/(14-32) 33/21 CVP:  [0 mmHg-21 mmHg] 12 mmHg CO:  [4.6 L/min-5.9 L/min] 4.6 L/min CI:  [2.2 L/min/m2-2.8 L/min/m2] 2.2 L/min/m2  Vent Mode: PSV;CPAP FiO2 (%):  [40 %-100 %] 70 % Set Rate:  [20 bmp] 20 bmp Vt Set:  [600 mL] 600 mL PEEP:  [5 cmH20] 5 cmH20 Pressure Support:  [5 cmH20-10 cmH20] 5 cmH20   Intake/Output Summary (Last 24 hours) at 07/07/2022 0948 Last data filed at 07/07/2022 0800 Gross per 24 hour  Intake 2805.34 ml  Output 5825 ml  Net -3019.66 ml   Filed Weights   07/05/22 0501 07/06/22 0500  07/07/22 0500  Weight: 97.2 kg 94.7 kg 94.9 kg   Examination: General: middle aged male, resting in bed  HENT NCAT, tracking, IJ lines in place  Heart: +HUM from VAD, midline sternal incision  Pulm: diminshed breath sounds in the bases  Abd: soft, NT ND  Ext: warm dry  Neuro: awake, alert, following commands  GU: clear urine, in foley bag   Ancillary tests personally reviewed:    Cr 1.37 Na 137 K 3.4, will recheck later today   Assessment & Plan:   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 s/p LVAD HM3 Plan Remains on epi and milrinone Epi decreased today, remains on low dose milrinone  On sildenafil and digoxin  Another Amio bolus this AM for afib   Post-operative coagulapathy  Plan Monitor and correct as indicated  Acute Hypoxemic Respiratory Failure  S/p extubation  P; Likely has severe basilar atelectasis and hypoventilation from chest surgery and drains in place  Continue mobilization  IS and flutter are important  Hopefully up in a chair as soon as possible  Decreased HHFNC to 60% 35L   H/o afib/flutter s/p prior ablation -previously on DOAC, currently in NSR Cont tele P: Was given another amio bolus this AM  Transitioning to coumadin from heparin   H/o DVT Bridging to coumadin from heparin   Best Practice (right click and "Reselect all SmartList Selections" daily)   Diet/type: NPO  Consider tube feeds if not extubated tomorrow.  DVT prophylaxis: SCD - fixed dose heparin, start warfarin once extubated.  GI prophylaxis: H2B Lines: Central line Foley:  Yes, and it is still needed Code Status:  limited Last date of multidisciplinary goals of care discussion [NA]   This patient is critically ill with multiple organ system failure; which, requires frequent high complexity decision making, assessment, support, evaluation, and titration of therapies. This was completed through the application of advanced monitoring technologies and  extensive interpretation of multiple databases. During this encounter critical care time was devoted to patient care services described in this note for 34 minutes.  Garner Nash, DO [facility] 07/07/2022 9:48 AM

## 2022-07-07 NOTE — Progress Notes (Signed)
LVAD Coordinator Rounding Note:  Admitted following [facility] due to acute on chronic CHF and VAD eval to Dr Claris Gladden service.   HM 3 LVAD implanted on 07/04/22 by Dr Tenny Craw under destination criteria due to uncontrolled diabetes. Left atrial appendage clipped at time of surgery.  Pt extubated yesterday afternoon to high flow nasal canula. He is alert, following commands. Reports chest soreness 3/10, but with coughing 9/10.    Went into a-fib overnight. Rate 130s-150s. Has received 4 Amiodarone boluses.   WBC 20.9. Receiving Vancomycin and Cefazolin.   Speed decreased to 5100 per Dr Tenny Craw. Tolerating well.   Will plan to do further VAD education Monday afternoon at 1500 with [name] and Ruth.   Vital signs: Temp: 99.0 HR: 137 afib Doppler Pressure: Arterial BP: 99/68 (78) O2 Sat: 93% on 70% HiFo Wt: 214.3>208.8>209.2 lb  LVAD interrogation reveals:  Speed: 5200 Flow: 4.2 Power: 3.6 w PI: 4.2   Alarms: none Events: rare Hematocrit: 33  Fixed speed: 5200 Low speed limit: 4900   Drive Line: Dressing change observed by pt's caregivers Jessica and [employer]. Existing VAD dressing removed and site care performed using sterile technique. Drive line exit site cleaned with Chlora prep applicators x 2, allowed to dry, and gauze dressing with silver strip applied. Exit site unincorporated, the velour is fully implanted at exit site. 1 blue sutures, and 1 black suture in place. Slight bruising at exit site. Small amount of sanguinous drainage on previous dressing/silver strip. No redness, tenderness,  foul odor or rash noted. Drive line anchor re-applied. Continue daily dressing changes per VAD coordinator, nurse champion, or trained caregiver. Next dressing change due: 07/08/22.   Labs:  LDH trend: 326>400>462  INR trend: 1.1>1.5>1.4  WBC trend: 12.6>18>20.9  Anticoagulation Plan: -INR Goal: 2.0 - 2.5 -ASA Dose: 325 mg until INR therapeutic   Blood Products:  IntraOp 11/14:  - 4 FFP   - DDAVP - 617 cell saver  PostOp 11/14: - 2 FFP  Device: -[employer]tr[employer] -Therapies: OFF  Arrythmias: Afib rate 130s - 150s. Has received 4 Amio boluses. Currently on Amio @ 60 mg  Respiratory: Extubated 07/06/22. On Hi-Flo canula 70%. Using IS hourly.  Renal:  -BUN/CRT: 18/1.23>28/1.37  Drips: Milrinone 0.25 mcg/kg/hr Amiodarone 60 mg/hr Epinephrine 4 mcg/min Lasix 15 mg/hr Heparin 800 units/hr  Pt Education: Drive line dressing change observed by pt's caregivers Jessica and [employer]. VAD coordinator performed dressing change, and verbalized each step.  Will plan to continue dressing change/VAD education with caregivers Monday afternoon.   Plan/Recommendations:  1. Page VAD coordinator for equipment or drive line issues. 2. Daily drive line dressing changes per VAD coordinator, nurse champion, or trained caregiver.  Emerson Monte RN [place] Coordinator  Office: [phone]  24/7 Pager: [phone]

## 2022-07-07 NOTE — Progress Notes (Signed)
TCTS Progress Note:  POD 3 LVAD  Doing very well  Extubated yest afternoon AF started yest night  Extubated and fully intact Resp nonlaboured CV -AF 120-130s Abd soft ntnd Extr wwp   Today's Vitals   07/07/22 0545 07/07/22 0600 07/07/22 0615 07/07/22 0630  BP:      Pulse: (!) 170  (!) 141 (!) 138  Resp: (!) 26 (!) 33 (!) 34 (!) 28  Temp: 98.6 F (37 C) 98.8 F (37.1 C) 98.8 F (37.1 C) 98.6 F (37 C)  TempSrc:      SpO2: (!) 86%  97% 95%  Weight:      Height:      PainSc:       Body mass index is 29.18 kg/m.      Latest Ref Rng & Units 07/07/2022    5:10 AM 07/07/2022    3:54 AM 07/07/2022   12:30 AM  CBC  WBC 4.0 - 10.5 K/uL  20.9    Hemoglobin 13.0 - 17.0 g/dL 11.6  11.4  13.3   Hematocrit 39.0 - 52.0 % 34.0  33.1  39.0   Platelets 150 - 400 K/uL  222         Latest Ref Rng & Units 07/07/2022    5:10 AM 07/07/2022    3:54 AM 07/07/2022   12:30 AM  CMP  Glucose 70 - 99 mg/dL  206  189   BUN 6 - 20 mg/dL  28  27   Creatinine 0.61 - 1.24 mg/dL  1.37  1.30   Sodium 135 - 145 mmol/L 138  137  138   Potassium 3.5 - 5.1 mmol/L 3.4  3.8  3.5   Chloride 98 - 111 mmol/L  102  101   CO2 22 - 32 mmol/L  21    Calcium 8.9 - 10.3 mg/dL  8.4    Total Protein 6.5 - 8.1 g/dL  6.6    Total Bilirubin 0.3 - 1.2 mg/dL  0.7    Alkaline Phos 38 - 126 U/L  89    AST 15 - 41 U/L  57    ALT 0 - 44 U/L  21      ABG    Component Value Date/Time   PHART 7.448 07/07/2022 0510   PCO2ART 30.1 (L) 07/07/2022 0510   PO2ART 106 07/07/2022 0510   HCO3 20.8 07/07/2022 0510   TCO2 22 07/07/2022 0510   ACIDBASEDEF 2.0 07/07/2022 0510   O2SAT 98 07/07/2022 0510    Vent Mode: PSV;CPAP FiO2 (%):  [40 %-100 %] 80 % Set Rate:  [20 bmp] 20 bmp Vt Set:  [600 mL] 600 mL PEEP:  [5 cmH20] 5 cmH20 Pressure Support:  [5 cmH20-10 cmH20] 5 cmH20  N no issues. Denies pain CV: AF 120s, preop heavy aflutter history, appendage clipped intra-op, now on Amio, heparin Decreassing  milrinone and epi given recurrent of AF. Was in sinus POD 1 and POD 2 Resp: supplemental 02. Started metanebs today for bilateral lower lobe atelectasis GI: no issues, slowly introduce diet today GU: completed large diuresis yest, still onging  I/O last 3 completed shifts: In: 3564 [I.V.:2360.5; NG/GT:100; IV Piggyback:1103.6] Out: 8355 [Urine:7425; Chest Tube:930] Heme: on ASA - discussed ASA off On heparin gtt Hgb stable ID: add empiric antibiotics as WC up to 20, broad Endo: per critical care T/l/d: plan for all chest tubes out tomorrow. Will hold heparin x 2 hours before chest tube DC, and 1 hour afterwards. Then restart heparin at  prior dose Misc: consult PT today.   Expected course of ICU through weekend and then floor Monday/Tuesday for 1 week then DC  Justice Rocher MD CV Surgery

## 2022-07-07 NOTE — Progress Notes (Addendum)
Pharmacy Antibiotic Note  Darryl Frye is a 61 y.o. male admitted on 06/27/2022 s/p LVAD and LAA closure 11/14 with new concern for pneumonia. WBCs are trending up at 20.9 and Tmax of 100.6. Extubated 11/16 PM but remains on 80% HFNC. Pharmacy has been consulted for vancomycin and cefepime dosing. Scr improving at 1.37 (BL ~1). Patient completed 48 hours of perioperative antimicrobials from [facility] on 11/16. As part of this regimen, he was on vancomycin 1250 mg IV q12h, last dose 11/16 at 0922 so will not require a loading dose.   Plan: Vancomycin 1000 mg IV q12h (goal trough 15-20 mg/dL) Cefepime 2g IV q8h Monitor s/sx improvement, renal function, cultures, LOT   Height: 5\' 11"  (180.3 cm) Weight: 94.9 kg (209 lb 3.5 oz) (with system controller) IBW/kg (Calculated) : 75.3  Temp (24hrs), Avg:99.5 F (37.5 C), Min:98.4 F (36.9 C), Max:100.6 F (38.1 C)  Recent Labs  Lab 07/04/22 1955 07/05/22 0401 07/05/22 1738 07/06/22 0414 07/06/22 1153 07/06/22 1519 07/07/22 0029 07/07/22 0030 07/07/22 0354  WBC 11.6* 12.6* 16.5* 18.0*  --   --   --   --  20.9*  CREATININE 1.26* 1.23 1.44* 1.51* 1.46* 1.51* 1.47* 1.30* 1.37*    Estimated Creatinine Clearance: 68.2 mL/min (A) (by C-G formula based on SCr of 1.37 mg/dL (H)).    No Known Allergies  Antimicrobials this admission:  vancomycin 11/14 >> 11/16, 11/17 >>  cefepime 11/17 >>  rifampin 11/14 >> 11/15 fluconazole 11/14 >> 11/5 cefazolin 11/14 >> 11/16  Dose adjustments this admission:  Microbiology results: 11/13 MRSA PCR: neg 11/17 Bcx - sent  11/17 - Sputum Cx - sent  Thank you for allowing pharmacy to be a part of this patient's care.  Eliseo Gum, PharmD PGY1 Pharmacy Resident   07/07/2022  8:01 AM

## 2022-07-07 NOTE — Progress Notes (Signed)
Palliative:  I discussed with palliative chaplain who continues to follow for support. I went to check on Darryl Frye but he is with nursing working on education. Will continue to shadow and be available for support as needed.   No charge  Vinie Sill, NP Palliative Medicine Team Pager [phone] (Please see [email or web address] for schedule) Team Phone [phone]

## 2022-07-07 NOTE — Progress Notes (Signed)
  Echocardiogram 2D Echocardiogram has been performed.  Darryl Frye 07/07/2022, 9:33 AM

## 2022-07-07 NOTE — Progress Notes (Addendum)
ANTICOAGULATION CONSULT NOTE   Pharmacy Consult for heparin Indication:  LVAD  No Known Allergies  Patient Measurements: Height: 5\' 11"  (180.3 cm) Weight: 94.9 kg (209 lb 3.5 oz) (with system controller) IBW/kg (Calculated) : 75.3 Heparin Dosing Weight: 95 kg  Vital Signs: Temp: 98.6 F (37 C) (11/17 0630) Temp Source: Core (Comment) (11/17 0400) BP: 83/67 (11/17 0400) Pulse Rate: 138 (11/17 0630)  Labs: Recent Labs    07/05/22 0401 07/05/22 0411 07/05/22 1738 07/05/22 1742 07/06/22 0414 07/06/22 1153 07/06/22 1519 07/06/22 1718 07/07/22 0029 07/07/22 0030 07/07/22 0354 07/07/22 0510  HGB 10.8*   < > 10.6*   < > 10.3*   < >  --    < >  --  13.3 11.4* 11.6*  HCT 33.1*   < > 32.1*   < > 31.3*   < >  --    < >  --  39.0 33.1* 34.0*  PLT 213  --  209  --  199  --   --   --   --   --  222  --   APTT  --   --   --   --  37*  --  39*  --   --   --  40*  --   LABPROT 14.1  --   --   --  18.0*  --   --   --   --   --  17.2*  --   INR 1.1  --   --   --  1.5*  --   --   --   --   --  1.4*  --   HEPARINUNFRC <0.10*  --   --    < > <0.10*  --  <0.10*  --   --   --  <0.10*  --   CREATININE 1.23  --  1.44*  --  1.51*   < > 1.51*  --  1.47* 1.30* 1.37*  --    < > = values in this interval not displayed.     Estimated Creatinine Clearance: 68.2 mL/min (A) (by C-G formula based on SCr of 1.37 mg/dL (H)).   Medical History: Past Medical History:  Diagnosis Date   Acute kidney injury ([place])    Acute respiratory failure ([facility])    Atrial flutter ([facility])    on Eliquis   CHF (congestive heart failure) ([place])    EF 20-25% 10/15/21, AICD in place.   Coronary artery disease    Diabetes mellitus without complication ([facility])    DVT (deep venous thrombosis) ([place]) 06/2019   Heart murmur    Hypertension    Presence of permanent cardiac pacemaker    Renal disorder     Medications:  Scheduled:   sodium chloride   Intravenous Once   acetaminophen  1,000 mg Oral Q6H   Or   acetaminophen  (TYLENOL) oral liquid 160 mg/5 mL  1,000 mg Per Tube Q6H   aspirin  81 mg Oral Daily   atorvastatin  20 mg Oral Daily   bisacodyl  10 mg Oral Daily   Or   bisacodyl  10 mg Rectal Daily   Chlorhexidine Gluconate Cloth  6 each Topical Daily   digoxin  0.125 mg Oral Daily   feeding supplement (GLUCERNA SHAKE)  237 mL Oral QAC lunch   insulin aspart  0-24 Units Subcutaneous Q4H   insulin glargine-yfgn  30 Units Subcutaneous Daily   ipratropium-albuterol  3 mL Nebulization Q6H  levothyroxine  25 mcg Oral Q0600   lidocaine  2 patch Transdermal Q24H   magnesium oxide  400 mg Oral BID   melatonin  3 mg Oral QHS   multivitamin with minerals  1 tablet Oral Daily   pantoprazole  40 mg Oral Daily   polyethylene glycol  17 g Oral BID   potassium chloride  40 mEq Oral Once   senna-docusate  1 tablet Oral BID   sertraline  50 mg Oral Daily   sildenafil  20 mg Oral TID   sodium chloride flush  10-40 mL Intracatheter Q12H   sodium chloride flush  10-40 mL Intracatheter Q12H   sodium chloride flush  3 mL Intravenous Q12H   sodium chloride flush  3 mL Intravenous Q12H   warfarin  5 mg Oral ONCE-1600   Warfarin - Physician Dosing Inpatient   Does not apply q1600    Assessment: 9 yom admitted with HF now s/p HM3 LVAD with LAA clip on 11/14. On apixaban PTA for hx Aflutter (LD 11/6 PTA, now 11/9) - heparin level came back undetectable 11/15 so will not impact levels moving forward. Discussed with Dr. Tenny Craw. Will monitor aPTT and heparin level and slowly adjust to target lower end of therapeutic range (0.3 for heparin level, 65 for aPTT).   Hgb 11.4, PLTs 222 stable. LDH 462 trending up. No s/sx of bleeding or issues with infusion noted by RN this morning. Heparin level undetectable and aPTT 40 subtherapeutic on 700 units/hr. INR 1.4 this morning. Patient was extubated 11/16 PM. Discussed with Dr. Tenny Craw - will initiate warfarin today and will continue IV heparin until INR at goal. No s/sx bleeding or  issues with heparin infusion per RN.   Goal of Therapy:  Heparin level 0.3-0.5 units/ml aPTT 65-85 seconds  INR 2-3 Monitor platelets by anticoagulation protocol: Yes   Plan:  Increase heparin infusion to 800 units/hr  Checking heparin level/aPTT @1500  Starting warfarin per MD - giving 5 mg x 1 today Monitor daily heparin level/aPTT, daily INR, CBC, and for s/sx of bleeding   Eliseo Gum, PharmD PGY1 Pharmacy Resident

## 2022-07-07 NOTE — Progress Notes (Signed)
This chaplain is present for F/U spiritual care. The Pt. is awake and accepting of the visit. Family is not at the bedside.  The chaplain listened reflectively as the Pt. shared his post-op experiences far outweigh his pre-op anticipations. The chaplain understands the Pt. is successfully using his faith(prayer) to calm his anxiety. In the situation the Pt. described his shortness of breath. The chaplain understands the Pt. Is limiting his phone and TV in an effort to stay connected to God. The Pt. shares his family and pastor have visited.  The Pt. accepted the chaplain's invitation for prayer and F/U spiritual care.  Chaplain Sallyanne Kuster [phone]

## 2022-07-07 NOTE — Progress Notes (Signed)
Pt wanted to hold off on doing metaneb at this time. He stated "I just got my breathing under control".

## 2022-07-07 NOTE — Progress Notes (Addendum)
0030: Patient's rhythm intermittently going into afib in the 140s-150s. Chem8 checked on patient and K is 3.5. This RN initiated the TCTS KCl replacement protocol and received verbal orders from Dr Lavonna Monarch for a 150 mg bolus of amiodarone.  0230: Patient's rhythm went into afib sustained in the 160s. Patient's systolic bp dropped to 33X and art line no longer as pulsatile. Patient alert and not c/o other symptoms. This RN received verbal orders from Dr Lavonna Monarch for another 150 mg bolus of amiodarone and 5 mg of metoprolol IV push.  0310: Patient still in afib in 130s/140s. This RN received verbal orders from Dr Lavonna Monarch for another 150 mg bolus of amiodarone.   Jill Poling, RN

## 2022-07-08 ENCOUNTER — Inpatient Hospital Stay (HOSPITAL_COMMUNITY): Payer: Medicare Other

## 2022-07-08 DIAGNOSIS — I255 Ischemic cardiomyopathy: Secondary | ICD-10-CM | POA: Diagnosis not present

## 2022-07-08 DIAGNOSIS — Z95811 Presence of heart assist device: Secondary | ICD-10-CM

## 2022-07-08 DIAGNOSIS — R570 Cardiogenic shock: Secondary | ICD-10-CM | POA: Diagnosis not present

## 2022-07-08 LAB — CBC WITH DIFFERENTIAL/PLATELET
Abs Immature Granulocytes: 0.13 10*3/uL — ABNORMAL HIGH (ref 0.00–0.07)
Basophils Absolute: 0 10*3/uL (ref 0.0–0.1)
Basophils Relative: 0 %
Eosinophils Absolute: 0 10*3/uL (ref 0.0–0.5)
Eosinophils Relative: 0 %
HCT: 29.6 % — ABNORMAL LOW (ref 39.0–52.0)
Hemoglobin: 10.3 g/dL — ABNORMAL LOW (ref 13.0–17.0)
Immature Granulocytes: 1 %
Lymphocytes Relative: 4 %
Lymphs Abs: 0.6 10*3/uL — ABNORMAL LOW (ref 0.7–4.0)
MCH: 28.5 pg (ref 26.0–34.0)
MCHC: 34.8 g/dL (ref 30.0–36.0)
MCV: 82 fL (ref 80.0–100.0)
Monocytes Absolute: 1.4 10*3/uL — ABNORMAL HIGH (ref 0.1–1.0)
Monocytes Relative: 8 %
Neutro Abs: 15.2 10*3/uL — ABNORMAL HIGH (ref 1.7–7.7)
Neutrophils Relative %: 87 %
Platelets: 220 10*3/uL (ref 150–400)
RBC: 3.61 MIL/uL — ABNORMAL LOW (ref 4.22–5.81)
RDW: 15.3 % (ref 11.5–15.5)
WBC: 17.4 10*3/uL — ABNORMAL HIGH (ref 4.0–10.5)
nRBC: 0.1 % (ref 0.0–0.2)

## 2022-07-08 LAB — BASIC METABOLIC PANEL
Anion gap: 16 — ABNORMAL HIGH (ref 5–15)
Anion gap: 17 — ABNORMAL HIGH (ref 5–15)
BUN: 25 mg/dL — ABNORMAL HIGH (ref 6–20)
BUN: 29 mg/dL — ABNORMAL HIGH (ref 6–20)
CO2: 23 mmol/L (ref 22–32)
CO2: 23 mmol/L (ref 22–32)
Calcium: 8.4 mg/dL — ABNORMAL LOW (ref 8.9–10.3)
Calcium: 8.5 mg/dL — ABNORMAL LOW (ref 8.9–10.3)
Chloride: 97 mmol/L — ABNORMAL LOW (ref 98–111)
Chloride: 96 mmol/L — ABNORMAL LOW (ref 98–111)
Creatinine, Ser: 1.27 mg/dL — ABNORMAL HIGH (ref 0.61–1.24)
Creatinine, Ser: 1.35 mg/dL — ABNORMAL HIGH (ref 0.61–1.24)
GFR, Estimated: >60 mL/min (ref >60)
GFR, Estimated: >60 mL/min (ref >60)
Glucose, Bld: 151 mg/dL — ABNORMAL HIGH (ref 70–99)
Glucose, Bld: 206 mg/dL — ABNORMAL HIGH (ref 70–99)
Potassium: 3 mmol/L — ABNORMAL LOW (ref 3.5–5.1)
Potassium: 3.3 mmol/L — ABNORMAL LOW (ref 3.5–5.1)
Sodium: 136 mmol/L (ref 135–145)
Sodium: 136 mmol/L (ref 135–145)

## 2022-07-08 LAB — APTT
aPTT: 41 seconds — ABNORMAL HIGH (ref 24–36)
aPTT: 45 seconds — ABNORMAL HIGH (ref 24–36)
aPTT: 52 seconds — ABNORMAL HIGH (ref 24–36)

## 2022-07-08 LAB — GLUCOSE, CAPILLARY
Glucose-Capillary: 104 mg/dL — ABNORMAL HIGH (ref 70–99)
Glucose-Capillary: 125 mg/dL — ABNORMAL HIGH (ref 70–99)
Glucose-Capillary: 148 mg/dL — ABNORMAL HIGH (ref 70–99)
Glucose-Capillary: 195 mg/dL — ABNORMAL HIGH (ref 70–99)
Glucose-Capillary: 228 mg/dL — ABNORMAL HIGH (ref 70–99)
Glucose-Capillary: 231 mg/dL — ABNORMAL HIGH (ref 70–99)
Glucose-Capillary: 253 mg/dL — ABNORMAL HIGH (ref 70–99)

## 2022-07-08 LAB — EXPECTORATED SPUTUM ASSESSMENT W GRAM STAIN, RFLX TO RESP C

## 2022-07-08 LAB — HEPARIN LEVEL (UNFRACTIONATED)
Heparin Unfractionated: <0.1 IU/mL — ABNORMAL LOW (ref 0.30–0.70)
Heparin Unfractionated: <0.1 IU/mL — ABNORMAL LOW (ref 0.30–0.70)
Heparin Unfractionated: <0.1 IU/mL — ABNORMAL LOW (ref 0.30–0.70)

## 2022-07-08 LAB — POCT I-STAT 7, (LYTES, BLD GAS, ICA,H+H)
Acid-Base Excess: 2 mmol/L (ref 0.0–2.0)
Bicarbonate: 25.4 mmol/L (ref 20.0–28.0)
Calcium, Ion: 1.11 mmol/L — ABNORMAL LOW (ref 1.15–1.40)
HCT: 32 % — ABNORMAL LOW (ref 39.0–52.0)
Hemoglobin: 10.9 g/dL — ABNORMAL LOW (ref 13.0–17.0)
O2 Saturation: 96 %
Patient temperature: 36.7
Potassium: 2.9 mmol/L — ABNORMAL LOW (ref 3.5–5.1)
Sodium: 135 mmol/L (ref 135–145)
TCO2: 26 mmol/L (ref 22–32)
pCO2 arterial: 32.9 mmHg (ref 32–48)
pH, Arterial: 7.493 — ABNORMAL HIGH (ref 7.35–7.45)
pO2, Arterial: 76 mmHg — ABNORMAL LOW (ref 83–108)

## 2022-07-08 LAB — COOXEMETRY PANEL
Carboxyhemoglobin: 1.6 % — ABNORMAL HIGH (ref 0.5–1.5)
Methemoglobin: <0.7 % (ref 0.0–1.5)
O2 Saturation: 98.9 %
Total hemoglobin: 10.5 g/dL — ABNORMAL LOW (ref 12.0–16.0)

## 2022-07-08 LAB — PROTIME-INR
INR: 1.5 — ABNORMAL HIGH (ref 0.8–1.2)
Prothrombin Time: 18.3 seconds — ABNORMAL HIGH (ref 11.4–15.2)

## 2022-07-08 LAB — LACTATE DEHYDROGENASE: LDH: 412 U/L — ABNORMAL HIGH (ref 98–192)

## 2022-07-08 LAB — MAGNESIUM: Magnesium: 2 mg/dL (ref 1.7–2.4)

## 2022-07-08 LAB — PHOSPHORUS: Phosphorus: 3.3 mg/dL (ref 2.5–4.6)

## 2022-07-08 MED ORDER — WARFARIN SODIUM 5 MG PO TABS
5.0000 mg | ORAL_TABLET | Freq: Once | ORAL | Status: AC
  Administered 2022-07-08: 5 mg via ORAL
  Filled 2022-07-08: qty 1

## 2022-07-08 MED ORDER — SPIRONOLACTONE 12.5 MG HALF TABLET
12.5000 mg | ORAL_TABLET | Freq: Every day | ORAL | Status: DC
  Administered 2022-07-08 – 2022-07-09 (×2): 12.5 mg via ORAL
  Filled 2022-07-08 (×2): qty 1

## 2022-07-08 MED ORDER — SPIRONOLACTONE 12.5 MG HALF TABLET: 12.5000 mg | ORAL_TABLET | Freq: Every day | ORAL | Status: DC

## 2022-07-08 MED ORDER — SORBITOL 70 % SOLN
45.0000 mL | Freq: Once | Status: AC
  Administered 2022-07-08: 45 mL via ORAL
  Filled 2022-07-08: qty 60

## 2022-07-08 MED ORDER — POTASSIUM CHLORIDE CRYS ER 20 MEQ PO TBCR
20.0000 meq | EXTENDED_RELEASE_TABLET | ORAL | Status: AC
  Administered 2022-07-08 (×3): 20 meq via ORAL
  Filled 2022-07-08 (×4): qty 1

## 2022-07-08 MED ORDER — POTASSIUM CHLORIDE CRYS ER 20 MEQ PO TBCR
20.0000 meq | EXTENDED_RELEASE_TABLET | ORAL | Status: AC
  Administered 2022-07-08 – 2022-07-09 (×3): 20 meq via ORAL
  Filled 2022-07-08 (×3): qty 1

## 2022-07-08 NOTE — Evaluation (Signed)
Occupational Therapy Evaluation Patient Details Name: Darryl Frye MRN: [id number] DOB: 1961-07-21 Today's Date: 07/08/2022   History of Present Illness Pt is a 61 y.o. male presenting 06/27/22 with CHF for RHC; admitted for inotrope and diuresis.  Other PMH includes NICM s/p ICD, aflutter, CAD, DM, DVT, HTN, heart murmur, renal disorder.  S/p LVAD 11/14.   Clinical Impression   Patient admitted for the diagnosis above.  S/p LVAD.  PTA patient lives in a one bedroom apartment on the 3rd floor, elevator access.  He occasionally would use a quad cane for mobility, works a part time job 3 days/wk, and needed no assist with ADL or iADL.  Deficits impacting independence are listed below.  Currently he is needing Mod A of two for basic mobility and Max A for ADL completion at a seated level.  OT to follow in the acute setting with AIR recommended for post acute rehab.          Recommendations for follow up therapy are one component of a multi-disciplinary discharge planning process, led by the attending physician.  Recommendations may be updated based on patient status, additional functional criteria and insurance authorization.   Follow Up Recommendations  Acute inpatient rehab (3hours/day)     Assistance Recommended at Discharge Frequent or constant Supervision/Assistance  Patient can return home with the following Assist for transportation;Assistance with cooking/housework;A lot of help with walking and/or transfers;A lot of help with bathing/dressing/bathroom    Functional Status Assessment  Patient has had a recent decline in their functional status and demonstrates the ability to make significant improvements in function in a reasonable and predictable amount of time.  Equipment Recommendations  None recommended by OT    Recommendations for Other Services       Precautions / Restrictions Precautions Precautions: Fall;Sternal Precaution Booklet Issued: No Precaution Comments: LVAD,  Chest Tube Restrictions Weight Bearing Restrictions: Yes RUE Weight Bearing: Partial weight bearing LUE Weight Bearing: Partial weight bearing      Mobility Bed Mobility Overal bed mobility: Needs Assistance Bed Mobility: Supine to Sit, Sit to Supine     Supine to sit: Mod assist, +2 for physical assistance Sit to supine: Max assist, +2 for physical assistance     Patient Response: Cooperative  Transfers Overall transfer level: Needs assistance Equipment used: Rolling walker (2 wheels) Transfers: Sit to/from Stand Sit to Stand: Mod assist, +2 physical assistance           General transfer comment: cues for hand placement given sternal precautions      Balance Overall balance assessment: Needs assistance Sitting-balance support: Feet supported Sitting balance-Leahy Scale: Fair   Postural control: Posterior lean Standing balance support: Bilateral upper extremity supported Standing balance-Leahy Scale: Poor                             ADL either performed or assessed with clinical judgement   ADL Overall ADL's : Needs assistance/impaired Eating/Feeding: Set up;Bed level   Grooming: Wash/dry hands;Wash/dry face;Set up;Bed level   Upper Body Bathing: Minimal assistance;Bed level   Lower Body Bathing: Maximal assistance;Bed level   Upper Body Dressing : Moderate assistance;Sitting   Lower Body Dressing: Maximal assistance;Bed level   Toilet Transfer: Moderate assistance;+2 for physical assistance;Stand-pivot;BSC/3in1   Toileting- Clothing Manipulation and Hygiene: Maximal assistance;Sit to/from stand               Vision Patient Visual Report: No change from baseline  Perception     Praxis      Pertinent Vitals/Pain Pain Assessment Pain Assessment: Faces Faces Pain Scale: Hurts a little bit Pain Location: incisional Pain Descriptors / Indicators: Tender Pain Intervention(s): Monitored during session     Hand Dominance  Right   Extremity/Trunk Assessment Upper Extremity Assessment Upper Extremity Assessment: Generalized weakness   Lower Extremity Assessment Lower Extremity Assessment: Defer to PT evaluation   Cervical / Trunk Assessment Cervical / Trunk Assessment: Normal   Communication Communication Communication: No difficulties   Cognition Arousal/Alertness: Awake/alert Behavior During Therapy: WFL for tasks assessed/performed Overall Cognitive Status: Within Functional Limits for tasks assessed                                       General Comments   VSS on supplemental O2    Exercises     Shoulder Instructions      Home Living Family/patient expects to be discharged to:: Private residence Living Arrangements: Alone Available Help at Discharge: Neighbor;Family;Friend(s) Type of Home: Apartment Home Access: Elevator     Home Layout: One level     Bathroom Shower/Tub: Teacher, early years/pre: Standard Bathroom Accessibility: Yes How Accessible: Accessible via walker Home Equipment: Cane - quad          Prior Functioning/Environment Prior Level of Function : Independent/Modified Independent;Driving             Mobility Comments: independent with mobility, intermittent use of SPC for ambulation ADLs Comments: independent for IADLS, self care, was working part time up until a few months ago        OT Problem List: Decreased strength;Decreased activity tolerance;Impaired balance (sitting and/or standing);Pain;Decreased safety awareness;Decreased knowledge of use of DME or AE;Decreased knowledge of precautions      OT Treatment/Interventions: Self-care/ADL training;Therapeutic exercise;Therapeutic activities;Energy conservation;DME and/or AE instruction;Patient/family education;Balance training    OT Goals(Current goals can be found in the care plan section) Acute Rehab OT Goals Patient Stated Goal: Get better and go home OT Goal  Formulation: With patient Time For Goal Achievement: 07/21/22 Potential to Achieve Goals: Good ADL Goals Pt Will Perform Grooming: with supervision;standing Pt Will Perform Upper Body Dressing: with supervision;sitting Pt Will Perform Lower Body Dressing: with min guard assist;sit to/from stand Pt Will Transfer to Toilet: with min guard assist;ambulating;regular height toilet  OT Frequency: Min 2X/week    Co-evaluation PT/OT/SLP Co-Evaluation/Treatment: Yes Reason for Co-Treatment: Complexity of the patient's impairments (multi-system involvement);For patient/therapist safety;To address functional/ADL transfers          AM-PAC OT "6 Clicks" Daily Activity     Outcome Measure Help from another person eating meals?: None Help from another person taking care of personal grooming?: None Help from another person toileting, which includes using toliet, bedpan, or urinal?: A Lot Help from another person bathing (including washing, rinsing, drying)?: A Lot Help from another person to put on and taking off regular upper body clothing?: A Little Help from another person to put on and taking off regular lower body clothing?: A Lot 6 Click Score: 17   End of Session Equipment Utilized During Treatment: Gait belt;Oxygen Nurse Communication: Mobility status  Activity Tolerance: Patient tolerated treatment well Patient left: in bed;with call bell/phone within reach  OT Visit Diagnosis: Unsteadiness on feet (R26.81);Muscle weakness (generalized) (M62.81);Pain Pain - part of body:  (chest)  Time: 0601-5615 OT Time Calculation (min): 24 min Charges:  OT General Charges $OT Visit: 1 Visit OT Evaluation $OT Eval Moderate Complexity: 1 Mod  07/08/2022  RP, OTR/L  Acute Rehabilitation Services  Office:  [phone]   Metta Clines 07/08/2022, 3:07 PM

## 2022-07-08 NOTE — Progress Notes (Signed)
Patient ID: Darryl Frye, male   DOB: [birth year], 61 y.o.   MRN: [id number]   Advanced Heart Failure VAD Team Note  PCP-Cardiologist: Carlyle Dolly, MD   Subjective:    - 11/13 S/P HMIII L atrial appendage closure.  - 11/16 Extubated . GIven 40 mg IV and started on lasix drip 10 mg per/hr.  - 11/17 Developed A fib RVR. Given bolus 150 x 2. Unchanged.   Back in NSR on amio gtt.  MAPs up. Epi turned down from 2-> 1. Speed increased 5100-> 5200 in setting of decreased VAD flows.   Brisk diuresis on lasix drip currently at 15 mg/hr. 6.3L out overnight.   Luiz Blare #s done personally CVP 14 PA 52/29 (34) PCWP 12 Thermo CO/CI 4.3/2.0 PAPi 1.6 SVR 1420 PVR 5.0  I did bedside echo today 11/18 LV small. VAD cannula well aligned. AOv opening every beat. Septum pulling to left RV moderately HK. Small to moderate anterior effusion   LVAD INTERROGATION:  HeartMate III LVAD:   Flow 3.6 liters/min, speed 5100, power 4, PI 7.1  Objective:    Vital Signs:   Temp:  [98.1 F (36.7 C)-99.3 F (37.4 C)] 98.4 F (36.9 C) (11/18 0800) Pulse Rate:  [85-159] 91 (11/18 0852) Resp:  [17-37] 17 (11/18 0852) BP: (103)/(70) 103/70 (11/17 1200) SpO2:  [72 %-99 %] 98 % (11/18 0800) Arterial Line BP: (84-134)/(64-94) 134/94 (11/18 0800) FiO2 (%):  [40 %-70 %] 40 % (11/18 0852) Weight:  [94 kg] 94 kg (11/18 0500) Last BM Date : 07/01/22 Mean arterial Pressure 80-90s   Intake/Output:   Intake/Output Summary (Last 24 hours) at 07/08/2022 0902 Last data filed at 07/08/2022 0700 Gross per 24 hour  Intake 3013.71 ml  Output 5635 ml  Net -2621.29 ml      Physical Exam   General:  NAD.  HEENT: normal  Neck: supple. JVP not elevated.  Carotids 2+ bilat; no bruits. No lymphadenopathy or thryomegaly appreciated. Cor: LVAD hum.  Lungs: Clear. Abdomen: obese soft, nontender, non-distended. No hepatosplenomegaly. No bruits or masses. Good bowel sounds. Driveline site clean. Anchor in place.   Extremities: no cyanosis, clubbing, rash. Warm no edema  Neuro: alert & oriented x 3. No focal deficits. Moves all 4 without problem   Telemetry   NSR 80s Personally reviewed  Labs   Basic Metabolic Panel: Recent Labs  Lab 07/05/22 0401 07/05/22 0411 07/06/22 0414 07/06/22 1153 07/06/22 1518 07/06/22 1519 07/06/22 1718 07/07/22 0029 07/07/22 0030 07/07/22 0354 07/07/22 0510 07/07/22 1557 07/08/22 0408 07/08/22 0421  NA 136   < > 138 140   < > 139   < > 138 138 137 138 135 136 135  K 5.3*   < > 4.3 4.2   < > 3.8   < > 3.5 3.5 3.8 3.4* 3.7 3.0* 2.9*  CL 107   < > 109 110  --  106  --  102 101 102  --  101 97*  --   CO2 21*   < > 21* 20*  --  20*  --  20*  --  21*  --  22 23  --   GLUCOSE 93   < > 150* 153*  --  174*  --  183* 189* 206*  --  180* 151*  --   BUN 18   < > 24* 26*  --  26*  --  27* 27* 28*  --  30* 25*  --   CREATININE 1.23   < >  1.51* 1.46*  --  1.51*  --  1.47* 1.30* 1.37*  --  1.40* 1.27*  --   CALCIUM 8.0*   < > 8.3* 8.4*  --  8.3*  --  8.6*  --  8.4*  --  8.3* 8.4*  --   MG 2.7*   < > 2.7* 2.6*  --  2.4  --   --   --  2.3  --  2.2 2.0  --   PHOS 2.7  --  3.4  --   --   --   --   --   --  3.5  --   --  3.3  --    < > = values in this interval not displayed.     Liver Function Tests: Recent Labs  Lab 07/05/22 0401 07/06/22 0414 07/07/22 0354  AST 90* 102* 57*  ALT 25 27 21   ALKPHOS 70 74 89  BILITOT 0.8 0.6 0.7  PROT 6.5 6.4* 6.6  ALBUMIN 3.6 3.1* 2.9*    No results for input(s): "LIPASE", "AMYLASE" in the last 168 hours. No results for input(s): "AMMONIA" in the last 168 hours.  CBC: Recent Labs  Lab 07/05/22 0401 07/05/22 0411 07/05/22 1738 07/05/22 1742 07/06/22 0414 07/06/22 1518 07/07/22 0030 07/07/22 0354 07/07/22 0510 07/08/22 0408 07/08/22 0421  WBC 12.6*  --  16.5*  --  18.0*  --   --  20.9*  --  17.4*  --   NEUTROABS 10.6*  --   --   --  15.1*  --   --  18.6*  --  15.2*  --   HGB 10.8*   < > 10.6*   < > 10.3*   < >  13.3 11.4* 11.6* 10.3* 10.9*  HCT 33.1*   < > 32.1*   < > 31.3*   < > 39.0 33.1* 34.0* 29.6* 32.0*  MCV 87.6  --  84.9  --  85.8  --   --  83.6  --  82.0  --   PLT 213  --  209  --  199  --   --  222  --  220  --    < > = values in this interval not displayed.     INR: Recent Labs  Lab 07/04/22 1340 07/04/22 1424 07/05/22 0401 07/06/22 0414 07/07/22 0354  INR 1.3* 1.3*  NOT CALCULATED 1.1 1.5* 1.4*   :    Imaging   DG Chest Port 1 View  Result Date: 07/08/2022 CLINICAL DATA:  LEFT ventricular assist device. EXAM: PORTABLE CHEST 1 VIEW COMPARISON:  07/07/2022 and prior studies FINDINGS: Cardiomegaly, median sternotomy wires, LEFT atrial clip, LEFT subclavian pacemaker, mediastinal and thoracostomy tubes, RIGHT PICC line with tip difficult to visualize, and RIGHT IJ Swan-Ganz catheter with tip overlying the pulmonary outflow tract/main pulmonary artery again noted. An LVAD is unchanged. Bilateral interstitial and airspace opacities and scattered atelectasis again noted. There is no evidence of pneumothorax or large pleural effusion. IMPRESSION: Unchanged appearance of the chest with bilateral interstitial/airspace opacities and scattered atelectasis. Support apparatus as described. No evidence of pneumothorax. Electronically Signed   By: Margarette Canada M.D.   On: 07/08/2022 08:16   DG CHEST PORT 1 VIEW  Result Date: 07/07/2022 CLINICAL DATA:  PICC EXAM: PORTABLE CHEST 1 VIEW COMPARISON:  Same day chest radiograph FINDINGS: Interval placement of a right arm PICC with the tip visualized to the level of the brachiocephalic vein. The distal portion of the PICC/the tip of  the PICC is poorly visualized due to superimposition of the right IJ central venous sheath and PA catheter. Within this limitation, the PICC is likely positioned in the mid to upper SVC. Left-sided dual lead cardiac device in place, unchanged in positioning. There is an LVAD in place. Atrial appendage occluded device is also  in place. Status post median sternotomy. Bilateral thoracostomy tubes iin place. No pneumothorax. No pleural effusion. Unchanged enlarged cardiac contours. Persistent bibasilar pulmonary opacities with improved aeration of the right lung base. No new focal airspace opacities visualized. There are bilateral prominent interstitial opacities, favored to represent pulmonary edema. IMPRESSION: 1. Interval placement of a right arm PICC with the tip visualized to the level of the brachiocephalic vein. The distal portion of the PICC/the tip of the PICC is poorly visualized due to superimposition of the right IJ central venous sheath and PA catheter. Within this limitation, the tip of the PICC is likely positioned in the mid to upper SVC. 2. Persistent bibasilar pulmonary opacities with improved aeration of the right lung base Electronically Signed   By: Marin Roberts M.D.   On: 07/07/2022 16:25   ECHOCARDIOGRAM LIMITED  Result Date: 07/07/2022    ECHOCARDIOGRAM LIMITED REPORT   Patient Name:   DAKAI BRAITHWAITE Basurto Date of Exam: 07/06/2022 Medical Rec #:  [id number]      Height:       71.0 in Accession #:    [id number]     Weight:       208.8 lb Date of Birth:  [birth year]      BSA:          2.147 m Patient Age:    47 years       BP:           0/0 mmHg Patient Gender: M              HR:           95 bpm. Exam Location:  Inpatient Procedure: Limited Echo and Color Doppler Indications:    CHF-acute systolic  History:        Patient has prior history of Echocardiogram examinations, most                 recent 06/28/2022. CHF and Cardiomyopathy, Defibrillator,                 Arrythmias:Atrial Flutter and Atrial Fibrillation,                 Signs/Symptoms:Shortness of Breath; Risk Factors:Former Smoker.                 HeartMate 3 LVAD at 5200 RPM.  Sonographer:    Clayton Lefort RDCS (AE) Referring Phys: [id number] Elby Showers [facility]  Sonographer Comments: Technically difficult study due to poor echo windows, echo performed with patient supine  and on artificial respirator and patient is obese. Image acquisition challenging due to patient body habitus. IMPRESSIONS  1. The interventricular septum appears to be mid-line. Left ventricular ejection fraction, by estimation, is <20%. The left ventricle has severely decreased function. The left ventricle demonstrates global hypokinesis. The left ventricular internal cavity size was mildly dilated.  2. Right ventricular systolic function was not well visualized. The right ventricular size is not well visualized.  3. The mitral valve is normal in structure. No evidence of mitral valve regurgitation. No evidence of mitral stenosis.  4. The aortic valve does not open. The aortic valve is tricuspid. FINDINGS  Left Ventricle: The  interventricular septum appears to be mid-line. Left ventricular ejection fraction, by estimation, is <20%. The left ventricle has severely decreased function. The left ventricle demonstrates global hypokinesis. The left ventricular internal cavity size was mildly dilated. There is no left ventricular hypertrophy. Right Ventricle: The right ventricular size is not well visualized. Right ventricular systolic function was not well visualized. Mitral Valve: The mitral valve is normal in structure. No evidence of mitral valve stenosis. Aortic Valve: The aortic valve does not open. The aortic valve is tricuspid. Additional Comments: Color Doppler performed.  LEFT VENTRICLE PLAX 2D LVIDd:         3.90 cm  Dalton McleanMD Electronically signed by Franki Monte Signature Date/Time: 07/07/2022/4:13:32 PM    Final    ECHOCARDIOGRAM LIMITED  Result Date: 07/07/2022    ECHOCARDIOGRAM LIMITED REPORT   Patient Name:   ALIZE ACY Date of Exam: 07/07/2022 Medical Rec #:  [id number]      Height:       71.0 in Accession #:    [id number]     Weight:       209.2 lb Date of Birth:  Jul 18, 1961      BSA:          2.149 m Patient Age:    21 years       BP:           0/0 mmHg Patient Gender: M               HR:           127 bpm. Exam Location:  Inpatient Procedure: Limited Echo and Color Doppler Indications:    LVAD evaluation  History:        Patient has prior history of Echocardiogram examinations, most                 recent 07/07/2022. CHF and Cardiomyopathy, Defibrillator,                 Arrythmias:Atrial Fibrillation and Atrial Flutter,                 Signs/Symptoms:Shortness of Breath; Risk Factors:Former Smoker.                 HeartMate 3 LVAD.  Sonographer:    Clayton Lefort RDCS (AE) Referring Phys: Elby Showers [facility]  Sonographer Comments: Technically difficult study due to poor echo windows. IMPRESSIONS  1. HM3 LVAD. 5100 rpm. LVEF <20%. Septum is midline, no MR. AoV opens every 4-5th beat. RV mildly dilated with severely reduced function. Limited study. Tachycardia noted. RV function is noticeably worse compared with recent outpatient echo.  2. Left ventricular ejection fraction, by estimation, is <20%. The left ventricle has severely decreased function. Left ventricular diastolic function could not be evaluated.  3. Right ventricular systolic function is severely reduced. The right ventricular size is mildly enlarged.  4. The mitral valve is grossly normal. No evidence of mitral valve regurgitation. No evidence of mitral stenosis. Comparison(s): Changes from prior study are noted. The right ventricular systolic function is significantly worse. FINDINGS  Left Ventricle: Left ventricular ejection fraction, by estimation, is <20%. The left ventricle has severely decreased function. The left ventricular internal cavity size was normal in size. There is no left ventricular hypertrophy. Left ventricular diastolic function could not be evaluated. Left ventricular diastolic function could not be evaluated due to atrial fibrillation. Right Ventricle: The right ventricular size is mildly enlarged. No increase in right ventricular wall thickness. Right ventricular systolic  function is severely reduced. Pericardium:  There is no evidence of pericardial effusion. Mitral Valve: The mitral valve is grossly normal. No evidence of mitral valve stenosis. Tricuspid Valve: The tricuspid valve is grossly normal. Tricuspid valve regurgitation is mild . No evidence of tricuspid stenosis. Additional Comments: A device lead is visualized in the right atrium and right ventricle. Color Doppler performed.  LEFT VENTRICLE PLAX 2D LVIDd:         4.70 cm LV PW:         1.00 cm LV IVS:        1.00 cm  Eleonore Chiquito MD Electronically signed by Eleonore Chiquito MD Signature Date/Time: 07/07/2022/11:00:52 AM    Final    DG CHEST PORT 1 VIEW  Result Date: 07/07/2022 CLINICAL DATA:  2 presenting for evaluation of chest tube following LVAD. EXAM: PORTABLE CHEST 1 VIEW COMPARISON:  July 06, 2022. FINDINGS: LEFT-side dual lead pacer defibrillator with power pack over the LEFT chest. Sternotomy wires in place with similar configuration compared to previous imaging. RIGHT IJ Swan-Ganz catheter is transmitted through vascular sheath into the main pulmonary artery with similar positioning compared to prior imaging. LEFT chest tube and chest support tube likely in mediastinum projecting over central chest with similar appearance compared to previous imaging. RIGHT-sided chest tube also in place terminating over the RIGHT hemidiaphragm. Additional tube terminates over the lower mediastinum just above the LEFT hemidiaphragm. Changes of LEFT ventricular assist device placement with similar appearance compared to previous imaging. Cardiomediastinal contours and hilar structures are stable following changes of sternotomy and LVAD placement. Areas of airspace disease at the lung bases is similar to previous imaging. No gross evidence of pneumothorax. There is subtle lucency at the LEFT costodiaphragmatic sulcus that is unchanged adjacent to the cardiac assist device. On limited assessment no acute skeletal findings. IMPRESSION: 1. No significant  interval change in the appearance of the chest since previous imaging. 2. Subtle lucency at the LEFT costodiaphragmatic sulcus that is unchanged compared to previous imaging. This may represent artifact related to density of cardiac assist device and adjacent soft tissues and underlying lung. Very small basilar pneumothorax or small amount of gas adjacent to the assist device is possible, attention on follow-up. 3. Bibasilar airspace disease similar to previous imaging may represent volume loss but would correlate with signs of pneumonia and suggest attention on subsequent imaging. Electronically Signed   By: Zetta Bills M.D.   On: 07/07/2022 08:14   DG CHEST PORT 1 VIEW  Result Date: 07/06/2022 CLINICAL DATA:  Hypoxia EXAM: PORTABLE CHEST 1 VIEW COMPARISON:  07/06/2022 FINDINGS: Left pacer and LVAD remain in place, unchanged. Swan-Ganz catheter tip is in the main pulmonary artery, stable. Endotracheal tube and NG tube have been removed. Mild cardiomegaly with vascular congestion. Bilateral perihilar and lower lobe opacities may reflect edema, atelectasis or pneumonia, or a combination. This is increased since prior study. No visible effusions. IMPRESSION: Increasing perihilar and lower lobe airspace opacities could reflect edema, atelectasis, pneumonia, or a combination. Electronically Signed   By: Rolm Baptise M.D.   On: 07/06/2022 19:54   [place] EKG SITE RITE  Result Date: 07/06/2022 If Site Rite image not attached, placement could not be confirmed due to current cardiac rhythm.    Medications:     Scheduled Medications:  sodium chloride   Intravenous Once   acetaminophen  1,000 mg Oral Q6H   Or   acetaminophen (TYLENOL) oral liquid 160 mg/5 mL  1,000 mg Per Tube Q6H  aspirin  81 mg Oral Daily   atorvastatin  20 mg Oral Daily   bisacodyl  10 mg Oral Daily   Or   bisacodyl  10 mg Rectal Daily   Chlorhexidine Gluconate Cloth  6 each Topical Daily   digoxin  0.125 mg Oral Daily   feeding  supplement  237 mL Oral TID BM   insulin aspart  0-24 Units Subcutaneous Q4H   insulin glargine-yfgn  35 Units Subcutaneous Daily   ipratropium-albuterol  3 mL Nebulization Q6H   levothyroxine  25 mcg Oral Q0600   lidocaine  2 patch Transdermal Q24H   magnesium oxide  400 mg Oral BID   melatonin  3 mg Oral QHS   multivitamin with minerals  1 tablet Oral Daily   pantoprazole  40 mg Oral Daily   polyethylene glycol  17 g Oral BID   potassium chloride  20 mEq Oral Q4H   senna-docusate  1 tablet Oral BID   sertraline  50 mg Oral Daily   sildenafil  20 mg Oral TID   sodium chloride flush  10-40 mL Intracatheter Q12H   sodium chloride flush  10-40 mL Intracatheter Q12H   sodium chloride flush  3 mL Intravenous Q12H   sodium chloride flush  3 mL Intravenous Q12H   Warfarin - Physician Dosing Inpatient   Does not apply q1600    Infusions:  sodium chloride Stopped (07/05/22 0554)   sodium chloride     sodium chloride     amiodarone 60 mg/hr (07/08/22 0759)   ceFEPime (MAXIPIME) IV 2 g (07/08/22 0525)   epinephrine 3 mcg/min (07/08/22 0500)   furosemide (LASIX) 200 mg in dextrose 5 % 100 mL (2 mg/mL) infusion 15 mg/hr (07/08/22 0500)   heparin 1,050 Units/hr (07/08/22 0500)   lactated ringers     lactated ringers Stopped (07/04/22 2207)   lactated ringers 20 mL/hr at 07/08/22 0500   milrinone 0.25 mcg/kg/min (07/08/22 0500)   norepinephrine (LEVOPHED) Adult infusion Stopped (07/06/22 0221)   vancomycin 1,000 mg (07/08/22 0804)    PRN Medications: sodium chloride, acetaminophen, ALPRAZolam, dextrose, hydrALAZINE, lactated ringers, morphine injection, ondansetron (ZOFRAN) IV, ondansetron (ZOFRAN) IV, mouth rinse, oxyCODONE, sodium chloride flush, sodium chloride flush, sodium chloride flush, traMADol   Patient Profile   Admitted with A/C HFrEF --> cardiogenic shock. S/P HMIII LVAD  Assessment/Plan:    1. Acute on chronic systolic CHF/cardiogenic shock: Nonischemic cardiomyopathy,  diagnosed 2020.  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  Cath in 12/20 with no significant coronary disease.  [employer] ICD. Most recent echo in 2/23 showed EF 20-25% with normal RV.  Recently, patient has been symptomatically worse, NYHA class IV with profound orthopnea. RHC showed markedly elevated filling pressures, primarily pulmonary venous hypertension, low cardiac output, and low PAPI.  Patient was admitted for inotrope and diuresis. Echo this admission with EF <20%, mild RV dysfunction.  He was started on milrinone with improvement in hemodynamics.  We proceeded with HM3 LVAD with LAA clip on 11/14.  This morning, he is on epinephrine 1, milrinone 0.25, sildenafil 20 tid.  Swan numbers c/w RV failure and high afterload.  - Increase milrinone to 0.375 mcg.  EPI decreased 2->1 by Dr. Tenny Craw - Volume improving LV well unloaded. Decrease lasix gtt to 10  - Continue digoxin 0.125 daily.  -  Continue sildenafil 20 tid.  2. A fib RVR : H/O S/p flutter ablation.   -Back in NSR. Continue IV amio -Continue heparin/loading warfarin per TCTS 3. VAD management -  s/p HM-3 VAD on 11/14 - VAD speed increased 5100-> 5200 by Dr. Tenny Craw. - bedside echo today 11/18 LV small. VAD cannula well aligned. AOv opening every beat. Septum pulling to left RV moderately HK. Small to moderate anterior effusion - Continue heparin gtt/warfarin per CT surgery.  INR pending - LDH 462 -> 412 - MAPs up. Epi decreased. Arlyce Harman added 4. H/o DVT: On AC 5. Acute hypoxemic respiratory failure, post-op - 11/16 Extubated  - CXR with worse edema today. Continue IV diuresis . 6. AKI: Creatinine improved 1.4 -> 1.2, follow closely with diuresis.  7. ID: Low grade fever and leukocytosis post-op.  Tm 99.3 Follow closely.   - on vanc/cefepime 8.  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  9. Hypokalemia - K 2.9. will supp - add spiro 12.5   I reviewed the LVAD parameters from today, and compared the results to the patient's prior recorded data.  No programming changes were made.  The LVAD is functioning within specified parameters.  The patient performs LVAD self-test daily.  LVAD interrogation was negative for any significant power changes, alarms or PI events/speed drops.  LVAD equipment check completed and is in good working order.  Back-up equipment present.   LVAD education done on emergency procedures and precautions and reviewed exit site care.  CRITICAL CARE Performed by: Glori Bickers  Total critical care time: 35 minutes  Critical care time was exclusive of separately billable procedures and treating other patients.  Critical care was necessary to treat or prevent imminent or life-threatening deterioration.  Critical care was time spent personally by me (independent of midlevel providers or residents) on the following activities: development of treatment plan with patient and/or surrogate as well as nursing, discussions with consultants, evaluation of patient's response to treatment, examination of patient, obtaining history from patient or surrogate, ordering and performing treatments and interventions, ordering and review of laboratory studies, ordering and review of radiographic studies, pulse oximetry and re-evaluation of patient's condition.    Length of Stay: [place], MD 07/08/2022, 9:02 AM  VAD Team --- VAD ISSUES ONLY--- Pager [phone] (7am - 7am) Advanced Heart Failure Team  Pager [phone] (M-F; 7a - 5p)  Please contact [facility] for night-coverage after hours (5p -7a ) and weekends on [email or web address]

## 2022-07-08 NOTE — Evaluation (Signed)
Physical Therapy Evaluation Patient Details Name: Darryl Frye MRN: [id number] DOB: 06-Mar-1961 Today's Date: 07/08/2022  History of Present Illness  Pt is a 61 y.o. male presenting 06/27/22 with CHF for RHC; admitted for inotrope and diuresis.  Other PMH includes NICM s/p ICD, aflutter, CAD, DM, DVT, HTN, heart murmur, renal disorder.  S/p LVAD 11/14.  Clinical Impression  Pt POD #4 LVAD placement. PTA, pt lives in a third floor apartment with elevator access and works part time. Pt presents with decreased cardiopulmonary endurance, weakness, and impaired balance. Pt requiring two person assist for functional mobility. Able to stand from edge of bed and take lateral steps today. + diaphoresis and feeling hot, MAP > 70, HR 97. Placed bed in chair position upon conclusion of session. Suspect good progress given age, motivation, PLOF. Pt will benefit from post acute rehab to address deficits and maximize functional mobility.  Flow 4.1 liters/min, speed 5300, power 3.9, PI 4.3      Recommendations for follow up therapy are one component of a multi-disciplinary discharge planning process, led by the attending physician.  Recommendations may be updated based on patient status, additional functional criteria and insurance authorization.  Follow Up Recommendations Acute inpatient rehab (3hours/day)      Assistance Recommended at Discharge Frequent or constant Supervision/Assistance  Patient can return home with the following  A little help with walking and/or transfers;A little help with bathing/dressing/bathroom;Assistance with cooking/housework;Assist for transportation;Help with stairs or ramp for entrance    Equipment Recommendations Other (comment) (TBA)  Recommendations for Other Services  Rehab consult    Functional Status Assessment Patient has had a recent decline in their functional status and demonstrates the ability to make significant improvements in function in a reasonable and  predictable amount of time.     Precautions / Restrictions Precautions Precautions: Fall;Sternal Precaution Booklet Issued: No Precaution Comments: LVAD, Chest Tube, HHFNC Restrictions Weight Bearing Restrictions: Yes (sternal precautions) RUE Weight Bearing: Partial weight bearing LUE Weight Bearing: Partial weight bearing      Mobility  Bed Mobility Overal bed mobility: Needs Assistance Bed Mobility: Supine to Sit, Sit to Supine     Supine to sit: +2 for physical assistance, Mod assist Sit to supine: +2 for physical assistance, Max assist   General bed mobility comments: Min cues for use of sternal pillow, able to bring BLE's off edge of bed with increased time, trunk assist to ascend. Assist to elevate BLE's back into bed    Transfers Overall transfer level: Needs assistance Equipment used: None Transfers: Sit to/from Stand Sit to Stand: Min assist, +2 physical assistance, Mod assist           General transfer comment: cues for hand placement given sternal precautions. Taking lateral steps up towards [facility]    Ambulation/Gait                  Stairs            Wheelchair Mobility    Modified Rankin (Stroke Patients Only)       Balance Overall balance assessment: Needs assistance Sitting-balance support: Feet supported Sitting balance-Leahy Scale: Fair     Standing balance support: Bilateral upper extremity supported Standing balance-Leahy Scale: Poor Standing balance comment: reliant on external support                             Pertinent Vitals/Pain Pain Assessment Pain Assessment: Faces Faces Pain Scale: Hurts a little  bit Pain Location: incisional Pain Descriptors / Indicators: Tender Pain Intervention(s): Limited activity within patient's tolerance    Home Living Family/patient expects to be discharged to:: Private residence Living Arrangements: Alone Available Help at Discharge: Neighbor;Family;Friend(s) Type of  Home: Apartment Home Access: Elevator       Home Layout: One level Home Equipment: Cane - quad      Prior Function Prior Level of Function : Independent/Modified Independent;Driving             Mobility Comments: independent with mobility, intermittent use of SPC for ambulation ADLs Comments: independent for IADLS, self care, was working part time up until a few months ago     Hand Dominance   Dominant Hand: Right    Extremity/Trunk Assessment   Upper Extremity Assessment Upper Extremity Assessment: Defer to OT evaluation    Lower Extremity Assessment Lower Extremity Assessment: Overall WFL for tasks assessed    Cervical / Trunk Assessment Cervical / Trunk Assessment: Normal  Communication   Communication: No difficulties  Cognition Arousal/Alertness: Awake/alert Behavior During Therapy: WFL for tasks assessed/performed Overall Cognitive Status: Within Functional Limits for tasks assessed                                          General Comments      Exercises General Exercises - Lower Extremity Trick Arc Quad: Both, 5 reps, Seated Heel Slides: Both, 5 reps, Supine   Assessment/Plan    PT Assessment Patient needs continued PT services  PT Problem List Decreased strength;Decreased activity tolerance;Decreased balance;Decreased mobility;Cardiopulmonary status limiting activity       PT Treatment Interventions DME instruction;Stair training;Gait training;Functional mobility training;Therapeutic activities;Therapeutic exercise;Balance training;Patient/family education    PT Goals (Current goals can be found in the Care Plan section)  Acute Rehab PT Goals Patient Stated Goal: feel better PT Goal Formulation: With patient Time For Goal Achievement: 07/22/22 Potential to Achieve Goals: Good    Frequency Min 3X/week     Co-evaluation PT/OT/SLP Co-Evaluation/Treatment: Yes Reason for Co-Treatment: Complexity of the patient's impairments  (multi-system involvement);For patient/therapist safety;To address functional/ADL transfers PT goals addressed during session: Mobility/safety with mobility         AM-PAC PT "6 Clicks" Mobility  Outcome Measure Help needed turning from your back to your side while in a flat bed without using bedrails?: A Little Help needed moving from lying on your back to sitting on the side of a flat bed without using bedrails?: A Lot Help needed moving to and from a bed to a chair (including a wheelchair)?: A Lot Help needed standing up from a chair using your arms (e.g., wheelchair or bedside chair)?: A Lot Help needed to walk in hospital room?: Total Help needed climbing 3-5 steps with a railing? : Total 6 Click Score: 11    End of Session Equipment Utilized During Treatment: Oxygen Activity Tolerance: Patient tolerated treatment well Patient left: in bed;with call bell/phone within reach Nurse Communication: Mobility status PT Visit Diagnosis: Unsteadiness on feet (R26.81);Difficulty in walking, not elsewhere classified (R26.2)    Time: 2119-4174 PT Time Calculation (min) (ACUTE ONLY): 22 min   Charges:   PT Evaluation $PT Eval High Complexity: 1 High          Wyona Almas, PT, DPT Acute Rehabilitation Services Office [phone]   [name] 07/08/2022, 4:10 PM

## 2022-07-08 NOTE — Progress Notes (Signed)
TCTS Progress Note: POD 4 LVAD doing well  Epi at 2 Milrinone .25 Flows at 3.0- this is down from 4.2 yesterday I turned up to 5200 - flows increased to 3.5 Neuro awake and interactive.  Converted overnight to sinus  AM plan: Speed increase to 5200, consider for 5300 later Epi 2 to 1 Heparin hold around time of CT removal as discussed Dose coumadin based on INR.      Latest Ref Rng & Units 07/08/2022    4:21 AM 07/08/2022    4:08 AM 07/07/2022    5:10 AM  CBC  WBC 4.0 - 10.5 K/uL  17.4    Hemoglobin 13.0 - 17.0 g/dL 10.9  10.3  11.6   Hematocrit 39.0 - 52.0 % 32.0  29.6  34.0   Platelets 150 - 400 K/uL  220         Latest Ref Rng & Units 07/08/2022    4:21 AM 07/08/2022    4:08 AM 07/07/2022    3:57 PM  CMP  Glucose 70 - 99 mg/dL  151  180   BUN 6 - 20 mg/dL  25  30   Creatinine 0.61 - 1.24 mg/dL  1.27  1.40   Sodium 135 - 145 mmol/L 135  136  135   Potassium 3.5 - 5.1 mmol/L 2.9  3.0  3.7   Chloride 98 - 111 mmol/L  97  101   CO2 22 - 32 mmol/L  23  22   Calcium 8.9 - 10.3 mg/dL  8.4  8.3     ABG    Component Value Date/Time   PHART 7.493 (H) 07/08/2022 0421   PCO2ART 32.9 07/08/2022 0421   PO2ART 76 (L) 07/08/2022 0421   HCO3 25.4 07/08/2022 0421   TCO2 26 07/08/2022 0421   ACIDBASEDEF 2.0 07/07/2022 0510   O2SAT 96 07/08/2022 0421    FiO2 (%):  [40 %-70 %] 40 %  A/P   N no issues. Denies pain. Got 1 dose oxy this AM. Would expect he needs oxy x 2-3 days.  CV: converted to sinus from AF 120-140s, preop heavy aflutter history, appendage clipped intra-op, now on Amio, heparin Epi decreased to 1, milrinone to 0.25 Resp: supplemental 02. Started metanebs yesterday for bilateral lower lobe atelectasis Removing 3/4 chest tubes today, they are marked. GI: no issues, slowly introduce diet today GU: completed large diuresis yest, still onging I/O last 3 completed shifts: In: 44.1 [P.O.:960; I.V.:2609.6; IV Piggyback:1081.4] Out: 7673 [Urine:8800; Chest  Tube:230] Heme: on ASA 81 On heparin gtt Obtain Inr and dose coumadin, - if <1.6 then coumadin 5. Hgb stable ID: added empiric antibiotics as WC was up to 20, now in teens Endo: per critical care T/l/d: pulling 3/4 chest tubes today.  Misc: PT is consulted.   Expected course of ICU through weekend and then floor Monday/Tuesday for 1 week then DC   Justice Rocher MD CV Surgery

## 2022-07-08 NOTE — Progress Notes (Signed)
ANTICOAGULATION CONSULT NOTE   Pharmacy Consult for heparin Indication:  LVAD  No Known Allergies  Patient Measurements: Height: 5\' 11"  (180.3 cm) Weight: 94 kg (207 lb 3.7 oz) IBW/kg (Calculated) : 75.3 Heparin Dosing Weight: 95 kg  Vital Signs: Temp: 99.8 F (37.7 C) (11/18 2000) Temp Source: Oral (11/18 2000) BP: 104/79 (11/18 1600) Pulse Rate: 95 (11/18 2028)  Labs: Recent Labs    07/06/22 0414 07/06/22 1153 07/07/22 0354 07/07/22 0510 07/07/22 1557 07/07/22 2325 07/08/22 0408 07/08/22 0421 07/08/22 0900 07/08/22 1833 07/08/22 2039  HGB 10.3*   < > 11.4* 11.6*  --   --  10.3* 10.9*  --   --   --   HCT 31.3*   < > 33.1* 34.0*  --   --  29.6* 32.0*  --   --   --   PLT 199  --  222  --   --   --  220  --   --   --   --   APTT 37*   < > 40*  --  37* 41*  --   --  45*  --  52*  LABPROT 18.0*  --  17.2*  --   --   --   --   --  18.3*  --   --   INR 1.5*  --  1.4*  --   --   --   --   --  1.5*  --   --   HEPARINUNFRC <0.10*   < > <0.10*  --  <0.10* <0.10*  --   --  <0.10*  --  <0.10*  CREATININE 1.51*   < > 1.37*  --  1.40*  --  1.27*  --   --  1.35*  --    < > = values in this interval not displayed.     Estimated Creatinine Clearance: 69 mL/min (A) (by C-G formula based on SCr of 1.35 mg/dL (H)).   Medical History: Past Medical History:  Diagnosis Date   Acute kidney injury ([place])    Acute respiratory failure (HCC)    Atrial flutter (HCC)    on Eliquis   CHF (congestive heart failure) ([place])    EF 20-25% 10/15/21, AICD in place.   Coronary artery disease    Diabetes mellitus without complication (HCC)    DVT (deep venous thrombosis) ([place]) 06/2019   Heart murmur    Hypertension    Presence of permanent cardiac pacemaker    Renal disorder     Medications:  Scheduled:   sodium chloride   Intravenous Once   acetaminophen  1,000 mg Oral Q6H   Or   acetaminophen (TYLENOL) oral liquid 160 mg/5 mL  1,000 mg Per Tube Q6H   aspirin  81 mg Oral Daily    atorvastatin  20 mg Oral Daily   bisacodyl  10 mg Oral Daily   Or   bisacodyl  10 mg Rectal Daily   Chlorhexidine Gluconate Cloth  6 each Topical Daily   digoxin  0.125 mg Oral Daily   feeding supplement  237 mL Oral TID BM   insulin aspart  0-24 Units Subcutaneous Q4H   insulin glargine-yfgn  35 Units Subcutaneous Daily   ipratropium-albuterol  3 mL Nebulization Q6H   levothyroxine  25 mcg Oral Q0600   lidocaine  2 patch Transdermal Q24H   magnesium oxide  400 mg Oral BID   melatonin  3 mg Oral QHS   multivitamin with minerals  1 tablet Oral Daily   pantoprazole  40 mg Oral Daily   polyethylene glycol  17 g Oral BID   potassium chloride  20 mEq Oral Q4H   senna-docusate  1 tablet Oral BID   sertraline  50 mg Oral Daily   sildenafil  20 mg Oral TID   sodium chloride flush  10-40 mL Intracatheter Q12H   sodium chloride flush  10-40 mL Intracatheter Q12H   sodium chloride flush  3 mL Intravenous Q12H   sodium chloride flush  3 mL Intravenous Q12H   spironolactone  12.5 mg Oral Daily   Warfarin - Physician Dosing Inpatient   Does not apply q1600    Assessment: 41 yom admitted with HF now s/p HM3 LVAD with LAA clip on 11/14. On apixaban PTA for hx Aflutter (LD 11/6 PTA, now 11/9) - heparin level came back undetectable 11/15 so will not impact levels moving forward. Discussed with Dr. Tenny Craw. Will monitor aPTT and heparin level and slowly adjust to target lower end of therapeutic range (0.3 for heparin level, 65 for aPTT).   Warfarin started yesterday, INR 1.5. Heparin level <0.1, aPTT is 52sec at goal per LVAD team guideline post op goal - on heparin drip 1150 uts/hr   Goal of Therapy:  Heparin level 0.3-0.5 units/ml aPTT 65-85 seconds  INR 2-3 Monitor platelets by anticoagulation protocol: Yes   Plan:  Continue heparin infusion to 1150 units/hr  Daily heparin level aptt and cbc    Bonnita Nasuti Pharm.D. CPP, BCPS Clinical Pharmacist [phone] 07/08/2022 9:40  PM

## 2022-07-08 NOTE — Progress Notes (Signed)
ANTICOAGULATION CONSULT NOTE   Pharmacy Consult for heparin Indication:  LVAD Brief A/P: Heparin level and aPTT subtherapeutic Increase Heparin rate  No Known Allergies  Patient Measurements: Height: 5\' 11"  (180.3 cm) Weight: 94.9 kg (209 lb 3.5 oz) (with system controller) IBW/kg (Calculated) : 75.3 Heparin Dosing Weight: 95 kg  Vital Signs: Temp: 98.2 F (36.8 C) (11/18 0015) Temp Source: Core (11/18 0000) Pulse Rate: 89 (11/18 0015)  Labs: Recent Labs    07/05/22 0401 07/05/22 0411 07/05/22 1738 07/05/22 1742 07/06/22 0414 07/06/22 1153 07/07/22 0030 07/07/22 0354 07/07/22 0510 07/07/22 1557 07/07/22 2325  HGB 10.8*   < > 10.6*   < > 10.3*   < > 13.3 11.4* 11.6*  --   --   HCT 33.1*   < > 32.1*   < > 31.3*   < > 39.0 33.1* 34.0*  --   --   PLT 213  --  209  --  199  --   --  222  --   --   --   APTT  --   --   --   --  37*   < >  --  40*  --  37* 41*  LABPROT 14.1  --   --   --  18.0*  --   --  17.2*  --   --   --   INR 1.1  --   --   --  1.5*  --   --  1.4*  --   --   --   HEPARINUNFRC <0.10*  --   --    < > <0.10*   < >  --  <0.10*  --  <0.10* <0.10*  CREATININE 1.23  --  1.44*  --  1.51*   < > 1.30* 1.37*  --  1.40*  --    < > = values in this interval not displayed.     Estimated Creatinine Clearance: 66.8 mL/min (A) (by C-G formula based on SCr of 1.4 mg/dL (H)).  Assessment: 61 yo male s/p HM3 LVAD 11/14 and h/o Aflutter, for heparin.   Goal of Therapy:  Heparin level 0.3-0.5 units/ml aPTT 65-85 seconds  INR 2-3 Monitor platelets by anticoagulation protocol: Yes   Plan:  Increase Heparin 1050 units/hr Follow-up am labs.  Phillis Knack, PharmD, BCPS

## 2022-07-08 NOTE — Progress Notes (Signed)
ANTICOAGULATION CONSULT NOTE   Pharmacy Consult for heparin Indication:  LVAD  No Known Allergies  Patient Measurements: Height: 5\' 11"  (180.3 cm) Weight: 94 kg (207 lb 3.7 oz) IBW/kg (Calculated) : 75.3 Heparin Dosing Weight: 95 kg  Vital Signs: Temp: 98.2 F (36.8 C) (11/18 1000) Temp Source: Core (11/18 0400) BP: 139/94 (11/18 0800) Pulse Rate: 91 (11/18 1000)  Labs: Recent Labs    07/06/22 0414 07/06/22 1153 07/07/22 0354 07/07/22 0510 07/07/22 1557 07/07/22 2325 07/08/22 0408 07/08/22 0421 07/08/22 0900  HGB 10.3*   < > 11.4* 11.6*  --   --  10.3* 10.9*  --   HCT 31.3*   < > 33.1* 34.0*  --   --  29.6* 32.0*  --   PLT 199  --  222  --   --   --  220  --   --   APTT 37*   < > 40*  --  37* 41*  --   --  45*  LABPROT 18.0*  --  17.2*  --   --   --   --   --  18.3*  INR 1.5*  --  1.4*  --   --   --   --   --  1.5*  HEPARINUNFRC <0.10*   < > <0.10*  --  <0.10* <0.10*  --   --  <0.10*  CREATININE 1.51*   < > 1.37*  --  1.40*  --  1.27*  --   --    < > = values in this interval not displayed.     Estimated Creatinine Clearance: 73.3 mL/min (A) (by C-G formula based on SCr of 1.27 mg/dL (H)).   Medical History: Past Medical History:  Diagnosis Date   Acute kidney injury ([place])    Acute respiratory failure (HCC)    Atrial flutter (HCC)    on Eliquis   CHF (congestive heart failure) ([place])    EF 20-25% 10/15/21, AICD in place.   Coronary artery disease    Diabetes mellitus without complication (HCC)    DVT (deep venous thrombosis) ([place]) 06/2019   Heart murmur    Hypertension    Presence of permanent cardiac pacemaker    Renal disorder     Medications:  Scheduled:   sodium chloride   Intravenous Once   acetaminophen  1,000 mg Oral Q6H   Or   acetaminophen (TYLENOL) oral liquid 160 mg/5 mL  1,000 mg Per Tube Q6H   aspirin  81 mg Oral Daily   atorvastatin  20 mg Oral Daily   bisacodyl  10 mg Oral Daily   Or   bisacodyl  10 mg Rectal Daily    Chlorhexidine Gluconate Cloth  6 each Topical Daily   digoxin  0.125 mg Oral Daily   feeding supplement  237 mL Oral TID BM   insulin aspart  0-24 Units Subcutaneous Q4H   insulin glargine-yfgn  35 Units Subcutaneous Daily   ipratropium-albuterol  3 mL Nebulization Q6H   levothyroxine  25 mcg Oral Q0600   lidocaine  2 patch Transdermal Q24H   magnesium oxide  400 mg Oral BID   melatonin  3 mg Oral QHS   multivitamin with minerals  1 tablet Oral Daily   pantoprazole  40 mg Oral Daily   polyethylene glycol  17 g Oral BID   potassium chloride  20 mEq Oral Q4H   senna-docusate  1 tablet Oral BID   sertraline  50 mg Oral Daily  sildenafil  20 mg Oral TID   sodium chloride flush  10-40 mL Intracatheter Q12H   sodium chloride flush  10-40 mL Intracatheter Q12H   sodium chloride flush  3 mL Intravenous Q12H   sodium chloride flush  3 mL Intravenous Q12H   sorbitol  45 mL Oral Once   spironolactone  12.5 mg Oral Daily   warfarin  5 mg Oral ONCE-1600   Warfarin - Physician Dosing Inpatient   Does not apply q1600    Assessment: 61 yom admitted with HF now s/p HM3 LVAD with LAA clip on 11/14. On apixaban PTA for hx Aflutter (LD 11/6 PTA, now 11/9) - heparin level came back undetectable 11/15 so will not impact levels moving forward. Discussed with Dr. Tenny Craw. Will monitor aPTT and heparin level and slowly adjust to target lower end of therapeutic range (0.3 for heparin level, 65 for aPTT).   Warfarin started yesterday, INR 1.5. Heparin level <0.1, aPTT is 45 - both subtherapeutic. Heparin paused briefly for chest tube removal (after levels drawn), now resumed.   Goal of Therapy:  Heparin level 0.3-0.5 units/ml aPTT 65-85 seconds  INR 2-3 Monitor platelets by anticoagulation protocol: Yes   Plan:  Increase heparin infusion to 1150 units/hr  Repeat heparin level and aPTT in 6h  [name], PharmD, [place], [facility] Clinical Pharmacist [phone] Please check AMION for all [place]  numbers 07/08/2022

## 2022-07-08 NOTE — Progress Notes (Signed)
NAME:  Darryl Frye, MRN:  [id number], DOB:  01-29-1961, LOS: 46 ADMISSION DATE:  06/27/2022, CONSULTATION DATE:  11/14 REFERRING MD:  Aundra Dubin, CHIEF COMPLAINT:  critical care support s/p LVAD   History of Present Illness:  61 year old male w/ sig h/o NICM (EF 20-25%), af/flutter. Presented 11/14 for LVAD after failing out-pt medical therapies.   Initial flow 4.5 l/min speed 5100 power 3.6 P1 2.3  Pertinent  Medical History  Afib/flutter s/p ablation May 2023 NICM EF 20-25% has ICD Remote h/o heavy ETOH and cocaine Class IV NYHF sxs.   Significant Hospital Events: Including procedures, antibiotic start and stop dates in addition to other pertinent events   11/14 LVAD placed. Returned to ICU full vent support. On iNO 20ppm, Returned to ICU post-op intubated on Neo, epi 6, and amio gtt. Initial hemodynamics PA 34/20 CO 4.9 CI 2.4 co ox 70% l CVP 10-. On full vent support. PCCM asked to assist w/ care  11/15 iNO weaned off. Got lasix. Pump speed increased to 5200. Still on epi and milrione. Awake following commands 11/15 attempted SBT but saturations dropped 11/16 extubated  11/17 mobilizations, remains on pressors   Interim History / Subjective:   Patient remains critically ill.  On vasopressors postop LVAD. Was in rapid A-fib yesterday and given additional boluses of amiodarone.  Objective   Blood pressure 103/70, pulse (!) 115, temperature 98.4 F (36.9 C), resp. rate (!) 24, height 5\' 11"  (1.803 m), weight 94 kg, SpO2 96 %. PAP: (28-52)/(18-39) 34/25 CVP:  [6 mmHg-29 mmHg] 11 mmHg CO:  [4.3 L/min-4.9 L/min] 4.5 L/min CI:  [2.1 L/min/m2-2.3 L/min/m2] 2.1 L/min/m2  FiO2 (%):  [60 %-80 %] 60 %   Intake/Output Summary (Last 24 hours) at 07/08/2022 0716 Last data filed at 07/08/2022 0700 Gross per 24 hour  Intake 3672.14 ml  Output 6310 ml  Net -2637.86 ml   Filed Weights   07/06/22 0500 07/07/22 0500 07/08/22 0500  Weight: 94.7 kg 94.9 kg 94 kg   Examination: General:  Middle-aged male resting in bed, more somnolent this morning did just receive pain medications HENT NCAT, tracking appropriately, right internal jugular line Swan-Ganz catheter in place Heart: Positive mechanical hum from LVAD, midline sternal incision Pulm: Diminished breath sounds bilaterally Abd: Soft, nontender, nondistended Ext: Warm dry Neuro: Alert following commands GU: Clear urine in Foley bag  Ancillary tests personally reviewed:     Assessment & Plan:   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 We will touch base today and decide whether or not they felt like this is going to be necessary.  Post-operative coagulapathy  Plan Continue to observe, correct as indicated No sign of bleeding  Acute Hypoxemic Respiratory Failure  S/p extubation  P; Patient likely has severe basilar atelectasis and hypoventilation from his chest surgery and drains still in place. Continue mobilization I-S and flutter Continue PT OT  H/o afib/flutter s/p prior ablation -previously on DOAC, currently in NSR Cont tele P: Continue amiodarone We will discuss with heart failure service whether or not they felt like he needs to have cardioversion at some point Heart rate seems to be better controlled this morning.  H/o DVT Bridging from heparin to Coumadin.  Best Practice (right click and "Reselect all SmartList Selections" daily)   Diet/type: NPO  Consider tube feeds if not extubated tomorrow.  DVT prophylaxis: SCD - fixed dose heparin, start warfarin once extubated.  GI prophylaxis: H2B Lines: Central line Foley:  Yes, and it is still needed Code Status:  limited Last date of multidisciplinary goals of care discussion [NA]   This patient is  critically ill with multiple organ system failure; which, requires frequent high complexity decision making, assessment, support, evaluation, and titration of therapies. This was completed through the application of advanced monitoring technologies and extensive interpretation of multiple databases. During this encounter critical care time was devoted to patient care services described in this note for 33 minutes.  [facility] 07/08/2022 7:17 AM

## 2022-07-09 DIAGNOSIS — R570 Cardiogenic shock: Secondary | ICD-10-CM | POA: Diagnosis not present

## 2022-07-09 LAB — BASIC METABOLIC PANEL
Anion gap: 14 (ref 5–15)
Anion gap: 14 (ref 5–15)
Anion gap: 9 (ref 5–15)
BUN: 25 mg/dL — ABNORMAL HIGH (ref 6–20)
BUN: 22 mg/dL — ABNORMAL HIGH (ref 6–20)
BUN: 23 mg/dL — ABNORMAL HIGH (ref 6–20)
CO2: 26 mmol/L (ref 22–32)
CO2: 26 mmol/L (ref 22–32)
CO2: 26 mmol/L (ref 22–32)
Calcium: 8.2 mg/dL — ABNORMAL LOW (ref 8.9–10.3)
Calcium: 8.2 mg/dL — ABNORMAL LOW (ref 8.9–10.3)
Calcium: 8.2 mg/dL — ABNORMAL LOW (ref 8.9–10.3)
Chloride: 96 mmol/L — ABNORMAL LOW (ref 98–111)
Chloride: 95 mmol/L — ABNORMAL LOW (ref 98–111)
Chloride: 99 mmol/L (ref 98–111)
Creatinine, Ser: 1.18 mg/dL (ref 0.61–1.24)
Creatinine, Ser: 1.08 mg/dL (ref 0.61–1.24)
Creatinine, Ser: 1.05 mg/dL (ref 0.61–1.24)
GFR, Estimated: >60 mL/min (ref >60)
GFR, Estimated: >60 mL/min (ref >60)
GFR, Estimated: >60 mL/min (ref >60)
Glucose, Bld: 126 mg/dL — ABNORMAL HIGH (ref 70–99)
Glucose, Bld: 118 mg/dL — ABNORMAL HIGH (ref 70–99)
Glucose, Bld: 86 mg/dL (ref 70–99)
Potassium: 3 mmol/L — ABNORMAL LOW (ref 3.5–5.1)
Potassium: 3 mmol/L — ABNORMAL LOW (ref 3.5–5.1)
Potassium: 3.5 mmol/L (ref 3.5–5.1)
Sodium: 136 mmol/L (ref 135–145)
Sodium: 135 mmol/L (ref 135–145)
Sodium: 134 mmol/L — ABNORMAL LOW (ref 135–145)

## 2022-07-09 LAB — GLUCOSE, CAPILLARY
Glucose-Capillary: 115 mg/dL — ABNORMAL HIGH (ref 70–99)
Glucose-Capillary: 116 mg/dL — ABNORMAL HIGH (ref 70–99)
Glucose-Capillary: 174 mg/dL — ABNORMAL HIGH (ref 70–99)
Glucose-Capillary: 205 mg/dL — ABNORMAL HIGH (ref 70–99)
Glucose-Capillary: 243 mg/dL — ABNORMAL HIGH (ref 70–99)
Glucose-Capillary: 361 mg/dL — ABNORMAL HIGH (ref 70–99)
Glucose-Capillary: 54 mg/dL — ABNORMAL LOW (ref 70–99)

## 2022-07-09 LAB — POCT I-STAT 7, (LYTES, BLD GAS, ICA,H+H)
Acid-Base Excess: 4 mmol/L — ABNORMAL HIGH (ref 0.0–2.0)
Bicarbonate: 27.7 mmol/L (ref 20.0–28.0)
Calcium, Ion: 1.1 mmol/L — ABNORMAL LOW (ref 1.15–1.40)
HCT: 28 % — ABNORMAL LOW (ref 39.0–52.0)
Hemoglobin: 9.5 g/dL — ABNORMAL LOW (ref 13.0–17.0)
O2 Saturation: 98 %
Patient temperature: 99
Potassium: 2.9 mmol/L — ABNORMAL LOW (ref 3.5–5.1)
Sodium: 134 mmol/L — ABNORMAL LOW (ref 135–145)
TCO2: 29 mmol/L (ref 22–32)
pCO2 arterial: 35.6 mmHg (ref 32–48)
pH, Arterial: 7.5 — ABNORMAL HIGH (ref 7.35–7.45)
pO2, Arterial: 93 mmHg (ref 83–108)

## 2022-07-09 LAB — CBC WITH DIFFERENTIAL/PLATELET
Abs Immature Granulocytes: 0.07 10*3/uL (ref 0.00–0.07)
Basophils Absolute: 0 10*3/uL (ref 0.0–0.1)
Basophils Relative: 0 %
Eosinophils Absolute: 0.2 10*3/uL (ref 0.0–0.5)
Eosinophils Relative: 2 %
HCT: 26.2 % — ABNORMAL LOW (ref 39.0–52.0)
Hemoglobin: 9.4 g/dL — ABNORMAL LOW (ref 13.0–17.0)
Immature Granulocytes: 1 %
Lymphocytes Relative: 6 %
Lymphs Abs: 0.7 10*3/uL (ref 0.7–4.0)
MCH: 29 pg (ref 26.0–34.0)
MCHC: 35.9 g/dL (ref 30.0–36.0)
MCV: 80.9 fL (ref 80.0–100.0)
Monocytes Absolute: 1.2 10*3/uL — ABNORMAL HIGH (ref 0.1–1.0)
Monocytes Relative: 10 %
Neutro Abs: 9.3 10*3/uL — ABNORMAL HIGH (ref 1.7–7.7)
Neutrophils Relative %: 81 %
Platelets: 211 10*3/uL (ref 150–400)
RBC: 3.24 MIL/uL — ABNORMAL LOW (ref 4.22–5.81)
RDW: 14.7 % (ref 11.5–15.5)
WBC: 11.5 10*3/uL — ABNORMAL HIGH (ref 4.0–10.5)
nRBC: 0.9 % — ABNORMAL HIGH (ref 0.0–0.2)

## 2022-07-09 LAB — HEPARIN LEVEL (UNFRACTIONATED): Heparin Unfractionated: <0.1 IU/mL — ABNORMAL LOW (ref 0.30–0.70)

## 2022-07-09 LAB — LACTATE DEHYDROGENASE: LDH: 387 U/L — ABNORMAL HIGH (ref 98–192)

## 2022-07-09 LAB — VANCOMYCIN, TROUGH: Vancomycin Tr: 18 ug/mL (ref 15–20)

## 2022-07-09 LAB — APTT: aPTT: 53 seconds — ABNORMAL HIGH (ref 24–36)

## 2022-07-09 LAB — PROTIME-INR
INR: 3 — ABNORMAL HIGH (ref 0.8–1.2)
Prothrombin Time: 31.2 seconds — ABNORMAL HIGH (ref 11.4–15.2)

## 2022-07-09 LAB — MAGNESIUM: Magnesium: 1.9 mg/dL (ref 1.7–2.4)

## 2022-07-09 LAB — COOXEMETRY PANEL
Carboxyhemoglobin: 2 % — ABNORMAL HIGH (ref 0.5–1.5)
Methemoglobin: <0.7 % (ref 0.0–1.5)
O2 Saturation: 66.3 %
Total hemoglobin: 11.1 g/dL — ABNORMAL LOW (ref 12.0–16.0)

## 2022-07-09 LAB — PHOSPHORUS: Phosphorus: 2.1 mg/dL — ABNORMAL LOW (ref 2.5–4.6)

## 2022-07-09 MED ORDER — SPIRONOLACTONE 25 MG PO TABS
25.0000 mg | ORAL_TABLET | Freq: Every day | ORAL | Status: DC
  Administered 2022-07-10 – 2022-07-21 (×12): 25 mg via ORAL
  Filled 2022-07-09 (×12): qty 1

## 2022-07-09 MED ORDER — POTASSIUM CHLORIDE CRYS ER 20 MEQ PO TBCR: 20.0000 meq | EXTENDED_RELEASE_TABLET | ORAL | Status: DC

## 2022-07-09 MED ORDER — POTASSIUM CHLORIDE CRYS ER 20 MEQ PO TBCR
40.0000 meq | EXTENDED_RELEASE_TABLET | ORAL | Status: AC
  Administered 2022-07-09 (×2): 40 meq via ORAL
  Filled 2022-07-09 (×2): qty 2

## 2022-07-09 MED ORDER — POTASSIUM CHLORIDE CRYS ER 20 MEQ PO TBCR
40.0000 meq | EXTENDED_RELEASE_TABLET | Freq: Once | ORAL | Status: AC
  Administered 2022-07-09: 40 meq via ORAL
  Filled 2022-07-09: qty 2

## 2022-07-09 MED ORDER — POTASSIUM CHLORIDE CRYS ER 20 MEQ PO TBCR
20.0000 meq | EXTENDED_RELEASE_TABLET | ORAL | Status: AC
  Administered 2022-07-09 – 2022-07-10 (×3): 20 meq via ORAL
  Filled 2022-07-09 (×3): qty 1

## 2022-07-09 NOTE — Progress Notes (Addendum)
Patient ID: Darryl Frye, male   DOB: 1961/06/16, 61 y.o.   MRN: [id number]   Advanced Heart Failure VAD Team Note  PCP-Cardiologist: Carlyle Dolly, MD   Subjective:    - 11/13 S/P HMIII L atrial appendage closure.  - 11/16 Extubated . GIven 40 mg IV and started on lasix drip 10 mg per/hr.  - 11/17 Developed A fib RVR. Given bolus 150 x 2. Unchanged.  - 11/18 Bedside echo 11/18 LV small. VAD cannula well aligned. AoV opening every beat. Septum pulling to left RV moderately HK. Small to moderate anterior effusion  Milrinone increased to 0.375 yesterday for elevated SVR/PVR  On Epi 1 for RV support Co-ox 66% CVP 16. Swan out  6L out with lasix gtt at 10. Weight down a pound. Scr normalized. Multiple BMs with sorbitol  Remains in NSR on amio gtt. INR up to 3.1. Heparin stopped.   Remains on vanc/zosyn. AF.    LVAD INTERROGATION:  HeartMate III LVAD:   Flow 3.8 liters/min, speed 5300, power 4.0, PI  4.4  Objective:    Vital Signs:   Temp:  [97.6 F (36.4 C)-99.8 F (37.7 C)] 99.3 F (37.4 C) (11/19 0400) Pulse Rate:  [78-125] 90 (11/19 0900) Resp:  [19-32] 25 (11/19 0900) BP: (101-106)/(69-79) 101/72 (11/19 0846) SpO2:  [75 %-100 %] 95 % (11/19 0900) Arterial Line BP: (88-121)/(64-92) 111/67 (11/19 0900) FiO2 (%):  [40 %-60 %] 50 % (11/19 0846) Weight:  [93.7 kg] 93.7 kg (11/19 0500) Last BM Date : 07/01/22 Mean arterial Pressure 80-90s  Intake/Output:   Intake/Output Summary (Last 24 hours) at 07/09/2022 0959 Last data filed at 07/09/2022 0900 Gross per 24 hour  Intake 3674.65 ml  Output 5700 ml  Net -2025.35 ml      Physical Exam   General:  Sitting in chair. No resp difficulty HEENT: normal  Neck: supple. RIJ introducer .  Carotids 2+ bilat; no bruits. No lymphadenopathy or thryomegaly appreciated. Cor: LVAD hum.  Lungs: Decreased at bases  Abdomen: obese soft, nontender, non-distended. No hepatosplenomegaly. No bruits or masses. Good bowel sounds.  Driveline site clean. Anchor in place.  Extremities: no cyanosis, clubbing, rash. Warm no 1+ edema  Neuro: alert & oriented x 3. No focal deficits. Moves all 4 without problem    Telemetry   NSR 90s Personally reviewed  Labs   Basic Metabolic Panel: Recent Labs  Lab 07/05/22 0401 07/05/22 0411 07/06/22 0414 07/06/22 1153 07/06/22 1519 07/06/22 1718 07/07/22 0354 07/07/22 0510 07/07/22 1557 07/08/22 0408 07/08/22 0421 07/08/22 1833 07/09/22 0259 07/09/22 0558 07/09/22 0608  NA 136   < > 138   < > 139   < > 137   < > 135 136 135 136 136 135 134*  K 5.3*   < > 4.3   < > 3.8   < > 3.8   < > 3.7 3.0* 2.9* 3.3* 3.0* 3.0* 2.9*  CL 107   < > 109   < > 106   < > 102  --  101 97*  --  96* 96* 95*  --   CO2 21*   < > 21*   < > 20*   < > 21*  --  22 23  --  23 26 26   --   GLUCOSE 93   < > 150*   < > 174*   < > 206*  --  180* 151*  --  206* 126* 118*  --   BUN 18   < >  24*   < > 26*   < > 28*  --  30* 25*  --  29* 25* 22*  --   CREATININE 1.23   < > 1.51*   < > 1.51*   < > 1.37*  --  1.40* 1.27*  --  1.35* 1.18 1.08  --   CALCIUM 8.0*   < > 8.3*   < > 8.3*   < > 8.4*  --  8.3* 8.4*  --  8.5* 8.2* 8.2*  --   MG 2.7*   < > 2.7*   < > 2.4  --  2.3  --  2.2 2.0  --   --  1.9  --   --   PHOS 2.7  --  3.4  --   --   --  3.5  --   --  3.3  --   --  2.1*  --   --    < > = values in this interval not displayed.     Liver Function Tests: Recent Labs  Lab 07/05/22 0401 07/06/22 0414 07/07/22 0354  AST 90* 102* 57*  ALT 25 27 21   ALKPHOS 70 74 89  BILITOT 0.8 0.6 0.7  PROT 6.5 6.4* 6.6  ALBUMIN 3.6 3.1* 2.9*    No results for input(s): "LIPASE", "AMYLASE" in the last 168 hours. No results for input(s): "AMMONIA" in the last 168 hours.  CBC: Recent Labs  Lab 07/05/22 0401 07/05/22 0411 07/05/22 1738 07/05/22 1742 07/06/22 0414 07/06/22 1518 07/07/22 0354 07/07/22 0510 07/08/22 0408 07/08/22 0421 07/09/22 0259 07/09/22 0608  WBC 12.6*  --  16.5*  --  18.0*  --  20.9*   --  17.4*  --  11.5*  --   NEUTROABS 10.6*  --   --   --  15.1*  --  18.6*  --  15.2*  --  9.3*  --   HGB 10.8*   < > 10.6*   < > 10.3*   < > 11.4* 11.6* 10.3* 10.9* 9.4* 9.5*  HCT 33.1*   < > 32.1*   < > 31.3*   < > 33.1* 34.0* 29.6* 32.0* 26.2* 28.0*  MCV 87.6  --  84.9  --  85.8  --  83.6  --  82.0  --  80.9  --   PLT 213  --  209  --  199  --  222  --  220  --  211  --    < > = values in this interval not displayed.     INR: Recent Labs  Lab 07/05/22 0401 07/06/22 0414 07/07/22 0354 07/08/22 0900 07/09/22 0259  INR 1.1 1.5* 1.4* 1.5* 3.0*   :    Imaging   DG Chest Port 1 View  Result Date: 07/08/2022 CLINICAL DATA:  LEFT ventricular assist device. EXAM: PORTABLE CHEST 1 VIEW COMPARISON:  07/07/2022 and prior studies FINDINGS: Cardiomegaly, median sternotomy wires, LEFT atrial clip, LEFT subclavian pacemaker, mediastinal and thoracostomy tubes, RIGHT PICC line with tip difficult to visualize, and RIGHT IJ Swan-Ganz catheter with tip overlying the pulmonary outflow tract/main pulmonary artery again noted. An LVAD is unchanged. Bilateral interstitial and airspace opacities and scattered atelectasis again noted. There is no evidence of pneumothorax or large pleural effusion. IMPRESSION: Unchanged appearance of the chest with bilateral interstitial/airspace opacities and scattered atelectasis. Support apparatus as described. No evidence of pneumothorax. Electronically Signed   By: Margarette Canada M.D.   On: 07/08/2022 08:16   DG  CHEST PORT 1 VIEW  Result Date: 07/07/2022 CLINICAL DATA:  PICC EXAM: PORTABLE CHEST 1 VIEW COMPARISON:  Same day chest radiograph FINDINGS: Interval placement of a right arm PICC with the tip visualized to the level of the brachiocephalic vein. The distal portion of the PICC/the tip of the PICC is poorly visualized due to superimposition of the right IJ central venous sheath and PA catheter. Within this limitation, the PICC is likely positioned in the mid to  upper SVC. Left-sided dual lead cardiac device in place, unchanged in positioning. There is an LVAD in place. Atrial appendage occluded device is also in place. Status post median sternotomy. Bilateral thoracostomy tubes iin place. No pneumothorax. No pleural effusion. Unchanged enlarged cardiac contours. Persistent bibasilar pulmonary opacities with improved aeration of the right lung base. No new focal airspace opacities visualized. There are bilateral prominent interstitial opacities, favored to represent pulmonary edema. IMPRESSION: 1. Interval placement of a right arm PICC with the tip visualized to the level of the brachiocephalic vein. The distal portion of the PICC/the tip of the PICC is poorly visualized due to superimposition of the right IJ central venous sheath and PA catheter. Within this limitation, the tip of the PICC is likely positioned in the mid to upper SVC. 2. Persistent bibasilar pulmonary opacities with improved aeration of the right lung base Electronically Signed   By: Marin Roberts M.D.   On: 07/07/2022 16:25     Medications:     Scheduled Medications:  sodium chloride   Intravenous Once   acetaminophen  1,000 mg Oral Q6H   Or   acetaminophen (TYLENOL) oral liquid 160 mg/5 mL  1,000 mg Per Tube Q6H   aspirin  81 mg Oral Daily   atorvastatin  20 mg Oral Daily   bisacodyl  10 mg Oral Daily   Or   bisacodyl  10 mg Rectal Daily   Chlorhexidine Gluconate Cloth  6 each Topical Daily   digoxin  0.125 mg Oral Daily   feeding supplement  237 mL Oral TID BM   insulin aspart  0-24 Units Subcutaneous Q4H   insulin glargine-yfgn  35 Units Subcutaneous Daily   ipratropium-albuterol  3 mL Nebulization Q6H   levothyroxine  25 mcg Oral Q0600   lidocaine  2 patch Transdermal Q24H   magnesium oxide  400 mg Oral BID   melatonin  3 mg Oral QHS   multivitamin with minerals  1 tablet Oral Daily   pantoprazole  40 mg Oral Daily   polyethylene glycol  17 g Oral BID   potassium chloride   20 mEq Oral Q4H   senna-docusate  1 tablet Oral BID   sertraline  50 mg Oral Daily   sildenafil  20 mg Oral TID   sodium chloride flush  10-40 mL Intracatheter Q12H   sodium chloride flush  10-40 mL Intracatheter Q12H   sodium chloride flush  3 mL Intravenous Q12H   sodium chloride flush  3 mL Intravenous Q12H   spironolactone  12.5 mg Oral Daily   Warfarin - Physician Dosing Inpatient   Does not apply q1600    Infusions:  sodium chloride Stopped (07/07/22 1346)   sodium chloride     sodium chloride     amiodarone 60 mg/hr (07/09/22 0900)   ceFEPime (MAXIPIME) IV Stopped (07/09/22 0626)   epinephrine 1 mcg/min (07/09/22 0900)   furosemide (LASIX) 200 mg in dextrose 5 % 100 mL (2 mg/mL) infusion 10 mg/hr (07/09/22 0900)   lactated ringers  lactated ringers Stopped (07/04/22 2207)   lactated ringers 20 mL/hr at 07/09/22 0900   milrinone 0.375 mcg/kg/min (07/09/22 0900)   norepinephrine (LEVOPHED) Adult infusion Stopped (07/06/22 0221)   vancomycin 200 mL/hr at 07/09/22 0900    PRN Medications: sodium chloride, acetaminophen, ALPRAZolam, dextrose, hydrALAZINE, lactated ringers, morphine injection, ondansetron (ZOFRAN) IV, ondansetron (ZOFRAN) IV, mouth rinse, oxyCODONE, sodium chloride flush, sodium chloride flush, sodium chloride flush, traMADol   Patient Profile   Admitted with A/C HFrEF --> cardiogenic shock. S/P HMIII LVAD  Assessment/Plan:    1. Acute on chronic systolic CHF/cardiogenic shock: Nonischemic cardiomyopathy, diagnosed 2020.  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  Cath in 12/20 with no significant coronary disease.  [employer] ICD. Most recent echo in 2/23 showed EF 20-25% with normal RV.  Recently, patient has been symptomatically worse, NYHA class IV with profound orthopnea. RHC  showed markedly elevated filling pressures, primarily pulmonary venous hypertension, low cardiac output, and low PAPI.  Patient was admitted for inotrope and diuresis. Echo this admission with EF <20%, mild RV dysfunction.  He was started on milrinone with improvement in hemodynamics.  We proceeded with HM3 LVAD with LAA clip on 11/14.  This morning, he is on epinephrine 1, milrinone 0.375, sildenafil 20 tid.  Co-ox 66% CVP 16 - Continue milrinone 0.375 mcg. Continue EPI @ 1 (for RV support. PAPI qas 1.2 yesterday before swan out) - Volume improving LV well unloaded on echo. Still volume overloaded. Continue lasix gtt @ 10 for now - Continue digoxin 0.125 daily.  - Continue sildenafil 20 tid.  2. A fib RVR : H/O S/p flutter ablation.   -Back in NSR. Continue IV amio -On warfarin INR 3.1. Heparin stopped 3. VAD management -  s/p HM-3 VAD on 11/14 - bedside echo 11/18 LV small. VAD cannula well aligned. AOv opening every beat. Septum pulling to left RV moderately HK. Small to moderate anterior effusion - INR 3.1 Warfarin managed by TCTS - VAD interrogated personally. Parameters stable. - LDH 462 -> 412 -> 387 - MAPs improved. Meds as above 4. H/o DVT: On AC 5. Acute hypoxemic respiratory failure, post-op - 11/16 Extubated  - Volume still up. Continue diuresis 6. AKI: Creatinine improved 1.4 -> 1.2 -> 1.0, follow closely with diuresis.  7. ID: Low grade fever and leukocytosis post-op. Tmax AF 99.8 - on vanc/cefepime 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  9. Hypokalemia - K 3.0 will supp - increase spiro to 25 10. Deconditioning - PT/OT - ambulate   I reviewed the LVAD parameters from today, and compared the results to the patient's prior recorded data.  No programming changes were made.  The LVAD is functioning within specified parameters.  The patient performs LVAD self-test daily.  LVAD interrogation was negative for any significant power changes, alarms or PI events/speed drops.  LVAD  equipment check completed and is in good working order.  Back-up equipment present.   LVAD education done on emergency procedures and precautions and reviewed exit site care.  CRITICAL CARE Performed by: Glori Bickers  Total critical care time: 40 minutes  Critical care time was exclusive of separately billable procedures and treating other patients.  Critical care was necessary to treat or prevent imminent or life-threatening deterioration.  Critical care was time spent personally by me (independent of midlevel providers or residents)  on the following activities: development of treatment plan with patient and/or surrogate as well as nursing, discussions with consultants, evaluation of patient's response to treatment, examination of patient, obtaining history from patient or surrogate, ordering and performing treatments and interventions, ordering and review of laboratory studies, ordering and review of radiographic studies, pulse oximetry and re-evaluation of patient's condition.    Length of Stay: [place], MD 07/09/2022, 9:59 AM  VAD Team --- VAD ISSUES ONLY--- Pager [phone] (7am - 7am) Advanced Heart Failure Team  Pager [phone] (M-F; 7a - 5p)  Please contact [facility] for night-coverage after hours (5p -7a ) and weekends on [email or web address]

## 2022-07-09 NOTE — Progress Notes (Signed)
Inpatient Rehab Admissions Coordinator Note:   Per therapy patient was screened for CIR candidacy by Atsushi Yom Danford Bad, CCC-SLP. At this time, pt appears to be a potential candidate for CIR. I will place an order for rehab consult for full assessment, per our protocol.  Please contact me any with questions.Gayland Curry, [place], [place] Admissions Coordinator [phone] 07/09/22 5:08 PM

## 2022-07-09 NOTE — Progress Notes (Signed)
Pharmacy Antibiotic Note  Darryl Frye is a 61 y.o. male admitted on 06/27/2022 s/p LVAD and LAA closure 11/14 with new concern for pneumonia. WBCs are trending up at 20.9 and Tmax of 100.6. Extubated 11/16 PM but remains on 80% HFNC. Pharmacy has been consulted for vancomycin and cefepime dosing.   Cr continues to improve post-op. WBC normalizing, afebrile. Vancomycin trough is 18 mcg/ml but drawn early, true trough closer to 15 mcg/ml.  Plan: Continue vancomycin 1000mg  IV q12h Continue cefepime 2g IV q8h Follow LOT  Height: 5\' 11"  (180.3 cm) Weight: 93.7 kg (206 lb 9.1 oz) IBW/kg (Calculated) : 75.3  Temp (24hrs), Avg:98.5 F (36.9 C), Min:97.6 F (36.4 C), Max:99.8 F (37.7 C)  Recent Labs  Lab 07/05/22 1738 07/06/22 0414 07/06/22 1153 07/07/22 0354 07/07/22 1557 07/08/22 0408 07/08/22 1833 07/09/22 0259 07/09/22 0413 07/09/22 0558  WBC 16.5* 18.0*  --  20.9*  --  17.4*  --  11.5*  --   --   CREATININE 1.44* 1.51*   < > 1.37* 1.40* 1.27* 1.35* 1.18  --  1.08  VANCOTROUGH  --   --   --   --   --   --   --   --  18  --    < > = values in this interval not displayed.     Estimated Creatinine Clearance: 86.1 mL/min (by C-G formula based on SCr of 1.08 mg/dL).    No Known Allergies   [name], PharmD, BCPS, [facility] Clinical Pharmacist [phone] Please check AMION for all [place] numbers 07/09/2022

## 2022-07-09 NOTE — Progress Notes (Signed)
Metaneb not done at this time, patient is asleep.

## 2022-07-09 NOTE — Progress Notes (Signed)
Drive line dressing change:    LVAD driveline dressing was changed using proper sterile technique. Old dressing removed. Driveline site cleaned using chloroprep x2. Silver strip applied. New gauze dressing applied using sterile technique. Driveline secured at driveline site with suture. Driveline site clean, no drainage from site, no redness, no swelling, no tenderness, no odor. Driveline anchor replaced. New anchor intact and applied correctly.    NEXT DRESSING CHANGE DUE 07/10/2022   Alma Friendly RN

## 2022-07-09 NOTE — Progress Notes (Signed)
Drive line dressing change:   LVAD driveline dressing was changed using proper sterile technique. Old dressing removed. Driveline site cleaned using chloroprep x2. Silver strip applied. New gauze dressing applied using sterile technique. Driveline secured at driveline site with suture. Driveline site clean, minimal sanguinous drainage from site, no redness, no swelling, no tenderness, no odor from drainage. Driveline anchor replaced. New anchor intact and applied correctly.   NEXT DRESSING CHANGE DUE 07/09/2022  Alma Friendly RN

## 2022-07-09 NOTE — Progress Notes (Signed)
NAME:  Darryl Frye, MRN:  [id number], DOB:  11/15/1960, LOS: 12 ADMISSION DATE:  06/27/2022, CONSULTATION DATE:  11/14 REFERRING MD:  Aundra Dubin, CHIEF COMPLAINT:  critical care support s/p LVAD   History of Present Illness:  61 year old male w/ sig h/o NICM (EF 20-25%), af/flutter. Presented 11/14 for LVAD after failing out-pt medical therapies. Initial flow 4.5 l/min speed 5100 power 3.6 P1 2.3  Pertinent  Medical History  Afib/flutter s/p ablation May 2023 NICM EF 20-25% has ICD Remote h/o heavy ETOH and cocaine Class IV NYHF sxs.   Significant Hospital Events: Including procedures, antibiotic start and stop dates in addition to other pertinent events   11/14 LVAD placed. Returned to ICU full vent support. On iNO 20ppm, Returned to ICU post-op intubated on Neo, epi 6, and amio gtt. Initial hemodynamics PA 34/20 CO 4.9 CI 2.4 co ox 70% l CVP 10-. On full vent support. PCCM asked to assist w/ care  11/15 iNO weaned off. Got lasix. Pump speed increased to 5200. Still on epi and milrione. Awake following commands 11/15 attempted SBT but saturations dropped 11/16 extubated  11/17 mobilizations, remains on pressors   Interim History / Subjective:   Patient remains critically ill on vasopressors. LVAD in place.  Objective   Blood pressure 101/72, pulse 97, temperature 99.3 F (37.4 C), temperature source Oral, resp. rate (!) 24, height 5\' 11"  (1.803 m), weight 93.7 kg, SpO2 94 %. CVP:  [4 mmHg-17 mmHg] 4 mmHg  FiO2 (%):  [50 %-60 %] 50 %   Intake/Output Summary (Last 24 hours) at 07/09/2022 1212 Last data filed at 07/09/2022 1100 Gross per 24 hour  Intake 4100.96 ml  Output 5395 ml  Net -1294.04 ml   Filed Weights   07/07/22 0500 07/08/22 0500 07/09/22 0500  Weight: 94.9 kg 94 kg 93.7 kg   Examination: General: Middle-age male resting in chair, was able to get up to bedside commode, HENT NCAT, tracking appropriately Heart: Hum from LVAD, midline sternal incision Pulm:  Diminished breath sounds bile laterally no crackles Abd: Soft nontender nondistended Ext: Warm dry no edema Neuro: Alert following commands GU: Clear urine in Foley  Ancillary tests personally reviewed:     Assessment & Plan:   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 s/p LVAD HM3 Plan Vasopressors and milrinone per heart failure service Continue sildenafil digoxin Remains on amiodarone Overall seems to be progressing well.  Post-operative coagulapathy  Plan Continue to observe No sign of bleeding  Acute Hypoxemic Respiratory Failure  S/p extubation  P; Seems like he is doing better with mobilization. Up in chair today. Continue I-S flutter Continue PT OT.  H/o afib/flutter s/p prior ablation -previously on DOAC, currently in NSR Cont tele P: Amiodarone Heart rate control per cardiology.  H/o DVT Bridged to Coumadin from heparin.  Heparin stopped today INR 3.1. Appreciate pharmacy to see help.  Best Practice (right click and "Reselect all SmartList Selections" daily)   Diet/type: NPO  Consider tube feeds if not extubated tomorrow.  DVT prophylaxis: SCD - fixed dose heparin, start warfarin once extubated.  GI prophylaxis: H2B Lines: Central line Foley:  Yes, and it is still needed Code Status:  limited Last date of multidisciplinary goals of care discussion [NA]  This patient is critically ill with multiple organ system failure; which, requires frequent high complexity decision making, assessment, support, evaluation, and titration of therapies. This was completed through the application of advanced monitoring technologies and extensive interpretation of multiple  databases. During this encounter critical care time was devoted to patient care services described in this note for 32 minutes.   [place] [facility] 07/09/2022 12:12 PM

## 2022-07-10 ENCOUNTER — Inpatient Hospital Stay (HOSPITAL_COMMUNITY): Payer: Medicare Other

## 2022-07-10 DIAGNOSIS — I4891 Unspecified atrial fibrillation: Secondary | ICD-10-CM | POA: Diagnosis not present

## 2022-07-10 DIAGNOSIS — I5023 Acute on chronic systolic (congestive) heart failure: Secondary | ICD-10-CM | POA: Diagnosis not present

## 2022-07-10 DIAGNOSIS — R570 Cardiogenic shock: Secondary | ICD-10-CM | POA: Diagnosis not present

## 2022-07-10 DIAGNOSIS — I50811 Acute right heart failure: Secondary | ICD-10-CM

## 2022-07-10 DIAGNOSIS — Z95811 Presence of heart assist device: Secondary | ICD-10-CM

## 2022-07-10 DIAGNOSIS — I509 Heart failure, unspecified: Secondary | ICD-10-CM

## 2022-07-10 LAB — BASIC METABOLIC PANEL
Anion gap: 11 (ref 5–15)
Anion gap: 16 — ABNORMAL HIGH (ref 5–15)
BUN: 21 mg/dL — ABNORMAL HIGH (ref 6–20)
BUN: 19 mg/dL (ref 6–20)
CO2: 27 mmol/L (ref 22–32)
CO2: 26 mmol/L (ref 22–32)
Calcium: 8.5 mg/dL — ABNORMAL LOW (ref 8.9–10.3)
Calcium: 8.7 mg/dL — ABNORMAL LOW (ref 8.9–10.3)
Chloride: 97 mmol/L — ABNORMAL LOW (ref 98–111)
Chloride: 92 mmol/L — ABNORMAL LOW (ref 98–111)
Creatinine, Ser: 1 mg/dL (ref 0.61–1.24)
Creatinine, Ser: 1.1 mg/dL (ref 0.61–1.24)
GFR, Estimated: >60 mL/min (ref >60)
GFR, Estimated: >60 mL/min (ref >60)
Glucose, Bld: 146 mg/dL — ABNORMAL HIGH (ref 70–99)
Glucose, Bld: 187 mg/dL — ABNORMAL HIGH (ref 70–99)
Potassium: 3.7 mmol/L (ref 3.5–5.1)
Potassium: 3.8 mmol/L (ref 3.5–5.1)
Sodium: 135 mmol/L (ref 135–145)
Sodium: 134 mmol/L — ABNORMAL LOW (ref 135–145)

## 2022-07-10 LAB — POCT I-STAT 7, (LYTES, BLD GAS, ICA,H+H)
Acid-Base Excess: 5 mmol/L — ABNORMAL HIGH (ref 0.0–2.0)
Acid-Base Excess: 3 mmol/L — ABNORMAL HIGH (ref 0.0–2.0)
Bicarbonate: 27.2 mmol/L (ref 20.0–28.0)
Bicarbonate: 26.2 mmol/L (ref 20.0–28.0)
Calcium, Ion: 1.14 mmol/L — ABNORMAL LOW (ref 1.15–1.40)
Calcium, Ion: 1.09 mmol/L — ABNORMAL LOW (ref 1.15–1.40)
HCT: 30 % — ABNORMAL LOW (ref 39.0–52.0)
HCT: 28 % — ABNORMAL LOW (ref 39.0–52.0)
Hemoglobin: 10.2 g/dL — ABNORMAL LOW (ref 13.0–17.0)
Hemoglobin: 9.5 g/dL — ABNORMAL LOW (ref 13.0–17.0)
O2 Saturation: 91 %
O2 Saturation: 95 %
Patient temperature: 98.8
Patient temperature: 98.5
Potassium: 3.7 mmol/L (ref 3.5–5.1)
Potassium: 3.4 mmol/L — ABNORMAL LOW (ref 3.5–5.1)
Sodium: 133 mmol/L — ABNORMAL LOW (ref 135–145)
Sodium: 133 mmol/L — ABNORMAL LOW (ref 135–145)
TCO2: 28 mmol/L (ref 22–32)
TCO2: 27 mmol/L (ref 22–32)
pCO2 arterial: 31.6 mmHg — ABNORMAL LOW (ref 32–48)
pCO2 arterial: 31.8 mmHg — ABNORMAL LOW (ref 32–48)
pH, Arterial: 7.543 — ABNORMAL HIGH (ref 7.35–7.45)
pH, Arterial: 7.523 — ABNORMAL HIGH (ref 7.35–7.45)
pO2, Arterial: 53 mmHg — ABNORMAL LOW (ref 83–108)
pO2, Arterial: 67 mmHg — ABNORMAL LOW (ref 83–108)

## 2022-07-10 LAB — GLUCOSE, CAPILLARY
Glucose-Capillary: 145 mg/dL — ABNORMAL HIGH (ref 70–99)
Glucose-Capillary: 190 mg/dL — ABNORMAL HIGH (ref 70–99)
Glucose-Capillary: 191 mg/dL — ABNORMAL HIGH (ref 70–99)
Glucose-Capillary: 223 mg/dL — ABNORMAL HIGH (ref 70–99)
Glucose-Capillary: 224 mg/dL — ABNORMAL HIGH (ref 70–99)
Glucose-Capillary: 253 mg/dL — ABNORMAL HIGH (ref 70–99)
Glucose-Capillary: 275 mg/dL — ABNORMAL HIGH (ref 70–99)

## 2022-07-10 LAB — CBC WITH DIFFERENTIAL/PLATELET
Abs Immature Granulocytes: 0.14 10*3/uL — ABNORMAL HIGH (ref 0.00–0.07)
Basophils Absolute: 0.1 10*3/uL (ref 0.0–0.1)
Basophils Relative: 1 %
Eosinophils Absolute: 0.4 10*3/uL (ref 0.0–0.5)
Eosinophils Relative: 3 %
HCT: 28 % — ABNORMAL LOW (ref 39.0–52.0)
Hemoglobin: 9.4 g/dL — ABNORMAL LOW (ref 13.0–17.0)
Immature Granulocytes: 1 %
Lymphocytes Relative: 6 %
Lymphs Abs: 0.6 10*3/uL — ABNORMAL LOW (ref 0.7–4.0)
MCH: 27.6 pg (ref 26.0–34.0)
MCHC: 33.6 g/dL (ref 30.0–36.0)
MCV: 82.1 fL (ref 80.0–100.0)
Monocytes Absolute: 1.4 10*3/uL — ABNORMAL HIGH (ref 0.1–1.0)
Monocytes Relative: 13 %
Neutro Abs: 8.4 10*3/uL — ABNORMAL HIGH (ref 1.7–7.7)
Neutrophils Relative %: 76 %
Platelets: 271 10*3/uL (ref 150–400)
RBC: 3.41 MIL/uL — ABNORMAL LOW (ref 4.22–5.81)
RDW: 14.6 % (ref 11.5–15.5)
WBC: 11 10*3/uL — ABNORMAL HIGH (ref 4.0–10.5)
nRBC: 1.1 % — ABNORMAL HIGH (ref 0.0–0.2)

## 2022-07-10 LAB — LACTATE DEHYDROGENASE: LDH: 388 U/L — ABNORMAL HIGH (ref 98–192)

## 2022-07-10 LAB — COOXEMETRY PANEL
Carboxyhemoglobin: 2.8 % — ABNORMAL HIGH (ref 0.5–1.5)
Methemoglobin: <0.7 % (ref 0.0–1.5)
O2 Saturation: 62 %
Total hemoglobin: 9.8 g/dL — ABNORMAL LOW (ref 12.0–16.0)

## 2022-07-10 LAB — PHOSPHORUS: Phosphorus: 1.5 mg/dL — ABNORMAL LOW (ref 2.5–4.6)

## 2022-07-10 LAB — MAGNESIUM: Magnesium: 1.8 mg/dL (ref 1.7–2.4)

## 2022-07-10 LAB — PROTIME-INR
INR: 2.4 — ABNORMAL HIGH (ref 0.8–1.2)
Prothrombin Time: 25.7 seconds — ABNORMAL HIGH (ref 11.4–15.2)

## 2022-07-10 MED ORDER — MAGNESIUM SULFATE 2 GM/50ML IV SOLN
2.0000 g | Freq: Once | INTRAVENOUS | Status: AC
  Administered 2022-07-10: 2 g via INTRAVENOUS
  Filled 2022-07-10: qty 50

## 2022-07-10 MED ORDER — POTASSIUM CHLORIDE CRYS ER 20 MEQ PO TBCR
20.0000 meq | EXTENDED_RELEASE_TABLET | ORAL | Status: AC
  Administered 2022-07-10 (×3): 20 meq via ORAL
  Filled 2022-07-10 (×3): qty 1

## 2022-07-10 MED ORDER — SODIUM PHOSPHATES 45 MMOLE/15ML IV SOLN
30.0000 mmol | Freq: Once | INTRAVENOUS | Status: AC
  Administered 2022-07-10: 30 mmol via INTRAVENOUS
  Filled 2022-07-10: qty 10

## 2022-07-10 MED ORDER — WARFARIN SODIUM 2.5 MG PO TABS
2.5000 mg | ORAL_TABLET | Freq: Once | ORAL | Status: AC
  Administered 2022-07-10: 2.5 mg via ORAL
  Filled 2022-07-10: qty 1

## 2022-07-10 MED ORDER — POTASSIUM CHLORIDE CRYS ER 20 MEQ PO TBCR: 20.0000 meq | EXTENDED_RELEASE_TABLET | ORAL | Status: DC

## 2022-07-10 MED FILL — Sodium Chloride IV Soln 0.9%: INTRAVENOUS | Qty: 2000 | Status: AC

## 2022-07-10 MED FILL — Electrolyte-R (PH 7.4) Solution: INTRAVENOUS | Qty: 5000 | Status: AC

## 2022-07-10 MED FILL — Sodium Bicarbonate IV Soln 8.4%: INTRAVENOUS | Qty: 50 | Status: AC

## 2022-07-10 MED FILL — Mannitol IV Soln 20%: INTRAVENOUS | Qty: 500 | Status: AC

## 2022-07-10 MED FILL — Heparin Sodium (Porcine) Inj 1000 Unit/ML: INTRAMUSCULAR | Qty: 20 | Status: AC

## 2022-07-10 NOTE — Progress Notes (Signed)
NAME:  Darryl Frye, MRN:  [id number], DOB:  1960/09/30, LOS: 41 ADMISSION DATE:  06/27/2022, CONSULTATION DATE:  11/14 REFERRING MD:  Aundra Dubin, CHIEF COMPLAINT:  critical care support s/p LVAD   History of Present Illness:  61 year old male w/ sig h/o NICM (EF 20-25%), af/flutter. Presented 11/14 for LVAD after failing out-pt medical therapies. Initial flow 4.5 l/min speed 5100 power 3.6 P1 2.3  Pertinent  Medical History  Afib/flutter s/p ablation May 2023 NICM EF 20-25% has ICD Remote h/o heavy ETOH and cocaine Class IV NYHF sxs.   Significant Hospital Events: Including procedures, antibiotic start and stop dates in addition to other pertinent events   11/14 LVAD placed. Returned to ICU full vent support. On iNO 20ppm, Returned to ICU post-op intubated on Neo, epi 6, and amio gtt. Initial hemodynamics PA 34/20 CO 4.9 CI 2.4 co ox 70% l CVP 10-. On full vent support. PCCM asked to assist w/ care  11/15 iNO weaned off. Got lasix. Pump speed increased to 5200. Still on epi and milrione. Awake following commands 11/15 attempted SBT but saturations dropped 11/16 extubated  11/17 mobilizations, remains on pressors   Interim History / Subjective:   Epinephrine Was titrated off this morning Remain on milrinone and Lasix infusion LVAD in place  Objective   Blood pressure 101/72, pulse 95, temperature 98.9 F (37.2 C), temperature source Oral, resp. rate (!) 31, height 5\' 11"  (1.803 m), weight 93.1 kg, SpO2 90 %. CVP:  [9 mmHg-15 mmHg] 15 mmHg  FiO2 (%):  [40 %-60 %] 55 %   Intake/Output Summary (Last 24 hours) at 07/10/2022 1122 Last data filed at 07/10/2022 1100 Gross per 24 hour  Intake 2917.76 ml  Output 6690 ml  Net -3772.24 ml   Filed Weights   07/08/22 0500 07/09/22 0500 07/10/22 0500  Weight: 94 kg 93.7 kg 93.1 kg   Examination: General: Middle-age male sitting in the chair on high flow nasal cannula oxygen HENT NCAT, tracking appropriately Heart: Hum from LVAD,  midline sternal incision Pulm: Diminished breath sounds bile laterally no crackles Abd: Soft nontender nondistended Ext: Warm dry no edema Neuro: Alert following commands GU: Clear urine in Foley Skin: No rash  Ancillary tests personally reviewed:     Assessment & Plan:  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 s/p LVAD HM3 Epinephrine was titrated off this morning Continue milrinone per heart failure service Continue sildenafil and digoxin Remains on amiodarone infusion Coox 62% today Remain on Lasix infusion, down 1 pound weight since yesterday  Acute Hypoxemic Respiratory Failure  Patient remains on high flow nasal cannula oxygen Titrate with O2 sat goal 92% Continue PT/OT evaluation Continue incentive spirometry and flutter valve  H/o afib/flutter s/p prior ablation Was previously on DOAC, currently in NSR Cont tele On Amiodarone Heart rate control per cardiology Continue Coumadin  H/o DVT Continue Coumadin Appreciate pharmacy to see help.  Hyponatremia/hypokalemia/hypophosphatemia/hypocalcemia Continue aggressive electrolyte supplement and monitor  Acute kidney injury likely due to cardiorenal syndrome Serum creatinine continue to improve Monitor intake and output Avoid nephrotoxic agents  Best Practice (right click and "Reselect all SmartList Selections" daily)   Diet/type: 2 g sodium diet DVT prophylaxis: Coumadin GI prophylaxis: Protonix Lines: Central line Foley:  Yes, and it is still needed Code Status:  limited Last date of multidisciplinary goals of care discussion [Per primary team]   Total critical care time: 37 minutes  Performed by: [place] care time was exclusive of  separately billable procedures and treating other patients.   Critical care was necessary to treat or prevent imminent or life-threatening deterioration.   Critical care was time spent personally by me on the following activities:  development of treatment plan with patient and/or surrogate as well as nursing, discussions with consultants, evaluation of patient's response to treatment, examination of patient, obtaining history from patient or surrogate, ordering and performing treatments and interventions, ordering and review of laboratory studies, ordering and review of radiographic studies, pulse oximetry and re-evaluation of patient's condition.   Jacky Kindle, MD [facility] See Amion for pager If no response to pager, please call [phone] until 7pm After 7pm, Please call E-link [phone]

## 2022-07-10 NOTE — Progress Notes (Signed)
Nutrition Follow-up  DOCUMENTATION CODES:   Not applicable  INTERVENTION:   Ensure Enlive po TID, each supplement provides 350 kcal and 20 grams of protein.  Liberalize diet to 2g sodium with 1800 mL fluid restriction. Pt does not need renal diet as potassium and phosphorus levels are low, Creatinine wdl  NUTRITION DIAGNOSIS:   Increased nutrient needs related to catabolic illness, post-op healing as evidenced by estimated needs.  Being addressed via TF   GOAL:   Patient will meet greater than or equal to 90% of their needs  Progressing  MONITOR:   PO intake, Supplement acceptance, Labs, Weight trends, Skin  REASON FOR ASSESSMENT:   Consult LVAD Eval  ASSESSMENT:   61 yo male admitted with acute on chronic CHF, now being evaluated for possible LVAD. Last ECHO 2/23 with EF 20-25% with EF <10% in 11/20 with initiatial dx. PMH includes nonischemic CM, a.fib/flutter, DM, CAD, HTN, DVT. 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  11/14 LVAD HM3 11/16 Extubated  Pt remains  HFNC. Pt walked a bit today.  Lasix gtt, CVP 16-17  Appetite has improved but still not back to baseline. Pt reports he is drinking Ensure. Pt ate 10% at lunch today, a little bit of breakfast. No po intake recorded over the weekend.   Constipation resolved over the weekend, appetite has improved per RN. No recorded po intake since 11/13.   Noted pt changed to Renal Diet on 11/18. Creatinine wdl, phosphorus actually very low, potassium low as well. Pt only needs sodium and fluid restriction at this time.   Labs: phosphorus 1.5 (L), potassium 3.4 (L), sodium 133 (low) Meds: lasix gtt, ss novolog, semglee, MVI with Minerals, miralax, sodium phosphate  Diet Order:     RENAL DIET with 1800 mL FLUID RESTRICTION   EDUCATION NEEDS:   Education needs have been addressed  Skin:  Skin Assessment: Skin Integrity Issues: Skin Integrity Issues:: Incisions Incisions:  new LVAD placement  Last BM:  11/19  Height:   Ht Readings from Last 1 Encounters:  07/05/22 5\' 11"  (1.803 m)    Weight:   Wt Readings from Last 1 Encounters:  07/10/22 93.1 kg    BMI:  Body mass index is 28.63 kg/m.  Estimated Nutritional Needs:   Kcal:  2100-2300 kcals  Protein:  110-125 g  Fluid:  1.8 L   Kerman Passey MS, RDN, LDN, CNSC Registered Dietitian 3 Clinical Nutrition RD Pager and On-Call Pager Number Located in [place]

## 2022-07-10 NOTE — PMR Pre-admission (Signed)
PMR Admission Coordinator Pre-Admission Assessment  Patient: Darryl Frye is an 61 y.o., male MRN: [id number] DOB: July 05, 1961 Height: _0  (180.3 cm) Weight: 93.1 kg  Insurance Information HMO: yes    PPO:      PCP:      IPA:      80/20:      OTHER:  PRIMARY: [employer] medicare      Policy#: [id number] Group [id number]      Subscriber: Patient CM Name:       Phone#: [phone]     Fax#: [phone] Pre-Cert#: [id number]  approval received from [place] approved for 07/17/22-07/24/22     Benefits:  Phone #: [email or web address]      Eff. Date: 03/21/2022-08/20/2022     Deduct: does not have      Out of Pocket Max: $8,300 ($409.79 met)       CIR: $1,556/admission co-pay      SNF: $0 copay days 1-20, $200 copay days 21-100, max of 100 days/benefit period Outpatient: 80% coverage, 20% coinsurance     Home Health: 100% coverage, limited by medical nec.      DME: 80% coverage, 20% coinsurance      Providers: patient choice.  SECONDARY: Medicaid [place] Access      Policy#: [id number] n     Phone#: [phone]   The "Data Collection Information Summary" for patients in Inpatient Rehabilitation Facilities with attached "Privacy Act [place] Records" was provided and verbally reviewed with: Patient and Family  Emergency Contact Information Contact Information     Name Relation Home Work Mobile   [place] Friend [phone]  [phone]   Angie, [place] Daughter   [phone]       Current Medical History  Patient Admitting Diagnosis: Cardiogenic Shock History of Present Illness: Patient is a 61 year old male admitted to [facility] 06/27/22 with non-ischemic Cardiomyopathy and is s/p LVAD insertion 07/04/22. Cath in 12/20 with no significant coronary disease.  Medtronic ICD. Most recent echo in 2/23 showed EF 20-25% with normal RV.  Recently, patient has been symptomatically worse, NYHA class IV with profound orthopnea. RHC showed markedly elevated filling pressures, primarily  pulmonary venous hypertension, low cardiac output, and low PAPI. Echo this admission with EF <20%, mild RV dysfunction.  He was started on milrinone with improvement in hemodynamics. Patient extubated 07/06/22. Developed RVR on 07/07/22, NSR on 07/08/22.  Epistaxis: s/p cauterization/packing 11/22, packing now removed.  Anemia: Hgb 7.9 => 7.8 => 7.6=>1uRBCs=>8.3=>8.2. Hyponatremia: Na 133. Fluid restrict. Therapies recommending intensive rehab program.     Patient's medical record from [facility] has been reviewed by the rehabilitation admission coordinator and physician.  Past Medical History  Past Medical History:  Diagnosis Date   Acute kidney injury ([place])    Acute respiratory failure (H[facility])    Atrial flutter (HCC)    on Eliquis   CHF (congestive heart failure) ([place])    EF 20-25% 10/15/21, AICD in place.   Coronary artery disease    Diabetes mellitus without complication (HCC)    DVT (deep venous thrombosis) ([place]) 06/2019   Heart murmur    Hypertension    Presence of permanent cardiac pacemaker    Renal disorder     Has the patient had major surgery during 100 days prior to admission? Yes  Family History   family history includes Diabetes in his mother; Heart disease in his maternal grandmother and mother; Hyperlipidemia in his mother; Hypertension in his mother; Prostate cancer in his maternal grandfather.  Current Medications  Current Facility-Administered  Medications:    0.45 % sodium chloride infusion, , Intravenous, Continuous PRN, Enter, Pierre Bali, MD, Stopped at 07/07/22 1346   0.9 %  sodium chloride infusion (Manually program via Guardrails IV Fluids), , Intravenous, Once, Leonor Liv, CRNA   0.9 %  sodium chloride infusion, 250 mL, Intravenous, Continuous, Enter, Pierre Bali, MD   0.9 %  sodium chloride infusion, , Intravenous, Continuous, Enter, Pierre Bali, MD   acetaminophen (TYLENOL) tablet 650 mg, 650 mg, Oral, Q4H PRN, Larey Dresser, MD   ALPRAZolam Duanne Moron) tablet  0.25 mg, 0.25 mg, Oral, TID PRN, Enter, Pierre Bali, MD, 0.25 mg at 07/07/22 2037   amiodarone (NEXTERONE PREMIX) 360-4.14 MG/200ML-% (1.8 mg/mL) IV infusion, 30 mg/hr, Intravenous, Continuous, Bensimhon, Shaune Pascal, MD, Last Rate: 16.67 mL/hr at 07/10/22 1300, 30 mg/hr at 07/10/22 1300   aspirin chewable tablet 81 mg, 81 mg, Oral, Daily, Enter, Pierre Bali, MD, 81 mg at 07/10/22 0913   atorvastatin (LIPITOR) tablet 20 mg, 20 mg, Oral, Daily, Larey Dresser, MD, 20 mg at 07/10/22 0911   bisacodyl (DULCOLAX) EC tablet 10 mg, 10 mg, Oral, Daily, 10 mg at 07/10/22 0913 **OR** bisacodyl (DULCOLAX) suppository 10 mg, 10 mg, Rectal, Daily, Enter, Pierre Bali, MD   ceFEPIme (MAXIPIME) 2 g in sodium chloride 0.9 % 100 mL IVPB, 2 g, Intravenous, Q8H, Elsie Amis, RPH, Last Rate: 200 mL/hr at 07/10/22 1352, 2 g at 07/10/22 1352   Chlorhexidine Gluconate Cloth 2 % PADS 6 each, 6 each, Topical, Daily, Larey Dresser, MD, 6 each at 07/09/22 1400   dextrose 50 % solution 0-50 mL, 0-50 mL, Intravenous, PRN, Enter, Pierre Bali, MD   digoxin Fonnie Birkenhead) tablet 0.125 mg, 0.125 mg, Oral, Daily, Larey Dresser, MD, 0.125 mg at 07/10/22 0913   feeding supplement (ENSURE ENLIVE / ENSURE PLUS) liquid 237 mL, 237 mL, Oral, TID BM, Enter, Pierre Bali, MD, 237 mL at 07/10/22 1346   furosemide (LASIX) 200 mg in dextrose 5 % 100 mL (2 mg/mL) infusion, 10 mg/hr, Intravenous, Continuous, Bensimhon, Shaune Pascal, MD, Last Rate: 5 mL/hr at 07/10/22 1300, 10 mg/hr at 07/10/22 1300   hydrALAZINE (APRESOLINE) injection 20 mg, 20 mg, Intravenous, Q4H PRN, Larey Dresser, MD, 20 mg at 07/06/22 2212   CBG monitoring, , , Q4H **AND** insulin aspart (novoLOG) injection 0-24 Units, 0-24 Units, Subcutaneous, Q4H, Enter, Pierre Bali, MD, 12 Units at 07/10/22 1200   insulin glargine-yfgn (SEMGLEE) injection 35 Units, 35 Units, Subcutaneous, Daily, Icard, Bradley L, DO, 35 Units at 07/10/22 1054   ipratropium-albuterol (DUONEB) 0.5-2.5 (3) MG/3ML nebulizer  solution 3 mL, 3 mL, Nebulization, Q6H, Larey Dresser, MD, 3 mL at 07/10/22 0835   lactated ringers infusion 500 mL, 500 mL, Intravenous, Once PRN, Enter, Pierre Bali, MD   lactated ringers infusion, , Intravenous, Continuous, Enter, Pierre Bali, MD, Stopped at 07/04/22 2207   lactated ringers infusion, , Intravenous, Continuous, Enter, Pierre Bali, MD, Last Rate: 20 mL/hr at 07/10/22 1300, Infusion Verify at 07/10/22 1300   levothyroxine (SYNTHROID) tablet 25 mcg, 25 mcg, Oral, Q0600, Larey Dresser, MD, 25 mcg at 07/10/22 0618   lidocaine (LIDODERM) 5 % 2 patch, 2 patch, Transdermal, Q24H, Agarwala, Ravi, MD, 2 patch at 07/09/22 1823   magnesium oxide (MAG-OX) tablet 400 mg, 400 mg, Oral, BID, Larey Dresser, MD, 400 mg at 07/10/22 0912   melatonin tablet 3 mg, 3 mg, Oral, QHS, Coralie Common, MD, 3 mg at 07/09/22 2108   milrinone [facility])  20 MG/100 ML (0.2 mg/mL) infusion, 0.375 mcg/kg/min, Intravenous, Continuous, Bensimhon, Shaune Pascal, MD, Last Rate: 10.11 mL/hr at 07/10/22 1300, 0.375 mcg/kg/min at 07/10/22 1300   morphine (PF) 2 MG/ML injection 1-4 mg, 1-4 mg, Intravenous, Q1H PRN, Enter, Pierre Bali, MD, 2 mg at 07/06/22 0636   multivitamin with minerals tablet 1 tablet, 1 tablet, Oral, Daily, Larey Dresser, MD, 1 tablet at 07/10/22 0913   norepinephrine (LEVOPHED) 46m in 2536m(0.016 mg/mL) premix infusion, 0-12 mcg/min, Intravenous, Titrated, Enter, DaPierre BaliMD, Stopped at 07/06/22 0221   ondansetron (ZOFRAN) injection 4 mg, 4 mg, Intravenous, Q6H PRN, McLarey DresserMD   ondansetron (ZColumb[facility] Point Gastroenterologyinjection 4 mg, 4 mg, Intravenous, Q6H PRN, Enter, DaPierre BaliMD   Oral care mouth rinse, 15 mL, Mouth Rinse, PRN, Enter, DaPierre BaliMD   oxyCODONE (Oxy IR/ROXICODONE) immediate release tablet 5-10 mg, 5-10 mg, Oral, Q3H PRN, Enter, DaPierre BaliMD, 5 mg at 07/10/22 0048   pantoprazole (PROTONIX) EC tablet 40 mg, 40 mg, Oral, Daily, Enter, DaPierre BaliMD, 40 mg at 07/10/22 0912   polyethylene glycol  (MIRALAX / GLYCOLAX) packet 17 g, 17 g, Oral, BID, Clegg, Amy D, NP, 17 g at 07/10/22 0914   potassium chloride SA (KLOR-CON M) CR tablet 20 mEq, 20 mEq, Oral, Q4H, Enter, DaPierre BaliMD, 20 mEq at 07/10/22 1300   senna-docusate (Senokot-S) tablet 1 tablet, 1 tablet, Oral, BID, Clegg, Amy D, NP, 1 tablet at 07/10/22 0913   sertraline (ZOLOFT) tablet 50 mg, 50 mg, Oral, Daily, McLarey DresserMD, 50 mg at 07/10/22 095625 sildenafil (REVATIO) tablet 20 mg, 20 mg, Oral, TID, Clegg, Amy D, NP, 20 mg at 07/10/22 0912   sodium chloride flush (NS) 0.9 % injection 10-40 mL, 10-40 mL, Intracatheter, Q12H, McLarey DresserMD, 10 mL at 07/10/22 1057   sodium chloride flush (NS) 0.9 % injection 10-40 mL, 10-40 mL, Intracatheter, PRN, McLarey DresserMD   sodium chloride flush (NS) 0.9 % injection 10-40 mL, 10-40 mL, Intracatheter, Q12H, Enter, DaPierre BaliMD, 10 mL at 07/10/22 1057   sodium chloride flush (NS) 0.9 % injection 10-40 mL, 10-40 mL, Intracatheter, PRN, Enter, DaPierre BaliMD   sodium chloride flush (NS) 0.9 % injection 3 mL, 3 mL, Intravenous, Q12H, McLarey DresserMD, 3 mL at 07/10/22 1057   sodium chloride flush (NS) 0.9 % injection 3 mL, 3 mL, Intravenous, Q12H, Enter, DaPierre BaliMD, 3 mL at 07/10/22 1057   sodium chloride flush (NS) 0.9 % injection 3 mL, 3 mL, Intravenous, PRN, Enter, DaPierre BaliMD   sodium phosphate 30 mmol in dextrose 5 % 250 mL infusion, 30 mmol, Intravenous, Once, McLarey DresserMD, Last Rate: 43 mL/hr at 07/10/22 1300, Infusion Verify at 07/10/22 1300   spironolactone (ALDACTONE) tablet 25 mg, 25 mg, Oral, Daily, Bensimhon, DaShaune PascalMD, 25 mg at 07/10/22 0912   traMADol (ULTRAM) tablet 50-100 mg, 50-100 mg, Oral, Q4H PRN, Enter, DaPierre BaliMD, 100 mg at 07/06/22 1649   vancomycin (VANCOCIN) IVPB 1000 mg/200 mL premix, 1,000 mg, Intravenous, Q12H, JaElsie AmisRPH, Stopped at 07/10/22 1151   Warfarin - Physician Dosing Inpatient, , Does not apply, q1600, Enter,  DaPierre BaliMD, Given at 07/09/22 1557  Patients Current Diet:  Diet Order             Diet 2 gram sodium Room service appropriate? Yes; Fluid consistency: Thin; Fluid restriction: 1800 mL Fluid  Diet effective  now                   Precautions / Restrictions Precautions Precautions: Fall, Sternal Precaution Booklet Issued: No Precaution Comments: LVAD, Chest tube, HHFNC Restrictions Weight Bearing Restrictions: No RUE Weight Bearing: Non weight bearing LUE Weight Bearing: Non weight bearing Other Position/Activity Restrictions: UE sternal precautions   Has the patient had 2 or more falls or a fall with injury in the past year? No  Prior Activity Level    Prior Functional Level Self Care: Did the patient need help bathing, dressing, using the toilet or eating? Independent  Indoor Mobility: Did the patient need assistance with walking from room to room (with or without device)? Independent  Stairs: Did the patient need assistance with internal or external stairs (with or without device)? Independent  Functional Cognition: Did the patient need help planning regular tasks such as shopping or remembering to take medications? Independent  Patient Information Are you of Hispanic, Latino/a,or Spanish origin?: A. No, not of Hispanic, Latino/a, or Spanish origin What is your race?: B. Black or African American Do you need or want an interpreter to communicate with a doctor or health care staff?: 0. No  Patient's Response To:  Health Literacy and Transportation Is the patient able to respond to health literacy and transportation needs?: Yes Health Literacy - How often do you need to have someone help you when you read instructions, pamphlets, or other written material from your doctor or pharmacy?: Never In the past 12 months, has lack of transportation kept you from medical appointments or from getting medications?: No In the past 12 months, has lack of transportation kept you  from meetings, work, or from getting things needed for daily living?: No  Home Assistive Devices / Equipment Home Equipment: Cane - quad  Prior Device Use: Indicate devices/aids used by the patient prior to current illness, exacerbation or injury?  Occasional use of cane  Current Functional Level Cognition  Overall Cognitive Status: Within Functional Limits for tasks assessed Orientation Level: Oriented X4 General Comments: very motivated    Extremity Assessment (includes Sensation/Coordination)  Upper Extremity Assessment: Generalized weakness (difficult to fully assess due to sternal precautions)  Lower Extremity Assessment: Defer to PT evaluation    ADLs  Overall ADL's : Needs assistance/impaired Eating/Feeding: Set up, Bed level Grooming: Wash/dry hands, Wash/dry face, Set up, Bed level Upper Body Bathing: Minimal assistance, Bed level Lower Body Bathing: Maximal assistance, Bed level Upper Body Dressing : Moderate assistance Upper Body Dressing Details (indicate cue type and reason): for sternal precautions Lower Body Dressing: Maximal assistance, Bed level Toilet Transfer: Minimal assistance, +2 for physical assistance, +2 for safety/equipment, Ambulation Toilet Transfer Details (indicate cue type and reason): eva walker. simulated. Toileting- Clothing Manipulation and Hygiene: Maximal assistance, Sit to/from stand Functional mobility during ADLs: Minimal assistance, +2 for physical assistance, +2 for safety/equipment General ADL Comments: cues throughout to maintain sternal precautions. +2 used for line management, LVAD management and chair follow.    Mobility  Overal bed mobility: Needs Assistance Bed Mobility: Sit to Supine Supine to sit: +2 for physical assistance, Mod assist Sit to supine: +2 for physical assistance, Mod assist General bed mobility comments: Received OOB in chair. Assist for BLE's back into bed. Increased time    Transfers  Overall transfer level:  Needs assistance Equipment used: None Transfers: Sit to/from Stand Sit to Stand: Min assist, +2 physical assistance, Mod assist General transfer comment: Min-modA + 2 to rise from recliner, pt  holding onto sternal pillow, rocking to gain momentum.    Ambulation / Gait / Stairs / Wheelchair Mobility  Ambulation/Gait Ambulation/Gait assistance: Min assist, +2 physical assistance (+3 safety/equipment, chair follow) Gait Distance (Feet): 50 Feet (25", 25") Assistive device: Ethelene Hal Gait Pattern/deviations: Step-through pattern, Decreased stride length General Gait Details: Verbal cueing for upright posture, glute/quad activation, and scapular retraction. Pt with increased bilateral knee instability with distance. Needs cues for activity pacing and when to rest Gait velocity: decreased Gait velocity interpretation: <1.8 ft/sec, indicate of risk for recurrent falls    Posture / Balance Balance Overall balance assessment: Needs assistance Sitting-balance support: Feet supported Sitting balance-Leahy Scale: Fair Postural control: Posterior lean Standing balance support: Bilateral upper extremity supported Standing balance-Leahy Scale: Poor Standing balance comment: reliant on external support    Special needs/care consideration Oxygen on room air, Skin intact, and Special service needs TVAR   Previous Home Environment (from acute therapy documentation) Living Arrangements: Alone Available Help at Discharge: Family, Friend(s), Available 24 hours/day Type of Home: Apartment Home Layout: One level Home Access: Elevator Bathroom Shower/Tub: Chiropodist: Standard Bathroom Accessibility: Yes How Accessible: Accessible via walker [place]: No  Discharge Living Setting Plans for Discharge Living Setting: Patient's home, Alone, Apartment Type of Home at Discharge: Apartment Discharge Home Layout: One level Discharge Home Access: Elevator, Level  entry Discharge Bathroom Shower/Tub: Tub/shower unit Discharge Bathroom Toilet: Standard Discharge Bathroom Accessibility: Yes How Accessible: Accessible via walker Does the patient have any problems obtaining your medications?: No  Social/Family/Support Systems Anticipated Caregiver: Wife Anticipated Caregiver's Contact Information: [phone] Ability/Limitations of Caregiver: not available 24/7, but friend, Fayette Pho can help as well. Caregiver Availability: 24/7 Discharge Plan Discussed with Primary Caregiver: Yes Is Caregiver In Agreement with Plan?: Yes Does Caregiver/Family have Issues with Lodging/Transportation while Pt is in Rehab?: No  Goals Patient/Family Goal for Rehab: Mod I PT/OT Expected length of stay: 7-10 days Pt/Family Agrees to Admission and willing to participate: Yes Program Orientation Provided & Reviewed with Pt/Caregiver Including Roles  & Responsibilities: Yes  Barriers to Discharge: Insurance for SNF coverage  Decrease burden of Care through IP rehab admission: Othern/a  Possible need for SNF placement upon discharge: not anticpated  Patient Condition: I have reviewed medical records from [facility], spoken with  TOC , and patient, spouse, and daughter. I met with patient at the bedside and discussed via phone for inpatient rehabilitation assessment.  Patient will benefit from ongoing PT and OT, can actively participate in 3 hours of therapy a day 5 days of the week, and can make measurable gains during the admission.  Patient will also benefit from the coordinated team approach during an Inpatient Acute Rehabilitation admission.  The patient will receive intensive therapy as well as Rehabilitation physician, nursing, social worker, and care management interventions.  Due to safety, skin/wound care, disease management, medication administration, pain management, and patient education the patient requires 24 hour a day rehabilitation nursing.  The patient  is currently Min A with mobility and basic ADLs.  Discharge setting and therapy post discharge at home with home health is anticipated.  Patient has agreed to participate in the Acute Inpatient Rehabilitation Program and will admit 07/21/22.  Preadmission Screen Completed By:  Nelly Laurence, 07/10/2022 2:03 PM ______________________________________________________________________   Discussed status with Dr. Naaman Plummer on 07/21/22 at 9:30 am and received approval for admission today.  Admission Coordinator:  Nelly Laurence, time 9:57 am/Date 07/21/22   Assessment/Plan: Diagnosis: Cardiac debility  Does the need for close, 24 hr/day Medical supervision in concert with the patient's rehab needs make it unreasonable for this patient to be served in a less intensive setting? Yes Co-Morbidities requiring supervision/potential complications: overweight, cardiogenic shock, atrial fibrillation with RVR, acute pulmonary edema, aspiration pneumonia of both lower lobes Due to bladder management, bowel management, safety, skin/wound care, disease management, medication administration, pain management, and patient education, does the patient require 24 hr/day rehab nursing? Yes Does the patient require coordinated care of a physician, rehab nurse, PT, OT, and SLP to address physical and functional deficits in the context of the above medical diagnosis(es)? Yes Addressing deficits in the following areas: balance, endurance, locomotion, strength, transferring, bowel/bladder control, bathing, dressing, feeding, grooming, toileting, and psychosocial support Can the patient actively participate in an intensive therapy program of at least 3 hrs of therapy 5 days a week? Yes The potential for patient to make measurable gains while on inpatient rehab is excellent Anticipated functional outcomes upon discharge from inpatient rehab: modified independent PT, modified independent OT, independent SLP Estimated rehab length  of stay to reach the above functional goals is: 10-14 days Anticipated discharge destination: Home 10. Overall Rehab/Functional Prognosis: excellent   MD Signature: Leeroy Cha, MD

## 2022-07-10 NOTE — Progress Notes (Incomplete)
Inpatient Diabetes Program Recommendations  AACE/ADA: New Consensus Statement on Inpatient Glycemic Control (2015)  Target Ranges:  Prepandial:   less than 140 mg/dL      Peak postprandial:   less than 180 mg/dL (1-2 hours)      Critically ill patients:  140 - 180 mg/dL   Lab Results  Component Value Date   GLUCAP 275 (H) 07/10/2022   HGBA1C 9.3 (H) 06/27/2022    Review of Glycemic Control  Latest Reference Range & Units 07/10/22 04:07 07/10/22 07:48 07/10/22 11:48 07/10/22 11:57  Glucose-Capillary 70 - 99 mg/dL 145 (H) 253 (H) 190 (H) 275 (H)   Diabetes history: DM 2 Outpatient Diabetes medications:  Lantus 40 units daily, Jardiance 25 mg daily, Metformin 1000 mg bid Current orders for Inpatient glycemic control:  TCTS Novolog q 4 hours Semglee 35 units daily Inpatient Diabetes Program Recommendations:

## 2022-07-10 NOTE — Progress Notes (Addendum)
LVAD Coordinator Rounding Note:  HM 3 LVAD implanted on 07/04/22 by Dr Tenny Craw under destination criteria due to uncontrolled diabetes. Left atrial appendage clipped at time of surgery.  Pt sitting in chair this morning on my arrival. States he feels good other than some soreness in his toes. Plan to ambulate this morning with physical therapy this morning.   Went into a-fib over the weekend. Rate 130s-150s. Has received 4 Amiodarone boluses. Remains in NSR on amio gtt.  Speed increased to 5300 per Dr Tenny Craw Saturday morning. Tolerating well.   Will plan to do further VAD education today afternoon at 1500 with [name] and [place].   Vital signs: Temp: 98.9 HR: 98 NSR Doppler Pressure:88 Arterial BP: 101/67 (77) O2 Sat: 90% on 20L FiO2 60% HHFNC Wt: 214.3>208.8>209.2>207.2>206.6>205.3 lb  LVAD interrogation reveals:  Speed: 5300 Flow: 4.3 Power: 3.9 w PI: 4.3   Alarms: none Events: rare Hematocrit: 33  Fixed speed: 5300 Low speed limit: 5000   Drive Line: Plan for dressing change this afternoon with caregivers [name] and [employer]. Current dressing clean dry and intact. Anchor applied correctly.  Labs:  LDH trend: 326>400>462>422>387>388  INR trend: 1.1>1.5>1.4>1.5>3.0>2.4  WBC trend: 12.6>18>20.9>17.4>11.5>11.0  Anticoagulation Plan: -INR Goal: 2.0 - 2.5 -ASA Dose: 81mg    Blood Products:  IntraOp 11/14:  - 4 FFP  - DDAVP - 617 cell saver  PostOp 11/14: - 2 FFP  Device: -Medtronic -Therapies: OFF  Arrythmias: AFIB RVR 11/17  Respiratory: Extubated 07/06/22. On HHFNC at 20L and 60% FiO2. Using IS hourly.  Renal:  -BUN:25>18>20>26>27>25 -CRT:1.28>1.23>1.44>1.46>1.47>1.27  Drips: Milrinone 0.375 mcg/kg/hr Amiodarone 60 mg/hr Epinephrine stopped 11/20 Lasix 10 mg/hr Heparin stopped 11/19  Pt Education: Will plan to continue dressing change/VAD education with caregivers this afternoon.   Plan/Recommendations:  1. Page VAD coordinator for equipment or  drive line issues. 2. Daily drive line dressing changes per VAD coordinator, nurse champion, or trained caregiver.  Bobbye Morton RN [place] Coordinator  Office: [phone]  24/7 Pager: [phone]

## 2022-07-10 NOTE — Progress Notes (Signed)
Patient ID: Darryl Frye, male   DOB: [birth year], 61 y.o.   MRN: [id number] Advanced Heart Failure VAD Team Note  PCP-Cardiologist: Carlyle Dolly, MD   Subjective:    - 11/13 S/P HMIII L atrial appendage closure.  - 11/16 Extubated . GIven 40 mg IV and started on lasix drip 10 mg per/hr.  - 11/17 Developed A fib RVR. Given bolus 150 x 2. Unchanged.  - 11/18 Bedside echo 11/18 LV small. VAD cannula well aligned. AoV opening every beat. Septum pulling to left RV moderately HK. Small to moderate anterior effusion  Milrinone 0.375, epinephrine 1.  Co-ox 62%, CVP 16-17.  Good diuresis again yesterday, net negative 3049 with weight down 1 lb.  Drinking a lot fluid. Creatinine 1.0.  MAP 80s.   Still on high flow oxygen.  CXR unchanged with bilateral lower lobe infiltrates.   Remains in NSR on amio gtt. INR 2.4 on warfarin  Remains on vancomycin/cefepime.  Afebrile.    LVAD INTERROGATION:  HeartMate III LVAD:   Flow 4.2 liters/min, speed 5300, power 3.9, PI 3.8.  5 PI events.   Objective:    Vital Signs:   Temp:  [98.2 F (36.8 C)-98.7 F (37.1 C)] 98.7 F (37.1 C) (11/20 0400) Pulse Rate:  [90-108] 98 (11/20 0600) Resp:  [19-32] 24 (11/20 0600) BP: (101)/(72) 101/72 (11/19 0846) SpO2:  [82 %-98 %] 93 % (11/20 0600) Arterial Line BP: (91-119)/(63-104) 104/73 (11/20 0600) FiO2 (%):  [40 %-60 %] 60 % (11/20 0200) Weight:  [93.1 kg] 93.1 kg (11/20 0500) Last BM Date : 07/01/22 Mean arterial Pressure 80-90s  Intake/Output:   Intake/Output Summary (Last 24 hours) at 07/10/2022 0738 Last data filed at 07/10/2022 0700 Gross per 24 hour  Intake 3710.93 ml  Output 6760 ml  Net -3049.07 ml     Physical Exam   General: Well appearing this am. NAD.  HEENT: Normal. Neck: Supple, JVP 14 cm. Carotids OK.  Cardiac:  Mechanical heart sounds with LVAD hum present.  Lungs:  CTAB, normal effort.  Abdomen:  NT, ND, no HSM. No bruits or masses. +BS  LVAD exit site: Well-healed and  incorporated. Dressing dry and intact. No erythema or drainage. Stabilization device present and accurately applied. Driveline dressing changed daily per sterile technique. Extremities:  Warm and dry. No cyanosis, clubbing, rash, or edema.  Neuro:  Alert & oriented x 3. Cranial nerves grossly intact. Moves all 4 extremities w/o difficulty. Affect pleasant     Telemetry   NSR 90s Personally reviewed  Labs   Basic Metabolic Panel: Recent Labs  Lab 07/06/22 0414 07/06/22 1153 07/07/22 0354 07/07/22 0510 07/07/22 1557 07/08/22 0408 07/08/22 0421 07/08/22 1833 07/09/22 0259 07/09/22 7322 07/09/22 0254 07/09/22 2012 07/10/22 0200 07/10/22 0355 07/10/22 0638  NA 138   < > 137   < > 135 136   < > 136 136 135 134* 134* 133* 135 133*  K 4.3   < > 3.8   < > 3.7 3.0*   < > 3.3* 3.0* 3.0* 2.9* 3.5 3.7 3.7 3.4*  CL 109   < > 102  --  101 97*  --  96* 96* 95*  --  99  --  97*  --   CO2 21*   < > 21*  --  22 23  --  23 26 26   --  26  --  27  --   GLUCOSE 150*   < > 206*  --  180* 151*  --  206* 126* 118*  --  86  --  146*  --   BUN 24*   < > 28*  --  30* 25*  --  29* 25* 22*  --  23*  --  21*  --   CREATININE 1.51*   < > 1.37*  --  1.40* 1.27*  --  1.35* 1.18 1.08  --  1.05  --  1.00  --   CALCIUM 8.3*   < > 8.4*  --  8.3* 8.4*  --  8.5* 8.2* 8.2*  --  8.2*  --  8.5*  --   MG 2.7*   < > 2.3  --  2.2 2.0  --   --  1.9  --   --   --   --  1.8  --   PHOS 3.4  --  3.5  --   --  3.3  --   --  2.1*  --   --   --   --  1.5*  --    < > = values in this interval not displayed.    Liver Function Tests: Recent Labs  Lab 07/05/22 0401 07/06/22 0414 07/07/22 0354  AST 90* 102* 57*  ALT 25 27 21   ALKPHOS 70 74 89  BILITOT 0.8 0.6 0.7  PROT 6.5 6.4* 6.6  ALBUMIN 3.6 3.1* 2.9*   No results for input(s): "LIPASE", "AMYLASE" in the last 168 hours. No results for input(s): "AMMONIA" in the last 168 hours.  CBC: Recent Labs  Lab 07/06/22 0414 07/06/22 1518 07/07/22 0354 07/07/22 0510  07/08/22 0408 07/08/22 0421 07/09/22 0259 07/09/22 7673 07/10/22 0200 07/10/22 0355 07/10/22 0638  WBC 18.0*  --  20.9*  --  17.4*  --  11.5*  --   --  11.0*  --   NEUTROABS 15.1*  --  18.6*  --  15.2*  --  9.3*  --   --  8.4*  --   HGB 10.3*   < > 11.4*   < > 10.3*   < > 9.4* 9.5* 10.2* 9.4* 9.5*  HCT 31.3*   < > 33.1*   < > 29.6*   < > 26.2* 28.0* 30.0* 28.0* 28.0*  MCV 85.8  --  83.6  --  82.0  --  80.9  --   --  82.1  --   PLT 199  --  222  --  220  --  211  --   --  271  --    < > = values in this interval not displayed.    INR: Recent Labs  Lab 07/06/22 0414 07/07/22 0354 07/08/22 0900 07/09/22 0259 07/10/22 0355  INR 1.5* 1.4* 1.5* 3.0* 2.4*  :    Imaging   DG Abd 1 View  Result Date: 07/10/2022 CLINICAL DATA:  Abdominal distension. EXAM: ABDOMEN - 1 VIEW COMPARISON:  07/04/2022 FINDINGS: LVAD device is identified with in the projection of the left lower chest. Mediastinal drain is again noted. No pathologic dilatation of the large or small bowel loops. There is a mildly dilated loop of colon within the projection of the lower abdomen which appears unchanged from the previous exam. Gas noted throughout the colon up to the level of the rectum. IMPRESSION: 1. Nonobstructive bowel gas pattern. 2. Stable mildly dilated loop of colon within the lower abdomen. Electronically Signed   By: Kerby Moors M.D.   On: 07/10/2022 06:00   DG CHEST PORT 1 VIEW  Result Date: 07/10/2022  CLINICAL DATA:  LVAD device in place.  Shortness of breath. EXAM: PORTABLE CHEST 1 VIEW COMPARISON:  07/08/2022 FINDINGS: There is a right IJ catheter with tip in the projection of the mid SVC. Interval removal of pulmonary arterial catheter. Right arm PICC line tip is at the superior cavoatrial junction. Left chest wall ICD noted with leads in the right atrial appendage and right ventricle. Left atrial clip. LVAD device is again noted. Stable cardiomediastinal contours. Unchanged appearance of bilateral  interstitial and airspace opacities. IMPRESSION: 1. Interval removal of pulmonary arterial catheter. Support apparatus otherwise unchanged. 2. No change in aeration to the lungs compared with previous exam. Electronically Signed   By: Kerby Moors M.D.   On: 07/10/2022 05:57     Medications:     Scheduled Medications:  sodium chloride   Intravenous Once   aspirin  81 mg Oral Daily   atorvastatin  20 mg Oral Daily   bisacodyl  10 mg Oral Daily   Or   bisacodyl  10 mg Rectal Daily   Chlorhexidine Gluconate Cloth  6 each Topical Daily   digoxin  0.125 mg Oral Daily   feeding supplement  237 mL Oral TID BM   insulin aspart  0-24 Units Subcutaneous Q4H   insulin glargine-yfgn  35 Units Subcutaneous Daily   ipratropium-albuterol  3 mL Nebulization Q6H   levothyroxine  25 mcg Oral Q0600   lidocaine  2 patch Transdermal Q24H   magnesium oxide  400 mg Oral BID   melatonin  3 mg Oral QHS   multivitamin with minerals  1 tablet Oral Daily   pantoprazole  40 mg Oral Daily   polyethylene glycol  17 g Oral BID   potassium chloride  20 mEq Oral Q4H   senna-docusate  1 tablet Oral BID   sertraline  50 mg Oral Daily   sildenafil  20 mg Oral TID   sodium chloride flush  10-40 mL Intracatheter Q12H   sodium chloride flush  10-40 mL Intracatheter Q12H   sodium chloride flush  3 mL Intravenous Q12H   sodium chloride flush  3 mL Intravenous Q12H   spironolactone  25 mg Oral Daily   Warfarin - Physician Dosing Inpatient   Does not apply q1600    Infusions:  sodium chloride Stopped (07/07/22 1346)   sodium chloride     sodium chloride     amiodarone 30 mg/hr (07/10/22 0700)   ceFEPime (MAXIPIME) IV Stopped (07/10/22 1572)   furosemide (LASIX) 200 mg in dextrose 5 % 100 mL (2 mg/mL) infusion 10 mg/hr (07/10/22 0700)   lactated ringers     lactated ringers Stopped (07/04/22 2207)   lactated ringers 20 mL/hr at 07/10/22 0700   magnesium sulfate bolus IVPB     milrinone 0.375 mcg/kg/min  (07/10/22 0700)   norepinephrine (LEVOPHED) Adult infusion Stopped (07/06/22 0221)   vancomycin Stopped (07/09/22 2214)    PRN Medications: sodium chloride, acetaminophen, ALPRAZolam, dextrose, hydrALAZINE, lactated ringers, morphine injection, ondansetron (ZOFRAN) IV, ondansetron (ZOFRAN) IV, mouth rinse, oxyCODONE, sodium chloride flush, sodium chloride flush, sodium chloride flush, traMADol   Patient Profile   Admitted with A/C HFrEF --> cardiogenic shock. S/P HMIII LVAD  Assessment/Plan:    1. Acute on chronic systolic CHF/cardiogenic shock: Nonischemic cardiomyopathy, diagnosed 2020.  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  Cath in 12/20 with no significant coronary disease.  Medtronic ICD. Most recent echo in 2/23 showed EF 20-25% with normal RV.  Recently, patient has been symptomatically worse, NYHA class IV with profound orthopnea. RHC showed markedly elevated filling pressures, primarily pulmonary venous hypertension, low cardiac output, and low PAPI.  Patient was admitted for inotrope and diuresis. Echo this admission with EF <20%, mild RV dysfunction.  He was started on milrinone with improvement in hemodynamics.  We proceeded with HM3 LVAD with LAA clip on 11/14.  This morning, he is on epinephrine 1, milrinone 0.375, sildenafil 20 tid.  Co-ox 62% CVP 16-17.  MAP stable 80s. Good diuresis yesterday on Lasix 10 mg/hr.  - Continue milrinone 0.375 mcg/kg/min. This will likely be a slow wean.  - Stop epinephrine this morning.  - Still need fluid off, continue Lasix 10 mg/hr and replace K/Mg.  - Continue digoxin 0.125 daily.  - Continue sildenafil 20 tid.   2. A fib RVR : H/O S/p flutter ablation.  Back in NSR.  - Continue IV amiodarone 30 mg/hr.  - Continue warfarin.  3. VAD management: s/p HM-3 VAD on  11/14.  Bedside echo 11/18 LV small, VAD cannula well aligned, AoV opening every beat, septum pulling to left RV moderately HK, small to moderate anterior effusion. LDH 387 => 388.  - Now on warfarin with INR 2.4.  - ASA 81. - VAD interrogated personally. Parameters stable. 4. H/o DVT: On anticoagulation.  5. Acute hypoxemic respiratory failure, post-op. 11/16 extubated.  Bilateral lower lobe infiltrates on CXR. CVP still elevated.  - Continue broad spectrum abx.  - Continue diuresis - Incentive spirometry.  6. AKI: Creatinine improved 1.4 -> 1.2 -> 1.0, follow closely with diuresis.  7. ID: Low grade fever and leukocytosis post-op. Now afebrile with WBCs trending down.  - on vanco/cefepime 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  9. Hypokalemia - Continue spironolactone.  - Replace K.  10. Deconditioning - PT/OT needed today.  - ambulate   I reviewed the LVAD parameters from today, and compared the results to the patient's prior recorded data.  No programming changes were made.  The LVAD is functioning within specified parameters.  The patient performs LVAD self-test daily.  LVAD interrogation was negative for any significant power changes, alarms or PI events/speed drops.  LVAD equipment check completed and is in good working order.  Back-up equipment present.   LVAD education done on emergency procedures and precautions and reviewed exit site care.  CRITICAL CARE Performed by: Loralie Champagne  Total critical care time: 40 minutes  Critical care time was exclusive of separately billable procedures and treating other patients.  Critical care was necessary to treat or prevent imminent or life-threatening deterioration.  Critical care was time spent personally by me (independent of midlevel providers or residents) on the following activities: development of treatment plan with patient and/or surrogate as well as nursing, discussions with consultants, evaluation of patient's response to  treatment, examination of patient, obtaining history from patient or surrogate, ordering and performing treatments and interventions, ordering and review of laboratory studies, ordering and review of radiographic studies, pulse oximetry and re-evaluation of patient's condition.    Length of Stay: 65  Loralie Champagne, MD 07/10/2022, 7:38 AM  VAD Team --- VAD ISSUES ONLY--- Pager [phone] (7am - 7am) Advanced Heart Failure Team  Pager [phone] (M-F; 7a - 5p)  Please contact [facility] for night-coverage after hours (5p -7a ) and weekends on [email or web address]

## 2022-07-10 NOTE — Progress Notes (Signed)
Occupational Therapy Treatment Patient Details Name: Darryl Frye MRN: [id number] DOB: 1960/10/15 Today's Date: 07/10/2022   History of present illness Pt is a 61 y.o. male presenting 06/27/22 with CHF for RHC; admitted for inotrope and diuresis.  Other PMH includes NICM s/p ICD, aflutter, CAD, DM, DVT, HTN, heart murmur, renal disorder.  S/p LVAD 11/14.   OT comments  Darryl Frye is making great progress and remains motivated to participate, pt seen with PT and RN to safely progress mobility. Pt switched to batteries, however he was unable to don harness due to A-line, PT secured monitors and batteries during mobility. Overall he stood 2x with min A +2 and cues to sternal precautions. He ambulated with eva walker with min G and close chair follow and needed 1x sitting rest break, BLEs noted to begin buckling with fatigue. Pt put on 9L [place] for mobility per RN recommendation, no adverse signs/symptoms noted. Unable to get reliable pleth. Pt placed back on HHFNC at the end of the session. OT to continue to follow. POC remains appropriate.   Flow 4.1 liters/min, speed 5350, power 3.8, PI 4.2    Recommendations for follow up therapy are one component of a multi-disciplinary discharge planning process, led by the attending physician.  Recommendations may be updated based on patient status, additional functional criteria and insurance authorization.    Follow Up Recommendations  Acute inpatient rehab (3hours/day)     Assistance Recommended at Discharge Frequent or constant Supervision/Assistance  Patient can return home with the following  Assist for transportation;Assistance with cooking/housework;A lot of help with walking and/or transfers;A lot of help with bathing/dressing/bathroom   Equipment Recommendations  None recommended by OT       Precautions / Restrictions Precautions Precautions: Fall;Sternal Precaution Booklet Issued: No Precaution Comments: LVAD, Chest Tube,  HHFNC Restrictions Weight Bearing Restrictions: No Other Position/Activity Restrictions: UE sternal precautions       Mobility Bed Mobility Overal bed mobility: Needs Assistance Bed Mobility: Sit to Supine       Sit to supine: +2 for physical assistance, Mod assist   General bed mobility comments: Received OOB in chair. Assist for BLE's back into bed. Increased time    Transfers Overall transfer level: Needs assistance Equipment used: None Transfers: Sit to/from Stand Sit to Stand: Min assist, +2 physical assistance, Mod assist           General transfer comment: Min-modA + 2 to rise from recliner, pt holding onto sternal pillow, rocking to gain momentum.     Balance Overall balance assessment: Needs assistance Sitting-balance support: Feet supported Sitting balance-Leahy Scale: Fair     Standing balance support: Bilateral upper extremity supported Standing balance-Leahy Scale: Poor Standing balance comment: reliant on external support                           ADL either performed or assessed with clinical judgement   ADL Overall ADL's : Needs assistance/impaired Eating/Feeding: Set up;Bed level               Upper Body Dressing : Moderate assistance Upper Body Dressing Details (indicate cue type and reason): for sternal precautions     Toilet Transfer: Minimal assistance;+2 for physical assistance;+2 for safety/equipment;Ambulation Toilet Transfer Details (indicate cue type and reason): eva walker. simulated.         Functional mobility during ADLs: Minimal assistance;+2 for physical assistance;+2 for safety/equipment General ADL Comments: cues throughout to maintain sternal precautions. +2 used  for line management, LVAD management and chair follow.    Extremity/Trunk Assessment Upper Extremity Assessment Upper Extremity Assessment: Generalized weakness (difficult to fully assess due to sternal precautions)   Lower Extremity  Assessment Lower Extremity Assessment: Defer to PT evaluation        Vision   Vision Assessment?: No apparent visual deficits   Perception Perception Perception: Within Functional Limits   Praxis Praxis Praxis: Intact    Cognition Arousal/Alertness: Awake/alert Behavior During Therapy: WFL for tasks assessed/performed Overall Cognitive Status: Within Functional Limits for tasks assessed                                 General Comments: very motivated              General Comments VSS. RN present and assisting. tolerated 9L [place] during mobility, returned to [facility] at the end of the session    Pertinent Vitals/ Pain       Pain Assessment Pain Assessment: Faces Faces Pain Scale: Hurts a little bit Pain Location: incisional Pain Descriptors / Indicators: Tender Pain Intervention(s): Limited activity within patient's tolerance, Monitored during session   Frequency  Min 2X/week        Progress Toward Goals  OT Goals(current goals can now be found in the care plan section)  Progress towards OT goals: Progressing toward goals  Acute Rehab OT Goals Patient Stated Goal: to get better OT Goal Formulation: With patient Time For Goal Achievement: 07/21/22 Potential to Achieve Goals: Good ADL Goals Pt Will Perform Grooming: with supervision;standing Pt Will Perform Upper Body Dressing: with supervision;sitting Pt Will Perform Lower Body Dressing: with min guard assist;sit to/from stand Pt Will Transfer to Toilet: with min guard assist;ambulating;regular height toilet  Plan Discharge plan remains appropriate    Co-evaluation    PT/OT/SLP Co-Evaluation/Treatment: Yes Reason for Co-Treatment: Complexity of the patient's impairments (multi-system involvement);For patient/therapist safety;To address functional/ADL transfers PT goals addressed during session: Mobility/safety with mobility OT goals addressed during session: ADL's and self-care      AM-PAC  OT "6 Clicks" Daily Activity     Outcome Measure   Help from another person eating meals?: None Help from another person taking care of personal grooming?: None Help from another person toileting, which includes using toliet, bedpan, or urinal?: A Lot Help from another person bathing (including washing, rinsing, drying)?: A Lot Help from another person to put on and taking off regular upper body clothing?: A Little Help from another person to put on and taking off regular lower body clothing?: A Lot 6 Click Score: 17    End of Session Equipment Utilized During Treatment: Oxygen;Other (comment) (eva)  OT Visit Diagnosis: Unsteadiness on feet (R26.81);Muscle weakness (generalized) (M62.81);Pain   Activity Tolerance Patient tolerated treatment well   Patient Left in bed;with call bell/phone within reach   Nurse Communication Mobility status        Time: 6378-5885 OT Time Calculation (min): 37 min  Charges: OT General Charges $OT Visit: 1 Visit OT Treatments $Therapeutic Activity: 8-22 mins    [name] 07/10/2022, 11:13 AM

## 2022-07-10 NOTE — Progress Notes (Addendum)
Inpatient Rehab Coordinator Note:  I met with patient and daughter at bedside and wife by phone to discuss CIR recommendations and goals/expectations of CIR stay.  We reviewed 3 hrs/day of therapy, physician follow up, and average length of stay 2 weeks (dependent upon progress) with goals of mod I. Patient and wife in agreement. She and patient's friend, Fayette Pho, will be able to provide 24/7 support when patient discharges home. Will continue to follow for potential admission for CIR. Patient currently on HFNC and continuous cardiac IV medications.    Rehab Admissons Coordinator [place], [place], [place] [phone]

## 2022-07-10 NOTE — Progress Notes (Signed)
CSW visited bedside although patient resting comfortably and CSW did not wake. No family at bedside. CSW will continue to follow. Raquel Sarna, [place], [place]

## 2022-07-10 NOTE — Progress Notes (Addendum)
Physical Therapy Treatment Patient Details Name: Darryl Frye MRN: [id number] DOB: 1960/08/30 Today's Date: 07/10/2022   History of Present Illness Pt is a 61 y.o. male presenting 06/27/22 with CHF for RHC; admitted for inotrope and diuresis.  Other PMH includes NICM s/p ICD, aflutter, CAD, DM, DVT, HTN, heart murmur, renal disorder.  S/p LVAD 11/14.    PT Comments    Pt making excellent progress towards his physical therapy goals and is eager to participate. Received up in chair. Switched pt over to LVAD batteries and from [facility] to 9L O2 via HFNC via respiratory and RN recommendations. Could not obtain accurate pleth, but pt in seemingly no respiratory distress. HR 95-102. Pt ambulating 25 ft x 2 with Harmon Pier walker, +3 assist for pt and equipment management. Seated rest break in between bouts. Pt continues with functional weakness, impaired standing balance, and decreased activity tolerance. He is a great candidate for AIR based on his age, PLOF, and motivation.   Flow 4.1 liters/min, speed 5350, power 3.8, PI 4.2    Recommendations for follow up therapy are one component of a multi-disciplinary discharge planning process, led by the attending physician.  Recommendations may be updated based on patient status, additional functional criteria and insurance authorization.  Follow Up Recommendations  Acute inpatient rehab (3hours/day)     Assistance Recommended at Discharge Frequent or constant Supervision/Assistance  Patient can return home with the following A little help with walking and/or transfers;A little help with bathing/dressing/bathroom;Assistance with cooking/housework;Assist for transportation;Help with stairs or ramp for entrance   Equipment Recommendations  Rolling walker (2 wheels);BSC/3in1    Recommendations for Other Services Rehab consult     Precautions / Restrictions Precautions Precautions: Fall;Sternal Precaution Booklet Issued: No Precaution Comments: LVAD, Chest  Tube, HHFNC Restrictions Weight Bearing Restrictions: Yes (sternal precautions)     Mobility  Bed Mobility Overal bed mobility: Needs Assistance Bed Mobility: Sit to Supine       Sit to supine: +2 for physical assistance, Mod assist   General bed mobility comments: Received OOB in chair. Assist for BLE's back into bed. Increased time    Transfers Overall transfer level: Needs assistance Equipment used: None Transfers: Sit to/from Stand Sit to Stand: Min assist, +2 physical assistance, Mod assist           General transfer comment: Min-modA + 2 to rise from recliner, pt holding onto sternal pillow, rocking to gain momentum.    Ambulation/Gait Ambulation/Gait assistance: Min assist, +2 physical assistance (+3 safety/equipment, chair follow) Gait Distance (Feet): 50 Feet (25", 25") Assistive device: Ethelene Hal Gait Pattern/deviations: Step-through pattern, Decreased stride length Gait velocity: decreased Gait velocity interpretation: <1.8 ft/sec, indicate of risk for recurrent falls   General Gait Details: Verbal cueing for upright posture, glute/quad activation, and scapular retraction. Pt with increased bilateral knee instability with distance. Needs cues for activity pacing and when to rest   Stairs             Wheelchair Mobility    Modified Rankin (Stroke Patients Only)       Balance Overall balance assessment: Needs assistance Sitting-balance support: Feet supported Sitting balance-Leahy Scale: Fair     Standing balance support: Bilateral upper extremity supported Standing balance-Leahy Scale: Poor Standing balance comment: reliant on external support                            Cognition Arousal/Alertness: Awake/alert Behavior During Therapy: WFL for tasks assessed/performed Overall  Cognitive Status: Within Functional Limits for tasks assessed                                 General Comments: very motivated         Exercises      General Comments        Pertinent Vitals/Pain Pain Assessment Pain Assessment: Faces Faces Pain Scale: Hurts a little bit Pain Location: incisional Pain Descriptors / Indicators: Tender Pain Intervention(s): Monitored during session    Home Living                          Prior Function            PT Goals (current goals can now be found in the care plan section) Acute Rehab PT Goals Patient Stated Goal: feel better PT Goal Formulation: With patient Time For Goal Achievement: 07/22/22 Potential to Achieve Goals: Good Progress towards PT goals: Progressing toward goals    Frequency    Min 3X/week      PT Plan Current plan remains appropriate    Co-evaluation PT/OT/SLP Co-Evaluation/Treatment: Yes Reason for Co-Treatment: Complexity of the patient's impairments (multi-system involvement);For patient/therapist safety;To address functional/ADL transfers PT goals addressed during session: Mobility/safety with mobility        AM-PAC PT "6 Clicks" Mobility   Outcome Measure  Help needed turning from your back to your side while in a flat bed without using bedrails?: A Little Help needed moving from lying on your back to sitting on the side of a flat bed without using bedrails?: A Lot Help needed moving to and from a bed to a chair (including a wheelchair)?: A Lot Help needed standing up from a chair using your arms (e.g., wheelchair or bedside chair)?: A Lot Help needed to walk in hospital room?: A Lot Help needed climbing 3-5 steps with a railing? : Total 6 Click Score: 12    End of Session Equipment Utilized During Treatment: Oxygen Activity Tolerance: Patient tolerated treatment well Patient left: in bed;with call bell/phone within reach Nurse Communication: Mobility status PT Visit Diagnosis: Unsteadiness on feet (R26.81);Difficulty in walking, not elsewhere classified (R26.2)     Time: 3818-2993 PT Time Calculation (min)  (ACUTE ONLY): 41 min  Charges:  $Gait Training: 8-22 mins $Therapeutic Activity: 8-22 mins                     Wyona Almas, PT, DPT Acute Rehabilitation Services Office [phone]    [name] 07/10/2022, 10:52 AM

## 2022-07-10 NOTE — Progress Notes (Signed)
TCTS Progress Note: Doing very well s/p LVAD  Sitting up in chair, talkative, NAD   LVAD INTERROGATION:  HeartMate III LVAD:   Flow 4.2 liters/min, speed 5300, power 3.9, PI 3.8.    TTE over the weekend - small LV. Would not go much above speed 5300.      Latest Ref Rng & Units 07/10/2022    6:38 AM 07/10/2022    3:55 AM 07/10/2022    2:00 AM  CBC  WBC 4.0 - 10.5 K/uL  11.0    Hemoglobin 13.0 - 17.0 g/dL 9.5  9.4  10.2   Hematocrit 39.0 - 52.0 % 28.0  28.0  30.0   Platelets 150 - 400 K/uL  271         Latest Ref Rng & Units 07/10/2022    6:38 AM 07/10/2022    3:55 AM 07/10/2022    2:00 AM  CMP  Glucose 70 - 99 mg/dL  146    BUN 6 - 20 mg/dL  21    Creatinine 0.61 - 1.24 mg/dL  1.00    Sodium 135 - 145 mmol/L 133  135  133   Potassium 3.5 - 5.1 mmol/L 3.4  3.7  3.7   Chloride 98 - 111 mmol/L  97    CO2 22 - 32 mmol/L  27    Calcium 8.9 - 10.3 mg/dL  8.5      ABG    Component Value Date/Time   PHART 7.523 (H) 07/10/2022 0638   PCO2ART 31.8 (L) 07/10/2022 0638   PO2ART 67 (L) 07/10/2022 0638   HCO3 26.2 07/10/2022 0638   TCO2 27 07/10/2022 0638   ACIDBASEDEF 2.0 07/07/2022 0510   O2SAT 95 07/10/2022 0638    FiO2 (%):  [40 %-60 %] 55 %   A/P   N no issues. Po pain meds CV: On epi/milrinone low dose Resp: supplemental 02. Requiring more than I would expect with low po2 50s-60s.  Lung fields not clear.  GI: no issues. Has had BM s/p surgery GU: completed large diuresis yest, still onging. Cr stable at 1.0 I/O last 3 completed shifts: In: 8676 [P.O.:1802; I.V.:2429; IV Piggyback:955] Out: 1950 [Urine:9495; Chest Tube:30] Heme: on ASA 81, DC heparin as INR 2.4 ID: WC [id number] , on vanc/cefepime Endo: per critical care T/l/d: consider removing remaining chest tube tomorrow depending on INR   Expected course of ICU through later in the week and then floor over the weekend for 1 week then [place] MD CV Surgery

## 2022-07-10 NOTE — Progress Notes (Signed)
Drive Line: Dressing change performed by caregiver Darryl Frye with VAD Coordinator guidance. Existing VAD dressing removed and site care performed using sterile technique. Drive line exit site cleaned with Chlora prep applicators x 2, allowed to dry, and gauze dressing with silver strip applied. Exit site unincorporated, the velour is fully implanted at exit site. 1 blue sutures, and 1 black suture in place. Slight bruising at exit site. Small amount of serosanguinous drainage on previous dressing/silver strip. No redness, tenderness,  foul odor or rash noted. Drive line anchor re-applied. Next dressing change tomorrow 07/11/22. Darryl Frye to perform at 1300.

## 2022-07-10 NOTE — Progress Notes (Incomplete)
   NAME:  Darryl Frye, MRN:  [id number], DOB:  Mar 07, 1961, LOS: 21 ADMISSION DATE:  06/27/2022, CONSULTATION DATE:  11/14 REFERRING MD:  Aundra Dubin, CHIEF COMPLAINT:  critical care support s/p LVAD   History of Present Illness:  61 year old male w/ sig h/o NICM (EF 20-25%), af/flutter. Presented 11/14 for LVAD after failing out-pt medical therapies. Initial flow 4.5 l/min speed 5100 power 3.6 P1 2.3  Pertinent  Medical History  Afib/flutter s/p ablation May 2023 NICM EF 20-25% has ICD Remote h/o heavy ETOH and cocaine Class IV NYHF sxs.   Significant Hospital Events: Including procedures, antibiotic start and stop dates in addition to other pertinent events   11/14 LVAD placed. Returned to ICU full vent support. On iNO 20ppm, Returned to ICU post-op intubated on Neo, epi 6, and amio gtt. Initial hemodynamics PA 34/20 CO 4.9 CI 2.4 co ox 70% l CVP 10-. On full vent support. PCCM asked to assist w/ care  11/15 iNO weaned off. Got lasix. Pump speed increased to 5200. Still on epi and milrione. Awake following commands 11/15 attempted SBT but saturations dropped 11/16 extubated  11/17 mobilizations, remains on pressors   Interim History / Subjective:   Patient remains critically ill on vasopressors. LVAD in place.  Objective   Blood pressure 101/72, pulse 96, temperature 98.9 F (37.2 C), temperature source Oral, resp. rate (Abnormal) 25, height 5\' 11"  (1.803 m), weight 93.1 kg, SpO2 91 %. CVP:  [9 mmHg-15 mmHg] 15 mmHg  FiO2 (%):  [40 %-60 %] 55 %   Intake/Output Summary (Last 24 hours) at 07/10/2022 1012 Last data filed at 07/10/2022 0900 Gross per 24 hour  Intake 2787.29 ml  Output 6665 ml  Net -3877.71 ml   Filed Weights   07/08/22 0500 07/09/22 0500 07/10/22 0500  Weight: 94 kg 93.7 kg 93.1 kg   Examination: General: Middle-age male resting in chair, was able to get up to bedside commode, HENT NCAT, tracking appropriately Heart: Hum from LVAD, midline sternal  incision Pulm: Diminished breath sounds bile laterally no crackles Abd: Soft nontender nondistended Ext: Warm dry no edema Neuro: Alert following commands GU: Clear urine in Foley  Ancillary tests personally reviewed:     Assessment & Plan:   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 s/p LVAD HM3 Plan Cont sidenafil, dig per HF Milrinone and epi per adv HF team  Post-operative coagulapathy  Plan Cont to monitor  Acute Hypoxemic Respiratory Failure  S/p extubation  Plan Pulm hygiene Pulse ox  Wean O2  H/o afib/flutter s/p prior ablation -previously on DOAC, currently in NSR Cont tele Plan Amiodarone per cards   H/o DVT Plan Coumadin per pharmacy   Best Practice (right click and "Reselect all SmartList Selections" daily)   Diet/type: NPO  Consider tube feeds if not extubated tomorrow.  DVT prophylaxis: SCD - fixed dose heparin, start warfarin once extubated.  GI prophylaxis: H2B Lines: Central line Foley:  Yes, and it is still needed Code Status:  limited Last date of multidisciplinary goals of care discussion [NA]

## 2022-07-11 DIAGNOSIS — Z95811 Presence of heart assist device: Secondary | ICD-10-CM | POA: Diagnosis not present

## 2022-07-11 DIAGNOSIS — R570 Cardiogenic shock: Secondary | ICD-10-CM | POA: Diagnosis not present

## 2022-07-11 DIAGNOSIS — Z9889 Other specified postprocedural states: Secondary | ICD-10-CM | POA: Diagnosis not present

## 2022-07-11 DIAGNOSIS — I5023 Acute on chronic systolic (congestive) heart failure: Secondary | ICD-10-CM | POA: Diagnosis not present

## 2022-07-11 LAB — CBC WITH DIFFERENTIAL/PLATELET
Abs Immature Granulocytes: 0.26 10*3/uL — ABNORMAL HIGH (ref 0.00–0.07)
Basophils Absolute: 0.1 10*3/uL (ref 0.0–0.1)
Basophils Relative: 0 %
Eosinophils Absolute: 0.5 10*3/uL (ref 0.0–0.5)
Eosinophils Relative: 4 %
HCT: 27.2 % — ABNORMAL LOW (ref 39.0–52.0)
Hemoglobin: 9.3 g/dL — ABNORMAL LOW (ref 13.0–17.0)
Immature Granulocytes: 2 %
Lymphocytes Relative: 6 %
Lymphs Abs: 0.8 10*3/uL (ref 0.7–4.0)
MCH: 27.5 pg (ref 26.0–34.0)
MCHC: 34.2 g/dL (ref 30.0–36.0)
MCV: 80.5 fL (ref 80.0–100.0)
Monocytes Absolute: 1.6 10*3/uL — ABNORMAL HIGH (ref 0.1–1.0)
Monocytes Relative: 10 %
Neutro Abs: 11.9 10*3/uL — ABNORMAL HIGH (ref 1.7–7.7)
Neutrophils Relative %: 78 %
Platelets: 306 10*3/uL (ref 150–400)
RBC: 3.38 MIL/uL — ABNORMAL LOW (ref 4.22–5.81)
RDW: 14.6 % (ref 11.5–15.5)
WBC: 15.1 10*3/uL — ABNORMAL HIGH (ref 4.0–10.5)
nRBC: 1.3 % — ABNORMAL HIGH (ref 0.0–0.2)

## 2022-07-11 LAB — PROTIME-INR
INR: 2.2 — ABNORMAL HIGH (ref 0.8–1.2)
Prothrombin Time: 23.8 seconds — ABNORMAL HIGH (ref 11.4–15.2)

## 2022-07-11 LAB — POCT I-STAT 7, (LYTES, BLD GAS, ICA,H+H)
Acid-Base Excess: 12 mmol/L — ABNORMAL HIGH (ref 0.0–2.0)
Bicarbonate: 34.7 mmol/L — ABNORMAL HIGH (ref 20.0–28.0)
Calcium, Ion: 1.11 mmol/L — ABNORMAL LOW (ref 1.15–1.40)
HCT: 30 % — ABNORMAL LOW (ref 39.0–52.0)
Hemoglobin: 10.2 g/dL — ABNORMAL LOW (ref 13.0–17.0)
O2 Saturation: 96 %
Patient temperature: 37.4
Potassium: 4.1 mmol/L (ref 3.5–5.1)
Sodium: 129 mmol/L — ABNORMAL LOW (ref 135–145)
TCO2: 36 mmol/L — ABNORMAL HIGH (ref 22–32)
pCO2 arterial: 37.8 mmHg (ref 32–48)
pH, Arterial: 7.573 — ABNORMAL HIGH (ref 7.35–7.45)
pO2, Arterial: 71 mmHg — ABNORMAL LOW (ref 83–108)

## 2022-07-11 LAB — PHOSPHORUS: Phosphorus: 2.8 mg/dL (ref 2.5–4.6)

## 2022-07-11 LAB — GLUCOSE, CAPILLARY
Glucose-Capillary: 123 mg/dL — ABNORMAL HIGH (ref 70–99)
Glucose-Capillary: 280 mg/dL — ABNORMAL HIGH (ref 70–99)
Glucose-Capillary: 197 mg/dL — ABNORMAL HIGH (ref 70–99)
Glucose-Capillary: 256 mg/dL — ABNORMAL HIGH (ref 70–99)
Glucose-Capillary: 189 mg/dL — ABNORMAL HIGH (ref 70–99)

## 2022-07-11 LAB — BASIC METABOLIC PANEL
Anion gap: 15 (ref 5–15)
BUN: 17 mg/dL (ref 6–20)
CO2: 28 mmol/L (ref 22–32)
Calcium: 8.7 mg/dL — ABNORMAL LOW (ref 8.9–10.3)
Chloride: 91 mmol/L — ABNORMAL LOW (ref 98–111)
Creatinine, Ser: 1.07 mg/dL (ref 0.61–1.24)
GFR, Estimated: >60 mL/min (ref >60)
Glucose, Bld: 135 mg/dL — ABNORMAL HIGH (ref 70–99)
Potassium: 3.9 mmol/L (ref 3.5–5.1)
Sodium: 134 mmol/L — ABNORMAL LOW (ref 135–145)

## 2022-07-11 LAB — LACTATE DEHYDROGENASE: LDH: 368 U/L — ABNORMAL HIGH (ref 98–192)

## 2022-07-11 LAB — COOXEMETRY PANEL
Carboxyhemoglobin: 1.6 % — ABNORMAL HIGH (ref 0.5–1.5)
Methemoglobin: <0.7 % (ref 0.0–1.5)
O2 Saturation: 57.2 %
Total hemoglobin: 11.1 g/dL — ABNORMAL LOW (ref 12.0–16.0)

## 2022-07-11 LAB — BRAIN NATRIURETIC PEPTIDE: B Natriuretic Peptide: 679.3 pg/mL — ABNORMAL HIGH (ref 0.0–100.0)

## 2022-07-11 LAB — MAGNESIUM: Magnesium: 1.8 mg/dL (ref 1.7–2.4)

## 2022-07-11 MED ORDER — POTASSIUM CHLORIDE CRYS ER 20 MEQ PO TBCR
40.0000 meq | EXTENDED_RELEASE_TABLET | Freq: Once | ORAL | Status: AC
  Administered 2022-07-11: 40 meq via ORAL
  Filled 2022-07-11: qty 2

## 2022-07-11 MED ORDER — MAGNESIUM SULFATE 4 GM/100ML IV SOLN
4.0000 g | Freq: Once | INTRAVENOUS | Status: AC
  Administered 2022-07-11: 4 g via INTRAVENOUS
  Filled 2022-07-11: qty 100

## 2022-07-11 MED ORDER — WARFARIN SODIUM 2 MG PO TABS
2.0000 mg | ORAL_TABLET | Freq: Once | ORAL | Status: AC
  Administered 2022-07-11: 2 mg via ORAL
  Filled 2022-07-11: qty 1

## 2022-07-11 MED ORDER — TRANEXAMIC ACID FOR EPISTAXIS
500.0000 mg | Freq: Once | TOPICAL | Status: DC | PRN
  Filled 2022-07-11: qty 10

## 2022-07-11 MED ORDER — INSULIN GLARGINE-YFGN 100 UNIT/ML ~~LOC~~ SOLN
20.0000 [IU] | Freq: Two times a day (BID) | SUBCUTANEOUS | Status: DC
  Administered 2022-07-11 – 2022-07-12 (×3): 20 [IU] via SUBCUTANEOUS
  Filled 2022-07-11 (×4): qty 0.2

## 2022-07-11 MED ORDER — OXYMETAZOLINE HCL 0.05 % NA SOLN
2.0000 | Freq: Two times a day (BID) | NASAL | Status: AC | PRN
  Administered 2022-07-12: 2 via NASAL
  Filled 2022-07-11: qty 30

## 2022-07-11 MED ORDER — MAGNESIUM SULFATE 2 GM/50ML IV SOLN: 2.0000 g | Freq: Once | INTRAVENOUS | Status: DC

## 2022-07-11 MED ORDER — MILRINONE LACTATE IN DEXTROSE 20-5 MG/100ML-% IV SOLN
0.1250 ug/kg/min | INTRAVENOUS | Status: DC
  Administered 2022-07-11 (×2): 0.25 ug/kg/min via INTRAVENOUS
  Administered 2022-07-12 – 2022-07-16 (×4): 0.125 ug/kg/min via INTRAVENOUS
  Filled 2022-07-11 (×6): qty 100

## 2022-07-11 NOTE — Progress Notes (Addendum)
Patient ID: Darryl Frye, male   DOB: May 13, 1961, 61 y.o.   MRN: [id number] Advanced Heart Failure VAD Team Note  PCP-Cardiologist: Carlyle Dolly, MD   Subjective:    - 11/13 S/P HMIII L atrial appendage closure.  - 11/16 Extubated . GIven 40 mg IV and started on lasix drip 10 mg per/hr.  - 11/17 Developed A fib RVR. Given bolus 150 x 2. Unchanged.  - 11/18 Bedside echo 11/18 LV small. VAD cannula well aligned. AoV opening every beat. Septum pulling to left RV moderately HK. Small to moderate anterior effusion  Milrinone 0.375 mcg + amio 30 mg + lasix drip at 10 mg. Brisk diuresis over the last 24 hours.   Remains in NSR. INR 2.2  on warfarin  Remains on vancomycin/cefepime.  Afebrile.    Walked 50 feet. Oxygen requirement down to 8 liters.   LVAD INTERROGATION:  HeartMate III LVAD:   Flow 4.2 liters/min, speed 5300, power 4 , PI 3.9   3 PI events.  LDH 368  Objective:    Vital Signs:   Temp:  [98.3 F (36.8 C)-99.5 F (37.5 C)] 99.3 F (37.4 C) (11/20 2300) Pulse Rate:  [89-113] 100 (11/21 0430) Resp:  [18-32] 18 (11/21 0430) BP: (120)/(81) 120/81 (11/20 1406) SpO2:  [34 %-98 %] 91 % (11/21 0430) Arterial Line BP: (95-119)/(64-85) 103/68 (11/21 0430) FiO2 (%):  [50 %-60 %] 50 % (11/20 1600) Weight:  [90.9 kg] 90.9 kg (11/21 0500) Last BM Date : 07/09/22 Mean arterial Pressure 80s   Intake/Output:   Intake/Output Summary (Last 24 hours) at 07/11/2022 0706 Last data filed at 07/11/2022 0630 Gross per 24 hour  Intake 3407.59 ml  Output 6435 ml  Net -3027.41 ml     Physical Exam  CVP 12-13 Physical Exam: GENERAL: No acute distress. In the chair HEENT: normal  NECK: Supple, JVP  11-12 .  2+ bilaterally, no bruits.  No lymphadenopathy or thyromegaly appreciated.  RIJ swan sleeve CARDIAC:  Mechanical heart sounds with LVAD hum present. Sternal dressing  LUNGS:  Crackles in bases. On 8 liters. .  ABDOMEN:  Soft, round, nontender, positive bowel sounds x4.      LVAD exit site: well-healed and incorporated.  Dressing dry and intact.  No erythema or drainage.  Stabilization device present and accurately applied.  Driveline dressing is being changed daily per sterile technique. EXTREMITIES:  Warm and dry, no cyanosis, clubbing, rash or edema . L radial a line. RUE PICC  NEUROLOGIC:  Alert and oriented x 3.    No aphasia.  No dysarthria.  Affect pleasant.    GU: foley     Telemetry  NSR 90s   Labs   Basic Metabolic Panel: Recent Labs  Lab 07/07/22 0354 07/07/22 0510 07/07/22 1557 07/08/22 0408 07/08/22 0421 07/09/22 0259 07/09/22 7741 07/09/22 2878 07/09/22 2012 07/10/22 0200 07/10/22 0355 07/10/22 6767 07/10/22 1404 07/11/22 0426 07/11/22 0617  NA 137   < > 135 136   < > 136 135   < > 134*   < > 135 133* 134* 134* 129*  K 3.8   < > 3.7 3.0*   < > 3.0* 3.0*   < > 3.5   < > 3.7 3.4* 3.8 3.9 4.1  CL 102  --  101 97*   < > 96* 95*  --  99  --  97*  --  92* 91*  --   CO2 21*  --  22 23   < > 26  26  --  26  --  27  --  26 28  --   GLUCOSE 206*  --  180* 151*   < > 126* 118*  --  86  --  146*  --  187* 135*  --   BUN 28*  --  30* 25*   < > 25* 22*  --  23*  --  21*  --  19 17  --   CREATININE 1.37*  --  1.40* 1.27*   < > 1.18 1.08  --  1.05  --  1.00  --  1.10 1.07  --   CALCIUM 8.4*  --  8.3* 8.4*   < > 8.2* 8.2*  --  8.2*  --  8.5*  --  8.7* 8.7*  --   MG 2.3  --  2.2 2.0  --  1.9  --   --   --   --  1.8  --   --  1.8  --   PHOS 3.5  --   --  3.3  --  2.1*  --   --   --   --  1.5*  --   --  2.8  --    < > = values in this interval not displayed.    Liver Function Tests: Recent Labs  Lab 07/05/22 0401 07/06/22 0414 07/07/22 0354  AST 90* 102* 57*  ALT 25 27 21   ALKPHOS 70 74 89  BILITOT 0.8 0.6 0.7  PROT 6.5 6.4* 6.6  ALBUMIN 3.6 3.1* 2.9*   No results for input(s): "LIPASE", "AMYLASE" in the last 168 hours. No results for input(s): "AMMONIA" in the last 168 hours.  CBC: Recent Labs  Lab 07/07/22 0354 07/07/22 0510  07/08/22 0408 07/08/22 0421 07/09/22 0259 07/09/22 7035 07/10/22 0200 07/10/22 0355 07/10/22 0093 07/11/22 0426 07/11/22 0617  WBC 20.9*  --  17.4*  --  11.5*  --   --  11.0*  --  15.1*  --   NEUTROABS 18.6*  --  15.2*  --  9.3*  --   --  8.4*  --  11.9*  --   HGB 11.4*   < > 10.3*   < > 9.4*   < > 10.2* 9.4* 9.5* 9.3* 10.2*  HCT 33.1*   < > 29.6*   < > 26.2*   < > 30.0* 28.0* 28.0* 27.2* 30.0*  MCV 83.6  --  82.0  --  80.9  --   --  82.1  --  80.5  --   PLT 222  --  220  --  211  --   --  271  --  306  --    < > = values in this interval not displayed.    INR: Recent Labs  Lab 07/07/22 0354 07/08/22 0900 07/09/22 0259 07/10/22 0355 07/11/22 0426  INR 1.4* 1.5* 3.0* 2.4* 2.2*  :    Imaging   DG Abd 1 View  Result Date: 07/10/2022 CLINICAL DATA:  Abdominal distension. EXAM: ABDOMEN - 1 VIEW COMPARISON:  07/04/2022 FINDINGS: LVAD device is identified with in the projection of the left lower chest. Mediastinal drain is again noted. No pathologic dilatation of the large or small bowel loops. There is a mildly dilated loop of colon within the projection of the lower abdomen which appears unchanged from the previous exam. Gas noted throughout the colon up to the level of the rectum. IMPRESSION: 1. Nonobstructive bowel gas pattern. 2. Stable mildly dilated  loop of colon within the lower abdomen. Electronically Signed   By: Kerby Moors M.D.   On: 07/10/2022 06:00   DG CHEST PORT 1 VIEW  Result Date: 07/10/2022 CLINICAL DATA:  LVAD device in place.  Shortness of breath. EXAM: PORTABLE CHEST 1 VIEW COMPARISON:  07/08/2022 FINDINGS: There is a right IJ catheter with tip in the projection of the mid SVC. Interval removal of pulmonary arterial catheter. Right arm PICC line tip is at the superior cavoatrial junction. Left chest wall ICD noted with leads in the right atrial appendage and right ventricle. Left atrial clip. LVAD device is again noted. Stable cardiomediastinal contours.  Unchanged appearance of bilateral interstitial and airspace opacities. IMPRESSION: 1. Interval removal of pulmonary arterial catheter. Support apparatus otherwise unchanged. 2. No change in aeration to the lungs compared with previous exam. Electronically Signed   By: Kerby Moors M.D.   On: 07/10/2022 05:57     Medications:     Scheduled Medications:  sodium chloride   Intravenous Once   aspirin  81 mg Oral Daily   atorvastatin  20 mg Oral Daily   bisacodyl  10 mg Oral Daily   Or   bisacodyl  10 mg Rectal Daily   Chlorhexidine Gluconate Cloth  6 each Topical Daily   digoxin  0.125 mg Oral Daily   feeding supplement  237 mL Oral TID BM   insulin aspart  0-24 Units Subcutaneous Q4H   insulin glargine-yfgn  35 Units Subcutaneous Daily   ipratropium-albuterol  3 mL Nebulization Q6H   levothyroxine  25 mcg Oral Q0600   lidocaine  2 patch Transdermal Q24H   magnesium oxide  400 mg Oral BID   melatonin  3 mg Oral QHS   multivitamin with minerals  1 tablet Oral Daily   pantoprazole  40 mg Oral Daily   polyethylene glycol  17 g Oral BID   senna-docusate  1 tablet Oral BID   sertraline  50 mg Oral Daily   sildenafil  20 mg Oral TID   sodium chloride flush  10-40 mL Intracatheter Q12H   sodium chloride flush  10-40 mL Intracatheter Q12H   sodium chloride flush  3 mL Intravenous Q12H   sodium chloride flush  3 mL Intravenous Q12H   spironolactone  25 mg Oral Daily   Warfarin - Physician Dosing Inpatient   Does not apply q1600    Infusions:  sodium chloride Stopped (07/07/22 1346)   sodium chloride     sodium chloride     amiodarone 30 mg/hr (07/11/22 0600)   ceFEPime (MAXIPIME) IV 2 g (07/11/22 0609)   furosemide (LASIX) 200 mg in dextrose 5 % 100 mL (2 mg/mL) infusion 10 mg/hr (07/11/22 0600)   lactated ringers     lactated ringers Stopped (07/04/22 2207)   lactated ringers 20 mL/hr at 07/11/22 0600   milrinone 0.375 mcg/kg/min (07/11/22 0600)   norepinephrine (LEVOPHED) Adult  infusion Stopped (07/06/22 0221)   vancomycin Stopped (07/10/22 2203)    PRN Medications: sodium chloride, acetaminophen, ALPRAZolam, dextrose, hydrALAZINE, lactated ringers, morphine injection, ondansetron (ZOFRAN) IV, ondansetron (ZOFRAN) IV, mouth rinse, oxyCODONE, sodium chloride flush, sodium chloride flush, sodium chloride flush, traMADol   Patient Profile   Admitted with A/C HFrEF --> cardiogenic shock. S/P HMIII LVAD  Assessment/Plan:    1. Acute on chronic systolic CHF/cardiogenic shock: Nonischemic cardiomyopathy, diagnosed 2020.  At the time, he drank heavily and used cocaine, so it is possible that this is a substance abuse-related cardiomyopathy though LV  function has remained low even with stopping ETOH and cocaine (denies use x several years).  Cath in 12/20 with no significant coronary disease.  [employer] ICD. Most recent echo in 2/23 showed EF 20-25% with normal RV.  Recently, patient has been symptomatically worse, NYHA class IV with profound orthopnea. RHC showed markedly elevated filling pressures, primarily pulmonary venous hypertension, low cardiac output, and low PAPI.  Patient was admitted for inotrope and diuresis. Echo this admission with EF <20%, mild RV dysfunction.  He was started on milrinone with improvement in hemodynamics.  We proceeded with HM3 LVAD with LAA clip on 11/14.   - Co-ox  57% . Cut back milrinone 0.25 mcg.  - Continue  sildenafil 20 tid.  -  CVP 12-13. Continue lasix drip today.  - Continue 25 mg spiro daily.  - Continue digoxin 0.125 daily.  - Continue sildenafil 20 tid.   - Renal function stable.  2. A fib RVR : H/O S/p flutter ablation.  Back in NSR.  - Continue IV amiodarone 30 mg/hr while on milrinone.  - Continue warfarin.  3. VAD management: s/p HM-3 VAD on 11/14.  Bedside echo 11/18 LV small, VAD cannula well aligned, AoV opening every beat, septum pulling to left RV moderately HK, small to moderate anterior effusion. LDH 387 => [id number].   - Now on warfarin with INR 2.2  - ASA 81. - VAD interrogated personally. Parameters stable. 4. H/o DVT: On anticoagulation.  5. Acute hypoxemic respiratory failure, post-op. 11/16 extubated.  Bilateral lower lobe infiltrates on CXR. CVP trending down.  - Oxygen requirement down to 8 liters.   - Continue broad spectrum abx.  - Continue diuresis - Incentive spirometry.  6. AKI: Resolved.   7. ID:  -Low grade <100  - on vanco/cefepime 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  9. Hypokalemia - Continue spironolactone.  - Replace K.  10. Deconditioning - PT/OT following  - CIR  Give 2 grams Mag.   Amy Clegg NP-C 7:06 AM  Length of Stay: [place], NP 07/11/2022, 7:06 AM  VAD Team --- VAD ISSUES ONLY--- Pager [phone] (7am - 7am) Advanced Heart Failure Team  Pager [phone] (M-F; 7a - 5p)  Please contact [facility] for night-coverage after hours (5p -7a ) and weekends on [email or web address]  Patient seen with NP, agree with the above note.   He diuresed well again, weight down 5 lbs.  Oxygen down to 8 L [place].  Creatinine stable 1.07.   He remains on Lasix 10 mg/hr, milrinone 0.25.  Co-ox 57%, CVP 12-13.   He is on day 5 cefepime/vancomycin.   NSR on amiodarone gtt.   General: Well appearing this am. NAD.  HEENT: Normal. Neck: Supple, JVP 10-12 cm. Carotids OK.  Cardiac:  Mechanical heart sounds with LVAD hum present.  Lungs:  CTAB, normal effort.  Abdomen:  NT, ND, no HSM. No bruits or masses. +BS  LVAD exit site: Well-healed and incorporated. Dressing dry and intact. No erythema or drainage. Stabilization device present and accurately applied. Driveline dressing changed daily per sterile technique. Extremities:  Warm and dry. No cyanosis, clubbing, rash, or edema.  Neuro:  Alert & oriented x 3. Cranial nerves grossly intact. Moves all 4 extremities w/o difficulty. Affect pleasant     Good diuresis, will continue Lasix gtt at 10 mg/hr.  Weight down 5 lbs, CVP 12-13 and  still with oxygen requirement.   Decrease milrinone to 0.25 today. Continue digoxin and sildenafil.   Think we can  stop abx after today (5 days empiric tx).   Continue incentive spirometry and encourage walking.   CRITICAL CARE Performed by: Loralie Champagne  Total critical care time: 40 minutes  Critical care time was exclusive of separately billable procedures and treating other patients.  Critical care was necessary to treat or prevent imminent or life-threatening deterioration.  Critical care was time spent personally by me on the following activities: development of treatment plan with patient and/or surrogate as well as nursing, discussions with consultants, evaluation of patient's response to treatment, examination of patient, obtaining history from patient or surrogate, ordering and performing treatments and interventions, ordering and review of laboratory studies, ordering and review of radiographic studies, pulse oximetry and re-evaluation of patient's condition.  Loralie Champagne 07/11/2022 7:38 AM

## 2022-07-11 NOTE — Progress Notes (Addendum)
Drive Line: Dressing change performed by caregiver [name] with VAD Coordinator guidance. Existing VAD dressing removed and site care performed using sterile technique. Drive line exit site cleaned with Chlora prep applicators x 2, allowed to dry, and gauze dressing with silver strip applied. Exit site unincorporated, the velour is fully implanted at exit site. 1 blue sutures, and 1 black suture in place. Slight bruising at exit site. Small amount of serosanguinous drainage on previous dressing/silver strip. No redness, tenderness,  foul odor or rash noted. Drive line anchor re-applied. Next dressing change tomorrow 07/12/22. [name] to perform at 1300.

## 2022-07-11 NOTE — Progress Notes (Signed)
NAME:  Darryl Frye, MRN:  [id number], DOB:  04/19/1961, LOS: 61 ADMISSION DATE:  06/27/2022, CONSULTATION DATE:  11/14 REFERRING MD:  Aundra Dubin, CHIEF COMPLAINT:  critical care support s/p LVAD   History of Present Illness:  61 year old male w/ sig h/o NICM (EF 20-25%), af/flutter. Presented 11/14 for LVAD after failing out-pt medical therapies. Initial flow 4.5 l/min speed 5100 power 3.6 P1 2.3  Pertinent  Medical History  Afib/flutter s/p ablation May 2023 NICM EF 20-25% has ICD Remote h/o heavy ETOH and cocaine Class IV NYHF sxs.   Significant Hospital Events: Including procedures, antibiotic start and stop dates in addition to other pertinent events   11/14 LVAD placed. Returned to ICU full vent support. On iNO 20ppm, Returned to ICU post-op intubated on Neo, epi 6, and amio gtt. Initial hemodynamics PA 34/20 CO 4.9 CI 2.4 co ox 70% l CVP 10-. On full vent support. PCCM asked to assist w/ care  11/15 iNO weaned off. Got lasix. Pump speed increased to 5200. Still on epi and milrione. Awake following commands 11/15 attempted SBT but saturations dropped 11/16 extubated  11/17 mobilizations, remains on pressors   Interim History / Subjective:  Stated could not sleep overnight Remains on Amio and Lasix infusion Milrinone was titrated down to 0.25 mics Diuresed well with net -3 L and weight is down 4 pounds  Objective   Blood pressure 120/81, pulse 98, temperature 99.3 F (37.4 C), temperature source Oral, resp. rate (!) 27, height 5\' 11"  (1.803 m), weight 90.9 kg, SpO2 94 %. CVP:  [11 mmHg-15 mmHg] 11 mmHg  FiO2 (%):  [50 %] 50 %   Intake/Output Summary (Last 24 hours) at 07/11/2022 0850 Last data filed at 07/11/2022 0630 Gross per 24 hour  Intake 3152.59 ml  Output 6085 ml  Net -2932.41 ml   Filed Weights   07/09/22 0500 07/10/22 0500 07/11/22 0500  Weight: 93.7 kg 93.1 kg 90.9 kg   Examination: General: Middle-age male sitting in the chair on high flow nasal cannula  oxygen at 8 L HENT NCAT, tracking appropriately Heart: Hum from LVAD, midline sternal incision Pulm: Bilateral expiratory wheezes, no rhonchi or crackles Abd: Soft nontender nondistended Ext: Warm dry no edema Neuro: Alert, awake, following commands GU: Clear urine in Foley Skin: No rash  Ancillary tests personally reviewed:     Assessment & Plan:  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 s/p LVAD HM3 Continue milrinone per heart failure service Continue sildenafil and digoxin Remains on amiodarone and Lasix infusions Weight is down 4 pounds  Acute Hypoxemic Respiratory Failure  Patient remains on high flow nasal cannula oxygen, currently at 8 L Titrate with O2 sat goal 92% Continue PT/OT  Continue incentive spirometry and flutter valve  H/o afib/flutter s/p prior ablation Was previously on DOAC, currently in NSR Cont tele On Amiodarone infusion Continue digoxin Heart rate control per cardiology Continue Coumadin  H/o DVT Continue Coumadin, INR is therapeutic Appreciate pharmacy to see help.  Hyponatremia/hypokalemia/hypophosphatemia/hypocalcemia Continue aggressive electrolyte supplement and monitor Serum sodium remained at 134, other electrolytes are corrected  Acute kidney injury likely due to cardiorenal syndrome Serum creatinine is stable Monitor intake and output Avoid nephrotoxic agents  Best Practice (right click and "Reselect all SmartList Selections" daily)   Diet/type: 2 g sodium diet DVT prophylaxis: Coumadin GI prophylaxis: Protonix Lines: Central line Foley:  Yes, and it is still needed Code Status:  limited Last date of multidisciplinary goals of care discussion [Per  primary team]   Total critical care time: 35 minutes  Performed by: [place] care time was exclusive of separately billable procedures and treating other patients.   Critical care was necessary to treat or prevent imminent or  life-threatening deterioration.   Critical care was time spent personally by me on the following activities: development of treatment plan with patient and/or surrogate as well as nursing, discussions with consultants, evaluation of patient's response to treatment, examination of patient, obtaining history from patient or surrogate, ordering and performing treatments and interventions, ordering and review of laboratory studies, ordering and review of radiographic studies, pulse oximetry and re-evaluation of patient's condition.   Jacky Kindle, MD [facility] See Amion for pager If no response to pager, please call [phone] until 7pm After 7pm, Please call E-link [phone]

## 2022-07-11 NOTE — Progress Notes (Signed)
TCTS Progress Note: POD 7 VAD If INR 2-2.2, likely dc last chest tube tomorrow  No pacing wires to dc  HF management of daily meds otherwise  Pleased with his progress thusfar.      Latest Ref Rng & Units 07/11/2022    6:17 AM 07/11/2022    4:26 AM 07/10/2022    6:38 AM  CBC  WBC 4.0 - 10.5 K/uL  15.1    Hemoglobin 13.0 - 17.0 g/dL 10.2  9.3  9.5   Hematocrit 39.0 - 52.0 % 30.0  27.2  28.0   Platelets 150 - 400 K/uL  306         Latest Ref Rng & Units 07/11/2022    6:17 AM 07/11/2022    4:26 AM 07/10/2022    2:04 PM  CMP  Glucose 70 - 99 mg/dL  135  187   BUN 6 - 20 mg/dL  17  19   Creatinine 0.61 - 1.24 mg/dL  1.07  1.10   Sodium 135 - 145 mmol/L 129  134  134   Potassium 3.5 - 5.1 mmol/L 4.1  3.9  3.8   Chloride 98 - 111 mmol/L  91  92   CO2 22 - 32 mmol/L  28  26   Calcium 8.9 - 10.3 mg/dL  8.7  8.7     ABG    Component Value Date/Time   PHART 7.573 (H) 07/11/2022 0617   PCO2ART 37.8 07/11/2022 0617   PO2ART 71 (L) 07/11/2022 0617   HCO3 34.7 (H) 07/11/2022 0617   TCO2 36 (H) 07/11/2022 0617   ACIDBASEDEF 2.0 07/07/2022 0510   O2SAT 57.2 07/11/2022 0617   O2SAT 96 07/11/2022 0617

## 2022-07-11 NOTE — Progress Notes (Signed)
Inpatient Rehab Admissions Coordinator:   Insurance opened for this patient today for approval for CIR admission when medically ready. Will continue to follow.   Rehab Admissons Coordinator [place], [place], [place] [phone]

## 2022-07-11 NOTE — Progress Notes (Signed)
Physical Therapy Treatment Patient Details Name: Darryl Frye MRN: [id number] DOB: 02/18/1961 Today's Date: 07/11/2022   History of Present Illness Pt is a 61 y.o. male presenting 06/27/22 with CHF for RHC; admitted for inotrope and diuresis.  Other PMH includes NICM s/p ICD, aflutter, CAD, DM, DVT, HTN, heart murmur, renal disorder.  S/p LVAD 11/14.    PT Comments    Pt making steady progress towards his physical therapy goals and remains motivated to participate. Able to participate in seated warm up exercises and ambulate 85 ft with Harmon Pier walker and min assist (+3 safety/equipment). VSS on 8L O2 via HFNC. Education initiated with pt and pt ex wife regarding LVAD management including switching from wall to battery power. Continue to recommend acute inpatient rehab (AIR) for post-acute therapy needs.    Recommendations for follow up therapy are one component of a multi-disciplinary discharge planning process, led by the attending physician.  Recommendations may be updated based on patient status, additional functional criteria and insurance authorization.  Follow Up Recommendations  Acute inpatient rehab (3hours/day)     Assistance Recommended at Discharge Frequent or constant Supervision/Assistance  Patient can return home with the following A little help with walking and/or transfers;A little help with bathing/dressing/bathroom;Assistance with cooking/housework;Assist for transportation;Help with stairs or ramp for entrance   Equipment Recommendations  Rolling walker (2 wheels);BSC/3in1    Recommendations for Other Services Rehab consult     Precautions / Restrictions Precautions Precautions: Fall;Sternal Precaution Booklet Issued: No Precaution Comments: LVAD, Chest Tube, HHFNC Restrictions Weight Bearing Restrictions: Yes (sternal precautions)     Mobility  Bed Mobility Overal bed mobility: Needs Assistance Bed Mobility: Supine to Sit     Supine to sit: Min assist      General bed mobility comments: Pt holding onto sternal pillow, minA at trunk    Transfers Overall transfer level: Needs assistance Equipment used: None Transfers: Sit to/from Stand Sit to Stand: Min assist, +2 safety/equipment           General transfer comment: Pt holding onto sternal pillow, cues for rocking for momentum, minA to power up and steady    Ambulation/Gait Ambulation/Gait assistance: Min assist, +2 physical assistance (+3 safety/equipment, chair follow) Gait Distance (Feet): 85 Feet Assistive device: Ethelene Hal Gait Pattern/deviations: Step-through pattern, Decreased stride length       General Gait Details: Verbal cues for glute activation, upright posture, quad activation, activity pacing. Chair follow utilized   Marine scientist Rankin (Stroke Patients Only)       Balance Overall balance assessment: Needs assistance Sitting-balance support: Feet supported Sitting balance-Leahy Scale: Fair     Standing balance support: Bilateral upper extremity supported Standing balance-Leahy Scale: Poor Standing balance comment: reliant on external support                            Cognition Arousal/Alertness: Awake/alert Behavior During Therapy: WFL for tasks assessed/performed Overall Cognitive Status: Within Functional Limits for tasks assessed                                 General Comments: very motivated        Training and development officer - Lower Extremity Wechter Arc Quad: Both, 10 reps, Seated Hip Flexion/Marching: Both, 10 reps, Seated    General Comments  Pertinent Vitals/Pain Pain Assessment Pain Assessment: Faces Faces Pain Scale: Hurts a little bit Pain Location: incisional Pain Descriptors / Indicators: Tender Pain Intervention(s): Monitored during session    Home Living                          Prior Function            PT Goals (current  goals can now be found in the care plan section) Acute Rehab PT Goals Patient Stated Goal: feel better PT Goal Formulation: With patient Time For Goal Achievement: 07/22/22 Potential to Achieve Goals: Good Progress towards PT goals: Progressing toward goals    Frequency    Min 3X/week      PT Plan Current plan remains appropriate    Co-evaluation              AM-PAC PT "6 Clicks" Mobility   Outcome Measure  Help needed turning from your back to your side while in a flat bed without using bedrails?: A Little Help needed moving from lying on your back to sitting on the side of a flat bed without using bedrails?: A Little Help needed moving to and from a bed to a chair (including a wheelchair)?: A Little Help needed standing up from a chair using your arms (e.g., wheelchair or bedside chair)?: A Little Help needed to walk in hospital room?: A Little Help needed climbing 3-5 steps with a railing? : Total 6 Click Score: 16    End of Session Equipment Utilized During Treatment: Oxygen Activity Tolerance: Patient tolerated treatment well Patient left: in bed;with call bell/phone within reach Nurse Communication: Mobility status PT Visit Diagnosis: Unsteadiness on feet (R26.81);Difficulty in walking, not elsewhere classified (R26.2)     Time: 4098-1191 PT Time Calculation (min) (ACUTE ONLY): 25 min  Charges:  $Gait Training: 8-22 mins $Therapeutic Activity: 8-22 mins                     Darryl Frye, PT, DPT Acute Rehabilitation Services Office [phone]    Darryl Frye 07/11/2022, 5:12 PM

## 2022-07-11 NOTE — Progress Notes (Signed)
LVAD Coordinator Rounding Note:  HM 3 LVAD implanted on 07/04/22 by Dr Tenny Craw under destination criteria due to uncontrolled diabetes. Left atrial appendage clipped at time of surgery.  Pt is lying in bed on my arrival. States he feels good this morning. Pt ambulated this morning. O2 requirements are down to 8L this morning.  Went into a-fib over the weekend. Rate 130s-150s. Has received 4 Amiodarone boluses. Remains in NSR on amio gtt.  Speed increased to 5300 per Dr Tenny Craw Saturday morning. Tolerating well.   Planning to meet Jessica at bedside at 1300 for continued dressing change education.  Vital signs: Temp: 98.6 HR: 97 NSR Doppler Pressure:90 Arterial BP: 95/62 (71) O2 Sat: 94% on 8L Wt: 214.3>208.8>209.2>207.2>206.6>205.3>200 lb  LVAD interrogation reveals:  Speed: 5300 Flow: 4.3 Power: 3.9 w PI: 3.7   Alarms: none Events: rare Hematocrit: 27  Fixed speed: 5300 Low speed limit: 5000   Drive Line: Plan for dressing change this afternoon with caregivers [name] and [employer]. Current dressing clean dry and intact. Anchor applied correctly.  Labs:  LDH trend: 326>400>462>422>387>388>368  INR trend: 1.1>1.5>1.4>1.5>3.0>2.4>2.2  WBC trend: 12.6>18>20.9>17.4>11.5>11.0>15.1  Anticoagulation Plan: -INR Goal: 2.0 - 2.5 -ASA Dose: 81mg    Blood Products:  IntraOp 11/14:  - 4 FFP  - DDAVP - 617 cell saver  PostOp 11/14: - 2 FFP  Device: -Medtronic -Therapies: OFF  Arrythmias: AFIB RVR 11/17  Respiratory: Extubated 07/06/22. 8L [place]  Renal:  -BUN:25>18>20>26>27>25>17 -CRT:1.28>1.23>1.44>1.46>1.47>1.27>1.1  Drips: Milrinone 0.375 mcg/kg/hr Amiodarone 30 mg/hr Epinephrine stopped 11/20 Lasix 10 mg/hr Heparin stopped 11/19  Pt Education: Will plan to continue dressing change/VAD education with caregivers this afternoon.   Plan/Recommendations:  1. Page VAD coordinator for equipment or drive line issues. 2. Daily drive line dressing changes per VAD  coordinator, nurse champion, or trained caregiver.  Bobbye Morton RN [place] Coordinator  Office: [phone]  24/7 Pager: [phone]

## 2022-07-11 NOTE — Progress Notes (Signed)
This chaplain is present for F/U spiritual care. The  Pt. is awake, hoping for a nap, and sharing his joy filled spirit.   The chaplain listened reflectively as the Pt. shared his plans for CIR and accepted the support of a setting to get stronger before returning home. The chaplain understands the Pt. family continues to be present for Pt. education. The Pt. shared his back up plan of recording education if needed. The Pt. remains reflective of God's place in his journey and his story. The chaplain shared a blessing with the Pt.  Chaplain Sallyanne Kuster [phone]

## 2022-07-12 ENCOUNTER — Inpatient Hospital Stay (HOSPITAL_COMMUNITY): Payer: Medicare Other

## 2022-07-12 DIAGNOSIS — R570 Cardiogenic shock: Secondary | ICD-10-CM | POA: Diagnosis not present

## 2022-07-12 DIAGNOSIS — I3139 Other pericardial effusion (noninflammatory): Secondary | ICD-10-CM

## 2022-07-12 DIAGNOSIS — Z95811 Presence of heart assist device: Secondary | ICD-10-CM

## 2022-07-12 DIAGNOSIS — I5023 Acute on chronic systolic (congestive) heart failure: Secondary | ICD-10-CM | POA: Diagnosis not present

## 2022-07-12 DIAGNOSIS — I509 Heart failure, unspecified: Secondary | ICD-10-CM | POA: Diagnosis not present

## 2022-07-12 DIAGNOSIS — I4891 Unspecified atrial fibrillation: Secondary | ICD-10-CM | POA: Diagnosis not present

## 2022-07-12 LAB — CULTURE, BLOOD (ROUTINE X 2)
Culture: NO GROWTH
Culture: NO GROWTH
Special Requests: ADEQUATE
Special Requests: ADEQUATE

## 2022-07-12 LAB — POCT I-STAT 7, (LYTES, BLD GAS, ICA,H+H)
Acid-Base Excess: 6 mmol/L — ABNORMAL HIGH (ref 0.0–2.0)
Bicarbonate: 28.7 mmol/L — ABNORMAL HIGH (ref 20.0–28.0)
Calcium, Ion: 1.13 mmol/L — ABNORMAL LOW (ref 1.15–1.40)
HCT: 27 % — ABNORMAL LOW (ref 39.0–52.0)
Hemoglobin: 9.2 g/dL — ABNORMAL LOW (ref 13.0–17.0)
O2 Saturation: 79 %
Patient temperature: 98.6
Potassium: 4 mmol/L (ref 3.5–5.1)
Sodium: 127 mmol/L — ABNORMAL LOW (ref 135–145)
TCO2: 30 mmol/L (ref 22–32)
pCO2 arterial: 34.2 mmHg (ref 32–48)
pH, Arterial: 7.533 — ABNORMAL HIGH (ref 7.35–7.45)
pO2, Arterial: 38 mmHg — CL (ref 83–108)

## 2022-07-12 LAB — BASIC METABOLIC PANEL
Anion gap: 13 (ref 5–15)
Anion gap: 13 (ref 5–15)
BUN: 32 mg/dL — ABNORMAL HIGH (ref 6–20)
BUN: 34 mg/dL — ABNORMAL HIGH (ref 6–20)
CO2: 29 mmol/L (ref 22–32)
CO2: 27 mmol/L (ref 22–32)
Calcium: 8.6 mg/dL — ABNORMAL LOW (ref 8.9–10.3)
Calcium: 8.4 mg/dL — ABNORMAL LOW (ref 8.9–10.3)
Chloride: 88 mmol/L — ABNORMAL LOW (ref 98–111)
Chloride: 86 mmol/L — ABNORMAL LOW (ref 98–111)
Creatinine, Ser: 1.21 mg/dL (ref 0.61–1.24)
Creatinine, Ser: 1.24 mg/dL (ref 0.61–1.24)
GFR, Estimated: >60 mL/min (ref >60)
GFR, Estimated: >60 mL/min (ref >60)
Glucose, Bld: 223 mg/dL — ABNORMAL HIGH (ref 70–99)
Glucose, Bld: 285 mg/dL — ABNORMAL HIGH (ref 70–99)
Potassium: 4.3 mmol/L (ref 3.5–5.1)
Potassium: 4 mmol/L (ref 3.5–5.1)
Sodium: 130 mmol/L — ABNORMAL LOW (ref 135–145)
Sodium: 126 mmol/L — ABNORMAL LOW (ref 135–145)

## 2022-07-12 LAB — CBC
HCT: 25.3 % — ABNORMAL LOW (ref 39.0–52.0)
HCT: 24.8 % — ABNORMAL LOW (ref 39.0–52.0)
Hemoglobin: 8.3 g/dL — ABNORMAL LOW (ref 13.0–17.0)
Hemoglobin: 8.6 g/dL — ABNORMAL LOW (ref 13.0–17.0)
MCH: 27.5 pg (ref 26.0–34.0)
MCH: 28.3 pg (ref 26.0–34.0)
MCHC: 32.8 g/dL (ref 30.0–36.0)
MCHC: 34.7 g/dL (ref 30.0–36.0)
MCV: 83.8 fL (ref 80.0–100.0)
MCV: 81.6 fL (ref 80.0–100.0)
Platelets: 371 10*3/uL (ref 150–400)
Platelets: 380 10*3/uL (ref 150–400)
RBC: 3.02 MIL/uL — ABNORMAL LOW (ref 4.22–5.81)
RBC: 3.04 MIL/uL — ABNORMAL LOW (ref 4.22–5.81)
RDW: 15 % (ref 11.5–15.5)
RDW: 15 % (ref 11.5–15.5)
WBC: 16.4 10*3/uL — ABNORMAL HIGH (ref 4.0–10.5)
WBC: 16.9 10*3/uL — ABNORMAL HIGH (ref 4.0–10.5)
nRBC: 0.7 % — ABNORMAL HIGH (ref 0.0–0.2)
nRBC: 0.5 % — ABNORMAL HIGH (ref 0.0–0.2)

## 2022-07-12 LAB — COOXEMETRY PANEL
Carboxyhemoglobin: 1.9 % — ABNORMAL HIGH (ref 0.5–1.5)
Carboxyhemoglobin: 2.2 % — ABNORMAL HIGH (ref 0.5–1.5)
Methemoglobin: <0.7 % (ref 0.0–1.5)
Methemoglobin: <0.7 % (ref 0.0–1.5)
O2 Saturation: 49.5 %
O2 Saturation: 69.9 %
Total hemoglobin: 8.6 g/dL — ABNORMAL LOW (ref 12.0–16.0)
Total hemoglobin: 8.7 g/dL — ABNORMAL LOW (ref 12.0–16.0)

## 2022-07-12 LAB — GLUCOSE, CAPILLARY
Glucose-Capillary: 193 mg/dL — ABNORMAL HIGH (ref 70–99)
Glucose-Capillary: 228 mg/dL — ABNORMAL HIGH (ref 70–99)
Glucose-Capillary: 229 mg/dL — ABNORMAL HIGH (ref 70–99)
Glucose-Capillary: 230 mg/dL — ABNORMAL HIGH (ref 70–99)
Glucose-Capillary: 238 mg/dL — ABNORMAL HIGH (ref 70–99)
Glucose-Capillary: 243 mg/dL — ABNORMAL HIGH (ref 70–99)
Glucose-Capillary: 283 mg/dL — ABNORMAL HIGH (ref 70–99)
Glucose-Capillary: 330 mg/dL — ABNORMAL HIGH (ref 70–99)

## 2022-07-12 LAB — MAGNESIUM: Magnesium: 2.1 mg/dL (ref 1.7–2.4)

## 2022-07-12 LAB — CULTURE, RESPIRATORY W GRAM STAIN

## 2022-07-12 LAB — ECHOCARDIOGRAM LIMITED
Height: 71 in
Weight: 3153.46 oz

## 2022-07-12 LAB — PROTIME-INR
INR: 2.2 — ABNORMAL HIGH (ref 0.8–1.2)
Prothrombin Time: 24.4 seconds — ABNORMAL HIGH (ref 11.4–15.2)

## 2022-07-12 LAB — LACTATE DEHYDROGENASE: LDH: 354 U/L — ABNORMAL HIGH (ref 98–192)

## 2022-07-12 MED ORDER — AMIODARONE HCL 200 MG PO TABS
200.0000 mg | ORAL_TABLET | Freq: Two times a day (BID) | ORAL | Status: DC
  Administered 2022-07-12 – 2022-07-21 (×19): 200 mg via ORAL
  Filled 2022-07-12 (×19): qty 1

## 2022-07-12 MED ORDER — CEFAZOLIN SODIUM-DEXTROSE 1-4 GM/50ML-% IV SOLN
1.0000 g | Freq: Three times a day (TID) | INTRAVENOUS | Status: DC
  Administered 2022-07-12 – 2022-07-13 (×3): 1 g via INTRAVENOUS
  Filled 2022-07-12 (×4): qty 50

## 2022-07-12 MED ORDER — WARFARIN SODIUM 2 MG PO TABS
2.0000 mg | ORAL_TABLET | Freq: Once | ORAL | Status: AC
  Administered 2022-07-12: 2 mg via ORAL
  Filled 2022-07-12: qty 1

## 2022-07-12 MED ORDER — INSULIN GLARGINE-YFGN 100 UNIT/ML ~~LOC~~ SOLN
25.0000 [IU] | Freq: Two times a day (BID) | SUBCUTANEOUS | Status: DC
  Administered 2022-07-12 – 2022-07-13 (×3): 25 [IU] via SUBCUTANEOUS
  Filled 2022-07-12 (×5): qty 0.25

## 2022-07-12 MED ORDER — MILRINONE LACTATE IN DEXTROSE 20-5 MG/100ML-% IV SOLN: 0.1250 ug/kg/min | INTRAVENOUS | Status: DC

## 2022-07-12 NOTE — Progress Notes (Addendum)
NAME:  Darryl Frye, MRN:  [id number], DOB:  07/13/1961, LOS: 63 ADMISSION DATE:  06/27/2022, CONSULTATION DATE:  11/14 REFERRING MD:  Aundra Dubin, CHIEF COMPLAINT:  critical care support s/p LVAD   History of Present Illness:  61 year old male w/ sig h/o NICM (EF 20-25%), af/flutter. Presented 11/14 for LVAD after failing out-pt medical therapies. Initial flow 4.5 l/min speed 5100 power 3.6 P1 2.3  Pertinent  Medical History  Afib/flutter s/p ablation May 2023 NICM EF 20-25% has ICD Remote h/o heavy ETOH and cocaine Class IV NYHF sxs.   Significant Hospital Events: Including procedures, antibiotic start and stop dates in addition to other pertinent events   11/14 LVAD placed. Returned to ICU full vent support. On iNO 20ppm, Returned to ICU post-op intubated on Neo, epi 6, and amio gtt. Initial hemodynamics PA 34/20 CO 4.9 CI 2.4 co ox 70% l CVP 10-. On full vent support. PCCM asked to assist w/ care  11/15 iNO weaned off. Got lasix. Pump speed increased to 5200. Still on epi and milrione. Awake following commands 11/15 attempted SBT but saturations dropped 11/16 extubated  11/17 mobilizations, remains on pressors   Interim History / Subjective:  Stated could not sleep overnight Started bleeding from left nostril Remains on Amio, milrinone and Lasix infusion Diuresed well with net -1 L and weight is down 3 pounds  Objective   Blood pressure 95/79, pulse 92, temperature 98.4 F (36.9 C), temperature source Oral, resp. rate (!) 25, height 5\' 11"  (1.803 m), weight 89.4 kg, SpO2 95 %. CVP:  [0 mmHg-14 mmHg] 7 mmHg  FiO2 (%):  [55 %-100 %] 55 %   Intake/Output Summary (Last 24 hours) at 07/12/2022 1004 Last data filed at 07/12/2022 1000 Gross per 24 hour  Intake 2323.55 ml  Output 3535 ml  Net -1211.45 ml   Filed Weights   07/10/22 0500 07/11/22 0500 07/12/22 0500  Weight: 93.1 kg 90.9 kg 89.4 kg   Examination: General: Middle-age male, lying in the bed on Ventimask HENT NCAT,  tracking appropriately Heart: Hum from LVAD, midline sternal incision Pulm: Bilateral basal crackles, no rhonchi or wheezes Abd: Soft nontender nondistended Ext: Warm dry no edema Neuro: Alert, awake, following commands Skin: No rash  Assessment & Plan:  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 s/p LVAD HM3 Continue milrinone per heart failure service Continue sildenafil and digoxin Remains on amiodarone and Lasix infusions Weight is down 3 pounds Coox is 70%  Acute Hypoxemic Respiratory Failure  Left nare epistaxis Patient started bleeding from left nostril overnight, likely due to dryness from high flow nasal cannula oxygen His INR is therapeutic at 2.2 ENT consulted, he has nasal packing, which will be removed by Monday Currently on Ventimask Patient completed antibiotic therapy for pneumonia yesterday He does not look clinically infected, diuresing well Though x-ray chest continue to show bilateral infiltrates If it does not improve, we may need to do bronchoscopy Titrate with O2 sat goal 92% Continue PT/OT  Continue incentive spirometry and flutter valve  H/o afib/flutter s/p prior ablation Was previously on DOAC, currently in NSR On Amiodarone infusion and  digoxin Heart rate control per cardiology Continue Coumadin, INR is 2.2  H/o DVT Continue Coumadin, INR is therapeutic Appreciate pharmacy to see help.  Hypervolemic hyponatremia Serum sodium remained at 130 Closely monitor  Hypokalemia/hypophosphatemia/hypocalcemia, resolved  Acute kidney injury likely due to cardiorenal syndrome Serum creatinine is slowly trending up, likely in the setting of aggressive diuresis Monitor intake  and output Avoid nephrotoxic agents  Best Practice (right click and "Reselect all SmartList Selections" daily)   Diet/type: 2 g sodium diet DVT prophylaxis: Coumadin GI prophylaxis: Protonix Lines: Central line Foley:  Yes, and it is still needed Code  Status:  limited Last date of multidisciplinary goals of care discussion [Per primary team]   Total critical care time: 32 minutes  Performed by: [place] care time was exclusive of separately billable procedures and treating other patients.   Critical care was necessary to treat or prevent imminent or life-threatening deterioration.   Critical care was time spent personally by me on the following activities: development of treatment plan with patient and/or surrogate as well as nursing, discussions with consultants, evaluation of patient's response to treatment, examination of patient, obtaining history from patient or surrogate, ordering and performing treatments and interventions, ordering and review of laboratory studies, ordering and review of radiographic studies, pulse oximetry and re-evaluation of patient's condition.   Jacky Kindle, MD [facility] See Amion for pager If no response to pager, please call [phone] until 7pm After 7pm, Please call E-link [phone]

## 2022-07-12 NOTE — Progress Notes (Addendum)
Patient ID: Darryl Frye, male   DOB: 10-05-1960, 61 y.o.   MRN: [id number] Advanced Heart Failure VAD Team Note  PCP-Cardiologist: Carlyle Dolly, MD   Subjective:    - 11/13 S/P HMIII L atrial appendage closure.  - 11/16 Extubated . GIven 40 mg IV and started on lasix drip 10 mg per/hr.  - 11/17 Developed A fib RVR. Given bolus 150 x 2. Unchanged.  - 11/18 Bedside echo 11/18 LV small. VAD cannula well aligned. AoV opening every beat. Septum pulling to left RV moderately HK. Small to moderate anterior effusion  Milrinone 0.25 mcg + amio 30 mg + lasix drip at 10 mg. I/Os net negative around 1 L, weight down 3 lbs which is close to pre-op weight. CVP 7 this morning.  Severe nosebleed last night, had co-ox of 50% but was not wearing oxygen at the time because of nosebleed and oxygen saturation was low. MAP 80s-90s  Remains in NSR. INR 2.2 on warfarin  CXR still has bilateral infiltrates, not much improvement with diuresis.  He stopped empiric abx yesterday.  Still requiring 8L [place], currently on facemask due to nosebleed.   Ongoing epistaxis since 8 pm last night, severe per nursing.  Hgb 8.3 today.   He did well on his walk yesterday.   LVAD INTERROGATION:  HeartMate III LVAD:   Flow 4.4 liters/min, speed 5300, power 4 , PI 3.8.  LDH 368 => 354  Objective:    Vital Signs:   Temp:  [98.6 F (37 C)-99.8 F (37.7 C)] 98.9 F (37.2 C) (11/21 1701) Pulse Rate:  [91-106] 106 (11/22 0700) Resp:  [22-32] 24 (11/22 0700) BP: (92-147)/(77-133) 107/88 (11/22 0700) SpO2:  [80 %-100 %] 97 % (11/22 0700) Arterial Line BP: (92-110)/(60-77) 105/73 (11/21 1600) Weight:  [89.4 kg] 89.4 kg (11/22 0500) Last BM Date : 07/10/22 Mean arterial Pressure 80s   Intake/Output:   Intake/Output Summary (Last 24 hours) at 07/12/2022 0739 Last data filed at 07/12/2022 0600 Gross per 24 hour  Intake 2950.21 ml  Output 3865 ml  Net -914.79 ml     Physical Exam  CVP 7 General: Well appearing this  am. NAD.  HEENT: Epistaxis Neck: Supple, JVP 7-8 cm. Carotids OK.  Cardiac:  Mechanical heart sounds with LVAD hum present.  Lungs:  Crackles at bases.  Abdomen:  NT, ND, no HSM. No bruits or masses. +BS  LVAD exit site: Well-healed and incorporated. Dressing dry and intact. No erythema or drainage. Stabilization device present and accurately applied. Driveline dressing changed daily per sterile technique. Extremities:  Warm and dry. No cyanosis, clubbing, rash, or edema.  Neuro:  Alert & oriented x 3. Cranial nerves grossly intact. Moves all 4 extremities w/o difficulty. Affect pleasant     Telemetry  NSR 90s   Labs   Basic Metabolic Panel: Recent Labs  Lab 07/07/22 0354 07/07/22 0510 07/08/22 0408 07/08/22 0421 07/09/22 0259 07/09/22 0558 07/09/22 2012 07/10/22 0200 07/10/22 0355 07/10/22 7035 07/10/22 1404 07/11/22 0426 07/11/22 0617 07/12/22 0435 07/12/22 0533  NA 137   < > 136   < > 136   < > 134*   < > 135   < > 134* 134* 129* 130* 127*  K 3.8   < > 3.0*   < > 3.0*   < > 3.5   < > 3.7   < > 3.8 3.9 4.1 4.3 4.0  CL 102   < > 97*   < > 96*   < >  99  --  97*  --  92* 91*  --  88*  --   CO2 21*   < > 23   < > 26   < > 26  --  27  --  26 28  --  29  --   GLUCOSE 206*   < > 151*   < > 126*   < > 86  --  146*  --  187* 135*  --  223*  --   BUN 28*   < > 25*   < > 25*   < > 23*  --  21*  --  19 17  --  32*  --   CREATININE 1.37*   < > 1.27*   < > 1.18   < > 1.05  --  1.00  --  1.10 1.07  --  1.21  --   CALCIUM 8.4*   < > 8.4*   < > 8.2*   < > 8.2*  --  8.5*  --  8.7* 8.7*  --  8.6*  --   MG 2.3   < > 2.0  --  1.9  --   --   --  1.8  --   --  1.8  --  2.1  --   PHOS 3.5  --  3.3  --  2.1*  --   --   --  1.5*  --   --  2.8  --   --   --    < > = values in this interval not displayed.    Liver Function Tests: Recent Labs  Lab 07/06/22 0414 07/07/22 0354  AST 102* 57*  ALT 27 21  ALKPHOS 74 89  BILITOT 0.6 0.7  PROT 6.4* 6.6  ALBUMIN 3.1* 2.9*   No results for  input(s): "LIPASE", "AMYLASE" in the last 168 hours. No results for input(s): "AMMONIA" in the last 168 hours.  CBC: Recent Labs  Lab 07/07/22 0354 07/07/22 0510 07/08/22 0408 07/08/22 0421 07/09/22 0259 07/09/22 6644 07/10/22 0355 07/10/22 0347 07/11/22 0426 07/11/22 0617 07/12/22 0435 07/12/22 0533  WBC 20.9*  --  17.4*  --  11.5*  --  11.0*  --  15.1*  --  16.4*  --   NEUTROABS 18.6*  --  15.2*  --  9.3*  --  8.4*  --  11.9*  --   --   --   HGB 11.4*   < > 10.3*   < > 9.4*   < > 9.4* 9.5* 9.3* 10.2* 8.3* 9.2*  HCT 33.1*   < > 29.6*   < > 26.2*   < > 28.0* 28.0* 27.2* 30.0* 25.3* 27.0*  MCV 83.6  --  82.0  --  80.9  --  82.1  --  80.5  --  83.8  --   PLT 222  --  220  --  211  --  271  --  306  --  371  --    < > = values in this interval not displayed.    INR: Recent Labs  Lab 07/08/22 0900 07/09/22 0259 07/10/22 0355 07/11/22 0426 07/12/22 0435  INR 1.5* 3.0* 2.4* 2.2* 2.2*  :    Imaging   No results found.   Medications:     Scheduled Medications:  sodium chloride   Intravenous Once   aspirin  81 mg Oral Daily   atorvastatin  20 mg Oral Daily   bisacodyl  10 mg  Oral Daily   Or   bisacodyl  10 mg Rectal Daily   Chlorhexidine Gluconate Cloth  6 each Topical Daily   digoxin  0.125 mg Oral Daily   feeding supplement  237 mL Oral TID BM   insulin aspart  0-24 Units Subcutaneous Q4H   insulin glargine-yfgn  20 Units Subcutaneous BID   ipratropium-albuterol  3 mL Nebulization Q6H   levothyroxine  25 mcg Oral Q0600   lidocaine  2 patch Transdermal Q24H   magnesium oxide  400 mg Oral BID   melatonin  3 mg Oral QHS   multivitamin with minerals  1 tablet Oral Daily   pantoprazole  40 mg Oral Daily   polyethylene glycol  17 g Oral BID   senna-docusate  1 tablet Oral BID   sertraline  50 mg Oral Daily   sildenafil  20 mg Oral TID   sodium chloride flush  10-40 mL Intracatheter Q12H   sodium chloride flush  10-40 mL Intracatheter Q12H   sodium chloride  flush  3 mL Intravenous Q12H   sodium chloride flush  3 mL Intravenous Q12H   spironolactone  25 mg Oral Daily   Warfarin - Physician Dosing Inpatient   Does not apply q1600    Infusions:  sodium chloride Stopped (07/07/22 1346)   sodium chloride     sodium chloride     amiodarone 30 mg/hr (07/12/22 0600)   furosemide (LASIX) 200 mg in dextrose 5 % 100 mL (2 mg/mL) infusion 10 mg/hr (07/12/22 0600)   lactated ringers     lactated ringers Stopped (07/04/22 2207)   lactated ringers 20 mL/hr at 07/12/22 0600   milrinone 0.25 mcg/kg/min (07/12/22 0600)    PRN Medications: sodium chloride, acetaminophen, ALPRAZolam, dextrose, hydrALAZINE, lactated ringers, morphine injection, ondansetron (ZOFRAN) IV, ondansetron (ZOFRAN) IV, mouth rinse, oxyCODONE, oxymetazoline, sodium chloride flush, sodium chloride flush, sodium chloride flush, traMADol, tranexamic acid   Patient Profile   Admitted with A/C HFrEF --> cardiogenic shock. S/P HMIII LVAD  Assessment/Plan:    1. Acute on chronic systolic CHF/cardiogenic shock: Nonischemic cardiomyopathy, diagnosed 2020.  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  Cath in 12/20 with no significant coronary disease.  Medtronic ICD. Most recent echo in 2/23 showed EF 20-25% with normal RV.  Recently, patient has been symptomatically worse, NYHA class IV with profound orthopnea. RHC showed markedly elevated filling pressures, primarily pulmonary venous hypertension, low cardiac output, and low PAPI.  Patient was admitted for inotrope and diuresis. Echo this admission with EF <20%, mild RV dysfunction.  He was started on milrinone with improvement in hemodynamics.  We proceeded with HM3 LVAD with LAA clip on 11/14.  He is currently on milrinone 0.25 and sildenafil 20 tid.  Co-ox 50% this  morning but was off oxygen and hypoxemic when drawn.  CVP 7 with good diuresis, near pre-op weight. Creatinine mildly higher at 1.2.  - Continue milrinone 0.25, resend co-ox now that he is back on oxygen with stable saturation.  - Continue sildenafil 20 tid.  - Decrease Lasix down to 8 mg/hr, keep even to mildly negative.  - Continue 25 mg spiro daily.  - Continue digoxin 0.125 daily.  2. A fib RVR : H/O S/p flutter ablation.  Back in NSR.  - Continue IV amiodarone 30 mg/hr while on milrinone.  -  Continue warfarin.  3. VAD management: s/p HM-3 VAD on 11/14.  Bedside echo 11/18 LV small, VAD cannula well aligned, AoV opening every beat, septum pulling to left RV moderately HK, small to moderate anterior effusion. LDH 354. - Now on warfarin with INR 2.2  - ASA 81. - VAD interrogated personally. Parameters stable. - Will do limited echo today.  4. H/o DVT: On anticoagulation.  5. Acute hypoxemic respiratory failure, post-op. 11/16 extubated.  Bilateral lower lobe infiltrates on CXR. CVP trending down but CXR has not improved, still with bilateral infiltrates and significant oxygen requirement.  He has been on amiodarone this admission for atrial fibrillation, was not on amiodarone before this hospitalization.  Will need to think about amiodarone lung toxicity, discussed with Dr Tacy Learn who does not think particularly likely but will need to monitor for improvement.  It is also possible that he aspirated.   - He has completed course of vancomycin/cefepime, now afebrile.  - CVP down to 7, will continue lower Lasix dose at 8 mg/hr.  - Incentive spirometry.  - Encourage mobility.  - CT chest w/o contrast, discussed with Dr. Tenny Craw.  - CCM following.  6. AKI: Creatinine mildly higher today at 1.2.  - Decreasing Lasix with CVP down to 7.  7. ID: Afebrile, WBCs 16.  He has completed empiric abx for ?post-op PNA.  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  9. Hypokalemia - Continue spironolactone.  10.  Epistaxis: Intractable overnight.  - Will involve ENT to get rhinorocket.  11. Anemia: Hgb lower today at 8.3 in setting of severe epistaxis.  - Transfuse hgb < 7.5 at this point.  10. Deconditioning - PT/OT following  - CIR  CRITICAL CARE Performed by: Loralie Champagne  Total critical care time: 45 minutes  Critical care time was exclusive of separately billable procedures and treating other patients.  Critical care was necessary to treat or prevent imminent or life-threatening deterioration.  Critical care was time spent personally by me on the following activities: development of treatment plan with patient and/or surrogate as well as nursing, discussions with consultants, evaluation of patient's response to treatment, examination of patient, obtaining history from patient or surrogate, ordering and performing treatments and interventions, ordering and review of laboratory studies, ordering and review of radiographic studies, pulse oximetry and re-evaluation of patient's condition.  Loralie Champagne 07/12/2022 7:39 AM

## 2022-07-12 NOTE — Progress Notes (Signed)
TCTS Progress Note:  TCTS Progress Note: POD 8 VAD  RAMp test today and speed increased to 5400.   DC chest tube, last one. DC introducer in neck  Has PICC line  CT chest today for continuing diffuse infiltrates. I suspect patient may have aspirated at some point, not clear.   No pacing wires to dc   HF management of daily meds otherwise   Pleased with his progress thusfar.      Latest Ref Rng & Units 07/12/2022    8:10 AM 07/12/2022    5:33 AM 07/12/2022    4:35 AM  CBC  WBC 4.0 - 10.5 K/uL 16.9   16.4   Hemoglobin 13.0 - 17.0 g/dL 8.6  9.2  8.3   Hematocrit 39.0 - 52.0 % 24.8  27.0  25.3   Platelets 150 - 400 K/uL 380   371        Latest Ref Rng & Units 07/12/2022    5:33 AM 07/12/2022    4:35 AM 07/11/2022    6:17 AM  CMP  Glucose 70 - 99 mg/dL  223    BUN 6 - 20 mg/dL  32    Creatinine 0.61 - 1.24 mg/dL  1.21    Sodium 135 - 145 mmol/L 127  130  129   Potassium 3.5 - 5.1 mmol/L 4.0  4.3  4.1   Chloride 98 - 111 mmol/L  88    CO2 22 - 32 mmol/L  29    Calcium 8.9 - 10.3 mg/dL  8.6      ABG    Component Value Date/Time   PHART 7.533 (H) 07/12/2022 0533   PCO2ART 34.2 07/12/2022 0533   PO2ART 38 (LL) 07/12/2022 0533   HCO3 28.7 (H) 07/12/2022 0533   TCO2 30 07/12/2022 0533   ACIDBASEDEF 2.0 07/07/2022 0510   O2SAT 69.9 07/12/2022 0740    FiO2 (%):  [55 %-100 %] 55 %  N: no issues CV VAD speed 5400, RAMP test on 11/22 Resp: 02, facemask due to some nasal bleeding. CT chest today to better characterize diffuse infiltrates.  GI: cont diet, stool softeners GU: lasix gtt per HF Heme: 2mg  coumadin tonight, INR 2.2 today. Wouldn't overdose as already has clinical bleeding (nasal) and moderate pericardial effusion by echo.  ID: vanc/cefepime doses are complete today. Daily monitoring if needs continued abx.  Endo: no issues T/L/D: neck line and last chest tube coming out today  Dispo: ICU over the holiday given 02 requirements Likely one more week on  floor and then home.

## 2022-07-12 NOTE — Progress Notes (Addendum)
LVAD Coordinator Rounding Note:  HM 3 LVAD implanted on 07/04/22 by Dr Tenny Craw under destination criteria due to uncontrolled diabetes. Left atrial appendage clipped at time of surgery.  Pt is sitting in the chair on my arrival. States he had a rough night due to a continuous nose bleed. ENT consulted this morning and packed his left nare. Packing to be removed on Monday.   Bedside nurse reports that pt kept taking oxygen off due to ongoing nosebleed. Coox 49.5 this morning. Patient placed on venturi mask and repeat Coox 69.9. Milrinone decreased to .150mg/kg/hr per Dr. MAundra Dubin  Went into a-fib over the weekend. Rate 130s-150s. Has received 4 Amiodarone boluses. Plan to transition to p.o. Amiodarone today.  Bedside echo done this morning. Speed increase to 5400 per Dr MAundra Dubin  Met with JJanett Billowat bedside today to provide ongoing education on sterile dressing change. Started education on controller, batteries, clip, and power source change. Both JJanett Billowand TOctavia Brucknerdemonstrated power source change without difficulty.   Vital signs: Temp: 98.6 HR: 97 NSR Doppler Pressure:90 Arterial BP: 95/62 (71) O2 Sat: 94% on 8L Wt: 214.3>208.8>209.2>207.2>206.6>205.3>200>197.1 lb  LVAD interrogation reveals:  Speed: 5350 Flow: 4.5 Power: 3.8 w PI: 3.4   Alarms: none Events: rare Hematocrit: 27  Fixed speed: 5300 Low speed limit: 5000   Drive Line:  Dressing change performed by caregiver JJanett Billowwith VAD Coordinator guidance. Existing VAD dressing removed and site care performed using sterile technique. Drive line exit site cleaned with Chlora prep applicators x 2, allowed to dry, and gauze dressing with silver strip applied. Exit site unincorporated, the velour is fully implanted at exit site. 1 blue sutures, and 1 black suture in place. Slight bruising at exit site. Small amount of serosanguinous drainage on previous dressing/silver strip. No redness, tenderness, or foul odor. Slight rash noted  around edges of dressing. Opted to not use skin prep today in case this was the cause of irritation. Drive line anchor re-applied. Next dressing change 07/14/22 to be performed by nurse champion.  Labs:  LDH trend: 326>400>462>422>387>388>368>354  INR trend: 1.1>1.5>1.4>1.5>3.0>2.4>2.2>2.2  WBC trend: 12.6>18>20.9>17.4>11.5>11.0>15.1>16.4  Anticoagulation Plan: -INR Goal: 2.0 - 2.5 -ASA Dose: 863m  Blood Products:  IntraOp 11/14:  - 4 FFP  - DDAVP - 617 cell saver  PostOp 11/14: - 2 FFP  Device: -Medtronic -Therapies: OFF  Arrythmias: AFIB RVR 11/17  Respiratory: Extubated 07/06/22. 8L [place]  Renal:  -BUN:25>18>20>26>27>25>17 -CRT:1.28>1.23>1.44>1.46>1.47>1.27>1.1  Drips: Milrinone 0.125 mcg/kg/hr Amiodarone 30 mg/hr- transitioned to p.o. 07/12/22 Epinephrine stopped 11/20 Lasix 8 mg/hr Heparin stopped 11/19  Pt Education: Will plan to continue dressing change/VAD education with caregivers this afternoon.   Plan/Recommendations:  1. Page VAD coordinator for equipment or drive line issues. 2. Advance to every other day drive line dressing changes per VAD coordinator, nurse champion, or trained caregiver.  OlBobbye MortonN VAGorevilleoordinator  Office: [phone] Pager: [phone]

## 2022-07-12 NOTE — Progress Notes (Signed)
   Developed epistaxis this morning.    Consulted ENT per Dr Aundra Dubin request.   Discussed ENT, Dr Constance Holster. Consult requested.   Cyanne Delmar NP_C  7:47 AM]

## 2022-07-12 NOTE — Progress Notes (Signed)
  Echocardiogram 2D Echocardiogram has been performed.  Darryl Frye 07/12/2022, 10:56 AM

## 2022-07-12 NOTE — Progress Notes (Signed)
Speed  Flow  PI  Power  LVIDD  AI  Aortic opening MR  TR  Septum  RV  VTI (>18cm)  5300  4.3 3.3 4   3/5      mod    5400  4.7 3.4 4.2   2/5    mod    Auto cuff BP: 91/80 (86)   Ramp ECHO performed at bedside per Dr Aundra Dubin  At completion of ramp study, patients primary controller programmed: 5400 Fixed speed:5400 Low speed limit:5100  Bobbye Morton RN, VAD Coordinator 24/7 pager [phone]

## 2022-07-12 NOTE — Progress Notes (Signed)
Arterial blood gas obtained on room air. Patient placed on 100% NRB after arterial results.

## 2022-07-12 NOTE — Care Management (Signed)
1136 07-12-22 Late Entry: Case Manager received notification from Case Management office CMA: Rosalyn with [facility] Medicare called stating that the patient will need a P2P for CIR due today by 3:30 pm. Please call [phone] option [phone]. Please provide DOB and ID# [id number].

## 2022-07-12 NOTE — Consult Note (Signed)
Reason for Consult: Epistaxis Referring Physician: Enter, Pierre Bali, MD  Darryl Frye is an 61 y.o. male.  HPI: Patient on [place]-term anticoagulation.  Has been bleeding from the left side of the nose for couple of days, yesterday was the worst.  Past Medical History:  Diagnosis Date   Acute kidney injury ([place])    Acute respiratory failure ([place])    Atrial flutter ([place])    on Eliquis   CHF (congestive heart failure) ([place])    EF 20-25% 10/15/21, AICD in place.   Coronary artery disease    Diabetes mellitus without complication (HCC)    DVT (deep venous thrombosis) ([place]) 06/2019   Heart murmur    Hypertension    Presence of permanent cardiac pacemaker    Renal disorder     Past Surgical History:  Procedure Laterality Date   A-FLUTTER ABLATION N/A 01/12/2022   Procedure: A-FLUTTER ABLATION;  Surgeon: Evans Lance, MD;  Location: [facility];  Service: Cardiovascular;  Laterality: N/A;   COLONOSCOPY     Dr. Posey Pronto; One 4-6 mm hyperplastic rectal polyp, mid left-sided diverticulosis.   CYST REMOVAL NECK     HERNIA REPAIR     INSERTION OF IMPLANTABLE LEFT VENTRICULAR ASSIST DEVICE N/A 07/04/2022   Procedure: INSERTION OF IMPLANTABLE LEFT VENTRICULAR ASSIST DEVICE; ATRICLIP 39;  Surgeon: Neomia Glass, MD;  Location: [facility];  Service: Open Heart Surgery;  Laterality: N/A;   RIGHT HEART CATH N/A 06/27/2022   Procedure: RIGHT HEART CATH;  Surgeon: Larey Dresser, MD;  Location: [facility];  Service: Cardiovascular;  Laterality: N/A;   RIGHT HEART CATH N/A 06/29/2022   Procedure: RIGHT HEART CATH;  Surgeon: Larey Dresser, MD;  Location: [facility];  Service: Cardiovascular;  Laterality: N/A;   TEE WITHOUT CARDIOVERSION N/A 07/04/2022   Procedure: TRANSESOPHAGEAL ECHOCARDIOGRAM (TEE);  Surgeon: Neomia Glass, MD;  Location: [facility];  Service: Open Heart Surgery;  Laterality: N/A;   TONSILLECTOMY      Family History  Problem Relation Age of Onset    Hyperlipidemia Mother    Heart disease Mother        has PPM   Diabetes Mother    Hypertension Mother    Heart disease Maternal Grandmother    Prostate cancer Maternal Grandfather    Colon cancer Neg Hx    Colon polyps Neg Hx     Social History:  reports that he quit smoking about 3 years ago. His smoking use included cigarettes. He has a 20.00 pack-year smoking history. He has never used smokeless tobacco. He reports that he does not currently use alcohol. He reports that he does not currently use drugs after having used the following drugs: "Crack" cocaine.  Allergies: No Known Allergies  Medications: Reviewed  Results for orders placed or performed during the hospital encounter of 06/27/22 (from the past 48 hour(s))  Glucose, capillary     Status: Abnormal   Collection Time: 07/10/22 11:48 AM  Result Value Ref Range   Glucose-Capillary 190 (H) 70 - 99 mg/dL    Comment: Glucose reference range applies only to samples taken after fasting for at least 8 hours.  Glucose, capillary     Status: Abnormal   Collection Time: 07/10/22 11:57 AM  Result Value Ref Range   Glucose-Capillary 275 (H) 70 - 99 mg/dL    Comment: Glucose reference range applies only to samples taken after fasting for at least 8 hours.  Basic metabolic panel  Status: Abnormal   Collection Time: 07/10/22  2:04 PM  Result Value Ref Range   Sodium 134 (L) 135 - 145 mmol/L   Potassium 3.8 3.5 - 5.1 mmol/L   Chloride 92 (L) 98 - 111 mmol/L   CO2 26 22 - 32 mmol/L   Glucose, Bld 187 (H) 70 - 99 mg/dL    Comment: Glucose reference range applies only to samples taken after fasting for at least 8 hours.   BUN 19 6 - 20 mg/dL   Creatinine, Ser 1.10 0.61 - 1.24 mg/dL   Calcium 8.7 (L) 8.9 - 10.3 mg/dL   GFR, Estimated >60 >60 mL/min    Comment: (NOTE) Calculated using the CKD-EPI Creatinine Equation (2021)    Anion gap 16 (H) 5 - 15    Comment: Performed at [facility] [address], [place], [place]  [address]  Glucose, capillary     Status: Abnormal   Collection Time: 07/10/22  4:25 PM  Result Value Ref Range   Glucose-Capillary 191 (H) 70 - 99 mg/dL    Comment: Glucose reference range applies only to samples taken after fasting for at least 8 hours.  Glucose, capillary     Status: Abnormal   Collection Time: 07/10/22  8:24 PM  Result Value Ref Range   Glucose-Capillary 224 (H) 70 - 99 mg/dL    Comment: Glucose reference range applies only to samples taken after fasting for at least 8 hours.  Glucose, capillary     Status: Abnormal   Collection Time: 07/10/22 11:34 PM  Result Value Ref Range   Glucose-Capillary 223 (H) 70 - 99 mg/dL    Comment: Glucose reference range applies only to samples taken after fasting for at least 8 hours.  Brain natriuretic peptide     Status: Abnormal   Collection Time: 07/10/22 11:49 PM  Result Value Ref Range   B Natriuretic Peptide 679.3 (H) 0.0 - 100.0 pg/mL    Comment: Performed at [facility] [address], [place], [place] [address]  Glucose, capillary     Status: Abnormal   Collection Time: 07/11/22  4:23 AM  Result Value Ref Range   Glucose-Capillary 123 (H) 70 - 99 mg/dL    Comment: Glucose reference range applies only to samples taken after fasting for at least 8 hours.  CBC with Differential     Status: Abnormal   Collection Time: 07/11/22  4:26 AM  Result Value Ref Range   WBC 15.1 (H) 4.0 - 10.5 K/uL   RBC 3.38 (L) 4.22 - 5.81 MIL/uL   Hemoglobin 9.3 (L) 13.0 - 17.0 g/dL   HCT 27.2 (L) 39.0 - 52.0 %   MCV 80.5 80.0 - 100.0 fL   MCH 27.5 26.0 - 34.0 pg   MCHC 34.2 30.0 - 36.0 g/dL   RDW 14.6 11.5 - 15.5 %   Platelets 306 150 - 400 K/uL   nRBC 1.3 (H) 0.0 - 0.2 %   Neutrophils Relative % 78 %   Neutro Abs 11.9 (H) 1.7 - 7.7 K/uL   Lymphocytes Relative 6 %   Lymphs Abs 0.8 0.7 - 4.0 K/uL   Monocytes Relative 10 %   Monocytes Absolute 1.6 (H) 0.1 - 1.0 K/uL   Eosinophils Relative 4 %   Eosinophils Absolute 0.5 0.0 - 0.5  K/uL   Basophils Relative 0 %   Basophils Absolute 0.1 0.0 - 0.1 K/uL   Immature Granulocytes 2 %   Abs Immature Granulocytes 0.26 (H)  0.00 - 0.07 K/uL    Comment: Performed at [facility], [place] [address], [place], [place] [address]  Magnesium     Status: None   Collection Time: 07/11/22  4:26 AM  Result Value Ref Range   Magnesium 1.8 1.7 - 2.4 mg/dL    Comment: Performed at [facility] [address], [place], [place] [address]  Phosphorus     Status: None   Collection Time: 07/11/22  4:26 AM  Result Value Ref Range   Phosphorus 2.8 2.5 - 4.6 mg/dL    Comment: Performed at [facility] [address], [place], [place] [address]  Lactate dehydrogenase     Status: Abnormal   Collection Time: 07/11/22  4:26 AM  Result Value Ref Range   LDH 368 (H) 98 - 192 U/L    Comment: Performed at [facility], [place] [address], [place], [place] [address]  Basic metabolic panel     Status: Abnormal   Collection Time: 07/11/22  4:26 AM  Result Value Ref Range   Sodium 134 (L) 135 - 145 mmol/L   Potassium 3.9 3.5 - 5.1 mmol/L   Chloride 91 (L) 98 - 111 mmol/L   CO2 28 22 - 32 mmol/L   Glucose, Bld 135 (H) 70 - 99 mg/dL    Comment: Glucose reference range applies only to samples taken after fasting for at least 8 hours.   BUN 17 6 - 20 mg/dL   Creatinine, Ser 1.07 0.61 - 1.24 mg/dL   Calcium 8.7 (L) 8.9 - 10.3 mg/dL   GFR, Estimated >60 >60 mL/min    Comment: (NOTE) Calculated using the CKD-EPI Creatinine Equation (2021)    Anion gap 15 5 - 15    Comment: Performed at [facility] [address] [place], [place] [address]  Protime-INR     Status: Abnormal   Collection Time: 07/11/22  4:26 AM  Result Value Ref Range   Prothrombin Time 23.8 (H) 11.4 - 15.2 seconds   INR 2.2 (H) 0.8 - 1.2    Comment: (NOTE) INR goal varies based on device and disease states. Performed at [facility], [place] [address], [place], [place] [address]   Cooxemetry Panel  (carboxy, met, total hgb, O2 sat)     Status: Abnormal   Collection Time: 07/11/22  6:17 AM  Result Value Ref Range   Total hemoglobin 11.1 (L) 12.0 - 16.0 g/dL   O2 Saturation 57.2 %   Carboxyhemoglobin 1.6 (H) 0.5 - 1.5 %   Methemoglobin <0.7 0.0 - 1.5 %    Comment: Performed at [facility] [address], [place], [place] [address]  I-STAT 7, (LYTES, BLD GAS, ICA, H+H)     Status: Abnormal   Collection Time: 07/11/22  6:17 AM  Result Value Ref Range   pH, Arterial 7.573 (H) 7.35 - 7.45   pCO2 arterial 37.8 32 - 48 mmHg   pO2, Arterial 71 (L) 83 - 108 mmHg   Bicarbonate 34.7 (H) 20.0 - 28.0 mmol/L   TCO2 36 (H) 22 - 32 mmol/L   O2 Saturation 96 %   Acid-Base Excess 12.0 (H) 0.0 - 2.0 mmol/L   Sodium 129 (L) 135 - 145 mmol/L   Potassium 4.1 3.5 - 5.1 mmol/L   Calcium, Ion 1.11 (L) 1.15 - 1.40 mmol/L   HCT 30.0 (L) 39.0 - 52.0 %   Hemoglobin 10.2 (L) 13.0 - 17.0 g/dL  Patient temperature 37.4 C    Sample type ARTERIAL   Glucose, capillary     Status: Abnormal   Collection Time: 07/11/22  9:22 AM  Result Value Ref Range   Glucose-Capillary 280 (H) 70 - 99 mg/dL    Comment: Glucose reference range applies only to samples taken after fasting for at least 8 hours.  Glucose, capillary     Status: Abnormal   Collection Time: 07/11/22 12:17 PM  Result Value Ref Range   Glucose-Capillary 197 (H) 70 - 99 mg/dL    Comment: Glucose reference range applies only to samples taken after fasting for at least 8 hours.  Glucose, capillary     Status: Abnormal   Collection Time: 07/11/22  4:58 PM  Result Value Ref Range   Glucose-Capillary 256 (H) 70 - 99 mg/dL    Comment: Glucose reference range applies only to samples taken after fasting for at least 8 hours.  Glucose, capillary     Status: Abnormal   Collection Time: 07/11/22  8:23 PM  Result Value Ref Range   Glucose-Capillary 189 (H) 70 - 99 mg/dL    Comment: Glucose reference range applies only to samples taken after fasting  for at least 8 hours.  Glucose, capillary     Status: Abnormal   Collection Time: 07/12/22 12:17 AM  Result Value Ref Range   Glucose-Capillary 228 (H) 70 - 99 mg/dL    Comment: Glucose reference range applies only to samples taken after fasting for at least 8 hours.  Cooxemetry Panel (carboxy, met, total hgb, O2 sat)     Status: Abnormal   Collection Time: 07/12/22  4:35 AM  Result Value Ref Range   Total hemoglobin 8.6 (L) 12.0 - 16.0 g/dL   O2 Saturation 49.5 %   Carboxyhemoglobin 1.9 (H) 0.5 - 1.5 %   Methemoglobin <0.7 0.0 - 1.5 %    Comment: Performed at [facility] [address] [place], [place] [address]  Lactate dehydrogenase     Status: Abnormal   Collection Time: 07/12/22  4:35 AM  Result Value Ref Range   LDH 354 (H) 98 - 192 U/L    Comment: Performed at [facility], [place] [address] [place], [place] [address]  Basic metabolic panel     Status: Abnormal   Collection Time: 07/12/22  4:35 AM  Result Value Ref Range   Sodium 130 (L) 135 - 145 mmol/L   Potassium 4.3 3.5 - 5.1 mmol/L   Chloride 88 (L) 98 - 111 mmol/L   CO2 29 22 - 32 mmol/L   Glucose, Bld 223 (H) 70 - 99 mg/dL    Comment: Glucose reference range applies only to samples taken after fasting for at least 8 hours.   BUN 32 (H) 6 - 20 mg/dL   Creatinine, Ser 1.21 0.61 - 1.24 mg/dL   Calcium 8.6 (L) 8.9 - 10.3 mg/dL   GFR, Estimated >60 >60 mL/min    Comment: (NOTE) Calculated using the CKD-EPI Creatinine Equation (2021)    Anion gap 13 5 - 15    Comment: Performed at [facility] [address] [place], [place] [address]  Protime-INR     Status: Abnormal   Collection Time: 07/12/22  4:35 AM  Result Value Ref Range   Prothrombin Time 24.4 (H) 11.4 - 15.2 seconds   INR 2.2 (H) 0.8 - 1.2    Comment: (NOTE) INR goal varies based on device and disease states. Performed at [facility], [place] [address], [place], [place] [address]   Magnesium     Status: None   Collection Time:  07/12/22  4:35 AM  Result Value Ref Range   Magnesium 2.1 1.7 - 2.4 mg/dL    Comment: Performed at [facility] [address], [place], [place] [address]  CBC     Status: Abnormal   Collection Time: 07/12/22  4:35 AM  Result Value Ref Range   WBC 16.4 (H) 4.0 - 10.5 K/uL   RBC 3.02 (L) 4.22 - 5.81 MIL/uL   Hemoglobin 8.3 (L) 13.0 - 17.0 g/dL   HCT 25.3 (L) 39.0 - 52.0 %   MCV 83.8 80.0 - 100.0 fL   MCH 27.5 26.0 - 34.0 pg   MCHC 32.8 30.0 - 36.0 g/dL   RDW 15.0 11.5 - 15.5 %   Platelets 371 150 - 400 K/uL   nRBC 0.7 (H) 0.0 - 0.2 %    Comment: Performed at [facility], [place] [address] [place], [place] [address]  Glucose, capillary     Status: Abnormal   Collection Time: 07/12/22  4:36 AM  Result Value Ref Range   Glucose-Capillary 229 (H) 70 - 99 mg/dL    Comment: Glucose reference range applies only to samples taken after fasting for at least 8 hours.  I-STAT 7, (LYTES, BLD GAS, ICA, H+H)     Status: Abnormal   Collection Time: 07/12/22  5:33 AM  Result Value Ref Range   pH, Arterial 7.533 (H) 7.35 - 7.45   pCO2 arterial 34.2 32 - 48 mmHg   pO2, Arterial 38 (LL) 83 - 108 mmHg   Bicarbonate 28.7 (H) 20.0 - 28.0 mmol/L   TCO2 30 22 - 32 mmol/L   O2 Saturation 79 %   Acid-Base Excess 6.0 (H) 0.0 - 2.0 mmol/L   Sodium 127 (L) 135 - 145 mmol/L   Potassium 4.0 3.5 - 5.1 mmol/L   Calcium, Ion 1.13 (L) 1.15 - 1.40 mmol/L   HCT 27.0 (L) 39.0 - 52.0 %   Hemoglobin 9.2 (L) 13.0 - 17.0 g/dL   Patient temperature 98.6 F    Collection site RADIAL, ALLEN'S TEST ACCEPTABLE    Drawn by Operator    Sample type ARTERIAL    Comment NOTIFIED PHYSICIAN   Cooxemetry Panel (carboxy, met, total hgb, O2 sat)     Status: Abnormal   Collection Time: 07/12/22  7:40 AM  Result Value Ref Range   Total hemoglobin 8.7 (L) 12.0 - 16.0 g/dL   O2 Saturation 69.9 %   Carboxyhemoglobin 2.2 (H) 0.5 - 1.5 %   Methemoglobin <0.7 0.0 - 1.5 %    Comment: Performed at [facility],  [address] [place], [place] [address]  CBC     Status: Abnormal   Collection Time: 07/12/22  8:10 AM  Result Value Ref Range   WBC 16.9 (H) 4.0 - 10.5 K/uL   RBC 3.04 (L) 4.22 - 5.81 MIL/uL   Hemoglobin 8.6 (L) 13.0 - 17.0 g/dL   HCT 24.8 (L) 39.0 - 52.0 %   MCV 81.6 80.0 - 100.0 fL   MCH 28.3 26.0 - 34.0 pg   MCHC 34.7 30.0 - 36.0 g/dL   RDW 15.0 11.5 - 15.5 %   Platelets 380 150 - 400 K/uL   nRBC 0.5 (H) 0.0 - 0.2 %    Comment: Performed at [facility] [address] [place],  [address]  Glucose, capillary     Status: Abnormal   Collection Time: 07/12/22  8:10 AM  Result Value Ref Range   Glucose-Capillary 238 (H) 70 - 99 mg/dL    Comment: Glucose reference range applies only to samples taken after fasting for at least 8 hours.    DG Chest Port 1 View  Result Date: 07/12/2022 CLINICAL DATA:  Left ventricular assist device. EXAM: PORTABLE CHEST 1 VIEW COMPARISON:  July 10, 2022. FINDINGS: Stable cardiomediastinal silhouette. Right internal jugular catheter is unchanged. Left-sided pacemaker is unchanged. Stable position of left ventricular cyst device. Stable bilateral lung opacities are noted concerning for edema or possibly inflammation. Right-sided PICC line is unchanged. Bony thorax is unremarkable. IMPRESSION: Stable support apparatus. Stable bilateral lung opacities as described above. Electronically Signed   By: Marijo Conception M.D.   On: 07/12/2022 08:25    YWV:[id number] except as listed in admit H&P  Blood pressure (!) 85/72, pulse 99, temperature 98.4 F (36.9 C), temperature source Oral, resp. rate (!) 25, height _0  (1.803 m), weight 89.4 kg, SpO2 100 %.  PHYSICAL EXAM: Overall appearance:  Healthy appearing, in no distress Head:  Normocephalic, atraumatic. Ears: External ears look healthy. Nose: External nose is healthy in appearance.  Right nasal cavity clear, left side with fresh and old blood. Oral Cavity/Pharynx:  There are no mucosal  lesions or masses identified. Larynx/Hypopharynx: Deferred Neuro:  No identifiable neurologic deficits. Neck: No palpable neck masses.  Studies Reviewed: none  Procedures: Treatment of epistaxis:  Both nasal cavities were inspected and topical Afrin/Xylocaine was applied and spray form.  Cotton pledget was then placed with Afrin/Xylocaine left.  Bleeding site was identified in the anteroinferior aspect of the septum.  This was cauterized with silver nitrate.  A Heumann Merisel was cut down to about 5 cm and was inserted without difficulty, inflated with local anesthetic solution.  He tolerated this well.  No further bleeding.   Assessment/Plan: Left-sided anterior epistaxis, chronic anticoagulation.  Left side cauterized.  Packing placed.  Will remove packing Monday if no further bleeding.  If further bleeding contact me for additional treatment.  R04.0 Z79.01  Medical Decision Making: #/Complex Problems: 3  Data Reviewed:1  Management:3 (1-Straightforward, 2-Low, 3-Moderate, 4-High)   Izora Gala 07/12/2022, 8:54 AM

## 2022-07-12 NOTE — Progress Notes (Addendum)
Patient ID: ADALID BECKMANN, male   DOB: Aug 19, 1961, 61 y.o.   MRN: [id number]  Echo assessed at bedside: moderate RV dilation/moderate RV dysfunction (stable), at 5400 rpm aortic valve opens 2/5-3/5 beats and septum appears near midline (respiratory variation noted).   IVC 2.2 cm with >50% respiratory collapse.  Flow 4.6-4.7 L/min at 5400 rpm, we left at this speed.   Co-ox when he is on oxygen was 69%.  Will decrease milrinone to 0.125.  With decrease in milrinone, will make amiodarone po.   CVP 12 lying in bed.  Will continue Lasix today at slightly lower dose 10 mg/hr.   CRITICAL CARE Performed by: Loralie Champagne  Total critical care time: 30 minutes  Critical care time was exclusive of separately billable procedures and treating other patients.  Critical care was necessary to treat or prevent imminent or life-threatening deterioration.  Critical care was time spent personally by me on the following activities: development of treatment plan with patient and/or surrogate as well as nursing, discussions with consultants, evaluation of patient's response to treatment, examination of patient, obtaining history from patient or surrogate, ordering and performing treatments and interventions, ordering and review of laboratory studies, ordering and review of radiographic studies, pulse oximetry and re-evaluation of patient's condition.  Loralie Champagne 07/12/2022 10:52 AM

## 2022-07-12 NOTE — Progress Notes (Signed)
Patient pulse ox unreliable due to VAD. Patient showing no signs of respiratory distress, breathing comfortably, A&O, no complaints. ABG done by RT with patient on room air, due to nose bleeds. Co-ox also done on room air. After reviewing ABG patient placed on NRB by RT.

## 2022-07-12 NOTE — Progress Notes (Signed)
Inpatient Rehab Admissions Coordinator:   Received call from Anda Kraft at [facility] that patient was denied CIR admission. Peer to peer was not done, it was felt that patient had not completed his full course of treatment. Will speak with this patient on Friday after the holiday to see if he would like to appeal.  Will continue to follow.  Rehab Admissons Coordinator [place], [place], [place] [phone]

## 2022-07-12 NOTE — Progress Notes (Signed)
CT surgery p.m. rounds  Patient resting comfortably on Ventimask after having issues with nosebleed treated by ENT with cauterization and packing  VAD speed study today increased speed to 5400 RPM Current flow 4.4 L   Blood pressure (!) 86/73, pulse (!) 107, temperature 98.2 F (36.8 C), temperature source Oral, resp. rate 17, height 5\' 11"  (1.803 m), weight 89.4 kg, SpO2 98 %.

## 2022-07-13 ENCOUNTER — Encounter (HOSPITAL_COMMUNITY): Payer: Self-pay | Admitting: Cardiothoracic Surgery

## 2022-07-13 DIAGNOSIS — Z95811 Presence of heart assist device: Secondary | ICD-10-CM | POA: Diagnosis not present

## 2022-07-13 DIAGNOSIS — I5023 Acute on chronic systolic (congestive) heart failure: Secondary | ICD-10-CM | POA: Diagnosis not present

## 2022-07-13 DIAGNOSIS — I4891 Unspecified atrial fibrillation: Secondary | ICD-10-CM | POA: Diagnosis not present

## 2022-07-13 DIAGNOSIS — R570 Cardiogenic shock: Secondary | ICD-10-CM | POA: Diagnosis not present

## 2022-07-13 LAB — CBC
HCT: 23.5 % — ABNORMAL LOW (ref 39.0–52.0)
Hemoglobin: 8 g/dL — ABNORMAL LOW (ref 13.0–17.0)
MCH: 27.7 pg (ref 26.0–34.0)
MCHC: 34 g/dL (ref 30.0–36.0)
MCV: 81.3 fL (ref 80.0–100.0)
Platelets: 395 10*3/uL (ref 150–400)
RBC: 2.89 MIL/uL — ABNORMAL LOW (ref 4.22–5.81)
RDW: 15.1 % (ref 11.5–15.5)
WBC: 15.5 10*3/uL — ABNORMAL HIGH (ref 4.0–10.5)
nRBC: 0.4 % — ABNORMAL HIGH (ref 0.0–0.2)

## 2022-07-13 LAB — BASIC METABOLIC PANEL
Anion gap: 14 (ref 5–15)
BUN: 27 mg/dL — ABNORMAL HIGH (ref 6–20)
CO2: 31 mmol/L (ref 22–32)
Calcium: 8.6 mg/dL — ABNORMAL LOW (ref 8.9–10.3)
Chloride: 87 mmol/L — ABNORMAL LOW (ref 98–111)
Creatinine, Ser: 1.1 mg/dL (ref 0.61–1.24)
GFR, Estimated: >60 mL/min (ref >60)
Glucose, Bld: 96 mg/dL (ref 70–99)
Potassium: 3.8 mmol/L (ref 3.5–5.1)
Sodium: 132 mmol/L — ABNORMAL LOW (ref 135–145)

## 2022-07-13 LAB — COOXEMETRY PANEL
Carboxyhemoglobin: 1.8 % — ABNORMAL HIGH (ref 0.5–1.5)
Methemoglobin: 2.4 % — ABNORMAL HIGH (ref 0.0–1.5)
O2 Saturation: 64.3 %
Total hemoglobin: 8.6 g/dL — ABNORMAL LOW (ref 12.0–16.0)

## 2022-07-13 LAB — GLUCOSE, CAPILLARY
Glucose-Capillary: 114 mg/dL — ABNORMAL HIGH (ref 70–99)
Glucose-Capillary: 167 mg/dL — ABNORMAL HIGH (ref 70–99)
Glucose-Capillary: 191 mg/dL — ABNORMAL HIGH (ref 70–99)
Glucose-Capillary: 197 mg/dL — ABNORMAL HIGH (ref 70–99)
Glucose-Capillary: 236 mg/dL — ABNORMAL HIGH (ref 70–99)
Glucose-Capillary: 243 mg/dL — ABNORMAL HIGH (ref 70–99)
Glucose-Capillary: 78 mg/dL (ref 70–99)

## 2022-07-13 LAB — LACTATE DEHYDROGENASE: LDH: 315 U/L — ABNORMAL HIGH (ref 98–192)

## 2022-07-13 LAB — PROTIME-INR
INR: 2 — ABNORMAL HIGH (ref 0.8–1.2)
Prothrombin Time: 22.9 seconds — ABNORMAL HIGH (ref 11.4–15.2)

## 2022-07-13 MED ORDER — INFLUENZA VAC SPLIT QUAD 0.5 ML IM SUSY: 0.5000 mL | PREFILLED_SYRINGE | INTRAMUSCULAR | Status: DC

## 2022-07-13 MED ORDER — FUROSEMIDE 40 MG PO TABS
40.0000 mg | ORAL_TABLET | Freq: Every day | ORAL | Status: DC
  Administered 2022-07-13: 40 mg via ORAL
  Filled 2022-07-13: qty 1

## 2022-07-13 MED ORDER — FUROSEMIDE 10 MG/ML IJ SOLN
8.0000 mg/h | INTRAVENOUS | Status: DC
  Administered 2022-07-13: 8 mg/h via INTRAVENOUS

## 2022-07-13 MED ORDER — WARFARIN SODIUM 3 MG PO TABS
3.0000 mg | ORAL_TABLET | Freq: Once | ORAL | Status: AC
  Administered 2022-07-13: 3 mg via ORAL
  Filled 2022-07-13: qty 1

## 2022-07-13 MED ORDER — FUROSEMIDE 10 MG/ML IJ SOLN
10.0000 mg/h | INTRAVENOUS | Status: DC
  Administered 2022-07-13 – 2022-07-14 (×2): 10 mg/h via INTRAVENOUS
  Filled 2022-07-13 (×3): qty 20

## 2022-07-13 MED ORDER — POTASSIUM CHLORIDE CRYS ER 20 MEQ PO TBCR
40.0000 meq | EXTENDED_RELEASE_TABLET | Freq: Once | ORAL | Status: AC
  Administered 2022-07-13: 40 meq via ORAL
  Filled 2022-07-13: qty 2

## 2022-07-13 MED ORDER — PIPERACILLIN-TAZOBACTAM 3.375 G IVPB
3.3750 g | Freq: Three times a day (TID) | INTRAVENOUS | Status: AC
  Administered 2022-07-13 – 2022-07-19 (×20): 3.375 g via INTRAVENOUS
  Filled 2022-07-13 (×19): qty 50

## 2022-07-13 MED ORDER — PNEUMOCOCCAL 20-VAL CONJ VACC 0.5 ML IM SUSY
0.5000 mL | PREFILLED_SYRINGE | INTRAMUSCULAR | Status: DC
  Filled 2022-07-13: qty 0.5

## 2022-07-13 NOTE — Progress Notes (Signed)
Patient ID: Darryl Frye, male   DOB: Apr 19, 1961, 61 y.o.   MRN: [id number] Advanced Heart Failure VAD Team Note  PCP-Cardiologist: Carlyle Dolly, MD   Subjective:    - 11/13 S/P HMIII L atrial appendage closure.  - 11/16 Extubated . GIven 40 mg IV and started on lasix drip 10 mg per/hr.  - 11/17 Developed A fib RVR. Given bolus 150 x 2. Unchanged.  - 11/18 Bedside echo 11/18 LV small. VAD cannula well aligned. AoV opening every beat. Septum pulling to left RV moderately HK. Small to moderate anterior effusion - 11/22 Ramp echo - 11/22 Nasal cautery and packing by ENT   Had nasal cautery and packing yesterday by ENT. Ramp echo also done. Milrinone turned down to 0.125. Co-ox 64%. Remains on lasix gtt at 8. Out 4.2L overnight. Weight down 3 pounds.  Remains SOB. On FM O2.   Sputum culture + for capnocytophaga. CCM switching to zosyn given persistent infiltrates on CXR  MAPs 80-90s  SOB with mild exertion. No CP or palpitations.   LVAD INTERROGATION:  HeartMate III LVAD:   Flow 4.9 liters/min, speed 5400, power 4 , PI 3.6 LDH 315  Objective:    Vital Signs:   Temp:  [97.6 F (36.4 C)-98.9 F (37.2 C)] 98.6 F (37 C) (11/23 0815) Pulse Rate:  [89-117] 99 (11/23 0800) Resp:  [16-32] 27 (11/23 0800) BP: (81-113)/(55-96) 89/75 (11/23 0800) SpO2:  [75 %-100 %] 95 % (11/23 0800) FiO2 (%):  [55 %] 55 % (11/23 0815) Last BM Date : 07/12/22 Mean arterial Pressure 80-90s   Intake/Output:   Intake/Output Summary (Last 24 hours) at 07/13/2022 0906 Last data filed at 07/13/2022 0800 Gross per 24 hour  Intake 1342.11 ml  Output 4120 ml  Net -2777.89 ml      Physical Exam   General:  Sitting in bed HEENT: normal  on FM + left nare packed Neck: supple. JVP 10  Carotids 2+ bilat; no bruits. No lymphadenopathy or thryomegaly appreciated. Cor: LVAD hum.  Lungs: Clear. Abdomen: soft, nontender, + distended. No hepatosplenomegaly. No bruits or masses. Good bowel sounds.  Driveline site clean. Anchor in place.  Extremities: no cyanosis, clubbing, rash. Warm 1+ edema  Neuro: alert & oriented x 3. No focal deficits. Moves all 4 without problem    Telemetry   NSR 90s Personally reviewed   Labs   Basic Metabolic Panel: Recent Labs  Lab 07/07/22 0354 07/07/22 0510 07/08/22 0408 07/08/22 0421 07/09/22 5027 07/09/22 7412 07/10/22 8786 07/10/22 7672 07/10/22 1404 07/11/22 0426 07/11/22 0617 07/12/22 0435 07/12/22 0533 07/12/22 1319 07/13/22 0504  NA 137   < > 136   < > 136   < > 135   < > 134* 134* 129* 130* 127* 126* 132*  K 3.8   < > 3.0*   < > 3.0*   < > 3.7   < > 3.8 3.9 4.1 4.3 4.0 4.0 3.8  CL 102   < > 97*   < > 96*   < > 97*  --  92* 91*  --  88*  --  86* 87*  CO2 21*   < > 23   < > 26   < > 27  --  26 28  --  29  --  27 31  GLUCOSE 206*   < > 151*   < > 126*   < > 146*  --  187* 135*  --  223*  --  285* 96  BUN 28*   < > 25*   < > 25*   < > 21*  --  19 17  --  32*  --  34* 27*  CREATININE 1.37*   < > 1.27*   < > 1.18   < > 1.00  --  1.10 1.07  --  1.21  --  1.24 1.10  CALCIUM 8.4*   < > 8.4*   < > 8.2*   < > 8.5*  --  8.7* 8.7*  --  8.6*  --  8.4* 8.6*  MG 2.3   < > 2.0  --  1.9  --  1.8  --   --  1.8  --  2.1  --   --   --   PHOS 3.5  --  3.3  --  2.1*  --  1.5*  --   --  2.8  --   --   --   --   --    < > = values in this interval not displayed.     Liver Function Tests: Recent Labs  Lab 07/07/22 0354  AST 57*  ALT 21  ALKPHOS 89  BILITOT 0.7  PROT 6.6  ALBUMIN 2.9*    No results for input(s): "LIPASE", "AMYLASE" in the last 168 hours. No results for input(s): "AMMONIA" in the last 168 hours.  CBC: Recent Labs  Lab 07/07/22 0354 07/07/22 0510 07/08/22 0408 07/08/22 0421 07/09/22 0259 07/09/22 0865 07/10/22 0355 07/10/22 7846 07/11/22 0426 07/11/22 0617 07/12/22 0435 07/12/22 0533 07/12/22 0810 07/13/22 0504  WBC 20.9*  --  17.4*  --  11.5*  --  11.0*  --  15.1*  --  16.4*  --  16.9* 15.5*  NEUTROABS 18.6*   --  15.2*  --  9.3*  --  8.4*  --  11.9*  --   --   --   --   --   HGB 11.4*   < > 10.3*   < > 9.4*   < > 9.4*   < > 9.3* 10.2* 8.3* 9.2* 8.6* 8.0*  HCT 33.1*   < > 29.6*   < > 26.2*   < > 28.0*   < > 27.2* 30.0* 25.3* 27.0* 24.8* 23.5*  MCV 83.6  --  82.0  --  80.9  --  82.1  --  80.5  --  83.8  --  81.6 81.3  PLT 222  --  220  --  211  --  271  --  306  --  371  --  380 395   < > = values in this interval not displayed.     INR: Recent Labs  Lab 07/09/22 0259 07/10/22 0355 07/11/22 0426 07/12/22 0435 07/13/22 0504  INR 3.0* 2.4* 2.2* 2.2* 2.0*   :    Imaging   ECHOCARDIOGRAM LIMITED  Result Date: 07/12/2022    ECHOCARDIOGRAM LIMITED REPORT   Patient Name:   GIOVONNIE TRETTEL Kurtz Date of Exam: 07/12/2022 Medical Rec #:  [id number]      Height:       71.0 in Accession #:    [id number]     Weight:       197.1 lb Date of Birth:  [birth year]      BSA:          2.095 m Patient Age:    72 years       BP:           0/0 mmHg  Patient Gender: M              HR:           95 bpm. Exam Location:  Inpatient Procedure: Limited Echo, Cardiac Doppler and Limited Color Doppler Indications:    LVAD evaluation Z95.811  History:        Patient has prior history of Echocardiogram examinations, most                 recent 07/07/2022. CHF, CAD, Pacemaker, Arrythmias:Atrial                 Flutter; Risk Factors:Hypertension and Diabetes.  Sonographer:    Bernadene Person RDCS Referring Phys: Helen  1. LVAD inflow cannula at apex. Left ventricular ejection fraction, by estimation, is 20%. The left ventricle has severely decreased function. The left ventricle demonstrates global hypokinesis.  2. Ramp echo, speed increased to 5400 with septum at midline and aortic valve opening 2/3 beats. Speed left at 5400 rpm.  3. Right ventricular systolic function is moderately reduced. The right ventricular size is moderately enlarged. There is mildly elevated pulmonary artery systolic pressure. The estimated  right ventricular systolic pressure is 51.0 mmHg.  4. Right atrial size was mildly dilated.  5. The mitral valve is normal in structure. Trivial mitral valve regurgitation. No evidence of mitral stenosis.  6. Aortic valve opens 2/3 beats at 5400 rpm. The aortic valve is tricuspid. Aortic valve regurgitation is not visualized.  7. The inferior vena cava is dilated in size with >50% respiratory variability, suggesting right atrial pressure of 8 mmHg.  8. A small pericardial effusion is present. The pericardial effusion is posterior to the left ventricle. FINDINGS  Left Ventricle: LVAD inflow cannula at apex. Left ventricular ejection fraction, by estimation, is 20%. The left ventricle has severely decreased function. The left ventricle demonstrates global hypokinesis. The left ventricular internal cavity size was  normal in size. There is no left ventricular hypertrophy. Right Ventricle: The right ventricular size is moderately enlarged. Right ventricular systolic function is moderately reduced. There is mildly elevated pulmonary artery systolic pressure. The tricuspid regurgitant velocity is 2.80 m/s, and with an assumed right atrial pressure of 8 mmHg, the estimated right ventricular systolic pressure is 25.8 mmHg. Left Atrium: Left atrial size was normal in size. Right Atrium: Right atrial size was mildly dilated. Pericardium: A small pericardial effusion is present. The pericardial effusion is posterior to the left ventricle. Mitral Valve: The mitral valve is normal in structure. Trivial mitral valve regurgitation. No evidence of mitral valve stenosis. Tricuspid Valve: The tricuspid valve is normal in structure. Tricuspid valve regurgitation is trivial. Aortic Valve: Aortic valve opens 2/3 beats at 5400 rpm. The aortic valve is tricuspid. Aortic valve regurgitation is not visualized. Pulmonic Valve: The pulmonic valve was normal in structure. Pulmonic valve regurgitation is trivial. Venous: The inferior vena cava  is dilated in size with greater than 50% respiratory variability, suggesting right atrial pressure of 8 mmHg. IAS/Shunts: No atrial level shunt detected by color flow Doppler. Additional Comments: A device lead is visualized in the right ventricle. Spectral Doppler performed. Color Doppler performed.  RIGHT VENTRICLE RV S prime:     7.43 cm/s PULMONIC VALVE RVOT Peak grad: 3 mmHg  TRICUSPID VALVE TR Peak grad:   31.4 mmHg TR Vmax:        280.00 cm/s  SHUNTS Pulmonic VTI: 0.106 m [place] Electronically signed by Franki Monte Signature Date/Time: 07/12/2022/4:38:28 PM  Final    CT CHEST WO CONTRAST  Result Date: 07/12/2022 CLINICAL DATA:  Aspiration. EXAM: CT CHEST WITHOUT CONTRAST TECHNIQUE: Multidetector CT imaging of the chest was performed following the standard protocol without IV contrast. RADIATION DOSE REDUCTION: This exam was performed according to the departmental dose-optimization program which includes automated exposure control, adjustment of the mA and/or kV according to patient size and/or use of iterative reconstruction technique. COMPARISON:  06/28/2022 FINDINGS: Cardiovascular: The heart is enlarged. Trace pericardial effusion. Left ventricular assist device evident. Mediastinum/Nodes: Upper normal lymph nodes are seen in the mediastinum. No evidence for gross hilar lymphadenopathy although assessment is limited by the lack of intravenous contrast on the current study. The esophagus has normal imaging features. There is no axillary lymphadenopathy. Lungs/Pleura: Interval development of diffuse interstitial and patchy alveolar opacity with areas of atelectasis/volume loss and architectural distortion. Mild collapse/consolidative opacity is seen in the dependent lung bases bilaterally, left greater than right. No pneumothorax. Small bilateral pleural effusions evident. Upper Abdomen: 9 mm short axis gastrohepatic ligament lymph node is upper normal for size. Musculoskeletal: No  worrisome lytic or sclerotic osseous abnormality. IMPRESSION: 1. Interval development of diffuse interstitial and patchy alveolar opacity with areas of atelectasis/volume loss and architectural distortion. Imaging features are compatible with infectious/inflammatory etiology. Pulmonary edema a consideration. 2. Mild collapse/consolidative opacity in the dependent lung bases bilaterally, left greater than right. 3. Small bilateral pleural effusions. 4. Cardiomegaly with trace pericardial effusion. Electronically Signed   By: Misty Stanley M.D.   On: 07/12/2022 16:19   DG Chest Port 1 View  Result Date: 07/12/2022 CLINICAL DATA:  Left ventricular assist device. EXAM: PORTABLE CHEST 1 VIEW COMPARISON:  July 10, 2022. FINDINGS: Stable cardiomediastinal silhouette. Right internal jugular catheter is unchanged. Left-sided pacemaker is unchanged. Stable position of left ventricular cyst device. Stable bilateral lung opacities are noted concerning for edema or possibly inflammation. Right-sided PICC line is unchanged. Bony thorax is unremarkable. IMPRESSION: Stable support apparatus. Stable bilateral lung opacities as described above. Electronically Signed   By: Marijo Conception M.D.   On: 07/12/2022 08:25     Medications:     Scheduled Medications:  sodium chloride   Intravenous Once   amiodarone  200 mg Oral BID   aspirin  81 mg Oral Daily   atorvastatin  20 mg Oral Daily   bisacodyl  10 mg Oral Daily   Or   bisacodyl  10 mg Rectal Daily   Chlorhexidine Gluconate Cloth  6 each Topical Daily   digoxin  0.125 mg Oral Daily   feeding supplement  237 mL Oral TID BM   insulin aspart  0-24 Units Subcutaneous Q4H   insulin glargine-yfgn  25 Units Subcutaneous BID   ipratropium-albuterol  3 mL Nebulization Q6H   levothyroxine  25 mcg Oral Q0600   lidocaine  2 patch Transdermal Q24H   magnesium oxide  400 mg Oral BID   melatonin  3 mg Oral QHS   multivitamin with minerals  1 tablet Oral Daily    pantoprazole  40 mg Oral Daily   polyethylene glycol  17 g Oral BID   senna-docusate  1 tablet Oral BID   sertraline  50 mg Oral Daily   sildenafil  20 mg Oral TID   sodium chloride flush  10-40 mL Intracatheter Q12H   sodium chloride flush  10-40 mL Intracatheter Q12H   sodium chloride flush  3 mL Intravenous Q12H   sodium chloride flush  3 mL Intravenous Q12H  spironolactone  25 mg Oral Daily   warfarin  3 mg Oral ONCE-1600   Warfarin - Physician Dosing Inpatient   Does not apply q1600    Infusions:  sodium chloride Stopped (07/07/22 1346)   sodium chloride     sodium chloride Stopped (07/12/22 1638)   furosemide (LASIX) 200 mg in dextrose 5 % 100 mL (2 mg/mL) infusion 10 mg/hr (07/13/22 0800)   lactated ringers     lactated ringers Stopped (07/04/22 2207)   lactated ringers 20 mL/hr at 07/13/22 0800   milrinone 0.125 mcg/kg/min (07/13/22 0800)    PRN Medications: sodium chloride, acetaminophen, ALPRAZolam, dextrose, hydrALAZINE, lactated ringers, morphine injection, ondansetron (ZOFRAN) IV, ondansetron (ZOFRAN) IV, mouth rinse, oxyCODONE, oxymetazoline, sodium chloride flush, sodium chloride flush, sodium chloride flush, traMADol, tranexamic acid   Patient Profile   Admitted with A/C HFrEF --> cardiogenic shock. S/P HMIII LVAD  Assessment/Plan:    1. Acute on chronic systolic CHF/cardiogenic shock: Nonischemic cardiomyopathy, diagnosed 2020.  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  Cath in 12/20 with no significant coronary disease.  [employer] ICD. Most recent echo in 2/23 showed EF 20-25% with normal RV.  Recently, patient has been symptomatically worse, NYHA class IV with profound orthopnea. RHC showed markedly elevated filling pressures, primarily pulmonary venous hypertension, low cardiac  output, and low PAPI.  Patient was admitted for inotrope and diuresis. Echo this admission with EF <20%, mild RV dysfunction.  He was started on milrinone with improvement in hemodynamics.  We proceeded with HM3 LVAD with LAA clip on 11/14.  He is currently on milrinone 0.125 and sildenafil 20 tid.  Co-ox 64% CVP 10-11 - Continue milrinone 0.125a t least one more day  - Continue sildenafil 20 tid.  - Continue IV lasix - Continue 25 mg spiro daily.  - Continue digoxin 0.125 daily.  2. A fib RVR : H/O S/p flutter ablation.  Back in NSR.  - Continue IV amiodarone 30 mg/hr while on milrinone.  - Continue warfarin.  3. VAD management: s/p HM-3 VAD on 11/14.  Bedside echo 11/18 LV small, VAD cannula well aligned, AoV opening every beat, septum pulling to left RV moderately HK, small to moderate anterior effusion. Ramp echo 11/22 RV mild to mod HK. Speed turned to 5400 LDH 315. MAP 80-90 - Now on warfarin with INR 2.0 - ASA 81. - VAD interrogated personally. Parameters stable. 4. H/o DVT: On anticoagulation.  5. Acute hypoxemic respiratory failure, post-op. 11/16 extubated.  Bilateral lower lobe infiltrates on CXR. CVP trending down but CXR has not improved, still with bilateral infiltrates and significant oxygen requirement.  He has been on amiodarone this admission for atrial fibrillation, was not on amiodarone before this hospitalization.  Will need to think about amiodarone lung toxicity, discussed with Dr Tacy Learn who does not think particularly likely but will need to monitor for improvement.  It is also possible that he aspirated.  Sputum culture + for capnocytophaga. CCM switching to zosyn given persistent infiltrates on CXR - Add Zosyn per CCM  - Continue IV lasix for diuresis - Incentive spirometry.  - Encourage mobility.  - CT chest w/o contrast, discussed with Dr. Tenny Craw.  - CCM following.  6. AKI: Creatinine stable at 1.1 - Diuretic management as above 7. ID: Afebrile, WBCs 16.  He has  completed empiric abx  for ?post-op PNA. Sputum culture + for capnocytophaga. CCM switching to zosyn given persistent infiltrates on CXR 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  9. Hypokalemia - Continue spironolactone.  10. Epistaxis: s/p cauterization/packing 11/22 - stable 11. Anemia: Hgb lower today at 8.0 in setting of severe epistaxis.  - Transfuse hgb < 7.5 at this point.  10. Deconditioning - PT/OT following  - CIR  CRITICAL CARE Performed by: Glori Bickers  Total critical care time: 35 minutes  Critical care time was exclusive of separately billable procedures and treating other patients.  Critical care was necessary to treat or prevent imminent or life-threatening deterioration.  Critical care was time spent personally by me (independent of midlevel providers or residents) on the following activities: development of treatment plan with patient and/or surrogate as well as nursing, discussions with consultants, evaluation of patient's response to treatment, examination of patient, obtaining history from patient or surrogate, ordering and performing treatments and interventions, ordering and review of laboratory studies, ordering and review of radiographic studies, pulse oximetry and re-evaluation of patient's condition.  Quillian Quince Clarise Chacko 07/13/2022 9:06 AM

## 2022-07-13 NOTE — Progress Notes (Signed)
[address] Lakes[address]       [place] [address]             [phone]      9 Days Post-Op  Procedure(s) (LRB): INSERTION OF IMPLANTABLE LEFT VENTRICULAR ASSIST DEVICE; ATRICLIP 45 (N/A) TRANSESOPHAGEAL ECHOCARDIOGRAM (TEE) (N/A)   Total Length of Stay:  LOS: 16 days   SUBJECTIVE: Sitting in chair, comfortable. Gets "winded" going from bed to chair. Did walk in halls and also some SOB with that Tolerating diet   Vitals:   07/13/22 0600 07/13/22 0630  BP:    Pulse: 94 94  Resp: (!) 23 (!) 24  Temp:    SpO2: 97% 92%    Intake/Output      11/22 0701 11/23 0700 11/23 0701 11/24 0700   P.O. 1200    I.V. (mL/kg) 298.8 (3.3)    IV Piggyback 149.9    Total Intake(mL/kg) 1648.7 (18.4)    Urine (mL/kg/hr) 4175 (1.9)    Stool 0    Chest Tube 20    Total Output 4195    Net -2546.3         Stool Occurrence 1 x        sodium chloride Stopped (07/07/22 1346)   sodium chloride     sodium chloride Stopped (07/12/22 1638)    ceFAZolin (ANCEF) IV Stopped (07/13/22 0539)   furosemide (LASIX) 200 mg in dextrose 5 % 100 mL (2 mg/mL) infusion 10 mg/hr (07/13/22 0600)   lactated ringers     lactated ringers Stopped (07/04/22 2207)   lactated ringers 20 mL/hr at 07/12/22 2000   milrinone 0.125 mcg/kg/min (07/13/22 0600)    CBC    Component Value Date/Time   WBC 15.5 (H) 07/13/2022 0504   RBC 2.89 (L) 07/13/2022 0504   HGB 8.0 (L) 07/13/2022 0504   HCT 23.5 (L) 07/13/2022 0504   PLT 395 07/13/2022 0504   MCV 81.3 07/13/2022 0504   MCH 27.7 07/13/2022 0504   MCHC 34.0 07/13/2022 0504   RDW 15.1 07/13/2022 0504   LYMPHSABS 0.8 07/11/2022 0426   MONOABS 1.6 (H) 07/11/2022 0426   EOSABS 0.5 07/11/2022 0426   BASOSABS 0.1 07/11/2022 0426   CMP     Component Value Date/Time   NA 132 (L) 07/13/2022 0504   NA 135 11/23/2021 0814   K 3.8 07/13/2022 0504   CL 87 (L) 07/13/2022 0504   CO2 31 07/13/2022 0504   GLUCOSE 96 07/13/2022 0504   BUN 27 (H)  07/13/2022 0504   BUN 20 11/23/2021 0814   CREATININE 1.10 07/13/2022 0504   CALCIUM 8.6 (L) 07/13/2022 0504   PROT 6.6 07/07/2022 0354   ALBUMIN 2.9 (L) 07/07/2022 0354   AST 57 (H) 07/07/2022 0354   ALT 21 07/07/2022 0354   ALKPHOS 89 07/07/2022 0354   BILITOT 0.7 07/07/2022 0354   GFRNONAA >60 07/13/2022 0504   GFRAA >60 04/21/2020 1659   ABG    Component Value Date/Time   PHART 7.533 (H) 07/12/2022 0533   PCO2ART 34.2 07/12/2022 0533   PO2ART 38 (LL) 07/12/2022 0533   HCO3 28.7 (H) 07/12/2022 0533   TCO2 30 07/12/2022 0533   ACIDBASEDEF 2.0 07/07/2022 0510   O2SAT 64.3 07/13/2022 0504   CBG (last 3)  Recent Labs    07/13/22 0014 07/13/22 0201 07/13/22 0351  GLUCAP 167* 114* 36  EXAM Lungs: some rhonchi otherwise hard to hear over device Card: RR Ext: trace edema  ASSESSMENT: POD #9  sp VAD Hemodynamics appear stable on present VAD speed.  Continues on milrinone as per AHF team Good diuresis yesterday with over 2.5 l negative, continue lasix drip Encourage deep breathing Slow decline in HCT. Continue to monitor INR 2  continue coumadin. No further nose bleed   Coralie Common, MD @DATE @

## 2022-07-13 NOTE — Progress Notes (Signed)
NAME:  MERCEDES VALERIANO, MRN:  [id number], DOB:  1960-11-02, LOS: 35 ADMISSION DATE:  06/27/2022, CONSULTATION DATE:  11/14 REFERRING MD:  Aundra Dubin, CHIEF COMPLAINT:  critical care support s/p LVAD   History of Present Illness:  61 year old male w/ sig h/o NICM (EF 20-25%), af/flutter. Presented 11/14 for LVAD after failing out-pt medical therapies. Initial flow 4.5 l/min speed 5100 power 3.6 P1 2.3  Pertinent  Medical History  Afib/flutter s/p ablation May 2023 NICM EF 20-25% has ICD Remote h/o heavy ETOH and cocaine Class IV NYHF sxs.   Significant Hospital Events: Including procedures, antibiotic start and stop dates in addition to other pertinent events   11/14 LVAD placed. Returned to ICU full vent support. On iNO 20ppm, Returned to ICU post-op intubated on Neo, epi 6, and amio gtt. Initial hemodynamics PA 34/20 CO 4.9 CI 2.4 co ox 70% l CVP 10-. On full vent support. PCCM asked to assist w/ care  11/15 iNO weaned off. Got lasix. Pump speed increased to 5200. Still on epi and milrione. Awake following commands 11/15 attempted SBT but saturations dropped 11/16 extubated  11/17 mobilizations, remains on pressors   Interim History / Subjective:  Stated could not sleep overnight Started bleeding from left nostril Remains on Amio, milrinone and Lasix infusion Diuresed well with net -1 L and weight is down 3 pounds  Objective   Blood pressure (!) 89/75, pulse 99, temperature 98.6 F (37 C), temperature source Oral, resp. rate (!) 27, height 5\' 11"  (1.803 m), weight 89.4 kg, SpO2 95 %. CVP:  [2 mmHg-18 mmHg] 9 mmHg  FiO2 (%):  [55 %] 55 %   Intake/Output Summary (Last 24 hours) at 07/13/2022 0277 Last data filed at 07/13/2022 0800 Gross per 24 hour  Intake 1342.11 ml  Output 4120 ml  Net -2777.89 ml   Filed Weights   07/10/22 0500 07/11/22 0500 07/12/22 0500  Weight: 93.1 kg 90.9 kg 89.4 kg   Examination: General: Middle-age male, lying in the bed on Ventimask HENT NCAT,  tracking appropriately Heart: Hum from LVAD, midline sternal incision Pulm: Bilateral basal crackles, right more than left, no rhonchi or wheezes Abd: Soft nontender nondistended Ext: Warm dry no edema Neuro: Alert, awake, following commands Skin: No rash  Assessment & Plan:  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 s/p LVAD HM3 Continue milrinone per heart failure service Continue sildenafil and digoxin Remains on amiodarone Lasix infusion was stopped this morning per heart failure team Remains net -2.5 L Coox is 64%  Left nare epistaxis Acute Hypoxemic Respiratory Failure  Bilateral multifocal pneumonia Patient started bleeding from left nostril yesterday, likely due to dryness from high flow nasal cannula oxygen Left nostril was packed by ENT, packing will be removed on Monday His INR is therapeutic at 2 Currently on Ventimask CT chest was done yesterday which showed bilateral patchy multifocal pneumonia Respiratory culture is growing Capnocytophaga Started back on IV Zosyn Titrate with O2 sat goal 92% Continue PT/OT  Continue incentive spirometry and flutter valve  H/o afib/flutter s/p prior ablation Was previously on DOAC, currently in NSR On Amiodarone infusion and digoxin Heart rate control per cardiology Continue Coumadin, INR is 2  H/o DVT Continue Coumadin, INR is therapeutic Appreciate pharmacy to see help.  Hypervolemic hyponatremia Serum sodium remained at 132 Closely monitor  Hypokalemia/hypophosphatemia/hypocalcemia, resolved  Acute kidney injury likely due to cardiorenal syndrome Serum creatinine trended down to 1.1 this morning, likely in the setting of aggressive diuresis Monitor  intake and output Avoid nephrotoxic agents  Best Practice (right click and "Reselect all SmartList Selections" daily)   Diet/type: 2 g sodium diet DVT prophylaxis: Coumadin GI prophylaxis: Protonix Lines: Central line Foley:  Yes, and it is  still needed Code Status:  limited Last date of multidisciplinary goals of care discussion [Per primary team]   Total critical care time: 32 minutes  Performed by: [place] care time was exclusive of separately billable procedures and treating other patients.   Critical care was necessary to treat or prevent imminent or life-threatening deterioration.   Critical care was time spent personally by me on the following activities: development of treatment plan with patient and/or surrogate as well as nursing, discussions with consultants, evaluation of patient's response to treatment, examination of patient, obtaining history from patient or surrogate, ordering and performing treatments and interventions, ordering and review of laboratory studies, ordering and review of radiographic studies, pulse oximetry and re-evaluation of patient's condition.   Jacky Kindle, MD [facility] See Amion for pager If no response to pager, please call [phone] until 7pm After 7pm, Please call E-link [phone]

## 2022-07-13 NOTE — Progress Notes (Signed)
     [address]       [place], [address]             [phone]       EVENING ROUNDS  Still on face mask and feels breathing about the same Continues with good diuresis Continue present care

## 2022-07-14 ENCOUNTER — Inpatient Hospital Stay (HOSPITAL_COMMUNITY): Payer: Medicare Other

## 2022-07-14 DIAGNOSIS — R570 Cardiogenic shock: Secondary | ICD-10-CM | POA: Diagnosis not present

## 2022-07-14 DIAGNOSIS — I50811 Acute right heart failure: Secondary | ICD-10-CM | POA: Diagnosis not present

## 2022-07-14 DIAGNOSIS — I4891 Unspecified atrial fibrillation: Secondary | ICD-10-CM | POA: Diagnosis not present

## 2022-07-14 DIAGNOSIS — I5023 Acute on chronic systolic (congestive) heart failure: Secondary | ICD-10-CM | POA: Diagnosis not present

## 2022-07-14 LAB — BASIC METABOLIC PANEL
Anion gap: 15 (ref 5–15)
BUN: 25 mg/dL — ABNORMAL HIGH (ref 6–20)
CO2: 30 mmol/L (ref 22–32)
Calcium: 8.7 mg/dL — ABNORMAL LOW (ref 8.9–10.3)
Chloride: 88 mmol/L — ABNORMAL LOW (ref 98–111)
Creatinine, Ser: 1.22 mg/dL (ref 0.61–1.24)
GFR, Estimated: >60 mL/min (ref >60)
Glucose, Bld: 96 mg/dL (ref 70–99)
Potassium: 3.9 mmol/L (ref 3.5–5.1)
Sodium: 133 mmol/L — ABNORMAL LOW (ref 135–145)

## 2022-07-14 LAB — POCT I-STAT 7, (LYTES, BLD GAS, ICA,H+H)
Acid-Base Excess: 10 mmol/L — ABNORMAL HIGH (ref 0.0–2.0)
Bicarbonate: 33.1 mmol/L — ABNORMAL HIGH (ref 20.0–28.0)
Calcium, Ion: 1.13 mmol/L — ABNORMAL LOW (ref 1.15–1.40)
HCT: 26 % — ABNORMAL LOW (ref 39.0–52.0)
Hemoglobin: 8.8 g/dL — ABNORMAL LOW (ref 13.0–17.0)
O2 Saturation: 99 %
Patient temperature: 98.6
Potassium: 4 mmol/L (ref 3.5–5.1)
Sodium: 129 mmol/L — ABNORMAL LOW (ref 135–145)
TCO2: 34 mmol/L — ABNORMAL HIGH (ref 22–32)
pCO2 arterial: 37.1 mmHg (ref 32–48)
pH, Arterial: 7.557 — ABNORMAL HIGH (ref 7.35–7.45)
pO2, Arterial: 113 mmHg — ABNORMAL HIGH (ref 83–108)

## 2022-07-14 LAB — CBC
HCT: 23.9 % — ABNORMAL LOW (ref 39.0–52.0)
Hemoglobin: 8 g/dL — ABNORMAL LOW (ref 13.0–17.0)
MCH: 27.7 pg (ref 26.0–34.0)
MCHC: 33.5 g/dL (ref 30.0–36.0)
MCV: 82.7 fL (ref 80.0–100.0)
Platelets: 462 10*3/uL — ABNORMAL HIGH (ref 150–400)
RBC: 2.89 MIL/uL — ABNORMAL LOW (ref 4.22–5.81)
RDW: 15.3 % (ref 11.5–15.5)
WBC: 16.6 10*3/uL — ABNORMAL HIGH (ref 4.0–10.5)
nRBC: 0.3 % — ABNORMAL HIGH (ref 0.0–0.2)

## 2022-07-14 LAB — PROTIME-INR
INR: 2 — ABNORMAL HIGH (ref 0.8–1.2)
Prothrombin Time: 22.7 seconds — ABNORMAL HIGH (ref 11.4–15.2)

## 2022-07-14 LAB — COOXEMETRY PANEL
Carboxyhemoglobin: 2.1 % — ABNORMAL HIGH (ref 0.5–1.5)
Methemoglobin: 0.8 % (ref 0.0–1.5)
O2 Saturation: 60.5 %
Total hemoglobin: 6.9 g/dL — CL (ref 12.0–16.0)

## 2022-07-14 LAB — GLUCOSE, CAPILLARY
Glucose-Capillary: 200 mg/dL — ABNORMAL HIGH (ref 70–99)
Glucose-Capillary: 210 mg/dL — ABNORMAL HIGH (ref 70–99)
Glucose-Capillary: 233 mg/dL — ABNORMAL HIGH (ref 70–99)
Glucose-Capillary: 260 mg/dL — ABNORMAL HIGH (ref 70–99)
Glucose-Capillary: 77 mg/dL (ref 70–99)
Glucose-Capillary: 92 mg/dL (ref 70–99)

## 2022-07-14 LAB — LACTATE DEHYDROGENASE: LDH: 316 U/L — ABNORMAL HIGH (ref 98–192)

## 2022-07-14 MED ORDER — IPRATROPIUM-ALBUTEROL 0.5-2.5 (3) MG/3ML IN SOLN
3.0000 mL | Freq: Three times a day (TID) | RESPIRATORY_TRACT | Status: DC
  Administered 2022-07-15 – 2022-07-19 (×12): 3 mL via RESPIRATORY_TRACT
  Filled 2022-07-14 (×14): qty 3

## 2022-07-14 MED ORDER — ACETAZOLAMIDE 250 MG PO TABS
250.0000 mg | ORAL_TABLET | Freq: Two times a day (BID) | ORAL | Status: AC
  Administered 2022-07-14 (×2): 250 mg via ORAL
  Filled 2022-07-14 (×2): qty 1

## 2022-07-14 MED ORDER — POTASSIUM CHLORIDE CRYS ER 20 MEQ PO TBCR
40.0000 meq | EXTENDED_RELEASE_TABLET | Freq: Once | ORAL | Status: AC
  Administered 2022-07-14: 40 meq via ORAL
  Filled 2022-07-14: qty 2

## 2022-07-14 MED ORDER — FE FUM-VIT C-VIT B12-FA 460-60-0.01-1 MG PO CAPS
1.0000 | ORAL_CAPSULE | Freq: Every day | ORAL | Status: DC
  Administered 2022-07-15 – 2022-07-21 (×7): 1 via ORAL
  Filled 2022-07-14 (×7): qty 1

## 2022-07-14 MED ORDER — INSULIN GLARGINE-YFGN 100 UNIT/ML ~~LOC~~ SOLN
20.0000 [IU] | Freq: Every day | SUBCUTANEOUS | Status: DC
  Administered 2022-07-14: 20 [IU] via SUBCUTANEOUS
  Filled 2022-07-14 (×2): qty 0.2

## 2022-07-14 MED ORDER — INSULIN ASPART 100 UNIT/ML IJ SOLN
0.0000 [IU] | Freq: Three times a day (TID) | INTRAMUSCULAR | Status: DC
  Administered 2022-07-14: 4 [IU] via SUBCUTANEOUS
  Administered 2022-07-14 – 2022-07-15 (×2): 8 [IU] via SUBCUTANEOUS
  Administered 2022-07-15: 16 [IU] via SUBCUTANEOUS
  Administered 2022-07-15 (×2): 4 [IU] via SUBCUTANEOUS
  Administered 2022-07-16: 8 [IU] via SUBCUTANEOUS
  Administered 2022-07-16 (×2): 2 [IU] via SUBCUTANEOUS
  Administered 2022-07-16: 8 [IU] via SUBCUTANEOUS
  Administered 2022-07-17 (×2): 4 [IU] via SUBCUTANEOUS
  Administered 2022-07-17: 12 [IU] via SUBCUTANEOUS
  Administered 2022-07-17 – 2022-07-18 (×2): 8 [IU] via SUBCUTANEOUS
  Administered 2022-07-18 (×2): 12 [IU] via SUBCUTANEOUS
  Administered 2022-07-18: 2 [IU] via SUBCUTANEOUS
  Administered 2022-07-19: 16 [IU] via SUBCUTANEOUS

## 2022-07-14 MED ORDER — INSULIN GLARGINE-YFGN 100 UNIT/ML ~~LOC~~ SOLN
25.0000 [IU] | Freq: Every day | SUBCUTANEOUS | Status: DC
  Administered 2022-07-14 – 2022-07-21 (×8): 25 [IU] via SUBCUTANEOUS
  Filled 2022-07-14 (×9): qty 0.25

## 2022-07-14 MED ORDER — WARFARIN SODIUM 5 MG PO TABS
5.0000 mg | ORAL_TABLET | Freq: Once | ORAL | Status: AC
  Administered 2022-07-14: 5 mg via ORAL
  Filled 2022-07-14: qty 1

## 2022-07-14 NOTE — Progress Notes (Signed)
Inpatient Rehab Admissions Coordinator:   Met with patient and family at bedside. Discussed insurance denial and patient would like to appeal. Updated clinicals sent to [facility]. Will await insurance decision. Continue to follow.   Rehab Admissons Coordinator [place], [place], [place] [phone]

## 2022-07-14 NOTE — Plan of Care (Signed)
  Problem: Education: Goal: Understanding of CV disease, CV risk reduction, and recovery process will improve Outcome: Progressing Goal: Individualized Educational Video(s) Outcome: Progressing   Problem: Activity: Goal: Ability to return to baseline activity level will improve Outcome: Progressing   Problem: Cardiovascular: Goal: Ability to achieve and maintain adequate cardiovascular perfusion will improve Outcome: Progressing Goal: Vascular access site(s) Level 0-1 will be maintained Outcome: Progressing   Problem: Health Behavior/Discharge Planning: Goal: Ability to safely manage health-related needs after discharge will improve Outcome: Progressing   Problem: Education: Goal: Ability to describe self-care measures that may prevent or decrease complications (Diabetes Survival Skills Education) will improve Outcome: Progressing Goal: Individualized Educational Video(s) Outcome: Progressing   Problem: Coping: Goal: Ability to adjust to condition or change in health will improve Outcome: Progressing   Problem: Fluid Volume: Goal: Ability to maintain a balanced intake and output will improve Outcome: Progressing   Problem: Health Behavior/Discharge Planning: Goal: Ability to identify and utilize available resources and services will improve Outcome: Progressing Goal: Ability to manage health-related needs will improve Outcome: Progressing   Problem: Metabolic: Goal: Ability to maintain appropriate glucose levels will improve Outcome: Progressing   Problem: Nutritional: Goal: Maintenance of adequate nutrition will improve Outcome: Progressing Goal: Progress toward achieving an optimal weight will improve Outcome: Progressing   Problem: Skin Integrity: Goal: Risk for impaired skin integrity will decrease Outcome: Progressing   Problem: Tissue Perfusion: Goal: Adequacy of tissue perfusion will improve Outcome: Progressing   Problem: Education: Goal: Knowledge  of General Education information will improve Description: Including pain rating scale, medication(s)/side effects and non-pharmacologic comfort measures Outcome: Progressing   Problem: Health Behavior/Discharge Planning: Goal: Ability to manage health-related needs will improve Outcome: Progressing   Problem: Clinical Measurements: Goal: Ability to maintain clinical measurements within normal limits will improve Outcome: Progressing Goal: Will remain free from infection Outcome: Progressing Goal: Diagnostic test results will improve Outcome: Progressing Goal: Respiratory complications will improve Outcome: Progressing Goal: Cardiovascular complication will be avoided Outcome: Progressing   Problem: Activity: Goal: Risk for activity intolerance will decrease Outcome: Progressing   Problem: Nutrition: Goal: Adequate nutrition will be maintained Outcome: Progressing   Problem: Coping: Goal: Level of anxiety will decrease Outcome: Progressing   Problem: Elimination: Goal: Will not experience complications related to bowel motility Outcome: Progressing Goal: Will not experience complications related to urinary retention Outcome: Progressing   Problem: Pain Managment: Goal: General experience of comfort will improve Outcome: Progressing   Problem: Safety: Goal: Ability to remain free from injury will improve Outcome: Progressing   Problem: Skin Integrity: Goal: Risk for impaired skin integrity will decrease Outcome: Progressing   Problem: Education: Goal: Knowledge of the prescribed therapeutic regimen will improve Outcome: Progressing   Problem: Activity: Goal: Risk for activity intolerance will decrease Outcome: Progressing   Problem: Cardiac: Goal: Ability to maintain an adequate cardiac output will improve Outcome: Progressing   Problem: Coping: Goal: Level of anxiety will decrease Outcome: Progressing   Problem: Fluid Volume: Goal: Risk for excess  fluid volume will decrease Outcome: Progressing   Problem: Clinical Measurements: Goal: Ability to maintain clinical measurements within normal limits will improve Outcome: Progressing Goal: Will remain free from infection Outcome: Progressing   Problem: Respiratory: Goal: Will regain and/or maintain adequate ventilation Outcome: Progressing

## 2022-07-14 NOTE — Progress Notes (Signed)
     [address]       [place] [address]             [phone]       EVENING ROUNDS  Continues with O2 via mask with unchanged symptomatic SOB Negative nearly 800cc Eating well

## 2022-07-14 NOTE — Progress Notes (Signed)
Occupational Therapy Treatment Patient Details Name: Darryl Frye MRN: [id number] DOB: May 01, 1961 Today's Date: 07/14/2022   History of present illness Pt is a 61 y.o. male presenting 06/27/22 with CHF for RHC; admitted for inotrope and diuresis.  Other PMH includes NICM s/p ICD, aflutter, CAD, DM, DVT, HTN, heart murmur, renal disorder.  S/p LVAD 11/14.   OT comments  Pt is progressing steadily. Maintaining O2 sats 93-94% with ambulation, but fatigues easily with increased RR requiring seated rest breaks. Pt does not require physical assist for bed mobility. He needs min assist with chair follow for ambulation with eva walker. Mod assist for LVAD vest and front opening gown. Pt is able to change power sources with supervision. Pt remained up in chair for lunch meal with visitors.    Recommendations for follow up therapy are one component of a multi-disciplinary discharge planning process, led by the attending physician.  Recommendations may be updated based on patient status, additional functional criteria and insurance authorization.    Follow Up Recommendations  Acute inpatient rehab (3hours/day)     Assistance Recommended at Discharge Frequent or constant Supervision/Assistance  Patient can return home with the following  Two people to help with walking and/or transfers;A lot of help with bathing/dressing/bathroom;Assistance with cooking/housework;Direct supervision/assist for medications management;Direct supervision/assist for financial management;Assist for transportation;Help with stairs or ramp for entrance   Equipment Recommendations  None recommended by OT    Recommendations for Other Services      Precautions / Restrictions Precautions Precautions: Fall;Sternal Precaution Comments: LVAD, high flow 02 via venturi mask       Mobility Bed Mobility Overal bed mobility: Needs Assistance Bed Mobility: Supine to Sit     Supine to sit: Min guard, HOB elevated      General bed mobility comments: assist for lines    Transfers Overall transfer level: Needs assistance Equipment used:  (EVA walker) Transfers: Sit to/from Stand Sit to Stand: Min assist, +2 safety/equipment           General transfer comment: stood from bed x 1, chair x 4, cues for hands on knees     Balance Overall balance assessment: Needs assistance   Sitting balance-Leahy Scale: Fair     Standing balance support: Bilateral upper extremity supported Standing balance-Leahy Scale: Poor                             ADL either performed or assessed with clinical judgement   ADL Overall ADL's : Needs assistance/impaired                 Upper Body Dressing : Maximal assistance;Sitting Upper Body Dressing Details (indicate cue type and reason): LVAD vest and backside gown                   General ADL Comments: Pt able to change LVAD power sources with min verbal cues.    Extremity/Trunk Assessment              Vision       Perception     Praxis      Cognition Arousal/Alertness: Awake/alert Behavior During Therapy: Flat affect Overall Cognitive Status: Within Functional Limits for tasks assessed                                          Exercises  Shoulder Instructions       General Comments      Pertinent Vitals/ Pain       Pain Assessment Pain Assessment: Faces Faces Pain Scale: Hurts a little bit Pain Location: chest with deep breaths Pain Descriptors / Indicators: Sore Pain Intervention(s): Monitored during session, Repositioned  Home Living                                          Prior Functioning/Environment              Frequency  Min 2X/week        Progress Toward Goals  OT Goals(current goals can now be found in the care plan section)  Progress towards OT goals: Progressing toward goals  Acute Rehab OT Goals OT Goal Formulation: With patient Time For  Goal Achievement: 07/21/22 Potential to Achieve Goals: Good  Plan Discharge plan remains appropriate    Co-evaluation    PT/OT/SLP Co-Evaluation/Treatment: Yes Reason for Co-Treatment: Complexity of the patient's impairments (multi-system involvement)   OT goals addressed during session: ADL's and self-care      AM-PAC OT "6 Clicks" Daily Activity     Outcome Measure   Help from another person eating meals?: None Help from another person taking care of personal grooming?: A Little Help from another person toileting, which includes using toliet, bedpan, or urinal?: A Lot Help from another person bathing (including washing, rinsing, drying)?: A Lot Help from another person to put on and taking off regular upper body clothing?: A Little Help from another person to put on and taking off regular lower body clothing?: A Lot 6 Click Score: 16    End of Session Equipment Utilized During Treatment: Oxygen (eva walker)  OT Visit Diagnosis: Unsteadiness on feet (R26.81);Muscle weakness (generalized) (M62.81);Pain   Activity Tolerance Patient tolerated treatment well   Patient Left in chair;with call bell/phone within reach;with family/visitor present   Nurse Communication Mobility status        Time: 1188-6773 OT Time Calculation (min): 37 min  Charges: OT General Charges $OT Visit: 1 Visit OT Treatments $Therapeutic Activity: 8-22 mins  [name], OTR/L Acute Rehabilitation Services Office: [phone]  [name] 07/14/2022, 1:48 PM

## 2022-07-14 NOTE — Progress Notes (Signed)
NAME:  Darryl Frye, MRN:  [id number], DOB:  [birth year], LOS: 67 ADMISSION DATE:  06/27/2022, CONSULTATION DATE:  11/14 REFERRING MD:  Aundra Dubin, CHIEF COMPLAINT:  critical care support s/p LVAD   History of Present Illness:  61 year old male w/ sig h/o NICM (EF 20-25%), af/flutter. Presented 11/14 for LVAD after failing out-pt medical therapies. Initial flow 4.5 l/min speed 5100 power 3.6 P1 2.3  Pertinent  Medical History  Afib/flutter s/p ablation May 2023 NICM EF 20-25% has ICD Remote h/o heavy ETOH and cocaine Class IV NYHF sxs.   Significant Hospital Events: Including procedures, antibiotic start and stop dates in addition to other pertinent events   11/14 LVAD placed. Returned to ICU full vent support. On iNO 20ppm, Returned to ICU post-op intubated on Neo, epi 6, and amio gtt. Initial hemodynamics PA 34/20 CO 4.9 CI 2.4 co ox 70% l CVP 10-. On full vent support. PCCM asked to assist w/ care  11/15 iNO weaned off. Got lasix. Pump speed increased to 5200. Still on epi and milrione. Awake following commands 11/15 attempted SBT but saturations dropped 11/16 extubated  11/17 mobilizations, remains on pressors   Interim History / Subjective:   Remains on Amio, milrinone and Lasix infusion Diuresed well with net -1.5 L  Objective   Blood pressure (!) 83/48, pulse (!) 105, temperature 98.2 F (36.8 C), temperature source Oral, resp. rate (!) 22, height 5\' 11"  (1.803 m), weight 87.1 kg, SpO2 95 %. CVP:  [9 mmHg-11 mmHg] 10 mmHg  FiO2 (%):  [50 %-55 %] 50 %   Intake/Output Summary (Last 24 hours) at 07/14/2022 0850 Last data filed at 07/14/2022 9924 Gross per 24 hour  Intake 1537.29 ml  Output 3195 ml  Net -1657.71 ml   Filed Weights   07/11/22 0500 07/12/22 0500 07/14/22 0500  Weight: 90.9 kg 89.4 kg 87.1 kg   Examination: General: Middle-age male, sitting in the couch on Ventimask HENT: NCAT, tracking appropriately Heart: Hum from LVAD, midline sternal incision Pulm:  Bilateral basal crackles, right more than left, no rhonchi or wheezes Abd: Soft nontender nondistended Ext: Warm dry no edema Neuro: Alert, awake, following commands Skin: No rash  Assessment & Plan:  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 s/p LVAD HM3 Continue milrinone per heart failure service Continue sildenafil and digoxin Remains on amiodarone Restarted back on Lasix infusion Remains net -1.5 L Coox is 60%  Left nare epistaxis Acute Hypoxemic Respiratory Failure  Bilateral multifocal pneumonia Metabolic alkalosis Patient started bleeding from left nostril yesterday, likely due to dryness from high flow nasal cannula oxygen Left nostril was packed by ENT, packing will be removed on Monday His INR is therapeutic at 2 Currently on Ventimask No more bleeding from left nostril now CT chest was done showed bilateral patchy multifocal pneumonia Respiratory culture is growing Capnocytophaga Continue IV Zosyn to complete 7 days therapy Titrate with O2 sat goal 92% Continue PT/OT  Continue incentive spirometry and flutter valve Due to aggressive diuresis patient is going into metabolic alkalosis, will give him acetazolamide to 50 mg twice daily for 2 doses  H/o afib/flutter s/p prior ablation Was previously on DOAC, currently in NSR On Amiodarone infusion and digoxin Heart rate control per cardiology Continue Coumadin, INR is 2  H/o DVT Continue Coumadin, INR is therapeutic Appreciate pharmacy to see help.  Hypervolemic hyponatremia Serum sodium remained at 133 Closely monitor  Hypokalemia/hypophosphatemia/hypocalcemia, resolved  Acute kidney injury likely due to cardiorenal syndrome Serum creatinine  remained stable around 1.1-1.2 Monitor intake and output Avoid nephrotoxic agents  Best Practice (right click and "Reselect all SmartList Selections" daily)   Diet/type: 2 g sodium diet DVT prophylaxis: Coumadin GI prophylaxis:  Protonix Lines: Central line Foley:  Yes, and it is still needed Code Status:  limited Last date of multidisciplinary goals of care discussion [Per primary team]   Total critical care time: 31 minutes  Performed by: [facility] care time was exclusive of separately billable procedures and treating other patients.   Critical care was necessary to treat or prevent imminent or life-threatening deterioration.   Critical care was time spent personally by me on the following activities: development of treatment plan with patient and/or surrogate as well as nursing, discussions with consultants, evaluation of patient's response to treatment, examination of patient, obtaining history from patient or surrogate, ordering and performing treatments and interventions, ordering and review of laboratory studies, ordering and review of radiographic studies, pulse oximetry and re-evaluation of patient's condition.   Jacky Kindle, MD [facility] See Amion for pager If no response to pager, please call [phone] until 7pm After 7pm, Please call E-link [phone]

## 2022-07-14 NOTE — Progress Notes (Signed)
[address]       [place] [address]             [phone]      10 Days Post-Op  Procedure(s) (LRB): INSERTION OF IMPLANTABLE LEFT VENTRICULAR ASSIST DEVICE; ATRICLIP 45 (N/A) TRANSESOPHAGEAL ECHOCARDIOGRAM (TEE) (N/A)   Total Length of Stay:  LOS: 17 days   SUBJECTIVE: Continues on Face mask for oxygenation Has been walking for very short distances  Vitals:   07/14/22 0925 07/14/22 1000  BP: (!) 87/78 94/75  Pulse: 92 89  Resp: 18 (!) 21  Temp:    SpO2: 99% 95%    Intake/Output      11/23 0701 11/24 0700 11/24 0701 11/25 0700   P.O. 1490 240   I.V. (mL/kg) 189.8 (2.2) 27.6 (0.3)   IV Piggyback 114.2 35.8   Total Intake(mL/kg) 1794 (20.6) 303.4 (3.5)   Urine (mL/kg/hr) 3340 (1.6)    Stool 0    Chest Tube     Total Output 3340    Net -1546 +303.4        Stool Occurrence 1 x        sodium chloride Stopped (07/07/22 1346)   sodium chloride     sodium chloride Stopped (07/12/22 1638)   furosemide (LASIX) 200 mg in dextrose 5 % 100 mL (2 mg/mL) infusion 10 mg/hr (07/14/22 1000)   lactated ringers     lactated ringers Stopped (07/04/22 2207)   lactated ringers Stopped (07/14/22 0801)   milrinone 0.125 mcg/kg/min (07/14/22 1000)   piperacillin-tazobactam (ZOSYN)  IV Stopped (07/14/22 0936)    CBC    Component Value Date/Time   WBC 16.6 (H) 07/14/2022 0942   RBC 2.89 (L) 07/14/2022 0942   HGB 8.0 (L) 07/14/2022 0942   HCT 23.9 (L) 07/14/2022 0942   PLT 462 (H) 07/14/2022 0942   MCV 82.7 07/14/2022 0942   MCH 27.7 07/14/2022 0942   MCHC 33.5 07/14/2022 0942   RDW 15.3 07/14/2022 0942   LYMPHSABS 0.8 07/11/2022 0426   MONOABS 1.6 (H) 07/11/2022 0426   EOSABS 0.5 07/11/2022 0426   BASOSABS 0.1 07/11/2022 0426   CMP     Component Value Date/Time   NA 129 (L) 07/14/2022 0513   NA 135 11/23/2021 0814   K 4.0 07/14/2022 0513   CL 88 (L) 07/14/2022 0400   CO2 30 07/14/2022 0400   GLUCOSE 96 07/14/2022 0400   BUN 25 (H)  07/14/2022 0400   BUN 20 11/23/2021 0814   CREATININE 1.22 07/14/2022 0400   CALCIUM 8.7 (L) 07/14/2022 0400   PROT 6.6 07/07/2022 0354   ALBUMIN 2.9 (L) 07/07/2022 0354   AST 57 (H) 07/07/2022 0354   ALT 21 07/07/2022 0354   ALKPHOS 89 07/07/2022 0354   BILITOT 0.7 07/07/2022 0354   GFRNONAA >60 07/14/2022 0400   GFRAA >60 04/21/2020 1659   ABG    Component Value Date/Time   PHART 7.557 (H) 07/14/2022 0513   PCO2ART 37.1 07/14/2022 0513   PO2ART 113 (H) 07/14/2022 0513   HCO3 33.1 (H) 07/14/2022 0513   TCO2 34 (H) 07/14/2022 0513   ACIDBASEDEF 2.0 07/07/2022 0510   O2SAT 99 07/14/2022 0513   CBG (last 3)  Recent Labs    07/14/22 0007 07/14/22 0404 07/14/22 0659  GLUCAP 260* 77 92   EXAM Lungs: transmitted LVAD noise Card: transmitted LVAD noise Ext: warm  ASSESSMENT: SP LVAD Has some low MBP this am Contraction alkalosis being treated with diamox,  lasix continues Will give coumadin 5mg  today  Coralie Common, MD @DATE @

## 2022-07-14 NOTE — Progress Notes (Signed)
Physical Therapy Treatment Patient Details Name: Darryl Frye MRN: [id number] DOB: [birth year] Today's Date: 07/14/2022   History of Present Illness Pt is a 61 y.o. male presenting 06/27/22 with CHF for RHC; admitted for inotrope and diuresis.  Other PMH includes NICM s/p ICD, aflutter, CAD, DM, DVT, HTN, heart murmur, renal disorder.  S/p LVAD 11/14.    PT Comments    Pt admitted with above diagnosis. Pt was able to ambulate with EVa walker increasing distance with min guard assist with close chair follow as pt needs to rest at times. Pt making steady gains. Needs AIR to progress to level that he can manage at home.  Pt currently with functional limitations due to balance and eendurance deficits. Pt will benefit from skilled PT to increase their independence and safety with mobility to allow discharge to the venue listed below.      Recommendations for follow up therapy are one component of a multi-disciplinary discharge planning process, led by the attending physician.  Recommendations may be updated based on patient status, additional functional criteria and insurance authorization.  Follow Up Recommendations  Acute inpatient rehab (3hours/day)     Assistance Recommended at Discharge Frequent or constant Supervision/Assistance  Patient can return home with the following A little help with walking and/or transfers;A little help with bathing/dressing/bathroom;Assistance with cooking/housework;Assist for transportation;Help with stairs or ramp for entrance   Equipment Recommendations  BSC/3in1;Rollator (4 wheels)    Recommendations for Other Services Rehab consult     Precautions / Restrictions Precautions Precautions: Fall;Sternal Precaution Booklet Issued: No Precaution Comments: LVAD, high flow 02 via venturi mask Restrictions Weight Bearing Restrictions: Yes (sternal precautions) Other Position/Activity Restrictions: UE sternal precautions     Mobility  Bed Mobility Overal  bed mobility: Needs Assistance Bed Mobility: Supine to Sit     Supine to sit: Min guard, HOB elevated     General bed mobility comments: assist for lines    Transfers Overall transfer level: Needs assistance Equipment used:  (EVA walker) Transfers: Sit to/from Stand Sit to Stand: Min assist, +2 safety/equipment           General transfer comment: stood from bed x 1, chair x 4, cues for hands on knees    Ambulation/Gait Ambulation/Gait assistance: Min assist, +2 physical assistance, +2 safety/equipment (chair follow) Gait Distance (Feet): 195 Feet Assistive device: Ethelene Hal Gait Pattern/deviations: Step-through pattern, Decreased stride length, Drifts right/left, Trunk flexed, Antalgic Gait velocity: decreased Gait velocity interpretation: <1.31 ft/sec, indicative of household ambulator   General Gait Details: Verbal cues for glute activation, upright posture, quad activation, activity pacing. Pt fatigues and had to have several sitting rest breaks. Chair follow utilized.   Stairs             Wheelchair Mobility    Modified Rankin (Stroke Patients Only)       Balance Overall balance assessment: Needs assistance Sitting-balance support: Feet supported Sitting balance-Leahy Scale: Fair     Standing balance support: Bilateral upper extremity supported Standing balance-Leahy Scale: Poor Standing balance comment: reliant on external support                            Cognition Arousal/Alertness: Awake/alert Behavior During Therapy: Flat affect Overall Cognitive Status: Within Functional Limits for tasks assessed  Exercises General Exercises - Lower Extremity Pence Arc Quad: Both, 10 reps, Seated Heel Slides: Both, 5 reps, Supine Hip Flexion/Marching: Both, 10 reps, Seated    General Comments General comments (skin integrity, edema, etc.): VSS with 50% face mask in place. LVAD  parameters were WNL during session.  Pt was able to tell therapist all of equipment to gather except for back up bag Pt was able to manipulate equipment and switch to and from wall power to battery and back.      Pertinent Vitals/Pain Pain Assessment Pain Assessment: Faces Faces Pain Scale: Hurts a little bit Pain Location: chest with deep breaths Pain Descriptors / Indicators: Sore Pain Intervention(s): Limited activity within patient's tolerance, Monitored during session, Repositioned    Home Living                          Prior Function            PT Goals (current goals can now be found in the care plan section) Acute Rehab PT Goals Patient Stated Goal: feel better Progress towards PT goals: Progressing toward goals    Frequency    Min 3X/week      PT Plan Current plan remains appropriate    Co-evaluation PT/OT/SLP Co-Evaluation/Treatment: Yes Reason for Co-Treatment: Complexity of the patient's impairments (multi-system involvement);For patient/therapist safety PT goals addressed during session: Mobility/safety with mobility OT goals addressed during session: ADL's and self-care      AM-PAC PT "6 Clicks" Mobility   Outcome Measure  Help needed turning from your back to your side while in a flat bed without using bedrails?: A Little Help needed moving from lying on your back to sitting on the side of a flat bed without using bedrails?: A Little Help needed moving to and from a bed to a chair (including a wheelchair)?: A Little Help needed standing up from a chair using your arms (e.g., wheelchair or bedside chair)?: A Little Help needed to walk in hospital room?: A Little Help needed climbing 3-5 steps with a railing? : Total 6 Click Score: 16    End of Session Equipment Utilized During Treatment: Oxygen;Gait belt Activity Tolerance: Patient limited by fatigue Patient left: with call bell/phone within reach;in chair;with chair alarm set;with  family/visitor present Nurse Communication: Mobility status PT Visit Diagnosis: Unsteadiness on feet (R26.81);Difficulty in walking, not elsewhere classified (R26.2)     Time: 4709-6283 PT Time Calculation (min) (ACUTE ONLY): 33 min  Charges:  $Gait Training: 8-22 mins                     Fatumata Kashani M,PT Acute Rehab Services [place] 07/14/2022, 2:15 PM

## 2022-07-14 NOTE — Progress Notes (Addendum)
Patient ID: Darryl Frye, male   DOB: 10-15-1960, 61 y.o.   MRN: [id number] Advanced Heart Failure VAD Team Note  PCP-Cardiologist: Carlyle Dolly, MD   Subjective:    - 11/13 S/P HMIII L atrial appendage closure.  - 11/16 Extubated . GIven 40 mg IV and started on lasix drip 10 mg per/hr.  - 11/17 Developed A fib RVR. Given bolus 150 x 2. Unchanged.  - 11/18 Bedside echo 11/18 LV small. VAD cannula well aligned. AoV opening every beat. Septum pulling to left RV moderately HK. Small to moderate anterior effusion - 11/22 Ramp echo - 11/22 Nasal cautery and packing by ENT. Ramp echo. Speed increased to 5400.   CO-OX 60% on milrinone 0.125.  CVP 8-9. On lasix gtt at 10/hr. Diamox 250 BID for 2 doses d/t metabolic alkalosis per CCM. 3.3L UOP yesterday. Weight down another 5 lb.   Sputum culture + for capnocytophaga. CCM switched to zosyn given persistent infiltrates on CXR  MAPs primarily 80s. Reviewed with RN.   O2 requirement slowly improving, FM 14L>12L. Continues to have dyspnea with mild exertion.    LVAD INTERROGATION:  HeartMate III LVAD:   Flow 4.6 liters/min, speed 5400, power 4 , PI 3.3 LDH stable 316  Objective:    Vital Signs:   Temp:  [97.6 F (36.4 C)-98.7 F (37.1 C)] 98.2 F (36.8 C) (11/24 0800) Pulse Rate:  [89-166] 105 (11/24 0700) Resp:  [14-28] 22 (11/24 0700) BP: (75-115)/(42-102) 83/48 (11/24 0700) SpO2:  [76 %-100 %] 95 % (11/24 0700) FiO2 (%):  [50 %-55 %] 50 % (11/24 0800) Weight:  [87.1 kg] 87.1 kg (11/24 0500) Last BM Date : 07/13/22 Mean arterial Pressure 80s  Intake/Output:   Intake/Output Summary (Last 24 hours) at 07/14/2022 0833 Last data filed at 07/14/2022 0488 Gross per 24 hour  Intake 1537.29 ml  Output 3195 ml  Net -1657.71 ml     Physical Exam   Physical Exam: GENERAL: Sitting up in chair. No distress. HEENT: + left nare packing, on FM NECK: Supple, JVP 8-10 cm.  2+ bilaterally, no bruits.   CARDIAC:  Mechanical heart  sounds with LVAD hum present. Sternum stable LUNGS:  Clear to auscultation bilaterally.  ABDOMEN:  Soft, nontender, + distended, bowel sounds present.   LVAD exit site: Dressing dry and intact.  Stabilization device present and accurately applied.   EXTREMITIES:  Warm and dry, no cyanosis, clubbing, rash or edema  NEUROLOGIC:  Alert and oriented x 4.  Gait steady.  No aphasia.  No dysarthria.  Affect pleasant.       Telemetry   SR/ST 90s-100s (personally reviewed)   Labs   Basic Metabolic Panel: Recent Labs  Lab 07/08/22 0408 07/08/22 0421 07/09/22 0259 07/09/22 8916 07/10/22 0355 07/10/22 9450 07/11/22 0426 07/11/22 0617 07/12/22 0435 07/12/22 0533 07/12/22 1319 07/13/22 0504 07/14/22 0400 07/14/22 0513  NA 136   < > 136   < > 135   < > 134*   < > 130* 127* 126* 132* 133* 129*  K 3.0*   < > 3.0*   < > 3.7   < > 3.9   < > 4.3 4.0 4.0 3.8 3.9 4.0  CL 97*   < > 96*   < > 97*   < > 91*  --  88*  --  86* 87* 88*  --   CO2 23   < > 26   < > 27   < > 28  --  29  --  27 31 30   --   GLUCOSE 151*   < > 126*   < > 146*   < > 135*  --  223*  --  285* 96 96  --   BUN 25*   < > 25*   < > 21*   < > 17  --  32*  --  34* 27* 25*  --   CREATININE 1.27*   < > 1.18   < > 1.00   < > 1.07  --  1.21  --  1.24 1.10 1.22  --   CALCIUM 8.4*   < > 8.2*   < > 8.5*   < > 8.7*  --  8.6*  --  8.4* 8.6* 8.7*  --   MG 2.0  --  1.9  --  1.8  --  1.8  --  2.1  --   --   --   --   --   PHOS 3.3  --  2.1*  --  1.5*  --  2.8  --   --   --   --   --   --   --    < > = values in this interval not displayed.    Liver Function Tests: No results for input(s): "AST", "ALT", "ALKPHOS", "BILITOT", "PROT", "ALBUMIN" in the last 168 hours. No results for input(s): "LIPASE", "AMYLASE" in the last 168 hours. No results for input(s): "AMMONIA" in the last 168 hours.  CBC: Recent Labs  Lab 07/08/22 0408 07/08/22 0421 07/09/22 0259 07/09/22 3086 07/10/22 0355 07/10/22 5784 07/11/22 0426 07/11/22 0617  07/12/22 0435 07/12/22 0533 07/12/22 0810 07/13/22 0504 07/14/22 0513  WBC 17.4*  --  11.5*  --  11.0*  --  15.1*  --  16.4*  --  16.9* 15.5*  --   NEUTROABS 15.2*  --  9.3*  --  8.4*  --  11.9*  --   --   --   --   --   --   HGB 10.3*   < > 9.4*   < > 9.4*   < > 9.3*   < > 8.3* 9.2* 8.6* 8.0* 8.8*  HCT 29.6*   < > 26.2*   < > 28.0*   < > 27.2*   < > 25.3* 27.0* 24.8* 23.5* 26.0*  MCV 82.0  --  80.9  --  82.1  --  80.5  --  83.8  --  81.6 81.3  --   PLT 220  --  211  --  271  --  306  --  371  --  380 395  --    < > = values in this interval not displayed.    INR: Recent Labs  Lab 07/10/22 0355 07/11/22 0426 07/12/22 0435 07/13/22 0504 07/14/22 0400  INR 2.4* 2.2* 2.2* 2.0* 2.0*  :    Imaging   ECHOCARDIOGRAM LIMITED  Result Date: 07/12/2022    ECHOCARDIOGRAM LIMITED REPORT   Patient Name:   Darryl Frye Violet Date of Exam: 07/12/2022 Medical Rec #:  [id number]      Height:       71.0 in Accession #:    [id number]     Weight:       197.1 lb Date of Birth:  10-08-1960      BSA:          2.095 m Patient Age:    69 years       BP:  0/0 mmHg Patient Gender: M              HR:           95 bpm. Exam Location:  Inpatient Procedure: Limited Echo, Cardiac Doppler and Limited Color Doppler Indications:    LVAD evaluation Z95.811  History:        Patient has prior history of Echocardiogram examinations, most                 recent 07/07/2022. CHF, CAD, Pacemaker, Arrythmias:Atrial                 Flutter; Risk Factors:Hypertension and Diabetes.  Sonographer:    Bernadene Person RDCS Referring Phys: [place]  1. LVAD inflow cannula at apex. Left ventricular ejection fraction, by estimation, is 20%. The left ventricle has severely decreased function. The left ventricle demonstrates global hypokinesis.  2. Ramp echo, speed increased to 5400 with septum at midline and aortic valve opening 2/3 beats. Speed left at 5400 rpm.  3. Right ventricular systolic function is  moderately reduced. The right ventricular size is moderately enlarged. There is mildly elevated pulmonary artery systolic pressure. The estimated right ventricular systolic pressure is 59.9 mmHg.  4. Right atrial size was mildly dilated.  5. The mitral valve is normal in structure. Trivial mitral valve regurgitation. No evidence of mitral stenosis.  6. Aortic valve opens 2/3 beats at 5400 rpm. The aortic valve is tricuspid. Aortic valve regurgitation is not visualized.  7. The inferior vena cava is dilated in size with >50% respiratory variability, suggesting right atrial pressure of 8 mmHg.  8. A small pericardial effusion is present. The pericardial effusion is posterior to the left ventricle. FINDINGS  Left Ventricle: LVAD inflow cannula at apex. Left ventricular ejection fraction, by estimation, is 20%. The left ventricle has severely decreased function. The left ventricle demonstrates global hypokinesis. The left ventricular internal cavity size was  normal in size. There is no left ventricular hypertrophy. Right Ventricle: The right ventricular size is moderately enlarged. Right ventricular systolic function is moderately reduced. There is mildly elevated pulmonary artery systolic pressure. The tricuspid regurgitant velocity is 2.80 m/s, and with an assumed right atrial pressure of 8 mmHg, the estimated right ventricular systolic pressure is 35.7 mmHg. Left Atrium: Left atrial size was normal in size. Right Atrium: Right atrial size was mildly dilated. Pericardium: A small pericardial effusion is present. The pericardial effusion is posterior to the left ventricle. Mitral Valve: The mitral valve is normal in structure. Trivial mitral valve regurgitation. No evidence of mitral valve stenosis. Tricuspid Valve: The tricuspid valve is normal in structure. Tricuspid valve regurgitation is trivial. Aortic Valve: Aortic valve opens 2/3 beats at 5400 rpm. The aortic valve is tricuspid. Aortic valve regurgitation is  not visualized. Pulmonic Valve: The pulmonic valve was normal in structure. Pulmonic valve regurgitation is trivial. Venous: The inferior vena cava is dilated in size with greater than 50% respiratory variability, suggesting right atrial pressure of 8 mmHg. IAS/Shunts: No atrial level shunt detected by color flow Doppler. Additional Comments: A device lead is visualized in the right ventricle. Spectral Doppler performed. Color Doppler performed.  RIGHT VENTRICLE RV S prime:     7.43 cm/s PULMONIC VALVE RVOT Peak grad: 3 mmHg  TRICUSPID VALVE TR Peak grad:   31.4 mmHg TR Vmax:        280.00 cm/s  SHUNTS Pulmonic VTI: 0.106 m [place] Electronically signed by Franki Monte Signature Date/Time: 07/12/2022/4:38:28  PM    Final    CT CHEST WO CONTRAST  Result Date: 07/12/2022 CLINICAL DATA:  Aspiration. EXAM: CT CHEST WITHOUT CONTRAST TECHNIQUE: Multidetector CT imaging of the chest was performed following the standard protocol without IV contrast. RADIATION DOSE REDUCTION: This exam was performed according to the departmental dose-optimization program which includes automated exposure control, adjustment of the mA and/or kV according to patient size and/or use of iterative reconstruction technique. COMPARISON:  06/28/2022 FINDINGS: Cardiovascular: The heart is enlarged. Trace pericardial effusion. Left ventricular assist device evident. Mediastinum/Nodes: Upper normal lymph nodes are seen in the mediastinum. No evidence for gross hilar lymphadenopathy although assessment is limited by the lack of intravenous contrast on the current study. The esophagus has normal imaging features. There is no axillary lymphadenopathy. Lungs/Pleura: Interval development of diffuse interstitial and patchy alveolar opacity with areas of atelectasis/volume loss and architectural distortion. Mild collapse/consolidative opacity is seen in the dependent lung bases bilaterally, left greater than right. No pneumothorax. Small  bilateral pleural effusions evident. Upper Abdomen: 9 mm short axis gastrohepatic ligament lymph node is upper normal for size. Musculoskeletal: No worrisome lytic or sclerotic osseous abnormality. IMPRESSION: 1. Interval development of diffuse interstitial and patchy alveolar opacity with areas of atelectasis/volume loss and architectural distortion. Imaging features are compatible with infectious/inflammatory etiology. Pulmonary edema a consideration. 2. Mild collapse/consolidative opacity in the dependent lung bases bilaterally, left greater than right. 3. Small bilateral pleural effusions. 4. Cardiomegaly with trace pericardial effusion. Electronically Signed   By: Misty Stanley M.D.   On: 07/12/2022 16:19     Medications:     Scheduled Medications:  sodium chloride   Intravenous Once   amiodarone  200 mg Oral BID   aspirin  81 mg Oral Daily   atorvastatin  20 mg Oral Daily   bisacodyl  10 mg Oral Daily   Or   bisacodyl  10 mg Rectal Daily   Chlorhexidine Gluconate Cloth  6 each Topical Daily   digoxin  0.125 mg Oral Daily   feeding supplement  237 mL Oral TID BM   [START ON 07/19/2022] influenza vac split quadrivalent PF  0.5 mL Intramuscular Tomorrow-1000   insulin aspart  0-24 Units Subcutaneous Q4H   insulin glargine-yfgn  25 Units Subcutaneous BID   ipratropium-albuterol  3 mL Nebulization Q6H   levothyroxine  25 mcg Oral Q0600   lidocaine  2 patch Transdermal Q24H   magnesium oxide  400 mg Oral BID   melatonin  3 mg Oral QHS   multivitamin with minerals  1 tablet Oral Daily   pantoprazole  40 mg Oral Daily   [START ON 07/19/2022] pneumococcal 20-valent conjugate vaccine  0.5 mL Intramuscular Tomorrow-1000   polyethylene glycol  17 g Oral BID   senna-docusate  1 tablet Oral BID   sertraline  50 mg Oral Daily   sildenafil  20 mg Oral TID   sodium chloride flush  10-40 mL Intracatheter Q12H   sodium chloride flush  10-40 mL Intracatheter Q12H   sodium chloride flush  3 mL  Intravenous Q12H   sodium chloride flush  3 mL Intravenous Q12H   spironolactone  25 mg Oral Daily   Warfarin - Physician Dosing Inpatient   Does not apply q1600    Infusions:  sodium chloride Stopped (07/07/22 1346)   sodium chloride     sodium chloride Stopped (07/12/22 1638)   furosemide (LASIX) 200 mg in dextrose 5 % 100 mL (2 mg/mL) infusion 10 mg/hr (07/14/22 4481)  lactated ringers     lactated ringers Stopped (07/04/22 2207)   lactated ringers 20 mL/hr at 07/14/22 0638   milrinone 0.125 mcg/kg/min (07/14/22 6644)   piperacillin-tazobactam (ZOSYN)  IV 12.5 mL/hr at 07/14/22 0638    PRN Medications: sodium chloride, acetaminophen, ALPRAZolam, dextrose, hydrALAZINE, lactated ringers, morphine injection, ondansetron (ZOFRAN) IV, ondansetron (ZOFRAN) IV, mouth rinse, oxyCODONE, oxymetazoline, sodium chloride flush, sodium chloride flush, sodium chloride flush, traMADol, tranexamic acid   Patient Profile   Admitted with A/C HFrEF --> cardiogenic shock. S/P HMIII LVAD  Assessment/Plan:    1. Acute on chronic systolic CHF/cardiogenic shock: Nonischemic cardiomyopathy, diagnosed 2020.  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  Cath in 12/20 with no significant coronary disease.  [employer] ICD. Most recent echo in 2/23 showed EF 20-25% with normal RV.  Recently, patient has been symptomatically worse, NYHA class IV with profound orthopnea. RHC showed markedly elevated filling pressures, primarily pulmonary venous hypertension, low cardiac output, and low PAPI.  Patient was admitted for inotrope and diuresis. Echo this admission with EF <20%, mod RV dysfunction.  He was started on milrinone with improvement in hemodynamics.  We proceeded with HM3 LVAD with LAA clip on 11/14.  He is currently on milrinone 0.125  and sildenafil 20 tid.  Co-ox 60%. CVP 8-9. Continue lasix gtt + diamox X 2. Potentially switch to PO diuretic tomorrow. - Continue milrinone 0.125 while on lasix gtt. - Continue sildenafil 20 tid.  - Continue 25 mg spiro daily.  - Continue digoxin 0.125 daily.  2. A fib RVR : H/O S/p flutter ablation.  Back in NSR.  - Continue po amiodarone 200 BID - Continue warfarin.  3. VAD management: s/p HM-3 VAD on 11/14.  Bedside echo 11/18 LV small, VAD cannula well aligned, AoV opening every beat, septum pulling to left RV moderately HK, small to moderate anterior effusion. Ramp echo 11/22 RV mild to mod HK. Speed turned to 5400 LDH 316. MAP 80s - Now on warfarin with INR 2.0 - ASA 81. - VAD interrogated personally. Parameters stable. 4. H/o DVT: On anticoagulation.  5. Acute hypoxemic respiratory failure, post-op. 11/16 extubated.  Bilateral lower lobe infiltrates on CXR. CVP trending down but CXR has not improved, still with bilateral infiltrates and significant oxygen requirement.  He has been on amiodarone this admission for atrial fibrillation, was not on amiodarone before this hospitalization.  Will need to think about amiodarone lung toxicity, discussed with Dr Tacy Learn who does not think particularly likely but will need to monitor for improvement.  It is also possible that he aspirated.  Sputum culture + for capnocytophaga. CCM switched to zosyn given persistent infiltrates on CXR - Continue IV lasix for diuresis - Incentive spirometry.  - Encourage mobility.  - CT chest w/o contrast, discussed with Dr. Tenny Craw. Diffuse interstitial and patchy alveolar opacities - CCM following.  6. AKI: Creatinine stable at 1.1 - Diuretic management as above 7. ID: Afebrile, AM CBC pending. He has completed empiric abx for ?post-op PNA. Sputum culture + for capnocytophaga. CCM switched to zosyn given persistent infiltrates on imaging 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  9. Hypokalemia - Continue  spironolactone.  10. Epistaxis: s/p cauterization/packing 11/22 - stable 11. Anemia: Hgb 6.9 on CO-OX. Checking CBC - Transfuse hgb < 7.5 at this point.  10. Deconditioning - PT/OT following  -  CIR  FINCH, LINDSAY N 07/14/2022 8:33 AM  Agree with above.   Co-ox stable on milrinone. CVP 8-9 on lasix gtt. Still SOB requiring FM O2. ABG with metabolic alkalosis (on diamox).   INR 2.0. Off heparin.   General:  Sitting in chair On FM HEENT: normal  Neck: supple. JVP 8   Carotids 2+ bilat; no bruits. No lymphadenopathy or thryomegaly appreciated. Cor: LVAD hum.  Lungs: decreased at bases Abdomen: obese soft, nontender, non-distended. No hepatosplenomegaly. No bruits or masses. Good bowel sounds. Driveline site clean. Anchor in place.  Extremities: no cyanosis, clubbing, rash. Warm no edema  Neuro: alert & oriented x 3. No focal deficits. Moves all 4 without problem   Remains tenuous. Continue milrinone, IV diuresis and abx. Repeat CXR.   Likely can stop diuretics tomorrow.   VAD interrogated personally. Parameters stable.  Push ambulation.   CRITICAL CARE Performed by: Glori Bickers  Total critical care time: 35 minutes  Critical care time was exclusive of separately billable procedures and treating other patients.  Critical care was necessary to treat or prevent imminent or life-threatening deterioration.  Critical care was time spent personally by me (independent of midlevel providers or residents) on the following activities: development of treatment plan with patient and/or surrogate as well as nursing, discussions with consultants, evaluation of patient's response to treatment, examination of patient, obtaining history from patient or surrogate, ordering and performing treatments and interventions, ordering and review of laboratory studies, ordering and review of radiographic studies, pulse oximetry and re-evaluation of patient's condition.  Glori Bickers, MD   11:51 AM

## 2022-07-15 ENCOUNTER — Inpatient Hospital Stay (HOSPITAL_COMMUNITY): Payer: Medicare Other

## 2022-07-15 DIAGNOSIS — I4891 Unspecified atrial fibrillation: Secondary | ICD-10-CM | POA: Diagnosis not present

## 2022-07-15 DIAGNOSIS — Z95811 Presence of heart assist device: Secondary | ICD-10-CM | POA: Diagnosis not present

## 2022-07-15 DIAGNOSIS — R570 Cardiogenic shock: Secondary | ICD-10-CM | POA: Diagnosis not present

## 2022-07-15 DIAGNOSIS — I5023 Acute on chronic systolic (congestive) heart failure: Secondary | ICD-10-CM | POA: Diagnosis not present

## 2022-07-15 LAB — COOXEMETRY PANEL
Carboxyhemoglobin: 1.3 % (ref 0.5–1.5)
Methemoglobin: <0.7 % (ref 0.0–1.5)
O2 Saturation: 56.2 %
Total hemoglobin: 8.3 g/dL — ABNORMAL LOW (ref 12.0–16.0)

## 2022-07-15 LAB — GLUCOSE, CAPILLARY
Glucose-Capillary: 183 mg/dL — ABNORMAL HIGH (ref 70–99)
Glucose-Capillary: 215 mg/dL — ABNORMAL HIGH (ref 70–99)
Glucose-Capillary: 199 mg/dL — ABNORMAL HIGH (ref 70–99)
Glucose-Capillary: 346 mg/dL — ABNORMAL HIGH (ref 70–99)

## 2022-07-15 LAB — BASIC METABOLIC PANEL
Anion gap: 15 (ref 5–15)
BUN: 25 mg/dL — ABNORMAL HIGH (ref 6–20)
CO2: 27 mmol/L (ref 22–32)
Calcium: 8.7 mg/dL — ABNORMAL LOW (ref 8.9–10.3)
Chloride: 88 mmol/L — ABNORMAL LOW (ref 98–111)
Creatinine, Ser: 1.4 mg/dL — ABNORMAL HIGH (ref 0.61–1.24)
GFR, Estimated: 58 mL/min — ABNORMAL LOW (ref >60)
Glucose, Bld: 174 mg/dL — ABNORMAL HIGH (ref 70–99)
Potassium: 3.8 mmol/L (ref 3.5–5.1)
Sodium: 130 mmol/L — ABNORMAL LOW (ref 135–145)

## 2022-07-15 LAB — PROTIME-INR
INR: 2.2 — ABNORMAL HIGH (ref 0.8–1.2)
Prothrombin Time: 24 seconds — ABNORMAL HIGH (ref 11.4–15.2)

## 2022-07-15 LAB — CBC
HCT: 24.6 % — ABNORMAL LOW (ref 39.0–52.0)
Hemoglobin: 7.9 g/dL — ABNORMAL LOW (ref 13.0–17.0)
MCH: 27.1 pg (ref 26.0–34.0)
MCHC: 32.1 g/dL (ref 30.0–36.0)
MCV: 84.5 fL (ref 80.0–100.0)
Platelets: 517 10*3/uL — ABNORMAL HIGH (ref 150–400)
RBC: 2.91 MIL/uL — ABNORMAL LOW (ref 4.22–5.81)
RDW: 15.5 % (ref 11.5–15.5)
WBC: 15.9 10*3/uL — ABNORMAL HIGH (ref 4.0–10.5)
nRBC: 0.1 % (ref 0.0–0.2)

## 2022-07-15 LAB — MAGNESIUM: Magnesium: 2.4 mg/dL (ref 1.7–2.4)

## 2022-07-15 LAB — LACTATE DEHYDROGENASE: LDH: 271 U/L — ABNORMAL HIGH (ref 98–192)

## 2022-07-15 MED ORDER — INSULIN GLARGINE-YFGN 100 UNIT/ML ~~LOC~~ SOLN
22.0000 [IU] | Freq: Every day | SUBCUTANEOUS | Status: DC
  Administered 2022-07-15 – 2022-07-20 (×6): 22 [IU] via SUBCUTANEOUS
  Filled 2022-07-15 (×8): qty 0.22

## 2022-07-15 MED ORDER — WARFARIN SODIUM 2.5 MG PO TABS
2.5000 mg | ORAL_TABLET | Freq: Once | ORAL | Status: AC
  Administered 2022-07-15: 2.5 mg via ORAL
  Filled 2022-07-15: qty 1

## 2022-07-15 MED ORDER — TORSEMIDE 20 MG PO TABS
40.0000 mg | ORAL_TABLET | Freq: Two times a day (BID) | ORAL | Status: DC
  Administered 2022-07-16 (×2): 40 mg via ORAL
  Filled 2022-07-15 (×2): qty 2

## 2022-07-15 MED ORDER — POTASSIUM CHLORIDE 20 MEQ PO PACK
20.0000 meq | PACK | Freq: Once | ORAL | Status: AC
  Administered 2022-07-15: 20 meq
  Filled 2022-07-15: qty 1

## 2022-07-15 NOTE — Progress Notes (Signed)
Patient ID: Darryl Frye, male   DOB: [birth year], 61 y.o.   MRN: [id number] Advanced Heart Failure VAD Team Note  PCP-Cardiologist: Carlyle Dolly, MD   Subjective:    - 11/13 S/P HMIII L atrial appendage closure.  - 11/16 Extubated . GIven 40 mg IV and started on lasix drip 10 mg per/hr.  - 11/17 Developed A fib RVR. Given bolus 150 x 2. Unchanged.  - 11/18 Bedside echo 11/18 LV small. VAD cannula well aligned. AoV opening every beat. Septum pulling to left RV moderately HK. Small to moderate anterior effusion - 11/22 Ramp echo - 11/22 Nasal cautery and packing by ENT. Ramp echo. Speed increased to 5400.   CO-OX 56% on milrinone 0.125. CVP 8-9. On lasix gtt at 10/hr. Off diamox Weight up 2 pounds today Scr 1.22 -> 1.40  Sputum culture + for capnocytophaga. CCM switched to zosyn given persistent infiltrates on CXR  O2 requirement improving but still wearing FM due to nasal packing  Remains weak. Able to walk a little.   MAPs 80s (checked personally)   LVAD INTERROGATION:  HeartMate III LVAD:   Flow 4.7 liters/min, speed 5400, power 4 , PI 3.1 LDH stable 271  Objective:    Vital Signs:   Temp:  [97.5 F (36.4 C)-98.6 F (37 C)] 98.6 F (37 C) (11/25 0800) Pulse Rate:  [86-119] 87 (11/25 1000) Resp:  [12-35] 18 (11/25 1000) BP: (76-113)/(14-87) 100/87 (11/25 1000) SpO2:  [89 %-100 %] 100 % (11/25 1000) FiO2 (%):  [50 %] 50 % (11/25 0746) Weight:  [88 kg] 88 kg (11/25 0500) Last BM Date : 07/14/22 Mean arterial Pressure 80s  Intake/Output:   Intake/Output Summary (Last 24 hours) at 07/15/2022 1046 Last data filed at 07/15/2022 1000 Gross per 24 hour  Intake 987.85 ml  Output 3845 ml  Net -2857.15 ml      Physical Exam   General:  Sitting up in chair on FM. Weak appearing HEENT: normal + FM. Left nare packed Neck: supple. JVP 8-9.  Carotids 2+ bilat; no bruits. No lymphadenopathy or thryomegaly appreciated. Cor: LVAD hum.  Lungs: Clear. Dull at  bases Abdomen: obese soft, nontender, +distended. No hepatosplenomegaly. No bruits or masses. Good bowel sounds. Driveline site clean. Anchor in place.  Extremities: no cyanosis, clubbing, rash. Warm no edema  Neuro: alert & oriented x 3. No focal deficits. Moves all 4 without problem    Telemetry   Sinus 80-90 Personally reviewed   Labs   Basic Metabolic Panel: Recent Labs  Lab 07/09/22 0259 07/09/22 0558 07/10/22 0355 07/10/22 0350 07/11/22 0426 07/11/22 0617 07/12/22 0435 07/12/22 0533 07/12/22 1319 07/13/22 0504 07/14/22 0400 07/14/22 0513 07/15/22 0430  NA 136   < > 135   < > 134*   < > 130*   < > 126* 132* 133* 129* 130*  K 3.0*   < > 3.7   < > 3.9   < > 4.3   < > 4.0 3.8 3.9 4.0 3.8  CL 96*   < > 97*   < > 91*  --  88*  --  86* 87* 88*  --  88*  CO2 26   < > 27   < > 28  --  29  --  27 31 30   --  27  GLUCOSE 126*   < > 146*   < > 135*  --  223*  --  285* 96 96  --  174*  BUN 25*   < >  21*   < > 17  --  32*  --  34* 27* 25*  --  25*  CREATININE 1.18   < > 1.00   < > 1.07  --  1.21  --  1.24 1.10 1.22  --  1.40*  CALCIUM 8.2*   < > 8.5*   < > 8.7*  --  8.6*  --  8.4* 8.6* 8.7*  --  8.7*  MG 1.9  --  1.8  --  1.8  --  2.1  --   --   --   --   --  2.4  PHOS 2.1*  --  1.5*  --  2.8  --   --   --   --   --   --   --   --    < > = values in this interval not displayed.     Liver Function Tests: No results for input(s): "AST", "ALT", "ALKPHOS", "BILITOT", "PROT", "ALBUMIN" in the last 168 hours. No results for input(s): "LIPASE", "AMYLASE" in the last 168 hours. No results for input(s): "AMMONIA" in the last 168 hours.  CBC: Recent Labs  Lab 07/09/22 0259 07/09/22 0608 07/10/22 0355 07/10/22 2505 07/11/22 0426 07/11/22 0617 07/12/22 0435 07/12/22 0533 07/12/22 0810 07/13/22 0504 07/14/22 0513 07/14/22 0942 07/15/22 0430  WBC 11.5*  --  11.0*  --  15.1*  --  16.4*  --  16.9* 15.5*  --  16.6* 15.9*  NEUTROABS 9.3*  --  8.4*  --  11.9*  --   --   --   --    --   --   --   --   HGB 9.4*   < > 9.4*   < > 9.3*   < > 8.3*   < > 8.6* 8.0* 8.8* 8.0* 7.9*  HCT 26.2*   < > 28.0*   < > 27.2*   < > 25.3*   < > 24.8* 23.5* 26.0* 23.9* 24.6*  MCV 80.9  --  82.1  --  80.5  --  83.8  --  81.6 81.3  --  82.7 84.5  PLT 211  --  271  --  306  --  371  --  380 395  --  462* 517*   < > = values in this interval not displayed.     INR: Recent Labs  Lab 07/11/22 0426 07/12/22 0435 07/13/22 0504 07/14/22 0400 07/15/22 0430  INR 2.2* 2.2* 2.0* 2.0* 2.2*   :    Imaging   DG Chest Port 1 View  Result Date: 07/15/2022 CLINICAL DATA:  Left ventricular assist device. EXAM: PORTABLE CHEST 1 VIEW COMPARISON:  July 14, 2022. FINDINGS: Stable cardiomegaly with stable position of left ventricular assist device. Left-sided pacemaker is unchanged. Right-sided PICC line is unchanged. Stable bilateral lung opacities are noted. Minimal pleural effusions are noted. Bony thorax is unremarkable. IMPRESSION: Stable support apparatus.  Stable bilateral lung opacities. Electronically Signed   By: Marijo Conception M.D.   On: 07/15/2022 08:19   DG Chest Port 1 View  Result Date: 07/14/2022 CLINICAL DATA:  Acute on chronic systolic CHF. EXAM: PORTABLE CHEST 1 VIEW COMPARISON:  07/12/2022 FINDINGS: Patient has LEFT-sided transvenous pacemaker with leads to the RIGHT atrium and RIGHT ventricle. Patient has LEFT-sided ventricular assist device. RIGHT IJ she is no longer present, either withdrawn or removed. RIGHT-sided PICC line tip overlies the superior vena cava. Prior median sternotomy. Heart is enlarged. There has been improvement  in aeration of both lung bases though patchy opacities persist. Visualized portion of the abdomen is unremarkable. IMPRESSION: 1. Improved aeration of the lung bases. Persistent bilateral opacities. 2. Stable cardiomegaly. Electronically Signed   By: Nolon Nations M.D.   On: 07/14/2022 12:52     Medications:     Scheduled Medications:  sodium  chloride   Intravenous Once   amiodarone  200 mg Oral BID   aspirin  81 mg Oral Daily   atorvastatin  20 mg Oral Daily   bisacodyl  10 mg Oral Daily   Or   bisacodyl  10 mg Rectal Daily   Chlorhexidine Gluconate Cloth  6 each Topical Daily   digoxin  0.125 mg Oral Daily   Fe Fum-Vit C-Vit B12-FA  1 capsule Oral QPC breakfast   feeding supplement  237 mL Oral TID BM   [START ON 07/19/2022] influenza vac split quadrivalent PF  0.5 mL Intramuscular Tomorrow-1000   insulin aspart  0-24 Units Subcutaneous TID WC & HS   insulin glargine-yfgn  22 Units Subcutaneous QHS   insulin glargine-yfgn  25 Units Subcutaneous Daily   ipratropium-albuterol  3 mL Nebulization TID   levothyroxine  25 mcg Oral Q0600   lidocaine  2 patch Transdermal Q24H   magnesium oxide  400 mg Oral BID   melatonin  3 mg Oral QHS   multivitamin with minerals  1 tablet Oral Daily   pantoprazole  40 mg Oral Daily   [START ON 07/19/2022] pneumococcal 20-valent conjugate vaccine  0.5 mL Intramuscular Tomorrow-1000   polyethylene glycol  17 g Oral BID   senna-docusate  1 tablet Oral BID   sertraline  50 mg Oral Daily   sildenafil  20 mg Oral TID   sodium chloride flush  10-40 mL Intracatheter Q12H   sodium chloride flush  10-40 mL Intracatheter Q12H   sodium chloride flush  3 mL Intravenous Q12H   sodium chloride flush  3 mL Intravenous Q12H   spironolactone  25 mg Oral Daily   warfarin  2.5 mg Oral ONCE-1600   Warfarin - Physician Dosing Inpatient   Does not apply q1600    Infusions:  sodium chloride Stopped (07/07/22 1346)   sodium chloride     sodium chloride Stopped (07/12/22 1638)   furosemide (LASIX) 200 mg in dextrose 5 % 100 mL (2 mg/mL) infusion 10 mg/hr (07/15/22 0600)   lactated ringers     lactated ringers Stopped (07/04/22 2207)   lactated ringers Stopped (07/14/22 0801)   milrinone 0.125 mcg/kg/min (07/15/22 0600)   piperacillin-tazobactam (ZOSYN)  IV 12.5 mL/hr at 07/15/22 0600    PRN  Medications: sodium chloride, acetaminophen, ALPRAZolam, dextrose, hydrALAZINE, lactated ringers, morphine injection, ondansetron (ZOFRAN) IV, ondansetron (ZOFRAN) IV, mouth rinse, oxyCODONE, sodium chloride flush, sodium chloride flush, sodium chloride flush, traMADol, tranexamic acid   Patient Profile   Admitted with A/C HFrEF --> cardiogenic shock. S/P HMIII LVAD  Assessment/Plan:    1. Acute on chronic systolic CHF/cardiogenic shock: Nonischemic cardiomyopathy, diagnosed 2020.  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  Cath in 12/20 with no significant coronary disease.  [employer] ICD. Most recent echo in 2/23 showed EF 20-25% with normal RV.  Recently, patient has been symptomatically worse, NYHA class IV with profound orthopnea. RHC showed markedly elevated filling pressures, primarily pulmonary venous hypertension,  low cardiac output, and low PAPI.  Patient was admitted for inotrope and diuresis. Echo this admission with EF <20%, mod RV dysfunction.  He was started on milrinone with improvement in hemodynamics.  We proceeded with HM3 LVAD with LAA clip on 11/14.  He is currently on milrinone 0.125 and sildenafil 20 tid.  Co-ox 56%. CVP 8-9. SCr up slightly - Continue milrinone 0.125 - Stop lasix gtt. Start torsemide 40 bid tomorrow - Continue sildenafil 20 tid.  - Continue 25 mg spiro daily.  - Continue digoxin 0.125 daily.  2. A fib RVR, post-op : H/O S/p flutter ablation.  Remains in NSR.  - Continue po amiodarone 200 BID - Continue warfarin.  3. VAD management: s/p HM-3 VAD on 11/14.  Bedside echo 11/18 LV small, VAD cannula well aligned, AoV opening every beat, septum pulling to left RV moderately HK, small to moderate anterior effusion. Ramp echo 11/22 RV mild to mod HK. Speed turned to 5400 LDH 271. MAP  80s - Now on warfarin with INR 2.2 - ASA 81. -VAD interrogated personally. Parameters stable. 4. H/o DVT: On anticoagulation.  5. Acute hypoxemic respiratory failure, post-op. 11/16 extubated.  Bilateral lower lobe infiltrates on CXR. CVP trending down but CXR has not improved, still with bilateral infiltrates and significant oxygen requirement.  He has been on amiodarone this admission for atrial fibrillation, was not on amiodarone before this hospitalization.  Will need to think about amiodarone lung toxicity, discussed with Dr Tacy Learn who does not think particularly likely but will need to monitor for improvement.  It is also possible that he aspirated.  Sputum culture + for capnocytophaga. CCM switched to zosyn given persistent infiltrates on CXR.CT chest w/o contrast, discussed with Dr. Tenny Craw. Diffuse interstitial and patchy alveolar opacities. CXR unchanged today - Suspect he has a lot of atx - I did IS with him today and initially only 250-500cc but got up to 1,000. Will do IS hourl - Encourage mobility.  - D/w CCM at bedside 6. AKI: Creatinine stable at 1.2 -> 1.4 - Diuretic management as above 7. ID: Afebrile, AM CBC pending. He has completed empiric abx for ?post-op PNA. Sputum culture + for capnocytophaga. CCM switched to zosyn given persistent infiltrates on imaging 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  9. Hypokalemia - Continue spironolactone.  10. Epistaxis: s/p cauterization/packing 11/22 - stable - remove packing Monday 11. Anemia: Hgb 7.9 - Transfuse hgb < 7.5 at this point.  10. Deconditioning - PT/OT following  - CIR  Glori Bickers MD 07/15/2022 10:46 AM

## 2022-07-15 NOTE — Progress Notes (Signed)
[address]       [place] [address]             [phone]      11 Days Post-Op  Procedure(s) (LRB): INSERTION OF IMPLANTABLE LEFT VENTRICULAR ASSIST DEVICE; ATRICLIP 45 (N/A) TRANSESOPHAGEAL ECHOCARDIOGRAM (TEE) (N/A)  Total Length of Stay:  LOS: 18 days   SUBJECTIVE: Has only ambulated the smaller loop this am Eating well Raising sputum with coughing  Vitals:   07/15/22 0300 07/15/22 0500  BP: (!) 84/61 91/75  Pulse: 86 (!) 119  Resp: 16 (!) 24  Temp:    SpO2: 100% 100%    Intake/Output      11/24 0701 11/25 0700 11/25 0701 11/26 0700   P.O. 720    I.V. (mL/kg) 194.4 (2.2)    IV Piggyback 139.9    Total Intake(mL/kg) 1054.3 (12)    Urine (mL/kg/hr) 3845 (1.8)    Stool     Total Output 3845    Net -2790.7             sodium chloride Stopped (07/07/22 1346)   sodium chloride     sodium chloride Stopped (07/12/22 1638)   furosemide (LASIX) 200 mg in dextrose 5 % 100 mL (2 mg/mL) infusion 10 mg/hr (07/15/22 0600)   lactated ringers     lactated ringers Stopped (07/04/22 2207)   lactated ringers Stopped (07/14/22 0801)   milrinone 0.125 mcg/kg/min (07/15/22 0600)   piperacillin-tazobactam (ZOSYN)  IV 12.5 mL/hr at 07/15/22 0600    CBC    Component Value Date/Time   WBC 15.9 (H) 07/15/2022 0430   RBC 2.91 (L) 07/15/2022 0430   HGB 7.9 (L) 07/15/2022 0430   HCT 24.6 (L) 07/15/2022 0430   PLT 517 (H) 07/15/2022 0430   MCV 84.5 07/15/2022 0430   MCH 27.1 07/15/2022 0430   MCHC 32.1 07/15/2022 0430   RDW 15.5 07/15/2022 0430   LYMPHSABS 0.8 07/11/2022 0426   MONOABS 1.6 (H) 07/11/2022 0426   EOSABS 0.5 07/11/2022 0426   BASOSABS 0.1 07/11/2022 0426   CMP     Component Value Date/Time   NA 130 (L) 07/15/2022 0430   NA 135 11/23/2021 0814   K 3.8 07/15/2022 0430   CL 88 (L) 07/15/2022 0430   CO2 27 07/15/2022 0430   GLUCOSE 174 (H) 07/15/2022 0430   BUN 25 (H) 07/15/2022 0430   BUN 20 11/23/2021 0814   CREATININE  1.40 (H) 07/15/2022 0430   CALCIUM 8.7 (L) 07/15/2022 0430   PROT 6.6 07/07/2022 0354   ALBUMIN 2.9 (L) 07/07/2022 0354   AST 57 (H) 07/07/2022 0354   ALT 21 07/07/2022 0354   ALKPHOS 89 07/07/2022 0354   BILITOT 0.7 07/07/2022 0354   GFRNONAA 58 (L) 07/15/2022 0430   GFRAA >60 04/21/2020 1659   ABG    Component Value Date/Time   PHART 7.557 (H) 07/14/2022 0513   PCO2ART 37.1 07/14/2022 0513   PO2ART 113 (H) 07/14/2022 0513   HCO3 33.1 (H) 07/14/2022 0513   TCO2 34 (H) 07/14/2022 0513   ACIDBASEDEF 2.0 07/07/2022 0510   O2SAT 56.2 07/15/2022 0431   CBG (last 3)  Recent Labs    07/14/22 1641 07/14/22 2134 07/15/22 0705  GLUCAP 210* 200* 183*  EXAM Lungs: crackles and some rhonchi bilaterally Card: LVAD noise Ext: warm, trace edema   ASSESSMENT: POD #11 SP LVAD Coox 56 continues on milrinone Cr slowly rising  Negative 2800 cc yesterday. Consider decreasing lasix  drip On antibiotics for presumbed pneumonia. CXR with some patchy consolidations bilaterally Continue to ambulate Coumadin today   Coralie Common, MD 07/15/2022

## 2022-07-15 NOTE — Progress Notes (Signed)
NAME:  Darryl Frye, MRN:  [id number], DOB:  12/25/1960, LOS: 66 ADMISSION DATE:  06/27/2022, CONSULTATION DATE:  11/14 REFERRING MD:  Aundra Dubin, CHIEF COMPLAINT:  critical care support s/p LVAD   History of Present Illness:  61 year old male w/ sig h/o NICM (EF 20-25%), af/flutter. Presented 11/14 for LVAD after failing out-pt medical therapies. Initial flow 4.5 l/min speed 5100 power 3.6 P1 2.3  Pertinent  Medical History  Afib/flutter s/p ablation May 2023 NICM EF 20-25% has ICD Remote h/o heavy ETOH and cocaine Class IV NYHF sxs.   Significant Hospital Events: Including procedures, antibiotic start and stop dates in addition to other pertinent events   11/14 LVAD placed. Returned to ICU full vent support. On iNO 20ppm, Returned to ICU post-op intubated on Neo, epi 6, and amio gtt. Initial hemodynamics PA 34/20 CO 4.9 CI 2.4 co ox 70% l CVP 10-. On full vent support. PCCM asked to assist w/ care  11/15 iNO weaned off. Got lasix. Pump speed increased to 5200. Still on epi and milrione. Awake following commands 11/15 attempted SBT but saturations dropped 11/16 extubated  11/17 mobilizations, remains on pressors   Interim History / Subjective:  Patient stated feeling more exhausted today Is increasing shortness of breath Remains on Amio, milrinone and Lasix infusion We cannot get accurate blood pressure monitoring via cuff Diuresed well with net -2.8 L  Objective   Blood pressure 91/75, pulse (!) 119, temperature 98.6 F (37 C), temperature source Oral, resp. rate (!) 24, height 5\' 11"  (1.803 m), weight 88 kg, SpO2 100 %. CVP:  [9 mmHg-14 mmHg] 10 mmHg  FiO2 (%):  [50 %] 50 %   Intake/Output Summary (Last 24 hours) at 07/15/2022 0830 Last data filed at 07/15/2022 0600 Gross per 24 hour  Intake 785.8 ml  Output 3695 ml  Net -2909.2 ml   Filed Weights   07/12/22 0500 07/14/22 0500 07/15/22 0500  Weight: 89.4 kg 87.1 kg 88 kg   Examination: General: Middle-age male, sitting  on the bed on Ventimask HENT: NCAT, tracking appropriately Heart: Hum from LVAD, midline sternal incision Pulm: Bilateral basal crackles, right more than left, no rhonchi or wheezes Abd: Soft nontender nondistended Ext: Warm dry no edema Neuro: Alert, awake, following commands Skin: No rash  Assessment & Plan:  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 s/p LVAD HM3 Continue milrinone at 0.125 per heart failure service Continue sildenafil and digoxin Continue amiodarone Currently on Lasix infusion at 10 mg/h Remains net -2.8 L, despite that Coox is 54% is dropping Cannot get accurate blood pressure via cuff, will place arterial line LDH is downtrending now 271  Left nare epistaxis Acute Hypoxemic Respiratory Failure  Bilateral multifocal pneumonia Metabolic alkalosis Patient had left nostril bleeding few days ago due to dryness from high flow nasal cannula oxygen Left nostril was packed by ENT, packing will be removed on Monday His INR is therapeutic at 2.2 Currently on Ventimask No more bleeding from left nostril now CT chest was done showed bilateral patchy multifocal pneumonia Respiratory culture is growing Capnocytophaga Continue IV Zosyn to complete 7 days therapy X-ray chest, started looking better compared to few days ago Titrate with O2 sat goal 92% Continue PT/OT  Continue incentive spirometry and flutter valve Due to aggressive diuresis patient is going into metabolic alkalosis, patient received 2 doses of acetazolamide yesterday with improvement in bicarbonate down from 31 to27  H/o afib/flutter s/p prior ablation Currently in normal sinus rhythm On  Amiodarone and digoxin Continue Coumadin, INR is 2.2  H/o DVT Continue Coumadin, INR is therapeutic Appreciate pharmacy to see help.  Hypervolemic hyponatremia Serum sodium remained at 130 Closely monitor  Hypokalemia/hypophosphatemia/hypocalcemia, resolved  Acute kidney injury likely  due to cardiorenal syndrome and now with aggressive diuresis Also it could be related to ischemic ATN from cardiogenic shock Serum creatinine continue to rise now at 1.4 Monitor intake and output Avoid nephrotoxic agents  Best Practice (right click and "Reselect all SmartList Selections" daily)   Diet/type: 2 g sodium diet DVT prophylaxis: Coumadin GI prophylaxis: Protonix Lines: Central line Foley:  Yes, and it is still needed Code Status:  limited Last date of multidisciplinary goals of care discussion [Per primary team]   Total critical care time: 34 minutes  Performed by: Jacky Kindle   Critical care time was exclusive of separately billable procedures and treating other patients.   Critical care was necessary to treat or prevent imminent or life-threatening deterioration.   Critical care was time spent personally by me on the following activities: development of treatment plan with patient and/or surrogate as well as nursing, discussions with consultants, evaluation of patient's response to treatment, examination of patient, obtaining history from patient or surrogate, ordering and performing treatments and interventions, ordering and review of laboratory studies, ordering and review of radiographic studies, pulse oximetry and re-evaluation of patient's condition.   Jacky Kindle, MD [facility] See Amion for pager If no response to pager, please call [phone] until 7pm After 7pm, Please call E-link [phone]

## 2022-07-15 NOTE — Progress Notes (Signed)
     [address]       [place] [address]             [phone]       EVENING ROUNDS  Continues weakness and oxygen needs Off lasix drip with good urine output, negative 300cc

## 2022-07-16 DIAGNOSIS — Z95811 Presence of heart assist device: Secondary | ICD-10-CM | POA: Diagnosis not present

## 2022-07-16 DIAGNOSIS — R570 Cardiogenic shock: Secondary | ICD-10-CM | POA: Diagnosis not present

## 2022-07-16 DIAGNOSIS — I50811 Acute right heart failure: Secondary | ICD-10-CM | POA: Diagnosis not present

## 2022-07-16 DIAGNOSIS — I5023 Acute on chronic systolic (congestive) heart failure: Secondary | ICD-10-CM | POA: Diagnosis not present

## 2022-07-16 LAB — CBC
HCT: 24.6 % — ABNORMAL LOW (ref 39.0–52.0)
Hemoglobin: 7.8 g/dL — ABNORMAL LOW (ref 13.0–17.0)
MCH: 27.2 pg (ref 26.0–34.0)
MCHC: 31.7 g/dL (ref 30.0–36.0)
MCV: 85.7 fL (ref 80.0–100.0)
Platelets: 534 10*3/uL — ABNORMAL HIGH (ref 150–400)
RBC: 2.87 MIL/uL — ABNORMAL LOW (ref 4.22–5.81)
RDW: 15.6 % — ABNORMAL HIGH (ref 11.5–15.5)
WBC: 15.1 10*3/uL — ABNORMAL HIGH (ref 4.0–10.5)
nRBC: 0.2 % (ref 0.0–0.2)

## 2022-07-16 LAB — LACTATE DEHYDROGENASE: LDH: 258 U/L — ABNORMAL HIGH (ref 98–192)

## 2022-07-16 LAB — GLUCOSE, CAPILLARY
Glucose-Capillary: 127 mg/dL — ABNORMAL HIGH (ref 70–99)
Glucose-Capillary: 201 mg/dL — ABNORMAL HIGH (ref 70–99)
Glucose-Capillary: 241 mg/dL — ABNORMAL HIGH (ref 70–99)
Glucose-Capillary: 158 mg/dL — ABNORMAL HIGH (ref 70–99)

## 2022-07-16 LAB — PROTIME-INR
INR: 2.3 — ABNORMAL HIGH (ref 0.8–1.2)
Prothrombin Time: 24.7 seconds — ABNORMAL HIGH (ref 11.4–15.2)

## 2022-07-16 LAB — COOXEMETRY PANEL
Carboxyhemoglobin: 1.8 % — ABNORMAL HIGH (ref 0.5–1.5)
Methemoglobin: 1.4 % (ref 0.0–1.5)
O2 Saturation: 63.1 %
Total hemoglobin: 8.4 g/dL — ABNORMAL LOW (ref 12.0–16.0)

## 2022-07-16 LAB — BASIC METABOLIC PANEL
Anion gap: 15 (ref 5–15)
BUN: 23 mg/dL — ABNORMAL HIGH (ref 6–20)
CO2: 26 mmol/L (ref 22–32)
Calcium: 8.8 mg/dL — ABNORMAL LOW (ref 8.9–10.3)
Chloride: 88 mmol/L — ABNORMAL LOW (ref 98–111)
Creatinine, Ser: 1.22 mg/dL (ref 0.61–1.24)
GFR, Estimated: >60 mL/min (ref >60)
Glucose, Bld: 149 mg/dL — ABNORMAL HIGH (ref 70–99)
Potassium: 3.7 mmol/L (ref 3.5–5.1)
Sodium: 129 mmol/L — ABNORMAL LOW (ref 135–145)

## 2022-07-16 MED ORDER — WARFARIN SODIUM 2.5 MG PO TABS
2.5000 mg | ORAL_TABLET | Freq: Once | ORAL | Status: AC
  Administered 2022-07-16: 2.5 mg via ORAL
  Filled 2022-07-16: qty 1

## 2022-07-16 MED ORDER — POTASSIUM CHLORIDE CRYS ER 20 MEQ PO TBCR
20.0000 meq | EXTENDED_RELEASE_TABLET | ORAL | Status: AC
  Administered 2022-07-16 (×3): 20 meq via ORAL

## 2022-07-16 NOTE — Progress Notes (Signed)
Patient ID: Darryl Frye, male   DOB: [birth year], 61 y.o.   MRN: [id number] Advanced Heart Failure VAD Team Note  PCP-Cardiologist: Carlyle Dolly, MD   Subjective:    - 11/13 S/P HMIII L atrial appendage closure.  - 11/16 Extubated . GIven 40 mg IV and started on lasix drip 10 mg per/hr.  - 11/17 Developed A fib RVR. Given bolus 150 x 2. Unchanged.  - 11/18 Bedside echo 11/18 LV small. VAD cannula well aligned. AoV opening every beat. Septum pulling to left RV moderately HK. Small to moderate anterior effusion - 11/22 Ramp echo - 11/22 Nasal cautery and packing by ENT. Ramp echo. Speed increased to 5400.  CT chest suggestive of patchy multifocal PNA and atelectasis.   CO-OX 53% on milrinone 0.125. CVP 9. Off Lasix gtt and now on torsemide 40 bid. Weight stable, creatinine 1.22.   Sputum culture + for capnocytophaga. CCM switched to zosyn given persistent infiltrates on CXR.   O2 requirement improving but still wearing FM due to nasal packing  Remains weak. Walked about 160 ft this morning.   MAPs 70s-80s    LVAD INTERROGATION:  HeartMate III LVAD:   Flow 4.7 liters/min, speed 5400, power 4 , PI 3.2.  No PI events LDH stable 271 => 258  Objective:    Vital Signs:   Temp:  [97.9 F (36.6 C)-98.6 F (37 C)] 97.9 F (36.6 C) (11/25 2320) Pulse Rate:  [65-123] 123 (11/26 0700) Resp:  [13-27] 26 (11/26 0700) BP: (76-119)/(14-94) 107/94 (11/26 0600) SpO2:  [80 %-100 %] 92 % (11/26 0700) FiO2 (%):  [50 %] 50 % (11/25 2041) Weight:  [88.2 kg] 88.2 kg (11/26 0500) Last BM Date : 07/15/22 Mean arterial Pressure 70s-80s  Intake/Output:   Intake/Output Summary (Last 24 hours) at 07/16/2022 0729 Last data filed at 07/16/2022 0701 Gross per 24 hour  Intake 1268.57 ml  Output 2375 ml  Net -1106.43 ml     Physical Exam   General: Wearing facemask HEENT: Normal. Neck: Supple, JVP 8-9 cm. Carotids OK. Packing in nostril Cardiac:  Mechanical heart sounds with LVAD hum  present.  Lungs:  CTAB, normal effort.  Abdomen:  NT, ND, no HSM. No bruits or masses. +BS  LVAD exit site: Well-healed and incorporated. Dressing dry and intact. No erythema or drainage. Stabilization device present and accurately applied. Driveline dressing changed daily per sterile technique. Extremities:  Warm and dry. No cyanosis, clubbing, rash, or edema.  Neuro:  Alert & oriented x 3. Cranial nerves grossly intact. Moves all 4 extremities w/o difficulty. Affect pleasant     Telemetry   Sinus 80-90 Personally reviewed   Labs   Basic Metabolic Panel: Recent Labs  Lab 07/10/22 0355 07/10/22 4970 07/11/22 0426 07/11/22 0617 07/12/22 0435 07/12/22 0533 07/12/22 1319 07/13/22 0504 07/14/22 0400 07/14/22 0513 07/15/22 0430 07/16/22 0555  NA 135   < > 134*   < > 130*   < > 126* 132* 133* 129* 130* 129*  K 3.7   < > 3.9   < > 4.3   < > 4.0 3.8 3.9 4.0 3.8 3.7  CL 97*   < > 91*  --  88*  --  86* 87* 88*  --  88* 88*  CO2 27   < > 28  --  29  --  27 31 30   --  27 26  GLUCOSE 146*   < > 135*  --  223*  --  285* 96 96  --  174* 149*  BUN 21*   < > 17  --  32*  --  34* 27* 25*  --  25* 23*  CREATININE 1.00   < > 1.07  --  1.21  --  1.24 1.10 1.22  --  1.40* 1.22  CALCIUM 8.5*   < > 8.7*  --  8.6*  --  8.4* 8.6* 8.7*  --  8.7* 8.8*  MG 1.8  --  1.8  --  2.1  --   --   --   --   --  2.4  --   PHOS 1.5*  --  2.8  --   --   --   --   --   --   --   --   --    < > = values in this interval not displayed.    Liver Function Tests: No results for input(s): "AST", "ALT", "ALKPHOS", "BILITOT", "PROT", "ALBUMIN" in the last 168 hours. No results for input(s): "LIPASE", "AMYLASE" in the last 168 hours. No results for input(s): "AMMONIA" in the last 168 hours.  CBC: Recent Labs  Lab 07/10/22 0355 07/10/22 2637 07/11/22 0426 07/11/22 0617 07/12/22 0810 07/13/22 0504 07/14/22 0513 07/14/22 0942 07/15/22 0430 07/16/22 0555  WBC 11.0*  --  15.1*   < > 16.9* 15.5*  --  16.6* 15.9*  15.1*  NEUTROABS 8.4*  --  11.9*  --   --   --   --   --   --   --   HGB 9.4*   < > 9.3*   < > 8.6* 8.0* 8.8* 8.0* 7.9* 7.8*  HCT 28.0*   < > 27.2*   < > 24.8* 23.5* 26.0* 23.9* 24.6* 24.6*  MCV 82.1  --  80.5   < > 81.6 81.3  --  82.7 84.5 85.7  PLT 271  --  306   < > 380 395  --  462* 517* 534*   < > = values in this interval not displayed.    INR: Recent Labs  Lab 07/12/22 0435 07/13/22 0504 07/14/22 0400 07/15/22 0430 07/16/22 0555  INR 2.2* 2.0* 2.0* 2.2* 2.3*  :    Imaging   DG Chest Port 1 View  Result Date: 07/15/2022 CLINICAL DATA:  Left ventricular assist device. EXAM: PORTABLE CHEST 1 VIEW COMPARISON:  July 14, 2022. FINDINGS: Stable cardiomegaly with stable position of left ventricular assist device. Left-sided pacemaker is unchanged. Right-sided PICC line is unchanged. Stable bilateral lung opacities are noted. Minimal pleural effusions are noted. Bony thorax is unremarkable. IMPRESSION: Stable support apparatus.  Stable bilateral lung opacities. Electronically Signed   By: Marijo Conception M.D.   On: 07/15/2022 08:19   DG Chest Port 1 View  Result Date: 07/14/2022 CLINICAL DATA:  Acute on chronic systolic CHF. EXAM: PORTABLE CHEST 1 VIEW COMPARISON:  07/12/2022 FINDINGS: Patient has LEFT-sided transvenous pacemaker with leads to the RIGHT atrium and RIGHT ventricle. Patient has LEFT-sided ventricular assist device. RIGHT IJ she is no longer present, either withdrawn or removed. RIGHT-sided PICC line tip overlies the superior vena cava. Prior median sternotomy. Heart is enlarged. There has been improvement in aeration of both lung bases though patchy opacities persist. Visualized portion of the abdomen is unremarkable. IMPRESSION: 1. Improved aeration of the lung bases. Persistent bilateral opacities. 2. Stable cardiomegaly. Electronically Signed   By: Nolon Nations M.D.   On: 07/14/2022 12:52     Medications:     Scheduled Medications:  sodium chloride    Intravenous Once   amiodarone  200 mg Oral BID   aspirin  81 mg Oral Daily   atorvastatin  20 mg Oral Daily   bisacodyl  10 mg Oral Daily   Or   bisacodyl  10 mg Rectal Daily   Chlorhexidine Gluconate Cloth  6 each Topical Daily   digoxin  0.125 mg Oral Daily   Fe Fum-Vit C-Vit B12-FA  1 capsule Oral QPC breakfast   feeding supplement  237 mL Oral TID BM   [START ON 07/19/2022] influenza vac split quadrivalent PF  0.5 mL Intramuscular Tomorrow-1000   insulin aspart  0-24 Units Subcutaneous TID WC & HS   insulin glargine-yfgn  22 Units Subcutaneous QHS   insulin glargine-yfgn  25 Units Subcutaneous Daily   ipratropium-albuterol  3 mL Nebulization TID   levothyroxine  25 mcg Oral Q0600   lidocaine  2 patch Transdermal Q24H   magnesium oxide  400 mg Oral BID   melatonin  3 mg Oral QHS   multivitamin with minerals  1 tablet Oral Daily   pantoprazole  40 mg Oral Daily   [START ON 07/19/2022] pneumococcal 20-valent conjugate vaccine  0.5 mL Intramuscular Tomorrow-1000   polyethylene glycol  17 g Oral BID   potassium chloride  20 mEq Oral Q4H   senna-docusate  1 tablet Oral BID   sertraline  50 mg Oral Daily   sildenafil  20 mg Oral TID   sodium chloride flush  10-40 mL Intracatheter Q12H   sodium chloride flush  10-40 mL Intracatheter Q12H   sodium chloride flush  3 mL Intravenous Q12H   sodium chloride flush  3 mL Intravenous Q12H   spironolactone  25 mg Oral Daily   torsemide  40 mg Oral BID   Warfarin - Physician Dosing Inpatient   Does not apply q1600    Infusions:  sodium chloride Stopped (07/07/22 1346)   sodium chloride     sodium chloride Stopped (07/16/22 0658)   lactated ringers     lactated ringers Stopped (07/04/22 2207)   lactated ringers Stopped (07/14/22 0801)   piperacillin-tazobactam (ZOSYN)  IV 12.5 mL/hr at 07/16/22 0704    PRN Medications: sodium chloride, acetaminophen, ALPRAZolam, dextrose, hydrALAZINE, lactated ringers, morphine injection, ondansetron  (ZOFRAN) IV, ondansetron (ZOFRAN) IV, mouth rinse, oxyCODONE, sodium chloride flush, sodium chloride flush, sodium chloride flush, traMADol, tranexamic acid   Patient Profile   Admitted with A/C HFrEF --> cardiogenic shock. S/P HMIII LVAD  Assessment/Plan:    1. Acute on chronic systolic CHF/cardiogenic shock: Nonischemic cardiomyopathy, diagnosed 2020.  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  Cath in 12/20 with no significant coronary disease.  [employer] ICD. Most recent echo in 2/23 showed EF 20-25% with normal RV.  Recently, patient has been symptomatically worse, NYHA class IV with profound orthopnea. RHC showed markedly elevated filling pressures, primarily pulmonary venous hypertension, low cardiac output, and low PAPI.  Patient was admitted for inotrope and diuresis. Echo this admission with EF <20%, mod RV dysfunction.  He was started on milrinone with improvement in hemodynamics.  We proceeded with HM3 LVAD with LAA clip on 11/14.  He is currently on milrinone 0.125 and sildenafil 20 tid.  Co-ox 63%. CVP 9. Creatinine lower. Good flows on LVAD.  - Stop milrinone today.  - Continue  torsemide 40 bid  - Continue sildenafil 20 tid.  - Continue 25 mg spiro daily.  - Continue digoxin 0.125 daily.  2. A fib RVR, post-op : H/O S/p flutter ablation.  Remains in NSR.  - Continue po amiodarone 200 BID - Continue warfarin.  3. VAD management: s/p HM-3 VAD on 11/14.  Bedside echo 11/18 LV small, VAD cannula well aligned, AoV opening every beat, septum pulling to left RV moderately HK, small to moderate anterior effusion. Ramp echo 11/22 RV mild to mod HK. Speed turned to 5400 LDH 258. MAP 80s - Now on warfarin with INR 2.2 - ASA 81. -VAD interrogated personally. Parameters stable. 4. H/o DVT: On anticoagulation.  5.  Acute hypoxemic respiratory failure, post-op. 11/16 extubated.  Bilateral lower lobe infiltrates on CXR. CVP trending down but CXR has not improved, still with bilateral infiltrates and significant oxygen requirement.  He has been on amiodarone this admission for atrial fibrillation, was not on amiodarone before this hospitalization.  Will need to think about amiodarone lung toxicity, discussed with Dr Tacy Learn who does not think particularly likely but will need to monitor for improvement.  It is also possible that he aspirated.  Sputum culture + for capnocytophaga. CCM switched to zosyn given persistent infiltrates on CXR.CT chest w/o contrast. CT chest 11/22 with diffuse interstitial and patchy alveolar opacities.  - Encourage hourly incentive spirometry. - Encourage mobility.  - Continue Zosyn x 1 week.  6. AKI: Creatinine stable at 1.2 -> 1.4 -> 1.22 - Diuretic management as above 7. ID: Afebrile, WBCs 15. He completed initial vanc/cefepime empiric abx for ?post-op PNA. 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 CCM switched to zosyn given persistent infiltrates on imaging - Zosyn x 1 week.  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  9. Hypokalemia - Continue spironolactone.  10. Epistaxis: s/p cauterization/packing 11/22 - remove packing Monday 11. Anemia: Hgb 7.9 => 7.8 - Transfuse hgb < 7.5 at this point.  10. Deconditioning - PT/OT following  - CIR  Loralie Champagne MD 07/16/2022 7:29 AM

## 2022-07-16 NOTE — Progress Notes (Signed)
Drive line dressing change:   LVAD driveline dressing was changed using proper sterile technique. Old dressing removed. Driveline site cleaned using chloroprep x2. Silver strip applied. New gauze dressing applied using sterile technique. Driveline secured at driveline site with 2 sutures. Driveline site clean, no active drainage from site, slight redness, no swelling, no tenderness, no odor. Driveline anchor intact and applied correctly.   DRESSING CHANGE EVERY OTHER DAY PER LVAD COORDINATOR. NEXT DRESSING CHANGE DUE 07/18/2022.   Alma Friendly RN

## 2022-07-16 NOTE — Progress Notes (Signed)
      [address]       [place] [id number]             [phone]      12 Days Post-Op  Procedure(s) (LRB): INSERTION OF IMPLANTABLE LEFT VENTRICULAR ASSIST DEVICE; ATRICLIP 45 (N/A) TRANSESOPHAGEAL ECHOCARDIOGRAM (TEE) (N/A)  Total Length of Stay:  LOS: 19 days   SUBJECTIVE: Still feels no improvement in Dyspnea Did ambulate 170ft this am Tolerating diet Vitals:   07/16/22 0747 07/16/22 0757  BP:  93/63  Pulse:  (!) 102  Resp:  (!) 28  Temp:    SpO2: 99% 90%    Intake/Output      11/25 0701 11/26 0700 11/26 0701 11/27 0700   P.O. 937    I.V. (mL/kg) 181.5 (2.1) 3.5 (0)   IV Piggyback 146 0.5   Total Intake(mL/kg) 1264.6 (14.3) 4 (0)   Urine (mL/kg/hr) 2375 (1.1)    Total Output 2375    Net -1110.4 +4            sodium chloride Stopped (07/07/22 1346)   sodium chloride     sodium chloride Stopped (07/16/22 0658)   lactated ringers     lactated ringers Stopped (07/04/22 2207)   lactated ringers Stopped (07/14/22 0801)   piperacillin-tazobactam (ZOSYN)  IV 12.5 mL/hr at 07/16/22 0704    CBC    Component Value Date/Time   WBC 15.1 (H) 07/16/2022 0555   RBC 2.87 (L) 07/16/2022 0555   HGB 7.8 (L) 07/16/2022 0555   HCT 24.6 (L) 07/16/2022 0555   PLT 534 (H) 07/16/2022 0555   MCV 85.7 07/16/2022 0555   MCH 27.2 07/16/2022 0555   MCHC 31.7 07/16/2022 0555   RDW 15.6 (H) 07/16/2022 0555   LYMPHSABS 0.8 07/11/2022 0426   MONOABS 1.6 (H) 07/11/2022 0426   EOSABS 0.5 07/11/2022 0426   BASOSABS 0.1 07/11/2022 0426   CMP     Component Value Date/Time   NA 129 (L) 07/16/2022 0555   NA 135 11/23/2021 0814   K 3.7 07/16/2022 0555   CL 88 (L) 07/16/2022 0555   CO2 26 07/16/2022 0555   GLUCOSE 149 (H) 07/16/2022 0555   BUN 23 (H) 07/16/2022 0555   BUN 20 11/23/2021 0814   CREATININE 1.22 07/16/2022 0555   CALCIUM 8.8 (L) 07/16/2022 0555   PROT 6.6 07/07/2022 0354   ALBUMIN 2.9 (L) 07/07/2022 0354   AST 57 (H) 07/07/2022 0354   ALT  21 07/07/2022 0354   ALKPHOS 89 07/07/2022 0354   BILITOT 0.7 07/07/2022 0354   GFRNONAA >60 07/16/2022 0555   GFRAA >60 04/21/2020 1659   ABG    Component Value Date/Time   PHART 7.557 (H) 07/14/2022 0513   PCO2ART 37.1 07/14/2022 0513   PO2ART 113 (H) 07/14/2022 0513   HCO3 33.1 (H) 07/14/2022 0513   TCO2 34 (H) 07/14/2022 0513   ACIDBASEDEF 2.0 07/07/2022 0510   O2SAT 63.1 07/16/2022 0555   CBG (last 3)  Recent Labs    07/15/22 1536 07/15/22 2232 07/16/22 0637  GLUCAP 199* 346* 127*  EXAM Lungs: crackles and rhonchi Card: device noise Ext: no edema   ASSESSMENT: SP VAD Milrinone stopped this am. Coox 63. VAD speed unchanged Has good diuresis on current diuretic. Cr down to 1.2 INR 2.3 continue coumadin Resp insufficiency continues secondary to pathcy infiltrates, cont anitbiotics   Coralie Common, MD 07/16/2022

## 2022-07-16 NOTE — Progress Notes (Signed)
NAME:  Darryl Frye, MRN:  [id number], DOB:  06-15-1961, LOS: 50 ADMISSION DATE:  06/27/2022, CONSULTATION DATE:  11/14 REFERRING MD:  Aundra Dubin, CHIEF COMPLAINT:  critical care support s/p LVAD   History of Present Illness:  61 year old male w/ sig h/o NICM (EF 20-25%), af/flutter. Presented 11/14 for LVAD after failing out-pt medical therapies. Initial flow 4.5 l/min speed 5100 power 3.6 P1 2.3  Pertinent  Medical History  Afib/flutter s/p ablation May 2023 NICM EF 20-25% has ICD Remote h/o heavy ETOH and cocaine Class IV NYHF sxs.   Significant Hospital Events: Including procedures, antibiotic start and stop dates in addition to other pertinent events   11/14 LVAD placed. Returned to ICU full vent support. On iNO 20ppm, Returned to ICU post-op intubated on Neo, epi 6, and amio gtt. Initial hemodynamics PA 34/20 CO 4.9 CI 2.4 co ox 70% l CVP 10-. On full vent support. PCCM asked to assist w/ care  11/15 iNO weaned off. Got lasix. Pump speed increased to 5200. Still on epi and milrione. Awake following commands 11/15 attempted SBT but saturations dropped 11/16 extubated  11/17 mobilizations, remains on pressors   Interim History / Subjective:  Patient stated still feeling short of breath even with minimal activity Lasix infusion was stopped yesterday Amiodarone was stopped this morning Coox was 63% Diuresed well with net -1.1 L  Objective   Blood pressure 103/81, pulse 94, temperature 97.9 F (36.6 C), temperature source Oral, resp. rate (!) 23, height 5\' 11"  (1.803 m), weight 88.2 kg, SpO2 90 %. CVP:  [2 mmHg-23 mmHg] 23 mmHg  FiO2 (%):  [50 %] 50 %   Intake/Output Summary (Last 24 hours) at 07/16/2022 1149 Last data filed at 07/16/2022 1104 Gross per 24 hour  Intake 1225.79 ml  Output 2250 ml  Net -1024.21 ml   Filed Weights   07/14/22 0500 07/15/22 0500 07/16/22 0500  Weight: 87.1 kg 88 kg 88.2 kg   Examination: General: Middle-age male, sitting on the bed on  Ventimask HENT: NCAT, tracking appropriately Heart: Hum from LVAD, midline sternal incision Pulm: Faint bilateral basal crackles, right more than left, no rhonchi or wheezes Abd: Soft nontender nondistended Ext: Warm dry no edema Neuro: Alert, awake, following commands Skin: No rash  Assessment & Plan:  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 s/p LVAD HM3 Milrinone was stopped this morning Continue sildenafil and digoxin Continue p.o. amiodarone Off Lasix infusion Remains net -1.1 L, despite that Coox is 63% LDH is downtrending  Left nare epistaxis Acute Hypoxemic Respiratory Failure  Bilateral multifocal pneumonia Metabolic alkalosis Patient had left nostril bleeding few days ago due to dryness from high flow nasal cannula oxygen Left nostril was packed by ENT, packing will be removed on Monday His INR is therapeutic at 2.3 Currently on Ventimask, titrate oxygen with O2 sat goal 92% No more bleeding from left nostril now X-ray chest is suggestive of improvement in patchy infiltrates after starting antibiotics Respiratory culture is growing Capnocytophaga Continue IV Zosyn to complete 7 days therapy Continue PT/OT  Continue incentive spirometry and flutter valve  H/o afib/flutter s/p prior ablation Currently in normal sinus rhythm On Amiodarone and digoxin Continue Coumadin, INR is 2.3  H/o DVT Continue Coumadin, INR is therapeutic Appreciate pharmacy to see help.  Hypervolemic hyponatremia Serum sodium remained at 129 Closely monitor  Hypokalemia/hypophosphatemia/hypocalcemia, resolved  Acute kidney injury likely due to cardiorenal syndrome and now with aggressive diuresis Also it could be related to ischemic  ATN from cardiogenic shock Serum creatinine trended down to 1.2 from 1.4 Monitor intake and output Avoid nephrotoxic agents  Best Practice (right click and "Reselect all SmartList Selections" daily)   Diet/type: 2 g sodium  diet DVT prophylaxis: Coumadin GI prophylaxis: Protonix Lines: Central line Foley:  Yes, and it is still needed Code Status:  limited Last date of multidisciplinary goals of care discussion [Per primary team]   Total critical care time: 32 minutes  Performed by: [facility] care time was exclusive of separately billable procedures and treating other patients.   Critical care was necessary to treat or prevent imminent or life-threatening deterioration.   Critical care was time spent personally by me on the following activities: development of treatment plan with patient and/or surrogate as well as nursing, discussions with consultants, evaluation of patient's response to treatment, examination of patient, obtaining history from patient or surrogate, ordering and performing treatments and interventions, ordering and review of laboratory studies, ordering and review of radiographic studies, pulse oximetry and re-evaluation of patient's condition.   Jacky Kindle, MD [facility] See Amion for pager If no response to pager, please call [phone] until 7pm After 7pm, Please call E-link [phone]

## 2022-07-17 ENCOUNTER — Inpatient Hospital Stay (HOSPITAL_COMMUNITY): Payer: Medicare Other

## 2022-07-17 DIAGNOSIS — J9601 Acute respiratory failure with hypoxia: Secondary | ICD-10-CM | POA: Diagnosis not present

## 2022-07-17 DIAGNOSIS — J810 Acute pulmonary edema: Secondary | ICD-10-CM

## 2022-07-17 DIAGNOSIS — I509 Heart failure, unspecified: Secondary | ICD-10-CM

## 2022-07-17 DIAGNOSIS — R5381 Other malaise: Secondary | ICD-10-CM

## 2022-07-17 LAB — BASIC METABOLIC PANEL
Anion gap: 13 (ref 5–15)
BUN: 25 mg/dL — ABNORMAL HIGH (ref 6–20)
CO2: 24 mmol/L (ref 22–32)
Calcium: 8.6 mg/dL — ABNORMAL LOW (ref 8.9–10.3)
Chloride: 91 mmol/L — ABNORMAL LOW (ref 98–111)
Creatinine, Ser: 1.34 mg/dL — ABNORMAL HIGH (ref 0.61–1.24)
GFR, Estimated: >60 mL/min (ref >60)
Glucose, Bld: 164 mg/dL — ABNORMAL HIGH (ref 70–99)
Potassium: 4.4 mmol/L (ref 3.5–5.1)
Sodium: 128 mmol/L — ABNORMAL LOW (ref 135–145)

## 2022-07-17 LAB — GLUCOSE, CAPILLARY
Glucose-Capillary: 203 mg/dL — ABNORMAL HIGH (ref 70–99)
Glucose-Capillary: 186 mg/dL — ABNORMAL HIGH (ref 70–99)
Glucose-Capillary: 196 mg/dL — ABNORMAL HIGH (ref 70–99)
Glucose-Capillary: 284 mg/dL — ABNORMAL HIGH (ref 70–99)

## 2022-07-17 LAB — CBC
HCT: 23.1 % — ABNORMAL LOW (ref 39.0–52.0)
Hemoglobin: 7.6 g/dL — ABNORMAL LOW (ref 13.0–17.0)
MCH: 27.8 pg (ref 26.0–34.0)
MCHC: 32.9 g/dL (ref 30.0–36.0)
MCV: 84.6 fL (ref 80.0–100.0)
Platelets: 551 10*3/uL — ABNORMAL HIGH (ref 150–400)
RBC: 2.73 MIL/uL — ABNORMAL LOW (ref 4.22–5.81)
RDW: 16 % — ABNORMAL HIGH (ref 11.5–15.5)
WBC: 13.2 10*3/uL — ABNORMAL HIGH (ref 4.0–10.5)
nRBC: 0.2 % (ref 0.0–0.2)

## 2022-07-17 LAB — COOXEMETRY PANEL
Carboxyhemoglobin: 2.6 % — ABNORMAL HIGH (ref 0.5–1.5)
Methemoglobin: 0.9 % (ref 0.0–1.5)
O2 Saturation: 66.5 %
Total hemoglobin: 7.8 g/dL — ABNORMAL LOW (ref 12.0–16.0)

## 2022-07-17 LAB — PROTIME-INR
INR: 2.2 — ABNORMAL HIGH (ref 0.8–1.2)
Prothrombin Time: 24.2 seconds — ABNORMAL HIGH (ref 11.4–15.2)

## 2022-07-17 LAB — PREPARE RBC (CROSSMATCH)

## 2022-07-17 LAB — LACTATE DEHYDROGENASE: LDH: 243 U/L — ABNORMAL HIGH (ref 98–192)

## 2022-07-17 MED ORDER — FUROSEMIDE 10 MG/ML IJ SOLN
80.0000 mg | Freq: Once | INTRAMUSCULAR | Status: AC
  Administered 2022-07-17: 80 mg via INTRAVENOUS
  Filled 2022-07-17: qty 8

## 2022-07-17 MED ORDER — TORSEMIDE 20 MG PO TABS
40.0000 mg | ORAL_TABLET | Freq: Two times a day (BID) | ORAL | Status: DC
  Administered 2022-07-17: 40 mg via ORAL
  Filled 2022-07-17: qty 2

## 2022-07-17 MED ORDER — WARFARIN SODIUM 2.5 MG PO TABS
2.5000 mg | ORAL_TABLET | Freq: Once | ORAL | Status: AC
  Administered 2022-07-17: 2.5 mg via ORAL
  Filled 2022-07-17: qty 1

## 2022-07-17 MED ORDER — SODIUM CHLORIDE 0.9% IV SOLUTION: Freq: Once | INTRAVENOUS | Status: AC

## 2022-07-17 NOTE — Progress Notes (Signed)
[facility] Progress Note:  Seen this AM Sitting up in chair On FM 02 (nasal bleeding over thanskgiving holiday, no nasal 02) Picc line in place  Urinary catheter in place.      Latest Ref Rng & Units 07/17/2022    4:45 AM 07/16/2022    5:55 AM 07/15/2022    4:30 AM  CBC  WBC 4.0 - 10.5 K/uL 13.2  15.1  15.9   Hemoglobin 13.0 - 17.0 g/dL 7.6  7.8  7.9   Hematocrit 39.0 - 52.0 % 23.1  24.6  24.6   Platelets 150 - 400 K/uL 551  534  517        Latest Ref Rng & Units 07/17/2022    4:45 AM 07/16/2022    5:55 AM 07/15/2022    4:30 AM  CMP  Glucose 70 - 99 mg/dL 164  149  174   BUN 6 - 20 mg/dL 25  23  25    Creatinine 0.61 - 1.24 mg/dL 1.34  1.22  1.40   Sodium 135 - 145 mmol/L 128  129  130   Potassium 3.5 - 5.1 mmol/L 4.4  3.7  3.8   Chloride 98 - 111 mmol/L 91  88  88   CO2 22 - 32 mmol/L 24  26  27    Calcium 8.9 - 10.3 mg/dL 8.6  8.8  8.7     ABG    Component Value Date/Time   PHART 7.557 (H) 07/14/2022 0513   PCO2ART 37.1 07/14/2022 0513   PO2ART 113 (H) 07/14/2022 0513   HCO3 33.1 (H) 07/14/2022 0513   TCO2 34 (H) 07/14/2022 0513   ACIDBASEDEF 2.0 07/07/2022 0510   O2SAT 66.5 07/17/2022 0445    FiO2 (%):  [45 %-50 %] 45 %  POD 13 LVAD  N no issues CV: Good flows, no VAD issues. Off milrinone over the weekend. Resp: cont FM 02 GI: no issues GU: catheter in place.   Intake/Output Summary (Last 24 hours) at 07/17/2022 0721 Last data filed at 07/17/2022 0700 Gross per 24 hour  Intake 1856.63 ml  Output 2255 ml  Net -398.37 ml   Heme: hgb 7.6.  If not able to participate much in therapy consider 1u blood Warfarin 2.5 mg - INR 2.2 today. I have ordered thsi dose today ID: on Zosyn - lung infiltrates.  T/L/D Keep Picc. Urinary catheter in place   Dispo: Stay in ICU while requiring FM 02.

## 2022-07-17 NOTE — Progress Notes (Signed)
   NAME:  Darryl Frye, MRN:  [id number], DOB:  [birth year], LOS: 76 ADMISSION DATE:  06/27/2022, CONSULTATION DATE:  11/14 REFERRING MD:  Aundra Dubin, CHIEF COMPLAINT:  critical care support s/p LVAD   History of Present Illness:  61 year old male w/ sig h/o NICM (EF 20-25%), af/flutter. Presented 11/14 for LVAD after failing out-pt medical therapies. Initial flow 4.5 l/min speed 5100 power 3.6 P1 2.3  Pertinent  Medical History  Afib/flutter s/p ablation May 2023 NICM EF 20-25% has ICD Remote h/o heavy ETOH and cocaine Class IV NYHF sxs.   Significant Hospital Events: Including procedures, antibiotic start and stop dates in addition to other pertinent events   11/14 LVAD placed. Returned to ICU full vent support. On iNO 20ppm, Returned to ICU post-op intubated on Neo, epi 6, and amio gtt. Initial hemodynamics PA 34/20 CO 4.9 CI 2.4 co ox 70% l CVP 10-. On full vent support. PCCM asked to assist w/ care  11/15 iNO weaned off. Got lasix. Pump speed increased to 5200. Still on epi and milrione. Awake following commands 11/15 attempted SBT but saturations dropped 11/16 extubated  11/17 mobilizations, remains on pressors  11/22  nasal packing by ENT  Interim History / Subjective:  Dyspneic with activity, walked 3 laps today but needed several breaks. Remains on 10L at 40% O2. -2.255 UOP past 24, net -24L since admit.  Objective   Blood pressure (!) 104/92, pulse 90, temperature 97.7 F (36.5 C), resp. rate 17, height 5\' 11"  (1.803 m), weight 88.9 kg, SpO2 98 %. CVP:  [9 mmHg-53 mmHg] 53 mmHg  FiO2 (%):  [40 %-50 %] 40 %   Intake/Output Summary (Last 24 hours) at 07/17/2022 1020 Last data filed at 07/17/2022 1006 Gross per 24 hour  Intake 1616.63 ml  Output 2180 ml  Net -563.37 ml    Filed Weights   07/15/22 0500 07/16/22 0500 07/17/22 0500  Weight: 88 kg 88.2 kg 88.9 kg   Examination: General: Middle-age male, sitting on the bed on Ventimask, comfortable HENT: NCAT, tracking  appropriately Heart: Hum from LVAD, midline sternal incision Pulm: Faint bilateral basal crackles, right more than left Abd: Soft nontender nondistended Ext: Warm dry no edema Neuro: Alert, awake, following commands Skin: No rash  Assessment & Plan:   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  Left nare epistaxis - s/p cautery and packing 11/22. - ENT to remove packing today.  Acute Hypoxemic Respiratory Failure - 2/2 bilateral multifocal pneumonia (sputum culture positive for Capnocytophaga) - Continue supplemental O2 as needed to maintain SpO2 > 92%. - Transition back to [place] once nasal packing is out. - Continue Zosyn for total 7 days (stop date 11/29). - Bronchial hygiene.  H/o afib/flutter s/p prior ablation - Continue Amio, Coumadin, Dig.  H/o DVT Continue Coumadin.   Best Practice (right click and "Reselect all SmartList Selections" daily)   Diet/type: 2 g sodium diet DVT prophylaxis: Coumadin GI prophylaxis: Protonix Lines: Central line Foley:  Yes, and it is still needed Code Status:  limited Last date of multidisciplinary goals of care discussion [Per primary team]   CC time: 30 minutes  Montey Hora, [place] For pager details, please see AMION or use Epic chat  After 1900, please call [place] for cross coverage needs 07/17/2022, 10:30 AM

## 2022-07-17 NOTE — Progress Notes (Signed)
No bleeding since packing placed.  Packing removed.  No signs of any blood staining.  Clear mucus secretions.  Call if needed.

## 2022-07-17 NOTE — Progress Notes (Signed)
CT surgery PM rounds  Patient did well today, nasal packing removed and no more bleeding Now on 2 L nasal cannula Ambulated with physical therapy 160 feet. Maintaining sinus rhythm Excellent VAD flows at current speed  Blood pressure 97/72, pulse 89, temperature 99.2 F (37.3 C), temperature source Oral, resp. rate 17, height 5\' 11"  (1.803 m), weight 88.9 kg, SpO2 92 %.

## 2022-07-17 NOTE — Progress Notes (Signed)
LVAD Coordinator Rounding Note:  HM 3 LVAD implanted on 07/04/22 by Dr Tenny Craw under destination criteria due to uncontrolled diabetes. Left atrial appendage clipped at time of surgery.  Pt sitting up in bed upon my arrival. States he is feeling better this morning after having nasal packing removed per ENT. Transitioning over to nasal cannula currently.   Coox 67%. Weight stable. Remains in NSR. Tolerating speed 5400. Receiving 1 unit PRBC for Hgb 7.6 this morning.   Remains weak. Reports he walked a short distance in the hall this morning, but had to take 3 rest breaks due to shortness of breath. Plan for CIR. Will need VAD education completed prior to admission to CIR.   Spoke with pt's caregiver [name]- will plan to meet at bedside tomorrow afternoon for further dressing change and VAD education.   Vital signs: Temp: 98.8 HR: 86 NSR Doppler Pressure: not documented Arterial BP: 92/77 (84) O2 Sat: 97% on 6L/40% facemask- transitioning to 6L [place] Wt: 214.3>208.8>209.2>207.2>206.6>205.3>200>197.1>195.9 lb  LVAD interrogation reveals:  Speed: 5400 Flow: 4.5 Power: 4.0 w PI: 3.4   Alarms: none Events: rare Hematocrit: 23  Fixed speed: 5300 Low speed limit: 5000   Drive Line:  Dressing clean, dry, and intact. Anchor correctly applied. Next dressing change 07/18/22 to be performed by nurse champion, [place] coordinator, or trained caregiver.  Labs:  LDH trend: 326>400>462>422>387>388>368>354>243  INR trend: 1.1>1.5>1.4>1.5>3.0>2.4>2.2>2.2>2.2  WBC trend: 12.6>18>20.9>17.4>11.5>11.0>15.1>16.4>13.2  Anticoagulation Plan: -INR Goal: 2.0 - 2.5 -ASA Dose: 81mg    Blood Products:  IntraOp 11/14:  - 4 FFP  - DDAVP - 617 cell saver  PostOp 11/14: - 2 FFP  07/17/22: - 1 PRBC  Device: -Medtronic -Therapies: OFF  Arrythmias: AFIB RVR 11/17. Amiodarone transitioned to p.o. 07/12/22  Respiratory: Extubated 07/06/22  Renal:   -BUN:25>18>20>26>27>25>17>25 -CRT:1.28>1.23>1.44>1.46>1.47>1.27>1.1>1.34  Drips:  Infection: 07/07/22>> blood cultures>> no growth 5 days; final 07/07/22>> expectorated sputum>>moderate capnocutophaga; final     Pt Education: No family at bedside. Pt currently eating lunch.  Will plan to continue dressing change/VAD education with caregivers tomorrow afternoon  Plan/Recommendations:  1. Page VAD coordinator for equipment or drive line issues. 2. Continue every other day drive line dressing changes per VAD coordinator, nurse champion, or trained caregiver.  Emerson Monte RN [place] Coordinator  Office: [phone]  24/7 Pager: [phone]

## 2022-07-17 NOTE — Progress Notes (Signed)
Physical Therapy Treatment Patient Details Name: Darryl Frye MRN: [id number] DOB: May 05, 1961 Today's Date: 07/17/2022   History of Present Illness Pt is a 61 y.o. male presenting 06/27/22 with CHF for RHC; admitted for inotrope and diuresis.  Other PMH includes NICM s/p ICD, aflutter, CAD, DM, DVT, HTN, heart murmur, renal disorder.  S/p LVAD 11/14.    PT Comments    Pt making excellent progress towards his physical therapy goals and remains motivated to participate. Able to switch from wall power to batteries with min cues and assist for set up. Pt ambulating a total of 120 ft with an [place] walker and close chair follow; requires 3 sitting rest breaks. Pt continues with decreased cardiopulmonary endurance, functional weakness, impaired standing balance. Continue to recommend acute inpatient rehab (AIR) for post-acute therapy needs.    Recommendations for follow up therapy are one component of a multi-disciplinary discharge planning process, led by the attending physician.  Recommendations may be updated based on patient status, additional functional criteria and insurance authorization.  Follow Up Recommendations  Acute inpatient rehab (3hours/day)     Assistance Recommended at Discharge Frequent or constant Supervision/Assistance  Patient can return home with the following A little help with walking and/or transfers;A little help with bathing/dressing/bathroom;Assistance with cooking/housework;Assist for transportation;Help with stairs or ramp for entrance   Equipment Recommendations  BSC/3in1;Rollator (4 wheels)    Recommendations for Other Services       Precautions / Restrictions Precautions Precautions: Fall;Sternal Precaution Comments: LVAD, HFNC Restrictions Weight Bearing Restrictions: Yes (sternal precautions)     Mobility  Bed Mobility Overal bed mobility: Needs Assistance Bed Mobility: Supine to Sit     Supine to sit: Min guard     General bed mobility  comments: Use of sternal pillow    Transfers Overall transfer level: Needs assistance Equipment used: None Transfers: Sit to/from Stand Sit to Stand: Min assist           General transfer comment: Rocking to gain momentum, cues for glute activation when upright    Ambulation/Gait Ambulation/Gait assistance: Min assist, +2 safety/equipment Gait Distance (Feet): 120 Feet (3 seated rest breaks) Assistive device: Ethelene Hal Gait Pattern/deviations: Step-through pattern, Decreased stride length, Drifts right/left Gait velocity: decreased     General Gait Details: Improved posture and knee stability, min A overall for balance, close chair follow utilized. Pt requiring 3 sitting rest breaks   Stairs             Wheelchair Mobility    Modified Rankin (Stroke Patients Only)       Balance Overall balance assessment: Needs assistance Sitting-balance support: Feet supported Sitting balance-Leahy Scale: Good     Standing balance support: No upper extremity supported, During functional activity Standing balance-Leahy Scale: Fair                              Cognition Arousal/Alertness: Awake/alert Behavior During Therapy: WFL for tasks assessed/performed Overall Cognitive Status: Within Functional Limits for tasks assessed                                          Exercises General Exercises - Lower Extremity Heel Slides: Both, 15 reps, Supine Straight Leg Raises: Both, 10 reps, Supine    General Comments        Pertinent Vitals/Pain Pain Assessment Pain Assessment: Faces  Faces Pain Scale: No hurt    Home Living                          Prior Function            PT Goals (current goals can now be found in the care plan section) Acute Rehab PT Goals Patient Stated Goal: feel better Potential to Achieve Goals: Good Progress towards PT goals: Progressing toward goals    Frequency    Min 3X/week      PT  Plan Current plan remains appropriate    Co-evaluation              AM-PAC PT "6 Clicks" Mobility   Outcome Measure  Help needed turning from your back to your side while in a flat bed without using bedrails?: A Little Help needed moving from lying on your back to sitting on the side of a flat bed without using bedrails?: A Little Help needed moving to and from a bed to a chair (including a wheelchair)?: A Little Help needed standing up from a chair using your arms (e.g., wheelchair or bedside chair)?: A Little Help needed to walk in hospital room?: A Little Help needed climbing 3-5 steps with a railing? : A Lot 6 Click Score: 17    End of Session Equipment Utilized During Treatment: Oxygen Activity Tolerance: Patient tolerated treatment well Patient left: in chair;with call bell/phone within reach;with family/visitor present Nurse Communication: Mobility status PT Visit Diagnosis: Unsteadiness on feet (R26.81);Difficulty in walking, not elsewhere classified (R26.2)     Time: 1340-1415 PT Time Calculation (min) (ACUTE ONLY): 35 min  Charges:  $Therapeutic Activity: 23-37 mins                     Wyona Almas, PT, DPT Acute Rehabilitation Services Office [phone]    [name] 07/17/2022, 3:28 PM

## 2022-07-17 NOTE — Progress Notes (Addendum)
Patient ID: Darryl Frye, male   DOB: November 18, 1960, 61 y.o.   MRN: [id number] Advanced Heart Failure VAD Team Note  PCP-Cardiologist: Carlyle Dolly, MD   Subjective:    - 11/13 S/P HMIII L atrial appendage closure.  - 11/16 Extubated . GIven 40 mg IV and started on lasix drip 10 mg per/hr.  - 11/17 Developed A fib RVR. Given bolus 150 x 2. Unchanged.  - 11/18 Bedside echo 11/18 LV small. VAD cannula well aligned. AoV opening every beat. Septum pulling to left RV moderately HK. Small to moderate anterior effusion - 11/22 Ramp echo - 11/22 Nasal cautery and packing by ENT. Ramp echo. Speed increased to 5400.  CT chest suggestive of patchy multifocal PNA and atelectasis.   CO-OX 67% off milrinone. CVP 10. Off Lasix gtt and now on torsemide 40 bid. Weight stable, creatinine 1.34.   Sputum culture + for capnocytophaga. CCM switched to zosyn given persistent infiltrates on CXR.   Still requiring oxygen, wearing FM due to nasal packing.    Remains weak. Walked this morning around unit but took 3 rest breaks for dyspnea.   Hgb 7.6 today.   MAPs 80s   LVAD INTERROGATION:  HeartMate III LVAD:   Flow 4.5 liters/min, speed 5400, power 4 , PI 3.4.   LDH stable 271 => 258 => 243 INR 2.2  Objective:    Vital Signs:   Temp:  [97.7 F (36.5 C)-99 F (37.2 C)] 99 F (37.2 C) (11/27 0400) Pulse Rate:  [83-198] 92 (11/27 0700) Resp:  [14-34] 20 (11/27 0700) BP: (69-139)/(54-120) 107/75 (11/27 0700) SpO2:  [82 %-100 %] 93 % (11/27 0700) FiO2 (%):  [45 %-50 %] 45 % (11/27 0000) Weight:  [88.9 kg] 88.9 kg (11/27 0500) Last BM Date : 07/15/22 Mean arterial Pressure 80s  Intake/Output:   Intake/Output Summary (Last 24 hours) at 07/17/2022 0737 Last data filed at 07/17/2022 0700 Gross per 24 hour  Intake 1856.63 ml  Output 2255 ml  Net -398.37 ml     Physical Exam   General: Wearing facemask. NAD.  HEENT: Left nasal packing Neck: Supple, JVP 10 cm. Carotids OK.  Cardiac:   Mechanical heart sounds with LVAD hum present.  Lungs:  CTAB, normal effort.  Abdomen:  NT, ND, no HSM. No bruits or masses. +BS  LVAD exit site: Well-healed and incorporated. Dressing dry and intact. No erythema or drainage. Stabilization device present and accurately applied. Driveline dressing changed daily per sterile technique. Extremities:  Warm and dry. No cyanosis, clubbing, rash, or edema.  Neuro:  Alert & oriented x 3. Cranial nerves grossly intact. Moves all 4 extremities w/o difficulty. Affect pleasant    Telemetry   Sinus 80-90 Personally reviewed   Labs   Basic Metabolic Panel: Recent Labs  Lab 07/11/22 0426 07/11/22 0617 07/12/22 0435 07/12/22 0533 07/13/22 0504 07/14/22 0400 07/14/22 0513 07/15/22 0430 07/16/22 0555 07/17/22 0445  NA 134*   < > 130*   < > 132* 133* 129* 130* 129* 128*  K 3.9   < > 4.3   < > 3.8 3.9 4.0 3.8 3.7 4.4  CL 91*  --  88*   < > 87* 88*  --  88* 88* 91*  CO2 28  --  29   < > 31 30  --  27 26 24   GLUCOSE 135*  --  223*   < > 96 96  --  174* 149* 164*  BUN 17  --  32*   < >  27* 25*  --  25* 23* 25*  CREATININE 1.07  --  1.21   < > 1.10 1.22  --  1.40* 1.22 1.34*  CALCIUM 8.7*  --  8.6*   < > 8.6* 8.7*  --  8.7* 8.8* 8.6*  MG 1.8  --  2.1  --   --   --   --  2.4  --   --   PHOS 2.8  --   --   --   --   --   --   --   --   --    < > = values in this interval not displayed.    Liver Function Tests: No results for input(s): "AST", "ALT", "ALKPHOS", "BILITOT", "PROT", "ALBUMIN" in the last 168 hours. No results for input(s): "LIPASE", "AMYLASE" in the last 168 hours. No results for input(s): "AMMONIA" in the last 168 hours.  CBC: Recent Labs  Lab 07/11/22 0426 07/11/22 0617 07/13/22 0504 07/14/22 0513 07/14/22 0942 07/15/22 0430 07/16/22 0555 07/17/22 0445  WBC 15.1*   < > 15.5*  --  16.6* 15.9* 15.1* 13.2*  NEUTROABS 11.9*  --   --   --   --   --   --   --   HGB 9.3*   < > 8.0* 8.8* 8.0* 7.9* 7.8* 7.6*  HCT 27.2*   < >  23.5* 26.0* 23.9* 24.6* 24.6* 23.1*  MCV 80.5   < > 81.3  --  82.7 84.5 85.7 84.6  PLT 306   < > 395  --  462* 517* 534* 551*   < > = values in this interval not displayed.    INR: Recent Labs  Lab 07/13/22 0504 07/14/22 0400 07/15/22 0430 07/16/22 0555 07/17/22 0445  INR 2.0* 2.0* 2.2* 2.3* 2.2*  :    Imaging   No results found.   Medications:     Scheduled Medications:  sodium chloride   Intravenous Once   sodium chloride   Intravenous Once   amiodarone  200 mg Oral BID   aspirin  81 mg Oral Daily   atorvastatin  20 mg Oral Daily   bisacodyl  10 mg Oral Daily   Or   bisacodyl  10 mg Rectal Daily   Chlorhexidine Gluconate Cloth  6 each Topical Daily   digoxin  0.125 mg Oral Daily   Fe Fum-Vit C-Vit B12-FA  1 capsule Oral QPC breakfast   feeding supplement  237 mL Oral TID BM   furosemide  80 mg Intravenous Once   [START ON 07/19/2022] influenza vac split quadrivalent PF  0.5 mL Intramuscular Tomorrow-1000   insulin aspart  0-24 Units Subcutaneous TID WC & HS   insulin glargine-yfgn  22 Units Subcutaneous QHS   insulin glargine-yfgn  25 Units Subcutaneous Daily   ipratropium-albuterol  3 mL Nebulization TID   levothyroxine  25 mcg Oral Q0600   lidocaine  2 patch Transdermal Q24H   magnesium oxide  400 mg Oral BID   melatonin  3 mg Oral QHS   multivitamin with minerals  1 tablet Oral Daily   pantoprazole  40 mg Oral Daily   [START ON 07/19/2022] pneumococcal 20-valent conjugate vaccine  0.5 mL Intramuscular Tomorrow-1000   polyethylene glycol  17 g Oral BID   senna-docusate  1 tablet Oral BID   sertraline  50 mg Oral Daily   sildenafil  20 mg Oral TID   sodium chloride flush  10-40 mL Intracatheter Q12H   sodium chloride  flush  10-40 mL Intracatheter Q12H   sodium chloride flush  3 mL Intravenous Q12H   sodium chloride flush  3 mL Intravenous Q12H   spironolactone  25 mg Oral Daily   torsemide  40 mg Oral BID   warfarin  2.5 mg Oral ONCE-1600   Warfarin -  Physician Dosing Inpatient   Does not apply q1600    Infusions:  sodium chloride Stopped (07/07/22 1346)   sodium chloride     sodium chloride Stopped (07/17/22 0641)   lactated ringers     lactated ringers Stopped (07/04/22 2207)   lactated ringers Stopped (07/14/22 0801)   piperacillin-tazobactam (ZOSYN)  IV 12.5 mL/hr at 07/17/22 0700    PRN Medications: sodium chloride, acetaminophen, ALPRAZolam, dextrose, hydrALAZINE, lactated ringers, morphine injection, ondansetron (ZOFRAN) IV, ondansetron (ZOFRAN) IV, mouth rinse, oxyCODONE, sodium chloride flush, sodium chloride flush, sodium chloride flush, traMADol, tranexamic acid   Patient Profile   Admitted with A/C HFrEF --> cardiogenic shock. S/P HMIII LVAD  Assessment/Plan:    1. Acute on chronic systolic CHF/cardiogenic shock: Nonischemic cardiomyopathy, diagnosed 2020.  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  Cath in 12/20 with no significant coronary disease.  Medtronic ICD. Most recent echo in 2/23 showed EF 20-25% with normal RV.  Recently, patient has been symptomatically worse, NYHA class IV with profound orthopnea. RHC showed markedly elevated filling pressures, primarily pulmonary venous hypertension, low cardiac output, and low PAPI.  Patient was admitted for inotrope and diuresis. Echo this admission with EF <20%, mod RV dysfunction.  He was started on milrinone with improvement in hemodynamics.  We proceeded with HM3 LVAD with LAA clip on 11/14.  He is now off milrinone and remains on sildenafil 20 tid.  Co-ox 67%. CVP 10. Creatinine stable. Good flows on LVAD.  - Continue torsemide 40 bid but will replace morning torsemide with Lasix 80 mg IV x 1 as I will give a unit of PRBCs.  - Continue sildenafil 20 tid.  - Continue 25 mg spiro daily.  -  Continue digoxin 0.125 daily.  2. A fib RVR, post-op: H/O S/p flutter ablation.  Remains in NSR.  - Continue po amiodarone 200 BID - Continue warfarin.  3. VAD management: s/p HM-3 VAD on 11/14.  Bedside echo 11/18 LV small, VAD cannula well aligned, AoV opening every beat, septum pulling to left RV moderately HK, small to moderate anterior effusion. Ramp echo 11/22 RV mild to mod HK. Speed turned to 5400. LDH 243. MAP 80s - Now on warfarin with INR 2.2 - ASA 81. - VAD interrogated personally. Parameters stable. 4. H/o DVT: On anticoagulation.  5. Acute hypoxemic respiratory failure, post-op. 11/16 extubated.  Bilateral lower lobe infiltrates on CXR. CVP trending down but CXR has not improved, still with bilateral infiltrates and significant oxygen requirement.  He has been on amiodarone this admission for atrial fibrillation, was not on amiodarone before this hospitalization.  Will need to think about amiodarone lung toxicity, discussed with Dr Tacy Learn who does not think particularly likely but will need to monitor for improvement.  It is also possible that he aspirated.  Sputum culture + for capnocytophaga. CCM switched to zosyn given persistent infiltrates on CXR.CT chest w/o contrast. CT chest 11/22 with diffuse interstitial and patchy alveolar opacities. He continues to have significant oxygen requirement.  -  Repeat CXR today.  - Encourage hourly incentive spirometry. - Encourage mobility.  - Continue Zosyn x 1 week.  6. AKI: Creatinine stable at 1.3. - Diuretic management as above 7. ID: Afebrile, WBCs 13. He completed initial vanc/cefepime empiric abx for ?post-op PNA. 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 CCM switched to zosyn given persistent infiltrates on imaging - Zosyn x 1 week.  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  9. Hypokalemia - Continue spironolactone.  10. Epistaxis: s/p cauterization/packing 11/22 - remove packing today.  11. Anemia: Hgb 7.9 => 7.8 => 7.6.  -  With ongoing exertional dyspnea, will transfuse 1 unit PRBCs with IV Lasix today.   12. Hyponatremia: Na 128. Fluid restrict.  13. Deconditioning - PT/OT following  - CIR  Loralie Champagne MD 07/17/2022 7:37 AM

## 2022-07-17 NOTE — Progress Notes (Signed)
Nutrition Follow-up  DOCUMENTATION CODES:   Not applicable  INTERVENTION:   Continue to encourage po intake; allow double protein at meals  Ensure Enlive po TID, each supplement provides 350 kcal and 20 grams of protein.  Continue MVI with Minerals  Trial 30 ml ProSource Plus BID, each supplement provides 100 kcals and 15 grams protein.   NUTRITION DIAGNOSIS:   Increased nutrient needs related to catabolic illness, post-op healing as evidenced by estimated needs.  Being addressed  GOAL:   Patient will meet greater than or equal to 90% of their needs  Not met but being addressed  MONITOR:   PO intake, Supplement acceptance, Labs, Weight trends, Skin  REASON FOR ASSESSMENT:   Consult LVAD Eval  ASSESSMENT:   61 yo male admitted with acute on chronic CHF, now being evaluated for possible LVAD. Last ECHO 2/23 with EF 20-25% with EF <10% in 11/20 with initiatial dx. PMH includes nonischemic CM, a.fib/flutter, DM, CAD, HTN, DVT. 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  11/14 LVAD HM3 11/16 Extubated 11/22 L. nare epistaxis s/p cautery and nasal packing by ENT  Pt ambulating but requires frequent breaks, dyspneic with activity  CVP 10. Lasix off.  Noted pt still with significant oxygen requirements, nasal packing out today and ok to resume [place] per RN  Pt did not drink Ensure this AM but ate 75% of lunch per RN. Noted fluid restriction increased today by MD from 1.8 L to 1.5 L  Noted pt appears to be drinking 1-2 Ensure per day. Recorded po intake 25-75% of meals  Labs: sodium 128 (L), Creatinine 1.34 Meds: MVI with Minerals, mag ox, KCl   Diet Order:   Diet Order             Diet 2 gram sodium Room service appropriate? Yes; Fluid consistency: Thin; Fluid restriction: 1500 mL Fluid  Diet effective now                   EDUCATION NEEDS:   Education needs have been addressed  Skin:  Skin Assessment: Skin Integrity  Issues: Skin Integrity Issues:: Incisions Incisions: new LVAD placement  Last BM:  11/25  Height:   Ht Readings from Last 1 Encounters:  07/05/22 _0  (1.803 m)    Weight:   Wt Readings from Last 1 Encounters:  07/17/22 88.9 kg    Ideal Body Weight:     BMI:  Body mass index is 27.33 kg/m.  Estimated Nutritional Needs:   Kcal:  2100-2300 kcals  Protein:  110-125 g  Fluid:  1.8 L  Kerman Passey MS, RDN, LDN, CNSC Registered Dietitian 3 Clinical Nutrition RD Pager and On-Call Pager Number Located in [place]

## 2022-07-17 NOTE — Progress Notes (Signed)
Inpatient Rehab Admissions Coordinator:    Received approval from insurance for CIR admission. If medically ready, we should have a bed available for this patient this week. Continue to follow for admission to CIR.   Rehab Admissons Coordinator [place], [place], [place] [phone]

## 2022-07-17 NOTE — Research (Signed)
[facility] Informed Consent   Subject Name: Darryl Frye  Subject met inclusion and exclusion criteria.  The informed consent form, study requirements and expectations were reviewed with the subject and questions and concerns were addressed prior to the signing of the consent form.  The subject verbalized understanding of the trial requirements.  The subject agreed to participate in the [facility] trial and signed the informed consent  on 07-03-2022.  The informed consent was obtained prior to performance of any protocol-specific procedures for the subject.  A copy of the signed informed consent was given to the subject and a copy was placed in the subject's medical record.   [name]

## 2022-07-18 ENCOUNTER — Other Ambulatory Visit (HOSPITAL_COMMUNITY): Payer: Self-pay

## 2022-07-18 DIAGNOSIS — J690 Pneumonitis due to inhalation of food and vomit: Secondary | ICD-10-CM

## 2022-07-18 DIAGNOSIS — E039 Hypothyroidism, unspecified: Secondary | ICD-10-CM

## 2022-07-18 LAB — TYPE AND SCREEN
ABO/RH(D): O POS
Antibody Screen: NEGATIVE
Unit division: 0

## 2022-07-18 LAB — BASIC METABOLIC PANEL
Anion gap: 11 (ref 5–15)
BUN: 29 mg/dL — ABNORMAL HIGH (ref 6–20)
CO2: 26 mmol/L (ref 22–32)
Calcium: 8.7 mg/dL — ABNORMAL LOW (ref 8.9–10.3)
Chloride: 91 mmol/L — ABNORMAL LOW (ref 98–111)
Creatinine, Ser: 1.28 mg/dL — ABNORMAL HIGH (ref 0.61–1.24)
GFR, Estimated: >60 mL/min (ref >60)
Glucose, Bld: 138 mg/dL — ABNORMAL HIGH (ref 70–99)
Potassium: 4.2 mmol/L (ref 3.5–5.1)
Sodium: 128 mmol/L — ABNORMAL LOW (ref 135–145)

## 2022-07-18 LAB — COOXEMETRY PANEL
Carboxyhemoglobin: 2.5 % — ABNORMAL HIGH (ref 0.5–1.5)
Carboxyhemoglobin: 1.6 % — ABNORMAL HIGH (ref 0.5–1.5)
Carboxyhemoglobin: 1.8 % — ABNORMAL HIGH (ref 0.5–1.5)
Methemoglobin: 0.8 % (ref 0.0–1.5)
Methemoglobin: <0.7 % (ref 0.0–1.5)
Methemoglobin: 1.6 % — ABNORMAL HIGH (ref 0.0–1.5)
O2 Saturation: 90.2 %
O2 Saturation: 51.2 %
O2 Saturation: 52 %
Total hemoglobin: 8 g/dL — ABNORMAL LOW (ref 12.0–16.0)
Total hemoglobin: 8.6 g/dL — ABNORMAL LOW (ref 12.0–16.0)
Total hemoglobin: 8.9 g/dL — ABNORMAL LOW (ref 12.0–16.0)

## 2022-07-18 LAB — CBC
HCT: 26 % — ABNORMAL LOW (ref 39.0–52.0)
Hemoglobin: 8.3 g/dL — ABNORMAL LOW (ref 13.0–17.0)
MCH: 27.4 pg (ref 26.0–34.0)
MCHC: 31.9 g/dL (ref 30.0–36.0)
MCV: 85.8 fL (ref 80.0–100.0)
Platelets: 595 10*3/uL — ABNORMAL HIGH (ref 150–400)
RBC: 3.03 MIL/uL — ABNORMAL LOW (ref 4.22–5.81)
RDW: 15.9 % — ABNORMAL HIGH (ref 11.5–15.5)
WBC: 11.7 10*3/uL — ABNORMAL HIGH (ref 4.0–10.5)
nRBC: 0 % (ref 0.0–0.2)

## 2022-07-18 LAB — BRAIN NATRIURETIC PEPTIDE: B Natriuretic Peptide: 438.1 pg/mL — ABNORMAL HIGH (ref 0.0–100.0)

## 2022-07-18 LAB — GLUCOSE, CAPILLARY
Glucose-Capillary: 136 mg/dL — ABNORMAL HIGH (ref 70–99)
Glucose-Capillary: 287 mg/dL — ABNORMAL HIGH (ref 70–99)
Glucose-Capillary: 201 mg/dL — ABNORMAL HIGH (ref 70–99)
Glucose-Capillary: 257 mg/dL — ABNORMAL HIGH (ref 70–99)

## 2022-07-18 LAB — BPAM RBC
Blood Product Expiration Date: 202312032359
ISSUE DATE / TIME: 202311270928
Unit Type and Rh: 5100

## 2022-07-18 LAB — PROTIME-INR
INR: 1.9 — ABNORMAL HIGH (ref 0.8–1.2)
Prothrombin Time: 21.2 seconds — ABNORMAL HIGH (ref 11.4–15.2)

## 2022-07-18 LAB — LACTATE DEHYDROGENASE: LDH: 245 U/L — ABNORMAL HIGH (ref 98–192)

## 2022-07-18 MED ORDER — ALTEPLASE 2 MG IJ SOLR
INTRAMUSCULAR | Status: AC
  Filled 2022-07-18: qty 2

## 2022-07-18 MED ORDER — POTASSIUM CHLORIDE CRYS ER 20 MEQ PO TBCR
40.0000 meq | EXTENDED_RELEASE_TABLET | Freq: Once | ORAL | Status: AC
  Administered 2022-07-18: 40 meq via ORAL
  Filled 2022-07-18: qty 2

## 2022-07-18 MED ORDER — WARFARIN SODIUM 3 MG PO TABS
3.5000 mg | ORAL_TABLET | Freq: Once | ORAL | Status: AC
  Administered 2022-07-18: 3.5 mg via ORAL
  Filled 2022-07-18: qty 1

## 2022-07-18 MED ORDER — FUROSEMIDE 10 MG/ML IJ SOLN
80.0000 mg | Freq: Two times a day (BID) | INTRAMUSCULAR | Status: AC
  Administered 2022-07-18 (×2): 80 mg via INTRAVENOUS
  Filled 2022-07-18 (×2): qty 8

## 2022-07-18 MED ORDER — ALTEPLASE 2 MG IJ SOLR
2.0000 mg | Freq: Once | INTRAMUSCULAR | Status: AC
  Administered 2022-07-18: 2 mg

## 2022-07-18 NOTE — Progress Notes (Signed)
Palliative:  HPI: 61 y.o. male  with past medical history of Nonischemic cardiomyopathy diagnosed in 2020, ICD placement, and substance abuse (none since 2020) admitted on 06/27/2022 with shortness of breath.  Patient with acute on chronic heart failure with worsening symptoms.  Patient now being evaluated for LVAD placement.  PMT consulted to assist with LVAD discussions.   I met today with Tim who is resting in bed. Chart reviewed. No family/visitors at bedside. RN is at bedside. I introduced myself from palliative care and he is familiar with our team chaplain, Dorian Pod, who has been following for support. I told Tim that I have been following along but have not actually met him yet as he is always busy with teaching or PT/OT when I have tried to visit. I reiterated the role of palliative for support through his LVAD journey. [name] shares that he is feeling much better and is feeling optimistic at this time. He acknowledges the struggles initially were much more intense than he expected but he is pleased with his progress. He is looking forward to moving out of ICU soon recognizing this is one step closer to home. He is in very good spirits and he looks forward to future visits with our chaplain. I told him that I would update Dorian Pod and ask her to stop in and visit with him sometime over the next few days.   All questions/concerns addressed. Emotional support provided.   Exam: Alert, oriented. Good spirits. No distress. LVAD. VSS. Abd flat. Moves all extremities.   Plan: - Progressing well after LVAD implant. No concerns from palliative standpoint. We will be available as needed.  25 min  Vinie Sill, NP Palliative Medicine Team Pager [phone] (Please see [email or web address] for schedule) Team Phone [phone]    Greater than 50%  of this time was spent counseling and coordinating care related to the above assessment and plan

## 2022-07-18 NOTE — Progress Notes (Signed)
CSW met at bedside with patient and caregiver. Patient states he is managing and trying to stay positive for his recovery. They report having just completed some teaching with the VAD Coordinator and feeling ready for the next step with rehab. Patient appears in good spirits and feeling motivated for continued recovery. CSW continues to follow for supportive needs. Raquel Sarna, [place], [place]

## 2022-07-18 NOTE — Progress Notes (Addendum)
OT Cancellation Note  Patient Details Name: Darryl Frye MRN: [id number] DOB: 1960/12/19   Cancelled Treatment:    Reason Eval/Treat Not Completed: Patient at procedure or test/ unavailable (VAD coordintaor working with family on education upon arrival, OT to continue efforts.)  Addendum 16:06 - Pt politely declining stating he would just like to rest. Agreeable to participate tomorrow.   [name] 07/18/2022, 2:48 PM

## 2022-07-18 NOTE — TOC Benefit Eligibility Note (Signed)
Patient Teacher, English as a foreign language completed.    The patient is currently admitted and upon discharge could be taking sildenafil (Revatio) 20 mg.  Prior Authorization Required  The patient is insured through [place], [place] Patient Advocate Specialist [place] Patient Advocate Team Direct Number: [phone]  Fax: [phone]

## 2022-07-18 NOTE — H&P (Incomplete)
Physical Medicine and Rehabilitation Admission H&P    No chief complaint on file. : HPI: ***  ROS Past Medical History:  Diagnosis Date   Acute kidney injury ([place])    Acute respiratory failure (H[facility])    Atrial flutter (HCC)    on Eliquis   CHF (congestive heart failure) ([place])    EF 20-25% 10/15/21, AICD in place.   Coronary artery disease    Diabetes mellitus without complication (HCC)    DVT (deep venous thrombosis) ([place]) 06/2019   Heart murmur    Hypertension    Presence of permanent cardiac pacemaker    Renal disorder    Past Surgical History:  Procedure Laterality Date   A-FLUTTER ABLATION N/A 01/12/2022   Procedure: A-FLUTTER ABLATION;  Surgeon: Evans Lance, MD;  Location: [place];  Service: Cardiovascular;  Laterality: N/A;   COLONOSCOPY     Dr. Posey Pronto; One 4-6 mm hyperplastic rectal polyp, mid left-sided diverticulosis.   CYST REMOVAL NECK     HERNIA REPAIR     INSERTION OF IMPLANTABLE LEFT VENTRICULAR ASSIST DEVICE N/A 07/04/2022   Procedure: INSERTION OF IMPLANTABLE LEFT VENTRICULAR ASSIST DEVICE; ATRICLIP 23;  Surgeon: Neomia Glass, MD;  Location: [facility];  Service: Open Heart Surgery;  Laterality: N/A;   RIGHT HEART CATH N/A 06/27/2022   Procedure: RIGHT HEART CATH;  Surgeon: Larey Dresser, MD;  Location: [facility];  Service: Cardiovascular;  Laterality: N/A;   RIGHT HEART CATH N/A 06/29/2022   Procedure: RIGHT HEART CATH;  Surgeon: Larey Dresser, MD;  Location: [facility];  Service: Cardiovascular;  Laterality: N/A;   TEE WITHOUT CARDIOVERSION N/A 07/04/2022   Procedure: TRANSESOPHAGEAL ECHOCARDIOGRAM (TEE);  Surgeon: Neomia Glass, MD;  Location: [facility];  Service: Open Heart Surgery;  Laterality: N/A;   TONSILLECTOMY     Family History  Problem Relation Age of Onset   Hyperlipidemia Mother    Heart disease Mother        has PPM   Diabetes Mother    Hypertension Mother    Heart disease Maternal Grandmother     Prostate cancer Maternal Grandfather    Colon cancer Neg Hx    Colon polyps Neg Hx    Social History:  reports that he quit smoking about 3 years ago. His smoking use included cigarettes. He has a 20.00 pack-year smoking history. He has never used smokeless tobacco. He reports that he does not currently use alcohol. He reports that he does not currently use drugs after having used the following drugs: "Crack" cocaine. Allergies: No Known Allergies Medications Prior to Admission  Medication Sig Dispense Refill   apixaban (ELIQUIS) 5 MG TABS tablet Take 5 mg by mouth 2 (two) times daily.     atorvastatin (LIPITOR) 20 MG tablet Take 20 mg by mouth daily.     furosemide (LASIX) 20 MG tablet Take 2 tablets (40 mg total) by mouth daily. (Patient taking differently: Take 40-60 mg by mouth See admin instructions. Taking 3 tabs 60 mg in the AM and 2 tabs ( 40 mg) in the afternoon) 180 tablet 3   insulin glargine (LANTUS) 100 UNIT/ML Solostar Pen Inject 40 Units into the skin daily.     JARDIANCE 25 MG TABS tablet Take 25 mg by mouth daily.     levothyroxine (SYNTHROID) 25 MCG tablet Take 25 mcg by mouth daily before breakfast.     magnesium oxide (MAG-OX) 400 MG tablet Take 400 mg  by mouth 2 (two) times daily.     metFORMIN (GLUCOPHAGE) 1000 MG tablet Take 1,000 mg by mouth 2 (two) times daily with a meal.     metoprolol succinate (TOPROL-XL) 25 MG 24 hr tablet Take 0.5 tablets (12.5 mg total) by mouth daily. 15 tablet 5   sacubitril-valsartan (ENTRESTO) 24-26 MG Take 1 tablet by mouth 2 (two) times daily. 180 tablet 2   sertraline (ZOLOFT) 50 MG tablet Take 50 mg by mouth daily.     spironolactone (ALDACTONE) 25 MG tablet Take 1 tablet (25 mg total) by mouth daily. 90 tablet 2      Home: Home Living Family/patient expects to be discharged to:: Private residence Living Arrangements: Alone Available Help at Discharge: Family, Friend(s), Available 24 hours/day Type of Home: Apartment Home Access:  Elevator Home Layout: One level Bathroom Shower/Tub: Chiropodist: Standard Bathroom Accessibility: Yes Home Equipment: Cane - quad   Functional History: Prior Function Prior Level of Function : Independent/Modified Independent, Driving Mobility Comments: independent with mobility, intermittent use of SPC for ambulation ADLs Comments: independent for IADLS, self care, was working part time up until a few months ago  Functional Status:  Mobility: Bed Mobility Overal bed mobility: Needs Assistance Bed Mobility: Supine to Sit Supine to sit: Min guard Sit to supine: +2 for physical assistance, Mod assist General bed mobility comments: Use of sternal pillow Transfers Overall transfer level: Needs assistance Equipment used: None Transfers: Sit to/from Stand Sit to Stand: Min assist General transfer comment: Rocking to gain momentum, cues for glute activation when upright Ambulation/Gait Ambulation/Gait assistance: Min assist, +2 safety/equipment Gait Distance (Feet): 120 Feet (3 seated rest breaks) Assistive device: Ethelene Hal Gait Pattern/deviations: Step-through pattern, Decreased stride length, Drifts right/left General Gait Details: Improved posture and knee stability, min A overall for balance, close chair follow utilized. Pt requiring 3 sitting rest breaks Gait velocity: decreased Gait velocity interpretation: <1.31 ft/sec, indicative of household ambulator    ADL: ADL Overall ADL's : Needs assistance/impaired Eating/Feeding: Set up, Bed level Grooming: Wash/dry hands, Wash/dry face, Set up, Bed level Upper Body Bathing: Minimal assistance, Bed level Lower Body Bathing: Maximal assistance, Bed level Upper Body Dressing : Maximal assistance, Sitting Upper Body Dressing Details (indicate cue type and reason): LVAD vest and backside gown Lower Body Dressing: Maximal assistance, Bed level Toilet Transfer: Minimal assistance, +2 for physical assistance, +2  for safety/equipment, Ambulation Toilet Transfer Details (indicate cue type and reason): eva walker. simulated. Toileting- Clothing Manipulation and Hygiene: Maximal assistance, Sit to/from stand Functional mobility during ADLs: Minimal assistance, +2 for physical assistance, +2 for safety/equipment General ADL Comments: Pt able to change LVAD power sources with min verbal cues.  Cognition: Cognition Overall Cognitive Status: Within Functional Limits for tasks assessed Orientation Level: Oriented X4 Cognition Arousal/Alertness: Awake/alert Behavior During Therapy: WFL for tasks assessed/performed Overall Cognitive Status: Within Functional Limits for tasks assessed General Comments: very motivated  Physical Exam: Blood pressure (!) 112/93, pulse (!) 126, temperature 98.6 F (37 C), temperature source Oral, resp. rate (!) 21, height _0  (1.803 m), weight 90 kg, SpO2 93 %. Physical Exam  Results for orders placed or performed during the hospital encounter of 06/27/22 (from the past 48 hour(s))  Glucose, capillary     Status: Abnormal   Collection Time: 07/16/22 12:52 PM  Result Value Ref Range   Glucose-Capillary 201 (H) 70 - 99 mg/dL    Comment: Glucose reference range applies only to samples taken after fasting for at least  8 hours.  Glucose, capillary     Status: Abnormal   Collection Time: 07/16/22  4:45 PM  Result Value Ref Range   Glucose-Capillary 241 (H) 70 - 99 mg/dL    Comment: Glucose reference range applies only to samples taken after fasting for at least 8 hours.  Glucose, capillary     Status: Abnormal   Collection Time: 07/16/22 10:27 PM  Result Value Ref Range   Glucose-Capillary 158 (H) 70 - 99 mg/dL    Comment: Glucose reference range applies only to samples taken after fasting for at least 8 hours.  Lactate dehydrogenase     Status: Abnormal   Collection Time: 07/17/22  4:45 AM  Result Value Ref Range   LDH 243 (H) 98 - 192 U/L    Comment: Performed at  [facility], [place] [address], [place], [place] [address]  Cooxemetry Panel (carboxy, met, total hgb, O2 sat)     Status: Abnormal   Collection Time: 07/17/22  4:45 AM  Result Value Ref Range   Total hemoglobin 7.8 (L) 12.0 - 16.0 g/dL   O2 Saturation 66.5 %   Carboxyhemoglobin 2.6 (H) 0.5 - 1.5 %   Methemoglobin 0.9 0.0 - 1.5 %    Comment: Performed at [facility] [address] [place], [place] [address]  Basic metabolic panel     Status: Abnormal   Collection Time: 07/17/22  4:45 AM  Result Value Ref Range   Sodium 128 (L) 135 - 145 mmol/L   Potassium 4.4 3.5 - 5.1 mmol/L   Chloride 91 (L) 98 - 111 mmol/L   CO2 24 22 - 32 mmol/L   Glucose, Bld 164 (H) 70 - 99 mg/dL    Comment: Glucose reference range applies only to samples taken after fasting for at least 8 hours.   BUN 25 (H) 6 - 20 mg/dL   Creatinine, Ser 1.34 (H) 0.61 - 1.24 mg/dL   Calcium 8.6 (L) 8.9 - 10.3 mg/dL   GFR, Estimated >60 >60 mL/min    Comment: (NOTE) Calculated using the CKD-EPI Creatinine Equation (2021)    Anion gap 13 5 - 15    Comment: Performed at [facility] [address] [place], [place] [address]  Protime-INR     Status: Abnormal   Collection Time: 07/17/22  4:45 AM  Result Value Ref Range   Prothrombin Time 24.2 (H) 11.4 - 15.2 seconds   INR 2.2 (H) 0.8 - 1.2    Comment: (NOTE) INR goal varies based on device and disease states. Performed at [facility], [place] [address] [place], [place] [address]   CBC     Status: Abnormal   Collection Time: 07/17/22  4:45 AM  Result Value Ref Range   WBC 13.2 (H) 4.0 - 10.5 K/uL   RBC 2.73 (L) 4.22 - 5.81 MIL/uL   Hemoglobin 7.6 (L) 13.0 - 17.0 g/dL   HCT 23.1 (L) 39.0 - 52.0 %   MCV 84.6 80.0 - 100.0 fL   MCH 27.8 26.0 - 34.0 pg   MCHC 32.9 30.0 - 36.0 g/dL   RDW 16.0 (H) 11.5 - 15.5 %   Platelets 551 (H) 150 - 400 K/uL   nRBC 0.2 0.0 - 0.2 %    Comment: Performed at [facility] [address], [place],  [place] [address]  Glucose, capillary     Status: Abnormal   Collection Time: 07/17/22  7:34 AM  Result Value Ref  Range   Glucose-Capillary 203 (H) 70 - 99 mg/dL    Comment: Glucose reference range applies only to samples taken after fasting for at least 8 hours.  Type and screen [place]     Status: None   Collection Time: 07/17/22  8:00 AM  Result Value Ref Range   ABO/RH(D) O POS    Antibody Screen NEG    Sample Expiration 07/20/2022,2359    Unit Number [id number]    Blood Component Type RED CELLS,LR    Unit division 00    Status of Unit ISSUED,FINAL    Transfusion Status OK TO TRANSFUSE    Crossmatch Result      Compatible Performed at [facility], [place] [address], [place], [place] [address]   Prepare RBC (crossmatch)     Status: None   Collection Time: 07/17/22  8:01 AM  Result Value Ref Range   Order Confirmation      ORDER PROCESSED BY BLOOD BANK Performed at [facility], [place] [address], [place], [place] [address]   Glucose, capillary     Status: Abnormal   Collection Time: 07/17/22 11:12 AM  Result Value Ref Range   Glucose-Capillary 186 (H) 70 - 99 mg/dL    Comment: Glucose reference range applies only to samples taken after fasting for at least 8 hours.  Glucose, capillary     Status: Abnormal   Collection Time: 07/17/22  3:35 PM  Result Value Ref Range   Glucose-Capillary 196 (H) 70 - 99 mg/dL    Comment: Glucose reference range applies only to samples taken after fasting for at least 8 hours.  Glucose, capillary     Status: Abnormal   Collection Time: 07/17/22  9:53 PM  Result Value Ref Range   Glucose-Capillary 284 (H) 70 - 99 mg/dL    Comment: Glucose reference range applies only to samples taken after fasting for at least 8 hours.  Brain natriuretic peptide     Status: Abnormal   Collection Time: 07/18/22 12:44 AM  Result Value Ref Range   B Natriuretic Peptide 438.1 (H) 0.0 - 100.0 pg/mL    Comment: Performed at [facility] [address], [place], [place] [address]  Lactate dehydrogenase     Status: Abnormal   Collection Time: 07/18/22  5:31 AM  Result Value Ref Range   LDH 245 (H) 98 - 192 U/L    Comment: Performed at [facility], [place] [address] [place], [place] [address]  Basic metabolic panel     Status: Abnormal   Collection Time: 07/18/22  5:31 AM  Result Value Ref Range   Sodium 128 (L) 135 - 145 mmol/L   Potassium 4.2 3.5 - 5.1 mmol/L   Chloride 91 (L) 98 - 111 mmol/L   CO2 26 22 - 32 mmol/L   Glucose, Bld 138 (H) 70 - 99 mg/dL    Comment: Glucose reference range applies only to samples taken after fasting for at least 8 hours.   BUN 29 (H) 6 - 20 mg/dL   Creatinine, Ser 1.28 (H) 0.61 - 1.24 mg/dL   Calcium 8.7 (L) 8.9 - 10.3 mg/dL   GFR, Estimated >60 >60 mL/min    Comment: (NOTE) Calculated using the CKD-EPI Creatinine Equation (2021)    Anion gap 11 5 - 15    Comment: Performed at [facility] [address], [place], [place] [address]  Protime-INR     Status: Abnormal  Collection Time: 07/18/22  5:31 AM  Result Value Ref Range   Prothrombin Time 21.2 (H) 11.4 - 15.2 seconds   INR 1.9 (H) 0.8 - 1.2    Comment: (NOTE) INR goal varies based on device and disease states. Performed at [facility], [place] [address] [place], [place] [address]   CBC     Status: Abnormal   Collection Time: 07/18/22  5:31 AM  Result Value Ref Range   WBC 11.7 (H) 4.0 - 10.5 K/uL   RBC 3.03 (L) 4.22 - 5.81 MIL/uL   Hemoglobin 8.3 (L) 13.0 - 17.0 g/dL   HCT 26.0 (L) 39.0 - 52.0 %   MCV 85.8 80.0 - 100.0 fL   MCH 27.4 26.0 - 34.0 pg   MCHC 31.9 30.0 - 36.0 g/dL   RDW 15.9 (H) 11.5 - 15.5 %   Platelets 595 (H) 150 - 400 K/uL   nRBC 0.0 0.0 - 0.2 %    Comment: Performed at [facility] [address], [place], [place] [address]  Cooxemetry Panel (carboxy, met, total hgb, O2 sat)     Status: Abnormal   Collection Time: 07/18/22  5:41 AM  Result Value Ref Range   Total  hemoglobin 8.0 (L) 12.0 - 16.0 g/dL   O2 Saturation 90.2 %   Carboxyhemoglobin 2.5 (H) 0.5 - 1.5 %   Methemoglobin 0.8 0.0 - 1.5 %    Comment: Performed at [facility] [address], [place], [place] [address]  Glucose, capillary     Status: Abnormal   Collection Time: 07/18/22  5:53 AM  Result Value Ref Range   Glucose-Capillary 136 (H) 70 - 99 mg/dL    Comment: Glucose reference range applies only to samples taken after fasting for at least 8 hours.  Cooxemetry Panel (carboxy, met, total hgb, O2 sat)     Status: Abnormal   Collection Time: 07/18/22  7:33 AM  Result Value Ref Range   Total hemoglobin 8.6 (L) 12.0 - 16.0 g/dL   O2 Saturation 51.2 %   Carboxyhemoglobin 1.6 (H) 0.5 - 1.5 %   Methemoglobin <0.7 0.0 - 1.5 %    Comment: Performed at [facility] [address] [place], [place] [address]  Glucose, capillary     Status: Abnormal   Collection Time: 07/18/22 11:26 AM  Result Value Ref Range   Glucose-Capillary 287 (H) 70 - 99 mg/dL    Comment: Glucose reference range applies only to samples taken after fasting for at least 8 hours.   DG CHEST PORT 1 VIEW  Result Date: 07/17/2022 CLINICAL DATA:  Congestive heart failure. Short of breath. Left ventricular assist device. EXAM: PORTABLE CHEST 1 VIEW COMPARISON:  07/15/2022 and older exams.  CT, 07/12/2022. FINDINGS: Stable bilateral coarse irregular interstitial opacities and intervening hazy airspace opacities. No pneumothorax. No new lung abnormality. No change in the position of the left ventricular assist device. Stable left anterior chest wall AICD and right sided PICC. IMPRESSION: 1. No change from the most recent prior exam. Persistent interstitial hazy airspace lung opacities suspected to be infectious/inflammatory in etiology. 2. Electronically Signed   By: Lajean Manes M.D.   On: 07/17/2022 08:19      Blood pressure (!) 112/93, pulse (!) 126, temperature 98.6 F (37 C), temperature source Oral, resp.  rate (!) 21, height _0  (1.803 m), weight 90 kg, SpO2 93 %.  Medical Problem List and Plan: 1. Functional deficits secondary to ***  -  patient may *** shower  -ELOS/Goals: *** 2.  Antithrombotics: -DVT/anticoagulation:  {VTE PROPHYLAXIS/ANTICOAGULATION - [id number]}  -antiplatelet therapy: *** 3. Pain Management: *** 4. Mood/Behavior/Sleep: ***  -antipsychotic agents: *** 5. Neuropsych/cognition: This patient *** capable of making decisions on *** own behalf. 6. Skin/Wound Care: *** 7. Fluids/Electrolytes/Nutrition: ***     ***  Bary Leriche, PA-C 07/18/2022

## 2022-07-18 NOTE — Progress Notes (Addendum)
TCTS Progress Note:  2 weeks s/p LVAD placement  Likely to stepdown tomorrow Is on nasal 02 today Still has urine bag Ordered 3.5mg  coumadin today given 1.9, that has been mostly on 2.5mg  Bet he ends up at 3mg  stable.   Likely another unit of blood tomorrow  Awake interactive [place] in place Abd soft ntnd Extr wwp     Latest Ref Rng & Units 07/18/2022    5:31 AM 07/17/2022    4:45 AM 07/16/2022    5:55 AM  CBC  WBC 4.0 - 10.5 K/uL 11.7  13.2  15.1   Hemoglobin 13.0 - 17.0 g/dL 8.3  7.6  7.8   Hematocrit 39.0 - 52.0 % 26.0  23.1  24.6   Platelets 150 - 400 K/uL 595  551  534        Latest Ref Rng & Units 07/18/2022    5:31 AM 07/17/2022    4:45 AM 07/16/2022    5:55 AM  CMP  Glucose 70 - 99 mg/dL 138  164  149   BUN 6 - 20 mg/dL 29  25  23    Creatinine 0.61 - 1.24 mg/dL 1.28  1.34  1.22   Sodium 135 - 145 mmol/L 128  128  129   Potassium 3.5 - 5.1 mmol/L 4.2  4.4  3.7   Chloride 98 - 111 mmol/L 91  91  88   CO2 22 - 32 mmol/L 26  24  26    Calcium 8.9 - 10.3 mg/dL 8.7  8.6  8.8     ABG    Component Value Date/Time   PHART 7.557 (H) 07/14/2022 0513   PCO2ART 37.1 07/14/2022 0513   PO2ART 113 (H) 07/14/2022 0513   HCO3 33.1 (H) 07/14/2022 0513   TCO2 34 (H) 07/14/2022 0513   ACIDBASEDEF 2.0 07/07/2022 0510   O2SAT 52 07/18/2022 1533

## 2022-07-18 NOTE — Progress Notes (Signed)
Patient ID: Darryl Frye, male   DOB: 05/23/1961, 61 y.o.   MRN: [id number]  TCTS Evening Rounds:  Hemodynamically stable today.  VAD flows stable.  Co-ox 52% unchanged from this am.  Good urine output today.  Eating well.

## 2022-07-18 NOTE — Progress Notes (Signed)
   NAME:  Darryl Frye, MRN:  [id number], DOB:  [birth year], LOS: 21 ADMISSION DATE:  06/27/2022, CONSULTATION DATE:  11/14 REFERRING MD:  Aundra Dubin, CHIEF COMPLAINT:  critical care support s/p LVAD   History of Present Illness:  61 year old male w/ sig h/o NICM (EF 20-25%), af/flutter. Presented 11/14 for LVAD after failing out-pt medical therapies. Initial flow 4.5 l/min speed 5100 power 3.6 P1 2.3  Pertinent  Medical History  Afib/flutter s/p ablation May 2023 NICM EF 20-25% has ICD Remote h/o heavy ETOH and cocaine Class IV NYHF sxs.   Significant Hospital Events: Including procedures, antibiotic start and stop dates in addition to other pertinent events   11/14 LVAD placed. Returned to ICU full vent support. On iNO 20ppm, Returned to ICU post-op intubated on Neo, epi 6, and amio gtt. Initial hemodynamics PA 34/20 CO 4.9 CI 2.4 co ox 70% l CVP 10-. On full vent support. PCCM asked to assist w/ care  11/15 iNO weaned off. Got lasix. Pump speed increased to 5200. Still on epi and milrione. Awake following commands 11/15 attempted SBT but saturations dropped 11/16 extubated  11/17 mobilizations, remains on pressors  11/22  nasal packing by ENT 11/27 nasal packing removed  Interim History / Subjective:  Nasal packing removed. Doing well. On LVAD 4.8  Objective   Blood pressure (!) 112/93, pulse (!) 126, temperature 98.6 F (37 C), temperature source Oral, resp. rate (!) 21, height 5\' 11"  (1.803 m), weight 90 kg, SpO2 93 %. CVP:  [5 mmHg-21 mmHg] 21 mmHg      Intake/Output Summary (Last 24 hours) at 07/18/2022 1214 Last data filed at 07/18/2022 0700 Gross per 24 hour  Intake 1616.94 ml  Output 2550 ml  Net -933.06 ml    Filed Weights   07/16/22 0500 07/17/22 0500 07/18/22 0500  Weight: 88.2 kg 88.9 kg 90 kg   Examination: General: Middle-age male, sitting up in bed, comfortable HENT: NCAT, tracking appropriately Heart: Hum from LVAD, midline sternal incision Pulm: Faint  basilar crackles Abd: Soft nontender nondistended Ext: Warm dry no edema Neuro: Alert, awake, following commands Skin: No rash  Assessment & Plan:   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  Left nare epistaxis - s/p cautery and packing 11/22, packing removed 11/27. - No further interventions required.  Acute Hypoxemic Respiratory Failure - 2/2 bilateral multifocal pneumonia (sputum culture positive for Capnocytophaga) - Continue supplemental O2 as needed to maintain SpO2 > 92%. - Continue Zosyn for total 7 days (stop date 11/29). - Bronchial hygiene. - Lasix per cards. - Push IS - Mobilize  H/o afib/flutter s/p prior ablation - Continue Amio, Coumadin, Dig.  H/o DVT Continue Coumadin.   Best Practice (right click and "Reselect all SmartList Selections" daily)   Diet/type: 2 g sodium diet DVT prophylaxis: Coumadin GI prophylaxis: Protonix Lines: Central line Foley:  Yes, and it is still needed Code Status:  limited Last date of multidisciplinary goals of care discussion [Per primary team]   CC time: N/A.  Montey Hora, [facility] For pager details, please see AMION or use Epic chat  After 1900, please call [facility] for cross coverage needs 07/18/2022, 12:14 PM

## 2022-07-18 NOTE — Progress Notes (Signed)
LVAD Coordinator Rounding Note:  HM 3 LVAD implanted on 07/04/22 by Dr Tenny Craw under destination criteria due to uncontrolled diabetes. Left atrial appendage clipped at time of surgery.  Pt sitting up in bed upon my arrival. States he is feeling better this morning. He walked in the hallway this morning with minimal rest breaks.   Plan for CIR. Will need VAD education completed prior to admission to CIR. Met at bedside this afternoon with pt's caregiver for further dressing change and VAD education. See documentation below.   Vital signs: Temp: 99.5 HR: 83 NSR Doppler Pressure: not documented Arterial BP: 112/93 (100) O2 Sat: 93% on 3L [place] Wt: 214.3>208.8>209.2>207.2>206.6>205.3>200>197.1>195.9>198.4 lb  LVAD interrogation reveals:  Speed: 5400 Flow: 4.5 Power: 3.9 w PI: 4.0   Alarms: none Events: none Hematocrit: 26  Fixed speed: 5300 Low speed limit: 5000   Drive Line:  Dressing change performed by caregiver [name] with VAD Coordinator observing with occasional verbal cues. Existing VAD dressing removed and site care performed using sterile technique. Drive line exit site cleaned with Chlora prep applicators x 2, allowed to dry, and gauze dressing with silver strip applied. Exit site partially incorporated, the velour is fully implanted at exit site. 1 blue sutures, and 1 black suture in place. Slight bruising at exit site. Scant amount of dried serous drainage on previous dressing/silver strip. No redness, tenderness, or foul odor. Drive line anchor re-applied. Next dressing change 07/20/22 to be performed by nurse champion, [place] coordinator, or trained caregiver.    Labs:  LDH trend: 326>400>462>422>387>388>368>354>243>245  INR trend: 1.1>1.5>1.4>1.5>3.0>2.4>2.2>2.2>2.2>1.9  WBC trend: 12.6>18>20.9>17.4>11.5>11.0>15.1>16.4>13.2>11.7  Anticoagulation Plan: -INR Goal: 2.0 - 2.5 -ASA Dose: 54m   Blood Products:  IntraOp 11/14:  - 4 FFP  - DDAVP - 617 cell  saver  PostOp 11/14: - 2 FFP  07/17/22: - 1 PRBC  Device: -Medtronic -Therapies: OFF  Arrythmias: AFIB RVR 11/17. Amiodarone transitioned to p.o. 07/12/22  Respiratory: Extubated 07/06/22  Renal:  -BUN:25>18>20>26>27>25>17>25>29 -CRT:1.28>1.23>1.44>1.46>1.47>1.27>1.1>1.34>1.28  Drips:  Infection: 07/07/22>> blood cultures>> no growth 5 days; final 07/07/22>> expectorated sputum>>moderate capnocutophaga; final     Pt Education: Dressing change performed by JJanett Billowwith VAD coordinator supervision. Occasional verbal cues needed. Will plan to observe caregiver performing next dressing change.  Reviewed controller buttons and functions. Pt demonstrated self test. Reviewed how to check last 6 alarms.  Reviewed drive line modular cable connection (assess for yellow line). Discussed need to keep drive line securely anchored at all times.  Reviewed bCharity fundraiserfunction(s), weekly maintenance, and how to calibrate batteries. Discussed need to rotate batteries weekly Reviewed MPU functions, advisory lights, and changing AA batteries every 6 months.  Reviewed need to bring black back up bag everywhere. Reviewed contents of black bag (backup controller, clips, and 2 fully charged batteries). Discussed not to leave bag in the car.  Discussed that he may not shower until exit site completely healed and given permission by VAD coordinators.  Had pt and JJanett Billowprogram VAD clinic and VAD pager numbers into their phones. Both successfully test paged the VAD pager.  Discussed need for antibiotics prior to dental cleanings.  Discussed that he may see PCP. Discussed if PCP wants to start/change medications he needs to notify VAD team prior to starting/stopping medications Discussed taking Coumadin, Vitamin K rich foods (eating consistently), INR checks, possibility of home INR machine in the future pending insurance approval, and risks of bleeding on blood thinner. Stressed importance of  notifying VAD coordinators if he falls / hits his head.  Discussed how to manage nosebleeds at home Reviewed cough/cold medications he may use (list provided in binder) Discussed when to call VAD coordinator (see pt discharge binder list)                                               Plan/Recommendations:  1. Page VAD coordinator for equipment or drive line issues. 2. Continue every other day drive line dressing changes per VAD coordinator, nurse champion, or trained caregiver.  Emerson Monte RN [place] Coordinator  Office: [phone]  24/7 Pager: [phone]

## 2022-07-18 NOTE — Progress Notes (Addendum)
Patient ID: Darryl Frye, male   DOB: [birth year], 61 y.o.   MRN: [id number] Advanced Heart Failure VAD Team Note  PCP-Cardiologist: Carlyle Dolly, MD   Subjective:    - 11/13 S/P HMIII L atrial appendage closure.  - 11/16 Extubated . GIven 40 mg IV and started on lasix drip 10 mg per/hr.  - 11/17 Developed A fib RVR. Given bolus 150 x 2. Unchanged.  - 11/18 Bedside echo 11/18 LV small. VAD cannula well aligned. AoV opening every beat. Septum pulling to left RV moderately HK. Small to moderate anterior effusion - 11/22 Ramp echo - 11/22 Nasal cautery and packing by ENT. Ramp echo. Speed increased to 5400.  CT chest suggestive of patchy multifocal PNA and atelectasis.   Nasal packing removed. No further bleeding. Hgb 7.6>>8.3 post transfusion yesterday.   Off milrinone, CO-OX resulted 90%, doubt accurate. Repeat pending. CVP higher today, 13-14. Wt up 3 lb. On 4L [place].   Scr stable 1.28.   Sputum culture + for capnocytophaga. CCM switched to zosyn given persistent infiltrates on CXR.   MAPs 70s   Feels ok. Just had BM. Appetite is good.   LVAD INTERROGATION:  HeartMate III LVAD:   Flow 4.8 liters/min, speed 5450, power 4.1 PI 3.2. no PI events LDH stable 271 => 258 => 243=>245 INR 1.9  Objective:    Vital Signs:   Temp:  [97.7 F (36.5 C)-99.5 F (37.5 C)] 99.5 F (37.5 C) (11/28 0550) Pulse Rate:  [82-158] 106 (11/28 0200) Resp:  [14-36] 17 (11/28 0200) BP: (86-130)/(19-104) 93/73 (11/28 0200) SpO2:  [87 %-100 %] 97 % (11/28 0200) FiO2 (%):  [40 %] 40 % (11/27 0916) Weight:  [90 kg] 90 kg (11/28 0500) Last BM Date : 07/17/22 Mean arterial Pressure 70s  Intake/Output:   Intake/Output Summary (Last 24 hours) at 07/18/2022 7322 Last data filed at 07/18/2022 0000 Gross per 24 hour  Intake 1546.91 ml  Output 2370 ml  Net -823.09 ml     Physical Exam   CVP 16 General: fatigued appearing, sitting up in chair, wearing [place] Ward  HEENT: normal  Neck: Supple, JVP 12 cm.  Carotids OK.  Cardiac:  Mechanical heart sounds with LVAD hum present.  Lungs:  clear Abdomen:  NT, ND, no HSM. No bruits or masses. +BS  LVAD exit site: Well-healed and incorporated. Dressing dry and intact. No erythema or drainage. Stabilization device present and accurately applied. Driveline dressing changed daily per sterile technique. Extremities:  Warm and dry. No cyanosis, clubbing, rash, or edema.  Neuro:  Alert & oriented x 3. Cranial nerves grossly intact. Moves all 4 extremities w/o difficulty. Affect pleasant    Telemetry   Sinus 80-90 Personally reviewed   Labs   Basic Metabolic Panel: Recent Labs  Lab 07/12/22 0435 07/12/22 0533 07/14/22 0400 07/14/22 0513 07/15/22 0430 07/16/22 0555 07/17/22 0445 07/18/22 0531  NA 130*   < > 133* 129* 130* 129* 128* 128*  K 4.3   < > 3.9 4.0 3.8 3.7 4.4 4.2  CL 88*   < > 88*  --  88* 88* 91* 91*  CO2 29   < > 30  --  27 26 24 26   GLUCOSE 223*   < > 96  --  174* 149* 164* 138*  BUN 32*   < > 25*  --  25* 23* 25* 29*  CREATININE 1.21   < > 1.22  --  1.40* 1.22 1.34* 1.28*  CALCIUM 8.6*   < >  8.7*  --  8.7* 8.8* 8.6* 8.7*  MG 2.1  --   --   --  2.4  --   --   --    < > = values in this interval not displayed.    Liver Function Tests: No results for input(s): "AST", "ALT", "ALKPHOS", "BILITOT", "PROT", "ALBUMIN" in the last 168 hours. No results for input(s): "LIPASE", "AMYLASE" in the last 168 hours. No results for input(s): "AMMONIA" in the last 168 hours.  CBC: Recent Labs  Lab 07/14/22 0942 07/15/22 0430 07/16/22 0555 07/17/22 0445 07/18/22 0531  WBC 16.6* 15.9* 15.1* 13.2* 11.7*  HGB 8.0* 7.9* 7.8* 7.6* 8.3*  HCT 23.9* 24.6* 24.6* 23.1* 26.0*  MCV 82.7 84.5 85.7 84.6 85.8  PLT 462* 517* 534* 551* 595*    INR: Recent Labs  Lab 07/14/22 0400 07/15/22 0430 07/16/22 0555 07/17/22 0445 07/18/22 0531  INR 2.0* 2.2* 2.3* 2.2* 1.9*  :    Imaging   DG CHEST PORT 1 VIEW  Result Date:  07/17/2022 CLINICAL DATA:  Congestive heart failure. Short of breath. Left ventricular assist device. EXAM: PORTABLE CHEST 1 VIEW COMPARISON:  07/15/2022 and older exams.  CT, 07/12/2022. FINDINGS: Stable bilateral coarse irregular interstitial opacities and intervening hazy airspace opacities. No pneumothorax. No new lung abnormality. No change in the position of the left ventricular assist device. Stable left anterior chest wall AICD and right sided PICC. IMPRESSION: 1. No change from the most recent prior exam. Persistent interstitial hazy airspace lung opacities suspected to be infectious/inflammatory in etiology. 2. Electronically Signed   By: Lajean Manes M.D.   On: 07/17/2022 08:19     Medications:     Scheduled Medications:  sodium chloride   Intravenous Once   amiodarone  200 mg Oral BID   aspirin  81 mg Oral Daily   atorvastatin  20 mg Oral Daily   bisacodyl  10 mg Oral Daily   Or   bisacodyl  10 mg Rectal Daily   Chlorhexidine Gluconate Cloth  6 each Topical Daily   digoxin  0.125 mg Oral Daily   Fe Fum-Vit C-Vit B12-FA  1 capsule Oral QPC breakfast   feeding supplement  237 mL Oral TID BM   [START ON 07/19/2022] influenza vac split quadrivalent PF  0.5 mL Intramuscular Tomorrow-1000   insulin aspart  0-24 Units Subcutaneous TID WC & HS   insulin glargine-yfgn  22 Units Subcutaneous QHS   insulin glargine-yfgn  25 Units Subcutaneous Daily   ipratropium-albuterol  3 mL Nebulization TID   levothyroxine  25 mcg Oral Q0600   lidocaine  2 patch Transdermal Q24H   magnesium oxide  400 mg Oral BID   melatonin  3 mg Oral QHS   multivitamin with minerals  1 tablet Oral Daily   pantoprazole  40 mg Oral Daily   [START ON 07/19/2022] pneumococcal 20-valent conjugate vaccine  0.5 mL Intramuscular Tomorrow-1000   polyethylene glycol  17 g Oral BID   senna-docusate  1 tablet Oral BID   sertraline  50 mg Oral Daily   sildenafil  20 mg Oral TID   sodium chloride flush  10-40 mL  Intracatheter Q12H   sodium chloride flush  10-40 mL Intracatheter Q12H   sodium chloride flush  3 mL Intravenous Q12H   sodium chloride flush  3 mL Intravenous Q12H   spironolactone  25 mg Oral Daily   torsemide  40 mg Oral BID   Warfarin - Physician Dosing Inpatient   Does not  apply q1600    Infusions:  sodium chloride Stopped (07/07/22 1346)   sodium chloride     sodium chloride Stopped (07/17/22 2146)   lactated ringers     lactated ringers Stopped (07/04/22 2207)   lactated ringers Stopped (07/14/22 0801)   piperacillin-tazobactam (ZOSYN)  IV 3.375 g (07/18/22 0639)    PRN Medications: sodium chloride, acetaminophen, ALPRAZolam, dextrose, hydrALAZINE, lactated ringers, morphine injection, ondansetron (ZOFRAN) IV, ondansetron (ZOFRAN) IV, mouth rinse, oxyCODONE, sodium chloride flush, sodium chloride flush, sodium chloride flush, traMADol, tranexamic acid   Patient Profile   Admitted with A/C HFrEF --> cardiogenic shock. S/P HMIII LVAD  Assessment/Plan:    1. Acute on chronic systolic CHF/cardiogenic shock: Nonischemic cardiomyopathy, diagnosed 2020.  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  Cath in 12/20 with no significant coronary disease.  Medtronic ICD. Most recent echo in 2/23 showed EF 20-25% with normal RV.  Recently, patient has been symptomatically worse, NYHA class IV with profound orthopnea. RHC showed markedly elevated filling pressures, primarily pulmonary venous hypertension, low cardiac output, and low PAPI.  Patient was admitted for inotrope and diuresis. Echo this admission with EF <20%, mod RV dysfunction.  He was started on milrinone with improvement in hemodynamics.  We proceeded with HM3 LVAD with LAA clip on 11/14.  He is now off milrinone and remains on sildenafil 20 tid.  Co-ox  pending. CVP 16. Wt up 3 lb. Remains on 4L [place]. Creatinine stable. Good flows on LVAD.  - Plan IV Lasix today, 80 mg bid  - Continue sildenafil 20 tid.  - Continue 25 mg spiro daily.  - Continue digoxin 0.125 daily.  2. A fib RVR, post-op: H/O S/p flutter ablation.  Remains in NSR.  - Continue po amiodarone 200 BID - Continue warfarin.  3. VAD management: s/p HM-3 VAD on 11/14.  Bedside echo 11/18 LV small, VAD cannula well aligned, AoV opening every beat, septum pulling to left RV moderately HK, small to moderate anterior effusion. Ramp echo 11/22 RV mild to mod HK. Speed turned to 5400. LDH 245. MAP 70s - Now on warfarin with INR 1.9 - ASA 81. - VAD interrogated personally. Parameters stable. 4. H/o DVT: On anticoagulation.  5. Acute hypoxemic respiratory failure, post-op. 11/16 extubated.  Bilateral lower lobe infiltrates on CXR. CVP trending down but CXR has not improved, still with bilateral infiltrates and significant oxygen requirement.  He has been on amiodarone this admission for atrial fibrillation, was not on amiodarone before this hospitalization.  Will need to think about amiodarone lung toxicity, discussed with Dr Tacy Learn who does not think particularly likely but will need to monitor for improvement.  It is also possible that he aspirated.  Sputum culture + for capnocytophaga. CCM switched to zosyn given persistent infiltrates on CXR.CT chest w/o contrast. CT chest 11/22 with diffuse interstitial and patchy alveolar opacities. He continues to have significant oxygen requirement.  - Encourage hourly incentive spirometry. - Encourage mobility.  - Continue Zosyn x 1 week.  6. AKI: Creatinine stable at 1.28. - Diuretic management as above 7. ID: Afebrile, WBCs 13. He completed initial vanc/cefepime empiric abx for ?post-op PNA. 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 CCM switched to zosyn given persistent infiltrates on imaging - Zosyn x 1 week.  8. Hypothyroidsim - On  levothyroxine.  9. Hypokalemia -  Continue spironolactone.  10. Epistaxis: s/p cauterization/packing 11/22 - remove packing today.  11. Anemia: Hgb 7.9 => 7.8 => 7.6=>1uRBCs=>8.3.  12. Hyponatremia: Na 128. Fluid restrict.  13. Deconditioning - PT/OT following  - CIR  Brittainy Ladoris Gene  07/18/2022 7:12 AM  Patient seen with PA, agree with the above note.   He seems to be doing well today.  Walked around unit last night with 1 stop.  Down to 4L [place].  Good appetite.   Co-ox not accurate. CVP a bit higher at 13-14.  He had 1 unit PRBCs yesterday, hgb 8.3.  Creatinine stable 1.28.   General: Well appearing this am. NAD.  HEENT: Normal. Neck: Supple, JVP 12-14 cm. Carotids OK.  Cardiac:  Mechanical heart sounds with LVAD hum present.  Lungs:  CTAB, normal effort.  Abdomen:  NT, ND, no HSM. No bruits or masses. +BS  LVAD exit site: Well-healed and incorporated. Dressing dry and intact. No erythema or drainage. Stabilization device present and accurately applied. Driveline dressing changed daily per sterile technique. Extremities:  Warm and dry. No cyanosis, clubbing, rash, or edema.  Neuro:  Alert & oriented x 3. Cranial nerves grossly intact. Moves all 4 extremities w/o difficulty. Affect pleasant    Stable LVAD parameters.   Repeat co-ox.   Lasix 80 mg IV bid x 2 doses today and replace K.   Continue to titrate down oxygen as tolerated.  Remains on Zosyn for PNA (7 day course).   Continue to mobilize.  Think he should be able to leave the unit soon.   Loralie Champagne 07/18/2022 7:38 AM

## 2022-07-18 NOTE — Progress Notes (Signed)
Inpatient Rehab Admissions Coordinator:    Patient has insurance approval for CIR admission. MD would like to watch today and plan to admit to CIR tomorrow. Will continue to follow.   Rehab Admissons Coordinator [place], [place], [place] [phone]

## 2022-07-19 ENCOUNTER — Other Ambulatory Visit (HOSPITAL_COMMUNITY): Payer: Self-pay

## 2022-07-19 ENCOUNTER — Telehealth (HOSPITAL_COMMUNITY): Payer: Self-pay | Admitting: Pharmacy Technician

## 2022-07-19 LAB — PROTIME-INR
INR: 1.9 — ABNORMAL HIGH (ref 0.8–1.2)
Prothrombin Time: 21.5 seconds — ABNORMAL HIGH (ref 11.4–15.2)

## 2022-07-19 LAB — CBC
HCT: 25.6 % — ABNORMAL LOW (ref 39.0–52.0)
Hemoglobin: 8.2 g/dL — ABNORMAL LOW (ref 13.0–17.0)
MCH: 27.6 pg (ref 26.0–34.0)
MCHC: 32 g/dL (ref 30.0–36.0)
MCV: 86.2 fL (ref 80.0–100.0)
Platelets: 568 10*3/uL — ABNORMAL HIGH (ref 150–400)
RBC: 2.97 MIL/uL — ABNORMAL LOW (ref 4.22–5.81)
RDW: 15.9 % — ABNORMAL HIGH (ref 11.5–15.5)
WBC: 9.6 10*3/uL (ref 4.0–10.5)
nRBC: 0.2 % (ref 0.0–0.2)

## 2022-07-19 LAB — BASIC METABOLIC PANEL
Anion gap: 11 (ref 5–15)
BUN: 26 mg/dL — ABNORMAL HIGH (ref 6–20)
CO2: 27 mmol/L (ref 22–32)
Calcium: 8.4 mg/dL — ABNORMAL LOW (ref 8.9–10.3)
Chloride: 91 mmol/L — ABNORMAL LOW (ref 98–111)
Creatinine, Ser: 1.11 mg/dL (ref 0.61–1.24)
GFR, Estimated: >60 mL/min (ref >60)
Glucose, Bld: 79 mg/dL (ref 70–99)
Potassium: 3.6 mmol/L (ref 3.5–5.1)
Sodium: 129 mmol/L — ABNORMAL LOW (ref 135–145)

## 2022-07-19 LAB — GLUCOSE, CAPILLARY
Glucose-Capillary: 104 mg/dL — ABNORMAL HIGH (ref 70–99)
Glucose-Capillary: 314 mg/dL — ABNORMAL HIGH (ref 70–99)
Glucose-Capillary: 239 mg/dL — ABNORMAL HIGH (ref 70–99)
Glucose-Capillary: 226 mg/dL — ABNORMAL HIGH (ref 70–99)

## 2022-07-19 LAB — COOXEMETRY PANEL
Carboxyhemoglobin: 2.1 % — ABNORMAL HIGH (ref 0.5–1.5)
Methemoglobin: 0.8 % (ref 0.0–1.5)
O2 Saturation: 64.4 %
Total hemoglobin: 8.6 g/dL — ABNORMAL LOW (ref 12.0–16.0)

## 2022-07-19 LAB — LACTATE DEHYDROGENASE: LDH: 228 U/L — ABNORMAL HIGH (ref 98–192)

## 2022-07-19 MED ORDER — POTASSIUM CHLORIDE CRYS ER 20 MEQ PO TBCR
40.0000 meq | EXTENDED_RELEASE_TABLET | ORAL | Status: AC
  Administered 2022-07-19 (×2): 40 meq via ORAL
  Filled 2022-07-19 (×2): qty 2

## 2022-07-19 MED ORDER — FUROSEMIDE 10 MG/ML IJ SOLN
80.0000 mg | Freq: Two times a day (BID) | INTRAMUSCULAR | Status: AC
  Administered 2022-07-19 (×2): 80 mg via INTRAVENOUS
  Filled 2022-07-19 (×2): qty 8

## 2022-07-19 MED ORDER — EMPAGLIFLOZIN 25 MG PO TABS
25.0000 mg | ORAL_TABLET | Freq: Every day | ORAL | Status: DC
  Administered 2022-07-20: 25 mg via ORAL
  Filled 2022-07-19: qty 1

## 2022-07-19 MED ORDER — INSULIN ASPART 100 UNIT/ML IJ SOLN
0.0000 [IU] | Freq: Every day | INTRAMUSCULAR | Status: DC
  Administered 2022-07-19 – 2022-07-20 (×2): 2 [IU] via SUBCUTANEOUS

## 2022-07-19 MED ORDER — ALBUTEROL SULFATE (2.5 MG/3ML) 0.083% IN NEBU: 2.5000 mg | INHALATION_SOLUTION | RESPIRATORY_TRACT | Status: DC | PRN

## 2022-07-19 MED ORDER — INSULIN ASPART 100 UNIT/ML IJ SOLN
0.0000 [IU] | Freq: Three times a day (TID) | INTRAMUSCULAR | Status: DC
  Administered 2022-07-19: 5 [IU] via SUBCUTANEOUS
  Administered 2022-07-20: 8 [IU] via SUBCUTANEOUS
  Administered 2022-07-20 – 2022-07-21 (×2): 2 [IU] via SUBCUTANEOUS

## 2022-07-19 MED ORDER — EMPAGLIFLOZIN 10 MG PO TABS
10.0000 mg | ORAL_TABLET | Freq: Every day | ORAL | Status: DC
  Administered 2022-07-19: 10 mg via ORAL
  Filled 2022-07-19: qty 1

## 2022-07-19 MED ORDER — INSULIN ASPART 100 UNIT/ML IJ SOLN
5.0000 [IU] | Freq: Three times a day (TID) | INTRAMUSCULAR | Status: DC
  Administered 2022-07-19 – 2022-07-21 (×5): 5 [IU] via SUBCUTANEOUS

## 2022-07-19 MED ORDER — WARFARIN SODIUM 3 MG PO TABS
3.5000 mg | ORAL_TABLET | Freq: Once | ORAL | Status: AC
  Administered 2022-07-19: 3.5 mg via ORAL
  Filled 2022-07-19: qty 1

## 2022-07-19 NOTE — Progress Notes (Signed)
This chaplain is present for F/U spiritual care. The Pt. Is awake and preparing for therapy. The Pt. wife-Jessica is at the bedside. The chaplain shared a greeting and made plans for a revisit with the Pt.  Chaplain Sallyanne Kuster [phone]

## 2022-07-19 NOTE — Telephone Encounter (Signed)
Patient Advocate Encounter   Received notification from [facility] that prior authorization for Sildenafil is required.   PA submitted on CoverMyMeds Key [id number] Status is pending   Will continue to follow.

## 2022-07-19 NOTE — Progress Notes (Signed)
Physical Therapy Treatment Patient Details Name: Darryl Frye MRN: [id number] DOB: [birth year] Today's Date: 07/19/2022   History of Present Illness Pt is a 61 y.o. male presenting 06/27/22 with CHF for RHC; admitted for inotrope and diuresis.  Other PMH includes NICM s/p ICD, aflutter, CAD, DM, DVT, HTN, heart murmur, renal disorder.  S/p LVAD 11/14.    PT Comments    Pt agreeable and motivated to participate. Mild regression towards physical therapy goals today, with pt endorsing increased dyspnea on exertion and fatigue. Pt ambulating ~40 ft with a Rollator at a min guard assist level, requiring 3 seated rest breaks. Desat to 80% on 4L O2, so bumped up to 6L O2 for remainder of walk. Will continue to benefit from AIR to address strengthening, endurance, balance, and education.     Recommendations for follow up therapy are one component of a multi-disciplinary discharge planning process, led by the attending physician.  Recommendations may be updated based on patient status, additional functional criteria and insurance authorization.  Follow Up Recommendations  Acute inpatient rehab (3hours/day)     Assistance Recommended at Discharge Frequent or constant Supervision/Assistance  Patient can return home with the following A little help with walking and/or transfers;A little help with bathing/dressing/bathroom;Assistance with cooking/housework;Assist for transportation;Help with stairs or ramp for entrance   Equipment Recommendations  BSC/3in1;Rollator (4 wheels)    Recommendations for Other Services       Precautions / Restrictions Precautions Precautions: Fall;Sternal Precaution Comments: LVAD, HFNC Restrictions Weight Bearing Restrictions: Yes (sternal precautions)     Mobility  Bed Mobility Overal bed mobility: Needs Assistance Bed Mobility: Supine to Sit, Sit to Supine     Supine to sit: Min assist Sit to supine: Min assist   General bed mobility comments: MinA with  HOB flat, use of sternal pillow    Transfers Overall transfer level: Needs assistance Equipment used: None Transfers: Sit to/from Stand Sit to Stand: Min assist           General transfer comment: LIght minA to steady once upright    Ambulation/Gait Ambulation/Gait assistance: Min guard, +2 safety/equipment Gait Distance (Feet): 40 Feet Assistive device: Rollator (4 wheels) Gait Pattern/deviations: Step-through pattern, Decreased stride length, Drifts right/left Gait velocity: decreased     General Gait Details: Pt requiring 3 sitting rest breaks   Stairs             Wheelchair Mobility    Modified Rankin (Stroke Patients Only)       Balance Overall balance assessment: Needs assistance Sitting-balance support: Feet supported Sitting balance-Leahy Scale: Good     Standing balance support: No upper extremity supported, During functional activity Standing balance-Leahy Scale: Fair Standing balance comment: reliant on external support                            Cognition Arousal/Alertness: Awake/alert Behavior During Therapy: WFL for tasks assessed/performed Overall Cognitive Status: Within Functional Limits for tasks assessed                                          Exercises      General Comments        Pertinent Vitals/Pain Pain Assessment Pain Assessment: Faces Faces Pain Scale: No hurt    Home Living  Prior Function            PT Goals (current goals can now be found in the care plan section) Acute Rehab PT Goals Patient Stated Goal: feel better Potential to Achieve Goals: Good Progress towards PT goals: Progressing toward goals    Frequency    Min 3X/week      PT Plan Current plan remains appropriate    Co-evaluation              AM-PAC PT "6 Clicks" Mobility   Outcome Measure  Help needed turning from your back to your side while in a flat bed  without using bedrails?: A Little Help needed moving from lying on your back to sitting on the side of a flat bed without using bedrails?: A Little Help needed moving to and from a bed to a chair (including a wheelchair)?: A Little Help needed standing up from a chair using your arms (e.g., wheelchair or bedside chair)?: A Little Help needed to walk in hospital room?: A Little Help needed climbing 3-5 steps with a railing? : A Lot 6 Click Score: 17    End of Session Equipment Utilized During Treatment: Oxygen Activity Tolerance: Patient tolerated treatment well Patient left: in chair;with call bell/phone within reach;with family/visitor present Nurse Communication: Mobility status PT Visit Diagnosis: Unsteadiness on feet (R26.81);Difficulty in walking, not elsewhere classified (R26.2)     Time: 5009-3818 PT Time Calculation (min) (ACUTE ONLY): 32 min  Charges:  $Therapeutic Activity: 23-37 mins                     Wyona Almas, PT, DPT Acute Rehabilitation Services Office [phone]    Darryl Frye 07/19/2022, 3:58 PM

## 2022-07-19 NOTE — Progress Notes (Signed)
Occupational Therapy Treatment Patient Details Name: Darryl Frye MRN: [id number] DOB: [birth year] Today's Date: 07/19/2022   History of present illness Pt is a 61 y.o. male presenting 06/27/22 with CHF for RHC; admitted for inotrope and diuresis.  Other PMH includes NICM s/p ICD, aflutter, CAD, DM, DVT, HTN, heart murmur, renal disorder.  S/p LVAD 11/14.   OT comments  Darryl Frye is making excellent progress and remains highly motivated. Session focused on activity tolerance. Pt abel to vocalize steps to switch from wall power, to batteries and don harness. Overall pt completed bed mobility with min G, and 2/3 transfers with min G needing light min A for the third. Pt tolerated talking from his room on 2H, to the waiting room on 6L [place] and 1 sitting rest break and the eva walker (>138ft). Pt then taken to [place] and tranferred from the transport chair to the chair in his new room. Mild nose bleed noted. RN present and aware. OT to continue to follow. POC remains appropriate.    Recommendations for follow up therapy are one component of a multi-disciplinary discharge planning process, led by the attending physician.  Recommendations may be updated based on patient status, additional functional criteria and insurance authorization.    Follow Up Recommendations  Acute inpatient rehab (3hours/day)     Assistance Recommended at Discharge Frequent or constant Supervision/Assistance  Patient can return home with the following  Two people to help with walking and/or transfers;A lot of help with bathing/dressing/bathroom;Assistance with cooking/housework;Direct supervision/assist for medications management;Direct supervision/assist for financial management;Assist for transportation;Help with stairs or ramp for entrance   Equipment Recommendations  None recommended by OT    Recommendations for Other Services      Precautions / Restrictions Precautions Precautions: Fall;Sternal Precaution Comments: LVAD, O2,  nose bleeds Restrictions Weight Bearing Restrictions: Yes Other Position/Activity Restrictions: UE sternal precautions       Mobility Bed Mobility Overal bed mobility: Needs Assistance Bed Mobility: Supine to Sit     Supine to sit: Supervision     General bed mobility comments: HOB elevated    Transfers Overall transfer level: Needs assistance Equipment used: None Transfers: Sit to/from Stand Sit to Stand: Min assist           General transfer comment: min G for 2x, min A for 3rd     Balance Overall balance assessment: Needs assistance Sitting-balance support: Feet supported Sitting balance-Leahy Scale: Good     Standing balance support: No upper extremity supported, During functional activity Standing balance-Leahy Scale: Fair Standing balance comment: reliant on external support                           ADL either performed or assessed with clinical judgement   ADL Overall ADL's : Needs assistance/impaired                                     Functional mobility during ADLs: Minimal assistance (eva walker) General ADL Comments: session focused on activity tolerance. Pt goal to walk out of 2H ICU to [place] (new bed)    Extremity/Trunk Assessment Upper Extremity Assessment Upper Extremity Assessment: Generalized weakness   Lower Extremity Assessment Lower Extremity Assessment: Defer to PT evaluation        Vision   Vision Assessment?: No apparent visual deficits   Perception Perception Perception: Within Functional Limits   Praxis Praxis Praxis:  Intact    Cognition Arousal/Alertness: Awake/alert Behavior During Therapy: WFL for tasks assessed/performed Overall Cognitive Status: Within Functional Limits for tasks assessed                                 General Comments: very motivated        Exercises      Shoulder Instructions       General Comments VSS on 6L [place]. Mild nose bleed noted at the  end of the session, RN present and aware    Pertinent Vitals/ Pain       Pain Assessment Pain Assessment: No/denies pain Pain Intervention(s): Monitored during session  Home Living                                          Prior Functioning/Environment              Frequency  Min 2X/week        Progress Toward Goals  OT Goals(current goals can now be found in the care plan section)  Progress towards OT goals: Progressing toward goals  Acute Rehab OT Goals Patient Stated Goal: to go to rehab OT Goal Formulation: With patient Time For Goal Achievement: 07/21/22 Potential to Achieve Goals: Good ADL Goals Pt Will Perform Grooming: with supervision;standing Pt Will Perform Upper Body Dressing: with supervision;sitting Pt Will Perform Lower Body Dressing: with min guard assist;sit to/from stand Pt Will Transfer to Toilet: with min guard assist;ambulating;regular height toilet  Plan Discharge plan remains appropriate    Co-evaluation                 AM-PAC OT "6 Clicks" Daily Activity     Outcome Measure   Help from another person eating meals?: None Help from another person taking care of personal grooming?: A Little Help from another person toileting, which includes using toliet, bedpan, or urinal?: A Lot Help from another person bathing (including washing, rinsing, drying)?: A Lot Help from another person to put on and taking off regular upper body clothing?: A Little Help from another person to put on and taking off regular lower body clothing?: A Lot 6 Click Score: 16    End of Session Equipment Utilized During Treatment: Oxygen (EVA)  OT Visit Diagnosis: Unsteadiness on feet (R26.81);Muscle weakness (generalized) (M62.81);Pain   Activity Tolerance Patient tolerated treatment well   Patient Left in chair;with call bell/phone within reach;with family/visitor present   Nurse Communication Mobility status        Time:  9628-3662 OT Time Calculation (min): 23 min  Charges: OT General Charges $OT Visit: 1 Visit OT Treatments $Therapeutic Activity: 23-37 mins    Darryl Frye 07/19/2022, 5:52 PM

## 2022-07-19 NOTE — Progress Notes (Signed)
TCTS Progress Note:  NAEO     Latest Ref Rng & Units 07/19/2022    4:13 AM 07/18/2022    5:31 AM 07/17/2022    4:45 AM  CBC  WBC 4.0 - 10.5 K/uL 9.6  11.7  13.2   Hemoglobin 13.0 - 17.0 g/dL 8.2  8.3  7.6   Hematocrit 39.0 - 52.0 % 25.6  26.0  23.1   Platelets 150 - 400 K/uL 568  595  551        Latest Ref Rng & Units 07/19/2022    4:13 AM 07/18/2022    5:31 AM 07/17/2022    4:45 AM  CMP  Glucose 70 - 99 mg/dL 79  138  164   BUN 6 - 20 mg/dL 26  29  25    Creatinine 0.61 - 1.24 mg/dL 1.11  1.28  1.34   Sodium 135 - 145 mmol/L 129  128  128   Potassium 3.5 - 5.1 mmol/L 3.6  4.2  4.4   Chloride 98 - 111 mmol/L 91  91  91   CO2 22 - 32 mmol/L 27  26  24    Calcium 8.9 - 10.3 mg/dL 8.4  8.7  8.6     ABG    Component Value Date/Time   PHART 7.557 (H) 07/14/2022 0513   PCO2ART 37.1 07/14/2022 0513   PO2ART 113 (H) 07/14/2022 0513   HCO3 33.1 (H) 07/14/2022 0513   TCO2 34 (H) 07/14/2022 0513   ACIDBASEDEF 2.0 07/07/2022 0510   O2SAT 64.4 07/19/2022 0413   Exam NAD Alert awake Abd soft ntnd 1+ LE edema    POD 15 LVAD implantation N no issues CV: VAD flows ~4.5L Resp: nasal cannula, ongoing PNA tx GI: no issues GU: no issues Heme: dosed 3.5 mg coumadin today for INR 1.9 ID: ongoing PNA tx T/l/d all chest tubes out Has midline  Plan: Rehab facility when near off 74m on stable diuretic regimen Cont coumadin

## 2022-07-19 NOTE — Progress Notes (Addendum)
LVAD Coordinator Rounding Note:  HM 3 LVAD implanted on 07/04/22 by Dr Tenny Craw under destination criteria due to uncontrolled diabetes. Left atrial appendage clipped at time of surgery.  Pt sitting up in bed upon my arrival. States he is feeling better this morning. He walked in the hallway this morning with minimal rest breaks.   Reports 3 black bowel movements yesterday. Denies BRBPR. Hgb stable at 8.2 today. ? Old blood from nosebleed. Will continue to closely monitor.   Plan for CIR when medically ready and bed available. Plan to transfer to [facility] today.   VAD discharge teaching completed with pt and caregiver. See separate note for documentation. Home equipment has been ordered. Will need ICD turned on tomorrow.   Vital signs: Temp: 98.6 HR: 85 NSR Doppler Pressure: 90 Arterial BP: 104/81 (90) O2 Sat: 100% on 4L [place] Wt: 214.3>208.8>209.2>207.2>206.6>205.3>200>197.1>195.9>198.4>197.1 lb  LVAD interrogation reveals:  Speed: 5400 Flow: 4.5 Power: 4.0 w PI: 3.5   Alarms: none Events: none Hematocrit: 26  Fixed speed: 5300 Low speed limit: 5000   Drive Line:  Existing dressing clean, dry, and intact. Anchor correctly applied. Next dressing change 07/20/22 to be performed by nurse champion, [place] coordinator, or trained caregiver.  Labs:  LDH trend: 326>400>462>422>387>388>368>354>243>245>228  INR trend: 1.1>1.5>1.4>1.5>3.0>2.4>2.2>2.2>2.2>1.9>1.9  WBC trend: 12.6>18>20.9>17.4>11.5>11.0>15.1>16.4>13.2>11.7>9.6  Anticoagulation Plan: -INR Goal: 2.0 - 2.5 -ASA Dose: 81mg    Blood Products:  IntraOp 11/14:  - 4 FFP  - DDAVP - 617 cell saver  PostOp 11/14: - 2 FFP  07/17/22: - 1 PRBC  Device: -Medtronic -Therapies: OFF  Arrythmias: AFIB RVR 11/17. Amiodarone transitioned to p.o. 07/12/22  Respiratory: Extubated 07/06/22  Renal:  -BUN:25>18>20>26>27>25>17>25>29>26 -CRT:1.28>1.23>1.44>1.46>1.47>1.27>1.1>1.34>1.28>1.11  Drips:  Infection: 07/07/22>> blood  cultures>> no growth 5 days; final 07/07/22>> expectorated sputum>>moderate capnocutophaga; final (On Zosyn x 1 week)    Pt Education: Completed discharge education with [name] and [name]. See separate note for documentation.  Plan for [name] to complete dressing change tomorrow afternoon with VAD coordinator supervision.                                            Plan/Recommendations:  1. Page VAD coordinator for equipment or drive line issues. 2. Continue every other day drive line dressing changes per VAD coordinator, nurse champion, or trained caregiver.  Emerson Monte RN [place] Coordinator  Office: [phone]  24/7 Pager: [phone]

## 2022-07-19 NOTE — Progress Notes (Signed)
Inpatient Diabetes Program Recommendations  AACE/ADA: New Consensus Statement on Inpatient Glycemic Control (2015)  Target Ranges:  Prepandial:   less than 140 mg/dL      Peak postprandial:   less than 180 mg/dL (1-2 hours)      Critically ill patients:  140 - 180 mg/dL   Lab Results  Component Value Date   GLUCAP 314 (H) 07/19/2022   HGBA1C 9.3 (H) 06/27/2022    Review of Glycemic Control  Latest Reference Range & Units 07/18/22 11:26 07/18/22 15:24 07/18/22 21:55 07/19/22 06:23 07/19/22 11:16  Glucose-Capillary 70 - 99 mg/dL 287 (H) 201 (H) 257 (H) 104 (H) 314 (H)   Diabetes history: DM 2 Outpatient Diabetes medications:  Lantus 40 units daily Jardiance 25 mg daily Metformin 1000 mg bid Current orders for Inpatient glycemic control:  Novolog 0-24 units tid with meals and HS Semglee 25 units q AM and 22 units q PM  Inpatient Diabetes Program Recommendations:   Note that post-prandial blood sugars increased. Consider d/c of TCTS correction and add Novolog moderate tid with meals and HS. Also please add Novolog meal coverage 5 units tid with meals (hold if patient eats less than 50% or NPO).   Adah Perl, RN, BC-ADM Inpatient Diabetes Coordinator Pager [phone]  (8a-5p)

## 2022-07-19 NOTE — Progress Notes (Addendum)
VAD Discharge Teaching Note:  Discharge VAD teaching completed with [name] and [name].  The home inspection checklist has been reviewed and no unsafe conditions have been identified. Family reports that there are at least two dedicated grounded, 3-prong outlets with clearly labeled circuit breaker has been established in the bedroom for power module and Charity fundraiser.   Both patient and caregiver have been trained on the following:  1. HM III LVAD overview of system operations  2. Overview of major lifestyle accommodations and cautions   3. Overview of system components (features and functions) 4. Changing power sources 5. Overview of alerts and alarms 6. How to identify and manage an emergency including when pump is running and when pump has stopped  7. Changing system controller 8. Maintain emergency contact list and medications  The patient and caregiver have successfully demonstrated:  1. Changing power source (from batteries to mobile power unit, mobile power unit to batteries, and replacing batteries) 2. Perform system controller self test  3. Check and charge batteries  4. Change system controller 5. Paged VAD pager and programmed number in phones  A daily flow sheet with patient  weight, temperature,  flow, speed, power, and PI, along with daily self checks on system controller and power module have been performed by patient and caregiver during hospitalization and will also be done daily at home.   The caregiver has been trained on percutaneous lead exit site care, care of the driveline and dressing changes. She has performed dressing changes during patient's hospitalization under my supervision with the support of the nursing staff. The importance of lead immobilization has been stressed to patient and caregiver using the attachment device. The caregiver has successfully demonstrated the following: 1. Cleansing site with sterile technique 2. Dressing care and maintenance  3.  Immobilizing driveline  The following routine activities and maintenance have been reviewed with patient and caregiver and both verbalize understanding:  1. Stressed importance of never disconnecting power from both controller power leads at the same time, and never disconnecting both batteries at the same time, or the pump will alarm and eventually stop if no power supply restored.  2. Plug the mobile power unit (MPU) and the universal battery charger (UBC) into properly grounded (3 prong) outlets dedicated to PM use. Do NOT use adapter (cheater plug) for ungrounded outlets or multiple portable socket outlets (power strips) 3. Do not connect the PM or MPU to an outlet controlled by wall switch or the device may not work 4. Transfer from MPU to batteries during [facility] mains power failure. The PM has internal backup battery that will power the pump while you transfer to batteries 5. Keep a backup system controller, charged batteries, battery clips, and flashlight near you during sleep in case of electrical power outage 6. Clean battery, battery clip, and universal battery charger contacts weekly 7. Visually inspect percutaneous lead daily 8. Check cables and connectors when changing power source  9. Rotate batteries; keep all eight batteries charged 10. Always have backup system controller, battery clips, fully charged batteries, and spare fully charged batteries when traveling 11. Re-calibrate batteries every 70 uses; monitor battery life of 36 months or 360 uses; replace batteries at end of battery life   Identified the following changes in activities of daily living with pump:  1. No driving for at least six weeks and then only if doctor gives permission to do so 2. No tub baths while pump implanted, and shower only if doctor gives permission 3.  No swimming or submersion in water while implanted with pump 4. Keep all VAD equipment away from water or moisture 5. Keep all VAD connections clean and  dry 6. No contact sports or engage in jumping activities 7. Never have an MRI while implanted with the pump 8. Never leave or store batteries in extremely hot or cold places (such as   trunk of your car), or the battery life will be shortened 9. Call the doctor or hospital contact person if any change in how the pump sounds, feels, or works 10. Plan to sleep only when connected to the mobile power unit. Marland Kitchen 11. Keep a backup system controller, charged batteries, battery clips, and flashlight near you during sleep in case of electrical power outage 12. Do not sleep on your stomach 13. Talk with doctor before any [place] distance travel plans 14. Patient will need antibiotics prior to any dental procedure; instructed to contact VAD coordinator before any dental procedures (including routine cleaning)   Discharge binder given to patient and include the following: 1. List of emergency contacts 2. Wallet card 3. HM III Luggage tags 4. HM III Alarms for Patients and their Caregivers 5. HM III Patient Handbook 6. HM III Patient Education Program DVD 7. Daily diary sheets 8. Warfarin teaching sheets 9. Nosebleed teaching sheets 10. Medications you may and may not take with CHF list  Discharge equipment includes:  1. Two system controllers 2. One mobile power unit with attached 21 ' patient cable 3. One universal Charity fundraiser (UBC) 4. Eight fully charged batteries  5. Four battery clips 6. One travel case 7. One holster vest 8. Wearable accessory package 9. Daily dressing kits and anchors  Following notification process completed with: [place] FNP,  PCP   Discussed frequency and importance of INR checks; emphasized importance of maintaining INR goal to prevent clotting and or bleeding issues with pump. Patient able to answer questions and asked good questions pertaining to warfarin and diet/lifestyle changes necessary to be  successful and safe.  Patient will have INR managed by VAD Clinic; current INR goal is 2.0 - 2.5.    The patient has completed a proficiency test for the HM III and all questions have been answered. The pt and family have been instructed to call if any questions, problems, or concerns arise. Pt and caregiver successfully paged VAD coordinator using VAD pager emergency number and have been instructed to use this number only for emergencies. Patient and caregiver asked appropriate questions, had good interaction with VAD coordinator, and verbalized understanding of above instructions.   Pt discharging to his house per original plan. His caregivers plan to stay with him for the first 2 weeks, and as needed after initial 2- week period.  Caregiver will plan to change drive line dressing daily (or as instructed by VAD coordinators). Pt verbalized agreement with this plan.   Total elapsed time:  4 hours  Emerson Monte RN [place] Coordinator  Office: [phone]  24/7 Pager: [phone]

## 2022-07-19 NOTE — Progress Notes (Signed)
Patient ID: Darryl Frye, male   DOB: 1960-10-01, 61 y.o.   MRN: [id number]  Advanced Heart Failure VAD Team Note  PCP-Cardiologist: Carlyle Dolly, MD   Subjective:    - 11/13 S/P HMIII L atrial appendage closure.  - 11/16 Extubated . GIven 40 mg IV and started on lasix drip 10 mg per/hr.  - 11/17 Developed A fib RVR. Given bolus 150 x 2. Unchanged.  - 11/18 Bedside echo 11/18 LV small. VAD cannula well aligned. AoV opening every beat. Septum pulling to left RV moderately HK. Small to moderate anterior effusion - 11/22 Ramp echo - 11/22 Nasal cautery and packing by ENT. Ramp echo. Speed increased to 5400.  CT chest suggestive of patchy multifocal PNA and atelectasis.   Hgb remains stable today, 8.2.   Off milrinone, CO-OX 64%.  Good diuresis yesterday, weight down 1 lb. CVP 12-13.  Now on 3L [place].   Scr stable 1.11.    Sputum culture + for capnocytophaga. CCM switched to zosyn given persistent infiltrates on CXR.   MAPs 70s-80s  Feels good today, getting stronger.   LVAD INTERROGATION:  HeartMate III LVAD:   Flow 4.6 liters/min, speed 5400, power 4.1 PI 3.2. no PI events LDH stable 271 => 258 => 243=>245 => 228 INR 1.9  Objective:    Vital Signs:   Temp:  [98.2 F (36.8 C)-100.6 F (38.1 C)] 98.6 F (37 C) (11/29 0730) Pulse Rate:  [79-101] 79 (11/29 0600) Resp:  [13-32] 22 (11/29 0600) BP: (81-116)/(55-95) 116/89 (11/29 0700) SpO2:  [81 %-100 %] 100 % (11/29 0600) Weight:  [89.4 kg] 89.4 kg (11/29 0500) Last BM Date : 07/18/22 Mean arterial Pressure 70s-80s  Intake/Output:   Intake/Output Summary (Last 24 hours) at 07/19/2022 0800 Last data filed at 07/19/2022 0600 Gross per 24 hour  Intake 1430.16 ml  Output 3460 ml  Net -2029.84 ml     Physical Exam   CVP 12-13 General: Well appearing this am. NAD.  HEENT: Normal. Neck: Supple, JVP 12 cm. Carotids OK.  Cardiac:  Mechanical heart sounds with LVAD hum present.  Lungs:  CTAB, normal effort.  Abdomen:   NT, ND, no HSM. No bruits or masses. +BS  LVAD exit site: Well-healed and incorporated. Dressing dry and intact. No erythema or drainage. Stabilization device present and accurately applied. Driveline dressing changed daily per sterile technique. Extremities:  Warm and dry. No cyanosis, clubbing, rash, or edema.  Neuro:  Alert & oriented x 3. Cranial nerves grossly intact. Moves all 4 extremities w/o difficulty. Affect pleasant    Telemetry   Sinus 80s-90s Personally reviewed   Labs   Basic Metabolic Panel: Recent Labs  Lab 07/15/22 0430 07/16/22 0555 07/17/22 0445 07/18/22 0531 07/19/22 0413  NA 130* 129* 128* 128* 129*  K 3.8 3.7 4.4 4.2 3.6  CL 88* 88* 91* 91* 91*  CO2 27 26 24 26 27   GLUCOSE 174* 149* 164* 138* 79  BUN 25* 23* 25* 29* 26*  CREATININE 1.40* 1.22 1.34* 1.28* 1.11  CALCIUM 8.7* 8.8* 8.6* 8.7* 8.4*  MG 2.4  --   --   --   --     Liver Function Tests: No results for input(s): "AST", "ALT", "ALKPHOS", "BILITOT", "PROT", "ALBUMIN" in the last 168 hours. No results for input(s): "LIPASE", "AMYLASE" in the last 168 hours. No results for input(s): "AMMONIA" in the last 168 hours.  CBC: Recent Labs  Lab 07/15/22 0430 07/16/22 0555 07/17/22 0445 07/18/22 0531 07/19/22 0413  WBC  15.9* 15.1* 13.2* 11.7* 9.6  HGB 7.9* 7.8* 7.6* 8.3* 8.2*  HCT 24.6* 24.6* 23.1* 26.0* 25.6*  MCV 84.5 85.7 84.6 85.8 86.2  PLT 517* 534* 551* 595* 568*    INR: Recent Labs  Lab 07/15/22 0430 07/16/22 0555 07/17/22 0445 07/18/22 0531 07/19/22 0413  INR 2.2* 2.3* 2.2* 1.9* 1.9*  :    Imaging   DG CHEST PORT 1 VIEW  Result Date: 07/17/2022 CLINICAL DATA:  Congestive heart failure. Short of breath. Left ventricular assist device. EXAM: PORTABLE CHEST 1 VIEW COMPARISON:  07/15/2022 and older exams.  CT, 07/12/2022. FINDINGS: Stable bilateral coarse irregular interstitial opacities and intervening hazy airspace opacities. No pneumothorax. No new lung abnormality. No  change in the position of the left ventricular assist device. Stable left anterior chest wall AICD and right sided PICC. IMPRESSION: 1. No change from the most recent prior exam. Persistent interstitial hazy airspace lung opacities suspected to be infectious/inflammatory in etiology. 2. Electronically Signed   By: Lajean Manes M.D.   On: 07/17/2022 08:19     Medications:     Scheduled Medications:  sodium chloride   Intravenous Once   alteplase       amiodarone  200 mg Oral BID   aspirin  81 mg Oral Daily   atorvastatin  20 mg Oral Daily   bisacodyl  10 mg Oral Daily   Or   bisacodyl  10 mg Rectal Daily   Chlorhexidine Gluconate Cloth  6 each Topical Daily   digoxin  0.125 mg Oral Daily   Fe Fum-Vit C-Vit B12-FA  1 capsule Oral QPC breakfast   feeding supplement  237 mL Oral TID BM   furosemide  80 mg Intravenous BID   influenza vac split quadrivalent PF  0.5 mL Intramuscular Tomorrow-1000   insulin aspart  0-24 Units Subcutaneous TID WC & HS   insulin glargine-yfgn  22 Units Subcutaneous QHS   insulin glargine-yfgn  25 Units Subcutaneous Daily   ipratropium-albuterol  3 mL Nebulization TID   levothyroxine  25 mcg Oral Q0600   lidocaine  2 patch Transdermal Q24H   magnesium oxide  400 mg Oral BID   melatonin  3 mg Oral QHS   multivitamin with minerals  1 tablet Oral Daily   pantoprazole  40 mg Oral Daily   pneumococcal 20-valent conjugate vaccine  0.5 mL Intramuscular Tomorrow-1000   polyethylene glycol  17 g Oral BID   potassium chloride  40 mEq Oral Q4H   senna-docusate  1 tablet Oral BID   sertraline  50 mg Oral Daily   sildenafil  20 mg Oral TID   sodium chloride flush  10-40 mL Intracatheter Q12H   sodium chloride flush  10-40 mL Intracatheter Q12H   sodium chloride flush  3 mL Intravenous Q12H   sodium chloride flush  3 mL Intravenous Q12H   spironolactone  25 mg Oral Daily   Warfarin - Physician Dosing Inpatient   Does not apply q1600    Infusions:  sodium  chloride Stopped (07/07/22 1346)   sodium chloride     sodium chloride Stopped (07/18/22 1810)   lactated ringers     lactated ringers Stopped (07/04/22 2207)   lactated ringers Stopped (07/14/22 0801)   piperacillin-tazobactam (ZOSYN)  IV 3.375 g (07/19/22 0558)    PRN Medications: sodium chloride, acetaminophen, ALPRAZolam, alteplase, dextrose, hydrALAZINE, lactated ringers, morphine injection, ondansetron (ZOFRAN) IV, ondansetron (ZOFRAN) IV, mouth rinse, oxyCODONE, sodium chloride flush, sodium chloride flush, sodium chloride flush, traMADol, tranexamic  acid   Patient Profile   Admitted with A/C HFrEF --> cardiogenic shock. S/P HMIII LVAD  Assessment/Plan:    1. Acute on chronic systolic CHF/cardiogenic shock: Nonischemic cardiomyopathy, diagnosed 2020.  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  Cath in 12/20 with no significant coronary disease.  [employer] ICD. Most recent echo in 2/23 showed EF 20-25% with normal RV.  Recently, patient has been symptomatically worse, NYHA class IV with profound orthopnea. RHC showed markedly elevated filling pressures, primarily pulmonary venous hypertension, low cardiac output, and low PAPI.  Patient was admitted for inotrope and diuresis. Echo this admission with EF <20%, mod RV dysfunction.  He was started on milrinone with improvement in hemodynamics.  We proceeded with HM3 LVAD with LAA clip on 11/14.  He is now off milrinone and remains on sildenafil 20 tid.  Co-ox 64%. CVP 12-13. Wt down 1 lb. Remains on 3L [place]. Creatinine stable. Good flows on LVAD.  - Plan IV Lasix again today, 80 mg bid  - Continue sildenafil 20 tid.  - Continue 25 mg spiro daily.  - Continue digoxin 0.125 daily.  - Restart Jardiance 10 mg daily.  2. A fib RVR, post-op: H/O S/p flutter ablation.   Remains in NSR.  - Continue po amiodarone 200 BID - Continue warfarin.  3. VAD management: s/p HM-3 VAD on 11/14.  Bedside echo 11/18 LV small, VAD cannula well aligned, AoV opening every beat, septum pulling to left RV moderately HK, small to moderate anterior effusion. Ramp echo 11/22 RV mild to mod HK. Speed turned to 5400. LDH 228. MAP 70s-80s.  - Now on warfarin with INR 1.9 - ASA 81. - VAD interrogated personally. Parameters stable. 4. H/o DVT: On anticoagulation.  5. Acute hypoxemic respiratory failure, post-op. 11/16 extubated.  Bilateral lower lobe infiltrates on CXR. CVP trending down but CXR has not improved, still with bilateral infiltrates and significant oxygen requirement.  He has been on amiodarone this admission for atrial fibrillation, was not on amiodarone before this hospitalization.  Will need to think about amiodarone lung toxicity, discussed with Dr Tacy Learn who does not think particularly likely but will need to monitor for improvement.  It is also possible that he aspirated.  Sputum culture + for capnocytophaga. CCM switched to zosyn given persistent infiltrates on CXR.CT chest w/o contrast. CT chest 11/22 with diffuse interstitial and patchy alveolar opacities. He continues to have oxygen requirement but coming down, 3L [place] today.  - Encourage hourly incentive spirometry. - Encourage mobility.  - Continue Zosyn x 1 week.  6. AKI: Creatinine stable at 1.11. - Diuretic management as above 7. ID: Afebrile, WBCs 13. He completed initial vanc/cefepime empiric abx for ?post-op PNA. 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 CCM switched to zosyn given persistent infiltrates on imaging - Zosyn x 1 week.  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  9. Hypokalemia - Continue spironolactone.  10. Epistaxis: s/p cauterization/packing 11/22, packing now removed.  11. Anemia: Hgb 7.9 => 7.8 => 7.6=>1uRBCs=>8.3=>8.2.  12. Hyponatremia: Na 129. Fluid restrict.  13. Deconditioning -  PT/OT following  - CIR eventually.   [facility] to [facility] today.   Loralie Champagne  07/19/2022 8:00 AM

## 2022-07-20 LAB — COOXEMETRY PANEL
Carboxyhemoglobin: 2.8 % — ABNORMAL HIGH (ref 0.5–1.5)
Methemoglobin: <0.7 % (ref 0.0–1.5)
O2 Saturation: 69.2 %
Total hemoglobin: 9.1 g/dL — ABNORMAL LOW (ref 12.0–16.0)

## 2022-07-20 LAB — LACTATE DEHYDROGENASE: LDH: 227 U/L — ABNORMAL HIGH (ref 98–192)

## 2022-07-20 LAB — GLUCOSE, CAPILLARY
Glucose-Capillary: 148 mg/dL — ABNORMAL HIGH (ref 70–99)
Glucose-Capillary: 108 mg/dL — ABNORMAL HIGH (ref 70–99)
Glucose-Capillary: 271 mg/dL — ABNORMAL HIGH (ref 70–99)
Glucose-Capillary: 226 mg/dL — ABNORMAL HIGH (ref 70–99)

## 2022-07-20 LAB — BASIC METABOLIC PANEL
Anion gap: 13 (ref 5–15)
BUN: 27 mg/dL — ABNORMAL HIGH (ref 6–20)
CO2: 28 mmol/L (ref 22–32)
Calcium: 9.2 mg/dL (ref 8.9–10.3)
Chloride: 92 mmol/L — ABNORMAL LOW (ref 98–111)
Creatinine, Ser: 1.4 mg/dL — ABNORMAL HIGH (ref 0.61–1.24)
GFR, Estimated: 58 mL/min — ABNORMAL LOW (ref >60)
Glucose, Bld: 140 mg/dL — ABNORMAL HIGH (ref 70–99)
Potassium: 4.1 mmol/L (ref 3.5–5.1)
Sodium: 133 mmol/L — ABNORMAL LOW (ref 135–145)

## 2022-07-20 LAB — PROTIME-INR
INR: 1.7 — ABNORMAL HIGH (ref 0.8–1.2)
Prothrombin Time: 20.2 seconds — ABNORMAL HIGH (ref 11.4–15.2)

## 2022-07-20 LAB — MAGNESIUM: Magnesium: 2.3 mg/dL (ref 1.7–2.4)

## 2022-07-20 MED ORDER — WARFARIN - PHARMACIST DOSING INPATIENT: Freq: Every day | Status: DC

## 2022-07-20 MED ORDER — POTASSIUM CHLORIDE CRYS ER 20 MEQ PO TBCR
40.0000 meq | EXTENDED_RELEASE_TABLET | Freq: Once | ORAL | Status: AC
  Administered 2022-07-20: 40 meq via ORAL
  Filled 2022-07-20: qty 2

## 2022-07-20 MED ORDER — WARFARIN SODIUM 5 MG PO TABS
5.0000 mg | ORAL_TABLET | Freq: Once | ORAL | Status: AC
  Administered 2022-07-20: 5 mg via ORAL
  Filled 2022-07-20: qty 1

## 2022-07-20 MED ORDER — FUROSEMIDE 10 MG/ML IJ SOLN
80.0000 mg | Freq: Two times a day (BID) | INTRAMUSCULAR | Status: AC
  Administered 2022-07-20 (×2): 80 mg via INTRAVENOUS
  Filled 2022-07-20 (×2): qty 8

## 2022-07-20 NOTE — Progress Notes (Signed)
LVAD Coordinator Rounding Note:  HM 3 LVAD implanted on 07/04/22 by Dr Tenny Craw under destination criteria due to uncontrolled diabetes. Left atrial appendage clipped at time of surgery.  Pt transferred to [facility] yesterday evening. Pt sitting on the edge of the bed upon my arrival. States he is feeling better this morning. He walked in the hallway this morning with minimal rest breaks.   Reports 3 black bowel movements Tuesday. Denies BRBPR. Hgb stable at 8.2 yesterday. ? Old blood from nosebleed. Will continue to closely monitor. No CBC drawn today.  Plan for CIR when medically ready and bed available.   VAD discharge teaching completed with pt and caregiver. See separate note for documentation. Home equipment has been ordered.   [employer] rep in today to turn ICD therapy back on. Rep states that pt has sensing issues now that VAD has been placed. D/w Dr Aundra Dubin.  Vital signs: Temp: 98.7 HR: 90 NSR Doppler Pressure: 92 Arterial BP: 99/87 (93) O2 Sat: 98% on 3L [place] Wt: 214.3>208.8>209.2>207.2>206.6>205.3>200>197.1>195.9>198.4>197.1>189.3 lb  LVAD interrogation reveals:  Speed: 5400 Flow: 4.6 Power: 4.1 w PI: 3.7   Alarms: none Events: none Hematocrit: 26  Fixed speed: 5300 Low speed limit: 5000   Drive Line:  Dressing change performed by caregiver [name] with VAD Coordinator observing with very few verbal cues. Existing VAD dressing removed and site care performed using sterile technique. Drive line exit site cleaned with Chlora prep applicators x 2, allowed to dry, and gauze dressing with silver strip applied. Exit site partially incorporated, the velour is fully implanted at exit site. 1 blue sutures, and 1 black suture in place. Slight bruising at exit site. Scant amount of dried serous drainage on previous dressing/silver strip. No redness, tenderness, or foul odor. Drive line anchor re-applied. Next dressing change 07/22/22 to be performed by nurse champion, [place] coordinator, or  trained caregiver-Jessica.      Labs:  LDH trend: 326>400>462>422>387>388>368>354>243>245>228>227  INR trend: 1.1>1.5>1.4>1.5>3.0>2.4>2.2>2.2>2.2>1.9>1.9>1.7  WBC trend: 12.6>18>20.9>17.4>11.5>11.0>15.1>16.4>13.2>11.7>9.6  Anticoagulation Plan: -INR Goal: 2.0 - 2.5 -ASA Dose: 81mg    Blood Products:  IntraOp 11/14:  - 4 FFP  - DDAVP - 617 cell saver  PostOp 11/14: - 2 FFP  07/17/22: - 1 PRBC  Device: -[employer]tronic -Therapies: on 07/20/22 VF > 214 BPM--ATP during charging VT > OFF  Arrythmias: AFIB RVR 11/17. Amiodarone transitioned to p.o. 07/12/22  Respiratory: Extubated 07/06/22  Renal:  -BUN:25>18>20>26>27>25>17>25>29>26>27  -CRT:1.28>1.23>1.44>1.46>1.47>1.27>1.1>1.34>1.28>1.11>1.4  Drips:  Infection: 07/07/22>> blood cultures>> no growth 5 days; final 07/07/22>> expectorated sputum>>moderate capnocutophaga; final (On Zosyn x 1 week)    Pt Education: Completed discharge education with [name] and [name]. See separate note for documentation.  [name] checked off to perform dressing changes independently.                                            Plan/Recommendations:  1. Page VAD coordinator for equipment or drive line issues. 2. Continue every other day drive line dressing changes per VAD coordinator, nurse champion, or trained caregiver-Jessica.  Tanda Rockers RN [place] Coordinator  Office: [phone]  24/7 Pager: [phone]

## 2022-07-20 NOTE — Progress Notes (Signed)
Mobility Specialist Progress Note    07/20/22 1619  Mobility  Activity Ambulated with assistance in hallway  Level of Assistance Contact guard assist, steadying assist  Assistive Device Four wheel walker  Distance Ambulated (ft) 90 ft (20+25+45)  Activity Response Tolerated fair  Mobility Referral Yes  $Mobility charge 1 Mobility   Pre-Mobility: 87 HR, 97% SpO2 Post-Mobility: 87 HR  Pt received sitting EOB and agreeable. C/o being low energy. Took x2 extended seated rest breaks c/o SOB. On 6LO2 during. Returned to sitting EOB with call bell in reach.   Hildred Alamin Mobility Specialist  Please Psychologist, sport and exercise or Rehab Office at [phone]

## 2022-07-20 NOTE — Progress Notes (Addendum)
Patient ID: Darryl Frye, male   DOB: 01-Oct-1960, 61 y.o.   MRN: [id number]  Advanced Heart Failure VAD Team Note  PCP-Cardiologist: Carlyle Dolly, MD   Subjective:    - 11/13 S/P HMIII L atrial appendage closure.  - 11/16 Extubated . GIven 40 mg IV and started on lasix drip 10 mg per/hr.  - 11/17 Developed A fib RVR. Given bolus 150 x 2. Unchanged.  - 11/18 Bedside echo 11/18 LV small. VAD cannula well aligned. AoV opening every beat. Septum pulling to left RV moderately HK. Small to moderate anterior effusion - 11/22 Ramp echo - 11/22 Nasal cautery and packing by ENT. Ramp echo. Speed increased to 5400.  CT chest suggestive of patchy multifocal PNA and atelectasis.   Off milrinone, CO-OX 69%.  Good diuresis yesterday, -5.4L UOP, weight down 8lbs? CVP remains elevated 14.  Now on 2L [place].   Scr 1.11>1.4.    Sputum culture + for capnocytophaga. CCM switched to zosyn given persistent infiltrates on CXR.   Feels good this morning, no pain.   LVAD INTERROGATION:  HeartMate III LVAD:   Flow 4.6 liters/min, speed 5400, power 4 PI 3.7. no PI events LDH stable 271 => 258 => 243=>245 => 228 =>227 INR 1.7  Objective:    Vital Signs:   Temp:  [98.1 F (36.7 C)-99.4 F (37.4 C)] 98.2 F (36.8 C) (11/30 0804) Pulse Rate:  [80-94] 86 (11/30 0914) Resp:  [16-30] 20 (11/30 0804) BP: (79-109)/(62-88) 97/87 (11/30 0804) SpO2:  [92 %-100 %] 100 % (11/30 0804) Weight:  [85.9 kg] 85.9 kg (11/30 0332) Last BM Date : 07/19/22 Mean arterial Pressure 80s-90s  Intake/Output:   Intake/Output Summary (Last 24 hours) at 07/20/2022 0938 Last data filed at 07/20/2022 0806 Gross per 24 hour  Intake 983.99 ml  Output 4675 ml  Net -3691.01 ml     Physical Exam  CVP 14 General:  Well appearing. No resp difficulty HEENT: Normal Neck: supple. JVP ~14/15. Carotids 2+ bilat; no bruits. No lymphadenopathy or thyromegaly appreciated. Cor: Mechanical heart sounds with LVAD hum present. Sternal  incision healing Lungs: Clear Abdomen: soft, nontender, nondistended. No hepatosplenomegaly. No bruits or masses. Good bowel sounds. Driveline: C/D/I; securement device intact and driveline incorporated Extremities: no cyanosis, clubbing, rash, edema Neuro: alert & orientedx3, cranial nerves grossly intact. moves all 4 extremities w/o difficulty. Affect pleasant   Telemetry   NSR 90s (Personally reviewed)    Labs   Basic Metabolic Panel: Recent Labs  Lab 07/15/22 0430 07/16/22 0555 07/17/22 0445 07/18/22 0531 07/19/22 0413 07/20/22 0345  NA 130* 129* 128* 128* 129* 133*  K 3.8 3.7 4.4 4.2 3.6 4.1  CL 88* 88* 91* 91* 91* 92*  CO2 27 26 24 26 27 28   GLUCOSE 174* 149* 164* 138* 79 140*  BUN 25* 23* 25* 29* 26* 27*  CREATININE 1.40* 1.22 1.34* 1.28* 1.11 1.40*  CALCIUM 8.7* 8.8* 8.6* 8.7* 8.4* 9.2  MG 2.4  --   --   --   --  2.3    Liver Function Tests: No results for input(s): "AST", "ALT", "ALKPHOS", "BILITOT", "PROT", "ALBUMIN" in the last 168 hours. No results for input(s): "LIPASE", "AMYLASE" in the last 168 hours. No results for input(s): "AMMONIA" in the last 168 hours.  CBC: Recent Labs  Lab 07/15/22 0430 07/16/22 0555 07/17/22 0445 07/18/22 0531 07/19/22 0413  WBC 15.9* 15.1* 13.2* 11.7* 9.6  HGB 7.9* 7.8* 7.6* 8.3* 8.2*  HCT 24.6* 24.6* 23.1* 26.0* 25.6*  MCV 84.5 85.7 84.6 85.8 86.2  PLT 517* 534* 551* 595* 568*    INR: Recent Labs  Lab 07/16/22 0555 07/17/22 0445 07/18/22 0531 07/19/22 0413 07/20/22 0345  INR 2.3* 2.2* 1.9* 1.9* 1.7*  :    Imaging   No results found.   Medications:     Scheduled Medications:  sodium chloride   Intravenous Once   amiodarone  200 mg Oral BID   aspirin  81 mg Oral Daily   atorvastatin  20 mg Oral Daily   bisacodyl  10 mg Oral Daily   Or   bisacodyl  10 mg Rectal Daily   Chlorhexidine Gluconate Cloth  6 each Topical Daily   digoxin  0.125 mg Oral Daily   empagliflozin  25 mg Oral Daily   Fe  Fum-Vit C-Vit B12-FA  1 capsule Oral QPC breakfast   feeding supplement  237 mL Oral TID BM   influenza vac split quadrivalent PF  0.5 mL Intramuscular Tomorrow-1000   insulin aspart  0-15 Units Subcutaneous TID WC   insulin aspart  0-5 Units Subcutaneous QHS   insulin aspart  5 Units Subcutaneous TID WC   insulin glargine-yfgn  22 Units Subcutaneous QHS   insulin glargine-yfgn  25 Units Subcutaneous Daily   levothyroxine  25 mcg Oral Q0600   lidocaine  2 patch Transdermal Q24H   magnesium oxide  400 mg Oral BID   melatonin  3 mg Oral QHS   multivitamin with minerals  1 tablet Oral Daily   pantoprazole  40 mg Oral Daily   pneumococcal 20-valent conjugate vaccine  0.5 mL Intramuscular Tomorrow-1000   polyethylene glycol  17 g Oral BID   senna-docusate  1 tablet Oral BID   sertraline  50 mg Oral Daily   sildenafil  20 mg Oral TID   sodium chloride flush  10-40 mL Intracatheter Q12H   sodium chloride flush  10-40 mL Intracatheter Q12H   sodium chloride flush  3 mL Intravenous Q12H   sodium chloride flush  3 mL Intravenous Q12H   spironolactone  25 mg Oral Daily   Warfarin - Physician Dosing Inpatient   Does not apply q1600    Infusions:  sodium chloride Stopped (07/07/22 1346)   sodium chloride     sodium chloride Stopped (07/19/22 1311)   lactated ringers     lactated ringers Stopped (07/04/22 2207)   lactated ringers Stopped (07/14/22 0801)    PRN Medications: sodium chloride, acetaminophen, albuterol, ALPRAZolam, dextrose, hydrALAZINE, lactated ringers, morphine injection, ondansetron (ZOFRAN) IV, ondansetron (ZOFRAN) IV, mouth rinse, oxyCODONE, sodium chloride flush, sodium chloride flush, sodium chloride flush, traMADol, tranexamic acid   Patient Profile   Admitted with A/C HFrEF --> cardiogenic shock. S/P HMIII LVAD  Assessment/Plan:    1. Acute on chronic systolic CHF/cardiogenic shock: Nonischemic cardiomyopathy, diagnosed 2020.  At the time, he drank heavily and  used cocaine, so it is possible that this is a substance abuse-related cardiomyopathy though LV function has remained low even with stopping ETOH and cocaine (denies use x several years).  Cath in 12/20 with no significant coronary disease.  [employer]tronic ICD. Most recent echo in 2/23 showed EF 20-25% with normal RV.  Recently, patient has been symptomatically worse, NYHA class IV with profound orthopnea. RHC showed markedly elevated filling pressures, primarily pulmonary venous hypertension, low cardiac output, and low PAPI.  Patient was admitted for inotrope and diuresis. Echo this admission with EF <20%, mod RV dysfunction.  He was started on milrinone with improvement  in hemodynamics.  We proceeded with HM3 LVAD with LAA clip on 11/14.  He is now off milrinone and remains on sildenafil 20 tid.  Co-ox 69%. CVP 14. Wt down 9 lb. Now on 2L [place]. Good flows on LVAD.  - Great diuresis yesterday, will repeat 80mg  IV lasix BID today - Continue sildenafil 20 tid.  - Continue 25 mg spiro daily.  - Continue digoxin 0.125 daily.  - Hold Jardiance 10 mg daily tomorrow with AKI, (already given today).  2. A fib RVR, post-op: H/O S/p flutter ablation.  Remains in NSR.  - Continue po amiodarone 200 BID - Continue warfarin.  3. VAD management: s/p HM-3 VAD on 11/14.  Bedside echo 11/18 LV small, VAD cannula well aligned, AoV opening every beat, septum pulling to left RV moderately HK, small to moderate anterior effusion. Ramp echo 11/22 RV mild to mod HK. Speed turned to 5400. LDH 228. MAP 70s-80s.  - Now on warfarin with INR 1.7 - Continue ASA 81. - VAD interrogated personally. Parameters stable. 4. H/o DVT: On anticoagulation.  5. Acute hypoxemic respiratory failure, post-op. 11/16 extubated.  Bilateral lower lobe infiltrates on CXR. CVP trending down but CXR has not improved, still with bilateral infiltrates and significant oxygen requirement.  He has been on amiodarone this admission for atrial fibrillation, was not  on amiodarone before this hospitalization.  Will need to think about amiodarone lung toxicity, discussed with Dr Tacy Learn who does not think particularly likely but will need to monitor for improvement.  It is also possible that he aspirated.  Sputum culture + for capnocytophaga. CCM switched to zosyn given persistent infiltrates on CXR.CT chest w/o contrast. CT chest 11/22 with diffuse interstitial and patchy alveolar opacities. He continues to have oxygen requirement but coming down, 2L [place] today.  - Encourage hourly incentive spirometry. - Encourage mobility.  - Continue Zosyn x 1 week.  6. AKI: Creatinine 1.1>1.4 today. Jardiance restarted yesterday, will hold with AKI - Diuretic management as above - will remove foley, day 16 today. Monitor I&O 7. ID: Afebrile, WBCs 13. He completed initial vanc/cefepime empiric abx for ?post-op PNA. 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 CCM switched to zosyn given persistent infiltrates on imaging - Zosyn x 1 week.  8. Hypothyroidism - On levothyroxine.  9. Hypokalemia - Continue spironolactone.  10. Epistaxis: s/p cauterization/packing 11/22, packing now removed.  11. Anemia: Hgb 7.9 => 7.8 => 7.6=>1uRBCs=>8.3=>8.2.  12. Hyponatremia: Na 133. Fluid restrict.  13. Deconditioning - PT/OT following  - CIR eventually.   [name] AGACNP-BC  07/20/2022 9:38 AM  Patient seen with NP, agree with the above note.   Stable on 3L [place].  Good diuresis yesterday, weight down.  CVP still 13 on my read.  Still coughing at times.  Co-ox 69%, creatinine mildly higher at 1.4.  INR 1.7.   MAP stable 80s-90s.   General: Well appearing this am. NAD.  HEENT: Normal. Neck: Supple, JVP 10-12 cm. Carotids OK.  Cardiac:  Mechanical heart sounds with LVAD hum present.  Lungs:  CTAB, normal effort.  Abdomen:  NT, ND, no HSM. No bruits or masses. +BS  LVAD exit site: Well-healed and incorporated. Dressing dry and intact. No erythema or drainage. Stabilization  device present and accurately applied. Driveline dressing changed daily per sterile technique. Extremities:  Warm and dry. No cyanosis, clubbing, rash, or edema.  Neuro:  Alert & oriented x 3. Cranial nerves grossly intact. Moves all 4 extremities w/o difficulty. Affect pleasant  Would continue IV Lasix at least 1 more day, hopefully to po tomorrow.  Watch creatinine closely.    Continue to mobilize.  Foley out today.  Incentive spirometry. Wean oxygen.  CIR soon.  Loralie Champagne 07/20/2022 11:25 AM

## 2022-07-20 NOTE — Progress Notes (Signed)
Mobility Specialist Progress Note    07/20/22 1139  Mobility  Activity Transferred to/from [facility]  Level of Assistance Minimal assist, patient does 75% or more  Assistive Device Other (Comment) (HHA)  Distance Ambulated (ft) 4 ft (2+2)  Activity Response Tolerated well  Mobility Referral Yes  $Mobility charge 1 Mobility   Post-Mobility: 92 HR  Sitting EOB. No complaints. Flatus. Returned to sitting EOB with call bell in reach.   Hildred Alamin Mobility Specialist  Please Psychologist, sport and exercise or Rehab Office at [phone]

## 2022-07-20 NOTE — Progress Notes (Signed)
ANTICOAGULATION CONSULT NOTE   Pharmacy Consult for warfarin Indication:  LVAD/afib  No Known Allergies  Patient Measurements: Height: 5\' 11"  (180.3 cm) Weight: 85.9 kg (189 lb 6 oz) IBW/kg (Calculated) : 75.3  Vital Signs: Temp: 98.7 F (37.1 C) (11/30 1057) Temp Source: Oral (11/30 1057) BP: 99/87 (11/30 1057) Pulse Rate: 90 (11/30 1057)  Labs: Recent Labs    07/18/22 0531 07/19/22 0413 07/20/22 0345  HGB 8.3* 8.2*  --   HCT 26.0* 25.6*  --   PLT 595* 568*  --   LABPROT 21.2* 21.5* 20.2*  INR 1.9* 1.9* 1.7*  CREATININE 1.28* 1.11 1.40*    Estimated Creatinine Clearance: 60.5 mL/min (A) (by C-G formula based on SCr of 1.4 mg/dL (H)).   Medical History: Past Medical History:  Diagnosis Date   Acute kidney injury ([place])    Acute respiratory failure (HCC)    Atrial flutter (HCC)    on Eliquis   CHF (congestive heart failure) ([place])    EF 20-25% 10/15/21, AICD in place.   Coronary artery disease    Diabetes mellitus without complication (HCC)    DVT (deep venous thrombosis) ([place]) 06/2019   Heart murmur    Hypertension    Presence of permanent cardiac pacemaker    Renal disorder     Assessment: 39 yom admitted with HF now s/p HM3 LVAD with LAA clip on 11/14. On apixaban PTA for hx Aflutter. Warfarin started on 11/17, previously managed by CTS. Pharmacy consulted to manage warfarin dosing starting 11/30. INR subtherapeutic and trending down today at 1.7. Hemoglobin of 8.2 on 11/29. He received 1U PRBC on 11/27. LDH has been in the 200s and stable. 30 days of Aspirin 81 mg will end on 12/14.   Goal of Therapy:  INR 2-2.5 Monitor platelets by anticoagulation protocol: Yes   Plan:  Warfarin 5 mg x 1 Daily PT-INR, CBC  Monitor s/sx bleeding   Eliseo Gum, PharmD PGY1 Pharmacy Resident   07/20/2022  1:57 PM

## 2022-07-20 NOTE — Discharge Summary (Signed)
Advanced Heart Failure Team  Discharge Summary   Patient ID: Darryl Frye MRN: [id number], DOB/AGE: 26-Jun-1961 61 y.o. Admit date: 06/27/2022 D/C date:     07/21/2022   Primary Discharge Diagnoses:  Acute on chronic systolic CHF, NICM Cardiogenic shock Afib RVR VAD Acute hypoxemic respiratory failure AKI Capnocytophagia  Secondary Discharge Diagnoses:  H/o DVT Hypothyroidism Hypokalemia Epistaxis Anemia Hyponatremia Deconditioning  Hospital Course:  [name] is a 60 y.o. with history of atrial fibrillation/flutter and nonischemic cardiomyopathy was initially referred by Dr. Harl Bowie for evaluation of CHF.  Patient was diagnosed with CHF in 2020 at [facility] in [place].  At the time, he drank heavily and used cocaine.  Stopped in 2020. Echo in 11/20 showed EF < 10%, cath in 12/20 showed normal coronaries.      Recently had presented to clinic with progressive orthopnea. He was taking all medications as prescribed and weight kept going up. He had been seen in clinic multiple times with worsening S&S and underwent a RHC. It showed markedly elevated filling pressures, primarily pulmonary venous hypertension, low cardiac output, and low PAPi. Admitted for inotropes and diuresis.  Started on milrinone and underwent repeat RHC while on inotropic support with similar findings. Decision made to proceed with LVAD placement. Underwent LVAD placement and L atrial appendage closure 07/04/22. Post-op complicated by shock, pneumonia, anemia, epistaxis, acute hypoxemic respiratory failure, and a fib RVR. Resolved with brief pressors, antibiotics, diuresis and nasal cautery and packing per ENT. Ultimately weaned off all gtts. Stable at discharge.   INR goal is 2.0 - 2.5.  On warfarin.   Pt will continue to be followed closely in the VAD/HF clinic once discharged from CIR. Dr Aundra Dubin evaluated and deemed appropriate for discharge, will continue to follow while in CIR.   See below for detailed problem  list: 1. Acute on chronic systolic CHF/cardiogenic shock: Nonischemic cardiomyopathy, diagnosed 2020.  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  Cath in 12/20 with no significant coronary disease.  [employer] ICD. Most recent echo in 2/23 showed EF 20-25% with normal RV.  Recently, patient has been symptomatically worse, NYHA class IV with profound orthopnea. RHC showed markedly elevated filling pressures, primarily pulmonary venous hypertension, low cardiac output, and low PAPI.  Patient was admitted for inotrope and diuresis. Echo this admission with EF <20%, mod RV dysfunction.  He was started on milrinone with improvement in hemodynamics.  We proceeded with HM3 LVAD with LAA clip on 11/14.  He is now off milrinone and remains on sildenafil 20 tid. CVP 9/10. Now on 2L [place]. Good flows on LVAD.  - Great diuresis yesterday with 80 IV lasix. CVP lowered. Will transition to torsemide 40 mg bid.  - Continue sildenafil 20 tid.  - Continue 25 mg spiro daily.  - Continue digoxin 0.125 daily, level ok today.  - Continue Jardiance 10 mg daily 2. A fib RVR, post-op: H/O S/p flutter ablation.  Remains in NSR.  - Continue po amiodarone 200 BID - Continue warfarin.  3. VAD management: s/p HM-3 VAD on 11/14.  Bedside echo 11/18 LV small, VAD cannula well aligned, AoV opening every beat, septum pulling to left RV moderately HK, small to moderate anterior effusion. Ramp echo 11/22 RV mild to mod HK. Speed turned to 5400. LDH 228. MAP 70s-80s.  - Now on warfarin  with INR 1.9 - Continue ASA 81. - VAD interrogated personally. Parameters stable. 4. H/o DVT: On anticoagulation.  5. Acute hypoxemic respiratory failure, post-op. 11/16 extubated.  Bilateral lower lobe infiltrates on CXR. CVP trending down but CXR has not improved, still with  bilateral infiltrates and significant oxygen requirement.  He has been on amiodarone this admission for atrial fibrillation, was not on amiodarone before this hospitalization.  Will need to think about amiodarone lung toxicity, discussed with Dr Tacy Learn who does not think particularly likely but will need to monitor for improvement.  It is also possible that he aspirated.  Sputum culture + for capnocytophaga. CCM switched to zosyn given persistent infiltrates on CXR.CT chest w/o contrast. CT chest 11/22 with diffuse interstitial and patchy alveolar opacities. He continues to have oxygen requirement but coming down, 2L [place] today.  - Encourage hourly incentive spirometry. - Encourage mobility.  - Completed 1 week of Zosyn.  6. AKI: Creatinine 1.1>1.4>1.18 today. - Diuretic management as above 7. ID: Afebrile, WBCs 13>10.7. He completed initial vanc/cefepime empiric abx for ?post-op PNA. 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 CCM switched to zosyn given persistent infiltrates on imaging - Completed course of Zosyn.  8. Hypothyroidism - On levothyroxine.  9. Hypokalemia - Continue spironolactone.  10. Epistaxis: s/p cauterization/packing 11/22, packing now removed.  11. Anemia: Hgb 7.9 => 7.8 => 7.6=>1uRBCs=>8.3=>8.2=>9.1.  12. Hyponatremia: Na 134. Fluid restrict.  13. Deconditioning - PT/OT following  - To CIR  LVAD Interrogation HM II:   Speed: 5300    Flow: 4.7  PI: 3.1  Power: 4   Back-up speed: 5000    Discharge Weight Range: 190.5lbs Discharge Vitals: Blood pressure 100/79, pulse 82, temperature 98.1 F (36.7 C), temperature source Oral, resp. rate 20, height _0  (1.803 m), weight 86.4 kg, SpO2 93 %.  Labs: Lab Results  Component Value Date   WBC 10.7 (H) 07/21/2022   HGB 9.1 (L) 07/21/2022   HCT 28.5 (L) 07/21/2022   MCV 85.3 07/21/2022   PLT 629 (H) 07/21/2022    Recent Labs  Lab 07/21/22 0310  NA 134*  K 4.3  CL 90*  CO2 27  BUN 33*  CREATININE 1.18   CALCIUM 9.8  GLUCOSE 138*   Lab Results  Component Value Date   CHOL 212 (H) 06/27/2022   HDL 55 06/27/2022   LDLCALC 130 (H) 06/27/2022   TRIG 134 06/27/2022   BNP (last 3 results) Recent Labs    07/05/22 0401 07/10/22 2349 07/18/22 0044  BNP 512.0* 679.3* 438.1*    ProBNP (last 3 results) No results for input(s): "PROBNP" in the last 8760 hours.   Diagnostic Studies/Procedures   No results found.  Discharge Medications   Allergies as of 07/21/2022   No Known Allergies      Medication List     STOP taking these medications    apixaban 5 MG Tabs tablet Commonly known as: ELIQUIS   Entresto 24-26 MG Generic drug: sacubitril-valsartan   furosemide 20 MG tablet Commonly known as: LASIX   insulin glargine 100 UNIT/ML Solostar Pen Commonly known as: LANTUS Replaced by: insulin glargine-yfgn 100 UNIT/ML injection   magnesium oxide 400 MG tablet Commonly known as: MAG-OX   metFORMIN 1000 MG tablet Commonly known as: GLUCOPHAGE   metoprolol succinate 25 MG 24 hr tablet Commonly known as: TOPROL-XL       TAKE these medications    albuterol (2.5 MG/3ML) 0.083% nebulizer solution Commonly known as: PROVENTIL Take 3 mLs (2.5  mg total) by nebulization every 4 (four) hours as needed for wheezing or shortness of breath.   amiodarone 200 MG tablet Commonly known as: PACERONE Take 1 tablet (200 mg total) by mouth 2 (two) times daily.   aspirin 81 MG chewable tablet Chew 1 tablet (81 mg total) by mouth daily. Start taking on: July 22, 2022   atorvastatin 20 MG tablet Commonly known as: LIPITOR Take 20 mg by mouth daily.   bisacodyl 5 MG EC tablet Commonly known as: DULCOLAX Take 2 tablets (10 mg total) by mouth daily. Start taking on: July 22, 2022   bisacodyl 10 MG suppository Commonly known as: DULCOLAX Place 1 suppository (10 mg total) rectally daily. Start taking on: July 22, 2022   Chlorhexidine Gluconate Cloth 2 % Pads Apply 6  each topically daily.   dextrose 50 % solution Inject 0-50 mLs into the vein as needed for low blood sugar.   digoxin 0.125 MG tablet Commonly known as: LANOXIN Take 1 tablet (0.125 mg total) by mouth daily. Start taking on: July 22, 2022   empagliflozin 10 MG Tabs tablet Commonly known as: JARDIANCE Take 1 tablet (10 mg total) by mouth daily. Start taking on: July 22, 2022 What changed:  medication strength how much to take   Fe Fum-Vit C-Vit B12-FA Caps capsule Commonly known as: TRIGELS-F FORTE Take 1 capsule by mouth daily after breakfast. Start taking on: July 22, 2022   feeding supplement Liqd Take 237 mLs by mouth 3 (three) times daily between meals.   hydrALAZINE 20 MG/ML injection Commonly known as: APRESOLINE Inject 1 mL (20 mg total) into the vein every 4 (four) hours as needed.   influenza vac split quadrivalent PF 0.5 ML injection Commonly known as: FLUARIX Inject 0.5 mLs into the muscle tomorrow at 10 am for 1 dose.   insulin aspart 100 UNIT/ML injection Commonly known as: novoLOG Inject 0-15 Units into the skin 3 (three) times daily with meals.   insulin aspart 100 UNIT/ML injection Commonly known as: novoLOG Inject 0-5 Units into the skin at bedtime.   insulin aspart 100 UNIT/ML injection Commonly known as: novoLOG Inject 5 Units into the skin 3 (three) times daily with meals.   insulin glargine-yfgn 100 UNIT/ML injection Commonly known as: SEMGLEE Inject 0.22 mLs (22 Units total) into the skin at bedtime.   insulin glargine-yfgn 100 UNIT/ML injection Commonly known as: SEMGLEE Inject 0.25 mLs (25 Units total) into the skin daily. Start taking on: July 22, 2022 Replaces: insulin glargine 100 UNIT/ML Solostar Pen   levothyroxine 25 MCG tablet Commonly known as: SYNTHROID Take 1 tablet (25 mcg total) by mouth daily at 6 (six) AM. Start taking on: July 22, 2022 What changed: when to take this   lidocaine 5 % Commonly known as:  LIDODERM Place 2 patches onto the skin daily. Remove & Discard patch within 12 hours or as directed by MD   magnesium oxide 400 (240 Mg) MG tablet Commonly known as: MAG-OX Take 1 tablet (400 mg total) by mouth 2 (two) times daily.   melatonin 3 MG Tabs tablet Take 1 tablet (3 mg total) by mouth at bedtime.   mouth rinse Liqd solution 15 mLs by Mouth Rinse route as needed (for oral care).   multivitamin with minerals Tabs tablet Take 1 tablet by mouth daily. Start taking on: July 22, 2022   ondansetron 4 MG/2ML Soln injection Commonly known as: ZOFRAN Inject 2 mLs (4 mg total) into the vein every 6 (six) hours  as needed for nausea.   ondansetron 4 MG/2ML Soln injection Commonly known as: ZOFRAN Inject 2 mLs (4 mg total) into the vein every 6 (six) hours as needed for nausea or vomiting.   pantoprazole 40 MG tablet Commonly known as: PROTONIX Take 1 tablet (40 mg total) by mouth daily. Start taking on: July 22, 2022   pneumococcal 20-valent conjugate vaccine 0.5 ML injection Commonly known as: PREVNAR 20 Inject 0.5 mLs into the muscle tomorrow at 10 am for 1 dose.   polyethylene glycol 17 g packet Commonly known as: MIRALAX / GLYCOLAX Take 17 g by mouth 2 (two) times daily.   senna-docusate 8.6-50 MG tablet Commonly known as: Senokot-S Take 1 tablet by mouth 2 (two) times daily.   sertraline 50 MG tablet Commonly known as: ZOLOFT Take 50 mg by mouth daily.   sildenafil 20 MG tablet Commonly known as: REVATIO Take 1 tablet (20 mg total) by mouth 3 (three) times daily.   spironolactone 25 MG tablet Commonly known as: ALDACTONE Take 1 tablet (25 mg total) by mouth daily.   Torsemide 40 MG Tabs Take 40 mg by mouth 2 (two) times daily.   traMADol 50 MG tablet Commonly known as: ULTRAM Take 1-2 tablets (50-100 mg total) by mouth every 4 (four) hours as needed for moderate pain.   warfarin 3 MG tablet Commonly known as: COUMADIN Take 1 tablet (3 mg total)  by mouth daily at 4 PM.        Disposition   The patient will be discharged in stable condition to home. Discharge Instructions     (HEART FAILURE PATIENTS) Call MD:  Anytime you have any of the following symptoms: 1) 3 pound weight gain in 24 hours or 5 pounds in 1 week 2) shortness of breath, with or without a dry hacking cough 3) swelling in the hands, feet or stomach 4) if you have to sleep on extra pillows at night in order to breathe.   Complete by: As directed    Amb Referral to Cardiac Rehabilitation   Complete by: As directed    Diagnosis:  Other Heart Failure (see criteria below if ordering Phase II)     Heart Failure Type: Chronic Systolic & Diastolic Comment - LVAD   After initial evaluation and assessments completed: Virtual Based Care may be provided alone or in conjunction with Phase 2 Cardiac Rehab based on patient barriers.: Yes   Intensive Cardiac Rehabilitation (ICR) [place] location only OR Traditional Cardiac Rehabilitation (TCR) *If criteria for ICR are not met will enroll in TCR [facility] only): Yes   Diet - low sodium heart healthy   Complete by: As directed    Increase activity slowly   Complete by: As directed    No wound care   Complete by: As directed           Duration of Discharge Encounter: Greater than 35 minutes   Signed, [name] AGACNP-BC  07/21/2022, 3:34 PM

## 2022-07-20 NOTE — Progress Notes (Signed)
IP rehab admissions - Patient not ready today for CIR admission.  Will have my partner follow up daily.  [phone]

## 2022-07-21 ENCOUNTER — Encounter (HOSPITAL_COMMUNITY): Payer: Self-pay | Admitting: Physical Medicine and Rehabilitation

## 2022-07-21 ENCOUNTER — Inpatient Hospital Stay (HOSPITAL_COMMUNITY)
Admission: RE | Admit: 2022-07-21 | Discharge: 2022-07-31 | DRG: 945 | Disposition: A | Discharge status: Home / Self Care | Payer: Medicare Other | Source: Intra-hospital | Attending: Physical Medicine and Rehabilitation | Admitting: Physical Medicine and Rehabilitation

## 2022-07-21 ENCOUNTER — Other Ambulatory Visit (HOSPITAL_COMMUNITY): Payer: Self-pay

## 2022-07-21 ENCOUNTER — Other Ambulatory Visit: Payer: Self-pay

## 2022-07-21 DIAGNOSIS — Z66 Do not resuscitate: Secondary | ICD-10-CM | POA: Diagnosis present

## 2022-07-21 DIAGNOSIS — Z7984 Long term (current) use of oral hypoglycemic drugs: Secondary | ICD-10-CM

## 2022-07-21 DIAGNOSIS — Z8249 Family history of ischemic heart disease and other diseases of the circulatory system: Secondary | ICD-10-CM

## 2022-07-21 DIAGNOSIS — R040 Epistaxis: Secondary | ICD-10-CM | POA: Diagnosis not present

## 2022-07-21 DIAGNOSIS — I4892 Unspecified atrial flutter: Secondary | ICD-10-CM | POA: Diagnosis present

## 2022-07-21 DIAGNOSIS — I110 Hypertensive heart disease with heart failure: Secondary | ICD-10-CM | POA: Diagnosis present

## 2022-07-21 DIAGNOSIS — I5022 Chronic systolic (congestive) heart failure: Secondary | ICD-10-CM | POA: Diagnosis present

## 2022-07-21 DIAGNOSIS — I5023 Acute on chronic systolic (congestive) heart failure: Secondary | ICD-10-CM | POA: Diagnosis not present

## 2022-07-21 DIAGNOSIS — I5043 Acute on chronic combined systolic (congestive) and diastolic (congestive) heart failure: Secondary | ICD-10-CM

## 2022-07-21 DIAGNOSIS — I509 Heart failure, unspecified: Secondary | ICD-10-CM | POA: Diagnosis not present

## 2022-07-21 DIAGNOSIS — N179 Acute kidney failure, unspecified: Secondary | ICD-10-CM | POA: Diagnosis present

## 2022-07-21 DIAGNOSIS — E785 Hyperlipidemia, unspecified: Secondary | ICD-10-CM | POA: Diagnosis present

## 2022-07-21 DIAGNOSIS — R7989 Other specified abnormal findings of blood chemistry: Secondary | ICD-10-CM | POA: Diagnosis not present

## 2022-07-21 DIAGNOSIS — E559 Vitamin D deficiency, unspecified: Secondary | ICD-10-CM | POA: Diagnosis present

## 2022-07-21 DIAGNOSIS — Z95811 Presence of heart assist device: Secondary | ICD-10-CM

## 2022-07-21 DIAGNOSIS — I132 Hypertensive heart and chronic kidney disease with heart failure and with stage 5 chronic kidney disease, or end stage renal disease: Secondary | ICD-10-CM | POA: Diagnosis present

## 2022-07-21 DIAGNOSIS — Z8616 Personal history of COVID-19: Secondary | ICD-10-CM | POA: Diagnosis not present

## 2022-07-21 DIAGNOSIS — I272 Pulmonary hypertension, unspecified: Secondary | ICD-10-CM | POA: Diagnosis present

## 2022-07-21 DIAGNOSIS — R5381 Other malaise: Secondary | ICD-10-CM | POA: Diagnosis present

## 2022-07-21 DIAGNOSIS — E039 Hypothyroidism, unspecified: Secondary | ICD-10-CM | POA: Diagnosis present

## 2022-07-21 DIAGNOSIS — E871 Hypo-osmolality and hyponatremia: Secondary | ICD-10-CM | POA: Diagnosis present

## 2022-07-21 DIAGNOSIS — I428 Other cardiomyopathies: Secondary | ICD-10-CM | POA: Diagnosis present

## 2022-07-21 DIAGNOSIS — Z79890 Hormone replacement therapy (postmenopausal): Secondary | ICD-10-CM

## 2022-07-21 DIAGNOSIS — Z23 Encounter for immunization: Secondary | ICD-10-CM

## 2022-07-21 DIAGNOSIS — Z95810 Presence of automatic (implantable) cardiac defibrillator: Secondary | ICD-10-CM

## 2022-07-21 DIAGNOSIS — N186 End stage renal disease: Secondary | ICD-10-CM | POA: Diagnosis present

## 2022-07-21 DIAGNOSIS — F4321 Adjustment disorder with depressed mood: Secondary | ICD-10-CM

## 2022-07-21 DIAGNOSIS — D649 Anemia, unspecified: Secondary | ICD-10-CM | POA: Diagnosis present

## 2022-07-21 DIAGNOSIS — Z833 Family history of diabetes mellitus: Secondary | ICD-10-CM

## 2022-07-21 DIAGNOSIS — Z7901 Long term (current) use of anticoagulants: Secondary | ICD-10-CM

## 2022-07-21 DIAGNOSIS — Z794 Long term (current) use of insulin: Secondary | ICD-10-CM | POA: Diagnosis not present

## 2022-07-21 DIAGNOSIS — Z7982 Long term (current) use of aspirin: Secondary | ICD-10-CM

## 2022-07-21 DIAGNOSIS — E1122 Type 2 diabetes mellitus with diabetic chronic kidney disease: Secondary | ICD-10-CM | POA: Diagnosis present

## 2022-07-21 DIAGNOSIS — I2510 Atherosclerotic heart disease of native coronary artery without angina pectoris: Secondary | ICD-10-CM | POA: Diagnosis present

## 2022-07-21 DIAGNOSIS — I480 Paroxysmal atrial fibrillation: Secondary | ICD-10-CM | POA: Diagnosis present

## 2022-07-21 DIAGNOSIS — F32A Depression, unspecified: Secondary | ICD-10-CM | POA: Diagnosis present

## 2022-07-21 DIAGNOSIS — Z79899 Other long term (current) drug therapy: Secondary | ICD-10-CM

## 2022-07-21 DIAGNOSIS — E876 Hypokalemia: Secondary | ICD-10-CM | POA: Diagnosis present

## 2022-07-21 DIAGNOSIS — E119 Type 2 diabetes mellitus without complications: Secondary | ICD-10-CM | POA: Diagnosis present

## 2022-07-21 DIAGNOSIS — Z8042 Family history of malignant neoplasm of prostate: Secondary | ICD-10-CM

## 2022-07-21 DIAGNOSIS — Z87891 Personal history of nicotine dependence: Secondary | ICD-10-CM

## 2022-07-21 DIAGNOSIS — Z86718 Personal history of other venous thrombosis and embolism: Secondary | ICD-10-CM

## 2022-07-21 DIAGNOSIS — I5021 Acute systolic (congestive) heart failure: Secondary | ICD-10-CM | POA: Diagnosis not present

## 2022-07-21 DIAGNOSIS — R570 Cardiogenic shock: Secondary | ICD-10-CM | POA: Diagnosis not present

## 2022-07-21 LAB — LACTATE DEHYDROGENASE: LDH: 247 U/L — ABNORMAL HIGH (ref 98–192)

## 2022-07-21 LAB — COOXEMETRY PANEL
Carboxyhemoglobin: 1.7 % — ABNORMAL HIGH (ref 0.5–1.5)
Carboxyhemoglobin: 2.1 % — ABNORMAL HIGH (ref 0.5–1.5)
Carboxyhemoglobin: 2.2 % — ABNORMAL HIGH (ref 0.5–1.5)
Methemoglobin: 1.4 % (ref 0.0–1.5)
Methemoglobin: <0.7 % (ref 0.0–1.5)
Methemoglobin: 0.8 % (ref 0.0–1.5)
O2 Saturation: 50.4 %
O2 Saturation: 43.8 %
O2 Saturation: 51.5 %
Total hemoglobin: 9.7 g/dL — ABNORMAL LOW (ref 12.0–16.0)
Total hemoglobin: 11.4 g/dL — ABNORMAL LOW (ref 12.0–16.0)
Total hemoglobin: 9.3 g/dL — ABNORMAL LOW (ref 12.0–16.0)

## 2022-07-21 LAB — BASIC METABOLIC PANEL
Anion gap: 17 — ABNORMAL HIGH (ref 5–15)
BUN: 33 mg/dL — ABNORMAL HIGH (ref 6–20)
CO2: 27 mmol/L (ref 22–32)
Calcium: 9.8 mg/dL (ref 8.9–10.3)
Chloride: 90 mmol/L — ABNORMAL LOW (ref 98–111)
Creatinine, Ser: 1.18 mg/dL (ref 0.61–1.24)
GFR, Estimated: >60 mL/min (ref >60)
Glucose, Bld: 138 mg/dL — ABNORMAL HIGH (ref 70–99)
Potassium: 4.3 mmol/L (ref 3.5–5.1)
Sodium: 134 mmol/L — ABNORMAL LOW (ref 135–145)

## 2022-07-21 LAB — CBC
HCT: 28.5 % — ABNORMAL LOW (ref 39.0–52.0)
Hemoglobin: 9.1 g/dL — ABNORMAL LOW (ref 13.0–17.0)
MCH: 27.2 pg (ref 26.0–34.0)
MCHC: 31.9 g/dL (ref 30.0–36.0)
MCV: 85.3 fL (ref 80.0–100.0)
Platelets: 629 10*3/uL — ABNORMAL HIGH (ref 150–400)
RBC: 3.34 MIL/uL — ABNORMAL LOW (ref 4.22–5.81)
RDW: 16.6 % — ABNORMAL HIGH (ref 11.5–15.5)
WBC: 10.7 10*3/uL — ABNORMAL HIGH (ref 4.0–10.5)
nRBC: 0 % (ref 0.0–0.2)

## 2022-07-21 LAB — PROTIME-INR
INR: 1.9 — ABNORMAL HIGH (ref 0.8–1.2)
Prothrombin Time: 21.5 seconds — ABNORMAL HIGH (ref 11.4–15.2)

## 2022-07-21 LAB — GLUCOSE, CAPILLARY
Glucose-Capillary: 129 mg/dL — ABNORMAL HIGH (ref 70–99)
Glucose-Capillary: 104 mg/dL — ABNORMAL HIGH (ref 70–99)
Glucose-Capillary: 152 mg/dL — ABNORMAL HIGH (ref 70–99)
Glucose-Capillary: 121 mg/dL — ABNORMAL HIGH (ref 70–99)

## 2022-07-21 LAB — DIGOXIN LEVEL: Digoxin Level: 0.6 ng/mL — ABNORMAL LOW (ref 0.8–2.0)

## 2022-07-21 MED ORDER — PANTOPRAZOLE SODIUM 40 MG PO TBEC: 40.0000 mg | DELAYED_RELEASE_TABLET | Freq: Every day | ORAL | Status: DC

## 2022-07-21 MED ORDER — MAGNESIUM OXIDE -MG SUPPLEMENT 400 (240 MG) MG PO TABS: 400.0000 mg | ORAL_TABLET | Freq: Two times a day (BID) | ORAL | Status: DC

## 2022-07-21 MED ORDER — TORSEMIDE 20 MG PO TABS
40.0000 mg | ORAL_TABLET | Freq: Two times a day (BID) | ORAL | Status: DC
  Administered 2022-07-21: 40 mg via ORAL
  Filled 2022-07-21: qty 2

## 2022-07-21 MED ORDER — TORSEMIDE 20 MG PO TABS: 40.0000 mg | ORAL_TABLET | Freq: Every day | ORAL | Status: DC

## 2022-07-21 MED ORDER — SODIUM CHLORIDE 0.45 % IV SOLN: INTRAVENOUS | Status: DC | PRN

## 2022-07-21 MED ORDER — PANTOPRAZOLE SODIUM 40 MG PO TBEC
40.0000 mg | DELAYED_RELEASE_TABLET | Freq: Every day | ORAL | Status: DC
  Administered 2022-07-22 – 2022-07-31 (×10): 40 mg via ORAL
  Filled 2022-07-21 (×9): qty 1

## 2022-07-21 MED ORDER — ORAL CARE MOUTH RINSE: 15.0000 mL | OROMUCOSAL | 0 refills | Status: DC | PRN

## 2022-07-21 MED ORDER — SENNOSIDES-DOCUSATE SODIUM 8.6-50 MG PO TABS: 1.0000 | ORAL_TABLET | Freq: Two times a day (BID) | ORAL | Status: DC

## 2022-07-21 MED ORDER — EMPAGLIFLOZIN 10 MG PO TABS: 10.0000 mg | ORAL_TABLET | Freq: Every day | ORAL | Status: DC

## 2022-07-21 MED ORDER — SODIUM CHLORIDE 0.9 % IV SOLN: INTRAVENOUS | Status: DC

## 2022-07-21 MED ORDER — FE FUM-VIT C-VIT B12-FA 460-60-0.01-1 MG PO CAPS
1.0000 | ORAL_CAPSULE | Freq: Every day | ORAL | Status: DC
  Administered 2022-07-22 – 2022-07-31 (×10): 1 via ORAL
  Filled 2022-07-21 (×10): qty 1

## 2022-07-21 MED ORDER — POLYETHYLENE GLYCOL 3350 17 G PO PACK: 17.0000 g | PACK | Freq: Two times a day (BID) | ORAL | 0 refills | Status: DC

## 2022-07-21 MED ORDER — WARFARIN SODIUM 3 MG PO TABS: 3.0000 mg | ORAL_TABLET | Freq: Every day | ORAL | Status: DC

## 2022-07-21 MED ORDER — INSULIN ASPART 100 UNIT/ML IJ SOLN
5.0000 [IU] | Freq: Three times a day (TID) | INTRAMUSCULAR | Status: DC
  Administered 2022-07-21 – 2022-07-25 (×7): 5 [IU] via SUBCUTANEOUS

## 2022-07-21 MED ORDER — SENNOSIDES-DOCUSATE SODIUM 8.6-50 MG PO TABS
1.0000 | ORAL_TABLET | Freq: Two times a day (BID) | ORAL | Status: DC
  Administered 2022-07-21 – 2022-07-31 (×20): 1 via ORAL
  Filled 2022-07-21 (×20): qty 1

## 2022-07-21 MED ORDER — PROCHLORPERAZINE 25 MG RE SUPP: 12.5000 mg | Freq: Four times a day (QID) | RECTAL | Status: DC | PRN

## 2022-07-21 MED ORDER — TRAMADOL HCL 50 MG PO TABS: 50.0000 mg | ORAL_TABLET | ORAL | Status: DC | PRN

## 2022-07-21 MED ORDER — INSULIN ASPART 100 UNIT/ML IJ SOLN: 0.0000 [IU] | Freq: Three times a day (TID) | INTRAMUSCULAR | 11 refills | Status: DC

## 2022-07-21 MED ORDER — ALBUTEROL SULFATE (2.5 MG/3ML) 0.083% IN NEBU: 2.5000 mg | INHALATION_SOLUTION | RESPIRATORY_TRACT | Status: DC | PRN

## 2022-07-21 MED ORDER — SERTRALINE HCL 50 MG PO TABS
50.0000 mg | ORAL_TABLET | Freq: Every day | ORAL | Status: DC
  Administered 2022-07-22 – 2022-07-31 (×10): 50 mg via ORAL
  Filled 2022-07-21 (×10): qty 1

## 2022-07-21 MED ORDER — TORSEMIDE 20 MG PO TABS
40.0000 mg | ORAL_TABLET | Freq: Two times a day (BID) | ORAL | Status: DC
  Administered 2022-07-21 – 2022-07-26 (×10): 40 mg via ORAL
  Filled 2022-07-21 (×10): qty 2

## 2022-07-21 MED ORDER — ONDANSETRON HCL 4 MG/2ML IJ SOLN: 4.0000 mg | Freq: Four times a day (QID) | INTRAMUSCULAR | 0 refills | Status: DC | PRN

## 2022-07-21 MED ORDER — INSULIN ASPART 100 UNIT/ML IJ SOLN
0.0000 [IU] | Freq: Three times a day (TID) | INTRAMUSCULAR | Status: DC
  Administered 2022-07-21: 3 [IU] via SUBCUTANEOUS
  Administered 2022-07-22: 1 [IU] via SUBCUTANEOUS
  Administered 2022-07-23: 3 [IU] via SUBCUTANEOUS
  Administered 2022-07-23 – 2022-07-24 (×2): 1 [IU] via SUBCUTANEOUS
  Administered 2022-07-25: 2 [IU] via SUBCUTANEOUS
  Administered 2022-07-26 – 2022-07-28 (×3): 3 [IU] via SUBCUTANEOUS
  Administered 2022-07-29: 2 [IU] via SUBCUTANEOUS
  Administered 2022-07-29: 5 [IU] via SUBCUTANEOUS
  Administered 2022-07-30: 2 [IU] via SUBCUTANEOUS
  Administered 2022-07-30: 3 [IU] via SUBCUTANEOUS
  Administered 2022-07-31: 2 [IU] via SUBCUTANEOUS

## 2022-07-21 MED ORDER — ORAL CARE MOUTH RINSE: 15.0000 mL | OROMUCOSAL | Status: DC | PRN

## 2022-07-21 MED ORDER — CHLORHEXIDINE GLUCONATE CLOTH 2 % EX PADS
6.0000 | MEDICATED_PAD | Freq: Every day | CUTANEOUS | Status: DC
  Administered 2022-07-22 – 2022-07-25 (×4): 6 via TOPICAL

## 2022-07-21 MED ORDER — SILDENAFIL CITRATE 20 MG PO TABS: 20.0000 mg | ORAL_TABLET | Freq: Three times a day (TID) | ORAL | 0 refills | Status: DC

## 2022-07-21 MED ORDER — POLYETHYLENE GLYCOL 3350 17 G PO PACK: 17.0000 g | PACK | Freq: Every day | ORAL | Status: DC | PRN

## 2022-07-21 MED ORDER — BISACODYL 5 MG PO TBEC: 10.0000 mg | DELAYED_RELEASE_TABLET | Freq: Every day | ORAL | 0 refills | Status: DC

## 2022-07-21 MED ORDER — HYDRALAZINE HCL 20 MG/ML IJ SOLN: 20.0000 mg | INTRAMUSCULAR | Status: DC | PRN

## 2022-07-21 MED ORDER — INSULIN GLARGINE-YFGN 100 UNIT/ML ~~LOC~~ SOLN: 22.0000 [IU] | Freq: Every day | SUBCUTANEOUS | 11 refills | Status: DC

## 2022-07-21 MED ORDER — ENSURE ENLIVE PO LIQD: 237.0000 mL | Freq: Three times a day (TID) | ORAL | 12 refills | Status: DC

## 2022-07-21 MED ORDER — LIDOCAINE 5 % EX PTCH: 2.0000 | MEDICATED_PATCH | CUTANEOUS | 0 refills | Status: DC

## 2022-07-21 MED ORDER — LEVOTHYROXINE SODIUM 25 MCG PO TABS: 25.0000 ug | ORAL_TABLET | Freq: Every day | ORAL | Status: DC

## 2022-07-21 MED ORDER — ASPIRIN 81 MG PO CHEW
81.0000 mg | CHEWABLE_TABLET | Freq: Every day | ORAL | Status: DC
  Administered 2022-07-22 – 2022-07-31 (×10): 81 mg via ORAL
  Filled 2022-07-21 (×10): qty 1

## 2022-07-21 MED ORDER — INSULIN ASPART 100 UNIT/ML IJ SOLN: 0.0000 [IU] | Freq: Every day | INTRAMUSCULAR | 11 refills | Status: DC

## 2022-07-21 MED ORDER — POLYETHYLENE GLYCOL 3350 17 G PO PACK
17.0000 g | PACK | Freq: Two times a day (BID) | ORAL | Status: DC
  Administered 2022-07-21 – 2022-07-31 (×20): 17 g via ORAL
  Filled 2022-07-21 (×20): qty 1

## 2022-07-21 MED ORDER — TORSEMIDE 40 MG PO TABS: 40.0000 mg | ORAL_TABLET | Freq: Two times a day (BID) | ORAL | Status: DC

## 2022-07-21 MED ORDER — PROCHLORPERAZINE EDISYLATE 10 MG/2ML IJ SOLN: 5.0000 mg | Freq: Four times a day (QID) | INTRAMUSCULAR | Status: DC | PRN

## 2022-07-21 MED ORDER — DEXTROSE 50 % IV SOLN: 0.0000 mL | INTRAVENOUS | Status: DC | PRN

## 2022-07-21 MED ORDER — INSULIN ASPART 100 UNIT/ML IJ SOLN
0.0000 [IU] | Freq: Every day | INTRAMUSCULAR | Status: DC
  Administered 2022-07-22: 3 [IU] via SUBCUTANEOUS
  Administered 2022-07-23 – 2022-07-26 (×3): 2 [IU] via SUBCUTANEOUS

## 2022-07-21 MED ORDER — BISACODYL 10 MG RE SUPP: 10.0000 mg | Freq: Every day | RECTAL | 0 refills | Status: DC

## 2022-07-21 MED ORDER — AMIODARONE HCL 200 MG PO TABS: 200.0000 mg | ORAL_TABLET | Freq: Two times a day (BID) | ORAL | Status: DC

## 2022-07-21 MED ORDER — AMIODARONE HCL 200 MG PO TABS
200.0000 mg | ORAL_TABLET | Freq: Two times a day (BID) | ORAL | Status: DC
  Administered 2022-07-21 – 2022-07-28 (×14): 200 mg via ORAL
  Filled 2022-07-21 (×14): qty 1

## 2022-07-21 MED ORDER — ATORVASTATIN CALCIUM 10 MG PO TABS
20.0000 mg | ORAL_TABLET | Freq: Every day | ORAL | Status: DC
  Administered 2022-07-22 – 2022-07-31 (×10): 20 mg via ORAL
  Filled 2022-07-21 (×10): qty 2

## 2022-07-21 MED ORDER — INFLUENZA VAC SPLIT QUAD 0.5 ML IM SUSY: 0.5000 mL | PREFILLED_SYRINGE | INTRAMUSCULAR | 0 refills | Status: DC

## 2022-07-21 MED ORDER — SILDENAFIL CITRATE 20 MG PO TABS
20.0000 mg | ORAL_TABLET | Freq: Three times a day (TID) | ORAL | Status: DC
  Administered 2022-07-21 – 2022-07-31 (×29): 20 mg via ORAL
  Filled 2022-07-21 (×33): qty 1

## 2022-07-21 MED ORDER — ACETAMINOPHEN 325 MG PO TABS
325.0000 mg | ORAL_TABLET | ORAL | Status: DC | PRN
  Administered 2022-07-27: 650 mg via ORAL
  Filled 2022-07-21: qty 2

## 2022-07-21 MED ORDER — DIGOXIN 125 MCG PO TABS: 0.1250 mg | ORAL_TABLET | Freq: Every day | ORAL | Status: DC

## 2022-07-21 MED ORDER — EMPAGLIFLOZIN 10 MG PO TABS
10.0000 mg | ORAL_TABLET | Freq: Every day | ORAL | Status: DC
  Administered 2022-07-22 – 2022-07-28 (×7): 10 mg via ORAL
  Filled 2022-07-21 (×7): qty 1

## 2022-07-21 MED ORDER — ADULT MULTIVITAMIN W/MINERALS CH: 1.0000 | ORAL_TABLET | Freq: Every day | ORAL | Status: DC

## 2022-07-21 MED ORDER — ALUM & MAG HYDROXIDE-SIMETH 200-200-20 MG/5ML PO SUSP: 30.0000 mL | ORAL | Status: DC | PRN

## 2022-07-21 MED ORDER — METHOCARBAMOL 500 MG PO TABS: 500.0000 mg | ORAL_TABLET | Freq: Four times a day (QID) | ORAL | Status: DC | PRN

## 2022-07-21 MED ORDER — MAGNESIUM OXIDE -MG SUPPLEMENT 400 (240 MG) MG PO TABS
400.0000 mg | ORAL_TABLET | Freq: Two times a day (BID) | ORAL | Status: DC
  Administered 2022-07-21 – 2022-07-31 (×20): 400 mg via ORAL
  Filled 2022-07-21 (×20): qty 1

## 2022-07-21 MED ORDER — MELATONIN 3 MG PO TABS
3.0000 mg | ORAL_TABLET | Freq: Every day | ORAL | Status: DC
  Administered 2022-07-21 – 2022-07-30 (×10): 3 mg via ORAL
  Filled 2022-07-21 (×11): qty 1

## 2022-07-21 MED ORDER — PROCHLORPERAZINE MALEATE 5 MG PO TABS: 5.0000 mg | ORAL_TABLET | Freq: Four times a day (QID) | ORAL | Status: DC | PRN

## 2022-07-21 MED ORDER — INSULIN GLARGINE-YFGN 100 UNIT/ML ~~LOC~~ SOLN: 25.0000 [IU] | Freq: Every day | SUBCUTANEOUS | 11 refills | Status: DC

## 2022-07-21 MED ORDER — EMPAGLIFLOZIN 10 MG PO TABS
10.0000 mg | ORAL_TABLET | Freq: Every day | ORAL | Status: DC
  Administered 2022-07-21: 10 mg via ORAL
  Filled 2022-07-21: qty 1

## 2022-07-21 MED ORDER — ASPIRIN 81 MG PO CHEW: 81.0000 mg | CHEWABLE_TABLET | Freq: Every day | ORAL | Status: DC

## 2022-07-21 MED ORDER — WARFARIN - PHARMACIST DOSING INPATIENT: Freq: Every day | Status: DC

## 2022-07-21 MED ORDER — LIDOCAINE 5 % EX PTCH: 2.0000 | MEDICATED_PATCH | Freq: Every day | CUTANEOUS | Status: DC | PRN

## 2022-07-21 MED ORDER — INSULIN GLARGINE-YFGN 100 UNIT/ML ~~LOC~~ SOLN
22.0000 [IU] | Freq: Every day | SUBCUTANEOUS | Status: DC
  Administered 2022-07-21 – 2022-07-30 (×10): 22 [IU] via SUBCUTANEOUS
  Filled 2022-07-21 (×11): qty 0.22

## 2022-07-21 MED ORDER — FE FUM-VIT C-VIT B12-FA 460-60-0.01-1 MG PO CAPS: 1.0000 | ORAL_CAPSULE | Freq: Every day | ORAL | 0 refills | Status: DC

## 2022-07-21 MED ORDER — FLEET ENEMA 7-19 GM/118ML RE ENEM: 1.0000 | ENEMA | Freq: Once | RECTAL | Status: DC | PRN

## 2022-07-21 MED ORDER — ADULT MULTIVITAMIN W/MINERALS CH
1.0000 | ORAL_TABLET | Freq: Every day | ORAL | Status: DC
  Administered 2022-07-22 – 2022-07-31 (×10): 1 via ORAL
  Filled 2022-07-21 (×10): qty 1

## 2022-07-21 MED ORDER — WARFARIN SODIUM 3 MG PO TABS
3.0000 mg | ORAL_TABLET | Freq: Every day | ORAL | Status: DC
  Administered 2022-07-21: 3 mg via ORAL
  Filled 2022-07-21: qty 1

## 2022-07-21 MED ORDER — SPIRONOLACTONE 25 MG PO TABS
25.0000 mg | ORAL_TABLET | Freq: Every day | ORAL | Status: DC
  Administered 2022-07-22 – 2022-07-31 (×10): 25 mg via ORAL
  Filled 2022-07-21 (×10): qty 1

## 2022-07-21 MED ORDER — OXYCODONE HCL 5 MG PO TABS: 5.0000 mg | ORAL_TABLET | Freq: Three times a day (TID) | ORAL | Status: DC | PRN

## 2022-07-21 MED ORDER — ALBUTEROL SULFATE (2.5 MG/3ML) 0.083% IN NEBU: 2.5000 mg | INHALATION_SOLUTION | RESPIRATORY_TRACT | 12 refills | Status: DC | PRN

## 2022-07-21 MED ORDER — CHLORHEXIDINE GLUCONATE CLOTH 2 % EX PADS: 6.0000 | MEDICATED_PAD | Freq: Every day | CUTANEOUS | Status: DC

## 2022-07-21 MED ORDER — TRAZODONE HCL 50 MG PO TABS
25.0000 mg | ORAL_TABLET | Freq: Every evening | ORAL | Status: DC | PRN
  Administered 2022-07-27: 50 mg via ORAL
  Filled 2022-07-21 (×2): qty 1

## 2022-07-21 MED ORDER — GUAIFENESIN-DM 100-10 MG/5ML PO SYRP: 5.0000 mL | ORAL_SOLUTION | Freq: Four times a day (QID) | ORAL | Status: DC | PRN

## 2022-07-21 MED ORDER — DIGOXIN 125 MCG PO TABS
0.1250 mg | ORAL_TABLET | Freq: Every day | ORAL | Status: DC
  Administered 2022-07-22 – 2022-07-31 (×10): 0.125 mg via ORAL
  Filled 2022-07-21 (×10): qty 1

## 2022-07-21 MED ORDER — MELATONIN 3 MG PO TABS: 3.0000 mg | ORAL_TABLET | Freq: Every day | ORAL | 0 refills | Status: DC

## 2022-07-21 MED ORDER — DIPHENHYDRAMINE HCL 12.5 MG/5ML PO ELIX: 12.5000 mg | ORAL_SOLUTION | Freq: Four times a day (QID) | ORAL | Status: DC | PRN

## 2022-07-21 MED ORDER — LEVOTHYROXINE SODIUM 25 MCG PO TABS
25.0000 ug | ORAL_TABLET | Freq: Every day | ORAL | Status: DC
  Administered 2022-07-22 – 2022-07-31 (×10): 25 ug via ORAL
  Filled 2022-07-21 (×10): qty 1

## 2022-07-21 MED ORDER — INSULIN GLARGINE-YFGN 100 UNIT/ML ~~LOC~~ SOLN
25.0000 [IU] | Freq: Every day | SUBCUTANEOUS | Status: DC
  Administered 2022-07-22 – 2022-07-27 (×6): 25 [IU] via SUBCUTANEOUS
  Filled 2022-07-21 (×9): qty 0.25

## 2022-07-21 MED ORDER — INSULIN ASPART 100 UNIT/ML IJ SOLN: 5.0000 [IU] | Freq: Three times a day (TID) | INTRAMUSCULAR | 11 refills | Status: DC

## 2022-07-21 MED ORDER — PNEUMOCOCCAL 20-VAL CONJ VACC 0.5 ML IM SUSY: 0.5000 mL | PREFILLED_SYRINGE | INTRAMUSCULAR | 0 refills | Status: DC

## 2022-07-21 MED ORDER — SODIUM CHLORIDE 0.9% FLUSH
10.0000 mL | Freq: Two times a day (BID) | INTRAVENOUS | Status: DC
  Administered 2022-07-22: 20 mL
  Administered 2022-07-22 – 2022-07-23 (×2): 10 mL
  Administered 2022-07-23: 20 mL
  Administered 2022-07-24 – 2022-07-28 (×8): 10 mL
  Administered 2022-07-29: 20 mL
  Administered 2022-07-29 – 2022-07-30 (×2): 10 mL
  Administered 2022-07-30: 20 mL
  Administered 2022-07-31: 10 mL

## 2022-07-21 MED ORDER — SORBITOL 70 % SOLN: 30.0000 mL | Freq: Every day | Status: DC | PRN

## 2022-07-21 NOTE — TOC Transition Note (Signed)
Transition of Care [facility]) - CM/SW Discharge Note   Patient Details  Name: Darryl Frye MRN: [id number] Date of Birth: 10/23/1960  [facility]) CM/SW Contact:  Erenest Rasher, RN Phone Number: [phone]  07/21/2022, 3:03 PM   Clinical Narrative:     HF TOC CM notification that IP rehab can accept today . Wife at home to assist once he has completed CIR.   Final next level of care: IP Rehab Facility Barriers to Discharge: No Barriers Identified   Patient Goals and CMS Choice Patient states their goals for this hospitalization and ongoing recovery are:: wants to remain independent      Discharge Placement                       Discharge Plan and Services   Discharge Planning Services: CM Consult                                 Social Determinants of Health (SDOH) Interventions Food Insecurity Interventions: Intervention Not Indicated Housing Interventions: Intervention Not Indicated Transportation Interventions: Intervention Not Indicated Utilities Interventions: Intervention Not Indicated Alcohol Usage Interventions: Intervention Not Indicated (Score <7) Financial Strain Interventions: Intervention Not Indicated   Readmission Risk Interventions     No data to display

## 2022-07-21 NOTE — Progress Notes (Signed)
Inpatient Rehabilitation Admission Medication Review by a Pharmacist  A complete drug regimen review was completed for this patient to identify any potential clinically significant medication issues.  High Risk Drug Classes Is patient taking? Indication by Medication  Antipsychotic Yes Compazine - N/V  Anticoagulant Yes Coumadin - LVAD/DVT  Antibiotic No   Opioid Yes Oxycodone - pain  Antiplatelet Yes Asa - LVAD  Hypoglycemics/insulin Yes SSI/Semglee/Jardiance - DM  Vasoactive Medication Yes Revatio - PAH  Chemotherapy No   Other Yes Amiodarone/digoxin - AF Atorvastatin - HLD Albuterol - short of breath Jardiance -  CHF Synthroid - hypothyroidism Lidocaine patch - pain Mag-ox/MVI - supplementation Melatorin - sleep Robaxin - spasms Zoloft - depression Trazodone- sleep Aldactone/torsemide - CHF     Type of Medication Issue Identified Description of Issue Recommendation(s)  Drug Interaction(s) (clinically significant)     Duplicate Therapy     Allergy     No Medication Administration End Date     Incorrect Dose     Additional Drug Therapy Needed  Flu and PNA vaccine F/u with team in AM  Significant med changes from prior encounter (inform family/care partners about these prior to discharge).    Other  PRN hydralazine Resume as needed    Clinically significant medication issues were identified that warrant physician communication and completion of prescribed/recommended actions by midnight of the next day:  Yes  Name of provider notified for urgent issues identified: MD on weekend  Provider Method of Notification: Secure chat    Pharmacist comments:   Time spent performing this drug regimen review (minutes):  30

## 2022-07-21 NOTE — Telephone Encounter (Signed)
Advanced Heart Failure Patient Advocate Encounter  Prior Authorization for Sildenafil has been approved.    PA# [id number] Effective dates: 07/19/22 through 08/21/23  Patients co-pay is $0 (30 days)  Charlann Boxer, CPhT

## 2022-07-21 NOTE — Progress Notes (Signed)
Discussed with pt IS, sternal precautions, importance of walking, and CRPII. Pt receptive. Will refer to [place]. During our conversation, Sp02 down to 86 RA (confirmed on two pulse oximeters) therefore replaced 3L O2. SpO2 to 93.  [id number] Yves Dill BS, ACSM-CEP 07/21/2022 1:43 PM

## 2022-07-21 NOTE — Progress Notes (Signed)
CSW met with patient at bedside. Patient is hopeful to go to rehab soon and is motivated for recovery. Patient denies any concerns at this time. CSW continues to follow for supportive needs. Raquel Sarna, [place], [place]

## 2022-07-21 NOTE — Progress Notes (Signed)
PMR Admission Coordinator Pre-Admission Assessment   Patient: Darryl Frye is an 61 y.o., male MRN: [id number] DOB: 16-May-1961 Height: _0  (180.3 cm) Weight: 93.1 kg   Insurance Information HMO: yes    PPO:      PCP:      IPA:      80/20:      OTHER:  PRIMARY: [employer] medicare      Policy#: [id number] Group [id number]      Subscriber: Patient CM Name:       Phone#: [phone]     Fax#: [phone] Pre-Cert#: [id number]  approval received from [place] approved for 07/17/22-07/24/22     Benefits:  Phone #: [email or web address]      Eff. Date: 03/21/2022-08/20/2022     Deduct: does not have      Out of Pocket Max: $8,300 ($409.79 met)       CIR: $1,556/admission co-pay      SNF: $0 copay days 1-20, $200 copay days 21-100, max of 100 days/benefit period Outpatient: 80% coverage, 20% coinsurance     Home Health: 100% coverage, limited by medical nec.      DME: 80% coverage, 20% coinsurance      Providers: patient choice.  SECONDARY: Medicaid [place] [facility]      Policy#: [id number]     Phone#: [phone]     The "Data Collection Information Summary" for patients in Inpatient Rehabilitation Facilities with attached "Privacy Act [place] Records" was provided and verbally reviewed with: Patient and Family   Emergency Contact Information Contact Information       Name Relation Home Work Mobile    [place] Friend [phone]   [phone]    Davi, Kroon Daughter     [phone]           Current Medical History  Patient Admitting Diagnosis: Cardiogenic Shock History of Present Illness: Patient is a 61 year old male admitted to [facility] 06/27/22 with non-ischemic Cardiomyopathy and is s/p LVAD insertion 07/04/22. Cath in 12/20 with no significant coronary disease.  Medtronic ICD. Most recent echo in 2/23 showed EF 20-25% with normal RV.  Recently, patient has been symptomatically worse, NYHA class IV with profound orthopnea. RHC showed markedly elevated filling  pressures, primarily pulmonary venous hypertension, low cardiac output, and low PAPI. Echo this admission with EF <20%, mild RV dysfunction.  He was started on milrinone with improvement in hemodynamics. Patient extubated 07/06/22. Developed RVR on 07/07/22, NSR on 07/08/22.  Epistaxis: s/p cauterization/packing 11/22, packing now removed.  Anemia: Hgb 7.9 => 7.8 => 7.6=>1uRBCs=>8.3=>8.2. Hyponatremia: Na 133. Fluid restrict. Therapies recommending intensive rehab program.    Patient's medical record from [facility] has been reviewed by the rehabilitation admission coordinator and physician.   Past Medical History      Past Medical History:  Diagnosis Date   Acute kidney injury ([place])     Acute respiratory failure (HCC)     Atrial flutter (HCC)      on Eliquis   CHF (congestive heart failure) ([place])      EF 20-25% 10/15/21, AICD in place.   Coronary artery disease     Diabetes mellitus without complication (HCC)     DVT (deep venous thrombosis) ([place]) 06/2019   Heart murmur     Hypertension     Presence of permanent cardiac pacemaker     Renal disorder        Has the patient had major surgery during 100 days prior to admission? Yes   Family History  family history includes Diabetes in his mother; Heart disease in his maternal grandmother and mother; Hyperlipidemia in his mother; Hypertension in his mother; Prostate cancer in his maternal grandfather.   Current Medications   Current Facility-Administered Medications:    0.45 % sodium chloride infusion, , Intravenous, Continuous PRN, Enter, Pierre Bali, MD, Stopped at 07/07/22 1346   0.9 %  sodium chloride infusion (Manually program via Guardrails IV Fluids), , Intravenous, Once, Leonor Liv, CRNA   0.9 %  sodium chloride infusion, 250 mL, Intravenous, Continuous, Enter, Pierre Bali, MD   0.9 %  sodium chloride infusion, , Intravenous, Continuous, Enter, Pierre Bali, MD   acetaminophen (TYLENOL) tablet 650 mg, 650 mg, Oral, Q4H PRN, Larey Dresser, MD   ALPRAZolam Duanne Moron) tablet 0.25 mg, 0.25 mg, Oral, TID PRN, Enter, Pierre Bali, MD, 0.25 mg at 07/07/22 2037   amiodarone (NEXTERONE PREMIX) 360-4.14 MG/200ML-% (1.8 mg/mL) IV infusion, 30 mg/hr, Intravenous, Continuous, Bensimhon, Shaune Pascal, MD, Last Rate: 16.67 mL/hr at 07/10/22 1300, 30 mg/hr at 07/10/22 1300   aspirin chewable tablet 81 mg, 81 mg, Oral, Daily, Enter, Pierre Bali, MD, 81 mg at 07/10/22 0913   atorvastatin (LIPITOR) tablet 20 mg, 20 mg, Oral, Daily, Larey Dresser, MD, 20 mg at 07/10/22 0911   bisacodyl (DULCOLAX) EC tablet 10 mg, 10 mg, Oral, Daily, 10 mg at 07/10/22 0913 **OR** bisacodyl (DULCOLAX) suppository 10 mg, 10 mg, Rectal, Daily, Enter, Pierre Bali, MD   ceFEPIme (MAXIPIME) 2 g in sodium chloride 0.9 % 100 mL IVPB, 2 g, Intravenous, Q8H, Elsie Amis, RPH, Last Rate: 200 mL/hr at 07/10/22 1352, 2 g at 07/10/22 1352   Chlorhexidine Gluconate Cloth 2 % PADS 6 each, 6 each, Topical, Daily, Larey Dresser, MD, 6 each at 07/09/22 1400   dextrose 50 % solution 0-50 mL, 0-50 mL, Intravenous, PRN, Enter, Pierre Bali, MD   digoxin Fonnie Birkenhead) tablet 0.125 mg, 0.125 mg, Oral, Daily, Larey Dresser, MD, 0.125 mg at 07/10/22 0913   feeding supplement (ENSURE ENLIVE / ENSURE PLUS) liquid 237 mL, 237 mL, Oral, TID BM, Enter, Pierre Bali, MD, 237 mL at 07/10/22 1346   furosemide (LASIX) 200 mg in dextrose 5 % 100 mL (2 mg/mL) infusion, 10 mg/hr, Intravenous, Continuous, Bensimhon, Shaune Pascal, MD, Last Rate: 5 mL/hr at 07/10/22 1300, 10 mg/hr at 07/10/22 1300   hydrALAZINE (APRESOLINE) injection 20 mg, 20 mg, Intravenous, Q4H PRN, Larey Dresser, MD, 20 mg at 07/06/22 2212   CBG monitoring, , , Q4H **AND** insulin aspart (novoLOG) injection 0-24 Units, 0-24 Units, Subcutaneous, Q4H, Enter, Pierre Bali, MD, 12 Units at 07/10/22 1200   insulin glargine-yfgn (SEMGLEE) injection 35 Units, 35 Units, Subcutaneous, Daily, Icard, Bradley L, DO, 35 Units at 07/10/22 1054    ipratropium-albuterol (DUONEB) 0.5-2.5 (3) MG/3ML nebulizer solution 3 mL, 3 mL, Nebulization, Q6H, Larey Dresser, MD, 3 mL at 07/10/22 0835   lactated ringers infusion 500 mL, 500 mL, Intravenous, Once PRN, Enter, Pierre Bali, MD   lactated ringers infusion, , Intravenous, Continuous, Enter, Pierre Bali, MD, Stopped at 07/04/22 2207   lactated ringers infusion, , Intravenous, Continuous, Enter, Pierre Bali, MD, Last Rate: 20 mL/hr at 07/10/22 1300, Infusion Verify at 07/10/22 1300   levothyroxine (SYNTHROID) tablet 25 mcg, 25 mcg, Oral, Q0600, Larey Dresser, MD, 25 mcg at 07/10/22 0618   lidocaine (LIDODERM) 5 % 2 patch, 2 patch, Transdermal, Q24H, Kipp Brood, MD, 2 patch at 07/09/22 1823   magnesium oxide (MAG-OX) tablet  400 mg, 400 mg, Oral, BID, Larey Dresser, MD, 400 mg at 07/10/22 0912   melatonin tablet 3 mg, 3 mg, Oral, QHS, Coralie Common, MD, 3 mg at 07/09/22 2108   milrinone (PRIMACOR) 20 MG/100 ML (0.2 mg/mL) infusion, 0.375 mcg/kg/min, Intravenous, Continuous, Bensimhon, Shaune Pascal, MD, Last Rate: 10.11 mL/hr at 07/10/22 1300, 0.375 mcg/kg/min at 07/10/22 1300   morphine (PF) 2 MG/ML injection 1-4 mg, 1-4 mg, Intravenous, Q1H PRN, Enter, Pierre Bali, MD, 2 mg at 07/06/22 0636   multivitamin with minerals tablet 1 tablet, 1 tablet, Oral, Daily, Larey Dresser, MD, 1 tablet at 07/10/22 0913   norepinephrine (LEVOPHED) 68m in 2524m(0.016 mg/mL) premix infusion, 0-12 mcg/min, Intravenous, Titrated, Enter, DaPierre BaliMD, Stopped at 07/06/22 0221   ondansetron (ZOFRAN) injection 4 mg, 4 mg, Intravenous, Q6H PRN, McLarey DresserMD   ondansetron ([facility] 4 mg, 4 mg, Intravenous, Q6H PRN, Enter, DaPierre BaliMD   Oral care mouth rinse, 15 mL, Mouth Rinse, PRN, Enter, DaPierre BaliMD   oxyCODONE (Oxy IR/ROXICODONE) immediate release tablet 5-10 mg, 5-10 mg, Oral, Q3H PRN, Enter, DaPierre BaliMD, 5 mg at 07/10/22 0048   pantoprazole (PROTONIX) EC tablet 40 mg, 40 mg, Oral, Daily, Enter, DaPierre BaliMD, 40 mg at 07/10/22 0912   polyethylene glycol (MIRALAX / GLYCOLAX) packet 17 g, 17 g, Oral, BID, Clegg, Amy D, NP, 17 g at 07/10/22 0914   potassium chloride SA (KLOR-CON M) CR tablet 20 mEq, 20 mEq, Oral, Q4H, Enter, DaPierre BaliMD, 20 mEq at 07/10/22 1300   senna-docusate (Senokot-S) tablet 1 tablet, 1 tablet, Oral, BID, Clegg, Amy D, NP, 1 tablet at 07/10/22 0913   sertraline (ZOLOFT) tablet 50 mg, 50 mg, Oral, Daily, McLarey DresserMD, 50 mg at 07/10/22 091062 sildenafil (REVATIO) tablet 20 mg, 20 mg, Oral, TID, Clegg, Amy D, NP, 20 mg at 07/10/22 0912   sodium chloride flush (NS) 0.9 % injection 10-40 mL, 10-40 mL, Intracatheter, Q12H, McLarey DresserMD, 10 mL at 07/10/22 1057   sodium chloride flush (NS) 0.9 % injection 10-40 mL, 10-40 mL, Intracatheter, PRN, McLarey DresserMD   sodium chloride flush (NS) 0.9 % injection 10-40 mL, 10-40 mL, Intracatheter, Q12H, Enter, DaPierre BaliMD, 10 mL at 07/10/22 1057   sodium chloride flush (NS) 0.9 % injection 10-40 mL, 10-40 mL, Intracatheter, PRN, Enter, DaPierre BaliMD   sodium chloride flush (NS) 0.9 % injection 3 mL, 3 mL, Intravenous, Q12H, McLarey DresserMD, 3 mL at 07/10/22 1057   sodium chloride flush (NS) 0.9 % injection 3 mL, 3 mL, Intravenous, Q12H, Enter, DaPierre BaliMD, 3 mL at 07/10/22 1057   sodium chloride flush (NS) 0.9 % injection 3 mL, 3 mL, Intravenous, PRN, Enter, DaPierre BaliMD   sodium phosphate 30 mmol in dextrose 5 % 250 mL infusion, 30 mmol, Intravenous, Once, McLarey DresserMD, Last Rate: 43 mL/hr at 07/10/22 1300, Infusion Verify at 07/10/22 1300   spironolactone (ALDACTONE) tablet 25 mg, 25 mg, Oral, Daily, Bensimhon, DaShaune PascalMD, 25 mg at 07/10/22 0912   traMADol (ULTRAM) tablet 50-100 mg, 50-100 mg, Oral, Q4H PRN, Enter, DaPierre BaliMD, 100 mg at 07/06/22 1649   vancomycin (VANCOCIN) IVPB 1000 mg/200 mL premix, 1,000 mg, Intravenous, Q12H, JaElsie AmisRPH, Stopped at 07/10/22 1151   Warfarin - Physician  Dosing Inpatient, , Does not apply, q1600, Enter, DaPierre BaliMD, Given at 07/09/22 1557  Patients Current Diet:  Diet Order                  Diet 2 gram sodium Room service appropriate? Yes; Fluid consistency: Thin; Fluid restriction: 1800 mL Fluid  Diet effective now                         Precautions / Restrictions Precautions Precautions: Fall, Sternal Precaution Booklet Issued: No Precaution Comments: LVAD, Chest tube, HHFNC Restrictions Weight Bearing Restrictions: No RUE Weight Bearing: Non weight bearing LUE Weight Bearing: Non weight bearing Other Position/Activity Restrictions: UE sternal precautions    Has the patient had 2 or more falls or a fall with injury in the past year? No   Prior Activity Level   Prior Functional Level Self Care: Did the patient need help bathing, dressing, using the toilet or eating? Independent   Indoor Mobility: Did the patient need assistance with walking from room to room (with or without device)? Independent   Stairs: Did the patient need assistance with internal or external stairs (with or without device)? Independent   Functional Cognition: Did the patient need help planning regular tasks such as shopping or remembering to take medications? Independent   Patient Information Are you of Hispanic, Latino/a,or Spanish origin?: A. No, not of Hispanic, Latino/a, or Spanish origin What is your race?: B. Black or African American Do you need or want an interpreter to communicate with a doctor or health care staff?: 0. No   Patient's Response To:  Health Literacy and Transportation Is the patient able to respond to health literacy and transportation needs?: Yes Health Literacy - How often do you need to have someone help you when you read instructions, pamphlets, or other written material from your doctor or pharmacy?: Never In the past 12 months, has lack of transportation kept you from medical appointments or from getting  medications?: No In the past 12 months, has lack of transportation kept you from meetings, work, or from getting things needed for daily living?: No   Home Assistive Devices / Equipment Home Equipment: Cane - quad   Prior Device Use: Indicate devices/aids used by the patient prior to current illness, exacerbation or injury?  Occasional use of cane   Current Functional Level Cognition   Overall Cognitive Status: Within Functional Limits for tasks assessed Orientation Level: Oriented X4 General Comments: very motivated    Extremity Assessment (includes Sensation/Coordination)   Upper Extremity Assessment: Generalized weakness (difficult to fully assess due to sternal precautions)  Lower Extremity Assessment: Defer to PT evaluation     ADLs   Overall ADL's : Needs assistance/impaired Eating/Feeding: Set up, Bed level Grooming: Wash/dry hands, Wash/dry face, Set up, Bed level Upper Body Bathing: Minimal assistance, Bed level Lower Body Bathing: Maximal assistance, Bed level Upper Body Dressing : Moderate assistance Upper Body Dressing Details (indicate cue type and reason): for sternal precautions Lower Body Dressing: Maximal assistance, Bed level Toilet Transfer: Minimal assistance, +2 for physical assistance, +2 for safety/equipment, Ambulation Toilet Transfer Details (indicate cue type and reason): eva walker. simulated. Toileting- Clothing Manipulation and Hygiene: Maximal assistance, Sit to/from stand Functional mobility during ADLs: Minimal assistance, +2 for physical assistance, +2 for safety/equipment General ADL Comments: cues throughout to maintain sternal precautions. +2 used for line management, LVAD management and chair follow.     Mobility   Overal bed mobility: Needs Assistance Bed Mobility: Sit to Supine Supine to sit: +2 for physical assistance, Mod assist Sit  to supine: +2 for physical assistance, Mod assist General bed mobility comments: Received OOB in chair.  Assist for BLE's back into bed. Increased time     Transfers   Overall transfer level: Needs assistance Equipment used: None Transfers: Sit to/from Stand Sit to Stand: Min assist, +2 physical assistance, Mod assist General transfer comment: Min-modA + 2 to rise from recliner, pt holding onto sternal pillow, rocking to gain momentum.     Ambulation / Gait / Stairs / Wheelchair Mobility   Ambulation/Gait Ambulation/Gait assistance: Min assist, +2 physical assistance (+3 safety/equipment, chair follow) Gait Distance (Feet): 50 Feet (25", 25") Assistive device: Ethelene Hal Gait Pattern/deviations: Step-through pattern, Decreased stride length General Gait Details: Verbal cueing for upright posture, glute/quad activation, and scapular retraction. Pt with increased bilateral knee instability with distance. Needs cues for activity pacing and when to rest Gait velocity: decreased Gait velocity interpretation: <1.8 ft/sec, indicate of risk for recurrent falls     Posture / Balance Balance Overall balance assessment: Needs assistance Sitting-balance support: Feet supported Sitting balance-Leahy Scale: Fair Postural control: Posterior lean Standing balance support: Bilateral upper extremity supported Standing balance-Leahy Scale: Poor Standing balance comment: reliant on external support     Special needs/care consideration Oxygen on room air, Skin intact, and Special service needs TVAR    Previous Home Environment (from acute therapy documentation) Living Arrangements: Alone Available Help at Discharge: Family, Friend(s), Available 24 hours/day Type of Home: Apartment Home Layout: One level Home Access: Elevator Bathroom Shower/Tub: Chiropodist: Standard Bathroom Accessibility: Yes How Accessible: Accessible via walker [place]ley: No   Discharge Living Setting Plans for Discharge Living Setting: Patient's home, Alone, Apartment Type of Home at Discharge:  Apartment Discharge Home Layout: One level Discharge Home Access: Elevator, Level entry Discharge Bathroom Shower/Tub: Tub/shower unit Discharge Bathroom Toilet: Standard Discharge Bathroom Accessibility: Yes How Accessible: Accessible via walker Does the patient have any problems obtaining your medications?: No   Social/Family/Support Systems Anticipated Caregiver: Wife Anticipated Caregiver's Contact Information: [phone] Ability/Limitations of Caregiver: not available 24/7, but friend, Fayette Pho can help as well. Caregiver Availability: 24/7 Discharge Plan Discussed with Primary Caregiver: Yes Is Caregiver In Agreement with Plan?: Yes Does Caregiver/Family have Issues with Lodging/Transportation while Pt is in Rehab?: No   Goals Patient/Family Goal for Rehab: Mod I PT/OT Expected length of stay: 7-10 days Pt/Family Agrees to Admission and willing to participate: Yes Program Orientation Provided & Reviewed with Pt/Caregiver Including Roles  & Responsibilities: Yes  Barriers to Discharge: Insurance for SNF coverage   Decrease burden of Care through IP rehab admission: Othern/a   Possible need for SNF placement upon discharge: not anticpated   Patient Condition: I have reviewed medical records from [facility], spoken with  TOC , and patient, spouse, and daughter. I met with patient at the bedside and discussed via phone for inpatient rehabilitation assessment.  Patient will benefit from ongoing PT and OT, can actively participate in 3 hours of therapy a day 5 days of the week, and can make measurable gains during the admission.  Patient will also benefit from the coordinated team approach during an Inpatient Acute Rehabilitation admission.  The patient will receive intensive therapy as well as Rehabilitation physician, nursing, social worker, and care management interventions.  Due to safety, skin/wound care, disease management, medication administration, pain management, and  patient education the patient requires 24 hour a day rehabilitation nursing.  The patient is currently Min A with mobility and basic ADLs.  Discharge  setting and therapy post discharge at home with home health is anticipated.  Patient has agreed to participate in the Acute Inpatient Rehabilitation Program and will admit 07/21/22.   Preadmission Screen Completed By:  Nelly Laurence, 07/10/2022 2:03 PM ______________________________________________________________________   Discussed status with Dr. Naaman Plummer on 07/21/22 at 9:30 am and received approval for admission today.   Admission Coordinator:  Nelly Laurence, time 9:57 am/Date 07/21/22    Assessment/Plan: Diagnosis: Cardiac debility Does the need for close, 24 hr/day Medical supervision in concert with the patient's rehab needs make it unreasonable for this patient to be served in a less intensive setting? Yes Co-Morbidities requiring supervision/potential complications: overweight, cardiogenic shock, atrial fibrillation with RVR, acute pulmonary edema, aspiration pneumonia of both lower lobes Due to bladder management, bowel management, safety, skin/wound care, disease management, medication administration, pain management, and patient education, does the patient require 24 hr/day rehab nursing? Yes Does the patient require coordinated care of a physician, rehab nurse, PT, OT, and SLP to address physical and functional deficits in the context of the above medical diagnosis(es)? Yes Addressing deficits in the following areas: balance, endurance, locomotion, strength, transferring, bowel/bladder control, bathing, dressing, feeding, grooming, toileting, and psychosocial support Can the patient actively participate in an intensive therapy program of at least 3 hrs of therapy 5 days a week? Yes The potential for patient to make measurable gains while on inpatient rehab is excellent Anticipated functional outcomes upon discharge from inpatient  rehab: modified independent PT, modified independent OT, independent SLP Estimated rehab length of stay to reach the above functional goals is: 10-14 days Anticipated discharge destination: Home 10. Overall Rehab/Functional Prognosis: excellent     MD Signature: Leeroy Cha, MD

## 2022-07-21 NOTE — Progress Notes (Addendum)
Patient ID: Darryl Frye, male   DOB: 10/05/1960, 61 y.o.   MRN: [id number]  Advanced Heart Failure VAD Team Note  PCP-Cardiologist: Carlyle Dolly, MD   Subjective:    - 11/13 S/P HMIII L atrial appendage closure.  - 11/16 Extubated . GIven 40 mg IV and started on lasix drip 10 mg per/hr.  - 11/17 Developed A fib RVR. Given bolus 150 x 2. Unchanged.  - 11/18 Bedside echo 11/18 LV small. VAD cannula well aligned. AoV opening every beat. Septum pulling to left RV moderately HK. Small to moderate anterior effusion - 11/22 Ramp echo - 11/22 Nasal cautery and packing by ENT. Ramp echo. Speed increased to 5400.  CT chest suggestive of patchy multifocal PNA and atelectasis.  11/30 ICD turned back on  Off milrinone, CO-OX 50?%.  Good diuresis yesterday, -3.4L UOP. CVP 9/10.  On room air.   Scr 1.11>1.4>1.18.    Feels good this morning, no pain.   LVAD INTERROGATION:  HeartMate III LVAD:   Flow 4.7 liters/min, speed 5400, power 4 PI 3.1. x2 PI events LDH stable 271 => 258 => 243=>245 => 228 =>227=>247 INR 1.9  Objective:    Vital Signs:   Temp:  [97.7 F (36.5 C)-98.7 F (37.1 C)] 97.7 F (36.5 C) (12/01 0255) Pulse Rate:  [81-90] 81 (12/01 0304) Resp:  [16-24] 16 (12/01 0304) BP: (90-117)/(77-87) 93/81 (12/01 0255) SpO2:  [96 %-100 %] 96 % (12/01 0304) Weight:  [86.4 kg] 86.4 kg (12/01 0255) Last BM Date : 07/19/22 Mean arterial Pressure 80s-90s  Intake/Output:   Intake/Output Summary (Last 24 hours) at 07/21/2022 0707 Last data filed at 07/21/2022 0644 Gross per 24 hour  Intake 1747 ml  Output 3375 ml  Net -1628 ml     Physical Exam  CVP 9/10 General:  Well appearing. No resp difficulty HEENT: Normal Neck: supple. JVP ~12. Carotids 2+ bilat; no bruits. No lymphadenopathy or thyromegaly appreciated. Cor: Mechanical heart sounds with LVAD hum present. Lungs: Clear Abdomen: soft, nontender, nondistended. No hepatosplenomegaly. No bruits or masses. Good bowel  sounds. Driveline: C/D/I; securement device intact and driveline incorporated Extremities: no cyanosis, clubbing, rash, edema Neuro: alert & orientedx3, cranial nerves grossly intact. moves all 4 extremities w/o difficulty. Affect pleasant  Telemetry   NSR 80s (Personally reviewed)    Labs   Basic Metabolic Panel: Recent Labs  Lab 07/15/22 0430 07/16/22 0555 07/17/22 0445 07/18/22 0531 07/19/22 0413 07/20/22 0345 07/21/22 0310  NA 130*   < > 128* 128* 129* 133* 134*  K 3.8   < > 4.4 4.2 3.6 4.1 4.3  CL 88*   < > 91* 91* 91* 92* 90*  CO2 27   < > 24 26 27 28 27   GLUCOSE 174*   < > 164* 138* 79 140* 138*  BUN 25*   < > 25* 29* 26* 27* 33*  CREATININE 1.40*   < > 1.34* 1.28* 1.11 1.40* 1.18  CALCIUM 8.7*   < > 8.6* 8.7* 8.4* 9.2 9.8  MG 2.4  --   --   --   --  2.3  --    < > = values in this interval not displayed.    Liver Function Tests: No results for input(s): "AST", "ALT", "ALKPHOS", "BILITOT", "PROT", "ALBUMIN" in the last 168 hours. No results for input(s): "LIPASE", "AMYLASE" in the last 168 hours. No results for input(s): "AMMONIA" in the last 168 hours.  CBC: Recent Labs  Lab 07/16/22 0555 07/17/22 0445 07/18/22  [id number] 07/19/22 0413 07/21/22 0310  WBC 15.1* 13.2* 11.7* 9.6 10.7*  HGB 7.8* 7.6* 8.3* 8.2* 9.1*  HCT 24.6* 23.1* 26.0* 25.6* 28.5*  MCV 85.7 84.6 85.8 86.2 85.3  PLT 534* 551* 595* 568* 629*    INR: Recent Labs  Lab 07/17/22 0445 07/18/22 0531 07/19/22 0413 07/20/22 0345 07/21/22 0310  INR 2.2* 1.9* 1.9* 1.7* 1.9*  :    Imaging   No results found.   Medications:     Scheduled Medications:  sodium chloride   Intravenous Once   amiodarone  200 mg Oral BID   aspirin  81 mg Oral Daily   atorvastatin  20 mg Oral Daily   bisacodyl  10 mg Oral Daily   Or   bisacodyl  10 mg Rectal Daily   Chlorhexidine Gluconate Cloth  6 each Topical Daily   digoxin  0.125 mg Oral Daily   Fe Fum-Vit C-Vit B12-FA  1 capsule Oral QPC breakfast    feeding supplement  237 mL Oral TID BM   influenza vac split quadrivalent PF  0.5 mL Intramuscular Tomorrow-1000   insulin aspart  0-15 Units Subcutaneous TID WC   insulin aspart  0-5 Units Subcutaneous QHS   insulin aspart  5 Units Subcutaneous TID WC   insulin glargine-yfgn  22 Units Subcutaneous QHS   insulin glargine-yfgn  25 Units Subcutaneous Daily   levothyroxine  25 mcg Oral Q0600   lidocaine  2 patch Transdermal Q24H   magnesium oxide  400 mg Oral BID   melatonin  3 mg Oral QHS   multivitamin with minerals  1 tablet Oral Daily   pantoprazole  40 mg Oral Daily   pneumococcal 20-valent conjugate vaccine  0.5 mL Intramuscular Tomorrow-1000   polyethylene glycol  17 g Oral BID   senna-docusate  1 tablet Oral BID   sertraline  50 mg Oral Daily   sildenafil  20 mg Oral TID   sodium chloride flush  10-40 mL Intracatheter Q12H   sodium chloride flush  10-40 mL Intracatheter Q12H   sodium chloride flush  3 mL Intravenous Q12H   sodium chloride flush  3 mL Intravenous Q12H   spironolactone  25 mg Oral Daily   Warfarin - Pharmacist Dosing Inpatient   Does not apply q1600    Infusions:  sodium chloride Stopped (07/07/22 1346)   sodium chloride     sodium chloride Stopped (07/19/22 1311)   lactated ringers     lactated ringers Stopped (07/04/22 2207)   lactated ringers Stopped (07/14/22 0801)    PRN Medications: sodium chloride, acetaminophen, albuterol, ALPRAZolam, dextrose, hydrALAZINE, lactated ringers, morphine injection, ondansetron (ZOFRAN) IV, ondansetron (ZOFRAN) IV, mouth rinse, oxyCODONE, sodium chloride flush, sodium chloride flush, sodium chloride flush, traMADol, tranexamic acid   Patient Profile   Admitted with A/C HFrEF --> cardiogenic shock. S/P HMIII LVAD  Assessment/Plan:    1. Acute on chronic systolic CHF/cardiogenic shock: Nonischemic cardiomyopathy, diagnosed 2020.  At the time, he drank heavily and used cocaine, so it is possible that this is a  substance abuse-related cardiomyopathy though LV function has remained low even with stopping ETOH and cocaine (denies use x several years).  Cath in 12/20 with no significant coronary disease.  Medtronic ICD. Most recent echo in 2/23 showed EF 20-25% with normal RV.  Recently, patient has been symptomatically worse, NYHA class IV with profound orthopnea. RHC showed markedly elevated filling pressures, primarily pulmonary venous hypertension, low cardiac output, and low PAPI.  Patient was admitted for inotrope  and diuresis. Echo this admission with EF <20%, mod RV dysfunction.  He was started on milrinone with improvement in hemodynamics.  We proceeded with HM3 LVAD with LAA clip on 11/14.  He is now off milrinone and remains on sildenafil 20 tid.  Co-ox 50%?, will recheck. CVP 9/10. Now on 2L [place]. Good flows on LVAD.  - Great diuresis yesterday with 80 IV lasix. CVP lowered. Will transition to torsemide 40 mg bid.  - Continue sildenafil 20 tid.  - Continue 25 mg spiro daily.  - Continue digoxin 0.125 daily, level ok today.  - Continue Jardiance 10 mg daily 2. A fib RVR, post-op: H/O S/p flutter ablation.  Remains in NSR.  - Continue po amiodarone 200 BID - Continue warfarin.  3. VAD management: s/p HM-3 VAD on 11/14.  Bedside echo 11/18 LV small, VAD cannula well aligned, AoV opening every beat, septum pulling to left RV moderately HK, small to moderate anterior effusion. Ramp echo 11/22 RV mild to mod HK. Speed turned to 5400. LDH 228. MAP 70s-80s.  - Now on warfarin with INR 1.9 - Continue ASA 81. - VAD interrogated personally. Parameters stable. 4. H/o DVT: On anticoagulation.  5. Acute hypoxemic respiratory failure, post-op. 11/16 extubated.  Bilateral lower lobe infiltrates on CXR. CVP trending down but CXR has not improved, still with bilateral infiltrates and significant oxygen requirement.  He has been on amiodarone this admission for atrial fibrillation, was not on amiodarone before this  hospitalization.  Will need to think about amiodarone lung toxicity, discussed with Dr Tacy Learn who does not think particularly likely but will need to monitor for improvement.  It is also possible that he aspirated.  Sputum culture + for capnocytophaga. CCM switched to zosyn given persistent infiltrates on CXR.CT chest w/o contrast. CT chest 11/22 with diffuse interstitial and patchy alveolar opacities. He continues to have oxygen requirement but coming down, 2L [place] today.  - Encourage hourly incentive spirometry. - Encourage mobility.  - Completed 1 week of Zosyn.  6. AKI: Creatinine 1.1>1.4>1.18 today. - Diuretic management as above - will remove foley, day 16 today. Monitor I&O 7. ID: Afebrile, WBCs 13. He completed initial vanc/cefepime empiric abx for ?post-op PNA. 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 CCM switched to zosyn given persistent infiltrates on imaging - He completed course of Zosyn.  8. Hypothyroidism - On levothyroxine.  9. Hypokalemia - Continue spironolactone.  10. Epistaxis: s/p cauterization/packing 11/22, packing now removed.  11. Anemia: Hgb 7.9 => 7.8 => 7.6=>1uRBCs=>8.3=>8.2=>9.1.  12. Hyponatremia: Na 133. Fluid restrict.  13. Deconditioning - PT/OT following  - CIR eventually.   [name] AGACNP-BC  07/21/2022 7:07 AM  Patient seen with NP, agree with the above note.   He is now on room air. Diuresed well again yesterday, weight is below pre-op. CVP 10-12 on my read today.   No complaints, getting stronger.   General: Well appearing this am. NAD.  HEENT: Normal. Neck: Supple, JVP 10-12 cm. Carotids OK.  Cardiac:  Mechanical heart sounds with LVAD hum present.  Lungs:  CTAB, normal effort.  Abdomen:  NT, ND, no HSM. No bruits or masses. +BS  LVAD exit site: Well-healed and incorporated. Dressing dry and intact. No erythema or drainage. Stabilization device present and accurately applied. Driveline dressing changed daily per sterile  technique. Extremities:  Warm and dry. No cyanosis, clubbing, rash, or edema.  Neuro:  Alert & oriented x 3. Cranial nerves grossly intact. Moves all 4 extremities w/o difficulty. Affect  pleasant    Stable on room air.  Will transition diuretic to torsemide 40 mg po bid.   LVAD parameters reviewed and stable.   Continue to mobilize, think he can go to CIR today if there is a bed. We will continue to follow closely there.   Loralie Champagne 07/21/2022 8:35 AM

## 2022-07-21 NOTE — Progress Notes (Signed)
Physical Therapy Treatment Patient Details Name: Darryl Frye MRN: [id number] DOB: January 26, 1961 Today's Date: 07/21/2022   History of Present Illness Pt is a 61 y.o. male presenting 06/27/22 with CHF for RHC; admitted for inotrope and diuresis.  Other PMH includes NICM s/p ICD, aflutter, CAD, DM, DVT, HTN, heart murmur, renal disorder.  S/p LVAD 11/14.    PT Comments    Pt is making good, gradual progress with mobility, ambulating an increased distance of ~80 ft during one bout today. However, he displays quads weakness, resulting in knee instability and his knees almost buckling as pt fatigued. Performed x10 sit <> stand reps end of session with focus on eccentric control to address this. Will continue to follow acutely. Current recommendations remain appropriate.   Recommendations for follow up therapy are one component of a multi-disciplinary discharge planning process, led by the attending physician.  Recommendations may be updated based on patient status, additional functional criteria and insurance authorization.  Follow Up Recommendations  Acute inpatient rehab (3hours/day)     Assistance Recommended at Discharge Frequent or constant Supervision/Assistance  Patient can return home with the following A little help with walking and/or transfers;A little help with bathing/dressing/bathroom;Assistance with cooking/housework;Assist for transportation;Help with stairs or ramp for entrance   Equipment Recommendations  BSC/3in1;Rollator (4 wheels)    Recommendations for Other Services       Precautions / Restrictions Precautions Precautions: Fall;Sternal Precaution Booklet Issued: No Precaution Comments: LVAD, watch SpO2 Restrictions Weight Bearing Restrictions: Yes (sternal precautions)     Mobility  Bed Mobility               General bed mobility comments: Pt sitting EOB upon arrival.    Transfers Overall transfer level: Needs assistance Equipment used: None,  Rollator (4 wheels) Transfers: Sit to/from Stand Sit to Stand: Min guard, Min assist           General transfer comment: Min guard assist for safety and occasional assistance to lock rollator brakes before sitting in it, slow to rise needing cues for eccentric control to sit.    Ambulation/Gait Ambulation/Gait assistance: Min guard Gait Distance (Feet): 80 Feet (x3 bouts of ~76 ft > ~54 ft > ~80 ft) Assistive device: Rollator (4 wheels) Gait Pattern/deviations: Step-through pattern, Decreased stride length (knees coming close to buckling) Gait velocity: decreased Gait velocity interpretation: <1.31 ft/sec, indicative of household ambulator   General Gait Details: Pt with slow gait and noted knee instability that increased as distance progressed, almost buckling a couple times but did not fully buckle. Pt needing close min guard assist for safety. x2 several min duration seated rest breaks between bouts of gait   Stairs             Wheelchair Mobility    Modified Rankin (Stroke Patients Only)       Balance Overall balance assessment: Needs assistance Sitting-balance support: Feet supported Sitting balance-Leahy Scale: Good     Standing balance support: No upper extremity supported, During functional activity Standing balance-Leahy Scale: Fair Standing balance comment: reliant on external support for gait, but can stand statically without UE support                            Cognition Arousal/Alertness: Awake/alert Behavior During Therapy: WFL for tasks assessed/performed, Flat affect Overall Cognitive Status: Within Functional Limits for tasks assessed  General Comments: Flat throughout, but concentrated on progressing mobility        Exercises Other Exercises Other Exercises: sit <> stand EOB 10x, with cues for eccentric control and noted difficulty with this Other Exercises: able to transfer LVAD  batteries <> wall power without assistance  but extra time to manage lines    General Comments General comments (skin integrity, edema, etc.): Pleth unreliable, ranging from 80s-90s% on RA throughout      Pertinent Vitals/Pain Pain Assessment Pain Assessment: No/denies pain    Home Living                          Prior Function            PT Goals (current goals can now be found in the care plan section) Acute Rehab PT Goals Patient Stated Goal: to improve PT Goal Formulation: With patient Time For Goal Achievement: 07/22/22 Potential to Achieve Goals: Good Progress towards PT goals: Progressing toward goals    Frequency    Min 3X/week      PT Plan Current plan remains appropriate    Co-evaluation              AM-PAC PT "6 Clicks" Mobility   Outcome Measure  Help needed turning from your back to your side while in a flat bed without using bedrails?: A Little Help needed moving from lying on your back to sitting on the side of a flat bed without using bedrails?: A Little Help needed moving to and from a bed to a chair (including a wheelchair)?: A Little Help needed standing up from a chair using your arms (e.g., wheelchair or bedside chair)?: A Little Help needed to walk in hospital room?: A Little Help needed climbing 3-5 steps with a railing? : A Lot 6 Click Score: 17    End of Session   Activity Tolerance: Patient tolerated treatment well Patient left: with call bell/phone within reach;in bed;with bed alarm set (sitting EOB)   PT Visit Diagnosis: Unsteadiness on feet (R26.81);Difficulty in walking, not elsewhere classified (R26.2);Other abnormalities of gait and mobility (R26.89);Muscle weakness (generalized) (M62.81)     Time: 7412-8786 PT Time Calculation (min) (ACUTE ONLY): 30 min  Charges:  $Therapeutic Exercise: 23-37 mins                     Moishe Spice, PT, DPT Acute Rehabilitation Services  Office: [phone]    Orvan Falconer 07/21/2022, 10:37 AM

## 2022-07-21 NOTE — Progress Notes (Signed)
ANTICOAGULATION CONSULT NOTE   Pharmacy Consult for warfarin Indication:  LVAD/afib  No Known Allergies  Patient Measurements: Height: 5\' 11"  (180.3 cm) Weight: 86.4 kg (190 lb 7.6 oz) IBW/kg (Calculated) : 75.3  Vital Signs: Temp: 98.2 F (36.8 C) (12/01 0800) Temp Source: Oral (12/01 0800) BP: 101/87 (12/01 0800) Pulse Rate: 83 (12/01 0800)  Labs: Recent Labs    07/19/22 0413 07/20/22 0345 07/21/22 0310  HGB 8.2*  --  9.1*  HCT 25.6*  --  28.5*  PLT 568*  --  629*  LABPROT 21.5* 20.2* 21.5*  INR 1.9* 1.7* 1.9*  CREATININE 1.11 1.40* 1.18     Estimated Creatinine Clearance: 71.8 mL/min (by C-G formula based on SCr of 1.18 mg/dL).   Medical History: Past Medical History:  Diagnosis Date   Acute kidney injury ([place])    Acute respiratory failure (H[facility])    Atrial flutter ([facility])    on Eliquis   CHF (congestive heart failure) ([place])    EF 20-25% 10/15/21, AICD in place.   Coronary artery disease    Diabetes mellitus without complication (H[facility])    DVT (deep venous thrombosis) ([place]) 06/2019   Heart murmur    Hypertension    Presence of permanent cardiac pacemaker    Renal disorder     Assessment: 18 yom admitted with HF now s/p HM3 LVAD with LAA clip on 11/14. On apixaban PTA for hx Aflutter. Warfarin started on 11/17, previously managed by TCTS. Pharmacy consulted to manage warfarin dosing starting 11/30. INR subtherapeutic and trended down to 1.7. boost warfarin 5mg  last pm now INR 1.9 close to goal  Hgb stable 8-9 s/p 1ut prbc 11/27. LDH has been in the 200s and stable. 30 days of post op Aspirin 81 mg will end on 12/14.  Patient has not had much appetite post op > improving and drinking ensure tid  Goal of Therapy:  INR 2-2.5 Monitor platelets by anticoagulation protocol: Yes   Plan:  Warfarin 3mg  daily  Daily PT-INR, CBC  Monitor s/sx bleeding     Bonnita Nasuti Pharm.D. CPP, BCPS Clinical Pharmacist [phone] 07/21/2022 8:44 AM    07/21/2022  8:40  AM

## 2022-07-21 NOTE — Progress Notes (Signed)
Inpatient Rehab Admissions Coordinator:   Patient moving to CIR today. PA has written discharge order. Team/patient made aware.   Rehab Admissons Coordinator [place], [place], [place] [phone]

## 2022-07-21 NOTE — Progress Notes (Signed)
LVAD Coordinator Rounding Note:  HM 3 LVAD implanted on 07/04/22 by Dr Tenny Craw under destination criteria due to uncontrolled diabetes. Left atrial appendage clipped at time of surgery.  Pt asleep in the bed upon my arrival.   Plan for CIR when medically ready and bed available.   VAD discharge teaching completed with pt and caregiver. See separate note for documentation. Home equipment has been ordered.   ICD therapy back on yesterday. pt has decreased sensing now that VAD has been placed.   Vital signs: Temp: 98.1 HR: 82 NSR Doppler Pressure: not documented Arterial BP: 100/79 (87) O2 Sat: 93% RA Wt: 214.3>208.8>209.2>207.2>206.6>205.3>200>197.1>195.9>198.4>197.1>189.3>190.4 lb  LVAD interrogation reveals:  Speed: 5400 Flow: 4.6 Power: 4.1 w PI: 3.3   Alarms: none Events: 2 PI Hematocrit: 28  Fixed speed: 5400 Low speed limit: 5100   Drive Line:  CDI Drive line anchor secure. Next dressing change 07/22/22 to be performed by nurse champion, [place] coordinator, or trained caregiver-Jessica.   Labs:  LDH trend: 326>400>462>422>387>388>368>354>243>245>228>227>247  INR trend: 1.1>1.5>1.4>1.5>3.0>2.4>2.2>2.2>2.2>1.9>1.9>1.7>1.9  WBC trend: 12.6>18>20.9>17.4>11.5>11.0>15.1>16.4>13.2>11.7>9.6>10.7  Anticoagulation Plan: -INR Goal: 2.0 - 2.5 -ASA Dose: 81mg    Blood Products:  IntraOp 11/14:  - 4 FFP  - DDAVP - 617 cell saver  PostOp 11/14: - 2 FFP  07/17/22: - 1 PRBC  Device: -Medtronic -Therapies: on 07/20/22 VF > 214 BPM--ATP during charging VT > OFF  Arrythmias: AFIB RVR 11/17. Amiodarone transitioned to p.o. 07/12/22  Respiratory: Extubated 07/06/22  Renal:  -BUN:25>18>20>26>27>25>17>25>29>26>27  -CRT:1.28>1.23>1.44>1.46>1.47>1.27>1.1>1.34>1.28>1.11>1.4>1.18  Infection: 07/07/22>> blood cultures>> no growth 5 days; final 07/07/22>> expectorated sputum>>moderate capnocutophaga; final (On Zosyn x 1 week)    Pt Education: Completed discharge  education with [name] and [name]. See separate note for documentation.  [name] checked off to perform dressing changes independently.                                            Plan/Recommendations:  1. Page VAD coordinator for equipment or drive line issues. 2. Continue every other day drive line dressing changes per VAD coordinator, nurse champion, or trained caregiver-Jessica.  Tanda Rockers RN [place] Coordinator  Office: [phone]  24/7 Pager: [phone]

## 2022-07-21 NOTE — TOC Transition Note (Addendum)
Transition of Care [facility]) - CM/SW Discharge Note   Patient Details  Name: Darryl Frye MRN: [id number] Date of Birth: [birth year]  Transition of [facility]) CM/SW Contact:  Zenon Mayo, RN Phone Number: 07/21/2022, 11:59 AM   Clinical Narrative:    Patient is for dc to CIR , MD has dc order in . There is no note stating they will take patient today.      Barriers to Discharge: Continued Medical Work up   Patient Goals and CMS Choice Patient states their goals for this hospitalization and ongoing recovery are:: wants to remain independent      Discharge Placement                       Discharge Plan and Services   Discharge Planning Services: CM Consult                                 Social Determinants of Health (SDOH) Interventions Food Insecurity Interventions: Intervention Not Indicated Housing Interventions: Intervention Not Indicated Transportation Interventions: Intervention Not Indicated Utilities Interventions: Intervention Not Indicated Alcohol Usage Interventions: Intervention Not Indicated (Score <7) Financial Strain Interventions: Intervention Not Indicated   Readmission Risk Interventions     No data to display

## 2022-07-21 NOTE — Progress Notes (Signed)
This chaplain is present for F/U spiritual care. The Pt. is awake and taking care of household business from the side of the bed when the chaplain visited.  The chaplain listened reflectively as the Pt. shares the events of the day and his grateful heart throughout this admission.  The Pt. shares he is positively anticipating transfer to CIR today as it partners with his personal goal of being at home for Christmas. The Pt. verbalizes  the partnership of God's love with the ongoing support he has received and will continue to receive from the Pt. ex-wife-Jessica as he transitions home.  The chaplain understands the Pt. Is open to F/U spiritual care in CIR.  Chaplain Sallyanne Kuster [phone]

## 2022-07-21 NOTE — H&P (Incomplete)
Physical Medicine and Rehabilitation Admission H&P    CC: Debility secondary to chronic systolic congestive heart failure, nonischemic cardiomyopathy, cardiogenic shock  HPI: Darryl Frye is a 61 year old male with a history of atrial fibrillation/flutter nonischemic cardiomyopathy who underwent right heart catheterization on 06/27/2022.  He had presented with worsening symptoms, decompensated heart failure. This showed markedly elevated filling pressures, primarily pulmonary venous hypertension, low cardiac output and low PAPI.  He was admitted for inotrope and diuresis.  Started on milrinone and Lasix infusion.  He was evaluated by Dr. Tenny Craw on 11/8 for LVAD.  Eliquis was held and he was started on a heparin drip.  He agreed to participate in the [facility] trial and signed the informed consent on 11/13.  He underwent left ventricular assist device, TEE and left atrial appendage closure on 11/14.  Admitted to ICU and critical care medicine consulted.  He was able to be extubated on 11/16.  Low-grade fever and leukocytosis and placed on vancomycin and cefepime. Developed A-fib with RVR responded well to amiodarone and back in normal sinus rhythm.  Warfarin and aspirin started.  Acute kidney injury improving.  ENT consultation by Dr. Constance Holster obtained on 11/22 due to persistent leading from left nare.  Left side was cauterized and packing placed.  Last chest tube discontinued on 11/22.  Antibiotics changed to Zosyn given persistent infiltrates on chest x-ray and sputum culture positive for capnocytophaga.  Diuresis continued.  O2 requirement slowly proving.  Dyspnea on exertion continued 11/24.  Tolerating diet.  Lasix infusion stopped 11/25.  No further epistaxis and packing removed 11/27.  Has PICC line right upper extremity.  Foley catheter discontinued and he is voiding spontaneously.  The patient completed discharge education from heart failure nursing team. The patient requires inpatient physical  medicine and rehabilitation evaluations and treatment secondary to dysfunction due to chronic congestive heart failure, cardiomyopathy, cardiogenic shock.  Was initially diagnosed with congestive heart failure in 2020 at [facility] in [place], [place].  At that time he was using alcohol and cocaine but stopped all substances then and has not relapsed.  He has a history of DVT on apixaban.  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   The patient underwent palliative care consultation on 11/8 and he has named his ex-wife, [name], and his daughter, [name] as healthcare power of attorneys. Review of Systems  Constitutional:  Negative for chills and fever.  HENT:  Negative for congestion and sore throat.   Eyes:  Negative for blurred vision and double vision.  Respiratory:  Positive for shortness of breath. Negative for cough.   Cardiovascular:  Negative for chest pain and leg swelling.  Gastrointestinal:  Negative for constipation, nausea and vomiting.       Just had satisfactory normal BM  Genitourinary:  Negative for dysuria and urgency.  Musculoskeletal:  Negative for falls and myalgias.  Neurological:  Negative for dizziness and headaches.  Psychiatric/Behavioral:  Negative for depression. The patient does not have insomnia.    Past Medical History:  Diagnosis Date   Acute kidney injury ([facility])    Acute respiratory failure ([facility])    Atrial flutter ([place])    on Eliquis   CHF (congestive heart failure) ([place])    EF 20-25% 10/15/21, AICD in place.   Coronary artery disease    Diabetes mellitus without complication (H[facility])    DVT (deep venous thrombosis) (  [place]) 06/2019   Heart murmur    Hypertension    Presence of permanent cardiac pacemaker    Renal disorder    Past Surgical History:  Procedure Laterality Date   A-FLUTTER ABLATION N/A 01/12/2022   Procedure: A-FLUTTER ABLATION;   Surgeon: Evans Lance, MD;  Location: [facility];  Service: Cardiovascular;  Laterality: N/A;   COLONOSCOPY     Dr. Posey Pronto; One 4-6 mm hyperplastic rectal polyp, mid left-sided diverticulosis.   CYST REMOVAL NECK     HERNIA REPAIR     INSERTION OF IMPLANTABLE LEFT VENTRICULAR ASSIST DEVICE N/A 07/04/2022   Procedure: INSERTION OF IMPLANTABLE LEFT VENTRICULAR ASSIST DEVICE; ATRICLIP 77;  Surgeon: Neomia Glass, MD;  Location: [facility];  Service: Open Heart Surgery;  Laterality: N/A;   RIGHT HEART CATH N/A 06/27/2022   Procedure: RIGHT HEART CATH;  Surgeon: Larey Dresser, MD;  Location: [facility];  Service: Cardiovascular;  Laterality: N/A;   RIGHT HEART CATH N/A 06/29/2022   Procedure: RIGHT HEART CATH;  Surgeon: Larey Dresser, MD;  Location: [facility];  Service: Cardiovascular;  Laterality: N/A;   TEE WITHOUT CARDIOVERSION N/A 07/04/2022   Procedure: TRANSESOPHAGEAL ECHOCARDIOGRAM (TEE);  Surgeon: Neomia Glass, MD;  Location: [employer];  Service: Open Heart Surgery;  Laterality: N/A;   TONSILLECTOMY     Family History  Problem Relation Age of Onset   Hyperlipidemia Mother    Heart disease Mother        has PPM   Diabetes Mother    Hypertension Mother    Heart disease Maternal Grandmother    Prostate cancer Maternal Grandfather    Colon cancer Neg Hx    Colon polyps Neg Hx    Social History:  reports that he quit smoking about 3 years ago. His smoking use included cigarettes. He has a 20.00 pack-year smoking history. He has never used smokeless tobacco. He reports that he does not currently use alcohol. He reports that he does not currently use drugs after having used the following drugs: "Crack" cocaine. Allergies: No Known Allergies Medications Prior to Admission  Medication Sig Dispense Refill   apixaban (ELIQUIS) 5 MG TABS tablet Take 5 mg by mouth 2 (two) times daily.     atorvastatin (LIPITOR) 20 MG tablet Take 20 mg by mouth daily.     furosemide  (LASIX) 20 MG tablet Take 2 tablets (40 mg total) by mouth daily. (Patient taking differently: Take 40-60 mg by mouth See admin instructions. Taking 3 tabs 60 mg in the AM and 2 tabs ( 40 mg) in the afternoon) 180 tablet 3   insulin glargine (LANTUS) 100 UNIT/ML Solostar Pen Inject 40 Units into the skin daily.     JARDIANCE 25 MG TABS tablet Take 25 mg by mouth daily.     levothyroxine (SYNTHROID) 25 MCG tablet Take 25 mcg by mouth daily before breakfast.     magnesium oxide (MAG-OX) 400 MG tablet Take 400 mg by mouth 2 (two) times daily.     metFORMIN (GLUCOPHAGE) 1000 MG tablet Take 1,000 mg by mouth 2 (two) times daily with a meal.     metoprolol succinate (TOPROL-XL) 25 MG 24 hr tablet Take 0.5 tablets (12.5 mg total) by mouth daily. 15 tablet 5   sacubitril-valsartan (ENTRESTO) 24-26 MG Take 1 tablet by mouth 2 (two) times daily. 180 tablet 2   sertraline (ZOLOFT) 50 MG tablet Take 50 mg by mouth daily.  spironolactone (ALDACTONE) 25 MG tablet Take 1 tablet (25 mg total) by mouth daily. 90 tablet 2      Home: Home Living Family/patient expects to be discharged to:: Private residence Living Arrangements: Alone Available Help at Discharge: Family, Friend(s), Available 24 hours/day Type of Home: Apartment Home Access: Elevator Home Layout: One level Bathroom Shower/Tub: Chiropodist: Standard Bathroom Accessibility: Yes Home Equipment: Cane - quad   Functional History: Prior Function Prior Level of Function : Independent/Modified Independent, Driving Mobility Comments: independent with mobility, intermittent use of SPC for ambulation ADLs Comments: independent for IADLS, self care, was working part time up until a few months ago  Functional Status:  Mobility: Bed Mobility Overal bed mobility: Needs Assistance Bed Mobility: Supine to Sit Supine to sit: Supervision Sit to supine: Min assist General bed mobility comments: Pt sitting EOB upon  arrival. Transfers Overall transfer level: Needs assistance Equipment used: None, Rollator (4 wheels) Transfers: Sit to/from Stand Sit to Stand: Min guard, Min assist General transfer comment: Min guard assist for safety and occasional assistance to lock rollator brakes before sitting in it, slow to rise needing cues for eccentric control to sit. Ambulation/Gait Ambulation/Gait assistance: Min guard Gait Distance (Feet): 80 Feet (x3 bouts of ~76 ft > ~54 ft > ~80 ft) Assistive device: Rollator (4 wheels) Gait Pattern/deviations: Step-through pattern, Decreased stride length (knees coming close to buckling) General Gait Details: Pt with slow gait and noted knee instability that increased as distance progressed, almost buckling a couple times but did not fully buckle. Pt needing close min guard assist for safety. x2 several min duration seated rest breaks between bouts of gait Gait velocity: decreased Gait velocity interpretation: <1.31 ft/sec, indicative of household ambulator    ADL: ADL Overall ADL's : Needs assistance/impaired Eating/Feeding: Set up, Bed level Grooming: Wash/dry hands, Wash/dry face, Set up, Bed level Upper Body Bathing: Minimal assistance, Bed level Lower Body Bathing: Maximal assistance, Bed level Upper Body Dressing : Maximal assistance, Sitting Upper Body Dressing Details (indicate cue type and reason): LVAD vest and backside gown Lower Body Dressing: Maximal assistance, Bed level Toilet Transfer: Minimal assistance, +2 for physical assistance, +2 for safety/equipment, Ambulation Toilet Transfer Details (indicate cue type and reason): eva walker. simulated. Toileting- Clothing Manipulation and Hygiene: Maximal assistance, Sit to/from stand Functional mobility during ADLs: Minimal assistance (eva walker) General ADL Comments: session focused on activity tolerance. Pt goal to walk out of 2H ICU to [place] (new bed)  Cognition: Cognition Overall Cognitive Status:  Within Functional Limits for tasks assessed Orientation Level: Oriented X4 Cognition Arousal/Alertness: Awake/alert Behavior During Therapy: WFL for tasks assessed/performed, Flat affect Overall Cognitive Status: Within Functional Limits for tasks assessed General Comments: Flat throughout, but concentrated on progressing mobility  Physical Exam: Blood pressure 100/79, pulse 82, temperature 98.1 F (36.7 C), temperature source Oral, resp. rate 20, height _0  (1.803 m), weight 86.4 kg, SpO2 93 %. Physical Exam Constitutional:      General: He is not in acute distress. HENT:     Head: Normocephalic and atraumatic.  Eyes:     Extraocular Movements: Extraocular movements intact.     Pupils: Pupils are equal, round, and reactive to light.  Cardiovascular:     Rate and Rhythm: Normal rate and regular rhythm.  Pulmonary:     Effort: Pulmonary effort is normal.     Breath sounds: Normal breath sounds.     Comments: On 3 L O2 via  Abdominal:     General: Bowel  sounds are normal.     Palpations: Abdomen is soft.  Musculoskeletal:        General: No swelling or deformity.  Skin:    General: Skin is warm and dry.  Neurological:     General: No focal deficit present.     Mental Status: He is oriented to person, place, and time.  Psychiatric:        Mood and Affect: Mood normal.        Behavior: Behavior normal.     Results for orders placed or performed during the hospital encounter of 06/27/22 (from the past 48 hour(s))  Glucose, capillary     Status: Abnormal   Collection Time: 07/19/22 11:16 AM  Result Value Ref Range   Glucose-Capillary 314 (H) 70 - 99 mg/dL    Comment: Glucose reference range applies only to samples taken after fasting for at least 8 hours.  Glucose, capillary     Status: Abnormal   Collection Time: 07/19/22  5:07 PM  Result Value Ref Range   Glucose-Capillary 239 (H) 70 - 99 mg/dL    Comment: Glucose reference range applies only to samples taken after  fasting for at least 8 hours.  Glucose, capillary     Status: Abnormal   Collection Time: 07/19/22  9:25 PM  Result Value Ref Range   Glucose-Capillary 226 (H) 70 - 99 mg/dL    Comment: Glucose reference range applies only to samples taken after fasting for at least 8 hours.  Lactate dehydrogenase     Status: Abnormal   Collection Time: 07/20/22  3:45 AM  Result Value Ref Range   LDH 227 (H) 98 - 192 U/L    Comment: Performed at [facility], [place] [address], [place], [place] [address]  Cooxemetry Panel (carboxy, met, total hgb, O2 sat)     Status: Abnormal   Collection Time: 07/20/22  3:45 AM  Result Value Ref Range   Total hemoglobin 9.1 (L) 12.0 - 16.0 g/dL   O2 Saturation 69.2 %   Carboxyhemoglobin 2.8 (H) 0.5 - 1.5 %   Methemoglobin <0.7 0.0 - 1.5 %    Comment: Performed at [facility] [address] [place], [place] [address]  Basic metabolic panel     Status: Abnormal   Collection Time: 07/20/22  3:45 AM  Result Value Ref Range   Sodium 133 (L) 135 - 145 mmol/L   Potassium 4.1 3.5 - 5.1 mmol/L   Chloride 92 (L) 98 - 111 mmol/L   CO2 28 22 - 32 mmol/L   Glucose, Bld 140 (H) 70 - 99 mg/dL    Comment: Glucose reference range applies only to samples taken after fasting for at least 8 hours.   BUN 27 (H) 6 - 20 mg/dL   Creatinine, Ser 1.40 (H) 0.61 - 1.24 mg/dL   Calcium 9.2 8.9 - 10.3 mg/dL   GFR, Estimated 58 (L) >60 mL/min    Comment: (NOTE) Calculated using the CKD-EPI Creatinine Equation (2021)    Anion gap 13 5 - 15    Comment: Performed at [facility] [address] [place], [place] [address]  Protime-INR     Status: Abnormal   Collection Time: 07/20/22  3:45 AM  Result Value Ref Range   Prothrombin Time 20.2 (H) 11.4 - 15.2 seconds   INR 1.7 (H) 0.8 - 1.2    Comment: (NOTE) INR goal varies based on device and disease states. Performed at [facility],  [address] [place], [place] [address]   Magnesium     Status: None    Collection Time: 07/20/22  3:45 AM  Result Value Ref Range   Magnesium 2.3 1.7 - 2.4 mg/dL    Comment: Performed at [facility] [address], [place], [place] [address]  Glucose, capillary     Status: Abnormal   Collection Time: 07/20/22  6:08 AM  Result Value Ref Range   Glucose-Capillary 148 (H) 70 - 99 mg/dL    Comment: Glucose reference range applies only to samples taken after fasting for at least 8 hours.  Glucose, capillary     Status: Abnormal   Collection Time: 07/20/22 10:52 AM  Result Value Ref Range   Glucose-Capillary 108 (H) 70 - 99 mg/dL    Comment: Glucose reference range applies only to samples taken after fasting for at least 8 hours.  Glucose, capillary     Status: Abnormal   Collection Time: 07/20/22  4:19 PM  Result Value Ref Range   Glucose-Capillary 271 (H) 70 - 99 mg/dL    Comment: Glucose reference range applies only to samples taken after fasting for at least 8 hours.  Glucose, capillary     Status: Abnormal   Collection Time: 07/20/22  9:20 PM  Result Value Ref Range   Glucose-Capillary 226 (H) 70 - 99 mg/dL    Comment: Glucose reference range applies only to samples taken after fasting for at least 8 hours.   Comment 1 Notify RN   Lactate dehydrogenase     Status: Abnormal   Collection Time: 07/21/22  3:10 AM  Result Value Ref Range   LDH 247 (H) 98 - 192 U/L    Comment: Performed at [facility], [place] [address] [place], [place] [address]  Cooxemetry Panel (carboxy, met, total hgb, O2 sat)     Status: Abnormal   Collection Time: 07/21/22  3:10 AM  Result Value Ref Range   Total hemoglobin 9.7 (L) 12.0 - 16.0 g/dL   O2 Saturation 50.4 %   Carboxyhemoglobin 1.7 (H) 0.5 - 1.5 %   Methemoglobin 1.4 0.0 - 1.5 %    Comment: Performed at [facility] [address] [place], [place] [address]  Basic metabolic panel     Status: Abnormal   Collection Time: 07/21/22  3:10 AM  Result Value Ref Range   Sodium 134 (L) 135 - 145 mmol/L    Potassium 4.3 3.5 - 5.1 mmol/L   Chloride 90 (L) 98 - 111 mmol/L   CO2 27 22 - 32 mmol/L   Glucose, Bld 138 (H) 70 - 99 mg/dL    Comment: Glucose reference range applies only to samples taken after fasting for at least 8 hours.   BUN 33 (H) 6 - 20 mg/dL   Creatinine, Ser 1.18 0.61 - 1.24 mg/dL   Calcium 9.8 8.9 - 10.3 mg/dL   GFR, Estimated >60 >60 mL/min    Comment: (NOTE) Calculated using the CKD-EPI Creatinine Equation (2021)    Anion gap 17 (H) 5 - 15    Comment: Performed at [facility] [address] [place], [place] [address]  Protime-INR     Status: Abnormal   Collection Time: 07/21/22  3:10 AM  Result Value Ref Range   Prothrombin Time 21.5 (H) 11.4 - 15.2 seconds   INR 1.9 (H) 0.8 - 1.2    Comment: (NOTE) INR goal varies based on device and disease states. Performed  at [facility], [place] [address] [place], [place] [address]   CBC     Status: Abnormal   Collection Time: 07/21/22  3:10 AM  Result Value Ref Range   WBC 10.7 (H) 4.0 - 10.5 K/uL   RBC 3.34 (L) 4.22 - 5.81 MIL/uL   Hemoglobin 9.1 (L) 13.0 - 17.0 g/dL   HCT 28.5 (L) 39.0 - 52.0 %   MCV 85.3 80.0 - 100.0 fL   MCH 27.2 26.0 - 34.0 pg   MCHC 31.9 30.0 - 36.0 g/dL   RDW 16.6 (H) 11.5 - 15.5 %   Platelets 629 (H) 150 - 400 K/uL   nRBC 0.0 0.0 - 0.2 %    Comment: Performed at [facility] [address] [place], [place] [address]  Digoxin level     Status: Abnormal   Collection Time: 07/21/22  3:10 AM  Result Value Ref Range   Digoxin Level 0.6 (L) 0.8 - 2.0 ng/mL    Comment: Performed at [facility], [place] [address] [place], [place] [address]  Glucose, capillary     Status: Abnormal   Collection Time: 07/21/22  6:11 AM  Result Value Ref Range   Glucose-Capillary 129 (H) 70 - 99 mg/dL    Comment: Glucose reference range applies only to samples taken after fasting for at least 8 hours.   Comment 1 Notify RN   Cooxemetry Panel (carboxy, met, total hgb, O2 sat)     Status:  Abnormal   Collection Time: 07/21/22  9:30 AM  Result Value Ref Range   Total hemoglobin 11.4 (L) 12.0 - 16.0 g/dL   O2 Saturation 43.8 %   Carboxyhemoglobin 2.1 (H) 0.5 - 1.5 %   Methemoglobin <0.7 0.0 - 1.5 %    Comment: Performed at [facility] [address] [place], [place] [address]  Glucose, capillary     Status: Abnormal   Collection Time: 07/21/22 10:56 AM  Result Value Ref Range   Glucose-Capillary 104 (H) 70 - 99 mg/dL    Comment: Glucose reference range applies only to samples taken after fasting for at least 8 hours.   No results found.    Blood pressure 100/79, pulse 82, temperature 98.1 F (36.7 C), temperature source Oral, resp. rate 20, height _0  (1.803 m), weight 86.4 kg, SpO2 93 %.  Medical Problem List and Plan: 1. Functional deficits secondary to debility secondary to acute on chronic heart failure, NICM, cardiogenic shock status post LVAD.  -patient may *** shower  -ELOS/Goals: *** 2.  Antithrombotics: -DVT/anticoagulation:  Pharmaceutical: Coumadin  -antiplatelet therapy: Aspirin 81 mg daily 3. Pain Management: Tylenol, oxycodone as needed, Lidoderm patch as needed 4. Mood/Behavior/Sleep: LCSW to evaluate and provide emotional support  -continue Zoloft 50 mg daily  -continue melatonin 3 mg nightly  -antipsychotic agents: n/a 5. Neuropsych/cognition: This patient is capable of making decisions on his own behalf. 6. Skin/Wound Care: Routine skin care checks  -patient's caregiver, [name], trained on every other day driveline dressing changes per VAD coordinator  7. Fluids/Electrolytes/Nutrition: Strict I's and O's and follow-up chemistries  -2 g sodium diet/1500 mL fluid restriction  -continue iron/vitamin supplements/mag ox  -daily weight  8: Acute on chronic HFrEF/nonischemic cardiomyopathy status post LVAD  -Continue spironolactone 25 mg daily  -Continue digoxin 0.125 mg daily  -Continue torsemide 40 mg twice daily -Continue warfarin  3 mg q 1600 hrs. Continue pharmacy consult/INR -Continue sildenafil 20 mg TID -Continue Jardiance 10 mg daily -  Continue magnesium oxide 400 mg twice daily -Page VAD coordinator for equipment or driveline issues -daily weight  9: Paroxysmal atrial fibrillation/flutter status post ablation -continue amiodarone 200 mg twice daily -Continue warfarin 3 mg q 1600 hrs.  10: Hospital acquired pneumonia Zosyn>>completed 7 days  -wean supplemental oxygen as able  -pulmonary hygiene  11: Hyponatremia: improved; follow-up BMP  -May eventually need tolvaptan and if persists when euvolemic per cardiology  12: Diabetes mellitus, type II: CBGs before every meal and nightly  -continue Jardiance 10 mg daily  -Semglee 25 units every morning and 22 units nightly  -NovoLog 5 units 3 times daily with meals  -Sliding scale insulin  13: Acute kidney injury: improved creatinine, BUN elevated, follow-up BMP 14: History of DVT: now on warfarin 15: Hypothyroidism: Continue Synthroid 16: Anemia: Status post 1 unit PRBCs; follow-up CBC  -Left epistaxis status post cauterization, resolved 17: Hyperlipidemia: Continue Lipitor 20 mg daily 18: GI prophylaxis: Continue Protonix 40 mg daily 19: Partial DNR code status: no chest compressions      ***  Barbie Banner, PA-C 07/21/2022

## 2022-07-21 NOTE — H&P (Signed)
Physical Medicine and Rehabilitation Admission H&P    CC: Debility secondary to chronic systolic congestive heart failure, nonischemic cardiomyopathy, cardiogenic shock  HPI: Darryl Frye is a 61 year old male with a history of atrial fibrillation/flutter nonischemic cardiomyopathy who underwent right heart catheterization on 06/27/2022.  He had presented with worsening symptoms, decompensated heart failure. This showed markedly elevated filling pressures, primarily pulmonary venous hypertension, low cardiac output and low PAPI.  He was admitted for inotrope and diuresis.  Started on milrinone and Lasix infusion.  He was evaluated by Dr. Tenny Craw on 11/8 for LVAD.  Eliquis was held and he was started on a heparin drip.  He agreed to participate in the [facility] trial and signed the informed consent on 11/13.  He underwent left ventricular assist device, TEE and left atrial appendage closure on 11/14.  Admitted to ICU and critical care medicine consulted.  He was able to be extubated on 11/16.  Low-grade fever and leukocytosis and placed on vancomycin and cefepime. Developed A-fib with RVR responded well to amiodarone and back in normal sinus rhythm.  Warfarin and aspirin started.  Acute kidney injury improving.  ENT consultation by Dr. Constance Holster obtained on 11/22 due to persistent leading from left nare.  Left side was cauterized and packing placed.  Last chest tube discontinued on 11/22.  Antibiotics changed to Zosyn given persistent infiltrates on chest x-ray and sputum culture positive for capnocytophaga.  Diuresis continued.  O2 requirement slowly proving.  Dyspnea on exertion continued 11/24.  Tolerating diet.  Lasix infusion stopped 11/25.  No further epistaxis and packing removed 11/27.  Has PICC line right upper extremity.  Foley catheter discontinued and he is voiding spontaneously.  The patient completed discharge education from heart failure nursing team. The patient requires inpatient physical  medicine and rehabilitation evaluations and treatment secondary to dysfunction due to chronic congestive heart failure, cardiomyopathy, cardiogenic shock.  Was initially diagnosed with congestive heart failure in 2020 at [facility] in [place], [place].  At that time he was using alcohol and cocaine but stopped all substances then and has not relapsed.  He has a history of DVT on apixaban.  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   The patient underwent palliative care consultation on 11/8 and he has named his ex-wife, Darryl Frye, and his daughter, Darryl Frye as healthcare power of attorneys. Had normal BM today. Review of Systems  Constitutional:  Negative for chills and fever.  HENT:  Negative for congestion and sore throat.   Eyes:  Negative for blurred vision and double vision.  Respiratory:  Positive for shortness of breath. Negative for cough.   Cardiovascular:  Negative for chest pain and leg swelling.  Gastrointestinal:  Negative for constipation, nausea and vomiting.       Just had satisfactory normal BM  Genitourinary:  Negative for dysuria and urgency.  Musculoskeletal:  Negative for falls and myalgias.  Neurological:  Negative for dizziness and headaches.  Psychiatric/Behavioral:  Negative for depression. The patient does not have insomnia.    Past Medical History:  Diagnosis Date   Acute kidney injury ([place])    Acute respiratory failure ([place])    Atrial flutter ([place])    on Eliquis   CHF (congestive heart failure) ([place])    EF 20-25% 10/15/21, AICD in place.   Coronary artery disease    Diabetes mellitus without complication ([place])  DVT (deep venous thrombosis) ([place]) 06/2019   Heart murmur    Hypertension    Presence of permanent cardiac pacemaker    Renal disorder    Past Surgical History:  Procedure Laterality Date   A-FLUTTER ABLATION N/A 01/12/2022   Procedure:  A-FLUTTER ABLATION;  Surgeon: Evans Lance, MD;  Location: [facility];  Service: Cardiovascular;  Laterality: N/A;   COLONOSCOPY     Dr. Posey Pronto; One 4-6 mm hyperplastic rectal polyp, mid left-sided diverticulosis.   CYST REMOVAL NECK     HERNIA REPAIR     INSERTION OF IMPLANTABLE LEFT VENTRICULAR ASSIST DEVICE N/A 07/04/2022   Procedure: INSERTION OF IMPLANTABLE LEFT VENTRICULAR ASSIST DEVICE; ATRICLIP 59;  Surgeon: Neomia Glass, MD;  Location: [facility];  Service: Open Heart Surgery;  Laterality: N/A;   RIGHT HEART CATH N/A 06/27/2022   Procedure: RIGHT HEART CATH;  Surgeon: Larey Dresser, MD;  Location: [facility];  Service: Cardiovascular;  Laterality: N/A;   RIGHT HEART CATH N/A 06/29/2022   Procedure: RIGHT HEART CATH;  Surgeon: Larey Dresser, MD;  Location: [facility];  Service: Cardiovascular;  Laterality: N/A;   TEE WITHOUT CARDIOVERSION N/A 07/04/2022   Procedure: TRANSESOPHAGEAL ECHOCARDIOGRAM (TEE);  Surgeon: Neomia Glass, MD;  Location: [facility];  Service: Open Heart Surgery;  Laterality: N/A;   TONSILLECTOMY     Family History  Problem Relation Age of Onset   Hyperlipidemia Mother    Heart disease Mother        has PPM   Diabetes Mother    Hypertension Mother    Heart disease Maternal Grandmother    Prostate cancer Maternal Grandfather    Colon cancer Neg Hx    Colon polyps Neg Hx    Social History:  reports that he quit smoking about 3 years ago. His smoking use included cigarettes. He has a 20.00 pack-year smoking history. He has never used smokeless tobacco. He reports that he does not currently use alcohol. He reports that he does not currently use drugs after having used the following drugs: "Crack" cocaine. Allergies: No Known Allergies Medications Prior to Admission  Medication Sig Dispense Refill   albuterol (PROVENTIL) (2.5 MG/3ML) 0.083% nebulizer solution Take 3 mLs (2.5 mg total) by nebulization every 4 (four) hours as needed for  wheezing or shortness of breath. 75 mL 12   amiodarone (PACERONE) 200 MG tablet Take 1 tablet (200 mg total) by mouth 2 (two) times daily.     [START ON 07/22/2022] aspirin 81 MG chewable tablet Chew 1 tablet (81 mg total) by mouth daily.     atorvastatin (LIPITOR) 20 MG tablet Take 20 mg by mouth daily.     [START ON 07/22/2022] bisacodyl (DULCOLAX) 10 MG suppository Place 1 suppository (10 mg total) rectally daily. 12 suppository 0   [START ON 07/22/2022] bisacodyl (DULCOLAX) 5 MG EC tablet Take 2 tablets (10 mg total) by mouth daily. 30 tablet 0   Chlorhexidine Gluconate Cloth 2 % PADS Apply 6 each topically daily.     dextrose 50 % solution Inject 0-50 mLs into the vein as needed for low blood sugar. 50 mL    [START ON 07/22/2022] digoxin (LANOXIN) 0.125 MG tablet Take 1 tablet (0.125 mg total) by mouth daily.     [START ON 07/22/2022] empagliflozin (JARDIANCE) 10 MG TABS tablet Take 1 tablet (10 mg total) by mouth daily. 30 tablet    [START ON 07/22/2022] Fe Fum-Vit C-Vit B12-FA (TRIGELS-F FORTE)  CAPS capsule Take 1 capsule by mouth daily after breakfast.  0   feeding supplement (ENSURE ENLIVE / ENSURE PLUS) LIQD Take 237 mLs by mouth 3 (three) times daily between meals. 237 mL 12   hydrALAZINE (APRESOLINE) 20 MG/ML injection Inject 1 mL (20 mg total) into the vein every 4 (four) hours as needed. 1 mL    influenza vac split quadrivalent PF (FLUARIX) 0.5 ML injection Inject 0.5 mLs into the muscle tomorrow at 10 am for 1 dose. 0.5 mL 0   insulin aspart (NOVOLOG) 100 UNIT/ML injection Inject 0-15 Units into the skin 3 (three) times daily with meals. 10 mL 11   insulin aspart (NOVOLOG) 100 UNIT/ML injection Inject 0-5 Units into the skin at bedtime. 10 mL 11   insulin aspart (NOVOLOG) 100 UNIT/ML injection Inject 5 Units into the skin 3 (three) times daily with meals. 10 mL 11   [START ON 07/22/2022] insulin glargine-yfgn (SEMGLEE) 100 UNIT/ML injection Inject 0.25 mLs (25 Units total) into the skin  daily. 10 mL 11   insulin glargine-yfgn (SEMGLEE) 100 UNIT/ML injection Inject 0.22 mLs (22 Units total) into the skin at bedtime. 10 mL 11   [START ON 07/22/2022] levothyroxine (SYNTHROID) 25 MCG tablet Take 1 tablet (25 mcg total) by mouth daily at 6 (six) AM.     lidocaine (LIDODERM) 5 % Place 2 patches onto the skin daily. Remove & Discard patch within 12 hours or as directed by MD 30 patch 0   magnesium oxide (MAG-OX) 400 (240 Mg) MG tablet Take 1 tablet (400 mg total) by mouth 2 (two) times daily.     melatonin 3 MG TABS tablet Take 1 tablet (3 mg total) by mouth at bedtime.  0   Mouthwashes (MOUTH RINSE) LIQD solution 15 mLs by Mouth Rinse route as needed (for oral care).  0   [START ON 07/22/2022] Multiple Vitamin (MULTIVITAMIN WITH MINERALS) TABS tablet Take 1 tablet by mouth daily.     ondansetron (ZOFRAN) 4 MG/2ML SOLN injection Inject 2 mLs (4 mg total) into the vein every 6 (six) hours as needed for nausea. 2 mL 0   ondansetron (ZOFRAN) 4 MG/2ML SOLN injection Inject 2 mLs (4 mg total) into the vein every 6 (six) hours as needed for nausea or vomiting. 2 mL 0   [START ON 07/22/2022] pantoprazole (PROTONIX) 40 MG tablet Take 1 tablet (40 mg total) by mouth daily.     pneumococcal 20-valent conjugate vaccine (PREVNAR 20) 0.5 ML injection Inject 0.5 mLs into the muscle tomorrow at 10 am for 1 dose. 0.5 mL 0   polyethylene glycol (MIRALAX / GLYCOLAX) 17 g packet Take 17 g by mouth 2 (two) times daily. 14 each 0   senna-docusate (SENOKOT-S) 8.6-50 MG tablet Take 1 tablet by mouth 2 (two) times daily.     sertraline (ZOLOFT) 50 MG tablet Take 50 mg by mouth daily.     sildenafil (REVATIO) 20 MG tablet Take 1 tablet (20 mg total) by mouth 3 (three) times daily. 10 tablet 0   spironolactone (ALDACTONE) 25 MG tablet Take 1 tablet (25 mg total) by mouth daily. 90 tablet 2   torsemide 40 MG TABS Take 40 mg by mouth 2 (two) times daily.     traMADol (ULTRAM) 50 MG tablet Take 1-2 tablets (50-100 mg  total) by mouth every 4 (four) hours as needed for moderate pain. 30 tablet    warfarin (COUMADIN) 3 MG tablet Take 1 tablet (3 mg total) by mouth daily at  4 PM.      Home: Home Living Family/patient expects to be discharged to:: Private residence Living Arrangements: Alone Available Help at Discharge: Family, Friend(s), Available 24 hours/day Type of Home: Apartment Home Access: Elevator Home Layout: One level Bathroom Shower/Tub: Chiropodist: Standard Bathroom Accessibility: Yes Home Equipment: Cane - quad   Functional History: Prior Function Prior Level of Function : Independent/Modified Independent, Driving Mobility Comments: independent with mobility, intermittent use of SPC for ambulation ADLs Comments: independent for IADLS, self care, was working part time up until a few months ago   Functional Status:  Mobility: Bed Mobility Overal bed mobility: Needs Assistance Bed Mobility: Supine to Sit Supine to sit: Supervision Sit to supine: Min assist General bed mobility comments: Pt sitting EOB upon arrival. Transfers Overall transfer level: Needs assistance Equipment used: None, Rollator (4 wheels) Transfers: Sit to/from Stand Sit to Stand: Min guard, Min assist General transfer comment: Min guard assist for safety and occasional assistance to lock rollator brakes before sitting in it, slow to rise needing cues for eccentric control to sit. Ambulation/Gait Ambulation/Gait assistance: Min guard Gait Distance (Feet): 80 Feet (x3 bouts of ~76 ft > ~54 ft > ~80 ft) Assistive device: Rollator (4 wheels) Gait Pattern/deviations: Step-through pattern, Decreased stride length (knees coming close to buckling) General Gait Details: Pt with slow gait and noted knee instability that increased as distance progressed, almost buckling a couple times but did not fully buckle. Pt needing close min guard assist for safety. x2 several min duration seated rest breaks  between bouts of gait Gait velocity: decreased Gait velocity interpretation: <1.31 ft/sec, indicative of household ambulator   ADL: ADL Overall ADL's : Needs assistance/impaired Eating/Feeding: Set up, Bed level Grooming: Wash/dry hands, Wash/dry face, Set up, Bed level Upper Body Bathing: Minimal assistance, Bed level Lower Body Bathing: Maximal assistance, Bed level Upper Body Dressing : Maximal assistance, Sitting Upper Body Dressing Details (indicate cue type and reason): LVAD vest and backside gown Lower Body Dressing: Maximal assistance, Bed level Toilet Transfer: Minimal assistance, +2 for physical assistance, +2 for safety/equipment, Ambulation Toilet Transfer Details (indicate cue type and reason): eva walker. simulated. Toileting- Clothing Manipulation and Hygiene: Maximal assistance, Sit to/from stand Functional mobility during ADLs: Minimal assistance (eva walker) General ADL Comments: session focused on activity tolerance. Pt goal to walk out of 2H ICU to [facility] (new bed)   Cognition: Cognition Overall Cognitive Status: Within Functional Limits for tasks assessed Orientation Level: Oriented X4 Cognition Arousal/Alertness: Awake/alert Behavior During Therapy: WFL for tasks assessed/performed, Flat affect Overall Cognitive Status: Within Functional Limits for tasks assessed General Comments: Flat throughout, but concentrated on progressing mobility    Physical Exam: Blood pressure 97/86, pulse 85, temperature 98.8 F (37.1 C), temperature source Oral, resp. rate 18, height 5\' 11"  (1.803 m), weight 86.7 kg, SpO2 96 %. Physical Exam Constitutional:      General: He is not in acute distress. HENT:     Head: Normocephalic and atraumatic.  Eyes:     Extraocular Movements: Extraocular movements intact.     Pupils: Pupils are equal, round, and reactive to light.  Cardiovascular:     Rate and Rhythm: Normal rate and regular rhythm.  Pulmonary:     Effort: Pulmonary effort  is normal.     Breath sounds: Normal breath sounds.     Comments: On 3 L O2 via [place] Abdominal:     General: Bowel sounds are normal.     Palpations: Abdomen is soft.  Musculoskeletal:        General: No swelling or deformity.  Skin:    General: Skin is warm and dry.  Neurological:     General: No focal deficit present.     Mental Status: He is oriented to person, place, and time. 5/5 strength throughout Psychiatric:        Mood and Affect: Mood normal.        Behavior: Behavior normal.    Results for orders placed or performed during the hospital encounter of 07/21/22 (from the past 48 hour(s))  Glucose, capillary     Status: Abnormal   Collection Time: 07/21/22  5:12 PM  Result Value Ref Range   Glucose-Capillary 152 (H) 70 - 99 mg/dL    Comment: Glucose reference range applies only to samples taken after fasting for at least 8 hours.   No results found.    Blood pressure 97/86, pulse 85, temperature 98.8 F (37.1 C), temperature source Oral, resp. rate 18, height 5\' 11"  (1.803 m), weight 86.7 kg, SpO2 96 %.  Medical Problem List and Plan: 1. Functional deficits secondary to debility secondary to acute on chronic heart failure, NICM, cardiogenic shock status post LVAD.  -patient may shower  -ELOS/Goals: 5-7 days  Admit to CIR  2.  Antithrombotics: -DVT/anticoagulation:  Pharmaceutical: Coumadin  -antiplatelet therapy: Aspirin 81 mg daily 3. Pain Management: Tylenol, oxycodone as needed, Lidoderm patch as needed 4. Depression: continue magnesium oxide.   -continue Zoloft 50 mg daily  -continue melatonin 3 mg nightly  -antipsychotic agents: n/a 5. Neuropsych/cognition: This patient is capable of making decisions on his own behalf. 6. Skin/Wound Care: Routine skin care checks  -patient's caregiver, [name], trained on every other day driveline dressing changes per VAD coordinator  7. Fluids/Electrolytes/Nutrition: Strict I's and O's and follow-up chemistries  -2 g  sodium diet/1500 mL fluid restriction  -continue iron/vitamin supplements/mag ox  -daily weight  8: Acute on chronic HFrEF/nonischemic cardiomyopathy status post LVAD  -Continue spironolactone 25 mg daily  -Continue digoxin 0.125 mg daily  -Continue torsemide 40 mg twice daily -Continue warfarin 3 mg q 1600 hrs. Continue pharmacy consult/INR -Continue sildenafil 20 mg TID -Continue Jardiance 10 mg daily -Continue magnesium oxide 400 mg twice daily -Page VAD coordinator for equipment or driveline issues -daily weight  9: Paroxysmal atrial fibrillation/flutter status post ablation -continue amiodarone 200 mg twice daily -Continue warfarin 3 mg q 1600 hrs.  10: Hospital acquired pneumonia Zosyn>>completed 7 days  -wean supplemental oxygen as able  -pulmonary hygiene  11: Hyponatremia: improved; follow-up BMP  -May eventually need tolvaptan and if persists when euvolemic per cardiology  12: Diabetes mellitus, type II: CBGs before every meal and nightly  -continue Jardiance 10 mg daily  -Semglee 25 units every morning and 22 units nightly  -NovoLog 5 units 3 times daily with meals  -Sliding scale insulin  Check magnesium level tomorrow 13: Acute kidney injury: improved creatinine, BUN elevated, follow-up BMP 14: History of DVT: now on warfarin 15: Hypothyroidism: Continue Synthroid 16: Anemia: Status post 1 unit PRBCs; follow-up CBC  -Left epistaxis status post cauterization, resolved 17: Hyperlipidemia: Continue Lipitor 20 mg daily 18: GI prophylaxis: Continue Protonix 40 mg daily 19: Partial DNR code status: no chest compressions 20. Screening for vitamin D deficiency: check vitamin D level tomorrow.   I have personally performed a face to face diagnostic evaluation, including, but not limited to relevant history and physical exam findings, of this patient and developed relevant assessment and plan.  Additionally,  I have reviewed and concur with the physician assistant's  documentation above.  Risa Grill, PA-C  Izora Ribas, MD 07/21/2022

## 2022-07-21 NOTE — Progress Notes (Signed)
Spoke with Forestine Na, NP via secure chat ok to leave PICC in place at this time.

## 2022-07-22 DIAGNOSIS — Z95811 Presence of heart assist device: Secondary | ICD-10-CM

## 2022-07-22 DIAGNOSIS — R7989 Other specified abnormal findings of blood chemistry: Secondary | ICD-10-CM

## 2022-07-22 DIAGNOSIS — I5022 Chronic systolic (congestive) heart failure: Secondary | ICD-10-CM

## 2022-07-22 DIAGNOSIS — I509 Heart failure, unspecified: Secondary | ICD-10-CM

## 2022-07-22 LAB — CBC WITH DIFFERENTIAL/PLATELET
Abs Immature Granulocytes: 0.05 10*3/uL (ref 0.00–0.07)
Basophils Absolute: 0.1 10*3/uL (ref 0.0–0.1)
Basophils Relative: 1 %
Eosinophils Absolute: 0.2 10*3/uL (ref 0.0–0.5)
Eosinophils Relative: 2 %
HCT: 26.9 % — ABNORMAL LOW (ref 39.0–52.0)
Hemoglobin: 8.4 g/dL — ABNORMAL LOW (ref 13.0–17.0)
Immature Granulocytes: 1 %
Lymphocytes Relative: 13 %
Lymphs Abs: 1.1 10*3/uL (ref 0.7–4.0)
MCH: 26.9 pg (ref 26.0–34.0)
MCHC: 31.2 g/dL (ref 30.0–36.0)
MCV: 86.2 fL (ref 80.0–100.0)
Monocytes Absolute: 0.9 10*3/uL (ref 0.1–1.0)
Monocytes Relative: 10 %
Neutro Abs: 6.4 10*3/uL (ref 1.7–7.7)
Neutrophils Relative %: 73 %
Platelets: 607 10*3/uL — ABNORMAL HIGH (ref 150–400)
RBC: 3.12 MIL/uL — ABNORMAL LOW (ref 4.22–5.81)
RDW: 16.6 % — ABNORMAL HIGH (ref 11.5–15.5)
WBC: 8.8 10*3/uL (ref 4.0–10.5)
nRBC: 0 % (ref 0.0–0.2)

## 2022-07-22 LAB — COMPREHENSIVE METABOLIC PANEL WITH GFR
ALT: 63 U/L — ABNORMAL HIGH (ref 0–44)
AST: 30 U/L (ref 15–41)
Albumin: 2.6 g/dL — ABNORMAL LOW (ref 3.5–5.0)
Alkaline Phosphatase: 223 U/L — ABNORMAL HIGH (ref 38–126)
Anion gap: 13 (ref 5–15)
BUN: 32 mg/dL — ABNORMAL HIGH (ref 6–20)
CO2: 30 mmol/L (ref 22–32)
Calcium: 9.4 mg/dL (ref 8.9–10.3)
Chloride: 90 mmol/L — ABNORMAL LOW (ref 98–111)
Creatinine, Ser: 1.36 mg/dL — ABNORMAL HIGH (ref 0.61–1.24)
GFR, Estimated: 60 mL/min — ABNORMAL LOW
Glucose, Bld: 133 mg/dL — ABNORMAL HIGH (ref 70–99)
Potassium: 3.9 mmol/L (ref 3.5–5.1)
Sodium: 133 mmol/L — ABNORMAL LOW (ref 135–145)
Total Bilirubin: 0.4 mg/dL (ref 0.3–1.2)
Total Protein: 7.3 g/dL (ref 6.5–8.1)

## 2022-07-22 LAB — GLUCOSE, CAPILLARY
Glucose-Capillary: 79 mg/dL (ref 70–99)
Glucose-Capillary: 122 mg/dL — ABNORMAL HIGH (ref 70–99)
Glucose-Capillary: 71 mg/dL (ref 70–99)
Glucose-Capillary: 261 mg/dL — ABNORMAL HIGH (ref 70–99)

## 2022-07-22 LAB — VITAMIN D 25 HYDROXY (VIT D DEFICIENCY, FRACTURES): Vit D, 25-Hydroxy: 41.72 ng/mL (ref 30–100)

## 2022-07-22 LAB — PROTIME-INR
INR: 1.9 — ABNORMAL HIGH (ref 0.8–1.2)
Prothrombin Time: 21.3 s — ABNORMAL HIGH (ref 11.4–15.2)

## 2022-07-22 LAB — MAGNESIUM: Magnesium: 2.2 mg/dL (ref 1.7–2.4)

## 2022-07-22 MED ORDER — OYSTER SHELL CALCIUM/D3 500-5 MG-MCG PO TABS
1.0000 | ORAL_TABLET | Freq: Every day | ORAL | Status: DC
  Administered 2022-07-23 – 2022-07-31 (×9): 1 via ORAL
  Filled 2022-07-22 (×9): qty 1

## 2022-07-22 MED ORDER — INFLUENZA VAC SPLIT QUAD 0.5 ML IM SUSY
0.5000 mL | PREFILLED_SYRINGE | Freq: Once | INTRAMUSCULAR | Status: AC
  Administered 2022-07-24: 0.5 mL via INTRAMUSCULAR
  Filled 2022-07-22 (×2): qty 0.5

## 2022-07-22 MED ORDER — WARFARIN SODIUM 4 MG PO TABS
4.0000 mg | ORAL_TABLET | Freq: Once | ORAL | Status: AC
  Administered 2022-07-22: 4 mg via ORAL
  Filled 2022-07-22: qty 1

## 2022-07-22 MED ORDER — PNEUMOCOCCAL 20-VAL CONJ VACC 0.5 ML IM SUSY
0.5000 mL | PREFILLED_SYRINGE | Freq: Once | INTRAMUSCULAR | Status: AC
  Administered 2022-07-24: 0.5 mL via INTRAMUSCULAR
  Filled 2022-07-22 (×2): qty 0.5

## 2022-07-22 NOTE — Progress Notes (Signed)
Pharmacy note, admit review follow-up:  Influenza and Pneumonia vaccines ordered on 07/21/22 but not given prior to transfer. Per discussion with Dr. Naaman Plummer, vaccines have been re-ordered to be given today.  Arty Baumgartner, [place] 07/22/2022 11:51 AM

## 2022-07-22 NOTE — Evaluation (Signed)
Physical Therapy Assessment and Plan  Patient Details  Name: Darryl Frye MRN: [id number] Date of Birth: Jan 15, 1961  PT Diagnosis: Abnormality of gait, Difficulty walking, Impaired sensation, and Muscle weakness Rehab Potential: Good ELOS: 10-14 days   Today's Date: 07/22/2022 PT Individual Time: 1st Treatment Session: [id number]; 2nd Treatment Session: [id number] PT Individual Time Calculation (min): 75 min; 60 min  Hospital Problem: Principal Problem:   Heart failure ([place])   Past Medical History:  Past Medical History:  Diagnosis Date   Acute kidney injury ([place])    Acute respiratory failure ([place])    Atrial flutter ([place])    on Eliquis   CHF (congestive heart failure) ([place])    EF 20-25% 10/15/21, AICD in place.   Coronary artery disease    Diabetes mellitus without complication (HCC)    DVT (deep venous thrombosis) ([place]) 06/2019   Heart murmur    Hypertension    Presence of permanent cardiac pacemaker    Renal disorder    Past Surgical History:  Past Surgical History:  Procedure Laterality Date   A-FLUTTER ABLATION N/A 01/12/2022   Procedure: A-FLUTTER ABLATION;  Surgeon: Evans Lance, MD;  Location: [place] [facility];  Service: Cardiovascular;  Laterality: N/A;   COLONOSCOPY     Dr. Posey Pronto; One 4-6 mm hyperplastic rectal polyp, mid left-sided diverticulosis.   CYST REMOVAL NECK     HERNIA REPAIR     INSERTION OF IMPLANTABLE LEFT VENTRICULAR ASSIST DEVICE N/A 07/04/2022   Procedure: INSERTION OF IMPLANTABLE LEFT VENTRICULAR ASSIST DEVICE; ATRICLIP 3;  Surgeon: Neomia Glass, MD;  Location: [facility];  Service: Open Heart Surgery;  Laterality: N/A;   RIGHT HEART CATH N/A 06/27/2022   Procedure: RIGHT HEART CATH;  Surgeon: Larey Dresser, MD;  Location: [place] [facility];  Service: Cardiovascular;  Laterality: N/A;   RIGHT HEART CATH N/A 06/29/2022   Procedure: RIGHT HEART CATH;  Surgeon: Larey Dresser, MD;  Location: [place] [facility];  Service: Cardiovascular;   Laterality: N/A;   TEE WITHOUT CARDIOVERSION N/A 07/04/2022   Procedure: TRANSESOPHAGEAL ECHOCARDIOGRAM (TEE);  Surgeon: Neomia Glass, MD;  Location: [place];  Service: Open Heart Surgery;  Laterality: N/A;   TONSILLECTOMY      Assessment & Plan Clinical Impression: Patient is a 61 year old male with a history of atrial fibrillation/flutter nonischemic cardiomyopathy who underwent right heart catheterization on 06/27/2022. He had presented with worsening symptoms, decompensated heart failure. This showed markedly elevated filling pressures, primarily pulmonary venous hypertension, low cardiac output and low PAPI. He was admitted for inotrope and diuresis. Started on milrinone and Lasix infusion. He was evaluated by Dr. Tenny Craw on 11/8 for LVAD. Eliquis was held and he was started on a heparin drip. He agreed to participate in the [facility] trial and signed the informed consent on 11/13. He underwent left ventricular assist device, TEE and left atrial appendage closure on 11/14. Admitted to ICU and critical care medicine consulted. He was able to be extubated on 11/16. Low-grade fever and leukocytosis and placed on vancomycin and cefepime. Developed A-fib with RVR responded well to amiodarone and back in normal sinus rhythm. Warfarin and aspirin started. Acute kidney injury improving. ENT consultation by Dr. Constance Holster obtained on 11/22 due to persistent leading from left nare. Left side was cauterized and packing placed. Last chest tube discontinued on 11/22. Antibiotics changed to Zosyn given persistent infiltrates on chest x-ray and sputum culture positive for capnocytophaga. Diuresis continued. O2 requirement slowly proving. Dyspnea on exertion  continued 11/24. Tolerating diet. Lasix infusion stopped 11/25. No further epistaxis and packing removed 11/27. Has PICC line right upper extremity. Foley catheter discontinued and he is voiding spontaneously. The patient completed discharge education from heart  failure nursing team. The patient requires inpatient physical medicine and rehabilitation evaluations and treatment secondary to dysfunction due to chronic congestive heart failure, cardiomyopathy, cardiogenic shock.   Patient currently requires min with mobility secondary to muscle weakness, decreased cardiorespiratoy endurance and decreased oxygen support, and decreased standing balance and decreased balance strategies.  Prior to hospitalization, patient was independent  with mobility and lived with Alone in a Apartment (Third floor apartment) home.  Home access is  Elevator.  Patient will benefit from skilled PT intervention to maximize safe functional mobility, minimize fall risk, and decrease caregiver burden for planned discharge home with intermittent assist.  Anticipate patient will benefit from follow up OP at discharge.  PT - End of Session Activity Tolerance: Tolerates 30+ min activity with multiple rests Endurance Deficit: Yes Endurance Deficit Description: Activity tolerance/endurance deficits- Global deconditioning PT Assessment Rehab Potential (ACUTE/IP ONLY): Good PT Barriers to Discharge: Insurance for SNF coverage;Weight bearing restrictions;Wound Care PT Patient demonstrates impairments in the following area(s): Balance;Safety;Edema;Sensory;Endurance;Motor;Skin Integrity PT Transfers Functional Problem(s): Bed Mobility;Bed to Chair;Car PT Locomotion Functional Problem(s): Ambulation;Stairs;Wheelchair Mobility PT Plan PT Intensity: Minimum of 1-2 x/day ,45 to 90 minutes PT Frequency: 5 out of 7 days PT Duration Estimated Length of Stay: 10-14 days PT Treatment/Interventions: Ambulation/gait training;Community reintegration;DME/adaptive equipment instruction;Neuromuscular re-education;Psychosocial support;Stair training;UE/LE Strength taining/ROM;Balance/vestibular training;Discharge planning;Pain management;Skin care/wound management;Therapeutic Activities;UE/LE Coordination  activities;Cognitive remediation/compensation;Disease management/prevention;Functional mobility training;Patient/family education;Therapeutic Exercise PT Transfers Anticipated Outcome(s): ModI with LRAD PT Locomotion Anticipated Outcome(s): ModI with LRAD PT Recommendation Recommendations for Other Services: Neuropsych consult;Therapeutic Recreation consult Therapeutic Recreation Interventions: Stress management;Kitchen group Follow Up Recommendations: Outpatient PT Patient destination: Home Equipment Recommended: To be determined Equipment Details: TBD   PT Evaluation Precautions/Restrictions Precautions Precautions: Fall;Sternal Precaution Comments: LVAD, watch SpO2- 3L oxygen on inital eval, but weaned to 2L via [place] with SpO2 >90% Restrictions Other Position/Activity Restrictions: UE sternal precautions Pain Interference Pain Interference Pain Effect on Sleep: 1. Rarely or not at all Pain Interference with Therapy Activities: 1. Rarely or not at all Pain Interference with Day-to-Day Activities: 1. Rarely or not at all Home Living/Prior 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 Leisure: Hobbies-yes (Comment) (Patient enjoys going to [place] and washing his car- Enjoys being by himself) Vision/Perception  Vision - History Ability to See in Adequate Light: 1  Impaired Perception Perception: Within Functional Limits Praxis Praxis: Intact  Cognition Overall Cognitive Status: Within Functional Limits for tasks assessed Arousal/Alertness: Awake/alert Orientation Level: Oriented X4 Year: 2023 Month: December Day of Week: Correct Memory: Appears intact Awareness: Appears intact Problem Solving: Appears intact Safety/Judgment: Appears intact Sensation Sensation Light Touch: Appears Intact Additional Comments: Patient reports numbness/tingling in B feeet up toward his calves- States this was present prior to hospitalization Motor  Motor Motor: Other (comment) Motor - Skilled Clinical Observations: Global deconditioning   Trunk/Postural Assessment  Cervical Assessment Cervical Assessment: Within Functional Limits Thoracic Assessment Thoracic Assessment: Within Functional Limits Postural Control Postural Control: Deficits on evaluation Righting Reactions: Delayed  Balance Balance Balance Assessed: Yes Static Sitting Balance Static Sitting - Balance Support: Feet supported Static Sitting - Level of Assistance: 6: Modified independent (Device/Increase time) Dynamic Sitting Balance Dynamic Sitting - Balance Support: Feet supported;During functional activity Dynamic Sitting - Level of Assistance: 5: Stand by assistance Static Standing Balance Static Standing - Balance Support: During functional activity;Right upper extremity supported Static Standing - Level of Assistance: 4: Min assist Dynamic Standing Balance Dynamic Standing - Balance Support: Right upper extremity supported;During functional activity Dynamic Standing - Level of Assistance: 4: Min assist Extremity Assessment      RLE Assessment RLE Assessment: Exceptions to [facility] General Strength Comments: Global deconditioning- Grossly 3+/5 LLE Assessment LLE Assessment: Exceptions to [facility] General Strength Comments: Global deconditioning- Grossly 3+/5  Care Tool Care Tool Bed  Mobility Roll left and right activity   Roll left and right assist level: Contact Guard/Touching assist    Sit to lying activity   Sit to lying assist level: Contact Guard/Touching assist    Lying to sitting on side of bed activity   Lying to sitting on side of bed assist level: the ability to move from lying on the back to sitting on the side of the bed with no back support.: Contact Guard/Touching assist     Care Tool Transfers Sit to stand transfer   Sit to stand assist level: Minimal Assistance - Patient > 75%    Chair/bed transfer   Chair/bed transfer assist level: Minimal Assistance - Patient > 75%     Toilet transfer   Assist Level: Minimal Assistance - Patient > 75%    Car transfer   Car transfer assist level: Minimal Assistance - Patient > 75%      Care Tool Locomotion Ambulation   Assist level: Minimal Assistance - Patient > 75% Assistive device: Hand held assist Max distance: 20'  Walk 10 feet activity   Assist level: Minimal Assistance - Patient > 75% Assistive device: Hand held assist   Walk 50 feet with 2 turns activity Walk 50 feet with 2 turns activity did not occur: Safety/medical concerns (Unable to ambulate >20' at this time secondary to impaired endurance/activity tolerance)      Walk 150 feet activity Walk 150 feet activity did not occur: Safety/medical concerns      Walk 10 feet on uneven surfaces activity   Assist level: Minimal Assistance - Patient > 75% Assistive device: Hand held assist  Stairs   Assist level: Minimal Assistance - Patient > 75% Stairs assistive device: 2 hand rails Max number of stairs: 4  Walk up/down 1 step activity   Walk up/down 1 step (curb) assist level: Minimal Assistance - Patient > 75% Walk up/down 1 step or curb assistive device: 2 hand rails  Walk up/down 4 steps activity   Walk up/down 4 steps assist level: Minimal Assistance - Patient > 75% Walk up/down 4 steps assistive device: 2 hand rails  Walk up/down 12  steps activity Walk up/down 12 steps activity did not occur: Safety/medical concerns (Unable to ascend/descend >4 steps at this time secondary to impaired endurance/activity tolerance)      Pick up small objects from floor   Pick up small object from the floor assist level: Minimal Assistance - Patient > 75% Pick up small object from the floor assistive device: No AD  Wheelchair Is the patient using a  wheelchair?: No   Wheelchair activity did not occur: Safety/medical concerns (Unable to perform wheelchair mobility at this time secondary to sternal precautions)      Wheel 50 feet with 2 turns activity Wheelchair 50 feet with 2 turns activity did not occur: Safety/medical concerns    Wheel 150 feet activity Wheelchair 150 feet activity did not occur: Safety/medical concerns      Refer to Care Plan for [place] Term Goals  SHORT TERM GOAL WEEK 1 PT Short Term Goal 1 (Week 1): Patient will ambulate >50' with LRAD and CGA PT Short Term Goal 2 (Week 1): Patient will ascend/descend x4 steps with CGA PT Short Term Goal 3 (Week 1): Patient will continue to demonstrate proper understanding of HM3/Batteries  Recommendations for other services: Neuropsych and Therapeutic Recreation  Kitchen group and Stress management  Skilled Therapeutic Intervention Mobility Bed Mobility Bed Mobility: Rolling Right;Rolling Left;Supine to Sit;Sit to Supine Rolling Right: Contact Guard/Touching assist Rolling Left: Contact Guard/Touching assist Supine to Sit: Minimal Assistance - Patient > 75% Sit to Supine: Contact Guard/Touching assist Transfers Transfers: Sit to Stand;Stand to Sit;Stand Pivot Transfers Sit to Stand: Minimal Assistance - Patient > 75% Stand to Sit: Minimal Assistance - Patient > 75% Stand Pivot Transfers: Minimal Assistance - Patient > 75% Stand Pivot Transfer Details: Verbal cues for technique;Verbal cues for precautions/safety Transfer (Assistive device): 1 person hand held  assist Locomotion  Gait Ambulation: Yes Gait Assistance: Minimal Assistance - Patient > 75% Gait Distance (Feet): 20 Feet Assistive device: 1 person hand held assist Gait Assistance Details: Verbal cues for precautions/safety;Verbal cues for technique Gait Gait: Yes Gait Pattern: Step-to pattern Gait velocity: Decreased Stairs / Additional Locomotion Stairs: Yes Stairs Assistance: Minimal Assistance - Patient > 75% Stair Management Technique: Two rails Number of Stairs: 4 Height of Stairs: 6 Ramp: Minimal Assistance - Patient >75% Curb: Minimal Assistance - Patient >75% Wheelchair Mobility Wheelchair Mobility: No (Sternal precautions)   Skilled Intervention:  1st Treatment Session- Patient greeted supine in bed and agreeable to PT treatment session. Patient demonstrated good understanding of HM3 and how to properly attach battery packs, etc. Patient performed all bed mobility without bed rails and CGA for safety. Patient performed all functional mobility without the use of an AD and MinA for improved stability/safety. Patient demonstrates decreased endurance/activity tolerance limiting his functional mobility with global weakness. Patient present with "bouncy knees" throughout ambulation and while transitioning from standing to sitting secondary to weakness. Patient on 3L oxygen via [place] at start of treatment session, however weaned to 2L with SpO2 >90% with exertion. Patient was only able to tolerate gait trials <20' secondary to fatigue with extended seated rest breaks required. Patient returned to his room sitting upright in wheelchair with HM3 plugged back into wall unit, battery packs on charger, returned to 2L oxygen via wall port, call bell within reach and all needs met.    2nd Treatment Session- Patient greeted supine in bed and agreeable to PT treatment session. Patient transitioned from semi-reclined to sitting EOB with supv. While sitting EOB, patient was able to switch to the  battery packs and don harness independently. Patient performed sit/stand with rollator and CGA- Patient then ambulated ~20' from his bed to wheelchair in the hallway with rollator and CGA. VC for locking brakes prior to sitting with good carryover noted. Patient wheeled to rehab gym dependently for energy conservation.   Patient gait trained 2 x 64' with rollator and CGA for safety- Therapist managing oxygen tank/line throughout entire treatment  session. Patient required extended seated rest break in between each gait trial secondary to fatigue.   Sit/stand 2 x 5 without UE support and SBA for safety- Extended seated rest break required in between trials.   Patient gait trained x45', x55', x45' with rollator and CGA- Patient practiced using his rollator as a place to sit so as he fatigued he was educated on locating a wall or stable surface and placing the rollator against it, locking the brakes, turning toward the oxygen line/tank and sitting. Patient demonstrated good carryover and only required SBA.   Patient returned to his room where he performed stand pivot transfer from wheelchair to sitting EOB without AD and CGA/L HHA. Patient transitioned from sitting EOB to semi-reclined with supv. Patient re-connected his HM3 to the wall unit, all 4 batteries on charger, hooked up to 2L oxygen via [place] at the wall, call bell within reach, bed alarm on and all needs met.    Discharge Criteria: Patient will be discharged from PT if patient refuses treatment 3 consecutive times without medical reason, if treatment goals not met, if there is a change in medical status, if patient makes no progress towards goals or if patient is discharged from hospital.  The above assessment, treatment plan, treatment alternatives and goals were discussed and mutually agreed upon: by patient  [place] 07/22/2022, 9:59 AM

## 2022-07-22 NOTE — Progress Notes (Signed)
Physical Therapy Session Note  Patient Details  Name: Darryl Frye MRN: [id number] Date of Birth: [birth year]  Today's Date: 07/22/2022 PT Individual Time: 1020-11:17 PT Individual Time Calculation (min): 57 min   Short Term Goals: Week 1:  PT Short Term Goal 1 (Week 1): Patient will ambulate >50' with LRAD and CGA PT Short Term Goal 2 (Week 1): Patient will ascend/descend x4 steps with CGA PT Short Term Goal 3 (Week 1): Patient will continue to demonstrate proper understanding of HM3/Batteries  Skilled Therapeutic Interventions/Progress Updates:   Pt supine in bed on entrance. Pt alert and agreeable for treatment session. Pt provided with LVAD battery requiring min A for set up and total assist for oxygen setup (2L).  Pt remained on 2L of oxygen for entire treatment session. Pt reports no pain complaint during therapy session.Pt fitted for rolator and provided with education on precautions and equipment usage. Pt requested to use bathroom to void urine 20 minutes into session, required min A for transfer to toilet using RW. Cardiac MD was present early in treatment sessions to record LVAD reading.  Transported to therapy gym for time management and energy conservation.     Therapeutic Activity:  Transfers: Pt performed supine to sitting at EOB with close supervision and sit<> stand transfers at close supervision w/ Rolator and CGA w/o Rolator. Pt demonstrated stand pivot, and ambulatory transfers with close supervision using RW.  Balance:  Pt performed tug w/ using rolator with close supervision in 32.4 sec and w/o using rollator with CGA in 33.5 seconds. Demonstrated 5xSTS x 3 with out rolator at close supervision with an average score of 18.1 seconds.  Gait: Patient ambulated 85 ft in 1.09 sec using rolator before requiring rest break. Pt shows flat footed step, forward trunk lean, step through gait pattern, with decreased gait speed, and step height   Pt was provided with additional  education for LVAD from PT throughout treatment session. Pt able to perform functional activities but required consistent cuing for improved safety during functional activities. Pt fatigues quickly and requires frequent rest breaks during treatment session with cuing for controlled breathing. Vitals were recorded at th beginning (O2 98 sitting, 99 standing) and towards the end (sitting 89- 99) of treatment session. Concerns of accuracy with last O2 reading due Pt having cold hands and device taking greater than 30 seconds to calculate reading.  Pt transported back to room and performed stand pivot transfer with RW at close supervision with Mod A assistance for LVAD battery transfer and total assist transitioning from mobile to wall oxygen.  Pt seated upright at EOB with bed alarm on, call/bell close by and all needs met.  Therapy Documentation Precautions:  Precautions Precautions: Fall, Sternal Precaution Comments: LVAD, watch SpO2- 3L oxygen on inital eval, but weaned to 2L via [place] with SpO2 >90% Restrictions Other Position/Activity Restrictions: UE sternal precautions General:   Vital Signs: Therapy Vitals Temp: 97.8 F (36.6 C) Temp Source: Oral Pulse Rate: 83 Resp: 16 BP: 101/62 Patient Position (if appropriate): Lying Oxygen Therapy SpO2: 94 % O2 Device: Room Air Pain: Pain Assessment Pain Scale: 0-10 Pain Score: 0-No pain Patients Stated Pain Goal: 2 Multiple Pain Sites: No   Therapy/Group: Individual Therapy  Hilary Hertz PT, SPT  Hilary Hertz 07/22/2022, 2:36 PM

## 2022-07-22 NOTE — Progress Notes (Signed)
Patient ID: Darryl Frye, male   DOB: 03/26/1961, 61 y.o.   MRN: [id number]   Advanced Heart Failure VAD Team Note  PCP-Cardiologist: Carlyle Dolly, MD   Subjective:    Working with PT this morning.  Feels good.  On room air at rest.   LVAD INTERROGATION:  HeartMate 3 LVAD:   Flow 4.4 liters/min, speed 5400, power 4.1, PI 3.3.    Objective:    Vital Signs:   Temp:  [98.1 F (36.7 C)-98.8 F (37.1 C)] 98.6 F (37 C) (12/02 0413) Pulse Rate:  [78-85] 78 (12/02 0413) Resp:  [17-20] 18 (12/02 0413) BP: (95-100)/(72-86) 97/86 (12/02 0800) SpO2:  [93 %-96 %] 96 % (12/02 0413) Weight:  [86.7 kg-87.8 kg] 87.8 kg (12/02 0517) Last BM Date : 07/21/22 Mean arterial Pressure 80s-90s  Intake/Output:   Intake/Output Summary (Last 24 hours) at 07/22/2022 1044 Last data filed at 07/22/2022 0700 Gross per 24 hour  Intake 696 ml  Output 1050 ml  Net -354 ml     Physical Exam    General:  Well appearing. No resp difficulty HEENT: normal Neck: supple. JVP 8-9. Carotids 2+ bilat; no bruits. No lymphadenopathy or thyromegaly appreciated. Cor: Mechanical heart sounds with LVAD hum present. Lungs: clear Abdomen: soft, nontender, nondistended. No hepatosplenomegaly. No bruits or masses. Good bowel sounds. Driveline: C/D/I; securement device intact and driveline incorporated Extremities: no cyanosis, clubbing, rash, edema Neuro: alert & orientedx3, cranial nerves grossly intact. moves all 4 extremities w/o difficulty. Affect pleasant   Telemetry   NSR (personally reviewed)  Labs   Basic Metabolic Panel: Recent Labs  Lab 07/18/22 0531 07/19/22 0413 07/20/22 0345 07/21/22 0310 07/22/22 0313  NA 128* 129* 133* 134* 133*  K 4.2 3.6 4.1 4.3 3.9  CL 91* 91* 92* 90* 90*  CO2 26 27 28 27 30   GLUCOSE 138* 79 140* 138* 133*  BUN 29* 26* 27* 33* 32*  CREATININE 1.28* 1.11 1.40* 1.18 1.36*  CALCIUM 8.7* 8.4* 9.2 9.8 9.4  MG  --   --  2.3  --  2.2    Liver Function  Tests: Recent Labs  Lab 07/22/22 0313  AST 30  ALT 63*  ALKPHOS 223*  BILITOT 0.4  PROT 7.3  ALBUMIN 2.6*   No results for input(s): "LIPASE", "AMYLASE" in the last 168 hours. No results for input(s): "AMMONIA" in the last 168 hours.  CBC: Recent Labs  Lab 07/17/22 0445 07/18/22 0531 07/19/22 0413 07/21/22 0310 07/22/22 0313  WBC 13.2* 11.7* 9.6 10.7* 8.8  NEUTROABS  --   --   --   --  6.4  HGB 7.6* 8.3* 8.2* 9.1* 8.4*  HCT 23.1* 26.0* 25.6* 28.5* 26.9*  MCV 84.6 85.8 86.2 85.3 86.2  PLT 551* 595* 568* 629* 607*    INR: Recent Labs  Lab 07/18/22 0531 07/19/22 0413 07/20/22 0345 07/21/22 0310 07/22/22 0313  INR 1.9* 1.9* 1.7* 1.9* 1.9*    Other results: EKG:    Imaging   No results found.   Medications:     Scheduled Medications:  amiodarone  200 mg Oral BID   aspirin  81 mg Oral Daily   atorvastatin  20 mg Oral Daily   Chlorhexidine Gluconate Cloth  6 each Topical Daily   digoxin  0.125 mg Oral Daily   empagliflozin  10 mg Oral Daily   Fe Fum-Vit C-Vit B12-FA  1 capsule Oral QPC breakfast   influenza vac split quadrivalent PF  0.5 mL Intramuscular Once  insulin aspart  0-15 Units Subcutaneous TID WC   insulin aspart  0-5 Units Subcutaneous QHS   insulin aspart  5 Units Subcutaneous TID WC   insulin glargine-yfgn  22 Units Subcutaneous QHS   insulin glargine-yfgn  25 Units Subcutaneous Daily   levothyroxine  25 mcg Oral Q0600   magnesium oxide  400 mg Oral BID   melatonin  3 mg Oral QHS   multivitamin with minerals  1 tablet Oral Daily   pantoprazole  40 mg Oral Daily   pneumococcal 20-valent conjugate vaccine  0.5 mL Intramuscular Once   polyethylene glycol  17 g Oral BID   senna-docusate  1 tablet Oral BID   sertraline  50 mg Oral Daily   sildenafil  20 mg Oral TID   sodium chloride flush  10-40 mL Intracatheter Q12H   spironolactone  25 mg Oral Daily   torsemide  40 mg Oral BID   warfarin  3 mg Oral q1600   Warfarin - Pharmacist  Dosing Inpatient   Does not apply q1600    Infusions:  sodium chloride     sodium chloride      PRN Medications: sodium chloride, acetaminophen, albuterol, alum & mag hydroxide-simeth, diphenhydrAMINE, guaiFENesin-dextromethorphan, lidocaine, methocarbamol, mouth rinse, oxyCODONE, polyethylene glycol, prochlorperazine **OR** prochlorperazine **OR** prochlorperazine, sodium phosphate, sorbitol, traZODone    Assessment/Plan:    1. Acute on chronic systolic CHF/cardiogenic shock: Nonischemic cardiomyopathy, diagnosed 2020.  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  Cath in 12/20 with no significant coronary disease.  [employer] ICD. Most recent echo in 2/23 showed EF 20-25% with normal RV.  Recently, patient has been symptomatically worse, NYHA class IV with profound orthopnea. RHC showed markedly elevated filling pressures, primarily pulmonary venous hypertension, low cardiac output, and low PAPI.  Patient was admitted for inotrope and diuresis. Echo this admission with EF <20%, mod RV dysfunction.  He was started on milrinone with improvement in hemodynamics.  We proceeded with HM3 LVAD with LAA clip on 11/14.  He is now off milrinone and remains on sildenafil 20 tid.  Doing well today, on RA at rest.  JVP 8-9 range.  Stable LVAD parameters.  - Continue torsemide 40 mg bid.  - Continue sildenafil 20 tid.  - Continue 25 mg spiro daily.  - Continue digoxin 0.125 daily - Continue Jardiance 10 mg daily 2. A fib RVR, post-op: H/O S/p flutter ablation.  Remains in NSR.  - Continue po amiodarone 200 BID, decrease to daily at discharge.  - Continue warfarin.  3. VAD management: s/p HM-3 VAD on 11/14.  Bedside echo 11/18 LV small, VAD cannula well aligned, AoV opening every beat, septum pulling to left RV moderately HK, small  to moderate anterior effusion. Ramp echo 11/22 RV mild to mod HK. Speed turned to 5400. Follow LDH daily. MAP 80s generally.  - Now on warfarin with INR 1.9 - Continue ASA 81. - VAD interrogated personally. Parameters stable. 4. H/o DVT: On anticoagulation.  5. Acute hypoxemic respiratory failure, post-op: Resolved.  On RA at rest, using 2L [place] with exertion.  - Continue incentive spirometry. - Encourage mobility.  - Completed 1 week of Zosyn.  6. AKI: Creatinine stable at 1.36.  - Diuretic management as above 7. ID: Afebrile, WBCs 13. He completed initial vanc/cefepime empiric abx for ?post-op PNA. 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 CCM switched to zosyn given persistent infiltrates on imaging and he completed 1 week of Zosyn.  Now afebrile.  8. Hypothyroidism - On levothyroxine.  9. Hypokalemia - Continue spironolactone.  10. Epistaxis: s/p cauterization/packing 11/22, packing now removed.  11. Anemia: Hgb 8.4 today, follow.  12. Hyponatremia: Na 133. Fluid restrict.  24. Deconditioning - PT/OT work in [employer]  I reviewed the LVAD parameters from today, and compared the results to the patient's prior recorded data.  No programming changes were made.  The LVAD is functioning within specified parameters.  The patient performs LVAD self-test daily.  LVAD interrogation was negative for any significant power changes, alarms or PI events/speed drops.  LVAD equipment check completed and is in good working order.  Back-up equipment present.   LVAD education done on emergency procedures and precautions and reviewed exit site care.  Length of Stay: 1  Loralie Champagne, MD 07/22/2022, 10:44 AM  VAD Team --- VAD ISSUES ONLY--- Pager [phone] (7am - 7am)  Advanced Heart Failure Team  Pager [phone] (M-F; 7a - 5p)  Please contact [facility] for night-coverage after hours (5p -7a ) and weekends on [email or web address]

## 2022-07-22 NOTE — Plan of Care (Signed)
  Problem: Sit to Stand Goal: LTG:  Patient will perform sit to stand with assistance level (PT) Description: LTG:  Patient will perform sit to stand with assistance level (PT) Flowsheets (Taken 07/22/2022 1451) LTG: PT will perform sit to stand in preparation for functional mobility with assistance level: Independent with assistive device   Problem: RH Bed Mobility Goal: LTG Patient will perform bed mobility with assist (PT) Description: LTG: Patient will perform bed mobility with assistance, with/without cues (PT). Flowsheets (Taken 07/22/2022 1451) LTG: Pt will perform bed mobility with assistance level of: Independent with assistive device    Problem: RH Bed to Chair Transfers Goal: LTG Patient will perform bed/chair transfers w/assist (PT) Description: LTG: Patient will perform bed to chair transfers with assistance (PT). Flowsheets (Taken 07/22/2022 1451) LTG: Pt will perform Bed to Chair Transfers with assistance level: Independent with assistive device    Problem: RH Car Transfers Goal: LTG Patient will perform car transfers with assist (PT) Description: LTG: Patient will perform car transfers with assistance (PT). Flowsheets (Taken 07/22/2022 1451) LTG: Pt will perform car transfers with assist:: Independent with assistive device    Problem: RH Ambulation Goal: LTG Patient will ambulate in home environment (PT) Description: LTG: Patient will ambulate in home environment, # of feet with assistance (PT). Flowsheets (Taken 07/22/2022 1451) LTG: Pt will ambulate in home environ  assist needed:: Independent with assistive device LTG: Ambulation distance in home environment: 50' Goal: LTG Patient will ambulate in community environment (PT) Description: LTG: Patient will ambulate in community environment, # of feet with assistance (PT). Flowsheets (Taken 07/22/2022 1451) LTG: Pt will ambulate in community environ  assist needed:: Independent with assistive device LTG: Ambulation  distance in community environment: 150'   Problem: RH Stairs Goal: LTG Patient will ambulate up and down stairs w/assist (PT) Description: LTG: Patient will ambulate up and down # of stairs with assistance (PT) Flowsheets (Taken 07/22/2022 1451) LTG: Pt will ambulate up/down stairs assist needed:: Independent with assistive device LTG: Pt will  ambulate up and down number of stairs: 4

## 2022-07-22 NOTE — Progress Notes (Signed)
PROGRESS NOTE   Subjective/Complaints: Pt did well last night. No pains. Denies sob. About to get OOB with PT.   ROS: Patient denies fever, rash, sore throat, blurred vision, dizziness, nausea, vomiting, diarrhea, cough, shortness of breath or chest pain, joint or back/neck pain, headache, or mood change.    Objective:   No results found. Recent Labs    07/21/22 0310 07/22/22 0313  WBC 10.7* 8.8  HGB 9.1* 8.4*  HCT 28.5* 26.9*  PLT 629* 607*   Recent Labs    07/21/22 0310 07/22/22 0313  NA 134* 133*  K 4.3 3.9  CL 90* 90*  CO2 27 30  GLUCOSE 138* 133*  BUN 33* 32*  CREATININE 1.18 1.36*  CALCIUM 9.8 9.4    Intake/Output Summary (Last 24 hours) at 07/22/2022 1219 Last data filed at 07/22/2022 0700 Gross per 24 hour  Intake 696 ml  Output 1050 ml  Net -354 ml        Physical Exam: Vital Signs Blood pressure 101/62, pulse 83, temperature 97.8 F (36.6 C), temperature source Oral, resp. rate 16, height 5\' 11"  (1.803 m), weight 87.8 kg, SpO2 94 %.  General: Alert and oriented x 3, No apparent distress HEENT: Head is normocephalic, atraumatic, PERRLA, EOMI, sclera anicteric, oral mucosa pink and moist, dentition intact, ext ear canals clear,  Neck: Supple without JVD or lymphadenopathy Heart: LVAD hum. Site clean/dressed. Chest: CTA bilaterally without wheezes, rales, or rhonchi; no distress, O2 3L [place] Abdomen: Soft, non-tender, non-distended, bowel sounds positive. Extremities: No clubbing, cyanosis, or edema. Pulses are 2+ Psych: Pt's affect is appropriate. Pt is cooperative Skin: Clean and intact without signs of breakdown Neuro:  Alert and oriented x 3. Normal insight and awareness. Intact Memory. Normal language and speech. Cranial nerve exam unremarkable. Motor 4-5/5. Sensory exam normal for light touch and pain in all 4 limbs. No limb ataxia or cerebellar signs. No abnormal tone appreciated.    Musculoskeletal: some limitations d/t LVAD but normal rom for the most part.    Assessment/Plan: 1. Functional deficits which require 3+ hours per day of interdisciplinary therapy in a comprehensive inpatient rehab setting. Physiatrist is providing close team supervision and 24 hour management of active medical problems listed below. Physiatrist and rehab team continue to assess barriers to discharge/monitor patient progress toward functional and medical goals  Care Tool:  Bathing              Bathing assist       Upper Body Dressing/Undressing Upper body dressing        Upper body assist      Lower Body Dressing/Undressing Lower body dressing            Lower body assist       Toileting Toileting    Toileting assist       Transfers Chair/bed transfer  Transfers assist     Chair/bed transfer assist level: Minimal Assistance - Patient > 75%     Locomotion Ambulation   Ambulation assist      Assist level: Minimal Assistance - Patient > 75% Assistive device: Hand held assist Max distance: 20'   Walk 10 feet activity  Assist     Assist level: Minimal Assistance - Patient > 75% Assistive device: Hand held assist   Walk 50 feet activity   Assist Walk 50 feet with 2 turns activity did not occur: Safety/medical concerns (Unable to ambulate >20' at this time secondary to impaired endurance/activity tolerance)         Walk 150 feet activity   Assist Walk 150 feet activity did not occur: Safety/medical concerns         Walk 10 feet on uneven surface  activity   Assist     Assist level: Minimal Assistance - Patient > 75% Assistive device: Hand held assist   Wheelchair     Assist Is the patient using a wheelchair?: No   Wheelchair activity did not occur: Safety/medical concerns (Unable to perform wheelchair mobility at this time secondary to sternal precautions)         Wheelchair 50 feet with 2 turns  activity    Assist    Wheelchair 50 feet with 2 turns activity did not occur: Safety/medical concerns       Wheelchair 150 feet activity     Assist  Wheelchair 150 feet activity did not occur: Safety/medical concerns       Blood pressure 101/62, pulse 83, temperature 97.8 F (36.6 C), temperature source Oral, resp. rate 16, height 5\' 11"  (1.803 m), weight 87.8 kg, SpO2 94 %.  Medical Problem List and Plan: 1. Functional deficits secondary to debility secondary to acute on chronic heart failure, NICM, cardiogenic shock status post LVAD.             -patient may shower             -ELOS/Goals: 5-7 days          -Patient is beginning CIR therapies today including PT and OT   2.  Antithrombotics: -DVT/anticoagulation:  Pharmaceutical: Coumadin             -antiplatelet therapy: Aspirin 81 mg daily 3. Pain Management: Tylenol, oxycodone as needed, Lidoderm patch as needed 4. Depression: continue magnesium oxide.              -continue Zoloft 50 mg daily             -continue melatonin 3 mg nightly             -antipsychotic agents: n/a 5. Neuropsych/cognition: This patient is capable of making decisions on his own behalf. 6. Skin/Wound Care: Routine skin care checks             -patient's caregiver, [name], trained on every other day driveline dressing changes per VAD coordinator   7. Fluids/Electrolytes/Nutrition: Strict I's and O's and follow-up chemistries             -2 g sodium diet/1500 mL fluid restriction             -continue iron/vitamin supplements/mag ox             -good po intake at present   8: Acute on chronic HFrEF/nonischemic cardiomyopathy status post LVAD             -Continue spironolactone 25 mg daily             -Continue digoxin 0.125 mg daily        -Continue torsemide 40 mg twice daily -Continue warfarin 3 mg q 1600 hrs. Continue pharmacy consult/INR -Continue sildenafil 20 mg TID -Continue Jardiance 10 mg daily -Continue magnesium oxide 400  mg  twice daily -Page VAD coordinator for equipment or driveline issues Filed Weights   07/21/22 1645 07/22/22 0517  Weight: 86.7 kg 87.8 kg      9: Paroxysmal atrial fibrillation/flutter status post ablation -continue amiodarone 200 mg twice daily -Continue warfarin 3 mg q 1600 hrs.   10: Hospital acquired pneumonia Zosyn>>completed 7 days             -wean supplemental oxygen as able             -pulmonary hygiene   11: Hyponatremia: improved; follow-up BMP             -May eventually need tolvaptan and if persists when euvolemic per cardiology   12: Diabetes mellitus, type II: CBGs before every meal and nightly  CBG (last 3)  Recent Labs    07/21/22 2117 07/22/22 0558 07/22/22 1157  GLUCAP 121* 79 122*    -12/2 reasonable control             -continue Jardiance 10 mg daily             -Semglee 25 units every morning and 22 units nightly             -NovoLog 5 units 3 times daily with meals             -Sliding scale insulin             -mag level 2.2 13: Acute kidney injury: improved creatinine, BUN still elevated--volume mgt per cards. Daily labs 14: History of DVT: now on warfarin 15: Hypothyroidism: Continue Synthroid 16: Anemia: Status post 1 unit PRBCs; follow-up CBC             -Left epistaxis status post cauterization, resolved 17: Hyperlipidemia: Continue Lipitor 20 mg daily 18: GI prophylaxis: Continue Protonix 40 mg daily 19: Partial DNR code status: no chest compressions 20. Screening for vitamin D deficiency:   -vitamin d level in low normal range -will start him calcium and d.     LOS: 1 days A FACE TO FACE EVALUATION WAS PERFORMED  Meredith Staggers 07/22/2022, 12:19 PM

## 2022-07-22 NOTE — Progress Notes (Signed)
ANTICOAGULATION CONSULT NOTE - Follow Up Consult  Pharmacy Consult for Warfarin Indication:  LVAD and atrial fibrillation  No Known Allergies  Patient Measurements: Height: 5\' 11"  (180.3 cm) Weight: 87.8 kg (193 lb 9 oz) IBW/kg (Calculated) : 75.3  Vital Signs: Temp: 98.6 F (37 C) (12/02 0413) Temp Source: Oral (12/02 0413) BP: 97/86 (12/02 0800) Pulse Rate: 78 (12/02 0413)  Labs: Recent Labs    07/20/22 0345 07/21/22 0310 07/22/22 0313  HGB  --  9.1* 8.4*  HCT  --  28.5* 26.9*  PLT  --  629* 607*  LABPROT 20.2* 21.5* 21.3*  INR 1.7* 1.9* 1.9*  CREATININE 1.40* 1.18 1.36*    Estimated Creatinine Clearance: 62.3 mL/min (A) (by C-G formula based on SCr of 1.36 mg/dL (H)).  Assessment: 36 yom admitted with HF now s/p HM3 LVAD with LAA clip on 11/14. Was on apixaban PTA for hx Aflutter. Warfarin started on 11/17, previously managed by [facility]. Pharmacy consulted to manage warfarin dosing starting 11/30.    INR 1.9 again today, just below goal 2-2.5. Hgb 8.4, platelet count elevated.  INR 1.7 on 11/30 and 5 mg dose given > 3 mg given 12/1 with INR 1.9.  Prior plan for 30 days of post op Aspirin 81 mg daily, with plan to end on 12/14.   Goal of Therapy:  INR 2.2-.5 Monitor platelets by anticoagulation protocol: Yes   Plan:  Warfarin 4 mg x 1 today. Daily PT/INR Next CBC scheduled for 12/4. Noted prior plan to stop Aspirin 81 mg daily on 12/14.  Stop time is not yet in place.  Arty Baumgartner, RPh 07/22/2022,11:45 AM

## 2022-07-23 DIAGNOSIS — R040 Epistaxis: Secondary | ICD-10-CM

## 2022-07-23 LAB — GLUCOSE, CAPILLARY
Glucose-Capillary: 100 mg/dL — ABNORMAL HIGH (ref 70–99)
Glucose-Capillary: 130 mg/dL — ABNORMAL HIGH (ref 70–99)
Glucose-Capillary: 159 mg/dL — ABNORMAL HIGH (ref 70–99)
Glucose-Capillary: 250 mg/dL — ABNORMAL HIGH (ref 70–99)

## 2022-07-23 LAB — PROTIME-INR
INR: 2 — ABNORMAL HIGH (ref 0.8–1.2)
Prothrombin Time: 22.8 seconds — ABNORMAL HIGH (ref 11.4–15.2)

## 2022-07-23 LAB — LACTATE DEHYDROGENASE: LDH: 203 U/L — ABNORMAL HIGH (ref 98–192)

## 2022-07-23 MED ORDER — WARFARIN SODIUM 3 MG PO TABS
3.0000 mg | ORAL_TABLET | Freq: Once | ORAL | Status: AC
  Administered 2022-07-23: 3 mg via ORAL
  Filled 2022-07-23: qty 1

## 2022-07-23 MED ORDER — OXYMETAZOLINE HCL 0.05 % NA SOLN
1.0000 | Freq: Two times a day (BID) | NASAL | Status: AC
  Administered 2022-07-23 – 2022-07-24 (×3): 1 via NASAL
  Filled 2022-07-23: qty 30

## 2022-07-23 NOTE — Progress Notes (Signed)
00:00 Pt calls reporting nose bleed. Pt is having a nose bleed that he reports is not stopping. Tissues at side of bed with mild-moderate amount of blood. Gave pt some ice, instructed him to lay back, hold his nose and do not blow his nose. Also provided humidification to oxygen.   0100 Pt now reports nose has stopped bleeding. Instructed pt to call nurse if nose starts bleeding again. Pt verbalizes agreement.

## 2022-07-23 NOTE — Progress Notes (Signed)
PROGRESS NOTE   Subjective/Complaints: Pt with nose bleeding from left side of nose. Has had nose bleeds in past. Has had tissue packed in there. Refused gauze packing. Says therapy went well yesterday  ROS: Patient denies fever, rash, sore throat, blurred vision, dizziness, nausea, vomiting, diarrhea, cough, shortness of breath or chest pain, joint or back/neck pain, headache, or mood change.    Objective:   No results found. Recent Labs    07/21/22 0310 07/22/22 0313  WBC 10.7* 8.8  HGB 9.1* 8.4*  HCT 28.5* 26.9*  PLT 629* 607*   Recent Labs    07/21/22 0310 07/22/22 0313  NA 134* 133*  K 4.3 3.9  CL 90* 90*  CO2 27 30  GLUCOSE 138* 133*  BUN 33* 32*  CREATININE 1.18 1.36*  CALCIUM 9.8 9.4    Intake/Output Summary (Last 24 hours) at 07/23/2022 0808 Last data filed at 07/23/2022 0438 Gross per 24 hour  Intake --  Output 2050 ml  Net -2050 ml        Physical Exam: Vital Signs Blood pressure 107/72, pulse 82, temperature 98.7 F (37.1 C), resp. rate 14, height 5\' 11"  (1.803 m), weight 87.9 kg, SpO2 92 %.  Constitutional: No distress . Vital signs reviewed. HEENT: NCAT, EOMI, oral membranes moist. BRB from left side of nose Neck: supple Cardiovascular: RRR without murmur. No JVD    Respiratory/Chest: CTA Bilaterally without wheezes or rales. Normal effort    GI/Abdomen: BS +, non-tender, non-distended Ext: no clubbing, cyanosis, or edema Psych: pleasant and cooperative  Skin: Clean and intact without signs of breakdown Neuro:  Alert and oriented x 3. Normal insight and awareness. Intact Memory. Normal language and speech. Cranial nerve exam unremarkable. Motor 4-5/5. Sensory exam normal for light touch and pain in all 4 limbs. No limb ataxia or cerebellar signs. No abnormal tone appreciated.   Musculoskeletal: some limitations d/t LVAD but normal rom for the most part.    Assessment/Plan: 1.  Functional deficits which require 3+ hours per day of interdisciplinary therapy in a comprehensive inpatient rehab setting. Physiatrist is providing close team supervision and 24 hour management of active medical problems listed below. Physiatrist and rehab team continue to assess barriers to discharge/monitor patient progress toward functional and medical goals  Care Tool:  Bathing              Bathing assist       Upper Body Dressing/Undressing Upper body dressing        Upper body assist      Lower Body Dressing/Undressing Lower body dressing            Lower body assist       Toileting Toileting    Toileting assist       Transfers Chair/bed transfer  Transfers assist     Chair/bed transfer assist level: Minimal Assistance - Patient > 75%     Locomotion Ambulation   Ambulation assist      Assist level: Minimal Assistance - Patient > 75% Assistive device: Hand held assist Max distance: 20'   Walk 10 feet activity   Assist     Assist level: Minimal Assistance - Patient >  75% Assistive device: Hand held assist   Walk 50 feet activity   Assist Walk 50 feet with 2 turns activity did not occur: Safety/medical concerns (Unable to ambulate >20' at this time secondary to impaired endurance/activity tolerance)         Walk 150 feet activity   Assist Walk 150 feet activity did not occur: Safety/medical concerns         Walk 10 feet on uneven surface  activity   Assist     Assist level: Minimal Assistance - Patient > 75% Assistive device: Hand held assist   Wheelchair     Assist Is the patient using a wheelchair?: No   Wheelchair activity did not occur: Safety/medical concerns (Unable to perform wheelchair mobility at this time secondary to sternal precautions)         Wheelchair 50 feet with 2 turns activity    Assist    Wheelchair 50 feet with 2 turns activity did not occur: Safety/medical concerns        Wheelchair 150 feet activity     Assist  Wheelchair 150 feet activity did not occur: Safety/medical concerns       Blood pressure 107/72, pulse 82, temperature 98.7 F (37.1 C), resp. rate 14, height 5\' 11"  (1.803 m), weight 87.9 kg, SpO2 92 %.  Medical Problem List and Plan: 1. Functional deficits secondary to debility secondary to acute on chronic heart failure, NICM, cardiogenic shock status post LVAD.             -patient may shower             -ELOS/Goals: 5-7 days          --Continue CIR therapies including PT, OT  2.  Antithrombotics: -DVT/anticoagulation:  Pharmaceutical: Coumadin             -antiplatelet therapy: Aspirin 81 mg daily 3. Pain Management: Tylenol, oxycodone as needed, Lidoderm patch as needed 4. Depression: continue magnesium oxide.              -continue Zoloft 50 mg daily             -continue melatonin 3 mg nightly             -antipsychotic agents: n/a 5. Neuropsych/cognition: This patient is capable of making decisions on his own behalf. 6. Skin/Wound Care: Routine skin care checks             -patient's caregiver, [name], trained on every other day driveline dressing changes per VAD coordinator   7. Fluids/Electrolytes/Nutrition: Strict I's and O's and follow-up chemistries             -2 g sodium diet/1500 mL fluid restriction             -continue iron/vitamin supplements/mag ox             -intake is solid presently   8: Acute on chronic HFrEF/nonischemic cardiomyopathy status post LVAD             -Continue spironolactone 25 mg daily             -Continue digoxin 0.125 mg daily        -Continue torsemide 40 mg twice daily -Continue warfarin 3 mg q 1600 hrs. Continue pharmacy consult/INR -Continue sildenafil 20 mg TID -Continue Jardiance 10 mg daily -Continue magnesium oxide 400 mg twice daily -Page VAD coordinator for equipment or driveline issues Filed Weights   07/21/22 1645 07/22/22 0517 07/23/22 0500  Weight:  86.7 kg 87.8 kg  87.9 kg      9: Paroxysmal atrial fibrillation/flutter status post ablation -continue amiodarone 200 mg twice daily -Continue warfarin 3 mg q 1600 hrs.   10: Hospital acquired pneumonia Zosyn>>completed 7 days             -wean supplemental oxygen as able             -pulmonary hygiene   11: Hyponatremia: improved; follow-up BMP             -May eventually need tolvaptan and if persists when euvolemic per cardiology   12: Diabetes mellitus, type II: CBGs before every meal and nightly  CBG (last 3)  Recent Labs    07/22/22 1635 07/22/22 2118 07/23/22 0626  GLUCAP 71 261* 100*    -12/3 flucutating control, #'s tend to be higher at night             -continue Jardiance 10 mg daily             -will adjust Semglee to 28 units every morning and maintain 22 units nightly             -NovoLog 5 units 3 times daily with meals             -Sliding scale insulin             -mag level 2.2 13: Acute kidney injury: improved creatinine, BUN still elevated--volume mgt per cards. Daily labs 14: History of DVT: now on warfarin 15: Hypothyroidism: Continue Synthroid 16: Anemia: Status post 1 unit PRBCs; follow-up CBC             -Left epistaxis status post cauterization, resolved 17: Hyperlipidemia: Continue Lipitor 20 mg daily 18: GI prophylaxis: Continue Protonix 40 mg daily 19: Partial DNR code status: no chest compressions 20. Screening for vitamin D deficiency:   -vitamin d level in low normal range -started him calcium and d.  21. Epistaxis:  -needs to maintain asa/plavix   -INR is therapeutic but only at 2.0  -pack nose, give a dose of afrin  LOS: 2 days A FACE TO FACE EVALUATION WAS PERFORMED  Meredith Staggers 07/23/2022, 8:08 AM

## 2022-07-23 NOTE — Progress Notes (Signed)
ANTICOAGULATION CONSULT NOTE - Follow Up Consult  Pharmacy Consult for Warfarin Indication:  LVAD and atrial fibrillation  No Known Allergies  Patient Measurements: Height: 5\' 11"  (180.3 cm) Weight: 87.9 kg (193 lb 12.6 oz) IBW/kg (Calculated) : 75.3  Vital Signs: Temp: 98.7 F (37.1 C) (12/03 0516) BP: 110/75 (12/03 0900) Pulse Rate: 82 (12/03 0516)  Labs: Recent Labs    07/21/22 0310 07/22/22 0313 07/23/22 0439  HGB 9.1* 8.4*  --   HCT 28.5* 26.9*  --   PLT 629* 607*  --   LABPROT 21.5* 21.3* 22.8*  INR 1.9* 1.9* 2.0*  CREATININE 1.18 1.36*  --      Estimated Creatinine Clearance: 62.3 mL/min (A) (by C-G formula based on SCr of 1.36 mg/dL (H)).  Assessment: 87 yom admitted with HF now s/p HM3 LVAD with LAA clip on 11/14. Was on apixaban PTA for hx Aflutter. Warfarin started on 11/17, previously managed by [facility]. Pharmacy consulted to manage warfarin dosing starting 11/30.    INR 2.0 today after warfarin 4 mg x 1 yesterday. Right at lower limit of goal 2-2.5.  INR 1.7 on 11/30 and 5 mg dose given > 3 mg given 12/1 with INR 1.9.  Prior plan for 30 days of post op Aspirin 81 mg daily, with plan to end on 12/14.   No CBC today, but planned for am.   Noted recurrent nosebleed which has resolved. Afrin spray BID x 2 days started this am.  Goal of Therapy:  INR 2.2-.5 Monitor platelets by anticoagulation protocol: Yes   Plan:  Warfarin 3 mg x 1 today. Daily PT/INR Next CBC scheduled for 12/4. Noted prior plan to stop Aspirin 81 mg daily on 12/14.  Stop time is not yet in place. Follow up for any further nosebleeds.  Arty Baumgartner, RPh 07/23/2022,12:12 PM

## 2022-07-23 NOTE — Progress Notes (Addendum)
0500 Pt again reports a nose bleed. Call made to Dr Naaman Plummer. Orders to hold pressure and place gauze.  0510: Pt refused to allow nurse to pack nose with gauze stating "tissue works better"

## 2022-07-23 NOTE — Plan of Care (Signed)
  Problem: RH Balance Goal: LTG Patient will maintain dynamic standing with ADLs (OT) Description: LTG:  Patient will maintain dynamic standing balance with assist during activities of daily living (OT)  Flowsheets (Taken 07/23/2022 0909) LTG: Pt will maintain dynamic standing balance during ADLs with: Independent with assistive device   Problem: Sit to Stand Goal: LTG:  Patient will perform sit to stand in prep for activites of daily living with assistance level (OT) Description: LTG:  Patient will perform sit to stand in prep for activites of daily living with assistance level (OT) Flowsheets (Taken 07/23/2022 0909) LTG: PT will perform sit to stand in prep for activites of daily living with assistance level: Independent with assistive device   Problem: RH Dressing Goal: LTG Patient will perform upper body dressing (OT) Description: LTG Patient will perform upper body dressing with assist, with/without cues (OT). Flowsheets (Taken 07/23/2022 0909) LTG: Pt will perform upper body dressing with assistance level of: Independent with assistive device Goal: LTG Patient will perform lower body dressing w/assist (OT) Description: LTG: Patient will perform lower body dressing with assist, with/without cues in positioning using equipment (OT) Flowsheets (Taken 07/23/2022 0909) LTG: Pt will perform lower body dressing with assistance level of: Independent with assistive device   Problem: RH Toileting Goal: LTG Patient will perform toileting task (3/3 steps) with assistance level (OT) Description: LTG: Patient will perform toileting task (3/3 steps) with assistance level (OT)  Flowsheets (Taken 07/23/2022 0909) LTG: Pt will perform toileting task (3/3 steps) with assistance level: Independent with assistive device   Problem: RH Simple Meal Prep Goal: LTG Patient will perform simple meal prep w/assist (OT) Description: LTG: Patient will perform simple meal prep with assistance, with/without cues  (OT). Flowsheets (Taken 07/23/2022 0909) LTG: Pt will perform simple meal prep with assistance level of: Independent with assistive device   Problem: RH Laundry Goal: LTG Patient will perform laundry w/assist, cues (OT) Description: LTG: Patient will perform laundry with assistance, with/without cues (OT). Flowsheets (Taken 07/23/2022 0909) LTG: Pt will perform laundry with assistance level of: Independent with assistive device   Problem: RH Light Housekeeping Goal: LTG Patient will perform light housekeeping w/assist (OT) Description: LTG: Patient will perform light housekeeping with assistance, with/without cues (OT). Flowsheets (Taken 07/23/2022 0909) LTG: Pt will perform light housekeeping with assistance level of: Independent with assistive device   Problem: RH Toilet Transfers Goal: LTG Patient will perform toilet transfers w/assist (OT) Description: LTG: Patient will perform toilet transfers with assist, with/without cues using equipment (OT) Flowsheets (Taken 07/23/2022 0909) LTG: Pt will perform toilet transfers with assistance level of: Independent with assistive device   Problem: RH Tub/Shower Transfers Goal: LTG Patient will perform tub/shower transfers w/assist (OT) Description: LTG: Patient will perform tub/shower transfers with assist, with/without cues using equipment (OT) Flowsheets (Taken 07/23/2022 0909) LTG: Pt will perform tub/shower stall transfers with assistance level of: Independent with assistive device

## 2022-07-23 NOTE — Progress Notes (Signed)
Patient ID: Darryl Frye, male   DOB: [birth year], 61 y.o.   MRN: [id number] Advanced Heart Failure VAD Team Note  PCP-Cardiologist: Carlyle Dolly, MD   Subjective:    Slept poorly last night due to recurrent epistaxis.  Has now resolved.  He is wearing 1L oxygen by .  Oxygen saturation in 90s.   LVAD INTERROGATION:  HeartMate 3 LVAD:   Flow 4.5 liters/min, speed 5400, power 4, PI 3.2.    Objective:    Vital Signs:   Temp:  [97.8 F (36.6 C)-98.7 F (37.1 C)] 98.7 F (37.1 C) (12/03 0516) Pulse Rate:  [82-87] 82 (12/03 0516) Resp:  [14-18] 14 (12/03 0516) BP: (91-110)/(60-85) 110/75 (12/03 0900) SpO2:  [92 %-98 %] 92 % (12/03 0516) Weight:  [87.9 kg] 87.9 kg (12/03 0500) Last BM Date : 07/22/22 Mean arterial Pressure 80s-90s  Intake/Output:   Intake/Output Summary (Last 24 hours) at 07/23/2022 1030 Last data filed at 07/23/2022 1000 Gross per 24 hour  Intake --  Output 2550 ml  Net -2550 ml     Physical Exam    General: Well appearing this am. NAD.  HEENT: Normal. Neck: Supple, JVP 8 cm. Carotids OK.  Cardiac:  Mechanical heart sounds with LVAD hum present.  Lungs:  CTAB, normal effort.  Abdomen:  NT, ND, no HSM. No bruits or masses. +BS  LVAD exit site: Well-healed and incorporated. Dressing dry and intact. No erythema or drainage. Stabilization device present and accurately applied. Driveline dressing changed daily per sterile technique. Extremities:  Warm and dry. No cyanosis, clubbing, rash, or edema.  Neuro:  Alert & oriented x 3. Cranial nerves grossly intact. Moves all 4 extremities w/o difficulty. Affect pleasant     Telemetry   NSR (personally reviewed)  Labs   Basic Metabolic Panel: Recent Labs  Lab 07/18/22 0531 07/19/22 0413 07/20/22 0345 07/21/22 0310 07/22/22 0313  NA 128* 129* 133* 134* 133*  K 4.2 3.6 4.1 4.3 3.9  CL 91* 91* 92* 90* 90*  CO2 26 27 28 27 30   GLUCOSE 138* 79 140* 138* 133*  BUN 29* 26* 27* 33* 32*  CREATININE  1.28* 1.11 1.40* 1.18 1.36*  CALCIUM 8.7* 8.4* 9.2 9.8 9.4  MG  --   --  2.3  --  2.2    Liver Function Tests: Recent Labs  Lab 07/22/22 0313  AST 30  ALT 63*  ALKPHOS 223*  BILITOT 0.4  PROT 7.3  ALBUMIN 2.6*   No results for input(s): "LIPASE", "AMYLASE" in the last 168 hours. No results for input(s): "AMMONIA" in the last 168 hours.  CBC: Recent Labs  Lab 07/17/22 0445 07/18/22 0531 07/19/22 0413 07/21/22 0310 07/22/22 0313  WBC 13.2* 11.7* 9.6 10.7* 8.8  NEUTROABS  --   --   --   --  6.4  HGB 7.6* 8.3* 8.2* 9.1* 8.4*  HCT 23.1* 26.0* 25.6* 28.5* 26.9*  MCV 84.6 85.8 86.2 85.3 86.2  PLT 551* 595* 568* 629* 607*    INR: Recent Labs  Lab 07/19/22 0413 07/20/22 0345 07/21/22 0310 07/22/22 0313 07/23/22 0439  INR 1.9* 1.7* 1.9* 1.9* 2.0*    Other results: EKG:    Imaging   No results found.   Medications:     Scheduled Medications:  amiodarone  200 mg Oral BID   aspirin  81 mg Oral Daily   atorvastatin  20 mg Oral Daily   calcium-vitamin D  1 tablet Oral Q breakfast   Chlorhexidine Gluconate Cloth  6 each Topical Daily   digoxin  0.125 mg Oral Daily   empagliflozin  10 mg Oral Daily   Fe Fum-Vit C-Vit B12-FA  1 capsule Oral QPC breakfast   influenza vac split quadrivalent PF  0.5 mL Intramuscular Once   insulin aspart  0-15 Units Subcutaneous TID WC   insulin aspart  0-5 Units Subcutaneous QHS   insulin aspart  5 Units Subcutaneous TID WC   insulin glargine-yfgn  22 Units Subcutaneous QHS   insulin glargine-yfgn  25 Units Subcutaneous Daily   levothyroxine  25 mcg Oral Q0600   magnesium oxide  400 mg Oral BID   melatonin  3 mg Oral QHS   multivitamin with minerals  1 tablet Oral Daily   oxymetazoline  1 spray Each Nare BID   pantoprazole  40 mg Oral Daily   pneumococcal 20-valent conjugate vaccine  0.5 mL Intramuscular Once   polyethylene glycol  17 g Oral BID   senna-docusate  1 tablet Oral BID   sertraline  50 mg Oral Daily    sildenafil  20 mg Oral TID   sodium chloride flush  10-40 mL Intracatheter Q12H   spironolactone  25 mg Oral Daily   torsemide  40 mg Oral BID   Warfarin - Pharmacist Dosing Inpatient   Does not apply q1600    Infusions:  sodium chloride     sodium chloride      PRN Medications: sodium chloride, acetaminophen, albuterol, alum & mag hydroxide-simeth, diphenhydrAMINE, guaiFENesin-dextromethorphan, lidocaine, methocarbamol, mouth rinse, oxyCODONE, polyethylene glycol, prochlorperazine **OR** prochlorperazine **OR** prochlorperazine, sodium phosphate, sorbitol, traZODone    Assessment/Plan:    1. Acute on chronic systolic CHF/cardiogenic shock: Nonischemic cardiomyopathy, diagnosed 2020.  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  Cath in 12/20 with no significant coronary disease.  [employer] ICD. Most recent echo in 2/23 showed EF 20-25% with normal RV.  Recently, patient has been symptomatically worse, NYHA class IV with profound orthopnea. RHC showed markedly elevated filling pressures, primarily pulmonary venous hypertension, low cardiac output, and low PAPI.  Patient was admitted for inotrope and diuresis. Echo this admission with EF <20%, mod RV dysfunction.  He was started on milrinone with improvement in hemodynamics.  We proceeded with HM3 LVAD with LAA clip on 11/14.  He is now off milrinone and remains on sildenafil 20 tid.  Volume status looks ok.  No BMET today. Stable LVAD parameters.  - Continue torsemide 40 mg bid. Will need daily BMET.  - Continue sildenafil 20 tid.  - Continue 25 mg spiro daily.  - Continue digoxin 0.125 daily - Continue Jardiance 10 mg daily 2. A fib RVR, post-op: H/O S/p flutter ablation.  Remains in NSR.  - Continue po amiodarone 200 BID, decrease to daily at discharge.  -  Continue warfarin.  3. VAD management: s/p HM-3 VAD on 11/14.  Bedside echo 11/18 LV small, VAD cannula well aligned, AoV opening every beat, septum pulling to left RV moderately HK, small to moderate anterior effusion. Ramp echo 11/22 RV mild to mod HK. Speed turned to 5400. Follow LDH daily. MAP 80s generally.  - Now on warfarin with INR 2 - Continue ASA 81. - VAD interrogated personally. Parameters stable. 4. H/o DVT: On anticoagulation.  5. Acute hypoxemic respiratory failure, post-op: Resolved.  He is on 1 L oxygen  by [place].   - Continue incentive spirometry. - Encourage mobility.  - Completed 1 week of Zosyn.  - Stop oxygen, tolerate sats 90% and above.  6. AKI: Creatinine stable at 1.36 yesterday.  - Diuretic management as above - Needs daily BMET.  7. ID: Afebrile, WBCs 13. He completed initial vanc/cefepime empiric abx for ?post-op PNA. 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 CCM switched to zosyn given persistent infiltrates on imaging and he completed 1 week of Zosyn.  Now afebrile.  8. Hypothyroidism - On levothyroxine.  9. Hypokalemia - Continue spironolactone.  10. Epistaxis: s/p cauterization/packing 11/22, packing now removed.  Recurrent last night, has now stopped.  - Would try to stop supplemental oxygen.  [place] for sats 90 and above.  11. Anemia: Hgb 8.4 yesterday, would continue daily for now.   12. Hyponatremia: Na 133. Fluid restrict.  62. Deconditioning - PT/OT work in [employer]  I reviewed the LVAD parameters from today, and compared the results to the patient's prior recorded data.  No programming changes were made.  The LVAD is functioning within specified parameters.  The patient performs LVAD self-test daily.  LVAD interrogation was negative for any significant power changes, alarms or PI events/speed drops.  LVAD equipment check completed and is in good working order.  Back-up equipment present.   LVAD education done on emergency procedures and precautions and  reviewed exit site care.  Length of Stay: 2  Loralie Champagne, MD 07/23/2022, 10:30 AM  VAD Team --- VAD ISSUES ONLY--- Pager [phone] (7am - 7am)  Advanced Heart Failure Team  Pager [phone] (M-F; 7a - 5p)  Please contact [facility] for night-coverage after hours (5p -7a ) and weekends on [email or web address]

## 2022-07-23 NOTE — Evaluation (Signed)
Occupational Therapy Assessment and Plan  Patient Details  Name: Darryl Frye MRN: [id number] Date of Birth: 03-Mar-1961  OT Diagnosis: muscle weakness (generalized) Rehab Potential: Rehab Potential (ACUTE ONLY): Good ELOS: 12-14 days   Today's Date: 07/23/2022 OT Individual Time: 0092-3300 OT Individual Time Calculation (min): 75 min     Hospital Problem: Principal Problem:   Heart failure ([place]) Active Problems:   Debility   Prerenal azotemia   Past Medical History:  Past Medical History:  Diagnosis Date   Acute kidney injury ([place])    Acute respiratory failure ([place])    Atrial flutter ([place])    on Eliquis   CHF (congestive heart failure) ([place])    EF 20-25% 10/15/21, AICD in place.   Coronary artery disease    Diabetes mellitus without complication (HCC)    DVT (deep venous thrombosis) ([place]) 06/2019   Heart murmur    Hypertension    Presence of permanent cardiac pacemaker    Renal disorder    Past Surgical History:  Past Surgical History:  Procedure Laterality Date   A-FLUTTER ABLATION N/A 01/12/2022   Procedure: A-FLUTTER ABLATION;  Surgeon: Evans Lance, MD;  Location: [place] [facility];  Service: Cardiovascular;  Laterality: N/A;   COLONOSCOPY     Dr. Posey Pronto; One 4-6 mm hyperplastic rectal polyp, mid left-sided diverticulosis.   CYST REMOVAL NECK     HERNIA REPAIR     INSERTION OF IMPLANTABLE LEFT VENTRICULAR ASSIST DEVICE N/A 07/04/2022   Procedure: INSERTION OF IMPLANTABLE LEFT VENTRICULAR ASSIST DEVICE; ATRICLIP 72;  Surgeon: Neomia Glass, MD;  Location: [place];  Service: Open Heart Surgery;  Laterality: N/A;   RIGHT HEART CATH N/A 06/27/2022   Procedure: RIGHT HEART CATH;  Surgeon: Larey Dresser, MD;  Location: [place] [facility];  Service: Cardiovascular;  Laterality: N/A;   RIGHT HEART CATH N/A 06/29/2022   Procedure: RIGHT HEART CATH;  Surgeon: Larey Dresser, MD;  Location: [place] [facility];  Service: Cardiovascular;  Laterality: N/A;   TEE WITHOUT  CARDIOVERSION N/A 07/04/2022   Procedure: TRANSESOPHAGEAL ECHOCARDIOGRAM (TEE);  Surgeon: Neomia Glass, MD;  Location: [facility];  Service: Open Heart Surgery;  Laterality: N/A;   TONSILLECTOMY      Assessment & Plan Clinical Impression: Patient is a 61 year old male with a history of atrial fibrillation/flutter nonischemic cardiomyopathy who underwent right heart catheterization on 06/27/2022. He had presented with worsening symptoms, decompensated heart failure. This showed markedly elevated filling pressures, primarily pulmonary venous hypertension, low cardiac output and low PAPI. He was admitted for inotrope and diuresis. Started on milrinone and Lasix infusion. He was evaluated by Dr. Tenny Craw on 11/8 for LVAD. Eliquis was held and he was started on a heparin drip. He agreed to participate in the [facility] trial and signed the informed consent on 11/13. He underwent left ventricular assist device, TEE and left atrial appendage closure on 11/14. Admitted to ICU and critical care medicine consulted. He was able to be extubated on 11/16. Low-grade fever and leukocytosis and placed on vancomycin and cefepime. Developed A-fib with RVR responded well to amiodarone and back in normal sinus rhythm. Warfarin and aspirin started. Acute kidney injury improving. ENT consultation by Dr. Constance Holster obtained on 11/22 due to persistent leading from left nare. Left side was cauterized and packing placed. Last chest tube discontinued on 11/22. Antibiotics changed to Zosyn given persistent infiltrates on chest x-ray and sputum culture positive for capnocytophaga. Diuresis continued. O2 requirement slowly proving. Dyspnea on exertion  continued 11/24. Tolerating diet. Lasix infusion stopped 11/25. No further epistaxis and packing removed 11/27. Has PICC line right upper extremity. Foley catheter discontinued and he is voiding spontaneously. The patient completed discharge education from heart failure nursing team. The patient  requires inpatient physical medicine and rehabilitation evaluations and treatment secondary to dysfunction due to chronic congestive heart failure, cardiomyopathy, cardiogenic shock.    Patient currently requires mod A with basic self-care skills and IADL secondary to muscle weakness, decreased cardiorespiratoy endurance and decreased oxygen support, and decreased standing balance and decreased balance strategies.  Prior to hospitalization, patient could complete all aspects of A/IADL's incl driving and part time work with independent .  Patient will benefit from skilled intervention to decrease level of assist with basic self-care skills, increase independence with basic self-care skills, and increase level of independence with iADL prior to discharge home with care partner.  Anticipate patient will require intermittent supervision and follow up outpatient.  OT - End of Session Activity Tolerance: Tolerates 10 - 20 min activity with multiple rests Endurance Deficit: Yes Endurance Deficit Description: Activity tolerance/endurance deficits- Global deconditioning OT Assessment Rehab Potential (ACUTE ONLY): Good OT Barriers to Discharge: Decreased caregiver support;Weight bearing restrictions;New oxygen OT Barriers to Discharge Comments: Cardiac oand O2 needs OT Patient demonstrates impairments in the following area(s): Balance;Endurance;Motor;Skin Integrity OT Basic ADL's Functional Problem(s): Grooming;Bathing;Dressing;Toileting OT Advanced ADL's Functional Problem(s): Simple Meal Preparation;Laundry;Light Housekeeping OT Transfers Functional Problem(s): Toilet;Tub/Shower OT Additional Impairment(s): Fuctional Use of Upper Extremity OT Plan OT Intensity: Minimum of 1-2 x/day, 45 to 90 minutes OT Frequency: 5 out of 7 days OT Duration/Estimated Length of Stay: 12-14 days OT Treatment/Interventions: Balance/vestibular training;Disease mangement/prevention;Neuromuscular re-education;Self  Care/advanced ADL retraining;Therapeutic Exercise;DME/adaptive equipment instruction;Pain management;Skin care/wound managment;UE/LE Strength taining/ROM;Community reintegration;Patient/family education;UE/LE Coordination activities;Discharge planning;Functional mobility training;Psychosocial support;Therapeutic Activities OT Basic Self-Care Anticipated Outcome(s): mod I OT Toileting Anticipated Outcome(s): mod I OT Bathroom Transfers Anticipated Outcome(s): mod I OT Recommendation Recommendations for Other Services: Therapeutic Recreation consult;Neuropsych consult Therapeutic Recreation Interventions: Stress management;Kitchen group;Outing/community reintergration Patient destination: Home Follow Up Recommendations: Outpatient OT Equipment Recommended: Tub/shower bench;Tub/shower seat Equipment Details: tub bench vs shower seat depending on d/c level   OT Evaluation Precautions/Restrictions  Precautions Precautions: Fall;Sternal Precaution Booklet Issued: No Precaution Comments: LVAD, watch SpO2- 3L oxygen on inital eval, but weaned to 2L via [place] with SpO2 >90% Restrictions Weight Bearing Restrictions: Yes RUE Weight Bearing: Non weight bearing LUE Weight Bearing: Non weight bearing Other Position/Activity Restrictions: UE sternal precautions General Chart Reviewed: Yes Family/Caregiver Present: No Vital Signs Therapy Vitals Temp: 98.7 F (37.1 C) Pulse Rate: 82 Resp: 14 BP: 107/72 Patient Position (if appropriate): Lying Oxygen Therapy SpO2: 92 % O2 Device: Room Air Pain Pain Assessment Pain Scale: 0-10 Pain Score: 0-No pain Home Living/Prior Functioning Home Living Living Arrangements: Alone Available Help at Discharge: Family, Available 24 hours/day Type of Home: Apartment Home Access: Elevator Home Layout: One level Bathroom Shower/Tub: Chiropodist: Standard Bathroom Accessibility: Yes Additional Comments: has grab bar in shower  Lives  With: Alone IADL History Homemaking Responsibilities: Yes Meal Prep Responsibility: Primary Laundry Responsibility: Primary Cleaning Responsibility: Primary Bill Paying/Finance Responsibility: Primary Shopping Responsibility: Primary Current License: Yes Mode of Transportation: Car Education: HS Occupation: Part time employment Type of Occupation: vacuuming at [employer] Hobbies: church and music Prior Function Level of Independence: Independent with basic ADLs, Independent with gait, Independent with homemaking with ambulation, Independent with transfers  Able to Take Stairs?: Yes Driving: Yes Vocation: Part time employment  Vocation Requirements: 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 Leisure: Hobbies-yes (Comment) Vision Baseline Vision/History: 1 Wears glasses Ability to See in Adequate Light: 1 Impaired Patient Visual Report: No change from baseline Vision Assessment?: No apparent visual deficits Perception  Perception: Within Functional Limits Praxis Praxis: Intact Cognition Cognition Overall Cognitive Status: Within Functional Limits for tasks assessed Arousal/Alertness: Awake/alert Memory: Appears intact Awareness: Appears intact Problem Solving: Appears intact Safety/Judgment: Appears intact Brief Interview for Mental Status (BIMS) Repetition of Three Words (First Attempt): 3 Temporal Orientation: Year: Correct Temporal Orientation: Month: Accurate within 5 days Temporal Orientation: Day: Correct Recall: "Sock": Yes, no cue required Recall: "Blue": Yes, no cue required Recall: "Bed": Yes, no cue required BIMS Summary Score: 15 Sensation Sensation Light Touch: Appears Intact Hot/Cold: Appears Intact Proprioception: Appears Intact Stereognosis: Appears Intact Additional Comments: Patient reports numbness/tingling in B feeet up toward his calves- States this was present prior to  hospitalization Coordination Gross Motor Movements are Fluid and Coordinated: Yes Fine Motor Movements are Fluid and Coordinated: Yes Finger Nose Finger Test: [facility] 9 Hole Peg Test: NT Motor  Motor Motor: Other (comment) Motor - Skilled Clinical Observations: Global deconditioning  Trunk/Postural Assessment  Cervical Assessment Cervical Assessment: Within Functional Limits Thoracic Assessment Thoracic Assessment: Within Functional Limits Lumbar Assessment Lumbar Assessment: Within Functional Limits Postural Control Postural Control: Deficits on evaluation Righting Reactions: Delayed  Balance Balance Balance Assessed: Yes Static Sitting Balance Static Sitting - Balance Support: Feet supported Static Sitting - Level of Assistance: 6: Modified independent (Device/Increase time) Dynamic Sitting Balance Dynamic Sitting - Balance Support: Feet supported;During functional activity Dynamic Sitting - Level of Assistance: 5: Stand by assistance Static Standing Balance Static Standing - Balance Support: During functional activity;Right upper extremity supported Static Standing - Level of Assistance: 4: Min assist Dynamic Standing Balance Dynamic Standing - Balance Support: Right upper extremity supported;During functional activity Dynamic Standing - Level of Assistance: 4: Min assist Extremity/Trunk Assessment RUE Assessment RUE Assessment: Exceptions to [facility] General Strength Comments: grossly 4-/5 LUE Assessment LUE Assessment: Exceptions to [facility] General Strength Comments: grossly 4-/5  Care Tool Care Tool Self Care Eating   Eating Assist Level: Set up assist    Oral Care    Oral Care Assist Level: Set up assist    Bathing   Body parts bathed by patient: Right arm;Left arm;Chest;Abdomen;Front perineal area;Face;Left upper leg Body parts bathed by helper: Right lower leg;Left lower leg;Buttocks   Assist Level: Moderate Assistance - Patient 50 - 74%    Upper Body  Dressing(including orthotics)   What is the patient wearing?: Pull over shirt   Assist Level: Minimal Assistance - Patient > 75%    Lower Body Dressing (excluding footwear)   What is the patient wearing?: Pants Assist for lower body dressing: Moderate Assistance - Patient 50 - 74%    Putting on/Taking off footwear   What is the patient wearing?: Non-skid slipper socks Assist for footwear: Moderate Assistance - Patient 50 - 74%       Care Tool Toileting Toileting activity   Assist for toileting: Minimal Assistance - Patient > 75%     Care Tool Bed Mobility Roll left and right activity   Roll left and right assist level: Contact Guard/Touching assist    Sit to lying activity   Sit to lying assist level: Contact Guard/Touching assist    Lying to sitting on side of bed activity   Lying to sitting on side of bed assist level: the ability to move  from lying on the back to sitting on the side of the bed with no back support.: Contact Guard/Touching assist     Care Tool Transfers Sit to stand transfer   Sit to stand assist level: Minimal Assistance - Patient > 75%    Chair/bed transfer   Chair/bed transfer assist level: Minimal Assistance - Patient > 75%     Toilet transfer   Assist Level: Minimal Assistance - Patient > 75%     Care Tool Cognition  Expression of Ideas and Wants Expression of Ideas and Wants: 4. Without difficulty (complex and basic) - expresses complex messages without difficulty and with speech that is clear and easy to understand  Understanding Verbal and Non-Verbal Content Understanding Verbal and Non-Verbal Content: 4. Understands (complex and basic) - clear comprehension without cues or repetitions   Memory/Recall Ability Memory/Recall Ability : Current season;That he or she is in a hospital/hospital unit;Location of own room   Refer to Care Plan for [place] Term Goals  SHORT TERM GOAL WEEK 1 OT Short Term Goal 1 (Week 1): Pt will complete sink side  bathing and dressing with CGA following sternal precautions OT Short Term Goal 2 (Week 1): Pt will transfer to toilet and shower bench with close S OT Short Term Goal 3 (Week 1): Pt will identiffy 3 energy conservation strategies for ADL's OT Short Term Goal 4 (Week 1): Pt will rate am self care routine using BORG scale <12-14 overall demo improved activity tolerance  Recommendations for other services: Neuropsych and Therapeutic Recreation  Stress management and Outing/community reintegration   Skilled Therapeutic Intervention ADL   Mobility  Bed Mobility Bed Mobility: Rolling Right;Rolling Left;Supine to Sit;Sit to Supine Rolling Right: Contact Guard/Touching assist Rolling Left: Contact Guard/Touching assist Supine to Sit: Minimal Assistance - Patient > 75% Sit to Supine: Contact Guard/Touching assist Transfers Sit to Stand: Minimal Assistance - Patient > 75% Stand to Sit: Minimal Assistance - Patient > 75%  OT Treatment/Interventions: Pt seen for full initial OT evaluation and training session this am. Pt seated at EOB with tissue in L nostril and bloody tissues in bin. OT cleared with nursing ok for eval. Pt LVAD and O2 via [place]. OT introduced role of therapy and purpose of session. Pt  open to all presented assessment and training this visit. Pt requested toileting and was able to move from bed to EOB with CGA and transfer with min a to BSC and back to EOB. OT assisted and assessed ADL's, mobility, vision, sensation. cognition/lang, G/FMC, strength and balance throughout session. See above for levels. Pt with deconditioning and sternal precautions. Pt will benefit from skilled OT services at CIR to maximize function and safety with recommendation to return home with mod I for BADL's and S for higher level activity with outpt OT services upon d/c home. Pt left at end of session bed level with LE's elevated and bed alarm set, tray table and nurse call bell within reach. No changes with VSR  and nosebleed easing off.   Discharge Criteria: Patient will be discharged from OT if patient refuses treatment 3 consecutive times without medical reason, if treatment goals not met, if there is a change in medical status, if patient makes no progress towards goals or if patient is discharged from hospital.  The above assessment, treatment plan, treatment alternatives and goals were discussed and mutually agreed upon: by patient  [name] 07/23/2022, 9:14 AM

## 2022-07-24 LAB — CBC
HCT: 26 % — ABNORMAL LOW (ref 39.0–52.0)
Hemoglobin: 8.3 g/dL — ABNORMAL LOW (ref 13.0–17.0)
MCH: 27.3 pg (ref 26.0–34.0)
MCHC: 31.9 g/dL (ref 30.0–36.0)
MCV: 85.5 fL (ref 80.0–100.0)
Platelets: 616 10*3/uL — ABNORMAL HIGH (ref 150–400)
RBC: 3.04 MIL/uL — ABNORMAL LOW (ref 4.22–5.81)
RDW: 17 % — ABNORMAL HIGH (ref 11.5–15.5)
WBC: 9.9 10*3/uL (ref 4.0–10.5)
nRBC: 0 % (ref 0.0–0.2)

## 2022-07-24 LAB — BASIC METABOLIC PANEL
Anion gap: 13 (ref 5–15)
BUN: 28 mg/dL — ABNORMAL HIGH (ref 6–20)
CO2: 29 mmol/L (ref 22–32)
Calcium: 8.8 mg/dL — ABNORMAL LOW (ref 8.9–10.3)
Chloride: 91 mmol/L — ABNORMAL LOW (ref 98–111)
Creatinine, Ser: 1.35 mg/dL — ABNORMAL HIGH (ref 0.61–1.24)
GFR, Estimated: >60 mL/min (ref >60)
Glucose, Bld: 130 mg/dL — ABNORMAL HIGH (ref 70–99)
Potassium: 3.9 mmol/L (ref 3.5–5.1)
Sodium: 133 mmol/L — ABNORMAL LOW (ref 135–145)

## 2022-07-24 LAB — PROTIME-INR
INR: 2 — ABNORMAL HIGH (ref 0.8–1.2)
Prothrombin Time: 22.7 seconds — ABNORMAL HIGH (ref 11.4–15.2)

## 2022-07-24 LAB — GLUCOSE, CAPILLARY
Glucose-Capillary: 128 mg/dL — ABNORMAL HIGH (ref 70–99)
Glucose-Capillary: 100 mg/dL — ABNORMAL HIGH (ref 70–99)
Glucose-Capillary: 117 mg/dL — ABNORMAL HIGH (ref 70–99)

## 2022-07-24 LAB — LACTATE DEHYDROGENASE: LDH: 197 U/L — ABNORMAL HIGH (ref 98–192)

## 2022-07-24 MED ORDER — WARFARIN SODIUM 4 MG PO TABS
4.0000 mg | ORAL_TABLET | Freq: Once | ORAL | Status: AC
  Administered 2022-07-24: 4 mg via ORAL
  Filled 2022-07-24: qty 1

## 2022-07-24 NOTE — IPOC Note (Signed)
Overall Plan of Care [facility]) Patient Details Name: Darryl Frye MRN: [id number] DOB: 1961/08/10  Admitting Diagnosis: Heart failure [facility])  Hospital Problems: Principal Problem:   Heart failure ([place]) Active Problems:   Debility   Prerenal azotemia     Functional Problem List: Nursing Pain, Bladder, Bowel, Perception, Safety, Edema, Endurance, Skin Integrity, Medication Management, Nutrition, Other (comment) (LVAD)  PT Balance, Safety, Edema, Sensory, Endurance, Motor, Skin Integrity  OT Balance, Endurance, Motor, Skin Integrity  SLP    TR         Basic ADL's: OT Grooming, Bathing, Dressing, Toileting     Advanced  ADL's: OT Simple Meal Preparation, Laundry, Light Housekeeping     Transfers: PT Bed Mobility, Bed to Chair, Teacher, early years/pre, Tub/Shower     Locomotion: PT Ambulation, Stairs, Wheelchair Mobility     Additional Impairments: OT Fuctional Use of Upper Extremity  SLP        TR      Anticipated Outcomes Item Anticipated Outcome  Self Feeding    Swallowing      Basic self-care  mod I  Toileting  mod I   Bathroom Transfers mod I  Bowel/Bladder  continent x 2  Transfers  ModI with LRAD  Locomotion  ModI with LRAD  Communication     Cognition     Pain  less than 3  Safety/Judgment  remain fall free while in rehab   Therapy Plan: PT Intensity: Minimum of 1-2 x/day ,45 to 90 minutes PT Frequency: 5 out of 7 days PT Duration Estimated Length of Stay: 10-14 days OT Intensity: Minimum of 1-2 x/day, 45 to 90 minutes OT Frequency: 5 out of 7 days OT Duration/Estimated Length of Stay: 12-14 days     Team Interventions: Nursing Interventions Patient/Family Education, Disease Management/Prevention, Skin Care/Wound Management, Discharge Planning, Bladder Management, Pain Management, Psychosocial Support, Bowel Management, Medication Management  PT interventions Ambulation/gait training, Community reintegration, DME/adaptive equipment instruction,  Neuromuscular re-education, Psychosocial support, Stair training, UE/LE Strength taining/ROM, Training and development officer, Discharge planning, Pain management, Skin care/wound management, Therapeutic Activities, UE/LE Coordination activities, Cognitive remediation/compensation, Disease management/prevention, Functional mobility training, Patient/family education, Therapeutic Exercise  OT Interventions Balance/vestibular training, Disease mangement/prevention, Neuromuscular re-education, Self Care/advanced ADL retraining, Therapeutic Exercise, DME/adaptive equipment instruction, Pain management, Skin care/wound managment, UE/LE Strength taining/ROM, Community reintegration, Barrister's clerk education, UE/LE Coordination activities, Discharge planning, Functional mobility training, Psychosocial support, Therapeutic Activities  SLP Interventions    TR Interventions    SW/CM Interventions Discharge Planning, Psychosocial Support, Patient/Family Education   Barriers to Discharge MD  Medical stability  Nursing Decreased caregiver support, Wound Care, Lack of/limited family support, Weight bearing restrictions, Medication compliance, Behavior, Other (comments) Home alone; appartment with elevator; level entry  PT Insurance for SNF coverage, Weight bearing restrictions, Wound Care    OT Decreased caregiver support, Weight bearing restrictions, New oxygen Cardiac oand O2 needs  SLP      SW Insurance for SNF coverage, Decreased caregiver support     Team Discharge Planning: Destination: PT-Home ,OT- Home , SLP-  Projected Follow-up: PT-Outpatient PT, OT-  Outpatient OT, SLP-  Projected Equipment Needs: PT-To be determined, OT- Tub/shower bench, Tub/shower seat, SLP-  Equipment Details: PT-TBD, OT-tub bench vs shower seat depending on d/c level Patient/family involved in discharge planning: PT- Patient,  OT-Patient, SLP-   MD ELOS: 5 days Medical Rehab Prognosis:  Excellent Assessment: The patient has  been admitted for CIR therapies with the diagnosis of cardiac debility. The team will be addressing functional mobility,  strength, stamina, balance, safety, adaptive techniques and equipment, self-care, bowel and bladder mgt, patient and caregiver education. Goals have been set at [place]. Anticipated discharge destination is home.        See Team Conference Notes for weekly updates to the plan of care

## 2022-07-24 NOTE — Progress Notes (Signed)
Physical Therapy Session Note  Patient Details  Name: Darryl Frye MRN: [id number] Date of Birth: 1961-05-21  Today's Date: 07/24/2022 PT Individual Time: 7482-7078 PT Individual Time Calculation (min): 70 min   Short Term Goals: Week 1:  PT Short Term Goal 1 (Week 1): Patient will ambulate >50' with LRAD and CGA PT Short Term Goal 2 (Week 1): Patient will ascend/descend x4 steps with CGA PT Short Term Goal 3 (Week 1): Patient will continue to demonstrate proper understanding of HM3/Batteries  Skilled Therapeutic Interventions/Progress Updates:      Therapy Documentation Precautions:  Precautions Precautions: Fall, Sternal Precaution Booklet Issued: No Precaution Comments: LVAD, watch SpO2- 3L oxygen on inital eval, but weaned to 2L via [place] with SpO2 >90% Restrictions Weight Bearing Restrictions: Yes RUE Weight Bearing: Non weight bearing LUE Weight Bearing: Non weight bearing Other Position/Activity Restrictions: UE sternal precautions  Pt agreeable to PT session with emphasis on gait training and improving LE activity tolerance. Pt SPO2 90 or greater on RA.  Pt declines pain and requires (S) for safety with transition of LVAD from wall to batteries.  Pt (S) for w/c propulsion ~100 ft to dayroom with BUE's. Pt CGA with STS and gait 60 ft + 111 ft with rollator with seated rest break in between each bout. Pt able to verbalize and appropriately utilize rollator in session. Pt performed NuStep 1 min x 3 at level 4 with BLE's to increase activity tolerance. Pt performed stand pivot from NuStep > w/c and with posterior loss of balance and able to self-correct with CGA. PT educated pt regarding energy conservation, AD use and d/c planning. PT recommends OP cardiac rehab and likely rollator for discharge and pt agrees. Pt (S) for w/c propulsion from day room to room and left in bed with all needs in reach and alarm on.     Therapy/Group: Individual Therapy  Verl Dicker Verl Dicker PT, DPT  07/24/2022, 7:49 AM

## 2022-07-24 NOTE — Progress Notes (Signed)
Met with patient. Very tired. Was able to get him to verify that does take insulin at home and that has glucometer with lancets. Patient closed his eyes and went back to sleep. Will try and reach out another time. Nurse reports that patient able to check his LVAD daily and still working on exchanging from machine to batteries.

## 2022-07-24 NOTE — Progress Notes (Signed)
Occupational Therapy Session Note  Patient Details  Name: Darryl Frye MRN: [id number] Date of Birth: Sep 03, 1960  Today's Date: 07/24/2022 OT Individual Time: 0800-0900 1st Session; 1300-1415 2nd Session  OT Individual Time Calculation (min): 60 min, 75 min    Short Term Goals: Week 1:  OT Short Term Goal 1 (Week 1): Pt will complete sink side bathing and dressing with CGA following sternal precautions OT Short Term Goal 2 (Week 1): Pt will transfer to toilet and shower bench with close S OT Short Term Goal 3 (Week 1): Pt will identiffy 3 energy conservation strategies for ADL's OT Short Term Goal 4 (Week 1): Pt will rate am self care routine using BORG scale <12-14 overall demo improved activity tolerance  Skilled Therapeutic Interventions/Progress Updates:  1st Session:  Pt seeing cardiac NP upon OT arrival. Clarified to pt and confirmed by NP pt cannot shower while in rehab until drive lines close. HR 83 and SpO2 on RA 94% RR 18.  Nosebleed ceased. Pt agreeable to sponge bath sink side following commode use bedside. Pt able to transfer to [facility] with CGA and min A for clothing and peri hygiene. Transfer to w/c with CGA and moved by OT with LVAD lines to sink side. OT performed hair washing for pt with hair tray and modified techniques while upright. Pt able to complete UB selfcare with rests, set up and CGA, LB with min A as well as pull on pants. Mod A for slipper socks. Cues for frequent rest breaks, and breathing integration. Left pt up in w/c for next session with chair exit alarm set, nursing call button and all needs in reach. HR 88 RR 18 SpO2 94%.   2nd Session:   Pt open to all therapy this session. Doppler HR matched pulse ox HR 84 and maintained throughout session with SpO2 sats between 92-98% on RA. LVAD lines switched to battery packs by pt with no cues needed and pt able to recall need for back up batteries. Pt transported with rollator by OT via w/c for energy conservation. BORG  scale training to ensure pt able to rate himself throughout the course of therapy. Pt confirms he will d/c to his ex wife's home initially with 3 steps to enter then all on 1 level. Conveyed info to PT. Pt able to complete 2 min x 2 sets of standing on [employer] foam trainer at sink of demo apt kitchen with seated rests, BORG 14 (somewhat hard) and reports of just feeling fatigues and lines and LVAD being "heavy". Pt able to perform UE reach for light items up to 4-5" obos with unilateral counter support. Prolonged rests betwee and VS monitoring closely as well as battery checks. Pt then able to amb with rollator back into room from hallway at end of session with CGA to EOB. Voided and OT added to Flowsheets for I&O monitoring. Left pt bed level wth bed alarm set, needs and call button in reach.   Therapy Documentation Precautions:  Precautions Precautions: Fall, Sternal Precaution Booklet Issued: No Precaution Comments: LVAD, watch SpO2- 3L oxygen on inital eval, but weaned to 2L via [place] with SpO2 >90% Restrictions Weight Bearing Restrictions: Yes RUE Weight Bearing: Non weight bearing LUE Weight Bearing: Non weight bearing Other Position/Activity Restrictions: UE sternal precautions    Therapy/Group: Individual Therapy  [name] 07/24/2022, 10:41 AM

## 2022-07-24 NOTE — Progress Notes (Signed)
ANTICOAGULATION CONSULT NOTE - Follow Up Consult  Pharmacy Consult for Warfarin Indication:  LVAD and atrial fibrillation  No Known Allergies  Patient Measurements: Height: 5\' 11"  (180.3 cm) Weight: 86.6 kg (190 lb 14.7 oz) IBW/kg (Calculated) : 75.3  Vital Signs: Temp: 98.6 F (37 C) (12/04 0420) Temp Source: Oral (12/04 0000) BP: 102/68 (12/04 0800) Pulse Rate: 83 (12/04 0420)  Labs: Recent Labs    07/22/22 0313 07/23/22 0439 07/24/22 0430  HGB 8.4*  --  8.3*  HCT 26.9*  --  26.0*  PLT 607*  --  616*  LABPROT 21.3* 22.8* 22.7*  INR 1.9* 2.0* 2.0*  CREATININE 1.36*  --  1.35*     Estimated Creatinine Clearance: 62.8 mL/min (A) (by C-G formula based on SCr of 1.35 mg/dL (H)).  Assessment: 87 yom admitted with HF now s/p HM3 LVAD with LAA clip on 11/14. Was on apixaban PTA for hx Aflutter. Warfarin started on 11/17, previously managed by TCTS. Pharmacy consulted to manage warfarin dosing starting 11/30.   INR remains therapeutic at 2, CBC and LDH stable.  Goal of Therapy:  INR 2.2-.5 Monitor platelets by anticoagulation protocol: Yes   Plan:  -Warfarin 4mg  PO x1 -Daily INR  [name], PharmD, BCPS, [facility] Clinical Pharmacist [phone] Please check AMION for all [facility] numbers 07/24/2022

## 2022-07-24 NOTE — Progress Notes (Signed)
Patient resting in bed. Denies pain or discomfort throughout the shift. LVAD running without difficulty. Patient continues safety check without difficulty.

## 2022-07-24 NOTE — Progress Notes (Signed)
PROGRESS NOTE   Subjective/Complaints: Feels weak and creatinine is increased, will check with heart failure team whether fluid restriction can be liberalized to 1898mL, patient feels thirsty  ROS: Patient denies fever, rash, sore throat, blurred vision, dizziness, nausea, vomiting, diarrhea, cough, shortness of breath or chest pain, joint or back/neck pain, headache, or mood change. +fatigue   Objective:   No results found. Recent Labs    07/22/22 0313 07/24/22 0430  WBC 8.8 9.9  HGB 8.4* 8.3*  HCT 26.9* 26.0*  PLT 607* 616*   Recent Labs    07/22/22 0313 07/24/22 0430  NA 133* 133*  K 3.9 3.9  CL 90* 91*  CO2 30 29  GLUCOSE 133* 130*  BUN 32* 28*  CREATININE 1.36* 1.35*  CALCIUM 9.4 8.8*    Intake/Output Summary (Last 24 hours) at 07/24/2022 1654 Last data filed at 07/24/2022 1400 Gross per 24 hour  Intake 1218 ml  Output 1800 ml  Net -582 ml        Physical Exam: Vital Signs Blood pressure 128/84, pulse 83, temperature 98.6 F (37 C), resp. rate 18, height 5\' 11"  (1.803 m), weight 87.4 kg, SpO2 94 %.  Gen: no distress, normal appearing HEENT: oral mucosa pink and moist, NCAT Cardio: Reg rate Chest: normal effort, normal rate of breathing Abd: soft, non-distended Ext: no edema Psych: pleasant, normal affect Skin: Clean and intact without signs of breakdown Neuro:  Alert and oriented x 3. Normal insight and awareness. Intact Memory. Normal language and speech. Cranial nerve exam unremarkable. Motor 4-5/5. Sensory exam normal for light touch and pain in all 4 limbs. No limb ataxia or cerebellar signs. No abnormal tone appreciated.   Musculoskeletal: some limitations d/t LVAD but normal rom for the most part.    Assessment/Plan: 1. Functional deficits which require 3+ hours per day of interdisciplinary therapy in a comprehensive inpatient rehab setting. Physiatrist is providing close team supervision  and 24 hour management of active medical problems listed below. Physiatrist and rehab team continue to assess barriers to discharge/monitor patient progress toward functional and medical goals  Care Tool:  Bathing    Body parts bathed by patient: Right arm, Left arm, Chest, Abdomen, Front perineal area, Face, Left upper leg   Body parts bathed by helper: Right lower leg, Left lower leg, Buttocks     Bathing assist Assist Level: Moderate Assistance - Patient 50 - 74%     Upper Body Dressing/Undressing Upper body dressing   What is the patient wearing?: Pull over shirt    Upper body assist Assist Level: Minimal Assistance - Patient > 75%    Lower Body Dressing/Undressing Lower body dressing      What is the patient wearing?: Pants     Lower body assist Assist for lower body dressing: Moderate Assistance - Patient 50 - 74%     Toileting Toileting    Toileting assist Assist for toileting: Minimal Assistance - Patient > 75%     Transfers Chair/bed transfer  Transfers assist     Chair/bed transfer assist level: Minimal Assistance - Patient > 75%     Locomotion Ambulation   Ambulation assist  Assist level: Minimal Assistance - Patient > 75% Assistive device: Hand held assist Max distance: 20'   Walk 10 feet activity   Assist     Assist level: Minimal Assistance - Patient > 75% Assistive device: Hand held assist   Walk 50 feet activity   Assist Walk 50 feet with 2 turns activity did not occur: Safety/medical concerns (Unable to ambulate >20' at this time secondary to impaired endurance/activity tolerance)         Walk 150 feet activity   Assist Walk 150 feet activity did not occur: Safety/medical concerns         Walk 10 feet on uneven surface  activity   Assist     Assist level: Minimal Assistance - Patient > 75% Assistive device: Hand held assist   Wheelchair     Assist Is the patient using a wheelchair?: Yes Type of  Wheelchair: Manual Wheelchair activity did not occur: Safety/medical concerns (Unable to perform wheelchair mobility at this time secondary to sternal precautions)         Wheelchair 50 feet with 2 turns activity    Assist    Wheelchair 50 feet with 2 turns activity did not occur: Safety/medical concerns       Wheelchair 150 feet activity     Assist  Wheelchair 150 feet activity did not occur: Safety/medical concerns       Blood pressure 128/84, pulse 83, temperature 98.6 F (37 C), resp. rate 18, height 5\' 11"  (1.803 m), weight 87.4 kg, SpO2 94 %.  Medical Problem List and Plan: 1. Functional deficits secondary to debility secondary to acute on chronic heart failure, NICM, cardiogenic shock status post LVAD.             -patient may shower             -ELOS/Goals: 5-7 days          Continue CIR therapies including PT, OT  2.  Antithrombotics: -DVT/anticoagulation:  Pharmaceutical: Coumadin             -antiplatelet therapy: Aspirin 81 mg daily 3. Pain Management: Tylenol, oxycodone as needed, Lidoderm patch as needed 4. Depression: continue magnesium oxide.              -continue Zoloft 50 mg daily             -continue melatonin 3 mg nightly             -antipsychotic agents: n/a 5. Neuropsych/cognition: This patient is capable of making decisions on his own behalf. 6. Skin/Wound Care: Routine skin care checks             -patient's caregiver, [name], trained on every other day driveline dressing changes per VAD coordinator   7. Fluids/Electrolytes/Nutrition: Strict I's and O's and follow-up chemistries             -2 g sodium diet/1500 mL fluid restriction             -continue iron/vitamin supplements/mag ox             -intake is solid presently   8: Acute on chronic HFrEF/nonischemic cardiomyopathy status post LVAD             -Continue spironolactone 25 mg daily             -Continue digoxin 0.125 mg daily        -Continue torsemide 40 mg twice  daily -Continue warfarin 3 mg q 1600 hrs. Continue  pharmacy consult/INR -Continue sildenafil 20 mg TID -Continue Jardiance 10 mg daily -Continue magnesium oxide 400 mg twice daily -Page VAD coordinator for equipment or driveline issues Filed Weights   07/23/22 0500 07/24/22 0500 07/24/22 1632  Weight: 87.9 kg 86.6 kg 87.4 kg      9: Paroxysmal atrial fibrillation/flutter status post ablation -continue amiodarone 200 mg twice daily -Continue warfarin 3 mg q 1600 hrs.   10: Hospital acquired pneumonia Zosyn>>completed 7 days             -wean supplemental oxygen as able             -pulmonary hygiene   11: Hyponatremia: improved; follow-up BMP             -May eventually need tolvaptan and if persists when euvolemic per cardiology   12: Diabetes mellitus, type II: CBGs before every meal and nightly  CBG (last 3)  Recent Labs    07/24/22 0604 07/24/22 1140 07/24/22 1614  GLUCAP 128* 100* 117*    -12/3 flucutating control, #'s tend to be higher at night             -continue Jardiance 10 mg daily             -will adjust Semglee to 28 units every morning and maintain 22 units nightly             -NovoLog 5 units 3 times daily with meals             -Sliding scale insulin             -mag level 2.2 13: Acute kidney injury: creatinine worsening, will check with heart failure team if we can liberalize his fluid restriction to 181mL.  14: History of DVT: now on warfarin 15: Hypothyroidism: Continue Synthroid 16: Anemia: Status post 1 unit PRBCs; follow-up CBC             -Left epistaxis status post cauterization, resolved 17: Hyperlipidemia: Continue Lipitor 20 mg daily 18: GI prophylaxis: Continue Protonix 40 mg daily 19: Partial DNR code status: no chest compressions 20. Screening for vitamin D deficiency:   -vitamin d level in low normal range -started vitamin D supplement 21. Epistaxis:  -needs to maintain asa/plavix   -INR is therapeutic but only at 2.0  -pack nose,  give a dose of afrin 22. Hypocalcemia: calcium supplement started  LOS: 3 days A FACE TO FACE EVALUATION WAS PERFORMED  Clide Deutscher Korah Hufstedler 07/24/2022, 4:54 PM

## 2022-07-24 NOTE — Progress Notes (Signed)
[facility] Individual Statement of Services  Patient Name:  Darryl Frye  Date:  07/24/2022  Welcome to the [facility].  Our goal is to provide you with an individualized program based on your diagnosis and situation, designed to meet your specific needs.  With this comprehensive rehabilitation program, you will be expected to participate in at least 3 hours of rehabilitation therapies Monday-Friday, with modified therapy programming on the weekends.  Your rehabilitation program will include the following services:  Physical Therapy (PT), Occupational Therapy (OT), 24 hour per day rehabilitation nursing, Therapeutic Recreaction (TR), Neuropsychology, Care Coordinator, Rehabilitation Medicine, Nutrition Services, and Pharmacy Services  Weekly team conferences will be held on Tuesday to discuss your progress.  Your Inpatient Rehabilitation Care Coordinator will talk with you frequently to get your input and to update you on team discussions.  Team conferences with you and your family in attendance may also be held.  Expected length of stay:  12-14 days  Overall anticipated outcome:   Depending on your progress and recovery, your program may change. Your Inpatient Rehabilitation Care Coordinator will coordinate services and will keep you informed of any changes. Your Inpatient Rehabilitation Care Coordinator's name and contact numbers are listed  below.  The following services may also be recommended but are not provided by the [facility] will be made to provide these services after discharge if needed.  Arrangements include referral to agencies that provide these services.  Your insurance has been verified to be:  UHC-Medicare & Medicaid Your primary doctor is:  Water quality scientist  Pertinent  information will be shared with your doctor and your insurance company.  Inpatient Rehabilitation Care Coordinator:  Ovidio Kin, [place] or Emilia Beck  Information discussed with and copy given to patient by: Elease Hashimoto, 07/24/2022, 9:50 AM

## 2022-07-24 NOTE — Progress Notes (Signed)
Inpatient Rehabilitation Care Coordinator Assessment and Plan Patient Details  Name: Darryl Frye MRN: [id number] Date of Birth: 05-01-1961  Today's Date: 07/24/2022  Hospital Problems: Principal Problem:   Heart failure [facility]) Active Problems:   Debility   Prerenal azotemia  Past Medical History:  Past Medical History:  Diagnosis Date   Acute kidney injury ([place])    Acute respiratory failure ([place])    Atrial flutter ([place])    on Eliquis   CHF (congestive heart failure) ([place])    EF 20-25% 10/15/21, AICD in place.   Coronary artery disease    Diabetes mellitus without complication (HCC)    DVT (deep venous thrombosis) ([place]) 06/2019   Heart murmur    Hypertension    Presence of permanent cardiac pacemaker    Renal disorder    Past Surgical History:  Past Surgical History:  Procedure Laterality Date   A-FLUTTER ABLATION N/A 01/12/2022   Procedure: A-FLUTTER ABLATION;  Surgeon: Evans Lance, MD;  Location: [place] CV [facility];  Service: Cardiovascular;  Laterality: N/A;   COLONOSCOPY     Dr. Posey Pronto; One 4-6 mm hyperplastic rectal polyp, mid left-sided diverticulosis.   CYST REMOVAL NECK     HERNIA REPAIR     INSERTION OF IMPLANTABLE LEFT VENTRICULAR ASSIST DEVICE N/A 07/04/2022   Procedure: INSERTION OF IMPLANTABLE LEFT VENTRICULAR ASSIST DEVICE; ATRICLIP 43;  Surgeon: Neomia Glass, MD;  Location: [facility];  Service: Open Heart Surgery;  Laterality: N/A;   RIGHT HEART CATH N/A 06/27/2022   Procedure: RIGHT HEART CATH;  Surgeon: Larey Dresser, MD;  Location: [place] [facility];  Service: Cardiovascular;  Laterality: N/A;   RIGHT HEART CATH N/A 06/29/2022   Procedure: RIGHT HEART CATH;  Surgeon: Larey Dresser, MD;  Location: [place] [facility];  Service: Cardiovascular;  Laterality: N/A;   TEE WITHOUT CARDIOVERSION N/A 07/04/2022   Procedure: TRANSESOPHAGEAL ECHOCARDIOGRAM (TEE);  Surgeon: Neomia Glass, MD;  Location: [place];  Service: Open Heart Surgery;  Laterality: N/A;    TONSILLECTOMY     Social History:  reports that he quit smoking about 3 years ago. His smoking use included cigarettes. He has a 20.00 pack-year smoking history. He has never used smokeless tobacco. He reports that he does not currently use alcohol. He reports that he does not currently use drugs after having used the following drugs: "Crack" cocaine.  Family / Support Systems Marital Status: Divorced How [place]?: 4 years separted 18 years prior to divorce Patient Roles: Other (Comment), Parent (friend) Children: Loretha Stapler [phone] Other Supports: Jessica-ex-wife [phone]  Edward-friend Anticipated Caregiver: Ex-wife and daughter along with friend Ability/Limitations of Caregiver: pt will be alone with those coming in to check on him and making sure needs are met Caregiver Availability: Intermittent Family Dynamics: Close with ex-wife who is is his best friend according to him. He has a daughter and friends who will be checking also. Pt will need to be mod/i at discharge.  Social History Preferred language: English Religion: Baptist Cultural Background: No issues Education: HS Health Literacy - How often do you need to have someone help you when you read instructions, pamphlets, or other written material from your doctor or pharmacy?: Never Writes: Yes Employment Status: Disabled Date Retired/Disabled/Unemployed: 2020 Public relations account executive Issues: No issues Guardian/Conservator: None-according to MD pt is capable of making his own decisions while here.   Abuse/Neglect Abuse/Neglect Assessment Can Be Completed: Yes Physical Abuse: Denies Verbal Abuse: Denies Sexual Abuse: Denies Exploitation of patient/patient's resources: Denies  Self-Neglect: Denies  Patient response to: Social Isolation - How often do you feel lonely or isolated from those around you?: Never  Emotional Status Pt's affect, behavior and adjustment status: Pt is motivated to regain his  independence and get back home. He has learned how to do the LVAD along with his ex-wife. He feels with how he is doing he will be a short lenght of stay we'll see Recent Psychosocial Issues: other health issues-were beng managed Psychiatric History: no history seems to be coping appropriately and adjusting to new norm. Will see if can benefit from seeing neruo-psych while here Substance Abuse History: history of ETOH and cocaine quit 2020  Patient / Family Perceptions, Expectations & Goals Pt/Family understanding of illness & functional limitations: Pt is able to explain his LVAD and issues regarding this. He has his ex-wife has been educated regarding this and pt feels comfortable with it Premorbid pt/family roles/activities: father, freind, ex-husband, retiree, etc Anticipated changes in roles/activities/participation: resume Pt/family expectations/goals: Pt states: " I hope to be independent when I leave here I need to get stronger while here."  [employer]: None Premorbid Home Care/DME Agencies: None Transportation available at discharge: self and now will rely upon others until he can drive again Is the patient able to respond to transportation needs?: Yes In the past 12 months, has lack of transportation kept you from medical appointments or from getting medications?: No In the past 12 months, has lack of transportation kept you from meetings, work, or from getting things needed for daily living?: No Resource referrals recommended: Neuropsychology  Discharge Planning Living Arrangements: Alone Support Systems: Children, Friends/neighbors Type of Residence: Private residence Insurance Resources: Multimedia programmer (specify), Medicaid (specify county) Primary school teacher) Museum/gallery curator Resources: [employer] Financial Screen Referred: No Living Expenses: Education officer, community Management: Patient Does the patient have any problems obtaining your medications?: No Home Management:  self Patient/Family Preliminary Plans: Return home alone with intermittent assist from ex-wife, daughter and freinds. He hopefully can reach mod/i level and be safe home alone he is fairly high level currently and needs to build back his strength. Care Coordinator Barriers to Discharge: Insurance for SNF coverage, Decreased caregiver support Care Coordinator Anticipated Follow Up Needs: HH/OP  Clinical Impression Pleasant gentleman who is motivated to do well and regain his independence. He has good supports via ex-wife, daughter and friends. Will work on discharge needs and any equipment needs.  Elease Hashimoto 07/24/2022, 9:48 AM

## 2022-07-24 NOTE — Progress Notes (Signed)
Inpatient Rehabilitation  Patient information reviewed and entered into eRehab system by Traeton Bordas Dudley Cooley, OTR/L, Rehab Quality Coordinator.   Information including medical coding, functional ability and quality indicators will be reviewed and updated through discharge.   

## 2022-07-24 NOTE — Progress Notes (Signed)
LVAD Coordinator Rounding Note:  HM 3 LVAD implanted on 07/04/22 by Dr Tenny Craw under destination criteria due to uncontrolled diabetes. Left atrial appendage clipped at time of surgery.  Pt sitting up in chair participating in OT ADL session this morning. States he feels much better. He had a nosebleed again over the weekend; resolved with Afrin and left nare nasal packing.   VAD discharge teaching completed with pt and caregiver. See separate note for documentation. Home equipment has been ordered.   ICD therapy back on. pt has decreased sensing now that VAD has been placed.   Vital signs: Temp: 98.6 HR: 83 NSR Doppler Pressure: 102 Arterial BP: 102/68 (84) O2 Sat: 93% RA Wt: 214.3>208.8>209.2>207.2>206.6>205.3>200>197.1>195.9>198.4>197.1>189.3>190.4>190.9 lb  LVAD interrogation reveals:  Speed: 5400 Flow: 4.5 Power: 4.1 w PI: 3.6   Alarms: none Events: 5-10 PI daily Hematocrit: 28  Fixed speed: 5400 Low speed limit: 5100   Drive Line:  Dressing change performed by VAD coordinator. Existing VAD dressing removed and site care performed using sterile technique. Drive line exit site cleaned with Chlora prep applicators x 2, allowed to dry, and gauze dressing with silver strip applied. Exit site partially incorporated, the velour is fully implanted at exit site. 1 blue sutures, and 1 black suture in place. Slight bruising at exit site. Scant amount of dried serous drainage on previous dressing/silver strip. No redness, tenderness, or foul odor. Drive line anchor re-applied. Will advance dressing changes to twice weekly. Next dressing change 07/27/22 to be performed by nurse champion, [place] coordinator, or trained caregiver-Jessica.  Labs:  LDH trend: 326>400>462>422>387>388>368>354>243>245>228>227>247>197  INR trend: 1.1>1.5>1.4>1.5>3.0>2.4>2.2>2.2>2.2>1.9>1.9>1.7>1.9>2.0  WBC trend: 12.6>18>20.9>17.4>11.5>11.0>15.1>16.4>13.2>11.7>9.6>10.7>9.9  Anticoagulation Plan: -INR Goal: 2.0 -  2.5 -ASA Dose: 81mg    Blood Products:  IntraOp 11/14:  - 4 FFP  - DDAVP - 617 cell saver  PostOp 11/14: - 2 FFP  07/17/22: - 1 PRBC  Device: -Medtronic -Therapies: on 07/20/22 VF > 214 BPM--ATP during charging VT > OFF  Arrythmias: AFIB RVR 11/17. Amiodarone transitioned to p.o. 07/12/22  Respiratory: Extubated 07/06/22  Renal:  -BUN:25>18>20>26>27>25>17>25>29>26>27>28  -CRT:1.28>1.23>1.44>1.46>1.47>1.27>1.1>1.34>1.28>1.11>1.4>1.18>1.35  Infection: 07/07/22>> blood cultures>> no growth 5 days; final 07/07/22>> expectorated sputum>>moderate capnocutophaga; final (On Zosyn x 1 week)    Pt Education: Completed discharge education with [name] and [name]. See separate note for documentation.  [name] checked off to perform dressing changes independently.                                            Plan/Recommendations:  1. Page VAD coordinator for equipment or drive line issues. 2. Advance to twice weekly drive line dressing changes per VAD coordinator, nurse champion, or trained caregiver-Jessica.  Emerson Monte RN [place] Coordinator  Office: [phone]  24/7 Pager: [phone]

## 2022-07-24 NOTE — Discharge Summary (Signed)
Physician Discharge Summary  Patient ID: Darryl Frye MRN: [id number] DOB/AGE: June 19, 1961 61 y.o.  Admit date: 07/21/2022 Discharge date: 07/31/2022  Discharge Diagnoses:  Principal Problem:   Heart failure ([place]) Active Problems:   Debility   Prerenal azotemia Heart failure Non-ischemic cardiomyopathy Atrial fibrillation Hyponatremia DM Hypothyroidism Hyperlipidemia Hypocalcemia Anemia AKI    Discharged Condition: stable  Significant Diagnostic Studies:  Labs:  Basic Metabolic Panel: Recent Labs  Lab 07/18/22 0531 07/19/22 0413 07/20/22 0345 07/21/22 0310 07/22/22 0313 07/24/22 0430  NA 128* 129* 133* 134* 133* 133*  K 4.2 3.6 4.1 4.3 3.9 3.9  CL 91* 91* 92* 90* 90* 91*  CO2 26 27 28 27 30 29   GLUCOSE 138* 79 140* 138* 133* 130*  BUN 29* 26* 27* 33* 32* 28*  CREATININE 1.28* 1.11 1.40* 1.18 1.36* 1.35*  CALCIUM 8.7* 8.4* 9.2 9.8 9.4 8.8*  MG  --   --  2.3  --  2.2  --     CBC: Recent Labs  Lab 07/21/22 0310 07/22/22 0313 07/24/22 0430  WBC 10.7* 8.8 9.9  NEUTROABS  --  6.4  --   HGB 9.1* 8.4* 8.3*  HCT 28.5* 26.9* 26.0*  MCV 85.3 86.2 85.5  PLT 629* 607* 616*    CBG: Recent Labs  Lab 07/23/22 1153 07/23/22 1639 07/23/22 2110 07/24/22 0604 07/24/22 1140  GLUCAP 130* 159* 250* 128* 100*    Brief HPI:   Darryl Frye is a 61 y.o. male with a history of atrial fibrillation/flutter nonischemic cardiomyopathy who underwent right heart catheterization on 06/27/2022. He had presented with worsening symptoms, decompensated heart failure. This showed markedly elevated filling pressures, primarily pulmonary venous hypertension, low cardiac output and low PAPI. He was admitted for inotrope and diuresis. Started on milrinone and Lasix infusion. He was evaluated by Dr. Tenny Frye on 11/8 for LVAD. Eliquis was held and he was started on a heparin drip. He agreed to participate in the [facility] trial and signed the informed consent on 11/13. He underwent  left ventricular assist device, TEE and left atrial appendage closure on 11/14. Admitted to ICU and critical care medicine consulted. He was able to be extubated on 11/16. Low-grade fever and leukocytosis and placed on vancomycin and cefepime. Developed A-fib with RVR responded well to amiodarone and back in normal sinus rhythm. Warfarin and aspirin started. Acute kidney injury improving. ENT consultation by Dr. Constance Frye obtained on 11/22 due to persistent leading from left nare. Left side was cauterized and packing placed. Last chest tube discontinued on 11/22. Antibiotics changed to Zosyn given persistent infiltrates on chest x-ray and sputum culture positive for capnocytophaga. Diuresis continued. O2 requirement slowly proving. Dyspnea on exertion continued 11/24. Tolerating diet. Lasix infusion stopped 11/25. No further epistaxis and packing removed 11/27. Has PICC line right upper extremity. Foley catheter discontinued and he is voiding spontaneously. The patient completed discharge education from heart failure nursing team. The patient requires inpatient physical medicine and rehabilitation evaluations and treatment secondary to dysfunction due to chronic congestive heart failure, cardiomyopathy, cardiogenic shock.    Hospital Course: Darryl Frye was admitted to rehab 07/21/2022 for inpatient therapies to consist of PT, ST and OT at least three hours five days a week. Past admission physiatrist, therapy team and rehab RN have worked together to provide customized collaborative inpatient rehab. Heart failure team continued to follow and assess daily labs. Started on Vit D and calcium for low normal serum vitamin D. Mild intermittent nosebleed on 12/03. Stopped with  Afrin, local ice and pressure. Described overall increased weakness and incidence of near-syncope overnight 12/4. Follow-up HF team 12/4 added 2.5 mg of metolazone and decreased torsemide to 40 mg daily and stopped metolazone due to increase in  serum creatinine. Daily labs continued. Warfarin therapeutic with INR 2.3 on 12/7. Serum creatinine trending downward.   Blood pressures were monitored on TID basis and continued on spirinolactone, torsemide, sildenafil. Added metolazone 2.5 mg on 12/4 but felt dizzy and creatinine rose so discontinued and torsemide decrease to daily dosing.  Diabetes has been monitored with ac/hs CBG checks and SSI was use prn for tighter BS control. Jardiance 10 mg daily, semglee 25 units q AM and 22 units q HS. Novolog 5 units with meals decreased to 4 units.   Rehab course: During patient's stay in rehab weekly team conferences were held to monitor patient's progress, set goals and discuss barriers to discharge. At admission, patient required min assist with mobility and basic self-care skills.  He has had improvement in activity tolerance, balance, postural control as well as ability to compensate for deficits. He has had improvement in functional use RUE/LUE  and RLE/LLE as well as improvement in awareness. Pt (S) for safety for LVAD management. Pt (S) for bed mobility and stand pivot to w/c. Pt performed self-care tasks at sink with set-up assistance.   Disposition: Home There are no questions and answers to display.       Diet: carb modified  Special Instructions: No driving, alcohol consumption or tobacco use.   Allergies as of 07/24/2022   No Known Allergies   Med Rec must be completed prior to using this [facility]***        Signed: Barbie Frye 07/24/2022, 12:43 PM

## 2022-07-24 NOTE — Progress Notes (Addendum)
Patient ID: Darryl Frye, male   DOB: 1961/05/20, 61 y.o.   MRN: [id number] Advanced Heart Failure VAD Team Note  PCP-Cardiologist: Carlyle Dolly, MD   Subjective:   Had nose bleed over the weekend. Resolved with Afrin and nasal packing.   Feels ok. Denies SOB>   LVAD INTERROGATION:  HeartMate 3 LVAD:   Flow 4.7 liters/min, speed 5400, power 4, PI 3.1 .    Objective:    Vital Signs:   Temp:  [98.1 F (36.7 C)-98.6 F (37 C)] 98.6 F (37 C) (12/04 0420) Pulse Rate:  [81-85] 83 (12/04 0420) Resp:  [16-18] 18 (12/04 0420) BP: (81-110)/(59-82) 99/82 (12/04 0420) SpO2:  [91 %-96 %] 94 % (12/04 0420) Weight:  [86.6 kg] 86.6 kg (12/04 0500) Last BM Date : 07/23/22 Mean arterial Pressure 80s   Intake/Output:   Intake/Output Summary (Last 24 hours) at 07/24/2022 0816 Last data filed at 07/24/2022 0100 Gross per 24 hour  Intake 1340 ml  Output 3000 ml  Net -1660 ml     Physical Exam  Physical Exam: GENERAL: No acute distress. Sitting on the side of te bed.  HEENT: normal L nare packed  NECK: Supple, JVP flat  .  2+ bilaterally, no bruits.  No lymphadenopathy or thyromegaly appreciated.   CARDIAC:  Mechanical heart sounds with LVAD hum present.  LUNGS:  Clear to auscultation bilaterally.  ABDOMEN:  Soft, round, nontender, positive bowel sounds x4.     LVAD exit site:  Dressing dry and intact.  No erythema or drainage.  Stabilization device present and accurately applied.  Driveline dressing is being changed daily per sterile technique. EXTREMITIES:  Warm and dry, no cyanosis, clubbing, rash or edema . RUE PICC  NEUROLOGIC:  Alert and oriented x 3.    No aphasia.  No dysarthria.  Affect pleasant.      Labs   Basic Metabolic Panel: Recent Labs  Lab 07/19/22 0413 07/20/22 0345 07/21/22 0310 07/22/22 0313 07/24/22 0430  NA 129* 133* 134* 133* 133*  K 3.6 4.1 4.3 3.9 3.9  CL 91* 92* 90* 90* 91*  CO2 27 28 27 30 29   GLUCOSE 79 140* 138* 133* 130*  BUN 26* 27* 33*  32* 28*  CREATININE 1.11 1.40* 1.18 1.36* 1.35*  CALCIUM 8.4* 9.2 9.8 9.4 8.8*  MG  --  2.3  --  2.2  --     Liver Function Tests: Recent Labs  Lab 07/22/22 0313  AST 30  ALT 63*  ALKPHOS 223*  BILITOT 0.4  PROT 7.3  ALBUMIN 2.6*   No results for input(s): "LIPASE", "AMYLASE" in the last 168 hours. No results for input(s): "AMMONIA" in the last 168 hours.  CBC: Recent Labs  Lab 07/18/22 0531 07/19/22 0413 07/21/22 0310 07/22/22 0313 07/24/22 0430  WBC 11.7* 9.6 10.7* 8.8 9.9  NEUTROABS  --   --   --  6.4  --   HGB 8.3* 8.2* 9.1* 8.4* 8.3*  HCT 26.0* 25.6* 28.5* 26.9* 26.0*  MCV 85.8 86.2 85.3 86.2 85.5  PLT 595* 568* 629* 607* 616*    INR: Recent Labs  Lab 07/20/22 0345 07/21/22 0310 07/22/22 0313 07/23/22 0439 07/24/22 0430  INR 1.7* 1.9* 1.9* 2.0* 2.0*    Other results: EKG:    Imaging   No results found.   Medications:     Scheduled Medications:  amiodarone  200 mg Oral BID   aspirin  81 mg Oral Daily   atorvastatin  20 mg Oral Daily  calcium-vitamin D  1 tablet Oral Q breakfast   Chlorhexidine Gluconate Cloth  6 each Topical Daily   digoxin  0.125 mg Oral Daily   empagliflozin  10 mg Oral Daily   Fe Fum-Vit C-Vit B12-FA  1 capsule Oral QPC breakfast   influenza vac split quadrivalent PF  0.5 mL Intramuscular Once   insulin aspart  0-15 Units Subcutaneous TID WC   insulin aspart  0-5 Units Subcutaneous QHS   insulin aspart  5 Units Subcutaneous TID WC   insulin glargine-yfgn  22 Units Subcutaneous QHS   insulin glargine-yfgn  25 Units Subcutaneous Daily   levothyroxine  25 mcg Oral Q0600   magnesium oxide  400 mg Oral BID   melatonin  3 mg Oral QHS   multivitamin with minerals  1 tablet Oral Daily   oxymetazoline  1 spray Each Nare BID   pantoprazole  40 mg Oral Daily   pneumococcal 20-valent conjugate vaccine  0.5 mL Intramuscular Once   polyethylene glycol  17 g Oral BID   senna-docusate  1 tablet Oral BID   sertraline  50 mg  Oral Daily   sildenafil  20 mg Oral TID   sodium chloride flush  10-40 mL Intracatheter Q12H   spironolactone  25 mg Oral Daily   torsemide  40 mg Oral BID   Warfarin - Pharmacist Dosing Inpatient   Does not apply q1600    Infusions:  sodium chloride     sodium chloride      PRN Medications: sodium chloride, acetaminophen, albuterol, alum & mag hydroxide-simeth, diphenhydrAMINE, guaiFENesin-dextromethorphan, lidocaine, methocarbamol, mouth rinse, oxyCODONE, polyethylene glycol, prochlorperazine **OR** prochlorperazine **OR** prochlorperazine, sodium phosphate, sorbitol, traZODone    Assessment/Plan:    1. Acute on chronic systolic CHF/cardiogenic shock: Nonischemic cardiomyopathy, diagnosed 2020.  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  Cath in 12/20 with no significant coronary disease.  Medtronic ICD. Most recent echo in 2/23 showed EF 20-25% with normal RV.  Recently, patient has been symptomatically worse, NYHA class IV with profound orthopnea. RHC showed markedly elevated filling pressures, primarily pulmonary venous hypertension, low cardiac output, and low PAPI.  Patient was admitted for inotrope and diuresis. Echo this admission with EF <20%, mod RV dysfunction.  He was started on milrinone with improvement in hemodynamics.  We proceeded with HM3 LVAD with LAA clip on 11/14.  He is now off milrinone and remains on sildenafil 20 tid.   - Maps stable.  - Volume status appears stable. Continue torsemide 40 mg bid.  - Continue sildenafil 20 tid.  - Continue 25 mg spiro daily.  - Continue digoxin 0.125 daily - Continue Jardiance 10 mg daily - Renal function stable.  2. A fib RVR, post-op: H/O S/p flutter ablation.  Remains in NSR.  - Continue po amiodarone 200 BID, decrease to daily at discharge.  -  Continue warfarin.  3. VAD management: s/p HM-3 VAD on 11/14.  Bedside echo 11/18 LV small, VAD cannula well aligned, AoV opening every beat, septum pulling to left RV moderately HK, small to moderate anterior effusion. Ramp echo 11/22 RV mild to mod HK. Speed turned to 5400. Follow LDH daily. MAP 80s generally.  - Now on warfarin with INR 2 - LDH stable.  - Continue ASA 81. - VAD interrogated personally. Parameters stable. 4. H/o DVT: On  anticoagulation.  5. Acute hypoxemic respiratory failure, post-op: Resolved.  On room air  Continue incentive spirometry. - Encourage mobility.  - Completed 1 week of Zosyn.  - Stop oxygen, tolerate sats 90% and above.  6. AKI: Creatinine stable at 1.35.  - Diuretic management as above - Needs daily BMET.  7. ID: Afebrile, WBCs 13. He completed initial vanc/cefepime empiric abx for ?post-op PNA. 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 CCM switched to zosyn given persistent infiltrates on imaging and he completed 1 week of Zosyn.  Now afebrile.  8. Hypothyroidism - On levothyroxine.  9. Hypokalemia - Stable.  - Continue spironolactone.  10. Epistaxis: s/p cauterization/packing 11/22, packing now removed.  Recurrent last night, has now stopped. L nare packed.  - May need to place on antibiotics until packing removed.  11. Anemia: Hgb 8.4 yesterday, would continue daily for now.   12. Hyponatremia: Na 133. Fluid restrict.  22. 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   For now he can't shower. Needs to allow ongoing driveline incorporation.   I reviewed the LVAD parameters from today, and compared the results to the patient's prior recorded data.  No programming changes were made.  The LVAD is functioning within specified parameters.  The patient performs LVAD self-test daily.  LVAD interrogation was negative for any significant power changes, alarms or PI events/speed drops.  LVAD equipment check completed and is in good  working order.  Back-up equipment present.   LVAD education done on emergency procedures and precautions and reviewed exit site care.  Length of Stay: 3  Darrick Grinder, NP 07/24/2022, 8:16 AM  VAD Team --- VAD ISSUES ONLY--- Pager [phone] (7am - 7am)  Advanced Heart Failure Team  Pager [phone] (M-F; 7a - 5p)  Please contact [facility] for night-coverage after hours (5p -7a ) and weekends on [email or web address]  Patient seen with NP, agree with the above note.   He is doing well this evening, epistaxis has resolved.  Creatinine stable.   General: Well appearing this am. NAD.  HEENT: Normal. Neck: Supple, JVP 8 cm. Carotids OK.  Cardiac:  Mechanical heart sounds with LVAD hum present.  Lungs:  CTAB, normal effort.  Abdomen:  NT, ND, no HSM. No bruits or masses. +BS  LVAD exit site: Well-healed and incorporated. Dressing dry and intact. No erythema or drainage. Stabilization device present and accurately applied. Driveline dressing changed daily per sterile technique. Extremities:  Warm and dry. No cyanosis, clubbing, rash, or edema.  Neuro:  Alert & oriented x 3. Cranial nerves grossly intact. Moves all 4 extremities w/o difficulty. Affect pleasant    Continue current cardiac medications, volume status looks optimized and he is off oxygen.   LVAD parameters stable.    Continue PT.   Loralie Champagne 07/24/2022 5:47 PM

## 2022-07-25 LAB — GLUCOSE, CAPILLARY
Glucose-Capillary: 149 mg/dL — ABNORMAL HIGH (ref 70–99)
Glucose-Capillary: 146 mg/dL — ABNORMAL HIGH (ref 70–99)
Glucose-Capillary: 109 mg/dL — ABNORMAL HIGH (ref 70–99)
Glucose-Capillary: 117 mg/dL — ABNORMAL HIGH (ref 70–99)
Glucose-Capillary: 239 mg/dL — ABNORMAL HIGH (ref 70–99)

## 2022-07-25 LAB — PROTIME-INR
INR: 2.1 — ABNORMAL HIGH (ref 0.8–1.2)
Prothrombin Time: 23.2 seconds — ABNORMAL HIGH (ref 11.4–15.2)

## 2022-07-25 LAB — BASIC METABOLIC PANEL
Anion gap: 12 (ref 5–15)
BUN: 26 mg/dL — ABNORMAL HIGH (ref 6–20)
CO2: 28 mmol/L (ref 22–32)
Calcium: 8.9 mg/dL (ref 8.9–10.3)
Chloride: 92 mmol/L — ABNORMAL LOW (ref 98–111)
Creatinine, Ser: 1.37 mg/dL — ABNORMAL HIGH (ref 0.61–1.24)
GFR, Estimated: 59 mL/min — ABNORMAL LOW (ref >60)
Glucose, Bld: 89 mg/dL (ref 70–99)
Potassium: 3.7 mmol/L (ref 3.5–5.1)
Sodium: 132 mmol/L — ABNORMAL LOW (ref 135–145)

## 2022-07-25 LAB — CBC
HCT: 26.5 % — ABNORMAL LOW (ref 39.0–52.0)
Hemoglobin: 8.4 g/dL — ABNORMAL LOW (ref 13.0–17.0)
MCH: 27 pg (ref 26.0–34.0)
MCHC: 31.7 g/dL (ref 30.0–36.0)
MCV: 85.2 fL (ref 80.0–100.0)
Platelets: 609 10*3/uL — ABNORMAL HIGH (ref 150–400)
RBC: 3.11 MIL/uL — ABNORMAL LOW (ref 4.22–5.81)
RDW: 17.1 % — ABNORMAL HIGH (ref 11.5–15.5)
WBC: 9.5 10*3/uL (ref 4.0–10.5)
nRBC: 0 % (ref 0.0–0.2)

## 2022-07-25 LAB — LACTATE DEHYDROGENASE: LDH: 202 U/L — ABNORMAL HIGH (ref 98–192)

## 2022-07-25 MED ORDER — SALINE SPRAY 0.65 % NA SOLN
2.0000 | Freq: Four times a day (QID) | NASAL | Status: DC
  Administered 2022-07-25 – 2022-07-28 (×13): 2 via NASAL
  Filled 2022-07-25: qty 44

## 2022-07-25 MED ORDER — CHLORHEXIDINE GLUCONATE CLOTH 2 % EX PADS
6.0000 | MEDICATED_PAD | Freq: Two times a day (BID) | CUTANEOUS | Status: DC
  Administered 2022-07-25 – 2022-07-31 (×12): 6 via TOPICAL

## 2022-07-25 MED ORDER — METOLAZONE 2.5 MG PO TABS
2.5000 mg | ORAL_TABLET | Freq: Once | ORAL | Status: AC
  Administered 2022-07-25: 2.5 mg via ORAL
  Filled 2022-07-25: qty 1

## 2022-07-25 MED ORDER — POTASSIUM CHLORIDE CRYS ER 20 MEQ PO TBCR
40.0000 meq | EXTENDED_RELEASE_TABLET | Freq: Once | ORAL | Status: AC
  Administered 2022-07-25: 40 meq via ORAL
  Filled 2022-07-25: qty 2

## 2022-07-25 MED ORDER — WARFARIN SODIUM 4 MG PO TABS
4.0000 mg | ORAL_TABLET | Freq: Once | ORAL | Status: AC
  Administered 2022-07-25: 4 mg via ORAL
  Filled 2022-07-25: qty 1

## 2022-07-25 NOTE — Progress Notes (Signed)
Physical Therapy Session Note  Patient Details  Name: Darryl Frye MRN: [id number] Date of Birth: [birth year]  Today's Date: 07/25/2022 PT Individual Time: 1016-1127 PT Individual Time Calculation (min): 71 min   Short Term Goals: Week 1:  PT Short Term Goal 1 (Week 1): Patient will ambulate >50' with LRAD and CGA PT Short Term Goal 2 (Week 1): Patient will ascend/descend x4 steps with CGA PT Short Term Goal 3 (Week 1): Patient will continue to demonstrate proper understanding of HM3/Batteries  Skilled Therapeutic Interventions/Progress Updates:      Therapy Documentation Precautions:  Precautions Precautions: Fall, Sternal Precaution Booklet Issued: No Precaution Comments: LVAD, watch SpO2- 3L oxygen on inital eval, but weaned to 2L via [place] with SpO2 >90% Restrictions Weight Bearing Restrictions: Yes RUE Weight Bearing: Non weight bearing LUE Weight Bearing: Non weight bearing Other Position/Activity Restrictions: UE sternal precautions  Pt agreeable to PT with emphasis on stair training, LE strength training and improving activity tolerance. Pt declines pain and provided with intermittent rest breaks for energy conservation. Pt supervision for transfer from wall to batterries and with w/c propulsion ~200 ft with B UE"s. Pt requires CGA with negotiation of 3 x 4 ( 6 inch) steps with step to pattern and use of B HR's while adhering to sternal precautions. Pt participated in blocked practice of sit to stand transfers with no UE support and requires close (S) for safety. Pt transitioned to sit to stand from airex pad on mat to simulate transfers from couch to prepare pt for discharge. Pt performed 10 transfers in total with supervision. Pt requires close (S) for static standing ~2 minutes and demonstrates improved LE activity tolerance. Pt transitioned to dynamic standing while dribbling basketball ~ 2 x 30 seconds. Pt transported to room for time management and energy conservation and left  seated edge of bed with all needs in reach and alarm on.     Therapy/Group: Individual Therapy  Verl Dicker Verl Dicker PT, DPT  07/25/2022, 7:47 AM

## 2022-07-25 NOTE — Patient Care Conference (Signed)
Inpatient RehabilitationTeam Conference and Plan of Care Update Date: 07/25/2022   Time: 11:31 AM    Patient Name: Darryl Frye      Medical Record Number: [id number]  Date of Birth: 12/05/1960 Sex: Male         Room/Bed: 4W22C/4W22C-01 Payor Info: Payor: MEDICAID [place] / Plan: MEDICAID [place] ACCESS / Product Type: *No Product type* /    Admit Date/Time:  07/21/2022  4:18 PM  Primary Diagnosis:  Heart failure [facility])  Hospital Problems: Principal Problem:   Heart failure ([place]) Active Problems:   Debility   Prerenal azotemia    Expected Discharge Date: Expected Discharge Date: 07/31/22  Team Members Present: Physician leading conference: Dr. Jennye Boroughs Social Worker Present: Ovidio Kin, LCSW Nurse Present: Tacy Learn, RN PT Present: Verl Dicker, PT OT Present: Jamey Ripa, OT PPS Coordinator present : Gunnar Fusi, SLP     Current Status/Progress Goal Weekly Team Focus  Bowel/Bladder    Continent B/B   Remain continent B/B  Assess every 3 hours and PRN    Swallow/Nutrition/ Hydration               ADL's   CGA BSC transfers, UB self care with set up and close S, LB self care with mod A   mod I   Pt/family educ, BORG scale and energy conservation/task simplifcation, progession of activity tolerance and strength    Mobility   (S) bed mobility, CGA STS &  with rollator up to 111'; 4 steps with B HR and MinA- Poor endurance/activity tolerance and B LE weakness   Mod I  activity tolerance, gait, transfers, pt/fam education    Communication                Safety/Cognition/ Behavioral Observations               Pain    Denies pain   Remain pain free  Assess every 4 hours and PRN   Skin    Incision to chest and LVAD dressing  Skin remain free of infection/breakdown  Assess every shift and PRN      Discharge Planning:  Home alone with ex-wife, daughter and friends to check on and make sure needs are met.   Team Discussion: Heart  failure. Continent B/B. Denies pain. Incision healing. Patient able to do self check and can connect and disconnect LVAD with supervision. MD to discuss fluid restrictions with HF team. OT is min A with supervision goals. PT SOB at times with discussion of energy conservation Mod I goals. Cardiac Rehab at discharge.  Patient on target to meet rehab goals: yes, Ambulating 111 feet  with rollater. CG 4 ste with handrails R/L  *See Care Plan and progress notes for [place] and short-term goals.   Revisions to Treatment Plan:  Medication adjustments, monitor labs, encourage energy conservation   Teaching Needs: Medications, safety, gait/transfer training, LVAD safety, skin/wound care, etc  Current Barriers to Discharge: Decreased caregiver support, Wound care, Lack of/limited family support, Weight, Weight bearing restrictions, and Medication compliance  Possible Resolutions to Barriers: Family education, nursing education, order recommended DME     Medical Summary Current Status: Debility after heart failure/LVAD with multiple medical including pneumonia. Pt with poorly controlled dm, recurrent epistaxis after cautering on acute.  Barriers to Discharge: Medical stability;Uncontrolled Diabetes   Possible Resolutions to [employer]: daily assessment of lvad and volume status, packing/mgt of nose bleed, correction of hyperglycemia with adjustments in dm regimen   Continued Need  for Acute Rehabilitation Level of Care: The patient requires daily medical management by a physician with specialized training in physical medicine and rehabilitation for the following reasons: Direction of a multidisciplinary physical rehabilitation program to maximize functional independence : Yes Medical management of patient stability for increased activity during participation in an intensive rehabilitation regime.: Yes Analysis of laboratory values and/or radiology reports with any subsequent need for  medication adjustment and/or medical intervention. : Yes   I attest that I was present, lead the team conference, and concur with the assessment and plan of the team.   Ernest Pine 07/25/2022, 3:11 PM

## 2022-07-25 NOTE — Discharge Instructions (Addendum)
Inpatient Rehab Discharge Instructions  [place] Discharge date and time: 07/31/2022  Activities/Precautions/ Functional Status: Activity: no lifting, driving, or strenuous exercise until cleared by MD Diet: diabetic diet, limit the amount of water you drink Wound Care: as directed; next dressing change 08/03/2022 Functional status:  ___ No restrictions     ___ Walk up steps independently ___ 24/7 supervision/assistance   ___ Walk up steps with assistance _x__ Intermittent supervision/assistance  ___ Bathe/dress independently ___ Walk with walker     __x_ Bathe/dress with assistance ___ Walk Independently    ___ Shower independently ___ Walk with assistance    ___ Shower with assistance _x__ No alcohol     ___ Return to work/school ________  Special Instructions:   Call heart failure/LVAD coumadin clinic as instructed.   No driving, alcohol consumption or tobacco use.  Limit your fluid intake to less than 60 ounces (less than approximately 2 quarts) every 24 hours.  Do not shower until cleared by heart failure medical team follow-up.  Check your fingerstick blood sugars four times a day and record. Bring this information with you to PCP follow-up appointment.  COMMUNITY REFERRALS UPON DISCHARGE:    [phone] WILL BE CALLING TO SET UP APPOINTMENT  Medical Equipment/Items Ordered: ROLLATOR AND TUB BENCH                                                 Agency/Supplier:[employer] [employer]  [phone]   My questions have been answered and I understand these instructions. I will adhere to these goals and the provided educational materials after my discharge from the hospital.  Patient/Caregiver Signature _______________________________ Date __________  Clinician Signature _______________________________________ Date __________  Please bring this form and your medication list with you to all your follow-up doctor's appointments.     Information on my medicine - Coumadin   (Warfarin)  This medication education was reviewed with me or my healthcare representative as part of my discharge preparation.  The pharmacist that spoke with me during my hospital stay was:  Einar Grad, [facility]  Why was Coumadin prescribed for you? Coumadin was prescribed for you because you have a blood clot or a medical condition that can cause an increased risk of forming blood clots. Blood clots can cause serious health problems by blocking the flow of blood to the heart, lung, or brain. Coumadin can prevent harmful blood clots from forming. As a reminder your indication for Coumadin is:  Blood Clot Prevention after Heart Pump Surgery  What test will check on my response to Coumadin? While on Coumadin (warfarin) you will need to have an INR test regularly to ensure that your dose is keeping you in the desired range. The INR (international normalized ratio) number is calculated from the result of the laboratory test called prothrombin time (PT).  If an INR APPOINTMENT HAS NOT ALREADY BEEN MADE FOR YOU please schedule an appointment to have this lab work done by your health care provider within 7 days. Your INR goal is usually a number between:  2 to 3 or your provider may give you a more narrow range like 2-2.5.  Ask your health care provider during an office visit what your goal INR is.  What  do you need to  know  About  COUMADIN? Take Coumadin (warfarin) exactly as prescribed by  your healthcare provider about the same time each day.  DO NOT stop taking without talking to the doctor who prescribed the medication.  Stopping without other blood clot prevention medication to take the place of Coumadin may increase your risk of developing a new clot or stroke.  Get refills before you run out.  What do you do if you miss a dose? If you miss a dose, take it as soon as you remember on the same day then continue your regularly scheduled regimen the  next day.  Do not take two doses of Coumadin at the same time.  Important Safety Information A possible side effect of Coumadin (Warfarin) is an increased risk of bleeding. You should call your healthcare provider right away if you experience any of the following: Bleeding from an injury or your nose that does not stop. Unusual colored urine (red or dark brown) or unusual colored stools (red or black). Unusual bruising for unknown reasons. A serious fall or if you hit your head (even if there is no bleeding).  Some foods or medicines interact with Coumadin (warfarin) and might alter your response to warfarin. To help avoid this: Eat a balanced diet, maintaining a consistent amount of Vitamin K. Notify your provider about major diet changes you plan to make. Avoid alcohol or limit your intake to 1 drink for women and 2 drinks for men per day. (1 drink is 5 oz. wine, 12 oz. beer, or 1.5 oz. liquor.)  Make sure that ANY health care provider who prescribes medication for you knows that you are taking Coumadin (warfarin).  Also make sure the healthcare provider who is monitoring your Coumadin knows when you have started a new medication including herbals and non-prescription products.  Coumadin (Warfarin)  Major Drug Interactions  Increased Warfarin Effect Decreased Warfarin Effect  Alcohol (large quantities) Antibiotics (esp. Septra/Bactrim, Flagyl, Cipro) Amiodarone (Cordarone) Aspirin (ASA) Cimetidine (Tagamet) Megestrol (Megace) NSAIDs (ibuprofen, naproxen, etc.) Piroxicam (Feldene) Propafenone (Rythmol SR) Propranolol (Inderal) Isoniazid (INH) Posaconazole (Noxafil) Barbiturates (Phenobarbital) Carbamazepine (Tegretol) Chlordiazepoxide (Librium) Cholestyramine (Questran) Griseofulvin Oral Contraceptives Rifampin Sucralfate (Carafate) Vitamin K   Coumadin (Warfarin) Major Herbal Interactions  Increased Warfarin Effect Decreased Warfarin Effect  Garlic Ginseng Ginkgo  biloba Coenzyme Q10 Green tea St. John's wort    Coumadin (Warfarin) FOOD Interactions  Eat a consistent number of servings per week of foods HIGH in Vitamin K (1 serving =  cup)  Collards (cooked, or boiled & drained) Kale (cooked, or boiled & drained) Mustard greens (cooked, or boiled & drained) Parsley *serving size only =  cup Spinach (cooked, or boiled & drained) Swiss chard (cooked, or boiled & drained) Turnip greens (cooked, or boiled & drained)  Eat a consistent number of servings per week of foods MEDIUM-HIGH in Vitamin K (1 serving = 1 cup)  Asparagus (cooked, or boiled & drained) Broccoli (cooked, boiled & drained, or raw & chopped) Brussel sprouts (cooked, or boiled & drained) *serving size only =  cup Lettuce, raw (green leaf, endive, romaine) Spinach, raw Turnip greens, raw & chopped   These websites have more information on Coumadin (warfarin):  FailFactory.se; VeganReport.com.au;

## 2022-07-25 NOTE — Progress Notes (Signed)
PROGRESS NOTE   Subjective/Complaints: Pt without complaints this morning. Denied any weakness or fatigue. Michela Pitcher things went fairly well in therapy yesterday. No further nose bleeding  ROS: Patient denies fever, rash, sore throat, blurred vision, dizziness, nausea, vomiting, diarrhea, cough, shortness of breath or chest pain, joint or back/neck pain, headache, or mood change.    Objective:   No results found. Recent Labs    07/24/22 0430 07/25/22 0415  WBC 9.9 9.5  HGB 8.3* 8.4*  HCT 26.0* 26.5*  PLT 616* 609*   Recent Labs    07/24/22 0430 07/25/22 0415  NA 133* 132*  K 3.9 3.7  CL 91* 92*  CO2 29 28  GLUCOSE 130* 89  BUN 28* 26*  CREATININE 1.35* 1.37*  CALCIUM 8.8* 8.9    Intake/Output Summary (Last 24 hours) at 07/25/2022 1142 Last data filed at 07/25/2022 0900 Gross per 24 hour  Intake 1072 ml  Output 3000 ml  Net -1928 ml        Physical Exam: Vital Signs Blood pressure 102/68, pulse 78, temperature 99.3 F (37.4 C), resp. rate 16, height 5\' 11"  (1.803 m), weight 86.9 kg, SpO2 94 %.  Constitutional: No distress . Vital signs reviewed. HEENT: NCAT, EOMI, oral membranes moist Neck: supple Cardiovascular: LVAD hum    Respiratory/Chest: CTA Bilaterally without wheezes or rales. Normal effort    GI/Abdomen: BS +, non-tender, non-distended Ext: no clubbing, cyanosis, or edema Psych: pleasant and cooperative  Skin: Clean and intact without signs of breakdown. Line sites intact Neuro:  Alert and oriented x 3. Normal insight and awareness. Intact Memory. Normal language and speech. Cranial nerve exam unremarkable. Motor 4-5/5. Sensory exam normal for light touch and pain in all 4 limbs. No limb ataxia or cerebellar signs. No abnormal tone appreciated.   Musculoskeletal: some limitations d/t LVAD but normal rom for the most part.    Assessment/Plan: 1. Functional deficits which require 3+ hours per day  of interdisciplinary therapy in a comprehensive inpatient rehab setting. Physiatrist is providing close team supervision and 24 hour management of active medical problems listed below. Physiatrist and rehab team continue to assess barriers to discharge/monitor patient progress toward functional and medical goals  Care Tool:  Bathing    Body parts bathed by patient: Right arm, Left arm, Chest, Abdomen, Front perineal area, Face, Left upper leg   Body parts bathed by helper: Right lower leg, Left lower leg, Buttocks     Bathing assist Assist Level: Moderate Assistance - Patient 50 - 74%     Upper Body Dressing/Undressing Upper body dressing   What is the patient wearing?: Pull over shirt    Upper body assist Assist Level: Minimal Assistance - Patient > 75%    Lower Body Dressing/Undressing Lower body dressing      What is the patient wearing?: Pants     Lower body assist Assist for lower body dressing: Moderate Assistance - Patient 50 - 74%     Toileting Toileting    Toileting assist Assist for toileting: Minimal Assistance - Patient > 75%     Transfers Chair/bed transfer  Transfers assist     Chair/bed transfer assist level: Minimal Assistance -  Patient > 75%     Locomotion Ambulation   Ambulation assist      Assist level: Minimal Assistance - Patient > 75% Assistive device: Hand held assist Max distance: 20'   Walk 10 feet activity   Assist     Assist level: Minimal Assistance - Patient > 75% Assistive device: Hand held assist   Walk 50 feet activity   Assist Walk 50 feet with 2 turns activity did not occur: Safety/medical concerns (Unable to ambulate >20' at this time secondary to impaired endurance/activity tolerance)         Walk 150 feet activity   Assist Walk 150 feet activity did not occur: Safety/medical concerns         Walk 10 feet on uneven surface  activity   Assist     Assist level: Minimal Assistance - Patient >  75% Assistive device: Hand held assist   Wheelchair     Assist Is the patient using a wheelchair?: Yes Type of Wheelchair: Manual Wheelchair activity did not occur: Safety/medical concerns (Unable to perform wheelchair mobility at this time secondary to sternal precautions)         Wheelchair 50 feet with 2 turns activity    Assist    Wheelchair 50 feet with 2 turns activity did not occur: Safety/medical concerns       Wheelchair 150 feet activity     Assist  Wheelchair 150 feet activity did not occur: Safety/medical concerns       Blood pressure 102/68, pulse 78, temperature 99.3 F (37.4 C), resp. rate 16, height 5\' 11"  (1.803 m), weight 86.9 kg, SpO2 94 %.  Medical Problem List and Plan: 1. Functional deficits secondary to debility secondary to acute on chronic heart failure, NICM, cardiogenic shock status post LVAD.             -patient may shower             -ELOS/Goals: 5-7 days          -Continue CIR therapies including PT and OT. Interdisciplinary team conference today to discuss goals, barriers to discharge, and dc planning.   2.  Antithrombotics: -DVT/anticoagulation:  Pharmaceutical: Coumadin             -antiplatelet therapy: Aspirin 81 mg daily 3. Pain Management: Tylenol, oxycodone as needed, Lidoderm patch as needed 4. Depression: continue magnesium oxide.              -continue Zoloft 50 mg daily             -continue melatonin 3 mg nightly             -antipsychotic agents: n/a 5. Neuropsych/cognition: This patient is capable of making decisions on his own behalf. 6. Skin/Wound Care: Routine skin care checks             -patient's caregiver, [name], trained on every other day driveline dressing changes per VAD coordinator   7. Fluids/Electrolytes/Nutrition: Strict I's and O's and follow-up chemistries             -2 g sodium diet/1500 mL fluid restriction             -continue iron/vitamin supplements/mag ox             -intake is solid  presently   8: Acute on chronic HFrEF/nonischemic cardiomyopathy status post LVAD             -Continue spironolactone 25 mg daily             -  Continue digoxin 0.125 mg daily        -Continue torsemide 40 mg twice daily -Continue warfarin 3 mg q 1600 hrs. Continue pharmacy consult/INR -Continue sildenafil 20 mg TID -Continue Jardiance 10 mg daily -Continue magnesium oxide 400 mg twice daily -Page VAD coordinator for equipment or driveline issues Filed Weights   07/24/22 0500 07/24/22 1632 07/25/22 0500  Weight: 86.6 kg 87.4 kg 86.9 kg   -volme mgt per LVAD team   9: Paroxysmal atrial fibrillation/flutter status post ablation -continue amiodarone 200 mg twice daily -Continue warfarin 3 mg q 1600 hrs.   10: Hospital acquired pneumonia Zosyn>>completed 7 days             -wean supplemental oxygen as able             -pulmonary hygiene   11: Hyponatremia: improved; follow-up BMP             -May eventually need tolvaptan and if persists when euvolemic per cardiology   12: Diabetes mellitus, type II: CBGs before every meal and nightly  CBG (last 3)  Recent Labs    07/24/22 2047 07/25/22 0556 07/25/22 1134  GLUCAP 149* 146* 109*    -12/5 improved control             -continue Jardiance 10 mg daily             -  Semglee  28 units every morning and   22 units nightly             -NovoLog 5 units 3 times daily with meals             -Sliding scale insulin             -mag level 2.2 13: Acute kidney injury: creatinine worsening, will check with heart failure team if we can liberalize his fluid restriction to 1835mL.  14: History of DVT: now on warfarin 15: Hypothyroidism: Continue Synthroid 16: Anemia: Status post 1 unit PRBCs; follow-up CBC             -Left epistaxis status post cauterization, resolved  -no further re-bleeding after afrin/nasal packing  -12/5 ocean nasal spray ordered qid 17: Hyperlipidemia: Continue Lipitor 20 mg daily 18: GI prophylaxis: Continue Protonix  40 mg daily 19: Partial DNR code status: no chest compressions 20. Screening for vitamin D deficiency:   -vitamin d level in low normal range -started vitamin D supplement 22. Hypocalcemia: calcium supplement started  LOS: 4 days A FACE TO FACE EVALUATION WAS PERFORMED  Meredith Staggers 07/25/2022, 11:42 AM

## 2022-07-25 NOTE — Progress Notes (Addendum)
Patient ID: Darryl Frye, male   DOB: [birth year], 61 y.o.   MRN: [id number] Advanced Heart Failure VAD Team Note  PCP-Cardiologist: Carlyle Dolly, MD   Subjective:   Had nose bleed over the weekend. Resolved with Afrin and nasal packing.   Feels good this morning. Denies CP or SOB.   LVAD INTERROGATION:  HeartMate 3 LVAD:   Flow 4.9 liters/min, speed 5400, power 4, PI 3.6.    Objective:    Vital Signs:   Temp:  [98.5 F (36.9 C)-99.3 F (37.4 C)] 99.3 F (37.4 C) (12/05 0420) Pulse Rate:  [58-83] 78 (12/05 0420) Resp:  [16-20] 16 (12/05 0420) BP: (96-128)/(53-84) 102/68 (12/05 0700) SpO2:  [94 %-100 %] 94 % (12/05 0420) Weight:  [86.9 kg-87.4 kg] 86.9 kg (12/05 0500) Last BM Date : 07/24/22 Mean arterial Pressure 70s-80s   Intake/Output:   Intake/Output Summary (Last 24 hours) at 07/25/2022 0838 Last data filed at 07/25/2022 0800 Gross per 24 hour  Intake 954 ml  Output 2500 ml  Net -1546 ml     Physical Exam  Physical Exam: General:  Well appearing. No resp difficulty HEENT: Normal Neck: supple. JVP ~14 . Carotids 2+ bilat; no bruits. No lymphadenopathy or thyromegaly appreciated. Cor: Mechanical heart sounds with LVAD hum present. Post op incision healing well Lungs: Clear Abdomen: soft, nontender, nondistended. No hepatosplenomegaly. No bruits or masses. Good bowel sounds. Driveline: C/D/I; securement device intact and driveline incorporated Extremities: no cyanosis, clubbing, rash, edema Neuro: alert & orientedx3, cranial nerves grossly intact. moves all 4 extremities w/o difficulty. Affect pleasant   Labs   Basic Metabolic Panel: Recent Labs  Lab 07/20/22 0345 07/21/22 0310 07/22/22 0313 07/24/22 0430 07/25/22 0415  NA 133* 134* 133* 133* 132*  K 4.1 4.3 3.9 3.9 3.7  CL 92* 90* 90* 91* 92*  CO2 28 27 30 29 28   GLUCOSE 140* 138* 133* 130* 89  BUN 27* 33* 32* 28* 26*  CREATININE 1.40* 1.18 1.36* 1.35* 1.37*  CALCIUM 9.2 9.8 9.4 8.8* 8.9  MG 2.3   --  2.2  --   --     Liver Function Tests: Recent Labs  Lab 07/22/22 0313  AST 30  ALT 63*  ALKPHOS 223*  BILITOT 0.4  PROT 7.3  ALBUMIN 2.6*   No results for input(s): "LIPASE", "AMYLASE" in the last 168 hours. No results for input(s): "AMMONIA" in the last 168 hours.  CBC: Recent Labs  Lab 07/19/22 0413 07/21/22 0310 07/22/22 0313 07/24/22 0430 07/25/22 0415  WBC 9.6 10.7* 8.8 9.9 9.5  NEUTROABS  --   --  6.4  --   --   HGB 8.2* 9.1* 8.4* 8.3* 8.4*  HCT 25.6* 28.5* 26.9* 26.0* 26.5*  MCV 86.2 85.3 86.2 85.5 85.2  PLT 568* 629* 607* 616* 609*    INR: Recent Labs  Lab 07/21/22 0310 07/22/22 0313 07/23/22 0439 07/24/22 0430 07/25/22 0415  INR 1.9* 1.9* 2.0* 2.0* 2.1*    Other results: EKG:    Imaging   No results found.   Medications:     Scheduled Medications:  amiodarone  200 mg Oral BID   aspirin  81 mg Oral Daily   atorvastatin  20 mg Oral Daily   calcium-vitamin D  1 tablet Oral Q breakfast   Chlorhexidine Gluconate Cloth  6 each Topical Daily   digoxin  0.125 mg Oral Daily   empagliflozin  10 mg Oral Daily   Fe Fum-Vit C-Vit B12-FA  1 capsule Oral QPC  breakfast   insulin aspart  0-15 Units Subcutaneous TID WC   insulin aspart  0-5 Units Subcutaneous QHS   insulin aspart  5 Units Subcutaneous TID WC   insulin glargine-yfgn  22 Units Subcutaneous QHS   insulin glargine-yfgn  25 Units Subcutaneous Daily   levothyroxine  25 mcg Oral Q0600   magnesium oxide  400 mg Oral BID   melatonin  3 mg Oral QHS   multivitamin with minerals  1 tablet Oral Daily   pantoprazole  40 mg Oral Daily   polyethylene glycol  17 g Oral BID   senna-docusate  1 tablet Oral BID   sertraline  50 mg Oral Daily   sildenafil  20 mg Oral TID   sodium chloride flush  10-40 mL Intracatheter Q12H   spironolactone  25 mg Oral Daily   torsemide  40 mg Oral BID   warfarin  4 mg Oral ONCE-1600   Warfarin - Pharmacist Dosing Inpatient   Does not apply q1600     Infusions:  sodium chloride     sodium chloride      PRN Medications: sodium chloride, acetaminophen, albuterol, alum & mag hydroxide-simeth, diphenhydrAMINE, guaiFENesin-dextromethorphan, lidocaine, methocarbamol, mouth rinse, oxyCODONE, polyethylene glycol, prochlorperazine **OR** prochlorperazine **OR** prochlorperazine, sodium phosphate, sorbitol, traZODone    Assessment/Plan:    1. Acute on chronic systolic CHF/cardiogenic shock: Nonischemic cardiomyopathy, diagnosed 2020.  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  Cath in 12/20 with no significant coronary disease.  [employer] ICD. Most recent echo in 2/23 showed EF 20-25% with normal RV.  Recently, patient has been symptomatically worse, NYHA class IV with profound orthopnea. RHC showed markedly elevated filling pressures, primarily pulmonary venous hypertension, low cardiac output, and low PAPI.  Patient was admitted for inotrope and diuresis. Echo this admission with EF <20%, mod RV dysfunction.  He was started on milrinone with improvement in hemodynamics.  We proceeded with HM3 LVAD with LAA clip on 11/14.  He is now off milrinone and remains on sildenafil 20 tid.   - Maps stable.  - Volume status appears elevated this am. Continue torsemide 40 mg bid +2.5 metolazone today - Continue sildenafil 20 tid.  - Continue 25 mg spiro daily.  - Continue digoxin 0.125 daily - Continue Jardiance 10 mg daily - Renal function stable.  2. A fib RVR, post-op: H/O S/p flutter ablation.  Remains in NSR.  - Continue po amiodarone 200 BID, decrease to daily at discharge.  - Continue warfarin.  3. VAD management: s/p HM-3 VAD on 11/14.  Bedside echo 11/18 LV small, VAD cannula well aligned, AoV opening every beat, septum pulling to left RV moderately HK, small to  moderate anterior effusion. Ramp echo 11/22 RV mild to mod HK. Speed turned to 5400. Follow LDH daily. MAP 80s generally.  - Now on warfarin with INR 2 - LDH stable.  - Continue ASA 81. - VAD interrogated personally. Parameters stable. 4. H/o DVT: On anticoagulation.  5. Acute hypoxemic respiratory failure, post-op: Resolved.  On room air  Continue incentive spirometry. - Encourage mobility.  - Completed 1 week of Zosyn.  - Now off oxygen, tolerate sats 90% and above.  6. AKI: Creatinine stable at 1.37.  - Diuretic management as above - Needs daily BMET.  7. ID: Afebrile, WBCs 13. He completed initial vanc/cefepime empiric  abx for ?post-op PNA. 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 CCM switched to zosyn given persistent infiltrates on imaging and he completed 1 week of Zosyn.  Now afebrile.  8. Hypothyroidism - On levothyroxine.  9. Hypokalemia - Stable.  - Continue spironolactone.  10. Epistaxis: s/p cauterization/packing 11/22, packing now removed.  Recurrent last night, has now stopped. L nare packed.  - May need to place on antibiotics until packing removed.  11. Anemia: Hgb 8.4 yesterday, would continue daily for now.   12. Hyponatremia: Na 133. Fluid restrict.  4. 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   For now he can't shower. Needs to allow ongoing driveline incorporation.   I reviewed the LVAD parameters from today, and compared the results to the patient's prior recorded data.  No programming changes were made.  The LVAD is functioning within specified parameters.  The patient performs LVAD self-test daily.  LVAD interrogation was negative for any significant power changes, alarms or PI events/speed drops.  LVAD equipment check completed and is in good working order.  Back-up equipment present.   LVAD education done on emergency procedures and precautions and reviewed exit site care.  Length of Stay: [place], NP 07/25/2022, 8:38  AM  VAD Team --- VAD ISSUES ONLY--- Pager [phone] (7am - 7am)  Advanced Heart Failure Team  Pager [phone] (M-F; 7a - 5p)  Please contact [facility] for night-coverage after hours (5p -7a ) and weekends on [email or web address]  Agree with NP note.   Doing well with rehab, creatinine stable at 1.37.    General: Well appearing this am. NAD.  HEENT: Normal. Neck: Supple, JVP 8 cm. Carotids OK.  Cardiac:  Mechanical heart sounds with LVAD hum present.  Lungs:  CTAB, normal effort.  Abdomen:  NT, ND, no HSM. No bruits or masses. +BS  LVAD exit site: Well-healed and incorporated. Dressing dry and intact. No erythema or drainage. Stabilization device present and accurately applied. Driveline dressing changed daily per sterile technique. Extremities:  Warm and dry. No cyanosis, clubbing, rash, or edema.  Neuro:  Alert & oriented x 3. Cranial nerves grossly intact. Moves all 4 extremities w/o difficulty. Affect pleasant    LVAD parameters stable.  Would make no medication changes today.   Loralie Champagne 07/25/2022

## 2022-07-25 NOTE — Progress Notes (Signed)
Patient ID: Darryl Frye, male   DOB: 20-May-1961, 61 y.o.   MRN: [id number]  Met with pt who is tired from therapies to give team conference update with goals of mod/I level and target discharge date of 12/11. He feels he is doing well but needs to build his strength up. Now plans to go to [facility] for one week and then return to his home. Discussed the therapy team recommends cardiac rehab and will see if HF can make referral for this at [facility] since closer to his home. Have ordered rollator and tb bench which he will need at discharge. Continue to follow to assist with discharge needs.

## 2022-07-25 NOTE — Progress Notes (Signed)
Occupational Therapy Session Note  Patient Details  Name: Darryl Frye MRN: [id number] Date of Birth: 1961-06-27  Today's Date: 07/25/2022 OT Individual Time: 0917-1015 1st Session, 1415-1530 2nd session  OT Individual Time Calculation (min): 58 min, 75 min    Short Term Goals: Week 1:  OT Short Term Goal 1 (Week 1): Pt will complete sink side bathing and dressing with CGA following sternal precautions OT Short Term Goal 2 (Week 1): Pt will transfer to toilet and shower bench with close S OT Short Term Goal 3 (Week 1): Pt will identiffy 3 energy conservation strategies for ADL's OT Short Term Goal 4 (Week 1): Pt will rate am self care routine using BORG scale <12-14 overall demo improved activity tolerance  Skilled Therapeutic Interventions/Progress Updates:  1st Session:  Pt awaiting OT at EOB for skilled OT session. Pt agreeable to sink side and trial in bathroom on toilet with rollator use in preparation for discharge planning. Pt feels he will not require a BSC as LVAD lines will reach his bathroom at home and he can take his urinals home. Pt amb in and out of bathroom from bed to sink side with rollator with CGA. Off and on toilet with unilateral push off (pt has sink to assist at home) with CGA. Seated on rollator seat, pt completed oral and hair care as well as UB sponge bathing and pull over shirt dressing with set up only. Use of urinal for voiding. See data on Flowsheets. Rests required throughout session for each task and BORG overall 13 (somewhat hard) and O2 sats on RA between 92-96%. Pt then amb to recliner and set up with pillow on seat and back and trng on operating for LE elevation and recline to encourage OOB. Pt left in recliner with all safety needs in place.    2nd Session:   Pt resting bed level upon OT arrival. SpO2 sats 98% at rest on RA. Pt agreeable to all activity presented and OT plan for written UE HEP to ensure pt continues to work on carryover between sessions.  Pt issued yellow tband for triceps press 10 reps Bly with demo and cues. Pt stood for 4 rounds of UE restorator with no resistance and seated rests in standard chair. Forward and backward directions with RR up to 22 then down to 16-18 with breathing integration training. OT training for scap retraction and sh elevation 10 reps each in standing with seated rests. Obtained rollator basket for light item transport and educated on strategies for energy conservation upon return home. Rests required throughout session for each task and BORG overall 13 (somewhat hard) and O2 sats on RA between 92-98%. Written program provided for in room and home carryover. Pt transferred back to bed as per pt request with all safety needs in place.   Therapy Documentation Precautions:  Precautions Precautions: Fall, Sternal Precaution Booklet Issued: No Precaution Comments: LVAD, watch SpO2- 3L oxygen on inital eval, but weaned to 2L via [place] with SpO2 >90% Restrictions Weight Bearing Restrictions: Yes RUE Weight Bearing: Non weight bearing LUE Weight Bearing: Non weight bearing Other Position/Activity Restrictions: UE sternal precautions    Therapy/Group: Individual Therapy  Darryl Frye 07/25/2022, 7:49 AM

## 2022-07-25 NOTE — Progress Notes (Signed)
ANTICOAGULATION CONSULT NOTE - Follow Up Consult  Pharmacy Consult for Warfarin Indication:  LVAD and atrial fibrillation  No Known Allergies  Patient Measurements: Height: 5\' 11"  (180.3 cm) Weight: 86.9 kg (191 lb 9.3 oz) IBW/kg (Calculated) : 75.3  Vital Signs: Temp: 99.3 F (37.4 C) (12/05 0420) Temp Source: Oral (12/05 0016) BP: 100/81 (12/05 0420) Pulse Rate: 78 (12/05 0420)  Labs: Recent Labs    07/23/22 0439 07/24/22 0430 07/25/22 0415  HGB  --  8.3* 8.4*  HCT  --  26.0* 26.5*  PLT  --  616* 609*  LABPROT 22.8* 22.7* 23.2*  INR 2.0* 2.0* 2.1*  CREATININE  --  1.35* 1.37*     Estimated Creatinine Clearance: 61.8 mL/min (A) (by C-G formula based on SCr of 1.37 mg/dL (H)).  Assessment: 14 yom admitted with HF now s/p HM3 LVAD with LAA clip on 11/14. Was on apixaban PTA for hx Aflutter. Warfarin started on 11/17, previously managed by [facility]. Pharmacy consulted to manage warfarin dosing starting 11/30.   INR remains therapeutic at 2.1, CBC and LDH stable. Will attempt to get stable dosing regimen this week.  Goal of Therapy:  INR 2.2-.5 Monitor platelets by anticoagulation protocol: Yes   Plan:  -Warfarin 4mg  again PO x1 -Daily INR  [name], PharmD, BCPS, [facility] Clinical Pharmacist [phone] Please check AMION for all [facility] Pharmacy numbers 07/25/2022

## 2022-07-25 NOTE — Progress Notes (Addendum)
LVAD Coordinator Rounding Note:   HM 3 LVAD implanted on 07/04/22 by Dr Tenny Craw under destination criteria due to uncontrolled diabetes. Left atrial appendage clipped at time of surgery.   Pt lying in bed resting this morning following PT session. States he is tired today and notes his progress through therapy sessions. Pt states he is comfortable handling VAD equipment and reports no issues.   VAD discharge teaching completed with pt and caregiver. See separate note for documentation. Most home equipment has been delivered awaiting home batteries.   ICD therapy back on. Pt has decreased sensing now that VAD has been placed.    Vital signs: Temp: 99.3 HR: 78 NSR Doppler Pressure: 84 Arterial BP: 100/81(89) O2 Sat: 94% RA Wt: 214.3>208.8>209.2>207.2>206.6>205.3>200>197.1>195.9>198.4>197.1>189.3>190.4>190.9 >192.7>191.6lb   LVAD interrogation reveals:  Speed: 5400 Flow: 4.9 Power: 4.0 w PI: 3.0   Alarms: none Events: 5-10 PI daily Hematocrit: 28   Fixed speed: 5400 Low speed limit: 5100     Drive Line:  Dressing CDI. Drive line anchor applied correctly. Advanced dressing changes to twice weekly. Next dressing change 07/27/22 to be performed by nurse champion, [place] coordinator, or trained caregiver-Jessica.   Labs:  LDH trend: 326>400>462>422>387>388>368>354>243>245>228>227>247>197>202   INR trend: 1.1>1.5>1.4>1.5>3.0>2.4>2.2>2.2>2.2>1.9>1.9>1.7>1.9>2.0>2.1   WBC trend: 12.6>18>20.9>17.4>11.5>11.0>15.1>16.4>13.2>11.7>9.6>10.7>9.9>9.5   Anticoagulation Plan: -INR Goal: 2.0 - 2.5 -ASA Dose: 81mg     Blood Products:  IntraOp 11/14:  - 4 FFP  - DDAVP - 617 cell saver   PostOp 11/14: - 2 FFP   07/17/22: - 1 PRBC   Device: -Medtronic -Therapies: on 07/20/22 VF > 214 BPM--ATP during charging VT > OFF   Arrythmias: AFIB RVR 11/17. Amiodarone transitioned to p.o. 07/12/22   Respiratory: Extubated 07/06/22   Renal:  -BUN:25>18>20>26>27>25>17>25>29>26>27>28>26    -CRT:1.28>1.23>1.44>1.46>1.47>1.27>1.1>1.34>1.28>1.11>1.4>1.18>1.35>1.37   Infection: 07/07/22>> blood cultures>> no growth 5 days; final 07/07/22>> expectorated sputum>>moderate capnocutophaga; final (On Zosyn x 1 week)     Pt Education: Completed discharge education with [name] and [name]. See separate note for documentation.  [name] checked off to perform dressing changes independently.                                             Plan/Recommendations:  1. Page VAD coordinator for equipment or drive line issues. 2. Twice weekly drive line dressing changes per VAD coordinator, nurse champion, or trained caregiver-Jessica.   Bobbye Morton RN,BSN [place] Coordinator  Office: [phone]  24/7 Pager: [phone]

## 2022-07-25 NOTE — Plan of Care (Signed)
  Problem: Consults Goal: RH GENERAL PATIENT EDUCATION Description: See Patient Education module for education specifics. Outcome: Progressing   Problem: RH SKIN INTEGRITY Goal: RH STG SKIN FREE OF INFECTION/BREAKDOWN Description: Skin will be free of infection/breakdown with min assist Outcome: Progressing Goal: RH STG MAINTAIN SKIN INTEGRITY WITH ASSISTANCE Description: STG Maintain Skin Integrity With min Assistance. Outcome: Progressing   Problem: RH SKIN INTEGRITY Goal: RH STG MAINTAIN SKIN INTEGRITY WITH ASSISTANCE Description: STG Maintain Skin Integrity With min Assistance. Outcome: Progressing

## 2022-07-26 LAB — GLUCOSE, CAPILLARY
Glucose-Capillary: 72 mg/dL (ref 70–99)
Glucose-Capillary: 70 mg/dL (ref 70–99)
Glucose-Capillary: 178 mg/dL — ABNORMAL HIGH (ref 70–99)
Glucose-Capillary: 203 mg/dL — ABNORMAL HIGH (ref 70–99)

## 2022-07-26 LAB — BASIC METABOLIC PANEL
Anion gap: 14 (ref 5–15)
BUN: 28 mg/dL — ABNORMAL HIGH (ref 6–20)
CO2: 28 mmol/L (ref 22–32)
Calcium: 9.1 mg/dL (ref 8.9–10.3)
Chloride: 90 mmol/L — ABNORMAL LOW (ref 98–111)
Creatinine, Ser: 1.51 mg/dL — ABNORMAL HIGH (ref 0.61–1.24)
GFR, Estimated: 53 mL/min — ABNORMAL LOW (ref >60)
Glucose, Bld: 77 mg/dL (ref 70–99)
Potassium: 3.3 mmol/L — ABNORMAL LOW (ref 3.5–5.1)
Sodium: 132 mmol/L — ABNORMAL LOW (ref 135–145)

## 2022-07-26 LAB — CBC
HCT: 29.5 % — ABNORMAL LOW (ref 39.0–52.0)
Hemoglobin: 9 g/dL — ABNORMAL LOW (ref 13.0–17.0)
MCH: 26.3 pg (ref 26.0–34.0)
MCHC: 30.5 g/dL (ref 30.0–36.0)
MCV: 86.3 fL (ref 80.0–100.0)
Platelets: 602 10*3/uL — ABNORMAL HIGH (ref 150–400)
RBC: 3.42 MIL/uL — ABNORMAL LOW (ref 4.22–5.81)
RDW: 17.3 % — ABNORMAL HIGH (ref 11.5–15.5)
WBC: 8.5 10*3/uL (ref 4.0–10.5)
nRBC: 0 % (ref 0.0–0.2)

## 2022-07-26 LAB — LACTATE DEHYDROGENASE: LDH: 203 U/L — ABNORMAL HIGH (ref 98–192)

## 2022-07-26 LAB — PROTIME-INR
INR: 2.2 — ABNORMAL HIGH (ref 0.8–1.2)
Prothrombin Time: 24.6 seconds — ABNORMAL HIGH (ref 11.4–15.2)

## 2022-07-26 MED ORDER — INSULIN ASPART 100 UNIT/ML IJ SOLN
4.0000 [IU] | Freq: Three times a day (TID) | INTRAMUSCULAR | Status: DC
  Administered 2022-07-26 – 2022-07-31 (×10): 4 [IU] via SUBCUTANEOUS

## 2022-07-26 MED ORDER — TORSEMIDE 20 MG PO TABS
40.0000 mg | ORAL_TABLET | Freq: Every day | ORAL | Status: DC
  Administered 2022-07-27 – 2022-07-28 (×2): 40 mg via ORAL
  Filled 2022-07-26 (×2): qty 2

## 2022-07-26 MED ORDER — WARFARIN SODIUM 4 MG PO TABS
4.0000 mg | ORAL_TABLET | Freq: Once | ORAL | Status: AC
  Administered 2022-07-26: 4 mg via ORAL
  Filled 2022-07-26 (×2): qty 1

## 2022-07-26 MED ORDER — POTASSIUM CHLORIDE CRYS ER 20 MEQ PO TBCR
40.0000 meq | EXTENDED_RELEASE_TABLET | Freq: Once | ORAL | Status: AC
  Administered 2022-07-26: 40 meq via ORAL
  Filled 2022-07-26: qty 2

## 2022-07-26 NOTE — Progress Notes (Signed)
Patient sitting up in bed. Requested an additional cup of ice water. Denies felling dizzy at this time.

## 2022-07-26 NOTE — Progress Notes (Signed)
Physical Therapy Session Note  Patient Details  Name: Darryl Frye MRN: [id number] Date of Birth: [birth year]  Today's Date: 07/26/2022 PT Individual Time: 7026-3785, 1120-1201 PT Individual Time Calculation (min): 47 min, 41 min   Short Term Goals: Week 1:  PT Short Term Goal 1 (Week 1): Patient will ambulate >50' with LRAD and CGA PT Short Term Goal 2 (Week 1): Patient will ascend/descend x4 steps with CGA PT Short Term Goal 3 (Week 1): Patient will continue to demonstrate proper understanding of HM3/Batteries  Skilled Therapeutic Interventions/Progress Updates:      Therapy Documentation Precautions:  Precautions Precautions: Fall, Sternal Precaution Booklet Issued: No Precaution Comments: LVAD, watch SpO2- 3L oxygen on inital eval, but weaned to 2L via [place] with SpO2 >90% Restrictions Weight Bearing Restrictions: Yes RUE Weight Bearing: Non weight bearing LUE Weight Bearing: Non weight bearing Other Position/Activity Restrictions: UE sternal precautions  Treatment Session 1:  Pt agreeable to PT session with emphasis on gait training and declines pain. Pt states last night he "passed out" sitting edge of bed after using the bathroom and felt dizzy. Pt did not report syncopal episode and PT notified nursing. Pt attempted gait training and ambulated ~35 feet with rollator with CGA and  pt reported onset of dizziness. BP recorded as 82/59 (64) and within baseline according to nurse. Nurse plans to follow up with MD and check pt's H&H. Pt returned to room and performed self-care tasks at sink seated in w/c. Pt returned to bed and session terminated early due to fatigue and dizziness.   Treatment Session 2:  Pt agreeable to PT session and reports improvement in symptoms. Pt supervision with bed mobility and connecting to battery from wall. Pt requires SBA with stand pivot to w/c and transported to main gym for time management and energy conservation. Pt performed Kinetron seated in w/c  at 40 cm/seconds 7 x 1 minute bouts with intermittent rest breaks. Pt transported to room and left seated edge of bed in care of nursing.     Therapy/Group: Individual Therapy  Verl Dicker Verl Dicker PT, DPT  07/26/2022, 7:35 AM

## 2022-07-26 NOTE — Progress Notes (Signed)
PROGRESS NOTE   Subjective/Complaints: Feeling dizzy, discussed with HF team and torsemide has been decreased to once per day. Cr increased to 1.5, weight down to 6 lbs, Hgb improved  ROS: Patient denies fever, rash, sore throat, blurred vision, dizziness, nausea, vomiting, diarrhea, cough, shortness of breath or chest pain, joint or back/neck pain, headache, or mood change.    Objective:   No results found. Recent Labs    07/25/22 0415 07/26/22 0301  WBC 9.5 8.5  HGB 8.4* 9.0*  HCT 26.5* 29.5*  PLT 609* 602*   Recent Labs    07/25/22 0415 07/26/22 0301  NA 132* 132*  K 3.7 3.3*  CL 92* 90*  CO2 28 28  GLUCOSE 89 77  BUN 26* 28*  CREATININE 1.37* 1.51*  CALCIUM 8.9 9.1    Intake/Output Summary (Last 24 hours) at 07/26/2022 1130 Last data filed at 07/26/2022 1052 Gross per 24 hour  Intake 594 ml  Output 3100 ml  Net -2506 ml        Physical Exam: Vital Signs Blood pressure 106/74, pulse 79, temperature 97.9 F (36.6 C), temperature source Oral, resp. rate 17, height 5\' 11"  (1.803 m), weight 84.1 kg, SpO2 97 %.  Constitutional: No distress . Vital signs reviewed. BMI 25.86 HEENT: NCAT, EOMI, oral membranes moist Neck: supple Cardiovascular: LVAD hum    Respiratory/Chest: CTA Bilaterally without wheezes or rales. Normal effort    GI/Abdomen: BS +, non-tender, non-distended Ext: no clubbing, cyanosis, or edema Psych: pleasant and cooperative  Skin: Clean and intact without signs of breakdown. Line sites intact Neuro:  Alert and oriented x 3. Normal insight and awareness. Intact Memory. Normal language and speech. Cranial nerve exam unremarkable. Motor 4-5/5. Sensory exam normal for light touch and pain in all 4 limbs. No limb ataxia or cerebellar signs. No abnormal tone appreciated.   Musculoskeletal: some limitations d/t LVAD but normal rom for the most part.    Assessment/Plan: 1. Functional deficits  which require 3+ hours per day of interdisciplinary therapy in a comprehensive inpatient rehab setting. Physiatrist is providing close team supervision and 24 hour management of active medical problems listed below. Physiatrist and rehab team continue to assess barriers to discharge/monitor patient progress toward functional and medical goals  Care Tool:  Bathing    Body parts bathed by patient: Right arm, Left arm, Chest, Abdomen, Front perineal area, Face, Left upper leg   Body parts bathed by helper: Right lower leg, Left lower leg, Buttocks     Bathing assist Assist Level: Moderate Assistance - Patient 50 - 74%     Upper Body Dressing/Undressing Upper body dressing   What is the patient wearing?: Pull over shirt    Upper body assist Assist Level: Contact Guard/Touching assist    Lower Body Dressing/Undressing Lower body dressing      What is the patient wearing?: Pants     Lower body assist Assist for lower body dressing: Minimal Assistance - Patient > 75%     Toileting Toileting    Toileting assist Assist for toileting: Contact Guard/Touching assist     Transfers Chair/bed transfer  Transfers assist     Chair/bed transfer assist level:  Minimal Assistance - Patient > 75%     Locomotion Ambulation   Ambulation assist      Assist level: Minimal Assistance - Patient > 75% Assistive device: Hand held assist Max distance: 20'   Walk 10 feet activity   Assist     Assist level: Minimal Assistance - Patient > 75% Assistive device: Hand held assist   Walk 50 feet activity   Assist Walk 50 feet with 2 turns activity did not occur: Safety/medical concerns (Unable to ambulate >20' at this time secondary to impaired endurance/activity tolerance)         Walk 150 feet activity   Assist Walk 150 feet activity did not occur: Safety/medical concerns         Walk 10 feet on uneven surface  activity   Assist     Assist level: Minimal  Assistance - Patient > 75% Assistive device: Hand held assist   Wheelchair     Assist Is the patient using a wheelchair?: Yes Type of Wheelchair: Manual Wheelchair activity did not occur: Safety/medical concerns (Unable to perform wheelchair mobility at this time secondary to sternal precautions)         Wheelchair 50 feet with 2 turns activity    Assist    Wheelchair 50 feet with 2 turns activity did not occur: Safety/medical concerns       Wheelchair 150 feet activity     Assist  Wheelchair 150 feet activity did not occur: Safety/medical concerns       Blood pressure 106/74, pulse 79, temperature 97.9 F (36.6 C), temperature source Oral, resp. rate 17, height 5\' 11"  (1.803 m), weight 84.1 kg, SpO2 97 %.  Medical Problem List and Plan: 1. Functional deficits secondary to debility secondary to acute on chronic heart failure, NICM, cardiogenic shock status post LVAD.             -patient may shower             -ELOS/Goals: 5-7 days          -Continue CIR therapies including PT and OT. Interdisciplinary team conference today to discuss goals, barriers to discharge, and dc planning.   2.  Antithrombotics: -DVT/anticoagulation:  Pharmaceutical: Coumadin             -antiplatelet therapy: Aspirin 81 mg daily 3. Pain Management: Tylenol, oxycodone as needed, Lidoderm patch as needed 4. Depression: continue magnesium oxide.              -continue Zoloft 50 mg daily             -continue melatonin 3 mg nightly             -antipsychotic agents: n/a 5. Neuropsych/cognition: This patient is capable of making decisions on his own behalf. 6. Skin/Wound Care: Routine skin care checks             -patient's caregiver, [name], trained on every other day driveline dressing changes per VAD coordinator   7. Fluids/Electrolytes/Nutrition: Strict I's and O's and follow-up chemistries             -2 g sodium diet/1500 mL fluid restriction             -continue iron/vitamin  supplements/mag ox             -intake is solid presently   8: Acute on chronic HFrEF/nonischemic cardiomyopathy status post LVAD             -Continue spironolactone 25 mg  daily             -Continue digoxin 0.125 mg daily        -Continue torsemide 40 mg twice daily -Continue warfarin 3 mg q 1600 hrs. Continue pharmacy consult/INR -Continue sildenafil 20 mg TID -Continue Jardiance 10 mg daily -Continue magnesium oxide 400 mg twice daily -Page VAD coordinator for equipment or driveline issues Filed Weights   07/24/22 1632 07/25/22 0500 07/26/22 0358  Weight: 87.4 kg 86.9 kg 84.1 kg   -volme mgt per LVAD team   9: Paroxysmal atrial fibrillation/flutter status post ablation -continue amiodarone 200 mg twice daily -Continue warfarin 3 mg q 1600 hrs.   10: Hospital acquired pneumonia Zosyn>>completed 7 days             -wean supplemental oxygen as able             -pulmonary hygiene   11: Hyponatremia: improved; follow-up BMP             -May eventually need tolvaptan and if persists when euvolemic per cardiology   12: Diabetes mellitus, type II: CBGs before every meal and nightly  CBG (last 3)  Recent Labs    07/25/22 1640 07/25/22 2040 07/26/22 0603  GLUCAP 117* 239* 72    -12/5 improved control             -continue Jardiance 10 mg daily             -  Semglee  28 units every morning and   22 units nightly             -Decrease NovoLog to 4 units 3 times daily with meals             -Sliding scale insulin             -mag level 2.2 13: Acute kidney injury: creatinine worsening, will check with heart failure team if we can liberalize his fluid restriction to 1846mL. Decrease Toresemide to 40mg  daily.  14: History of DVT: now on warfarin 15: Hypothyroidism: Continue Synthroid 16: Anemia: Status post 1 unit PRBCs; follow-up CBC             -Left epistaxis status post cauterization, resolved  -no further re-bleeding after afrin/nasal packing  -12/5 ocean nasal spray  ordered qid 17: Hyperlipidemia: Continue Lipitor 20 mg daily 18: GI prophylaxis: Continue Protonix 40 mg daily 19: Partial DNR code status: no chest compressions 20. Screening for vitamin D deficiency:   -vitamin d level in low normal range -started vitamin D supplement 22. Hypocalcemia: calcium supplement started 23. Dizziness: likely secondary to dehydration, torsemide decreased to 40mg  daily.   LOS: 5 days A FACE TO FACE EVALUATION WAS PERFORMED  Doraine Schexnider P Katina Remick 07/26/2022, 11:30 AM

## 2022-07-26 NOTE — Progress Notes (Signed)
Occupational Therapy Session Note  Patient Details  Name: Darryl Frye MRN: [id number] Date of Birth: [birth year]  Today's Date: 07/26/2022 OT Individual Time: 0915-1030 1st Session; 1345-1441 2nd Session  OT Individual Time Calculation (min): 75 min, 56 min    Short Term Goals: Week 1:  OT Short Term Goal 1 (Week 1): Pt will complete sink side bathing and dressing with CGA following sternal precautions OT Short Term Goal 2 (Week 1): Pt will transfer to toilet and shower bench with close S OT Short Term Goal 3 (Week 1): Pt will identiffy 3 energy conservation strategies for ADL's OT Short Term Goal 4 (Week 1): Pt will rate am self care routine using BORG scale <12-14 overall demo improved activity tolerance  Skilled Therapeutic Interventions/Progress Updates:   1st Session:  Pt resting in bed upon OT arrival. Episodes of lightheadedness and low BP prior this am and last night. Medical team reported LVAD device with no issues but nursing reported weight loss and potential dehydration and episode of lightheadedness and low BP with PT in session previous. Both nursing and OT felt safest to perform session in room. BP 88/60, HR 82, RR 18, SpO2 98% at EOB. No pain but pt reports weakness, fatigue and intermittent SOB. OT instructed further in B UE HEP, energy conservation and breathing strategies. Pt able to complete 2 sets of tricep press with yellow tband supine level then 2 sets of 1 lb bar cane ex for scap, chest, elbows and forearms. Incentive spirometer use for 5 minute interval. Updated written HEP. Rollator and TTB delivered and OT adjusted rollator to highest setting as pt 5'11". Educated on use for seated rests and folding for transport. HR 82, RR 18, SpO2 97% at end of session. Left pt bed level with alarm set, needs and call button for nursing in reach.     2nd Session:   Pt seen for skilled OT session this pm. Pt reports feeling "much better" this session and was seated on EOB upon  therapist arrival. Baseline HR 82 BP106/58 RR 16 SpO2 98% on RA. Pt was able to prepare bck up battery pack bag nd place on back of w/c and disconnect from main LVAD to battery pack independently. Pt transferred bed to w/c with S. Transported to far ortho gym via w/c for energy conservation. Pt completed 4 sets of 2 minute intervals forward and 2 sets of 4 intervals in reverse on SCIFit UE restorator seated level on L1. Min cues for pacing and breathing integration. Prolonged rest breaks between sets. Once back to unit, pt able to amb with rollator from doorway to bed with S. EOB sitting to reconnect to main LVAD. VSR: HR 92 BP 108/60 RR 20 SpO2 96% BORG 13.  Left pt bed level with alarm set, needs and call button for nursing in reach.    Therapy Documentation Precautions:  Precautions Precautions: Fall, Sternal Precaution Booklet Issued: No Precaution Comments: LVAD, watch SpO2- 3L oxygen on inital eval, but weaned to 2L via [place] with SpO2 >90% Restrictions Weight Bearing Restrictions: Yes RUE Weight Bearing: Non weight bearing LUE Weight Bearing: Non weight bearing Other Position/Activity Restrictions: UE sternal precautions    Therapy/Group: Individual Therapy  [name] 07/26/2022, 9:54 AM

## 2022-07-26 NOTE — Progress Notes (Signed)
LVAD Coordinator Rounding Note:   HM 3 LVAD implanted on 07/04/22 by Dr Tenny Craw under destination criteria due to uncontrolled diabetes. Left atrial appendage clipped at time of surgery.   Pt lying in bed resting this morning following PT session. Requesting new anchor as previous anchor pulling away from skin. States he is comfortable handling VAD equipment and reports no issues. Denies complaints.  Pt MAY NOT shower until drive line healed and provided shower bag from VAD coordinators. Pt verbalized understanding. Discussed with PT at bedside.    VAD discharge teaching completed with pt and caregiver. See separate note for documentation. Most home equipment has been delivered awaiting home batteries.   ICD therapy back on. Pt has decreased sensing now that VAD has been placed.    Vital signs: Temp: 99.3 HR: 79 NSR Doppler Pressure: 106 Arterial BP: 106/74 (78) O2 Sat: 94% RA Wt: 214.3>208.8>209.2>207.2>206.6>205.3>200>197.1>195.9>198.4>197.1>189.3>190.4>190.9 >192.7>191.6>185.4 lb   LVAD interrogation reveals:  Speed: 5400 Flow: 4.9 Power: 4.1 w PI: 3.1   Alarms: none Events: none Hematocrit: 28   Fixed speed: 5400 Low speed limit: 5100     Drive Line:  Dressing CDI. Drive line anchor applied correctly. Advanced dressing changes to twice weekly. Next dressing change 07/27/22 to be performed by nurse champion, [place] coordinator, or trained caregiver-Jessica.   Labs:  LDH trend: 326>400>462>422>387>388>368>354>243>245>228>227>247>197>202>203   INR trend: 1.1>1.5>1.4>1.5>3.0>2.4>2.2>2.2>2.2>1.9>1.9>1.7>1.9>2.0>2.1>2.2   WBC trend: 12.6>18>20.9>17.4>11.5>11.0>15.1>16.4>13.2>11.7>9.6>10.7>9.9>9.5>8.5   Anticoagulation Plan: -INR Goal: 2.0 - 2.5 -ASA Dose: 81mg     Blood Products:  IntraOp 11/14:  - 4 FFP  - DDAVP - 617 cell saver   PostOp 11/14: - 2 FFP   07/17/22: - 1 PRBC   Device: -Medtronic -Therapies: on 07/20/22 VF > 214 BPM--ATP during charging VT > OFF    Arrythmias: AFIB RVR 11/17. Amiodarone transitioned to p.o. 07/12/22   Respiratory: Extubated 07/06/22   Renal:  -BUN:25>18>20>26>27>25>17>25>29>26>27>28>26>28   -CRT:1.28>1.23>1.44>1.46>1.47>1.27>1.1>1.34>1.28>1.11>1.4>1.18>1.35>1.37>1.51   Infection: 07/07/22>> blood cultures>> no growth 5 days; final 07/07/22>> expectorated sputum>>moderate capnocutophaga; final (On Zosyn x 1 week)     Pt Education: Completed discharge education with [name] and [name]. See separate note for documentation.  [name] checked off to perform dressing changes independently.                                             Plan/Recommendations:  1. Page VAD coordinator for equipment or drive line issues. 2. Twice weekly drive line dressing changes per VAD coordinator, nurse champion, or trained caregiver-Jessica.  Emerson Monte RN [place] Coordinator  Office: [phone]  24/7 Pager: [phone]

## 2022-07-26 NOTE — Progress Notes (Addendum)
Patient ID: Darryl Frye, male   DOB: 02-16-1961, 61 y.o.   MRN: [id number] Advanced Heart Failure VAD Team Note  PCP-Cardiologist: Carlyle Dolly, MD   Subjective:    Diuresed well yesterday with 40 mg Torsemide BID + 2.5 mg metolazone. Weight down 6 lb.  Complaining of increased dizziness today. Feels more thirsty.  Scr up slightly, 1.37>1.51.   MAP stable mostly 70s-80s  LVAD INTERROGATION:  HeartMate 3 LVAD:   Flow 4.9 liters/min, speed 5400, power 4.0, PI 3.0 3 PI events.    Objective:    Vital Signs:   Temp:  [97.9 F (36.6 C)-99.1 F (37.3 C)] 97.9 F (36.6 C) (12/06 0320) Pulse Rate:  [79-86] 79 (12/06 0539) Resp:  [15-17] 17 (12/06 0320) BP: (88-106)/(66-82) 106/74 (12/06 0700) SpO2:  [93 %-100 %] 97 % (12/06 0539) Weight:  [84.1 kg] 84.1 kg (12/06 0358) Last BM Date : 07/25/22 Mean arterial Pressure 70s-80s  Intake/Output:   Intake/Output Summary (Last 24 hours) at 07/26/2022 0843 Last data filed at 07/26/2022 0728 Gross per 24 hour  Intake 712 ml  Output 2700 ml  Net -1988 ml     Physical Exam   Physical Exam: GENERAL: Well appearing, sitting up on side of bed HEENT: normal  NECK: Supple, JVP ~ 8 cm.  2+ bilaterally, no bruits.   CARDIAC:  Mechanical heart sounds with LVAD hum present.  LUNGS:  Clear to auscultation bilaterally.  ABDOMEN:  Soft, round, nontender, positive bowel sounds x4.     LVAD exit site: well-healed and incorporated.  Dressing dry and intact.  Stabilization device present and accurately applied.   EXTREMITIES:  Warm and dry, no cyanosis, clubbing, rash or edema  NEUROLOGIC:  Alert and oriented x 4.  Affect pleasant.      Labs   Basic Metabolic Panel: Recent Labs  Lab 07/20/22 0345 07/21/22 0310 07/22/22 0313 07/24/22 0430 07/25/22 0415 07/26/22 0301  NA 133* 134* 133* 133* 132* 132*  K 4.1 4.3 3.9 3.9 3.7 3.3*  CL 92* 90* 90* 91* 92* 90*  CO2 28 27 30 29 28 28   GLUCOSE 140* 138* 133* 130* 89 77  BUN 27* 33* 32*  28* 26* 28*  CREATININE 1.40* 1.18 1.36* 1.35* 1.37* 1.51*  CALCIUM 9.2 9.8 9.4 8.8* 8.9 9.1  MG 2.3  --  2.2  --   --   --     Liver Function Tests: Recent Labs  Lab 07/22/22 0313  AST 30  ALT 63*  ALKPHOS 223*  BILITOT 0.4  PROT 7.3  ALBUMIN 2.6*   No results for input(s): "LIPASE", "AMYLASE" in the last 168 hours. No results for input(s): "AMMONIA" in the last 168 hours.  CBC: Recent Labs  Lab 07/21/22 0310 07/22/22 0313 07/24/22 0430 07/25/22 0415 07/26/22 0301  WBC 10.7* 8.8 9.9 9.5 8.5  NEUTROABS  --  6.4  --   --   --   HGB 9.1* 8.4* 8.3* 8.4* 9.0*  HCT 28.5* 26.9* 26.0* 26.5* 29.5*  MCV 85.3 86.2 85.5 85.2 86.3  PLT 629* 607* 616* 609* 602*    INR: Recent Labs  Lab 07/22/22 0313 07/23/22 0439 07/24/22 0430 07/25/22 0415 07/26/22 0301  INR 1.9* 2.0* 2.0* 2.1* 2.2*    Other results: EKG:    Imaging   No results found.   Medications:     Scheduled Medications:  amiodarone  200 mg Oral BID   aspirin  81 mg Oral Daily   atorvastatin  20 mg Oral Daily  calcium-vitamin D  1 tablet Oral Q breakfast   Chlorhexidine Gluconate Cloth  6 each Topical Q12H   digoxin  0.125 mg Oral Daily   empagliflozin  10 mg Oral Daily   Fe Fum-Vit C-Vit B12-FA  1 capsule Oral QPC breakfast   insulin aspart  0-15 Units Subcutaneous TID WC   insulin aspart  0-5 Units Subcutaneous QHS   insulin aspart  5 Units Subcutaneous TID WC   insulin glargine-yfgn  22 Units Subcutaneous QHS   insulin glargine-yfgn  25 Units Subcutaneous Daily   levothyroxine  25 mcg Oral Q0600   magnesium oxide  400 mg Oral BID   melatonin  3 mg Oral QHS   multivitamin with minerals  1 tablet Oral Daily   pantoprazole  40 mg Oral Daily   polyethylene glycol  17 g Oral BID   senna-docusate  1 tablet Oral BID   sertraline  50 mg Oral Daily   sildenafil  20 mg Oral TID   sodium chloride  2 spray Each Nare QID   sodium chloride flush  10-40 mL Intracatheter Q12H   spironolactone  25 mg  Oral Daily   torsemide  40 mg Oral BID   warfarin  4 mg Oral ONCE-1600   Warfarin - Pharmacist Dosing Inpatient   Does not apply q1600    Infusions:  sodium chloride     sodium chloride      PRN Medications: sodium chloride, acetaminophen, albuterol, alum & mag hydroxide-simeth, diphenhydrAMINE, guaiFENesin-dextromethorphan, lidocaine, methocarbamol, mouth rinse, oxyCODONE, polyethylene glycol, prochlorperazine **OR** prochlorperazine **OR** prochlorperazine, sodium phosphate, sorbitol, traZODone    Assessment/Plan:    1. Acute on chronic systolic CHF/cardiogenic shock: Nonischemic cardiomyopathy, diagnosed 2020.  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  Cath in 12/20 with no significant coronary disease.  Medtronic ICD. Most recent echo in 2/23 showed EF 20-25% with normal RV.  Recently, patient has been symptomatically worse, NYHA class IV with profound orthopnea. RHC showed markedly elevated filling pressures, primarily pulmonary venous hypertension, low cardiac output, and low PAPI.  Patient was admitted for inotrope and diuresis. Echo this admission with EF <20%, mod RV dysfunction.  He was started on milrinone with improvement in hemodynamics.  We proceeded with HM3 LVAD with LAA clip on 11/14.  He is now off milrinone and remains on sildenafil 20 tid.   - Maps stable.  - Volume okay on exam. Weight down 6 lb and complaining of more dizziness today. Scr up slightly to 1.5. Already received 40 mg Torsemide this am. Will reduce Torsemide to 40 mg daily and hold off on further metolazone. - Can increase fluid intake today. - Continue sildenafil 20 tid.  - Continue 25 mg spiro daily.  - Continue digoxin 0.125 daily - Continue Jardiance 10 mg daily - Check BMET tomorrow am. 2. A fib RVR, post-op: H/O S/p  flutter ablation.  Regular rhythm on exam. - Continue po amiodarone 200 BID, decrease to daily at discharge.  - Continue warfarin.  3. VAD management: s/p HM-3 VAD on 11/14.  Bedside echo 11/18 LV small, VAD cannula well aligned, AoV opening every beat, septum pulling to left RV moderately HK, small to moderate anterior effusion. Ramp echo 11/22 RV mild to mod HK. Speed turned to 5400. Follow LDH daily. MAP 70s-80s generally.  - Now on warfarin  with INR 2 - LDH stable.  - Continue ASA 81. - VAD interrogated personally. Parameters stable. 4. H/o DVT: On anticoagulation.  5. Acute hypoxemic respiratory failure, post-op: Resolved.  On room air  Continue incentive spirometry. - Encourage mobility.  - Completed 1 week of Zosyn.  - Now off oxygen with stable sats. 6. AKI: Creatinine slightly up at 1.51.  - Suspect may have been overdiuresed. Diuretic management as above - Needs daily BMET.  7. ID: Afebrile, WBCs 13. He completed initial vanc/cefepime empiric abx for ?post-op PNA. 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 CCM switched to zosyn given persistent infiltrates on imaging and he completed 1 week of Zosyn.  Now afebrile.  8. Hypothyroidism - On levothyroxine.  9. Hypokalemia - K 3.3. Supp. - Continue spironolactone.  10. Epistaxis: s/p cauterization/packing 11/22, packing now removed.  Recurrent 12/03 and left nare packed. Packing has been removed. 11. Anemia: Hgb stable at 9.0 - Continue to follow 12. Hyponatremia: Na stable 132.  43. 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 Need to allow for ongoing driveline incorporation.   I reviewed the LVAD parameters from today, and compared the results to the patient's prior recorded data.  No programming changes were made.  The LVAD is functioning within specified parameters.  The patient performs LVAD self-test daily.  LVAD interrogation was negative for any significant power changes,  alarms or PI events/speed drops.  LVAD equipment check completed and is in good working order.  Back-up equipment present.   LVAD education done on emergency procedures and precautions and reviewed exit site care.  Length of Stay: 5  FINCH, LINDSAY N, PA-C 07/26/2022, 8:43 AM  VAD Team --- VAD ISSUES ONLY--- Pager [phone] (7am - 7am)  Advanced Heart Failure Team  Pager [phone] (M-F; 7a - 5p)  Please contact [facility] for night-coverage after hours (5p -7a ) and weekends on [email or web address]  Patient seen with PA, agree with the above note.   Extensive diuresis yesterday.  He felt lightheaded this morning, creatinine up to 1.5.   Breathing improving, using IS.   General: Well appearing this am. NAD.  HEENT: Normal. Neck: Supple, JVP 7-8 cm. Carotids OK.  Cardiac:  Mechanical heart sounds with LVAD hum present.  Lungs:  CTAB, normal effort.  Abdomen:  NT, ND, no HSM. No bruits or masses. +BS  LVAD exit site: Well-healed and incorporated. Dressing dry and intact. No erythema or drainage. Stabilization device present and accurately applied. Driveline dressing changed daily per sterile technique. Extremities:  Warm and dry. No cyanosis, clubbing, rash, or edema.  Neuro:  Alert & oriented x 3. Cranial nerves grossly intact. Moves all 4 extremities w/o difficulty. Affect pleasant    Hold further torsemide today and decrease to 40 mg daily.  No more metolazone.    Continue PT.   Loralie Champagne 07/26/2022 10:35 AM

## 2022-07-26 NOTE — Progress Notes (Signed)
ANTICOAGULATION CONSULT NOTE - Follow Up Consult  Pharmacy Consult for Warfarin Indication:  LVAD and atrial fibrillation  No Known Allergies  Patient Measurements: Height: 5\' 11"  (180.3 cm) Weight: 84.1 kg (185 lb 6.4 oz) IBW/kg (Calculated) : 75.3  Vital Signs: Temp: 97.9 F (36.6 C) (12/06 0320) Temp Source: Oral (12/06 0320) BP: 92/69 (12/06 0539) Pulse Rate: 79 (12/06 0539)  Labs: Recent Labs    07/24/22 0430 07/25/22 0415 07/26/22 0301  HGB 8.3* 8.4* 9.0*  HCT 26.0* 26.5* 29.5*  PLT 616* 609* 602*  LABPROT 22.7* 23.2* 24.6*  INR 2.0* 2.1* 2.2*  CREATININE 1.35* 1.37* 1.51*     Estimated Creatinine Clearance: 56.1 mL/min (A) (by C-G formula based on SCr of 1.51 mg/dL (H)).  Assessment: 89 yom admitted with HF now s/p HM3 LVAD with LAA clip on 11/14. Was on apixaban PTA for hx Aflutter. Warfarin started on 11/17, previously managed by [facility]. Pharmacy consulted to manage warfarin dosing starting 11/30.   INR remains therapeutic at 2.2, CBC and LDH stable. Will attempt to get stable dosing regimen this week.  Goal of Therapy:  INR 2.2-.5 Monitor platelets by anticoagulation protocol: Yes   Plan:  -Warfarin 4mg  again PO x1 -Daily INR  Darryl Frye, PharmD, BCPS, [facility] Clinical Pharmacist [phone] Please check AMION for all [facility] numbers 07/26/2022

## 2022-07-27 DIAGNOSIS — I5043 Acute on chronic combined systolic (congestive) and diastolic (congestive) heart failure: Secondary | ICD-10-CM

## 2022-07-27 LAB — BASIC METABOLIC PANEL
Anion gap: 13 (ref 5–15)
BUN: 31 mg/dL — ABNORMAL HIGH (ref 6–20)
CO2: 28 mmol/L (ref 22–32)
Calcium: 9.2 mg/dL (ref 8.9–10.3)
Chloride: 88 mmol/L — ABNORMAL LOW (ref 98–111)
Creatinine, Ser: 1.46 mg/dL — ABNORMAL HIGH (ref 0.61–1.24)
GFR, Estimated: 55 mL/min — ABNORMAL LOW (ref >60)
Glucose, Bld: 100 mg/dL — ABNORMAL HIGH (ref 70–99)
Potassium: 3.5 mmol/L (ref 3.5–5.1)
Sodium: 129 mmol/L — ABNORMAL LOW (ref 135–145)

## 2022-07-27 LAB — GLUCOSE, CAPILLARY
Glucose-Capillary: 105 mg/dL — ABNORMAL HIGH (ref 70–99)
Glucose-Capillary: 112 mg/dL — ABNORMAL HIGH (ref 70–99)
Glucose-Capillary: 152 mg/dL — ABNORMAL HIGH (ref 70–99)
Glucose-Capillary: 121 mg/dL — ABNORMAL HIGH (ref 70–99)

## 2022-07-27 LAB — LACTATE DEHYDROGENASE: LDH: 198 U/L — ABNORMAL HIGH (ref 98–192)

## 2022-07-27 LAB — CBC
HCT: 29.3 % — ABNORMAL LOW (ref 39.0–52.0)
Hemoglobin: 9.5 g/dL — ABNORMAL LOW (ref 13.0–17.0)
MCH: 27.4 pg (ref 26.0–34.0)
MCHC: 32.4 g/dL (ref 30.0–36.0)
MCV: 84.4 fL (ref 80.0–100.0)
Platelets: 581 10*3/uL — ABNORMAL HIGH (ref 150–400)
RBC: 3.47 MIL/uL — ABNORMAL LOW (ref 4.22–5.81)
RDW: 17.2 % — ABNORMAL HIGH (ref 11.5–15.5)
WBC: 8.5 10*3/uL (ref 4.0–10.5)
nRBC: 0 % (ref 0.0–0.2)

## 2022-07-27 LAB — PROTIME-INR
INR: 2.3 — ABNORMAL HIGH (ref 0.8–1.2)
Prothrombin Time: 25.3 seconds — ABNORMAL HIGH (ref 11.4–15.2)

## 2022-07-27 MED ORDER — OXYMETAZOLINE HCL 0.05 % NA SOLN
1.0000 | Freq: Two times a day (BID) | NASAL | Status: AC
  Administered 2022-07-27 – 2022-07-28 (×3): 1 via NASAL
  Filled 2022-07-27: qty 30

## 2022-07-27 MED ORDER — POTASSIUM CHLORIDE CRYS ER 20 MEQ PO TBCR
40.0000 meq | EXTENDED_RELEASE_TABLET | Freq: Once | ORAL | Status: AC
  Administered 2022-07-27: 40 meq via ORAL
  Filled 2022-07-27: qty 2

## 2022-07-27 MED ORDER — WARFARIN SODIUM 4 MG PO TABS
4.0000 mg | ORAL_TABLET | Freq: Every day | ORAL | Status: AC
  Administered 2022-07-27: 4 mg via ORAL
  Filled 2022-07-27: qty 1

## 2022-07-27 MED ORDER — OXYCODONE HCL 5 MG PO TABS: 5.0000 mg | ORAL_TABLET | Freq: Three times a day (TID) | ORAL | Status: DC | PRN

## 2022-07-27 NOTE — Progress Notes (Signed)
Physical Therapy Session Note  Patient Details  Name: Darryl Frye MRN: [id number] Date of Birth: Oct 28, 1960  Today's Date: 07/27/2022 PT Individual Time: 1025-1134 PT Individual Time Calculation (min): 69 min   Short Term Goals: Week 1:  PT Short Term Goal 1 (Week 1): Patient will ambulate >50' with LRAD and CGA PT Short Term Goal 2 (Week 1): Patient will ascend/descend x4 steps with CGA PT Short Term Goal 3 (Week 1): Patient will continue to demonstrate proper understanding of HM3/Batteries  Skilled Therapeutic Interventions/Progress Updates:    Pt received supine in bed and agreeable to therapy session. Pt received and maintained on room air throughout session - pt states he is not feeling as SOB as he was in the beginning of his CIR stay - unable to get pulse ox reading on portable nor dynamap during session therefore based activity on symptoms with pt requiring frequent seated rest breaks.  Pt donned shoes set-up assist.  Supine>sitting L EOB, mod-I. Pt able to transition LVAD from wall power to batteries with set-up assist and no cuing needed. Pt recalls need to have extra batteries, clips, and controller on him at all times - therapist brought the bag with pt then educated pt on him using rollator bag to hold his LVAD back-up bag.  Reports sudden urge to use bathroom. Sit<>stand using UE support on bed or surrounding items and managed LB clothing and continent of bladder in urinal without assistance - charted - close supervision/CGA for balance safety with pt noted to have slight anterior/posterior postural sway with ankle strategy. Standing at sink performed hand hygiene with close supervision.   Gait training all the way to the main therapy gym with only 1x seated rest break, near nurses station, on rollator (therapist bringing w/c), pt demos safe use of pushing AD up against a wall and locking the brakes prior to sitting, without cuing - adequate gait speed with reciprocal stepping  pattern slight forward trunk flexion onto AD but not significant.   Gait training to stairs using rollator with close supervision.   Stair navigation training ascending/descending 4 steps (6" height) using B HRs, while maintaining sternal precautions, with CGA - step-to pattern leading with L LE on ascent and varying LE on descent - CGA progressing to close supervision for safety.  Transported to/from ortho gym in w/c.  Therapist provided visual demonstration and verbal education on proper sequencing of car transfer. Pt performed simulated ambulatory car transfer (sedan height) using rollator with close supervision for safety and pt demoing excellent technique.   Pt requesting to attempt to walk back to room to address his impaired endurance.   Gait training ~63ft + 26ft (seated break between) using rollator with close supervision and pt demoing same gait as above - due to fatigue, pt unable to ambulate remainder of distance requiring transport back to room in w/c.  Short distance ~61ft ambulatory transfer w/c>EOB, no AD, with CGA for steadying. Pt able to transfer LVAD from battery to wall power with set-up assistance and no cuing. Pt left seated on EOB with needs in reach, lines intact, and bed alarm on.    Therapy Documentation Precautions:  Precautions Precautions: Fall, Sternal Precaution Booklet Issued: No Precaution Comments: LVAD, watch SpO2- 3L oxygen on inital eval, but weaned to 2L via [place] with SpO2 >90% Restrictions Weight Bearing Restrictions: Yes RUE Weight Bearing: Non weight bearing LUE Weight Bearing: Non weight bearing Other Position/Activity Restrictions: UE sternal precautions   Pain:  Denies pain during session.  Therapy/Group: Individual Therapy  Tawana Scale , PT, DPT, NCS, CSRS 07/27/2022, 10:23 AM

## 2022-07-27 NOTE — Progress Notes (Signed)
Physical Therapy Session Note  Patient Details  Name: Darryl Frye MRN: [id number] Date of Birth: 1960-12-01  Today's Date: 07/27/2022 PT Individual Time: 0803-0915 PT Individual Time Calculation (min): 72 min   Short Term Goals: Week 1:  PT Short Term Goal 1 (Week 1): Patient will ambulate >50' with LRAD and CGA PT Short Term Goal 2 (Week 1): Patient will ascend/descend x4 steps with CGA PT Short Term Goal 3 (Week 1): Patient will continue to demonstrate proper understanding of HM3/Batteries  Skilled Therapeutic Interventions/Progress Updates:      Therapy Documentation Precautions:  Precautions Precautions: Fall, Sternal Precaution Booklet Issued: No Precaution Comments: LVAD, watch SpO2- 3L oxygen on inital eval, but weaned to 2L via [place] with SpO2 >90% Restrictions Weight Bearing Restrictions: Yes RUE Weight Bearing: Non weight bearing LUE Weight Bearing: Non weight bearing Other Position/Activity Restrictions: UE sternal precautions  Pt agreeable to PT session with emphasis on improving transfers, static/dynamic standing balance, and activity tolerance. Pt (S) for safety for LVAD management. Pt (S) for bed mobility and stand pivot to w/c. Pt performed self-care tasks at sink with set-up assistance. Nurse present for med pass and vitals assessment. Pt transported by w/c total A for energy conservation. Pt requires close (S) for dynamic standing balance and able to tolerate standing for ~1 min x 3. In standing pt performed contralteral reaching for bean bag and aimed for target. Pt CGA for gait x ~10 ft to pick up bean bags following activity. Pt transported to room and left semi-reclined in bed with all needs in reach and alarm on.    Therapy/Group: Individual Therapy  Verl Dicker Verl Dicker PT, DPT  07/27/2022, 7:28 AM

## 2022-07-27 NOTE — Progress Notes (Signed)
Occupational Therapy Discharge Summary  Patient Details  Name: Darryl Frye MRN: [id number] Date of Birth: 01-May-1961  Date of Discharge from [facility] service:July 31, 2022  Today's Date: 07/30/2022    Patient has met 9 of 9 Qu term goals due to improved activity tolerance, improved balance, and ability to compensate for deficits.  Patient to discharge at overall Modified Independent level.  Patient's care partner is independent to provide the necessary physical assistance at discharge.    Reasons goals not met: NA Recommendation:  Patient will benefit from ongoing skilled OT services in outpatient setting with cardiac rehab focus to continue to advance functional skills in the area of BADL, iADL, Vocation, and Reduce care partner burden.  Equipment: TTB, rollator, LHSponge (all in room already)  Reasons for discharge: treatment goals met  Patient/family agrees with progress made and goals achieved: Yes  OT Discharge  ADL ADL Eating: Independent Grooming: Modified independent Where Assessed-Grooming: Sitting at sink Upper Body Bathing: Modified independent Where Assessed-Upper Body Bathing: Sitting at sink Lower Body Bathing: Modified independent Where Assessed-Lower Body Bathing: Sitting at sink Upper Body Dressing: Modified independent (Device) Where Assessed-Upper Body Dressing: Sitting at sink Lower Body Dressing: Modified independent Where Assessed-Lower Body Dressing: Sitting at sink Toileting: Modified independent Where Assessed-Toileting: Glass blower/designer: Diplomatic Services operational officer Method: Human resources officer Method: Optometrist: Radio broadcast assistant ADL Comments: MODI with rollator for ADLS, pt continues to present with decreased activity tolerance impacting pts ability to complete ADLs independently Vision Baseline Vision/History: 1 Wears glasses (readers) Patient Visual Report: No change from baseline Vision  Assessment?: Yes Eye Alignment: Within Functional Limits Ocular Range of Motion: Within Functional Limits Tracking/Visual Pursuits: Able to track stimulus in all quads without difficulty Perception  Perception: Within Functional Limits Praxis Praxis: Intact Cognition Cognition Overall Cognitive Status: Within Functional Limits for tasks assessed Arousal/Alertness: Awake/alert Memory: Appears intact Awareness: Appears intact Problem Solving: Appears intact Safety/Judgment: Appears intact Brief Interview for Mental Status (BIMS) Repetition of Three Words (First Attempt): 3 Temporal Orientation: Year: Correct Temporal Orientation: Month: Accurate within 5 days Temporal Orientation: Day: Correct Recall: "Sock": Yes, no cue required Recall: "Blue": Yes, no cue required Recall: "Bed": Yes, no cue required BIMS Summary Score: 15 Sensation Sensation Light Touch: Appears Intact Peripheral sensation comments: dimished sensation B lower extremities distal to the knee (chronic ~3 yrs) Light Touch Impaired Details: Impaired RLE;Impaired LLE Hot/Cold: Appears Intact Proprioception: Appears Intact Stereognosis: Not tested Additional Comments: Patient reports numbness/tingling in B feeet up toward his calves- States this was present prior to hospitalization Coordination Gross Motor Movements are Fluid and Coordinated: Yes Fine Motor Movements are Fluid and Coordinated: Yes Heel Shin Test: mild dysmetria on R, slow and deliberat on L Motor  Motor Motor: Other (comment);Abnormal postural alignment and control Motor - Discharge Observations: Global deconditioning, reduced postural control Mobility  Bed Mobility Bed Mobility: Supine to Sit;Sit to Supine Rolling Right: Independent Rolling Left: Independent Supine to Sit: Independent Sit to Supine: Independent Transfers Sit to Stand: Independent with assistive device Stand to Sit: Independent with assistive device  Trunk/Postural  Assessment  Cervical Assessment Cervical Assessment: Within Functional Limits Thoracic Assessment Thoracic Assessment: Within Functional Limits Lumbar Assessment Lumbar Assessment: Within Functional Limits Postural Control Postural Control: Deficits on evaluation Righting Reactions: Delayed  Balance Balance Balance Assessed: Yes Static Sitting Balance Static Sitting - Balance Support: Feet supported Static Sitting - Level of Assistance: 6: Modified independent (Device/Increase time) Dynamic Sitting Balance Dynamic Sitting - Balance Support: Feet  supported;During functional activity Dynamic Sitting - Level of Assistance: 6: Modified independent (Device/Increase time) Static Standing Balance Static Standing - Balance Support: During functional activity;Right upper extremity supported Static Standing - Level of Assistance: 6: Modified independent (Device/Increase time) Dynamic Standing Balance Dynamic Standing - Balance Support: Right upper extremity supported;During functional activity Dynamic Standing - Level of Assistance: 6: Modified independent (Device/Increase time) Extremity/Trunk Assessment RUE Assessment RUE Assessment: Within Functional Limits LUE Assessment LUE Assessment: Within Functional Limits   Darryl Frye 07/27/2022, 7:46 AM

## 2022-07-27 NOTE — Progress Notes (Signed)
LVAD Coordinator Rounding Note:   HM 3 LVAD implanted on 07/04/22 by Dr Tenny Craw under destination criteria due to uncontrolled diabetes. Left atrial appendage clipped at time of surgery.   Pt lying in bed resting this morning following PT session. States he is comfortable handling VAD equipment and reports no issues. Denies complaints.  Remaining chest tube suture removed today. Site cleansed with betadine prior to removal. Scab intact. Also removed blue suture from drive line. See documentation below.   Pt MAY NOT shower until drive line healed and provided shower bag from VAD coordinators.    VAD discharge teaching completed with pt and caregiver. See separate note for documentation. Home equipment has been delivered.   ICD therapy back on. Pt has decreased sensing now that VAD has been placed.   Plan for possible discharge early next week. Has VAD clinic f/u scheduled 08/07/22 at 10:00.    Vital signs: Temp: 98.8 HR: 76 NSR Doppler Pressure:  Auto BP: 97/68 (81) O2 Sat: 98% RA Wt: 214.3>208.8>209.2>207.2>206.6>205.3>200>197.1>195.9>198.4>197.1>189.3>190.4>190.9 >192.7>191.6>185.4>183.5 lb   LVAD interrogation reveals:  Speed: 5400 Flow: 5.0 Power: 4.0 w PI: 2.9   Alarms: none Events: none Hematocrit: 29   Fixed speed: 5400 Low speed limit: 5100     Drive Line:  Dressing change performed by VAD coordinator. Existing VAD dressing removed and site care performed using sterile technique. Drive line exit site cleaned with Chlora prep applicators x 2, allowed to dry, and gauze dressing with silver strip applied. Exit site partially incorporated, the velour is fully implanted at exit site. 1 blue sutures, and 1 black suture in place. Blue suture removed today. Slight bruising at exit site. Scant amount of dried serous drainage on previous silver strip. No redness, tenderness, rash or foul odor. Drive line anchor re-applied. Continue dressing changes to twice weekly. Next dressing  change 07/31/22 to be performed by nurse champion, [place] coordinator, or trained caregiver-Jessica.  Labs:  LDH trend: 326>400>462>422>387>388>368>354>243>245>228>227>247>197>202>203>198   INR trend: 1.1>1.5>1.4>1.5>3.0>2.4>2.2>2.2>2.2>1.9>1.9>1.7>1.9>2.0>2.1>2.2>2.3   WBC trend: 12.6>18>20.9>17.4>11.5>11.0>15.1>16.4>13.2>11.7>9.6>10.7>9.9>9.5>8.5   Anticoagulation Plan: -INR Goal: 2.0 - 2.5 -ASA Dose: 81mg     Blood Products:  IntraOp 11/14:  - 4 FFP  - DDAVP - 617 cell saver   PostOp 11/14: - 2 FFP   07/17/22: - 1 PRBC   Device: -Medtronic -Therapies: on 07/20/22 VF > 214 BPM--ATP during charging VT > OFF   Arrythmias: AFIB RVR 11/17. Amiodarone transitioned to p.o. 07/12/22   Respiratory: Extubated 07/06/22   Renal:  -BUN:25>18>20>26>27>25>17>25>29>26>27>28>26>28>31   -CRT:1.28>1.23>1.44>1.46>1.47>1.27>1.1>1.34>1.28>1.11>1.4>1.18>1.35>1.37>1.51>1.46   Infection: 07/07/22>> blood cultures>> no growth 5 days; final 07/07/22>> expectorated sputum>>moderate capnocutophaga; final (On Zosyn x 1 week)     Pt Education: Completed discharge education with [name] and [name]. See separate note for documentation.  [name] checked off to perform dressing changes independently.                                             Plan/Recommendations:  1. Page VAD coordinator for equipment or drive line issues. 2. Twice weekly drive line dressing changes per VAD coordinator, nurse champion, or trained caregiver-Jessica.  Emerson Monte RN [place] Coordinator  Office: [phone]  24/7 Pager: [phone]

## 2022-07-27 NOTE — Plan of Care (Signed)
  Problem: Consults Goal: RH GENERAL PATIENT EDUCATION Description: See Patient Education module for education specifics. Outcome: Progressing   Problem: RH SKIN INTEGRITY Goal: RH STG SKIN FREE OF INFECTION/BREAKDOWN Description: Skin will be free of infection/breakdown with min assist Outcome: Progressing

## 2022-07-27 NOTE — Progress Notes (Signed)
PROGRESS NOTE   Subjective/Complaints: Nosebleed this morning, trying nasal spray Dizziness resolved with decrease in Torsemide, discussed this change with patient, that this medication was also contributing to his frequent urination  ROS: Patient denies fever, rash, sore throat, blurred vision, dizziness, nausea, vomiting, diarrhea, cough, shortness of breath or chest pain, joint or back/neck pain, headache, or mood change. +nosebleed   Objective:   No results found. Recent Labs    07/26/22 0301 07/27/22 0440  WBC 8.5 8.5  HGB 9.0* 9.5*  HCT 29.5* 29.3*  PLT 602* 581*   Recent Labs    07/26/22 0301 07/27/22 0440  NA 132* 129*  K 3.3* 3.5  CL 90* 88*  CO2 28 28  GLUCOSE 77 100*  BUN 28* 31*  CREATININE 1.51* 1.46*  CALCIUM 9.1 9.2    Intake/Output Summary (Last 24 hours) at 07/27/2022 1048 Last data filed at 07/27/2022 0900 Gross per 24 hour  Intake 976 ml  Output 2300 ml  Net -1324 ml        Physical Exam: Vital Signs Blood pressure 97/68, pulse 76, temperature 98.8 F (37.1 C), resp. rate 15, height 5\' 11"  (1.803 m), weight 83.2 kg, SpO2 98 %.  Constitutional: No distress . Vital signs reviewed. BMI 25.86 HEENT: NCAT, EOMI, oral membranes moist, +nosebleed Neck: supple Cardiovascular: LVAD hum    Respiratory/Chest: CTA Bilaterally without wheezes or rales. Normal effort    GI/Abdomen: BS +, non-tender, non-distended Ext: no clubbing, cyanosis, or edema Psych: pleasant and cooperative  Skin: Clean and intact without signs of breakdown. Line sites intact Neuro:  Alert and oriented x 3. Normal insight and awareness. Intact Memory. Normal language and speech. Cranial nerve exam unremarkable. Motor 4-5/5. Sensory exam normal for light touch and pain in all 4 limbs. No limb ataxia or cerebellar signs. No abnormal tone appreciated.   Musculoskeletal: some limitations d/t LVAD but normal rom for the most  part.    Assessment/Plan: 1. Functional deficits which require 3+ hours per day of interdisciplinary therapy in a comprehensive inpatient rehab setting. Physiatrist is providing close team supervision and 24 hour management of active medical problems listed below. Physiatrist and rehab team continue to assess barriers to discharge/monitor patient progress toward functional and medical goals  Care Tool:  Bathing    Body parts bathed by patient: Right arm, Left arm, Chest, Abdomen, Front perineal area, Face, Left upper leg   Body parts bathed by helper: Right lower leg, Left lower leg, Buttocks     Bathing assist Assist Level: Moderate Assistance - Patient 50 - 74%     Upper Body Dressing/Undressing Upper body dressing   What is the patient wearing?: Pull over shirt    Upper body assist Assist Level: Contact Guard/Touching assist    Lower Body Dressing/Undressing Lower body dressing      What is the patient wearing?: Pants     Lower body assist Assist for lower body dressing: Minimal Assistance - Patient > 75%     Toileting Toileting    Toileting assist Assist for toileting: Contact Guard/Touching assist     Transfers Chair/bed transfer  Transfers assist     Chair/bed transfer assist level: Minimal  Assistance - Patient > 75%     Locomotion Ambulation   Ambulation assist      Assist level: Minimal Assistance - Patient > 75% Assistive device: Hand held assist Max distance: 20'   Walk 10 feet activity   Assist     Assist level: Minimal Assistance - Patient > 75% Assistive device: Hand held assist   Walk 50 feet activity   Assist Walk 50 feet with 2 turns activity did not occur: Safety/medical concerns (Unable to ambulate >20' at this time secondary to impaired endurance/activity tolerance)         Walk 150 feet activity   Assist Walk 150 feet activity did not occur: Safety/medical concerns         Walk 10 feet on uneven surface   activity   Assist     Assist level: Minimal Assistance - Patient > 75% Assistive device: Hand held assist   Wheelchair     Assist Is the patient using a wheelchair?: Yes Type of Wheelchair: Manual Wheelchair activity did not occur: Safety/medical concerns (Unable to perform wheelchair mobility at this time secondary to sternal precautions)         Wheelchair 50 feet with 2 turns activity    Assist    Wheelchair 50 feet with 2 turns activity did not occur: Safety/medical concerns       Wheelchair 150 feet activity     Assist  Wheelchair 150 feet activity did not occur: Safety/medical concerns       Blood pressure 97/68, pulse 76, temperature 98.8 F (37.1 C), resp. rate 15, height 5\' 11"  (1.803 m), weight 83.2 kg, SpO2 98 %.  Medical Problem List and Plan: 1. Functional deficits secondary to debility secondary to acute on chronic heart failure, NICM, cardiogenic shock status post LVAD.             -patient may not shower to allow for ongoing driveline incorporation             -ELOS/Goals: 5-7 days          -Continue CIR therapies including PT and OT. Interdisciplinary team conference today to discuss goals, barriers to discharge, and dc planning.   2.  Afib with RVR: continue Coumadin dosing as per pharmacy             -antiplatelet therapy: Aspirin 81 mg daily 3. Pain Management: Tylenol, decrease oxycodone to 5mg  q8H as needed, Lidoderm patch as needed 4. Depression: continue magnesium oxide.              -continue Zoloft 50 mg daily             -continue melatonin 3 mg nightly             -antipsychotic agents: n/a 5. Neuropsych/cognition: This patient is capable of making decisions on his own behalf. 6. Skin/Wound Care: Routine skin care checks             -patient's caregiver, [name], trained on every other day driveline dressing changes per VAD coordinator   7. Fluids/Electrolytes/Nutrition: Strict I's and O's and follow-up chemistries              -2 g sodium diet/1500 mL fluid restriction             -continue iron/vitamin supplements/mag ox             -intake is solid presently   8: Acute on chronic HFrEF/nonischemic cardiomyopathy status post LVAD             -  Continue spironolactone 25 mg daily             -Continue digoxin 0.125 mg daily        -Continue torsemide 40 mg twice daily -Continue warfarin 3 mg q 1600 hrs. Continue pharmacy consult/INR -Continue sildenafil 20 mg TID -Continue Jardiance 10 mg daily -Continue magnesium oxide 400 mg twice daily -Page VAD coordinator for equipment or driveline issues Filed Weights   07/25/22 0500 07/26/22 0358 07/27/22 0500  Weight: 86.9 kg 84.1 kg 83.2 kg   -volme mgt per LVAD team   9: Paroxysmal atrial fibrillation/flutter status post ablation -continue amiodarone 200 mg twice daily -Continue warfarin 3 mg q 1600 hrs.   10: Hospital acquired pneumonia Zosyn>>completed 7 days             -wean supplemental oxygen as able             -pulmonary hygiene   11: Hyponatremia: improved; follow-up BMP             -May eventually need tolvaptan and if persists when euvolemic per cardiology   12: Diabetes mellitus, type II: CBGs before every meal and nightly  CBG (last 3)  Recent Labs    07/26/22 1651 07/26/22 2031 07/27/22 0618  GLUCAP 178* 203* 105*    -12/5 improved control             -continue Jardiance 10 mg daily             -  Semglee  28 units every morning and   22 units nightly             -Decrease NovoLog to 4 units 3 times daily with meals             -Sliding scale insulin             -mag level 2.2 13: Acute kidney injury: creatinine worsening, will check with heart failure team if we can liberalize his fluid restriction to 1848mL. Decrease Toresemide to 40mg  daily.  14: History of DVT: now on warfarin 15: Hypothyroidism: Continue Synthroid 16: Anemia: Status post 1 unit PRBCs; follow-up CBC             -Left epistaxis status post cauterization,  resolved  -no further re-bleeding after afrin/nasal packing  -12/5 ocean nasal spray ordered qid 17: Hyperlipidemia: Continue Lipitor 20 mg daily 18: GI prophylaxis: Continue Protonix 40 mg daily 19: Partial DNR code status: no chest compressions 20. Screening for vitamin D deficiency:   -vitamin d level in low normal range -started vitamin D supplement 22. Hypocalcemia: calcium supplement started 23. Dizziness: likely secondary to dehydration, torsemide decreased to 40mg  daily.   LOS: 6 days A FACE TO FACE EVALUATION WAS PERFORMED  Darryl Frye Darryl Frye 07/27/2022, 10:48 AM

## 2022-07-27 NOTE — Progress Notes (Signed)
ANTICOAGULATION CONSULT NOTE - Follow Up Consult  Pharmacy Consult for Warfarin Indication:  LVAD and atrial fibrillation  No Known Allergies  Patient Measurements: Height: 5\' 11"  (180.3 cm) Weight: 83.2 kg (183 lb 8 oz) IBW/kg (Calculated) : 75.3  Vital Signs: Temp: 98.8 F (37.1 C) (12/07 0426) Temp Source: Oral (12/07 0000) BP: 95/77 (12/07 0426) Pulse Rate: 76 (12/07 0426)  Labs: Recent Labs    07/25/22 0415 07/26/22 0301 07/27/22 0440  HGB 8.4* 9.0* 9.5*  HCT 26.5* 29.5* 29.3*  PLT 609* 602* 581*  LABPROT 23.2* 24.6* 25.3*  INR 2.1* 2.2* 2.3*  CREATININE 1.37* 1.51* 1.46*     Estimated Creatinine Clearance: 58 mL/min (A) (by C-G formula based on SCr of 1.46 mg/dL (H)).  Assessment: 78 yom admitted with HF now s/p HM3 LVAD with LAA clip on 11/14. Was on apixaban PTA for hx Aflutter. Warfarin started on 11/17, previously managed by TCTS. Pharmacy consulted to manage warfarin dosing starting 11/30.   INR remains therapeutic at 2.3, CBC and LDH stable. Will attempt to get stable dosing regimen this week prior to discharge.  Goal of Therapy:  INR 2.2-.5 Monitor platelets by anticoagulation protocol: Yes   Plan:  -Warfarin 4mg  daily for now -Daily INR  [name], PharmD, BCPS, [facility] Clinical Pharmacist [phone] Please check AMION for all [facility] Pharmacy numbers 07/27/2022

## 2022-07-27 NOTE — Plan of Care (Signed)
  Problem: RH Laundry Goal: LTG Patient will perform laundry w/assist, cues (OT) Description: LTG: Patient will perform laundry with assistance, with/without cues (OT). Outcome: Progressing   Problem: RH Light Housekeeping Goal: LTG Patient will perform light housekeeping w/assist (OT) Description: LTG: Patient will perform light housekeeping with assistance, with/without cues (OT). Outcome: Progressing   Problem: RH Toilet Transfers Goal: LTG Patient will perform toilet transfers w/assist (OT) Description: LTG: Patient will perform toilet transfers with assist, with/without cues using equipment (OT) Outcome: Progressing   Problem: RH Tub/Shower Transfers Goal: LTG Patient will perform tub/shower transfers w/assist (OT) Description: LTG: Patient will perform tub/shower transfers with assist, with/without cues using equipment (OT) Outcome: Progressing

## 2022-07-27 NOTE — Progress Notes (Addendum)
Patient ID: Darryl Frye, male   DOB: 26-Jan-1961, 61 y.o.   MRN: [id number] Advanced Heart Failure VAD Team Note  PCP-Cardiologist: Carlyle Dolly, MD   Subjective:    Diuresed well with Torsemide BID and 2.5 metolazone, may have over diuresed with bump in Cr. Now downtrending. 1.51>1.46. Torsemide now at 40 daily.   Feels good this morning, no dizziness. Small nose bleed this morning, used saline spray and resolved.   MAP stable mostly 70s-80s  LVAD INTERROGATION:  HeartMate 3 LVAD:   Flow 4.7 liters/min, speed 5400, power 4.1, PI 3.5 x1 PI events.    Objective:    Vital Signs:   Temp:  [98 F (36.7 C)-98.8 F (37.1 C)] 98.8 F (37.1 C) (12/07 0426) Pulse Rate:  [76-79] 76 (12/07 0426) Resp:  [15-17] 15 (12/07 0426) BP: (92-104)/(56-80) 95/77 (12/07 0426) SpO2:  [95 %-98 %] 98 % (12/07 0426) Weight:  [83.2 kg] 83.2 kg (12/07 0500) Last BM Date : 07/26/22 Mean arterial Pressure 70s-80s  Intake/Output:   Intake/Output Summary (Last 24 hours) at 07/27/2022 0907 Last data filed at 07/27/2022 0800 Gross per 24 hour  Intake 716 ml  Output 2300 ml  Net -1584 ml     Physical Exam  General:  Well appearing. No resp difficulty HEENT: Normal Neck: supple. JVP ~9. Carotids 2+ bilat; no bruits. No lymphadenopathy or thyromegaly appreciated. Cor: Mechanical heart sounds with LVAD hum present. Sternal incision healing Lungs: Clear Abdomen: soft, nontender, nondistended. No hepatosplenomegaly. No bruits or masses. Good bowel sounds. Driveline: C/D/I; securement device intact and driveline incorporated Extremities: no cyanosis, clubbing, rash, edema Neuro: alert & orientedx3, cranial nerves grossly intact. moves all 4 extremities w/o difficulty. Affect pleasant   Labs   Basic Metabolic Panel: Recent Labs  Lab 07/22/22 0313 07/24/22 0430 07/25/22 0415 07/26/22 0301 07/27/22 0440  NA 133* 133* 132* 132* 129*  K 3.9 3.9 3.7 3.3* 3.5  CL 90* 91* 92* 90* 88*  CO2 30 29 28  28 28   GLUCOSE 133* 130* 89 77 100*  BUN 32* 28* 26* 28* 31*  CREATININE 1.36* 1.35* 1.37* 1.51* 1.46*  CALCIUM 9.4 8.8* 8.9 9.1 9.2  MG 2.2  --   --   --   --     Liver Function Tests: Recent Labs  Lab 07/22/22 0313  AST 30  ALT 63*  ALKPHOS 223*  BILITOT 0.4  PROT 7.3  ALBUMIN 2.6*   No results for input(s): "LIPASE", "AMYLASE" in the last 168 hours. No results for input(s): "AMMONIA" in the last 168 hours.  CBC: Recent Labs  Lab 07/22/22 0313 07/24/22 0430 07/25/22 0415 07/26/22 0301 07/27/22 0440  WBC 8.8 9.9 9.5 8.5 8.5  NEUTROABS 6.4  --   --   --   --   HGB 8.4* 8.3* 8.4* 9.0* 9.5*  HCT 26.9* 26.0* 26.5* 29.5* 29.3*  MCV 86.2 85.5 85.2 86.3 84.4  PLT 607* 616* 609* 602* 581*    INR: Recent Labs  Lab 07/23/22 0439 07/24/22 0430 07/25/22 0415 07/26/22 0301 07/27/22 0440  INR 2.0* 2.0* 2.1* 2.2* 2.3*    Other results: EKG:    Imaging   No results found.   Medications:     Scheduled Medications:  amiodarone  200 mg Oral BID   aspirin  81 mg Oral Daily   atorvastatin  20 mg Oral Daily   calcium-vitamin D  1 tablet Oral Q breakfast   Chlorhexidine Gluconate Cloth  6 each Topical Q12H   digoxin  0.125 mg Oral Daily   empagliflozin  10 mg Oral Daily   Fe Fum-Vit C-Vit B12-FA  1 capsule Oral QPC breakfast   insulin aspart  0-15 Units Subcutaneous TID WC   insulin aspart  0-5 Units Subcutaneous QHS   insulin aspart  4 Units Subcutaneous TID WC   insulin glargine-yfgn  22 Units Subcutaneous QHS   insulin glargine-yfgn  25 Units Subcutaneous Daily   levothyroxine  25 mcg Oral Q0600   magnesium oxide  400 mg Oral BID   melatonin  3 mg Oral QHS   multivitamin with minerals  1 tablet Oral Daily   pantoprazole  40 mg Oral Daily   polyethylene glycol  17 g Oral BID   potassium chloride  40 mEq Oral Once   senna-docusate  1 tablet Oral BID   sertraline  50 mg Oral Daily   sildenafil  20 mg Oral TID   sodium chloride  2 spray Each Nare QID    sodium chloride flush  10-40 mL Intracatheter Q12H   spironolactone  25 mg Oral Daily   torsemide  40 mg Oral Daily   warfarin  4 mg Oral q1600   Warfarin - Pharmacist Dosing Inpatient   Does not apply q1600    Infusions:  sodium chloride     sodium chloride      PRN Medications: sodium chloride, acetaminophen, albuterol, alum & mag hydroxide-simeth, diphenhydrAMINE, guaiFENesin-dextromethorphan, lidocaine, methocarbamol, mouth rinse, oxyCODONE, polyethylene glycol, prochlorperazine **OR** prochlorperazine **OR** prochlorperazine, sodium phosphate, sorbitol, traZODone    Assessment/Plan:    1. Acute on chronic systolic CHF/cardiogenic shock: Nonischemic cardiomyopathy, diagnosed 2020.  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  Cath in 12/20 with no significant coronary disease.  Medtronic ICD. Most recent echo in 2/23 showed EF 20-25% with normal RV.  Recently, patient has been symptomatically worse, NYHA class IV with profound orthopnea. RHC showed markedly elevated filling pressures, primarily pulmonary venous hypertension, low cardiac output, and low PAPI.  Patient was admitted for inotrope and diuresis. Echo this admission with EF <20%, mod RV dysfunction.  He was started on milrinone with improvement in hemodynamics.  We proceeded with HM3 LVAD with LAA clip on 11/14.  He is now off milrinone and remains on sildenafil 20 tid.   - Maps stable.  - Volume stable on exam, continue 40 PO torsemide daily - Continue sildenafil 20 tid.  - Continue 25 mg spiro daily.  - Continue digoxin 0.125 daily - Continue Jardiance 10 mg daily 2. A fib RVR, post-op: H/O S/p flutter ablation.  Regular rhythm on exam. - Continue po amiodarone 200 BID, decrease to daily at discharge.  - Continue warfarin.  3. VAD management: s/p  HM-3 VAD on 11/14.  Bedside echo 11/18 LV small, VAD cannula well aligned, AoV opening every beat, septum pulling to left RV moderately HK, small to moderate anterior effusion. Ramp echo 11/22 RV mild to mod HK. Speed turned to 5400. Follow LDH daily. MAP 70s-80s generally.  - Now on warfarin with INR 2.3 - LDH stable.  - Continue ASA 81. - VAD interrogated personally. Parameters stable. 4. H/o DVT: On anticoagulation.  5. Acute hypoxemic respiratory failure, post-op: Resolved.  On room air  Continue incentive spirometry. - Encourage mobility.  - Completed 1 week of Zosyn.  - Now off oxygen with  stable sats. 6. AKI: Creatinine 1.51>1.46.  - Needs daily BMET.  7. ID: Afebrile, WBCs 13. He completed initial vanc/cefepime empiric abx for ?post-op PNA. 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 CCM switched to zosyn given persistent infiltrates on imaging and he completed 1 week of Zosyn.  Now afebrile.  8. Hypothyroidism - On levothyroxine.  9. Hypokalemia - K 3.5 Supp. - Continue spironolactone.  10. Epistaxis: s/p cauterization/packing 11/22, packing now removed.  Recurrent 12/03 and left nare packed. Packing has been removed. 11. Anemia: Hgb stable at 9.5 - Continue to follow 12. Hyponatremia: Na 129, will continue to monitor 13. 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 Need to allow for ongoing driveline incorporation.   I reviewed the LVAD parameters from today, and compared the results to the patient's prior recorded data.  No programming changes were made.  The LVAD is functioning within specified parameters.  The patient performs LVAD self-test daily.  LVAD interrogation was negative for any significant power changes, alarms or PI events/speed drops.  LVAD equipment check completed and is in good working order.  Back-up equipment present.   LVAD education done on emergency procedures and precautions and reviewed exit site  care.  Length of Stay: [place], NP 07/27/2022, 9:07 AM  VAD Team --- VAD ISSUES ONLY--- Pager [phone] (7am - 7am)  Advanced Heart Failure Team  Pager [phone] (M-F; 7a - 5p)  Please contact [facility] for night-coverage after hours (5p -7a ) and weekends on [email or web address]  Agree with the above NP note.   Stable today.  Creatinine lower at 1.46.    Continue torsemide at 40 mg daily.   Continue PT.   LVAD parameters stable.   Loralie Champagne 07/27/2022

## 2022-07-27 NOTE — Progress Notes (Signed)
Occupational Therapy Session Note  Patient Details  Name: Darryl Frye MRN: [id number] Date of Birth: 01/28/1961  Today's Date: 07/27/2022 OT Individual Time: 1430-1530 OT Individual Time Calculation (min): 60 min    Short Term Goals: Week 1:  OT Short Term Goal 1 (Week 1): Pt will complete sink side bathing and dressing with CGA following sternal precautions OT Short Term Goal 2 (Week 1): Pt will transfer to toilet and shower bench with close S OT Short Term Goal 3 (Week 1): Pt will identiffy 3 energy conservation strategies for ADL's OT Short Term Goal 4 (Week 1): Pt will rate am self care routine using BORG scale <12-14 overall demo improved activity tolerance  Skilled Therapeutic Interventions/Progress Updates:   Pt seen for skilled OT session this pm. Pt reports feeling "much better" today and participating in all PT sessions earlier. Pt moved from supine to sit and SpO2 98% HR 82 bpm. Changed self from main LVAD to batteries and obtained back up battery packs in bag indep. Stood to use urinal with mod I holding to LVAD cart and managing pajama bottoms. OT then set pt up with rollator and pt able to stand at sink with S for hand washing. Sat in w/c and for energy conservation OT transported pt to demo apt for TTB training. Transfer off and on including LVAD eq with dist s only. Educated on [employer] with curtain strategies as well as need to have cardiac out[pt MD clear for shower. Pt lready has TTB in room delivered and current rollator his set up for home. Pt instructed in energy conservation in kitchen and safe reaching strategies with + teach back. Once back in room, pt transferred w/c back to bed mod I holding to LVAD cart and reconnected to main LVAD indep. OT trained pt in yellow tputty therex and added bead retrieval for sustained activity between sessions and form home c/o. Pt provided with full written HEP. Pt with +VSR and pt left supine in bed with bed eit engaged, call  button and needs in reach.   Therapy Documentation Precautions:  Precautions Precautions: Fall, Sternal Precaution Booklet Issued: No Precaution Comments: LVAD, watch SpO2- 3L oxygen on inital eval, but weaned to 2L via [place] with SpO2 >90% Restrictions Weight Bearing Restrictions: Yes RUE Weight Bearing: Non weight bearing LUE Weight Bearing: Non weight bearing Other Position/Activity Restrictions: UE sternal precautions    Therapy/Group: Individual Therapy  [name] 07/27/2022, 7:42 AM

## 2022-07-27 NOTE — Evaluation (Signed)
Recreational Therapy Assessment and Plan  Patient Details  Name: Darryl Frye MRN: [id number] Date of Birth: Apr 16, 1961 Today's Date: 07/27/2022  Rehab Potential:  Good ELOS:   d/c 1211  Assessment    Hospital Problem: Principal Problem:   Heart failure ([place]) Active Problems:   Debility   Prerenal azotemia     Past Medical History:      Past Medical History:  Diagnosis Date   Acute kidney injury ([place])     Acute respiratory failure ([place])     Atrial flutter ([place])      on Eliquis   CHF (congestive heart failure) ([place])      EF 20-25% 10/15/21, AICD in place.   Coronary artery disease     Diabetes mellitus without complication ([facility])     DVT (deep venous thrombosis) ([place]) 06/2019   Heart murmur     Hypertension     Presence of permanent cardiac pacemaker     Renal disorder      Past Surgical History:       Past Surgical History:  Procedure Laterality Date   A-FLUTTER ABLATION N/A 01/12/2022    Procedure: A-FLUTTER ABLATION;  Surgeon: Evans Lance, MD;  Location: [facility];  Service: Cardiovascular;  Laterality: N/A;   COLONOSCOPY        Dr. Posey Pronto; One 4-6 mm hyperplastic rectal polyp, mid left-sided diverticulosis.   CYST REMOVAL NECK       HERNIA REPAIR       INSERTION OF IMPLANTABLE LEFT VENTRICULAR ASSIST DEVICE N/A 07/04/2022    Procedure: INSERTION OF IMPLANTABLE LEFT VENTRICULAR ASSIST DEVICE; ATRICLIP 49;  Surgeon: Neomia Glass, MD;  Location: [facility];  Service: Open Heart Surgery;  Laterality: N/A;   RIGHT HEART CATH N/A 06/27/2022    Procedure: RIGHT HEART CATH;  Surgeon: Larey Dresser, MD;  Location: [facility];  Service: Cardiovascular;  Laterality: N/A;   RIGHT HEART CATH N/A 06/29/2022    Procedure: RIGHT HEART CATH;  Surgeon: Larey Dresser, MD;  Location: [facility];  Service: Cardiovascular;  Laterality: N/A;   TEE WITHOUT CARDIOVERSION N/A 07/04/2022    Procedure: TRANSESOPHAGEAL ECHOCARDIOGRAM (TEE);  Surgeon: Neomia Glass, MD;  Location: [facility];  Service: Open Heart Surgery;  Laterality: N/A;   TONSILLECTOMY          Assessment & Plan Clinical Impression: Patient is a 61 year old male with a history of atrial fibrillation/flutter nonischemic cardiomyopathy who underwent right heart catheterization on 06/27/2022. He had presented with worsening symptoms, decompensated heart failure. This showed markedly elevated filling pressures, primarily pulmonary venous hypertension, low cardiac output and low PAPI. He was admitted for inotrope and diuresis. Started on milrinone and Lasix infusion. He was evaluated by Dr. Tenny Craw on 11/8 for LVAD. Eliquis was held and he was started on a heparin drip. He agreed to participate in the [facility] trial and signed the informed consent on 11/13. He underwent left ventricular assist device, TEE and left atrial appendage closure on 11/14. Admitted to ICU and critical care medicine consulted. He was able to be extubated on 11/16. Low-grade fever and leukocytosis and placed on vancomycin and cefepime. Developed A-fib with RVR responded well to amiodarone and back in normal sinus rhythm. Warfarin and aspirin started. Acute kidney injury improving. ENT consultation by Dr. Constance Holster obtained on 11/22 due to persistent leading from left nare. Left side was cauterized and packing placed. Last chest tube discontinued on 11/22. Antibiotics changed to  Zosyn given persistent infiltrates on chest x-ray and sputum culture positive for capnocytophaga. Diuresis continued. O2 requirement slowly proving. Dyspnea on exertion continued 11/24. Tolerating diet. Lasix infusion stopped 11/25. No further epistaxis and packing removed 11/27. Has PICC line right upper extremity. Foley catheter discontinued and he is voiding spontaneously. The patient completed discharge education from heart failure nursing team. The patient requires inpatient physical medicine and rehabilitation evaluations and treatment secondary to  dysfunction due to chronic congestive heart failure, cardiomyopathy, cardiogenic shock.  Pt presents with decreased activity tolerance, decreased functional mobility, decreased balance Limiting pt's independence with leisure/community pursuits.  Met with pt today to discuss TR services including leisure education, activity analysis/modifications and stress management.  Also discussed the importance of social, emotional, spiritual health in addition to physical health and their effects on overall health and wellness.  Pt stated understanding.  Pt is excited about upcoming discharge.   Plan  No further TR  Recommendations for other services: None   Discharge Criteria: Patient will be discharged from TR if patient refuses treatment 3 consecutive times without medical reason.  If treatment goals not met, if there is a change in medical status, if patient makes no progress towards goals or if patient is discharged from hospital.  The above assessment, treatment plan, treatment alternatives and goals were discussed and mutually agreed upon: by patient  [place] 07/27/2022, 8:31 AM

## 2022-07-28 DIAGNOSIS — F4321 Adjustment disorder with depressed mood: Secondary | ICD-10-CM

## 2022-07-28 LAB — BASIC METABOLIC PANEL
Anion gap: 14 (ref 5–15)
BUN: 34 mg/dL — ABNORMAL HIGH (ref 6–20)
CO2: 27 mmol/L (ref 22–32)
Calcium: 9 mg/dL (ref 8.9–10.3)
Chloride: 89 mmol/L — ABNORMAL LOW (ref 98–111)
Creatinine, Ser: 1.41 mg/dL — ABNORMAL HIGH (ref 0.61–1.24)
GFR, Estimated: 57 mL/min — ABNORMAL LOW (ref >60)
Glucose, Bld: 73 mg/dL (ref 70–99)
Potassium: 3.3 mmol/L — ABNORMAL LOW (ref 3.5–5.1)
Sodium: 130 mmol/L — ABNORMAL LOW (ref 135–145)

## 2022-07-28 LAB — LACTATE DEHYDROGENASE: LDH: 185 U/L (ref 98–192)

## 2022-07-28 LAB — CBC
HCT: 30.5 % — ABNORMAL LOW (ref 39.0–52.0)
Hemoglobin: 9.4 g/dL — ABNORMAL LOW (ref 13.0–17.0)
MCH: 26 pg (ref 26.0–34.0)
MCHC: 30.8 g/dL (ref 30.0–36.0)
MCV: 84.3 fL (ref 80.0–100.0)
Platelets: 603 10*3/uL — ABNORMAL HIGH (ref 150–400)
RBC: 3.62 MIL/uL — ABNORMAL LOW (ref 4.22–5.81)
RDW: 17.2 % — ABNORMAL HIGH (ref 11.5–15.5)
WBC: 7.6 10*3/uL (ref 4.0–10.5)
nRBC: 0 % (ref 0.0–0.2)

## 2022-07-28 LAB — GLUCOSE, CAPILLARY
Glucose-Capillary: 71 mg/dL (ref 70–99)
Glucose-Capillary: 152 mg/dL — ABNORMAL HIGH (ref 70–99)
Glucose-Capillary: 93 mg/dL (ref 70–99)
Glucose-Capillary: 152 mg/dL — ABNORMAL HIGH (ref 70–99)

## 2022-07-28 LAB — PROTIME-INR
INR: 2.4 — ABNORMAL HIGH (ref 0.8–1.2)
Prothrombin Time: 25.9 seconds — ABNORMAL HIGH (ref 11.4–15.2)

## 2022-07-28 MED ORDER — INSULIN GLARGINE-YFGN 100 UNIT/ML ~~LOC~~ SOLN: 13.0000 [IU] | Freq: Every day | SUBCUTANEOUS | Status: DC

## 2022-07-28 MED ORDER — AMIODARONE HCL 200 MG PO TABS
200.0000 mg | ORAL_TABLET | Freq: Every day | ORAL | Status: DC
  Administered 2022-07-29 – 2022-07-31 (×3): 200 mg via ORAL
  Filled 2022-07-28 (×3): qty 1

## 2022-07-28 MED ORDER — OXYCODONE HCL 5 MG PO TABS: 5.0000 mg | ORAL_TABLET | Freq: Two times a day (BID) | ORAL | Status: DC | PRN

## 2022-07-28 MED ORDER — INSULIN GLARGINE-YFGN 100 UNIT/ML ~~LOC~~ SOLN
13.0000 [IU] | Freq: Every day | SUBCUTANEOUS | Status: DC
  Administered 2022-07-28 – 2022-07-31 (×4): 13 [IU] via SUBCUTANEOUS
  Filled 2022-07-28 (×4): qty 0.13

## 2022-07-28 MED ORDER — TORSEMIDE 20 MG PO TABS
40.0000 mg | ORAL_TABLET | Freq: Every day | ORAL | Status: DC
  Administered 2022-07-30: 40 mg via ORAL
  Filled 2022-07-28 (×2): qty 2

## 2022-07-28 MED ORDER — WARFARIN SODIUM 4 MG PO TABS
4.0000 mg | ORAL_TABLET | Freq: Every day | ORAL | Status: DC
  Administered 2022-07-28: 4 mg via ORAL
  Filled 2022-07-28: qty 1

## 2022-07-28 MED ORDER — EMPAGLIFLOZIN 10 MG PO TABS
10.0000 mg | ORAL_TABLET | Freq: Every day | ORAL | Status: DC
  Administered 2022-07-30 – 2022-07-31 (×2): 10 mg via ORAL
  Filled 2022-07-28 (×2): qty 1

## 2022-07-28 MED ORDER — POTASSIUM CHLORIDE CRYS ER 20 MEQ PO TBCR
40.0000 meq | EXTENDED_RELEASE_TABLET | Freq: Once | ORAL | Status: AC
  Administered 2022-07-28: 40 meq via ORAL
  Filled 2022-07-28: qty 2

## 2022-07-28 MED ORDER — POTASSIUM CHLORIDE CRYS ER 20 MEQ PO TBCR
20.0000 meq | EXTENDED_RELEASE_TABLET | Freq: Every day | ORAL | Status: DC
  Administered 2022-07-29 – 2022-07-31 (×3): 20 meq via ORAL
  Filled 2022-07-28 (×4): qty 1

## 2022-07-28 NOTE — Progress Notes (Signed)
Occupational Therapy Session Note  Patient Details  Name: Darryl Frye MRN: [id number] Date of Birth: Jul 30, 1961  Today's Date: 07/28/2022 OT Individual Time: 1345-1450 OT Individual Time Calculation (min): 65 min    Short Term Goals: Week 1:  OT Short Term Goal 1 (Week 1): Pt will complete sink side bathing and dressing with CGA following sternal precautions OT Short Term Goal 2 (Week 1): Pt will transfer to toilet and shower bench with close S OT Short Term Goal 3 (Week 1): Pt will identiffy 3 energy conservation strategies for ADL's OT Short Term Goal 4 (Week 1): Pt will rate am self care routine using BORG scale <12-14 overall demo improved activity tolerance  Skilled Therapeutic Interventions/Progress Updates:    Upon OT arrival, pt semi recumbent in bed reporting fatigue but is agreeable to OT session. Treatment intervention with a focus on self care retraining, functional mobility, strengthening, and endurance. Pt completes supine to sit transfer independently and manages LVAD independently. Pt donns shoes with setup assist and completes stand step transfer using 4WW and Supervision. Pt able to manage door and ambulate ~10 ft before requiring seated rest break with Supervision. Pt reports min dizziness but resolves with rest. Pt ambulates towards dayroom requiring one standing rest break before making it to the adjustable table.   While seated at tabletop, .75lb wrist weights donned to B UE and flips over quirkle game pieces to colored side then sorts based on shape. Pt requires one seated rest break to complete task. Game pieces were divided in half on talbetop. Pt crosses midline and retrieves one game piece at a time to transfer to opposite side of table and place into game bag. Pt completes on the R UE then the L UE requiring multiple rest breaks to complete task. Pt requesting to check his heart rate and O2 with pulse ox reading O2 at 91% and HR 57. Therapist then begins to discuss  next task and pt reports dizziness that does not resolve and pt states "I think I'm falling out". Immediate assist from  2 other therapists provided. Pt's LE's elevated and pt was transported back to his room with total A. Pt's dizziness resolves and pt completes stand step transfer to bed with CGA. Pt completes sit to supine transfer with SBA and vitals were assessed by RN present in room. BP reading 92/59 HR via dynamap and doppler MAP at 82 or systolic 82 per RN. Pt's vitals WNL for pt per RN and is cleared to continue therapy. Pt agreeable to continue bed level but when pt begins theraband exercise, pt states "I just can't do anymore". Pt missed 10 minutes of OT treatment time. Will make up as able. Pt was left in bed at end of session with all needs met and safety measures in place.   Therapy Documentation Precautions:  Precautions Precautions: Fall, Sternal Precaution Booklet Issued: No Precaution Comments: LVAD, watch SpO2- 3L oxygen on inital eval, but weaned to 2L via [place] with SpO2 >90% Restrictions Weight Bearing Restrictions: Yes RUE Weight Bearing: Non weight bearing LUE Weight Bearing: Non weight bearing Other Position/Activity Restrictions: UE sternal precautions   Therapy/Group: Individual Therapy  Marvetta Gibbons 07/28/2022, 2:56 PM

## 2022-07-28 NOTE — Progress Notes (Signed)
LVAD Coordinator Rounding Note:   HM 3 LVAD implanted on 07/04/22 by Dr Tenny Craw under destination criteria due to uncontrolled diabetes. Left atrial appendage clipped at time of surgery.   Pt lying in bed resting this afternoon following OT session. States he is comfortable handling VAD equipment and reports no issues. Pt offered Holster Vest to see if it was more comfortable to wear his equipment. Pt educated on how to wear vest and states he would like to try it as it's more comfortable then what he is currently using. Pt states he thinks he would benefit from a concealed carry shirt he states he will look this weekend online to see what he can find.  Pt MAY NOT shower until drive line healed and provided shower bag from VAD coordinators.    VAD discharge teaching completed with pt and caregiver. See separate note for documentation. Home equipment has been delivered.   ICD therapy back on. Pt has decreased sensing now that VAD has been placed.   Plan for possible discharge early next week. Has VAD clinic f/u scheduled 08/07/22 at 10:00.    Vital signs: Temp: 98.8 HR: 76 NSR Doppler Pressure:  Auto BP: 97/68 (81) O2 Sat: 98% RA Wt: 214.3>208.8>209.2>207.2>206.6>205.3>200>197.1>195.9>198.4>197.1>189.3>190.4>190.9 >192.7>191.6>185.4>183.5>186.3 lb   LVAD interrogation reveals:  Speed: 5400 Flow: 4.8 Power: 4.0 w PI: 3.2   Alarms: none Events: none Hematocrit: 29   Fixed speed: 5400 Low speed limit: 5100     Drive Line: Existing VAD dressing CDI. Drive line anchor secured properly. Continue dressing changes to twice weekly. Next dressing change 07/31/22 to be performed by nurse champion, [place] coordinator, or trained caregiver-Jessica.  Labs:  LDH trend: 326>400>462>422>387>388>368>354>243>245>228>227>247>197>202>203>198>185   INR trend: 1.1>1.5>1.4>1.5>3.0>2.4>2.2>2.2>2.2>1.9>1.9>1.7>1.9>2.0>2.1>2.2>2.3>2.4   WBC trend:  12.6>18>20.9>17.4>11.5>11.0>15.1>16.4>13.2>11.7>9.6>10.7>9.9>9.5>8.5>7.6   Anticoagulation Plan: -INR Goal: 2.0 - 2.5 -ASA Dose: 81mg     Blood Products:  IntraOp 11/14:  - 4 FFP  - DDAVP - 617 cell saver   PostOp 11/14: - 2 FFP   07/17/22: - 1 PRBC   Device: -Medtronic -Therapies: on 07/20/22 VF > 214 BPM--ATP during charging VT > OFF   Arrythmias: AFIB RVR 11/17. Amiodarone transitioned to p.o. 07/12/22   Respiratory: Extubated 07/06/22   Renal:  -BUN:25>18>20>26>27>25>17>25>29>26>27>28>26>28>31>34   -CRT:1.28>1.23>1.44>1.46>1.47>1.27>1.1>1.34>1.28>1.11>1.4>1.18>1.35>1.37>1.51>1.46>1.41   Infection: 07/07/22>> blood cultures>> no growth 5 days; final 07/07/22>> expectorated sputum>>moderate capnocutophaga; final (On Zosyn x 1 week)     Pt Education: Completed discharge education with [name] and [name]. See separate note for documentation.  [name] checked off to perform dressing changes independently.                                           Plan/Recommendations:  1. Page VAD coordinator for equipment or drive line issues. 2. Twice weekly drive line dressing changes per VAD coordinator, nurse champion, or trained caregiver-Jessica.  Bobbye Morton RN,BSN [place] Coordinator  Office: [phone]  24/7 Pager: [phone]

## 2022-07-28 NOTE — Progress Notes (Signed)
PROGRESS NOTE   Subjective/Complaints: Equipment delivered for d/c Appreciate HF team following, SBP soft today Weight has been stable Feeling well  ROS: Patient denies fever, rash, sore throat, blurred vision, dizziness, nausea, vomiting, diarrhea, cough, shortness of breath or chest pain, joint or back/neck pain, headache, or mood change. +nosebleed   Objective:   No results found. Recent Labs    07/27/22 0440 07/28/22 0432  WBC 8.5 7.6  HGB 9.5* 9.4*  HCT 29.3* 30.5*  PLT 581* 603*   Recent Labs    07/27/22 0440 07/28/22 0432  NA 129* 130*  K 3.5 3.3*  CL 88* 89*  CO2 28 27  GLUCOSE 100* 73  BUN 31* 34*  CREATININE 1.46* 1.41*  CALCIUM 9.2 9.0    Intake/Output Summary (Last 24 hours) at 07/28/2022 1240 Last data filed at 07/28/2022 1051 Gross per 24 hour  Intake 200 ml  Output 2050 ml  Net -1850 ml        Physical Exam: Vital Signs Blood pressure 94/70, pulse 83, temperature 98.8 F (37.1 C), temperature source Oral, resp. rate 18, height 5\' 11"  (1.803 m), weight 84.5 kg, SpO2 96 %.  Constitutional: No distress . Vital signs reviewed. BMI 25.86 HEENT: NCAT, EOMI, oral membranes moist, +nosebleed Neck: supple Cardiovascular: LVAD hum, sternal incision healing  Respiratory/Chest: CTA Bilaterally without wheezes or rales. Normal effort    GI/Abdomen: BS +, non-tender, non-distended Ext: no clubbing, cyanosis, or edema Psych: pleasant and cooperative  Skin: Clean and intact without signs of breakdown. Line sites intact Neuro:  Alert and oriented x 3. Normal insight and awareness. Intact Memory. Normal language and speech. Cranial nerve exam unremarkable. Motor 4-5/5. Sensory exam normal for light touch and pain in all 4 limbs. No limb ataxia or cerebellar signs. No abnormal tone appreciated.   Musculoskeletal: some limitations d/t LVAD but normal rom for the most part.    Assessment/Plan: 1.  Functional deficits which require 3+ hours per day of interdisciplinary therapy in a comprehensive inpatient rehab setting. Physiatrist is providing close team supervision and 24 hour management of active medical problems listed below. Physiatrist and rehab team continue to assess barriers to discharge/monitor patient progress toward functional and medical goals  Care Tool:  Bathing    Body parts bathed by patient: Right arm, Left arm, Chest, Abdomen, Front perineal area, Face, Left upper leg, Buttocks, Right upper leg, Right lower leg, Left lower leg   Body parts bathed by helper: Right lower leg, Left lower leg, Buttocks     Bathing assist Assist Level: Independent with assistive device     Upper Body Dressing/Undressing Upper body dressing   What is the patient wearing?: Pull over shirt    Upper body assist Assist Level: Independent    Lower Body Dressing/Undressing Lower body dressing      What is the patient wearing?: Pants     Lower body assist Assist for lower body dressing: Independent with assitive device     Toileting Toileting    Toileting assist Assist for toileting: Independent with assistive device     Transfers Chair/bed transfer  Transfers assist     Chair/bed transfer assist level: Contact Guard/Touching assist  Locomotion Ambulation   Ambulation assist      Assist level: Contact Guard/Touching assist Assistive device: Walker-rolling Max distance: 111 ft   Walk 10 feet activity   Assist     Assist level: Contact Guard/Touching assist Assistive device: Walker-rolling   Walk 50 feet activity   Assist Walk 50 feet with 2 turns activity did not occur: Safety/medical concerns (Unable to ambulate >20' at this time secondary to impaired endurance/activity tolerance)  Assist level: Contact Guard/Touching assist Assistive device: Walker-rolling    Walk 150 feet activity   Assist Walk 150 feet activity did not occur:  Safety/medical concerns         Walk 10 feet on uneven surface  activity   Assist     Assist level: Minimal Assistance - Patient > 75% Assistive device: Hand held assist   Wheelchair     Assist Is the patient using a wheelchair?: Yes Type of Wheelchair: Manual Wheelchair activity did not occur: Safety/medical concerns (Unable to perform wheelchair mobility at this time secondary to sternal precautions)  Wheelchair assist level: Supervision/Verbal cueing Max wheelchair distance: 150    Wheelchair 50 feet with 2 turns activity    Assist    Wheelchair 50 feet with 2 turns activity did not occur: Safety/medical concerns   Assist Level: Supervision/Verbal cueing   Wheelchair 150 feet activity     Assist  Wheelchair 150 feet activity did not occur: Safety/medical concerns   Assist Level: Supervision/Verbal cueing   Blood pressure 94/70, pulse 83, temperature 98.8 F (37.1 C), temperature source Oral, resp. rate 18, height 5\' 11"  (1.803 m), weight 84.5 kg, SpO2 96 %.  Medical Problem List and Plan: 1. Functional deficits secondary to debility secondary to acute on chronic heart failure, NICM, cardiogenic shock status post LVAD.             -patient may not shower to allow for ongoing driveline incorporation             -ELOS/Goals: 5-7 days          -Continue CIR therapies including PT and OT. Interdisciplinary team conference today to discuss goals, barriers to discharge, and dc planning.   2.  Afib with RVR: continue Coumadin dosing as per pharmacy             -antiplatelet therapy: Aspirin 81 mg daily 3. Pain Management: Tylenol, decrease oxycodone to 5mg  q12H as needed, Lidoderm patch as needed 4. Depression: continue magnesium oxide.              -continue Zoloft 50 mg daily             -continue melatonin 3 mg nightly             -antipsychotic agents: n/a 5. Neuropsych/cognition: This patient is capable of making decisions on his own behalf. 6.  Skin/Wound Care: Routine skin care checks             -patient's caregiver, [name], trained on every other day driveline dressing changes per VAD coordinator   7. Fluids/Electrolytes/Nutrition: Strict I's and O's and follow-up chemistries             -2 g sodium diet/1500 mL fluid restriction             -continue iron/vitamin supplements/mag ox             -intake is solid presently   8: Acute on chronic HFrEF/nonischemic cardiomyopathy status post LVAD             -  Continue spironolactone 25 mg daily             -Continue digoxin 0.125 mg daily        -Continue torsemide 40 mg twice daily -Continue warfarin 3 mg q 1600 hrs. Continue pharmacy consult/INR -Continue sildenafil 20 mg TID -Continue Jardiance 10 mg daily -Continue magnesium oxide 400 mg twice daily -Page VAD coordinator for equipment or driveline issues Filed Weights   07/26/22 0358 07/27/22 0500 07/28/22 0348  Weight: 84.1 kg 83.2 kg 84.5 kg   -volme mgt per LVAD team   9: Paroxysmal atrial fibrillation/flutter status post ablation -continue amiodarone 200 mg twice daily -Continue warfarin 3 mg q 1600 hrs.   10: Hospital acquired pneumonia Zosyn>>completed 7 days             -wean supplemental oxygen as able             -pulmonary hygiene   11: Hyponatremia: improved; follow-up BMP             -May eventually need tolvaptan and if persists when euvolemic per cardiology   12: Diabetes mellitus, type II: CBGs before every meal and nightly  CBG (last 3)  Recent Labs    07/27/22 2058 07/28/22 0618 07/28/22 1151  GLUCAP 121* 71 152*    -12/5 improved control             -continue Jardiance 10 mg daily             -  Semglee  28 units every morning and   22 units nightly             -Decrease NovoLog to 4 units 3 times daily with meals             -Sliding scale insulin             -mag level 2.2 13: Acute kidney injury: creatinine worsening, will check with heart failure team if we can liberalize his fluid  restriction to 184mL. Decrease Toresemide to 40mg  daily.  14: History of DVT: now on warfarin 15: Hypothyroidism: Continue Synthroid 16: Anemia: Status post 1 unit PRBCs; follow-up CBC             -Left epistaxis status post cauterization, resolved  -no further re-bleeding after afrin/nasal packing  -12/5 ocean nasal spray ordered qid 17: Hyperlipidemia: Continue Lipitor 20 mg daily 18: GI prophylaxis: Continue Protonix 40 mg daily 19: Partial DNR code status: no chest compressions 20. Screening for vitamin D deficiency:   -vitamin d level in low normal range -started vitamin D supplement 22. Hypocalcemia: calcium supplement started 23. Dizziness: likely secondary to dehydration, torsemide decreased to 40mg  daily.   LOS: 7 days A FACE TO FACE EVALUATION WAS PERFORMED  Darryl Frye Darryl Frye 07/28/2022, 12:40 PM

## 2022-07-28 NOTE — Progress Notes (Signed)
Physical Therapy Session Note  Patient Details  Name: Darryl Frye MRN: [id number] Date of Birth: 03/01/1961  Today's Date: 07/28/2022 PT Individual Time: 0805-0849 PT Individual Time Calculation (min): 44 min   Short Term Goals: Week 1:  PT Short Term Goal 1 (Week 1): Patient will ambulate >50' with LRAD and CGA PT Short Term Goal 2 (Week 1): Patient will ascend/descend x4 steps with CGA PT Short Term Goal 3 (Week 1): Patient will continue to demonstrate proper understanding of HM3/Batteries   Skilled Therapeutic Interventions/Progress Updates:  Patient seated EOB on entrance to room. Just completing OT session. Patient alert and agreeable to PT session.   Patient with no pain complaint at start of session. Relates feeling the best he has felt yet, especially with his breathing. Maintained on RA throughout session with O2 nearby. Pt required warming of hands for proper reading of SpO2 with numbers throughout session from 95-100%.   Personal rollator has been delivered to room.   Pt is able to transition LVAD from wall power to battery packs with set-up assist and no cuing needed. Pt recalls need to have extra batteries, clips, and controller on him at all times. Dons harness with IND. Uses rollator bag to hold LVAD back-up bag.   Therapeutic Activity: Transfers: Pt performed sit<>stand and stand pivot transfers throughout session with supervision. No cueing provided.   Pt guided in theract for improved strengthening of strength and cardiorespiratory fitness. Pt performs sit<>stand with close body reach to basketball rim placed at forehead height. Performs 2 sets of 5 reps with good respirations, pulse and SpO2 throughout. Seated rest break between sets.   Gait Training:  Pt ambulated 147' x1 to reach day room from bedside using rollator with supervision. On return trip to room, pt ambulates 82' x1 then 27' x1 with rollator and supervision using one rest break d/t fatigue. Demonstrated  slow pace, with continuous, step through gait pattern. Provided vc/ tc for maintaining conscious awareness of fatigue level throughout.   Patient seated EOB at end of session with brakes locked, no bed alarm set as RN returning with nursing student for check and medications, and all needs within reach.   Therapy Documentation Precautions:  Precautions Precautions: Fall, Sternal Precaution Booklet Issued: No Precaution Comments: LVAD, watch SpO2- 3L oxygen on inital eval, but weaned to 2L via [place] with SpO2 >90% Restrictions Weight Bearing Restrictions: Yes RUE Weight Bearing: Non weight bearing LUE Weight Bearing: Non weight bearing Other Position/Activity Restrictions: UE sternal precautions General:   Vital Signs:   Pain:  No pain related this session. No alarms or dizziness/ lightheadedness throughout session.   Therapy/Group: Individual Therapy  Alger Simons PT, DPT, CSRS 07/28/2022, 1:14 PM

## 2022-07-28 NOTE — Progress Notes (Signed)
ANTICOAGULATION CONSULT NOTE - Follow Up Consult  Pharmacy Consult for Warfarin Indication:  LVAD and atrial fibrillation  No Known Allergies  Patient Measurements: Height: 5\' 11"  (180.3 cm) Weight: 84.5 kg (186 lb 4.6 oz) IBW/kg (Calculated) : 75.3  Vital Signs: Temp: 98.8 F (37.1 C) (12/08 0348) Temp Source: Oral (12/08 0348) BP: 94/70 (12/08 0400) Pulse Rate: 83 (12/08 0348)  Labs: Recent Labs    07/26/22 0301 07/27/22 0440 07/28/22 0432  HGB 9.0* 9.5* 9.4*  HCT 29.5* 29.3* 30.5*  PLT 602* 581* 603*  LABPROT 24.6* 25.3* 25.9*  INR 2.2* 2.3* 2.4*  CREATININE 1.51* 1.46* 1.41*     Estimated Creatinine Clearance: 60.1 mL/min (A) (by C-G formula based on SCr of 1.41 mg/dL (H)).  Assessment: 74 yom admitted with HF now s/p HM3 LVAD with LAA clip on 11/14. Was on apixaban PTA for hx Aflutter. Warfarin started on 11/17, previously managed by TCTS. Pharmacy consulted to manage warfarin dosing starting 11/30.   INR remains therapeutic at 2.3, CBC and LDH stable. Will attempt to get stable dosing regimen this week prior to discharge - continue 4mg  daily for now with daily INR checks with impending discharge next week.  Goal of Therapy:  INR 2.2-.5 Monitor platelets by anticoagulation protocol: Yes   Plan:  -Warfarin 4mg  daily -Daily INR   [name], PharmD, BCPS, [facility] Clinical Pharmacist [phone] Please check AMION for all [facility]acy numbers 07/28/2022

## 2022-07-28 NOTE — Progress Notes (Signed)
Received page from RN stating patient had near syncope during this afternoons PT session. RN stated vital signs are stable and he did not significant change in pump numbers. VAD Coordinator presented to bedside. No alarms noted. Rare PI events. PI slightly lower than this morning. Pt denies dizziness at this time and states he just feels weak following PT session.   VAD Parameters:  Flow: 4.8 Speed:5450 PI:3.2 Power:4.1w

## 2022-07-28 NOTE — Progress Notes (Signed)
Received page from RN stating patient had near syncope during this afternoons PT session. RN stated vital signs are stable and he did not significant change in pump numbers. VAD Coordinator presented to bedside. No alarms noted. Rare PI events. PI slightly lower than this morning. Denies ongoing lightheadedness, dizziness, falls, shortness of breath, and signs of bleeding. States he just feels weak after event.  Dr. Aundra Dubin made aware asked for inpatient PA to evaluate at bedside and hold dose of Jardiance and Torsemide. Ellen Henri PA made aware and presented to bedside. Encouraged patient to continue to hydrate and placed order for TED hose. Updated patient's RN Josh of changes.   VAD Parameters: Flow: 4.8 Speed:5450 PI:3.2 Power:4.1w  Alarms: none Events: rare  Ashby Dawes, BSN VAD Coordinator 24/7 Pager [phone]

## 2022-07-28 NOTE — Progress Notes (Signed)
Was CTB after pt got dizzy while working w/ PT today. Felt orthostatic w/ standing.   Pt back in bed and feels ok currently.  VAD interrogated. Parameters ok. SCr stable on AM labs, 1.4. Hg 9.4 (stable).   D/w Dr. Aundra Dubin. Will hold next dose of torsemide and Jardiance (12/9). Encouraged to drink fluids and use caution w/ positional changes. Apply TED hoses.   Lyda Jester, PA-C 07/28/2022

## 2022-07-28 NOTE — Progress Notes (Signed)
Occupational Therapy Session Note  Patient Details  Name: Darryl Frye MRN: [id number] Date of Birth: 1961-03-31  Session 1  Today's Date: 07/28/2022 OT Individual Time: 7159-5396 OT Individual Time Calculation (min): 30 min   Session 2  Today's Date: 07/28/2022 OT Individual Time: 1045-1130 OT Individual Time Calculation (min): 45 min    Short Term Goals: Week 1:  OT Short Term Goal 1 (Week 1): Pt will complete sink side bathing and dressing with CGA following sternal precautions OT Short Term Goal 2 (Week 1): Pt will transfer to toilet and shower bench with close S OT Short Term Goal 3 (Week 1): Pt will identiffy 3 energy conservation strategies for ADL's OT Short Term Goal 4 (Week 1): Pt will rate am self care routine using BORG scale <12-14 overall demo improved activity tolerance  Skilled Therapeutic Interventions/Progress Updates:    Session 1 Pt received sitting EOB with no c/o pain, agreeable to OT session, requesting to complete ADLs at the sink. He required min cueing to manage LVAD cords before standing. He stood and transferred to the w/c at the sink without an AD with (S). He completed LB sit <> stand with (S) using the sink for UE support. Assisted pt in washing hair at the sink with him leaning forward. He completed UB ADLs with (S) seated. All dressing with (S) overall. Discussed carryover to home and DME planning. He demonstrated improved activity tolerance. He transferred back to EOB and was left sitting up with all needs met.    Session 2 Pt received sitting EOB with no c/o pain, agreeable to OT session. He was able to switch LVAD to batteries with (S). He required assist to don battery holder d/t it being tangled. He stood with close (S) and used the rollator to complete 120 ft of functional mobility with close (S). Cueing required for pursed lip breathing technique during recovery. Inconsistent Spo2 reading- initially SpO2 was 87% but with more normal pleth it  increased 97%. HR stable around 87 bpm. He required an extended seated rest break. He then completed functional stepping activity without UE support to challenge dynamic standing balance and endurance- intended carryover to home threshold management and to reduce fall risk. 3x4 repetitions (forward and back step=1 rep). He had to use the bathroom halfway through sets and did so using the public bathroom in standing to void urine with close (S). He completed functional mobility back to his room with one seated rest break on the rollator, (S) overall.  He was able to transfer his LVAD over to wall power with (S). He was left EOB with all needs met.    Therapy Documentation Precautions:  Precautions Precautions: Fall, Sternal Precaution Booklet Issued: No Precaution Comments: LVAD, watch SpO2- 3L oxygen on inital eval, but weaned to 2L via [place] with SpO2 >90% Restrictions Weight Bearing Restrictions: Yes RUE Weight Bearing: Non weight bearing LUE Weight Bearing: Non weight bearing Other Position/Activity Restrictions: UE sternal precautions   Therapy/Group: Individual Therapy  Curtis Sites 07/28/2022, 6:25 AM

## 2022-07-28 NOTE — Progress Notes (Addendum)
Patient ID: Darryl Frye, male   DOB: 16-Nov-1960, 61 y.o.   MRN: [id number] Advanced Heart Failure VAD Team Note  PCP-Cardiologist: Carlyle Dolly, MD   Subjective:    Diuresed well with Torsemide BID and 2.5 metolazone, may have over diuresed with bump in Cr. Now downtrending. 1.51>1.46>1.41. Torsemide now at 40 daily. Wt stable.   MAP stable, 70s-80s  INR 2.4   Feels well. Participating in therapy session. Feels he is getting stronger. No dyspnea.   LVAD INTERROGATION:  HeartMate 3 LVAD:   Flow 5.0 Liters/min, speed 5400, power 4.1, PI 3.1   Objective:    Vital Signs:   Temp:  [98 F (36.7 C)-98.8 F (37.1 C)] 98.8 F (37.1 C) (12/08 0348) Pulse Rate:  [80-83] 83 (12/08 0348) Resp:  [16-18] 18 (12/08 0348) BP: (91-99)/(62-80) 94/70 (12/08 0400) SpO2:  [96 %-97 %] 96 % (12/08 0348) Weight:  [84.5 kg] 84.5 kg (12/08 0348) Last BM Date : 07/27/22 Mean arterial Pressure 70s-80s  Intake/Output:   Intake/Output Summary (Last 24 hours) at 07/28/2022 0801 Last data filed at 07/28/2022 0700 Gross per 24 hour  Intake 460 ml  Output 1850 ml  Net -1390 ml     Physical Exam   General:  Well appearing. No resp difficulty HEENT: Normal Neck: supple. JVD 8 cm Carotids 2+ bilat; no bruits. No lymphadenopathy or thyromegaly appreciated. Cor: Mechanical heart sounds with LVAD hum present. Sternal incision healing Lungs: CTAB. No wheezing  Abdomen: soft, nontender, nondistended. No hepatosplenomegaly. No bruits or masses. Good bowel sounds. Driveline: C/D/I; securement device intact and driveline incorporated Extremities: no cyanosis, clubbing, rash, edema Neuro: alert & orientedx3, cranial nerves grossly intact. moves all 4 extremities w/o difficulty. Affect pleasant   Labs   Basic Metabolic Panel: Recent Labs  Lab 07/22/22 0313 07/24/22 0430 07/25/22 0415 07/26/22 0301 07/27/22 0440 07/28/22 0432  NA 133* 133* 132* 132* 129* 130*  K 3.9 3.9 3.7 3.3* 3.5 3.3*  CL  90* 91* 92* 90* 88* 89*  CO2 30 29 28 28 28 27   GLUCOSE 133* 130* 89 77 100* 73  BUN 32* 28* 26* 28* 31* 34*  CREATININE 1.36* 1.35* 1.37* 1.51* 1.46* 1.41*  CALCIUM 9.4 8.8* 8.9 9.1 9.2 9.0  MG 2.2  --   --   --   --   --     Liver Function Tests: Recent Labs  Lab 07/22/22 0313  AST 30  ALT 63*  ALKPHOS 223*  BILITOT 0.4  PROT 7.3  ALBUMIN 2.6*   No results for input(s): "LIPASE", "AMYLASE" in the last 168 hours. No results for input(s): "AMMONIA" in the last 168 hours.  CBC: Recent Labs  Lab 07/22/22 0313 07/24/22 0430 07/25/22 0415 07/26/22 0301 07/27/22 0440 07/28/22 0432  WBC 8.8 9.9 9.5 8.5 8.5 7.6  NEUTROABS 6.4  --   --   --   --   --   HGB 8.4* 8.3* 8.4* 9.0* 9.5* 9.4*  HCT 26.9* 26.0* 26.5* 29.5* 29.3* 30.5*  MCV 86.2 85.5 85.2 86.3 84.4 84.3  PLT 607* 616* 609* 602* 581* 603*    INR: Recent Labs  Lab 07/24/22 0430 07/25/22 0415 07/26/22 0301 07/27/22 0440 07/28/22 0432  INR 2.0* 2.1* 2.2* 2.3* 2.4*    Other results: EKG:    Imaging   No results found.   Medications:     Scheduled Medications:  amiodarone  200 mg Oral BID   aspirin  81 mg Oral Daily   atorvastatin  20  mg Oral Daily   calcium-vitamin D  1 tablet Oral Q breakfast   Chlorhexidine Gluconate Cloth  6 each Topical Q12H   digoxin  0.125 mg Oral Daily   empagliflozin  10 mg Oral Daily   Fe Fum-Vit C-Vit B12-FA  1 capsule Oral QPC breakfast   insulin aspart  0-15 Units Subcutaneous TID WC   insulin aspart  0-5 Units Subcutaneous QHS   insulin aspart  4 Units Subcutaneous TID WC   insulin glargine-yfgn  22 Units Subcutaneous QHS   insulin glargine-yfgn  25 Units Subcutaneous Daily   levothyroxine  25 mcg Oral Q0600   magnesium oxide  400 mg Oral BID   melatonin  3 mg Oral QHS   multivitamin with minerals  1 tablet Oral Daily   oxymetazoline  1 spray Each Nare BID   pantoprazole  40 mg Oral Daily   polyethylene glycol  17 g Oral BID   senna-docusate  1 tablet Oral BID    sertraline  50 mg Oral Daily   sildenafil  20 mg Oral TID   sodium chloride  2 spray Each Nare QID   sodium chloride flush  10-40 mL Intracatheter Q12H   spironolactone  25 mg Oral Daily   torsemide  40 mg Oral Daily   warfarin  4 mg Oral q1600   Warfarin - Pharmacist Dosing Inpatient   Does not apply q1600    Infusions:  sodium chloride     sodium chloride      PRN Medications: sodium chloride, acetaminophen, albuterol, alum & mag hydroxide-simeth, diphenhydrAMINE, guaiFENesin-dextromethorphan, lidocaine, methocarbamol, mouth rinse, oxyCODONE, polyethylene glycol, prochlorperazine **OR** prochlorperazine **OR** prochlorperazine, sodium phosphate, sorbitol, traZODone    Assessment/Plan:    1. Acute on chronic systolic CHF/cardiogenic shock: Nonischemic cardiomyopathy, diagnosed 2020.  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  Cath in 12/20 with no significant coronary disease.  [employer] ICD. Most recent echo in 2/23 showed EF 20-25% with normal RV.  Recently, patient has been symptomatically worse, NYHA class IV with profound orthopnea. RHC showed markedly elevated filling pressures, primarily pulmonary venous hypertension, low cardiac output, and low PAPI.  Patient was admitted for inotrope and diuresis. Echo this admission with EF <20%, mod RV dysfunction.  He was started on milrinone with improvement in hemodynamics.  We proceeded with HM3 LVAD with LAA clip on 11/14.  He is now off milrinone and remains on sildenafil 20 tid.   - Maps stable.  - Volume stable on exam, continue 40 PO torsemide daily - Continue sildenafil 20 tid.  - Continue 25 mg spiro daily.  - Continue digoxin 0.125 daily - Continue Jardiance 10 mg daily 2. A fib RVR, post-op: H/O S/p flutter ablation.  Regular rhythm on exam. -  Continue po amiodarone 200 BID, decrease to daily at discharge.  - Continue warfarin.  3. VAD management: s/p HM-3 VAD on 11/14.  Bedside echo 11/18 LV small, VAD cannula well aligned, AoV opening every beat, septum pulling to left RV moderately HK, small to moderate anterior effusion. Ramp echo 11/22 RV mild to mod HK. Speed turned to 5400. Follow LDH daily. MAP 70s-80s generally.  - Now on warfarin with INR 2.4 - LDH stable.  - Continue ASA 81. - VAD interrogated personally. Parameters stable. 4. H/o DVT: On anticoagulation.  5. Acute hypoxemic respiratory  failure, post-op: Resolved.  On room air  Continue incentive spirometry. - Encourage mobility.  - Completed 1 week of Zosyn.  - Now off oxygen with stable sats. 6. AKI: Creatinine 1.51>1.46>1.41.  - Needs daily BMET.  7. ID: He completed initial vanc/cefepime empiric abx for ?post-op PNA. 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 CCM switched to zosyn given persistent infiltrates on imaging and he completed 1 week of Zosyn.  Now afebrile. WBC normalized  8. Hypothyroidism - On levothyroxine.  9. Hypokalemia - K 3.3. Supp w/ KCl  - Continue spironolactone.  10. Epistaxis: s/p cauterization/packing 11/22, packing now removed.  Recurrent 12/03 and left nare packed. Packing has been removed. 11. Anemia: Hgb stable at 9.4 - Continue to follow 12. Hyponatremia: Na 130, will continue to monitor 13. 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 Need to allow for ongoing driveline incorporation.   I reviewed the LVAD parameters from today, and compared the results to the patient's prior recorded data.  No programming changes were made.  The LVAD is functioning within specified parameters.  The patient performs LVAD self-test daily.  LVAD interrogation was negative for any significant power changes, alarms or PI events/speed drops.  LVAD equipment check completed and is in good working order.   Back-up equipment present.   LVAD education done on emergency procedures and precautions and reviewed exit site care.  Length of Stay: [address] [place] 07/28/2022, 8:01 AM  VAD Team --- VAD ISSUES ONLY--- Pager [phone] (7am - 7am)  Advanced Heart Failure Team  Pager [phone] (M-F; 7a - 5p)  Please contact [facility] for night-coverage after hours (5p -7a ) and weekends on [email or web address]  Patient seen with PA, agree with the above note.   He is doing well with PT, breathing is much better.   Creatinine is stable at 1.41.    General: Well appearing this am. NAD.  HEENT: Normal. Neck: Supple, JVP 7-8 cm. Carotids OK.  Cardiac:  Mechanical heart sounds with LVAD hum present.  Lungs:  CTAB, normal effort.  Abdomen:  NT, ND, no HSM. No bruits or masses. +BS  LVAD exit site: Well-healed and incorporated. Dressing dry and intact. No erythema or drainage. Stabilization device present and accurately applied. Driveline dressing changed daily per sterile technique. Extremities:  Warm and dry. No cyanosis, clubbing, rash, or edema.  Neuro:  Alert & oriented x 3. Cranial nerves grossly intact. Moves all 4 extremities w/o difficulty. Affect pleasant    Volume status looks ok on exam, can continue torsemide 40 mg daily with KCl 20 daily.   INR therapeutic.   Think we can decrease amiodarone to 200 mg once daily.   LVAD parameters stable.   Plan for home on Monday.   Loralie Champagne 07/28/2022 10:55 AM

## 2022-07-28 NOTE — Consult Note (Signed)
Neuropsychological Consultation   Patient:   Darryl Frye   DOB:   [birth year]  MR Number:  [id number]  Location:  [facility] [place] [id number] [place] [address] Dept: [place]: [phone]           Date of Service:   07/28/2022  Start Time:   9 AM End Time:   10 AM  Provider/Observer:  Ilean Skill, Psy.D.       Clinical Neuropsychologist       Billing Code/Service: [phone]  Reason for Service:    Darryl Frye is a 61 year old male referred for neuropsychological consultation due to coping and adjustment issues with significant cardiovascular disease and recent LVAD procedure.  Patient does have a past history of substance abuse including cocaine and alcohol but is in free of any substance abuse since 2020.  Patient had significant heart attack in 2020 that led to his complete cessation of substance abuse.  Patient is currently on CIR due to debility and weakness following extensive hospitalization and recent LVAD placement.  Patient is expected to be discharged in approximately 3 days.   Below is the HPI for the current admission for convenience:  HPI: Darryl Frye is a 61 year old male with a history of atrial fibrillation/flutter nonischemic cardiomyopathy who underwent right heart catheterization on 06/27/2022. He had presented with worsening symptoms, decompensated heart failure. This showed markedly elevated filling pressures, primarily pulmonary venous hypertension, low cardiac output and low PAPI. He was admitted for inotrope and diuresis. Started on milrinone and Lasix infusion. He was evaluated by Dr. Tenny Craw on 11/8 for LVAD. Eliquis was held and he was started on a heparin drip. He agreed to participate in the [facility] trial and signed the informed consent on 11/13. He underwent left ventricular assist device, TEE and left atrial appendage closure on 11/14. Admitted to ICU  and critical care medicine consulted. He was able to be extubated on 11/16. Low-grade fever and leukocytosis and placed on vancomycin and cefepime. Developed A-fib with RVR responded well to amiodarone and back in normal sinus rhythm. Warfarin and aspirin started. Acute kidney injury improving. ENT consultation by Dr. Constance Holster obtained on 11/22 due to persistent leading from left nare. Left side was cauterized and packing placed. Last chest tube discontinued on 11/22. Antibiotics changed to Zosyn given persistent infiltrates on chest x-ray and sputum culture positive for capnocytophaga. Diuresis continued. O2 requirement slowly proving. Dyspnea on exertion continued 11/24. Tolerating diet. Lasix infusion stopped 11/25. No further epistaxis and packing removed 11/27. Has PICC line right upper extremity. Foley catheter discontinued and he is voiding spontaneously. The patient completed discharge education from heart failure nursing team. The patient requires inpatient physical medicine and rehabilitation evaluations and treatment secondary to dysfunction due to chronic congestive heart failure, cardiomyopathy, cardiogenic shock.   Current Status:  Patient was awake and alert sitting up in his bed waiting for my arrival.  He was oriented with good mental status and cognition.  Patient was able to describe much of his medical history consistent with available medical records in EMR.  Patient reiterated that he has been free of any substance use for years now and does not see return to uses any potential risk for him.  Patient is motivated to do what ever is needed including taking care of and following all protocols regarding his LVAD and is being placed on the transplant list and hoping to get a heart transplant.  Patient appears fully competent and capable of maintaining procedures regarding his LVAD.  Patient reports that his ex-wife is going to help him and he will be moving in with her post discharge.  They have a  good relationship and have known each other for quite some time.  He currently lives in [place].  Behavioral Observation: Darryl Frye  presents as a 61 y.o.-year-old Right handed African American Male who appeared his stated age. his dress was Appropriate and he was Well Groomed and his manners were Appropriate to the situation.  his participation was indicative of Appropriate and Attentive behaviors.  There were physical disabilities noted.  he displayed an appropriate level of cooperation and motivation.    Interactions:    Active Appropriate and Attentive  Attention:   within normal limits and attention span and concentration were age appropriate  Memory:   within normal limits; recent and remote memory intact  Visuo-spatial:  within normal limits  Speech (Volume):  normal  Speech:   normal; normal  Thought Process:  Coherent and Relevant  Though Content:  WNL; not suicidal and not homicidal  Orientation:   person, place, time/date, and situation  Judgment:   Fair  Planning:   Fair  Affect:    Appropriate  Mood:    Euthymic  Insight:   Good  Intelligence:   normal  Substance Use:  There is a documented history of alcohol and cocaine abuse confirmed by the patient.  The patient has been completely substance free since 2020.  Medical History:   Past Medical History:  Diagnosis Date   Acute kidney injury ([place])    Acute respiratory failure (HCC)    Atrial flutter (HCC)    on Eliquis   CHF (congestive heart failure) ([place])    EF 20-25% 10/15/21, AICD in place.   Coronary artery disease    Diabetes mellitus without complication ([place])    DVT (deep venous thrombosis) ([place]) 06/2019   Heart murmur    Hypertension    Presence of permanent cardiac pacemaker    Renal disorder          Patient Active Problem List   Diagnosis Date Noted   Adjustment disorder with depressed mood 07/28/2022   Acute on chronic combined systolic and diastolic CHF  (congestive heart failure) ([place]) 07/27/2022   Prerenal azotemia 07/22/2022   Heart failure ([place]) 07/21/2022   Aspiration pneumonia of both lower lobes due to gastric secretions ([place]) 07/18/2022   Hypothyroidism 07/18/2022   Debility 07/17/2022   Acute pulmonary edema ([place]) 07/17/2022   LVAD (left ventricular assist device) present ([place]) 07/08/2022   Status post cardiac surgery 07/04/2022   CHF (congestive heart failure) ([place]) 06/27/2022   History of colonic polyps 10/24/2021   Heart failure with reduced ejection fraction ([place]) 10/24/2021   Heartburn 10/24/2021   Atrial fibrillation with RVR ([place]) 10/14/2021   Anticoagulated by anticoagulation treatment 07/30/2021   BMI 29.0-29.9,adult 07/30/2021   Depression with anxiety 07/30/2021   Dizziness 07/30/2021   S/P ICD (internal cardiac defibrillator) procedure 07/30/2021   SOB (shortness of breath) 07/30/2021   COVID-19 04/09/2021   Essential hypertension 04/09/2021   ICD (implantable cardioverter-defibrillator) discharge 04/09/2021   Atrial flutter, paroxysmal ([place]) 04/08/2021   History of crack cocaine use 05/17/2020   History of smoking 25-50 pack years 05/17/2020   Uncontrolled type 2 diabetes mellitus with hyperglycemia, without [place]-term current use of insulin ([place]) 05/17/2020   NICM (nonischemic cardiomyopathy) ([place]) 04/13/2020   Personal history of  other medical treatment 03/29/2020   DOE (dyspnea on exertion) 12/12/2019   Acute respiratory failure ([place]) 09/28/2019   DM (diabetes mellitus), type 2 ([place]) 08/24/2019   DVT, bilateral lower limbs ([place]) 08/24/2019   ESRD (end stage renal disease) ([place]) 08/24/2019   Prolonged Q-T interval on ECG 08/24/2019   Lung nodule 08/22/2019   Chronic congestive heart failure ([place]) 44/10/4740   Chronic systolic CHF (congestive heart failure) ([place]) 08/01/2019   AKI (acute kidney injury) ([place]) 07/08/2019   Atrial fibrillation/flutter 07/08/2019   Paroxysmal atrial fibrillation ([place])  07/08/2019   Cardiogenic shock ([place]) 07/08/2019   Shock liver 07/08/2019   S/P cardiac cath 08/21/2018    Psychiatric History:  Patient has had past diagnosis and issues with depression and anxiety and reports that all of his adjustment issues are related to worry and fear around his medical status and most directly his cardiovascular status.  Patient reports that he is feeling better now that he has the LVAD knows that it will be a important process to stay on top of the do exactly what his doctors have ordered.  The patient reports that he is motivated and expected to be able to do all of these procedures effectively.  Patient is motivated for heart transplant.  Family Med/Psych History:  Family History  Problem Relation Age of Onset   Hyperlipidemia Mother    Heart disease Mother        has PPM   Diabetes Mother    Hypertension Mother    Heart disease Maternal Grandmother    Prostate cancer Maternal Grandfather    Colon cancer Neg Hx    Colon polyps Neg Hx    Impression/DX:  Darryl Frye is a 62 year old male referred for neuropsychological consultation due to coping and adjustment issues with significant cardiovascular disease and recent LVAD procedure.  Patient does have a past history of substance abuse including cocaine and alcohol but is in free of any substance abuse since 2020.  Patient had significant heart attack in 2020 that led to his complete cessation of substance abuse.  Patient is currently on CIR due to debility and weakness following extensive hospitalization and recent LVAD placement.  Patient is expected to be discharged in approximately 3 days.  Patient was awake and alert sitting up in his bed waiting for my arrival.  He was oriented with good mental status and cognition.  Patient was able to describe much of his medical history consistent with available medical records in EMR.  Patient reiterated that he has been free of any substance use for years now and does not  see return to uses any potential risk for him.  Patient is motivated to do what ever is needed including taking care of and following all protocols regarding his LVAD and is being placed on the transplant list and hoping to get a heart transplant.  Patient appears fully competent and capable of maintaining procedures regarding his LVAD.  Patient reports that his ex-wife is going to help him and he will be moving in with her post discharge.  They have a good relationship and have known each other for quite some time.  He currently lives in [place].  Disposition/Plan:  Evaluated for level of depression, current cognitive status and orientation and mood state.  We did spend some time working on coping and adjustment issues.          Electronically Signed   _______________________ Ilean Skill, Psy.D. Clinical Neuropsychologist

## 2022-07-28 NOTE — Progress Notes (Signed)
Patient ID: Darryl Frye, male   DOB: [birth year], 61 y.o.   MRN: [id number]  Met with pt his equipment has been delivered to room in anticipation of discharge Monday. Messaged Heart failure MD's regarding order for cardiac rehab which therapy team recommends. He prefers [facility] since closer to his home. He still plans to go to [place] home for a week then back to his home for the transition. Pt feels ready for discharge Monday.

## 2022-07-28 NOTE — Progress Notes (Signed)
This chaplain is present with the Pt. for F/U spiritual care.   A member of the medical team is also present at the bedside. The Pt. remains optimistic about d/c and is understanding of what comes next. The Pt. continues to be grateful for Jessica's involvement. Blessings are shared with the Pt. along with signing of the Pt. heart pillow.  Chaplain Sallyanne Kuster [phone]

## 2022-07-29 DIAGNOSIS — I5023 Acute on chronic systolic (congestive) heart failure: Secondary | ICD-10-CM

## 2022-07-29 LAB — CBC
HCT: 28.2 % — ABNORMAL LOW (ref 39.0–52.0)
Hemoglobin: 9 g/dL — ABNORMAL LOW (ref 13.0–17.0)
MCH: 26.8 pg (ref 26.0–34.0)
MCHC: 31.9 g/dL (ref 30.0–36.0)
MCV: 83.9 fL (ref 80.0–100.0)
Platelets: 526 10*3/uL — ABNORMAL HIGH (ref 150–400)
RBC: 3.36 MIL/uL — ABNORMAL LOW (ref 4.22–5.81)
RDW: 17.1 % — ABNORMAL HIGH (ref 11.5–15.5)
WBC: 8.5 10*3/uL (ref 4.0–10.5)
nRBC: 0 % (ref 0.0–0.2)

## 2022-07-29 LAB — PROTIME-INR
INR: 2.7 — ABNORMAL HIGH (ref 0.8–1.2)
Prothrombin Time: 28.7 seconds — ABNORMAL HIGH (ref 11.4–15.2)

## 2022-07-29 LAB — GLUCOSE, CAPILLARY
Glucose-Capillary: 110 mg/dL — ABNORMAL HIGH (ref 70–99)
Glucose-Capillary: 127 mg/dL — ABNORMAL HIGH (ref 70–99)
Glucose-Capillary: 221 mg/dL — ABNORMAL HIGH (ref 70–99)
Glucose-Capillary: 116 mg/dL — ABNORMAL HIGH (ref 70–99)

## 2022-07-29 LAB — LACTATE DEHYDROGENASE: LDH: 196 U/L — ABNORMAL HIGH (ref 98–192)

## 2022-07-29 MED ORDER — WARFARIN SODIUM 4 MG PO TABS: 4.0000 mg | ORAL_TABLET | Freq: Every day | ORAL | Status: DC

## 2022-07-29 MED ORDER — WARFARIN SODIUM 3 MG PO TABS
3.0000 mg | ORAL_TABLET | Freq: Once | ORAL | Status: AC
  Administered 2022-07-29: 3 mg via ORAL
  Filled 2022-07-29: qty 1

## 2022-07-29 NOTE — Progress Notes (Addendum)
Patient ID: Darryl Frye, male   DOB: 1960-09-18, 61 y.o.   MRN: [id number] Advanced Heart Failure VAD Team Note  PCP-Cardiologist: Carlyle Dolly, MD   Subjective:    Was orthostatic yesterday. Torsemide and Jardiance held. TED hose placed  No BMET today.   Had recurrent epistaxis and R nare cauterized and packed by ENT this am.   Feeling better. No SOB, orthopnea or PND. Dizziness improved   LVAD INTERROGATION:  HeartMate 3 LVAD:   Flow 4.6 Liters/min, speed 5400, power 4.0, PI 3.3  Objective:    Vital Signs:   Temp:  [97.5 F (36.4 C)-98.6 F (37 C)] 97.5 F (36.4 C) (12/09 0448) Pulse Rate:  [76-80] 76 (12/09 0448) Resp:  [17-18] 18 (12/09 0448) BP: (92-102)/(72-78) 102/78 (12/09 0448) SpO2:  [95 %-97 %] 97 % (12/09 0448) Weight:  [85.1 kg] 85.1 kg (12/09 0455) Last BM Date : 07/28/22 Mean arterial Pressure 70-80s   Intake/Output:   Intake/Output Summary (Last 24 hours) at 07/29/2022 1014 Last data filed at 07/29/2022 0732 Gross per 24 hour  Intake 840 ml  Output 1875 ml  Net -1035 ml      Physical Exam   General:  NAD.  HEENT: normal  R nare packed Neck: supple. JVP not elevated.  Carotids 2+ bilat; no bruits. No lymphadenopathy or thryomegaly appreciated. Cor: LVAD hum.  Lungs: Clear. Abdomen: obese soft, nontender, non-distended. No hepatosplenomegaly. No bruits or masses. Good bowel sounds. Driveline site clean. Anchor in place.  Extremities: no cyanosis, clubbing, rash. Warm no edema  Neuro: alert & oriented x 3. No focal deficits. Moves all 4 without problem    Labs   Basic Metabolic Panel: Recent Labs  Lab 07/24/22 0430 07/25/22 0415 07/26/22 0301 07/27/22 0440 07/28/22 0432  NA 133* 132* 132* 129* 130*  K 3.9 3.7 3.3* 3.5 3.3*  CL 91* 92* 90* 88* 89*  CO2 29 28 28 28 27   GLUCOSE 130* 89 77 100* 73  BUN 28* 26* 28* 31* 34*  CREATININE 1.35* 1.37* 1.51* 1.46* 1.41*  CALCIUM 8.8* 8.9 9.1 9.2 9.0     Liver Function Tests: No  results for input(s): "AST", "ALT", "ALKPHOS", "BILITOT", "PROT", "ALBUMIN" in the last 168 hours.  No results for input(s): "LIPASE", "AMYLASE" in the last 168 hours. No results for input(s): "AMMONIA" in the last 168 hours.  CBC: Recent Labs  Lab 07/25/22 0415 07/26/22 0301 07/27/22 0440 07/28/22 0432 07/29/22 0350  WBC 9.5 8.5 8.5 7.6 8.5  HGB 8.4* 9.0* 9.5* 9.4* 9.0*  HCT 26.5* 29.5* 29.3* 30.5* 28.2*  MCV 85.2 86.3 84.4 84.3 83.9  PLT 609* 602* 581* 603* 526*     INR: Recent Labs  Lab 07/25/22 0415 07/26/22 0301 07/27/22 0440 07/28/22 0432 07/29/22 0350  INR 2.1* 2.2* 2.3* 2.4* 2.7*     Other results:   Imaging   No results found.   Medications:     Scheduled Medications:  amiodarone  200 mg Oral Daily   aspirin  81 mg Oral Daily   atorvastatin  20 mg Oral Daily   calcium-vitamin D  1 tablet Oral Q breakfast   Chlorhexidine Gluconate Cloth  6 each Topical Q12H   digoxin  0.125 mg Oral Daily   [START ON 07/30/2022] empagliflozin  10 mg Oral Daily   Fe Fum-Vit C-Vit B12-FA  1 capsule Oral QPC breakfast   insulin aspart  0-15 Units Subcutaneous TID WC   insulin aspart  0-5 Units Subcutaneous QHS   insulin  aspart  4 Units Subcutaneous TID WC   insulin glargine-yfgn  13 Units Subcutaneous Daily   insulin glargine-yfgn  22 Units Subcutaneous QHS   levothyroxine  25 mcg Oral Q0600   magnesium oxide  400 mg Oral BID   melatonin  3 mg Oral QHS   multivitamin with minerals  1 tablet Oral Daily   oxymetazoline  1 spray Each Nare BID   pantoprazole  40 mg Oral Daily   polyethylene glycol  17 g Oral BID   potassium chloride  20 mEq Oral Daily   senna-docusate  1 tablet Oral BID   sertraline  50 mg Oral Daily   sildenafil  20 mg Oral TID   sodium chloride  2 spray Each Nare QID   sodium chloride flush  10-40 mL Intracatheter Q12H   spironolactone  25 mg Oral Daily   [START ON 07/30/2022] torsemide  40 mg Oral Daily   warfarin  3 mg Oral ONCE-1600    [START ON 07/30/2022] warfarin  4 mg Oral q1600   Warfarin - Pharmacist Dosing Inpatient   Does not apply q1600    Infusions:  sodium chloride     sodium chloride      PRN Medications: sodium chloride, acetaminophen, albuterol, alum & mag hydroxide-simeth, diphenhydrAMINE, guaiFENesin-dextromethorphan, lidocaine, methocarbamol, mouth rinse, oxyCODONE, polyethylene glycol, prochlorperazine **OR** prochlorperazine **OR** prochlorperazine, sodium phosphate, sorbitol, traZODone    Assessment/Plan:    1. Acute on chronic systolic CHF/cardiogenic shock: Nonischemic cardiomyopathy, diagnosed 2020.  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  Cath in 12/20 with no significant coronary disease.  [employer] ICD. Most recent echo in 2/23 showed EF 20-25% with normal RV.  Recently, patient has been symptomatically worse, NYHA class IV with profound orthopnea. RHC showed markedly elevated filling pressures, primarily pulmonary venous hypertension, low cardiac output, and low PAPI.  Patient was admitted for inotrope and diuresis. Echo this admission with EF <20%, mod RV dysfunction.  He was started on milrinone with improvement in hemodynamics.  We proceeded with HM3 LVAD with LAA clip on 11/14.  He is now off milrinone and remains on sildenafil 20 tid.  Jardiance and torsemide held on 12/8 due to orthostasis - Maps stable.  - Volume stable on exam, continue 40 PO torsemide daily - Continue sildenafil 20 tid.  - Continue 25 mg spiro daily.  - Continue digoxin 0.125 daily - Holding Jardiance 10 mg daily due to orthostasis. Restart tomorrow 2. A fib RVR, post-op: H/O S/p flutter ablation.  Regular rhythm on exam. - Continue po amiodarone 200 BID, decrease to daily at discharge.  - Continue warfarin.  3. VAD management: s/p HM-3 VAD  on 11/14.  Bedside echo 11/18 LV small, VAD cannula well aligned, AoV opening every beat, septum pulling to left RV moderately HK, small to moderate anterior effusion. Ramp echo 11/22 RV mild to mod HK. Speed turned to 5400. Follow LDH daily. MAP 70s-80s generally. In NSR on amio 200 daily - Now on warfarin with INR 2.7 Discussed dosing with PharmD personally. - LDH 196 - Continue ASA 81. - VAD interrogated personally. Parameters stable. 4. H/o DVT: On anticoagulation.  5. Acute hypoxemic respiratory failure, post-op: Resolved.  On room air  Continue incentive spirometry. - Encourage mobility.  - Completed 1 week of Zosyn.  - Now off oxygen with stable  sats. 6. AKI: Creatinine 1.51>1.46>1.41 > not drawn today - Needs daily BMET. Recheck in am 7. ID: He completed initial vanc/cefepime empiric abx for ?post-op PNA. 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 CCM switched to zosyn given persistent infiltrates on imaging and he completed 1 week of Zosyn.  Now afebrile. WBC normalized  8. Hypothyroidism - On levothyroxine.  9. Hypokalemia - No BME today. Recheck in am  - Continue spironolactone.  10. Epistaxis: s/p cauterization/packing 11/22, packing now removed.  Recurrent 12/03 and left nare packed. Packing has been removed. R nare cauterized and packed today 12/9 by ENT. Leave packing x 5 days (remove as outpatient) 11. Anemia: Hgb stable at 9.4 -> 9.0 - Continue to follow 12. Hyponatremia: Na 130, will continue to monitor - limit FW 13. 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 Need to allow for ongoing driveline incorporation.   I reviewed the LVAD parameters from today, and compared the results to the patient's prior recorded data.  No programming changes were made.  The LVAD is functioning within specified parameters.  The patient performs LVAD self-test daily.  LVAD interrogation was negative for any significant power changes, alarms or  PI events/speed drops.  LVAD equipment check completed and is in good working order.  Back-up equipment present.   LVAD education done on emergency procedures and precautions and reviewed exit site care.  Length of Stay: 8  Glori Bickers, MD 07/29/2022, 10:14 AM  VAD Team --- VAD ISSUES ONLY--- Pager [phone] (7am - 7am)  Advanced Heart Failure Team  Pager [phone] (M-F; 7a - 5p)  Please contact [facility] for night-coverage after hours (5p -7a ) and weekends on [email or web address]

## 2022-07-29 NOTE — Consult Note (Signed)
Patient known to me from last few weeks when he had left-sided epistaxis treated.  He had done well since then and then yesterday started having bleeding from the right side.  On exam the left side looks healthy and clear.  There is clot and tiny superficial ulceration along the right anterior septal mucosal/epidermal junction.  This is the same place where he had the bleeding on the contralateral side a few weeks ago.  This area was topically anesthetized with Afrin and Xylocaine and cauterized with silver nitrate, 2 sticks.  A small Merisel was packed double pressure against the anterior septum.  This was inflated with dilute local anesthetic solution.  Will plan to keep this in for 5 days.  He is anticipating discharge home so that we can take care of this in the office on Thursday.

## 2022-07-29 NOTE — Progress Notes (Signed)
Rapid response nurse notified by Isac Sarna RN for right nostril bleeding. Rhino Rocket insert by Nurse Practitioner and RRT nurse at bedside.

## 2022-07-29 NOTE — Progress Notes (Signed)
Rhino rocket came out of patients nose. Bleeding noted coming from right nare. Patient is not showing any signs of discomfort or distress. Patient denies pain. Charge nurse made aware. On-call provider Dr. Letta Pate made aware.

## 2022-07-29 NOTE — Progress Notes (Signed)
Patient nose is bleeding from right nostril. Afrin administered, pressure held, patient sitting up right. Patient denies any pain or discomfort. Vital signs are stable.On-call MD made aware. Order received for rhino rocket if can insert, manual pressure, ice, and gauze to try to stop bleeding.

## 2022-07-29 NOTE — Progress Notes (Signed)
PROGRESS NOTE   Subjective/Complaints: Epistaxis last noc  No sig change in Hgb, chart review indicates pt had cauterization and packing per ENT Dr Constance Holster ~60moago.  Remains on warfarin for LVAD ROS: Patient denies CP, SOB, N/V/D   Objective:   No results found. Recent Labs    07/28/22 0432 07/29/22 0350  WBC 7.6 8.5  HGB 9.4* 9.0*  HCT 30.5* 28.2*  PLT 603* 526*    Recent Labs    07/27/22 0440 07/28/22 0432  NA 129* 130*  K 3.5 3.3*  CL 88* 89*  CO2 28 27  GLUCOSE 100* 73  BUN 31* 34*  CREATININE 1.46* 1.41*  CALCIUM 9.2 9.0     Intake/Output Summary (Last 24 hours) at 07/29/2022 0721 Last data filed at 07/29/2022 0457 Gross per 24 hour  Intake 720 ml  Output 1875 ml  Net -1155 ml         Physical Exam: Vital Signs Blood pressure 102/78, pulse 76, temperature (!) 97.5 F (36.4 C), temperature source Oral, resp. rate 18, height _0  (1.803 m), weight 85.1 kg, SpO2 97 %.   General: No acute distress Mood and affect are appropriate Heart: LVAD hum Lungs: Clear to auscultation, breathing unlabored, no rales or wheezes Abdomen: Positive bowel sounds, soft nontender to palpation, nondistended Extremities: No clubbing, cyanosis, or edema Skin: No evidence of breakdown, no evidence of rash   Neuro:  Alert and oriented x 3. Normal insight and awareness. Intact Memory. Normal language and speech. Cranial nerve exam unremarkable. Motor 4-5/5. Sensory exam normal for light touch and pain in all 4 limbs. No limb ataxia or cerebellar signs. No abnormal tone appreciated.   Musculoskeletal: some limitations d/t LVAD but normal rom for the most part.    Assessment/Plan: 1. Functional deficits which require 3+ hours per day of interdisciplinary therapy in a comprehensive inpatient rehab setting. Physiatrist is providing close team supervision and 24 hour management of active medical problems listed  below. Physiatrist and rehab team continue to assess barriers to discharge/monitor patient progress toward functional and medical goals  Care Tool:  Bathing    Body parts bathed by patient: Right arm, Left arm, Chest, Abdomen, Front perineal area, Face, Left upper leg, Buttocks, Right upper leg, Right lower leg, Left lower leg   Body parts bathed by helper: Right lower leg, Left lower leg, Buttocks     Bathing assist Assist Level: Independent with assistive device     Upper Body Dressing/Undressing Upper body dressing   What is the patient wearing?: Pull over shirt    Upper body assist Assist Level: Independent    Lower Body Dressing/Undressing Lower body dressing      What is the patient wearing?: Pants     Lower body assist Assist for lower body dressing: Independent with assitive device     Toileting Toileting    Toileting assist Assist for toileting: Independent with assistive device     Transfers Chair/bed transfer  Transfers assist     Chair/bed transfer assist level: Contact Guard/Touching assist     Locomotion Ambulation   Ambulation assist      Assist level: Contact Guard/Touching assist Assistive device: Walker-rolling  Max distance: 111 ft   Walk 10 feet activity   Assist     Assist level: Contact Guard/Touching assist Assistive device: Walker-rolling   Walk 50 feet activity   Assist Walk 50 feet with 2 turns activity did not occur: Safety/medical concerns (Unable to ambulate >20' at this time secondary to impaired endurance/activity tolerance)  Assist level: Contact Guard/Touching assist Assistive device: Walker-rolling    Walk 150 feet activity   Assist Walk 150 feet activity did not occur: Safety/medical concerns         Walk 10 feet on uneven surface  activity   Assist     Assist level: Minimal Assistance - Patient > 75% Assistive device: Hand held assist   Wheelchair     Assist Is the patient using a  wheelchair?: Yes Type of Wheelchair: Manual Wheelchair activity did not occur: Safety/medical concerns (Unable to perform wheelchair mobility at this time secondary to sternal precautions)  Wheelchair assist level: Supervision/Verbal cueing Max wheelchair distance: 150    Wheelchair 50 feet with 2 turns activity    Assist    Wheelchair 50 feet with 2 turns activity did not occur: Safety/medical concerns   Assist Level: Supervision/Verbal cueing   Wheelchair 150 feet activity     Assist  Wheelchair 150 feet activity did not occur: Safety/medical concerns   Assist Level: Supervision/Verbal cueing   Blood pressure 102/78, pulse 76, temperature (!) 97.5 F (36.4 C), temperature source Oral, resp. rate 18, height _0  (1.803 m), weight 85.1 kg, SpO2 97 %.  Medical Problem List and Plan: 1. Functional deficits secondary to debility secondary to acute on chronic heart failure, NICM, cardiogenic shock status post LVAD.             -patient may not shower to allow for ongoing driveline incorporation             -ELOS/Goals: 5-7 days          -Continue CIR therapies including PT and OT.  2.  Afib with RVR: continue Coumadin dosing as per pharmacy             -antiplatelet therapy: Aspirin 81 mg daily 3. Pain Management: Tylenol, decrease oxycodone to 30m q12H as needed, Lidoderm patch as needed 4. Depression: continue magnesium oxide.              -continue Zoloft 50 mg daily             -continue melatonin 3 mg nightly             -antipsychotic agents: n/a 5. Neuropsych/cognition: This patient is capable of making decisions on his own behalf. 6. Skin/Wound Care: Routine skin care checks             -patient's caregiver, JJanett Billow trained on every other day driveline dressing changes per VAD coordinator   7. Fluids/Electrolytes/Nutrition: Strict I's and O's and follow-up chemistries             -2 g sodium diet/1500 mL fluid restriction             -continue iron/vitamin  supplements/mag ox             -intake is solid presently   8: Acute on chronic HFrEF/nonischemic cardiomyopathy status post LVAD             -Continue spironolactone 25 mg daily             -Continue digoxin 0.125 mg daily        -  Continue torsemide 40 mg twice daily -Continue warfarin 3 mg q 1600 hrs. Continue pharmacy consult/INR -Continue sildenafil 20 mg TID -Continue Jardiance 10 mg daily -Continue magnesium oxide 400 mg twice daily -Page VAD coordinator for equipment or driveline issues Filed Weights   07/27/22 0500 07/28/22 0348 07/29/22 0455  Weight: 83.2 kg 84.5 kg 85.1 kg   -volme mgt per LVAD team   9: Paroxysmal atrial fibrillation/flutter status post ablation -continue amiodarone 200 mg twice daily -Continue warfarin 3 mg q 1600 hrs.   10: Hospital acquired pneumonia Zosyn>>completed 7 days             -wean supplemental oxygen as able             -pulmonary hygiene   11: Hyponatremia: improved; follow-up BMP             -May eventually need tolvaptan and if persists when euvolemic per cardiology   12: Diabetes mellitus, type II: CBGs before every meal and nightly  CBG (last 3)  Recent Labs    07/28/22 1700 07/28/22 2253 07/29/22 0637  GLUCAP 93 152* 110*     -12/5 improved control             -continue Jardiance 10 mg daily             -  Semglee  28 units every morning and   22 units nightly             -Decrease NovoLog to 4 units 3 times daily with meals             -Sliding scale insulin             -mag level 2.2 13: Acute kidney injury: creatinine worsening, will check with heart failure team if we can liberalize his fluid restriction to 1842m. Decrease Toresemide to 454mdaily.  14: History of DVT: now on warfarin 15: Hypothyroidism: Continue Synthroid 16: Anemia: Status post 1 unit PRBCs; follow-up CBC             -Left epistaxis status post cauterization, resolved  -no further re-bleeding after afrin/nasal packing  -12/5 ocean nasal spray  ordered qid 17: Hyperlipidemia: Continue Lipitor 20 mg daily 18: GI prophylaxis: Continue Protonix 40 mg daily 19: Partial DNR code status: no chest compressions 20. Screening for vitamin D deficiency:   -vitamin d level in low normal range -started vitamin D supplement 22. Hypocalcemia: calcium supplement started 23. Dizziness: likely secondary to dehydration, torsemide decreased to 4061maily.  24.  Epistaxis right nostril recurrent - need ENT re eval if spoke to Dr RosConstance Holstero did cautery lst month, he will re eval this am  LOS: 8 days A FACE TO FACE EVALUATION WAS PERFORMED  AndCharlett Blake/04/2022, 7:21 AM

## 2022-07-29 NOTE — Progress Notes (Signed)
Asked for assistance with inserting rhino rocket for epistaxis. Small rhino rocket inserted into R nostril. String taped to cheek.

## 2022-07-29 NOTE — Progress Notes (Signed)
ANTICOAGULATION CONSULT NOTE - Follow Up Consult  Pharmacy Consult for Warfarin Indication:  LVAD and atrial fibrillation  No Known Allergies  Patient Measurements: Height: 5\' 11"  (180.3 cm) Weight: 85.1 kg (187 lb 9.8 oz) IBW/kg (Calculated) : 75.3  Vital Signs: Temp: 97.5 F (36.4 C) (12/09 0448) Temp Source: Oral (12/09 0448) BP: 102/78 (12/09 0448) Pulse Rate: 76 (12/09 0448)  Labs: Recent Labs    07/27/22 0440 07/28/22 0432 07/29/22 0350  HGB 9.5* 9.4* 9.0*  HCT 29.3* 30.5* 28.2*  PLT 581* 603* 526*  LABPROT 25.3* 25.9* 28.7*  INR 2.3* 2.4* 2.7*  CREATININE 1.46* 1.41*  --      Estimated Creatinine Clearance: 60.1 mL/min (A) (by C-G formula based on SCr of 1.41 mg/dL (H)).  Assessment: 61 yom admitted with HF now s/p HM3 LVAD with LAA clip on 11/14. Was on apixaban PTA for hx Aflutter. Warfarin started on 11/17, previously managed by TCTS. Pharmacy consulted to manage warfarin dosing starting 11/30.   INR slightly above goal at 2.7, CBC and LDH stable. Will attempt to get stable dosing regimen this week prior to discharge - will plan to decrease dose today and continue 4mg  daily tomorrow with daily INR checks with impending discharge next week.  Goal of Therapy:  INR 2-2.5 Monitor platelets by anticoagulation protocol: Yes   Plan:  -Warfarin 3 mg x1  -Daily INR  Francena Hanly, PharmD Pharmacy Resident  07/29/2022 8:11 AM

## 2022-07-30 DIAGNOSIS — I5021 Acute systolic (congestive) heart failure: Secondary | ICD-10-CM

## 2022-07-30 LAB — PROTIME-INR
INR: 2.6 — ABNORMAL HIGH (ref 0.8–1.2)
Prothrombin Time: 27.5 seconds — ABNORMAL HIGH (ref 11.4–15.2)

## 2022-07-30 LAB — BASIC METABOLIC PANEL
Anion gap: 12 (ref 5–15)
BUN: 25 mg/dL — ABNORMAL HIGH (ref 6–20)
CO2: 27 mmol/L (ref 22–32)
Calcium: 9.2 mg/dL (ref 8.9–10.3)
Chloride: 94 mmol/L — ABNORMAL LOW (ref 98–111)
Creatinine, Ser: 1.21 mg/dL (ref 0.61–1.24)
GFR, Estimated: >60 mL/min (ref >60)
Glucose, Bld: 112 mg/dL — ABNORMAL HIGH (ref 70–99)
Potassium: 4.2 mmol/L (ref 3.5–5.1)
Sodium: 133 mmol/L — ABNORMAL LOW (ref 135–145)

## 2022-07-30 LAB — CBC
HCT: 27.5 % — ABNORMAL LOW (ref 39.0–52.0)
Hemoglobin: 8.6 g/dL — ABNORMAL LOW (ref 13.0–17.0)
MCH: 26.4 pg (ref 26.0–34.0)
MCHC: 31.3 g/dL (ref 30.0–36.0)
MCV: 84.4 fL (ref 80.0–100.0)
Platelets: 481 10*3/uL — ABNORMAL HIGH (ref 150–400)
RBC: 3.26 MIL/uL — ABNORMAL LOW (ref 4.22–5.81)
RDW: 16.8 % — ABNORMAL HIGH (ref 11.5–15.5)
WBC: 8.9 10*3/uL (ref 4.0–10.5)
nRBC: 0 % (ref 0.0–0.2)

## 2022-07-30 LAB — GLUCOSE, CAPILLARY
Glucose-Capillary: 111 mg/dL — ABNORMAL HIGH (ref 70–99)
Glucose-Capillary: 168 mg/dL — ABNORMAL HIGH (ref 70–99)
Glucose-Capillary: 121 mg/dL — ABNORMAL HIGH (ref 70–99)
Glucose-Capillary: 195 mg/dL — ABNORMAL HIGH (ref 70–99)

## 2022-07-30 LAB — LACTATE DEHYDROGENASE: LDH: 192 U/L (ref 98–192)

## 2022-07-30 MED ORDER — WARFARIN SODIUM 3 MG PO TABS
3.0000 mg | ORAL_TABLET | Freq: Once | ORAL | Status: AC
  Administered 2022-07-30: 3 mg via ORAL
  Filled 2022-07-30: qty 1

## 2022-07-30 MED ORDER — WARFARIN SODIUM 4 MG PO TABS: 4.0000 mg | ORAL_TABLET | Freq: Every day | ORAL | Status: DC

## 2022-07-30 NOTE — Progress Notes (Signed)
Occupational Therapy Session Note  Patient Details  Name: Darryl Frye MRN: [id number] Date of Birth: August 19, 1961  Today's Date: 07/30/2022 OT Individual Time: 8527-7824 OT Individual Time Calculation (min): 60 min    Short Term Goals: Week 1:  OT Short Term Goal 1 (Week 1): Pt will complete sink side bathing and dressing with CGA following sternal precautions OT Short Term Goal 2 (Week 1): Pt will transfer to toilet and shower bench with close S OT Short Term Goal 3 (Week 1): Pt will identiffy 3 energy conservation strategies for ADL's OT Short Term Goal 4 (Week 1): Pt will rate am self care routine using BORG scale <12-14 overall demo improved activity tolerance  Skilled Therapeutic Interventions/Progress Updates:  Pt greeted supine in bed, pt agreeable to OT intervention. Session focus on IADLS, functional mobility, dynamic standing balance, education on energy conservation and decreasing overall caregiver burden.                     Pt complete supine>sit MODI, pt switched LVAD from wall to batteries with set- up assist of batteries. Pt abel to recall needing to bring extra batteries with no cues.   Pt stood to use urinal with set- up assist, pt completed stand pivot to w/c with no AD and CGA. Total A transport to apt in w/c for energy conservation. Pt completed simple meal prep task of making peanut butter crackers with rollator MODI, pt did opt to sit to make snack d/t decreased activity tolerance. Took this time to discuss other energy conservation strategies for home with pt verbalizing understanding of education.   Pt completed ambulatory transfers to recliner and couch with rollator MODI, pt with great safety awareness when managing rollator. Education provided on sternal precautions in relation ADLs. Pt does report instance of chest pain with mobility needing to sit down. SpO2 <94% on RA HR 85 bpm.   Pt completed IADL task of collecting items around gym from knee to floor level  to simulate housekeeping tasks, pt used reacher appropriately for energy conservation and fall prevention. Pt completed task MODI with rollator but did need to sit after retrieving 5/6 items d/t decreased energy. Education provided on prioritizing household tasks to conserve energy as much as possible. Recommended that pt only complete light housekeeping tasks such as dusting, washing dishes, sweeping from sitting and laundry.  Ended session with pt supine in bed with all needs within reach and bed alarm activated. MD present at end of session assisting pt with returning power to wall.         Therapy Documentation Precautions:  Precautions Precautions: Sternal, Fall Precaution Booklet Issued: No Precaution Comments: LVAD, watch SpO2- 3L oxygen on inital eval, but weaned to 2L via [place] with SpO2 >90% Restrictions Weight Bearing Restrictions: No RUE Weight Bearing: Non weight bearing LUE Weight Bearing: Non weight bearing Other Position/Activity Restrictions: UE sternal precautions  Pain: Unrated moment of chest pain reported, seated rest break provided.       Therapy/Group: Individual Therapy  Darryl Frye 07/30/2022, 3:22 PM

## 2022-07-30 NOTE — Progress Notes (Signed)
ANTICOAGULATION CONSULT NOTE - Follow Up Consult  Pharmacy Consult for Warfarin Indication:  LVAD and atrial fibrillation  No Known Allergies  Patient Measurements: Height: 5\' 11"  (180.3 cm) Weight: 82.8 kg (182 lb 8.7 oz) IBW/kg (Calculated) : 75.3  Vital Signs: Temp: 98 F (36.7 C) (12/10 0532) BP: 101/68 (12/10 0500) Pulse Rate: 78 (12/10 0532)  Labs: Recent Labs    07/28/22 0432 07/29/22 0350 07/30/22 0413  HGB 9.4* 9.0* 8.6*  HCT 30.5* 28.2* 27.5*  PLT 603* 526* 481*  LABPROT 25.9* 28.7* 27.5*  INR 2.4* 2.7* 2.6*  CREATININE 1.41*  --  1.21     Estimated Creatinine Clearance: 70 mL/min (by C-G formula based on SCr of 1.21 mg/dL).  Assessment: 78 yom admitted with HF now s/p HM3 LVAD with LAA clip on 11/14. Was on apixaban PTA for hx Aflutter. Warfarin started on 11/17, previously managed by TCTS. Pharmacy consulted to manage warfarin dosing starting 11/30.   INR slightly above goal at 2.6, CBC and LDH stable. Will attempt to get stable dosing regimen this week prior to discharge - will plan to decrease dose again today and continue 4mg  daily tomorrow with daily INR checks with impending discharge next week.  Goal of Therapy:  INR 2-2.5 Monitor platelets by anticoagulation protocol: Yes   Plan:  -Warfarin 3 mg x1  -Daily INR  Francena Hanly, PharmD Pharmacy Resident  07/30/2022 7:36 AM

## 2022-07-30 NOTE — Progress Notes (Signed)
Physical Therapy Discharge Summary  Patient Details  Name: Darryl Frye MRN: [id number] Date of Birth: 10-Jul-1961  Date of Discharge from PT service:July 30, 2022  Today's Date: 07/30/2022   Patient has met 7 of 7 Fly term goals due to improved activity tolerance, improved balance, improved postural control, increased strength, decreased pain, and ability to compensate for deficits.  Patient to discharge at an ambulatory level Modified Independent using a Rollator.   Patient's care partner  available for set-up assist, patient mod I with rollator and will not need assist with mobility assistance at discharge.  Reasons goals not met: All PT goals met at this time.  Recommendation:  Patient will benefit from ongoing skilled PT services in outpatient setting to continue to advance safe functional mobility, address ongoing impairments in balance, activity tolerance, strength, gait and stair training, community integration, patient/caregiver education, and minimize fall risk.  Equipment: Rollater  Reasons for discharge: treatment goals met  Patient/family agrees with progress made and goals achieved: Yes  PT Discharge Precautions/Restrictions Precautions Precautions: Sternal;Fall Restrictions Weight Bearing Restrictions: No Other Position/Activity Restrictions: UE sternal precautions Pain Interference Pain Interference Pain Effect on Sleep: 0. Does not apply - I have not had any pain or hurting in the past 5 days Pain Interference with Therapy Activities: 1. Rarely or not at all Pain Interference with Day-to-Day Activities: 1. Rarely or not at all Vision/Perception  Vision - History Ability to See in Adequate Light: 1 Impaired Vision - Assessment Eye Alignment: Within Functional Limits Ocular Range of Motion: Within Functional Limits Tracking/Visual Pursuits: Able to track stimulus in all quads without difficulty Perception Perception: Within Functional  Limits Praxis Praxis: Intact  Cognition Overall Cognitive Status: Within Functional Limits for tasks assessed Arousal/Alertness: Awake/alert Orientation Level: Oriented X4 Memory: Appears intact Awareness: Appears intact Problem Solving: Appears intact Safety/Judgment: Appears intact Sensation Sensation Light Touch: Impaired Detail Peripheral sensation comments: dimished sensation B lower extremities distal to the knee (chronic ~3 yrs) Light Touch Impaired Details: Impaired RLE;Impaired LLE Hot/Cold: Appears Intact Proprioception: Appears Intact Additional Comments: Patient reports numbness/tingling in B feeet up toward his calves- States this was present prior to hospitalization Coordination Gross Motor Movements are Fluid and Coordinated: Yes Fine Motor Movements are Fluid and Coordinated: Yes Heel Shin Test: mild dysmetria on R, slow and deliberat on L Motor  Motor Motor: Other (comment);Abnormal postural alignment and control Motor - Discharge Observations: Global deconditioning, reduced postural control  Mobility Bed Mobility Rolling Right: Independent Rolling Left: Independent Supine to Sit: Independent Sit to Supine: Independent Transfers Transfers: Sit to Stand;Stand to Sit;Stand Pivot Transfers Sit to Stand: Independent with assistive device Stand to Sit: Independent with assistive device Stand Pivot Transfers: Independent with assistive device Transfer (Assistive device): 4-wheeled walker Locomotion  Gait Ambulation: Yes Gait Assistance: Independent with assistive device Gait Distance (Feet): 150 Feet Assistive device: 4-wheeled walker Gait Gait: Yes Gait Pattern: Step-through pattern;Decreased trunk rotation;Trunk flexed;Decreased hip/knee flexion - left;Decreased hip/knee flexion - right Gait velocity: Decreased Stairs / Additional Locomotion Stairs: Yes Stairs Assistance: Supervision/Verbal cueing Stair Management Technique: Two rails Number of  Stairs: 4 Height of Stairs: 6 Ramp: Supervision/Verbal cueing (using Rollator) Curb: Supervision/Verbal cueing (B rails) Wheelchair Mobility Wheelchair Mobility: Yes Wheelchair Assistance: Chartered loss adjuster: Both upper extremities Wheelchair Parts Management: Needs assistance Distance: 150 ft  Trunk/Postural Assessment  Cervical Assessment Cervical Assessment: Within Functional Limits Thoracic Assessment Thoracic Assessment: Within Functional Limits Lumbar Assessment Lumbar Assessment: Within Functional Limits Postural Control Postural Control: Deficits on evaluation  Righting Reactions: Delayed  Balance Balance Balance Assessed: Yes Static Sitting Balance Static Sitting - Balance Support: Feet supported Static Sitting - Level of Assistance: 6: Modified independent (Device/Increase time) Dynamic Sitting Balance Dynamic Sitting - Balance Support: Feet supported;During functional activity Dynamic Sitting - Level of Assistance: 6: Modified independent (Device/Increase time) Static Standing Balance Static Standing - Balance Support: During functional activity;Right upper extremity supported Static Standing - Level of Assistance: 6: Modified independent (Device/Increase time) Dynamic Standing Balance Dynamic Standing - Balance Support: Right upper extremity supported;During functional activity Dynamic Standing - Level of Assistance: 6: Modified independent (Device/Increase time) Extremity Assessment  RLE Assessment RLE Assessment: Within Functional Limits General Strength Comments: Grossly 4+ to 5/5 througout in sitting LLE Assessment LLE Assessment: Within Functional Limits General Strength Comments: Grossly 4+ to 5/5 througout in sitting   Ashlei Chinchilla L Imonie Tuch PT, DPT, NCS, CBIS  07/30/2022, 11:38 AM

## 2022-07-30 NOTE — Progress Notes (Signed)
Physical Therapy Session Note  Patient Details  Name: Darryl Frye MRN: [id number] Date of Birth: Jun 26, 1961  Today's Date: 07/30/2022 PT Individual Time: 1115-1210 PT Individual Time Calculation (min): 55 min   Short Term Goals: Week 1:  PT Short Term Goal 1 (Week 1): Patient will ambulate >50' with LRAD and CGA PT Short Term Goal 2 (Week 1): Patient will ascend/descend x4 steps with CGA PT Short Term Goal 3 (Week 1): Patient will continue to demonstrate proper understanding of HM3/Batteries  Skilled Therapeutic Interventions/Progress Updates:     Patient sitting EOB already transferred to battery packs with black bag packed with spare batteries upon PT arrival. Patient alert and agreeable to PT session. Patient denied pain during session.  Focused session on d/c assessment, see d/c note for details, and functional mobility in preparation for d/c tomorrow. Educated patient on fall risk/prevention, home modifications to prevent falls, and activation of emergency services in the event of a fall during session.   Therapeutic Activity: Bed Mobility: Patient performed rolling R/L and supine to/from sit with mod I for increased time and maintaining sternal precautions without cues on a flat bed without use of bed rails. Patient reports increased discomfort in side-lying with minimal tolerance of rolling.  Transfers: Patient performed sit to/from stand with mod I using a Rollator with appropriate use of breaks for safety without cues throughout session.  Patient performed a simulated sedan height car transfer with supervision-set-up assist using a Rollator. Provided cues x1 for safe technique.  Gait Training:  Patient ambulated 150 feet, ~100 feet, and ~20 feet using a Rollator with mod I. Ambulated with decreased gait speed, decreased step length and height, forward trunk lean, and downward head gaze. Provided verbal cues for looking up and erect posture for increased breath support for  activity tolerance. Patient self selected sitting rest breaks appropriately for energy conservation during ambulation. Patient ascended/descended 4x6" steps using B rails with supervision for safety. Performed step-to gait pattern throughout.   Patient sitting EOB, removed from battery packs to wall unit with set-up assist with 4 batteries charging, at end of session with breaks locked and all needs within reach.   Therapy Documentation Precautions:  Precautions Precautions: Sternal, Fall Precaution Booklet Issued: No Precaution Comments: LVAD, watch SpO2- 3L oxygen on inital eval, but weaned to 2L via [place] with SpO2 >90% Restrictions Weight Bearing Restrictions: No RUE Weight Bearing: Non weight bearing LUE Weight Bearing: Non weight bearing Other Position/Activity Restrictions: UE sternal precautions    Therapy/Group: Individual Therapy  Hawraa Stambaugh L Taylormarie Register PT, DPT, NCS, CBIS  07/30/2022, 12:21 PM

## 2022-07-30 NOTE — Progress Notes (Signed)
Patient ID: Darryl Frye, male   DOB: [birth year], 61 y.o.   MRN: [id number] Advanced Heart Failure VAD Team Note  PCP-Cardiologist: Carlyle Dolly, MD   Subjective:    Orthostasis resolved with holding diuretics. Working with PT today.   Transient CP but now feels fine. No SOB, orthopnea or PND.   R nare packed. No further epistaxis.   INR 2.6  LVAD INTERROGATION:  HeartMate 3 LVAD:   Flow 4.6 Liters/min, speed 5400, power 4.0, PI 3.2 No PI events today (had multiple yesterday)  Objective:    Vital Signs:   Temp:  [98 F (36.7 C)-98.8 F (37.1 C)] 98.8 F (37.1 C) (12/10 1359) Pulse Rate:  [78-87] 87 (12/10 1359) Resp:  [18] 18 (12/10 1359) BP: (94-126)/(68-92) 94/83 (12/10 1359) SpO2:  [96 %-99 %] 96 % (12/10 1359) Weight:  [82.8 kg] 82.8 kg (12/10 0532) Last BM Date : 07/30/22 Mean arterial Pressure 80s   Intake/Output:   Intake/Output Summary (Last 24 hours) at 07/30/2022 1628 Last data filed at 07/30/2022 1542 Gross per 24 hour  Intake 1504 ml  Output 1875 ml  Net -371 ml      Physical Exam   General:  NAD.  HEENT: normal  R nare packed Neck: supple. JVP 7-8 with prominent v-waves Carotids 2+ bilat; no bruits. No lymphadenopathy or thryomegaly appreciated. Cor: LVAD hum.  Lungs: Clear. Abdomen: soft, nontender, non-distended. No hepatosplenomegaly. No bruits or masses. Good bowel sounds. Driveline site clean. Anchor in place.  Extremities: no cyanosis, clubbing, rash. Warm no edema  Neuro: alert & oriented x 3. No focal deficits. Moves all 4 without problem   Labs   Basic Metabolic Panel: Recent Labs  Lab 07/25/22 0415 07/26/22 0301 07/27/22 0440 07/28/22 0432 07/30/22 0413  NA 132* 132* 129* 130* 133*  K 3.7 3.3* 3.5 3.3* 4.2  CL 92* 90* 88* 89* 94*  CO2 28 28 28 27 27   GLUCOSE 89 77 100* 73 112*  BUN 26* 28* 31* 34* 25*  CREATININE 1.37* 1.51* 1.46* 1.41* 1.21  CALCIUM 8.9 9.1 9.2 9.0 9.2     Liver Function Tests: No results for  input(s): "AST", "ALT", "ALKPHOS", "BILITOT", "PROT", "ALBUMIN" in the last 168 hours.  No results for input(s): "LIPASE", "AMYLASE" in the last 168 hours. No results for input(s): "AMMONIA" in the last 168 hours.  CBC: Recent Labs  Lab 07/26/22 0301 07/27/22 0440 07/28/22 0432 07/29/22 0350 07/30/22 0413  WBC 8.5 8.5 7.6 8.5 8.9  HGB 9.0* 9.5* 9.4* 9.0* 8.6*  HCT 29.5* 29.3* 30.5* 28.2* 27.5*  MCV 86.3 84.4 84.3 83.9 84.4  PLT 602* 581* 603* 526* 481*     INR: Recent Labs  Lab 07/26/22 0301 07/27/22 0440 07/28/22 0432 07/29/22 0350 07/30/22 0413  INR 2.2* 2.3* 2.4* 2.7* 2.6*     Other results:   Imaging   No results found.   Medications:     Scheduled Medications:  amiodarone  200 mg Oral Daily   aspirin  81 mg Oral Daily   atorvastatin  20 mg Oral Daily   calcium-vitamin D  1 tablet Oral Q breakfast   Chlorhexidine Gluconate Cloth  6 each Topical Q12H   digoxin  0.125 mg Oral Daily   empagliflozin  10 mg Oral Daily   Fe Fum-Vit C-Vit B12-FA  1 capsule Oral QPC breakfast   insulin aspart  0-15 Units Subcutaneous TID WC   insulin aspart  0-5 Units Subcutaneous QHS   insulin aspart  4 Units  Subcutaneous TID WC   insulin glargine-yfgn  13 Units Subcutaneous Daily   insulin glargine-yfgn  22 Units Subcutaneous QHS   levothyroxine  25 mcg Oral Q0600   magnesium oxide  400 mg Oral BID   melatonin  3 mg Oral QHS   multivitamin with minerals  1 tablet Oral Daily   pantoprazole  40 mg Oral Daily   polyethylene glycol  17 g Oral BID   potassium chloride  20 mEq Oral Daily   senna-docusate  1 tablet Oral BID   sertraline  50 mg Oral Daily   sildenafil  20 mg Oral TID   sodium chloride  2 spray Each Nare QID   sodium chloride flush  10-40 mL Intracatheter Q12H   spironolactone  25 mg Oral Daily   torsemide  40 mg Oral Daily   warfarin  3 mg Oral ONCE-1600   [START ON 07/31/2022] warfarin  4 mg Oral q1600   Warfarin - Pharmacist Dosing Inpatient   Does  not apply q1600    Infusions:  sodium chloride     sodium chloride      PRN Medications: sodium chloride, acetaminophen, albuterol, alum & mag hydroxide-simeth, diphenhydrAMINE, guaiFENesin-dextromethorphan, lidocaine, methocarbamol, mouth rinse, oxyCODONE, polyethylene glycol, prochlorperazine **OR** prochlorperazine **OR** prochlorperazine, sodium phosphate, sorbitol, traZODone    Assessment/Plan:    1. Acute on chronic systolic CHF/cardiogenic shock: Nonischemic cardiomyopathy, diagnosed 2020.  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  Cath in 12/20 with no significant coronary disease.  Medtronic ICD. Most recent echo in 2/23 showed EF 20-25% with normal RV.  Recently, patient has been symptomatically worse, NYHA class IV with profound orthopnea. RHC showed markedly elevated filling pressures, primarily pulmonary venous hypertension, low cardiac output, and low PAPI.  Patient was admitted for inotrope and diuresis. Echo this admission with EF <20%, mod RV dysfunction.  He was started on milrinone with improvement in hemodynamics.  We proceeded with HM3 LVAD with LAA clip on 11/14.  He is now off milrinone and remains on sildenafil 20 tid.  Jardiance and torsemide held on 12/8 due to orthostasis - Maps stable.  - Volume stable on exam. No further PI events. Would restart Jardiance in am  - Continue sildenafil 20 tid.  - Continue 25 mg spiro daily.  - Continue digoxin 0.125 daily 2. A fib RVR, post-op: H/O S/p flutter ablation.  Regular rhythm on exam. - Continue po amiodarone 200 BID, decrease to daily at discharge tomorrow  - Continue warfarin.  3. VAD management: s/p HM-3 VAD on 11/14.  Bedside echo 11/18 LV small, VAD cannula well aligned, AoV opening every beat, septum pulling to left RV moderately HK, small  to moderate anterior effusion. Ramp echo 11/22 RV mild to mod HK. Speed turned to 5400. Follow LDH daily. MAP 70s-80s generally. In NSR on amio 200 daily - Now on warfarin with INR 2.7 Discussed dosing with PharmD personally. - LDH 191 - Continue ASA 81. - VAD interrogated personally. Parameters stable. 4. H/o DVT: On anticoagulation.  5. Acute hypoxemic respiratory failure, post-op: Resolved.  On room air  Continue incentive spirometry. - Encourage mobility.  - Completed 1 week of Zosyn.  - Now off oxygen with stable sats. 6. AKI: Creatinine 1.51>1.46>1.41 > 1.21 - Restart Jardiance in am 7. ID: He completed initial vanc/cefepime empiric abx for ?  post-op PNA. 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 CCM switched to zosyn given persistent infiltrates on imaging and he completed 1 week of Zosyn.  Now afebrile. WBC normalized  8. Hypothyroidism - On levothyroxine.  9. Hypokalemia - K 4.2 - Continue spironolactone.  10. Epistaxis: s/p cauterization/packing 11/22, packing now removed.  Recurrent 12/03 and left nare packed. Packing has been removed. R nare cauterized and packed today 12/9 by ENT. Leave packing x 5 days (remove as outpatient) 11. Anemia: Hgb 9.4 -> 9.0 -> 8.6 - Continue to follow 12. Hyponatremia: Na 130-> 133, will continue to monitor - limit FW 13. 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 Need to allow for ongoing driveline incorporation.   I reviewed the LVAD parameters from today, and compared the results to the patient's prior recorded data.  No programming changes were made.  The LVAD is functioning within specified parameters.  The patient performs LVAD self-test daily.  LVAD interrogation was negative for any significant power changes, alarms or PI events/speed drops.  LVAD equipment check completed and is in good working order.  Back-up equipment present.   LVAD education done on emergency procedures and precautions  and reviewed exit site care.  Length of Stay: [place], MD 07/30/2022, 4:28 PM  VAD Team --- VAD ISSUES ONLY--- Pager [phone] (7am - 7am)  Advanced Heart Failure Team  Pager [phone] (M-F; 7a - 5p)  Please contact [facility] for night-coverage after hours (5p -7a ) and weekends on [email or web address]

## 2022-07-31 ENCOUNTER — Encounter (HOSPITAL_COMMUNITY): Payer: Self-pay

## 2022-07-31 ENCOUNTER — Other Ambulatory Visit (HOSPITAL_COMMUNITY): Payer: Self-pay

## 2022-07-31 LAB — CBC
HCT: 28 % — ABNORMAL LOW (ref 39.0–52.0)
Hemoglobin: 8.8 g/dL — ABNORMAL LOW (ref 13.0–17.0)
MCH: 26.5 pg (ref 26.0–34.0)
MCHC: 31.4 g/dL (ref 30.0–36.0)
MCV: 84.3 fL (ref 80.0–100.0)
Platelets: 556 10*3/uL — ABNORMAL HIGH (ref 150–400)
RBC: 3.32 MIL/uL — ABNORMAL LOW (ref 4.22–5.81)
RDW: 17.2 % — ABNORMAL HIGH (ref 11.5–15.5)
WBC: 9.5 10*3/uL (ref 4.0–10.5)
nRBC: 0 % (ref 0.0–0.2)

## 2022-07-31 LAB — PROTIME-INR
INR: 2.6 — ABNORMAL HIGH (ref 0.8–1.2)
Prothrombin Time: 27.8 seconds — ABNORMAL HIGH (ref 11.4–15.2)

## 2022-07-31 LAB — GLUCOSE, CAPILLARY
Glucose-Capillary: 219 mg/dL — ABNORMAL HIGH (ref 70–99)
Glucose-Capillary: 127 mg/dL — ABNORMAL HIGH (ref 70–99)
Glucose-Capillary: 104 mg/dL — ABNORMAL HIGH (ref 70–99)

## 2022-07-31 LAB — BASIC METABOLIC PANEL
Anion gap: 11 (ref 5–15)
BUN: 25 mg/dL — ABNORMAL HIGH (ref 6–20)
CO2: 26 mmol/L (ref 22–32)
Calcium: 9.1 mg/dL (ref 8.9–10.3)
Chloride: 91 mmol/L — ABNORMAL LOW (ref 98–111)
Creatinine, Ser: 1.25 mg/dL — ABNORMAL HIGH (ref 0.61–1.24)
GFR, Estimated: >60 mL/min (ref >60)
Glucose, Bld: 169 mg/dL — ABNORMAL HIGH (ref 70–99)
Potassium: 4.3 mmol/L (ref 3.5–5.1)
Sodium: 128 mmol/L — ABNORMAL LOW (ref 135–145)

## 2022-07-31 LAB — LACTATE DEHYDROGENASE: LDH: 231 U/L — ABNORMAL HIGH (ref 98–192)

## 2022-07-31 MED ORDER — TORSEMIDE 20 MG PO TABS
20.0000 mg | ORAL_TABLET | Freq: Every day | ORAL | Status: DC
  Administered 2022-07-31: 20 mg via ORAL

## 2022-07-31 MED ORDER — FE FUM-VIT C-VIT B12-FA 460-60-0.01-1 MG PO CAPS
1.0000 | ORAL_CAPSULE | Freq: Every day | ORAL | 0 refills | Status: DC
  Filled 2022-07-31: qty 30, 30d supply, fill #0

## 2022-07-31 MED ORDER — PEN NEEDLES 32G X 4 MM MISC
1.0000 | Freq: Three times a day (TID) | 0 refills | Status: DC
  Filled 2022-07-31: qty 100, 30d supply, fill #0

## 2022-07-31 MED ORDER — ACETAMINOPHEN 325 MG PO TABS: 325.0000 mg | ORAL_TABLET | ORAL | Status: AC | PRN

## 2022-07-31 MED ORDER — WARFARIN SODIUM 3 MG PO TABS: 3.0000 mg | ORAL_TABLET | Freq: Every day | ORAL | Status: DC

## 2022-07-31 MED ORDER — WARFARIN SODIUM 2 MG PO TABS
2.0000 mg | ORAL_TABLET | Freq: Once | ORAL | Status: DC
  Filled 2022-07-31: qty 1

## 2022-07-31 MED ORDER — ADULT MULTIVITAMIN W/MINERALS CH: 1.0000 | ORAL_TABLET | Freq: Every day | ORAL | Status: AC

## 2022-07-31 MED ORDER — POLYETHYLENE GLYCOL 3350 17 G PO PACK: 17.0000 g | PACK | Freq: Every day | ORAL | 0 refills | Status: DC | PRN

## 2022-07-31 MED ORDER — DIGOXIN 125 MCG PO TABS
0.1250 mg | ORAL_TABLET | Freq: Every day | ORAL | 0 refills | Status: DC
  Filled 2022-07-31: qty 30, 30d supply, fill #0

## 2022-07-31 MED ORDER — SEMGLEE 100 UNIT/ML ~~LOC~~ SOPN
22.0000 [IU] | PEN_INJECTOR | Freq: Every day | SUBCUTANEOUS | 0 refills | Status: DC
  Filled 2022-07-31: qty 6, 27d supply, fill #0

## 2022-07-31 MED ORDER — WARFARIN SODIUM 3 MG PO TABS
3.0000 mg | ORAL_TABLET | Freq: Every day | ORAL | 0 refills | Status: DC
  Filled 2022-07-31: qty 30, 30d supply, fill #0

## 2022-07-31 MED ORDER — ASPIRIN 81 MG PO CHEW
81.0000 mg | CHEWABLE_TABLET | Freq: Every day | ORAL | 0 refills | Status: AC
  Filled 2022-07-31: qty 3, 3d supply, fill #0

## 2022-07-31 MED ORDER — INSULIN GLARGINE-YFGN 100 UNIT/ML ~~LOC~~ SOLN: 13.0000 [IU] | Freq: Every day | SUBCUTANEOUS | 11 refills | Status: DC

## 2022-07-31 MED ORDER — CALCIUM CARB-CHOLECALCIFEROL 600-10 MG-MCG PO TABS
1.0000 | ORAL_TABLET | Freq: Every day | ORAL | 0 refills | Status: DC
  Filled 2022-07-31: qty 30, 30d supply, fill #0

## 2022-07-31 MED ORDER — INSULIN LISPRO (1 UNIT DIAL) 100 UNIT/ML (KWIKPEN)
PEN_INJECTOR | SUBCUTANEOUS | 0 refills | Status: DC
  Filled 2022-07-31: qty 3, 25d supply, fill #0

## 2022-07-31 MED ORDER — SALINE SPRAY 0.65 % NA SOLN: 2.0000 | Freq: Four times a day (QID) | NASAL | 0 refills | Status: DC

## 2022-07-31 MED ORDER — LEVOTHYROXINE SODIUM 25 MCG PO TABS: 25.0000 ug | ORAL_TABLET | Freq: Every day | ORAL | Status: DC

## 2022-07-31 MED ORDER — TORSEMIDE 20 MG PO TABS
20.0000 mg | ORAL_TABLET | Freq: Every day | ORAL | 0 refills | Status: DC
  Filled 2022-07-31: qty 30, 30d supply, fill #0

## 2022-07-31 MED ORDER — SILDENAFIL CITRATE 20 MG PO TABS
20.0000 mg | ORAL_TABLET | Freq: Three times a day (TID) | ORAL | 0 refills | Status: DC
  Filled 2022-07-31: qty 90, 30d supply, fill #0

## 2022-07-31 MED ORDER — POTASSIUM CHLORIDE CRYS ER 20 MEQ PO TBCR
20.0000 meq | EXTENDED_RELEASE_TABLET | Freq: Every day | ORAL | 0 refills | Status: DC
  Filled 2022-07-31: qty 30, 30d supply, fill #0

## 2022-07-31 MED ORDER — PANTOPRAZOLE SODIUM 40 MG PO TBEC
40.0000 mg | DELAYED_RELEASE_TABLET | Freq: Every day | ORAL | 0 refills | Status: DC
  Filled 2022-07-31: qty 30, 30d supply, fill #0

## 2022-07-31 MED ORDER — AMIODARONE HCL 200 MG PO TABS
200.0000 mg | ORAL_TABLET | Freq: Every day | ORAL | 0 refills | Status: DC
  Filled 2022-07-31: qty 30, 30d supply, fill #0

## 2022-07-31 MED ORDER — MAGNESIUM OXIDE -MG SUPPLEMENT 400 (240 MG) MG PO TABS: 400.0000 mg | ORAL_TABLET | Freq: Two times a day (BID) | ORAL | 0 refills | Status: DC

## 2022-07-31 MED ORDER — EMPAGLIFLOZIN 10 MG PO TABS
10.0000 mg | ORAL_TABLET | Freq: Every day | ORAL | 0 refills | Status: DC
  Filled 2022-07-31: qty 30, 30d supply, fill #0

## 2022-07-31 MED ORDER — MELATONIN 3 MG PO TABS: 3.0000 mg | ORAL_TABLET | Freq: Every day | ORAL | 0 refills | Status: DC

## 2022-07-31 NOTE — Progress Notes (Signed)
Inpatient Rehabilitation Care Coordinator Discharge Note   Patient Details  Name: Darryl Frye MRN: [id number] Date of Birth: 12-18-1960   Discharge location: GOING TO EX-WIFE'S[facility] HOME FOR ONE WEEK THEN TRANSITION BACK TO HIS HOME  Length of Stay: 10 DAYS  Discharge activity level: MOD/I-SUPERVISION LEVEL  Home/community participation: ACTIVE  Patient response WT:[id number] Literacy - How often do you need to have someone help you when you read instructions, pamphlets, or other written material from your doctor or pharmacy?: Never  Patient response KL:[id number] Isolation - How often do you feel lonely or isolated from those around you?: Never  Services provided included: MD, RD, PT, OT, RN, CM, TR, Pharmacy, Neuropsych, SW  Financial Services:  Financial Services Utilized: Medicaid    Choices offered to/list presented to:    Follow-up services arranged:  Outpatient, DME, Patient/Family has no preference for HH/DME agencies    Outpatient Servicies: [facility] MD TO PLACE ORDER ONCE FOLLOWS UP WITH OFFICE DME : [facility] [place] [facility]    Patient response to transportation need: Is the patient able to respond to transportation needs?: Yes In the past 12 months, has lack of transportation kept you from medical appointments or from getting medications?: No In the past 12 months, has lack of transportation kept you from meetings, work, or from getting things needed for daily living?: No    Comments (or additional information): PT DID WELL AND REACHED HIS GOALS AND IS READY TO [place]  Patient/Family verbalized understanding of follow-up arrangements:  Yes  Individual responsible for coordination of the follow-up plan: JESSICA-EX-WIFE [phone]  Confirmed correct DME delivered: Darryl Frye 07/31/2022    Darryl Frye

## 2022-07-31 NOTE — Progress Notes (Signed)
Inpatient Rehabilitation Discharge Medication Review by a Pharmacist  A complete drug regimen review was completed for this patient to identify any potential clinically significant medication issues.  High Risk Drug Classes Is patient taking? Indication by Medication  Antipsychotic No   Anticoagulant Yes Warfarin - LVAD, Afib  Antibiotic No   Opioid No   Antiplatelet No   Hypoglycemics/insulin Yes Lantus, Jardiance - DM  Vasoactive Medication Yes Sildenafil, spironolactone, digoxin, Jardiance, torsemide - CHF Amiodarone - AFib  Chemotherapy No   Other Yes Albuterol nebs prn SOB Levothyroxine - low thyroid Atorvastatin - cholesterol Pantoprazole - Reflux Sertraline - mood     Type of Medication Issue Identified Description of Issue Recommendation(s)  Drug Interaction(s) (clinically significant)     Duplicate Therapy     Allergy     No Medication Administration End Date     Incorrect Dose     Additional Drug Therapy Needed     Significant med changes from prior encounter (inform family/care partners about these prior to discharge).    Other       Clinically significant medication issues were identified that warrant physician communication and completion of prescribed/recommended actions by midnight of the next day:  No  Pharmacist comments: NOne  Time spent performing this drug regimen review (minutes):  30 minutes  Thank you Anette Guarneri, PharmD

## 2022-07-31 NOTE — Progress Notes (Addendum)
Patient ID: Darryl Frye, male   DOB: 11-Feb-1961, 61 y.o.   MRN: [id number] Advanced Heart Failure VAD Team Note  PCP-Cardiologist: Carlyle Dolly, MD   Subjective:    No complaints this am. Denies recurrent dizziness.  Ready to go home.  R nare packed.   INR 2.6.  No BMET or CBC this am, ordered  LVAD INTERROGATION:  HeartMate 3 LVAD:   Flow 4.6 Liters/min, speed 5400, power 3.9, PI 3.2. No PI events.  Objective:    Vital Signs:   Temp:  [98.1 F (36.7 C)-98.8 F (37.1 C)] 98.1 F (36.7 C) (12/11 0600) Pulse Rate:  [62-87] 62 (12/11 0600) Resp:  [18] 18 (12/11 0600) BP: (91-107)/(62-83) 91/62 (12/11 0600) SpO2:  [96 %-100 %] 97 % (12/11 0600) Last BM Date : 07/30/22 Mean arterial Pressure 70s-80s  Intake/Output:   Intake/Output Summary (Last 24 hours) at 07/31/2022 0805 Last data filed at 07/31/2022 0200 Gross per 24 hour  Intake 957 ml  Output 2575 ml  Net -1618 ml     Physical Exam   Physical Exam: GENERAL: Well appearing, sitting in wheelchair HEENT: R nare packed NECK: Supple, JVP not elevated.  2+ bilaterally, no bruits.   CARDIAC:  Mechanical heart sounds with LVAD hum present. Sternum and CT sites healing well. LUNGS:  Clear to auscultation bilaterally.  ABDOMEN:  Soft, round, nontender, positive bowel sounds x4.     LVAD exit site: Dressing dry and intact.  No erythema or drainage.  Stabilization device present and accurately applied.   EXTREMITIES:  Warm and dry, no cyanosis, clubbing, rash or edema  NEUROLOGIC:  Alert and oriented x 4.  Gait steady.  No aphasia.  No dysarthria.  Affect pleasant.       Labs   Basic Metabolic Panel: Recent Labs  Lab 07/25/22 0415 07/26/22 0301 07/27/22 0440 07/28/22 0432 07/30/22 0413  NA 132* 132* 129* 130* 133*  K 3.7 3.3* 3.5 3.3* 4.2  CL 92* 90* 88* 89* 94*  CO2 28 28 28 27 27   GLUCOSE 89 77 100* 73 112*  BUN 26* 28* 31* 34* 25*  CREATININE 1.37* 1.51* 1.46* 1.41* 1.21  CALCIUM 8.9 9.1 9.2 9.0  9.2    Liver Function Tests: No results for input(s): "AST", "ALT", "ALKPHOS", "BILITOT", "PROT", "ALBUMIN" in the last 168 hours.  No results for input(s): "LIPASE", "AMYLASE" in the last 168 hours. No results for input(s): "AMMONIA" in the last 168 hours.  CBC: Recent Labs  Lab 07/26/22 0301 07/27/22 0440 07/28/22 0432 07/29/22 0350 07/30/22 0413  WBC 8.5 8.5 7.6 8.5 8.9  HGB 9.0* 9.5* 9.4* 9.0* 8.6*  HCT 29.5* 29.3* 30.5* 28.2* 27.5*  MCV 86.3 84.4 84.3 83.9 84.4  PLT 602* 581* 603* 526* 481*    INR: Recent Labs  Lab 07/27/22 0440 07/28/22 0432 07/29/22 0350 07/30/22 0413 07/31/22 0438  INR 2.3* 2.4* 2.7* 2.6* 2.6*    Other results:   Imaging   No results found.   Medications:     Scheduled Medications:  amiodarone  200 mg Oral Daily   aspirin  81 mg Oral Daily   atorvastatin  20 mg Oral Daily   calcium-vitamin D  1 tablet Oral Q breakfast   Chlorhexidine Gluconate Cloth  6 each Topical Q12H   digoxin  0.125 mg Oral Daily   empagliflozin  10 mg Oral Daily   Fe Fum-Vit C-Vit B12-FA  1 capsule Oral QPC breakfast   insulin aspart  0-15 Units Subcutaneous TID WC  insulin aspart  0-5 Units Subcutaneous QHS   insulin aspart  4 Units Subcutaneous TID WC   insulin glargine-yfgn  13 Units Subcutaneous Daily   insulin glargine-yfgn  22 Units Subcutaneous QHS   levothyroxine  25 mcg Oral Q0600   magnesium oxide  400 mg Oral BID   melatonin  3 mg Oral QHS   multivitamin with minerals  1 tablet Oral Daily   pantoprazole  40 mg Oral Daily   polyethylene glycol  17 g Oral BID   potassium chloride  20 mEq Oral Daily   senna-docusate  1 tablet Oral BID   sertraline  50 mg Oral Daily   sildenafil  20 mg Oral TID   sodium chloride  2 spray Each Nare QID   sodium chloride flush  10-40 mL Intracatheter Q12H   spironolactone  25 mg Oral Daily   torsemide  40 mg Oral Daily   warfarin  4 mg Oral q1600   Warfarin - Pharmacist Dosing Inpatient   Does not apply  q1600    Infusions:  sodium chloride     sodium chloride      PRN Medications: sodium chloride, acetaminophen, albuterol, alum & mag hydroxide-simeth, diphenhydrAMINE, guaiFENesin-dextromethorphan, lidocaine, methocarbamol, mouth rinse, oxyCODONE, polyethylene glycol, prochlorperazine **OR** prochlorperazine **OR** prochlorperazine, sodium phosphate, sorbitol, traZODone    Assessment/Plan:    1. Acute on chronic systolic CHF/cardiogenic shock: Nonischemic cardiomyopathy, diagnosed 2020.  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  Cath in 12/20 with no significant coronary disease.  Medtronic ICD. Most recent echo in 2/23 showed EF 20-25% with normal RV.  Recently, patient has been symptomatically worse, NYHA class IV with profound orthopnea. RHC showed markedly elevated filling pressures, primarily pulmonary venous hypertension, low cardiac output, and low PAPI.  Patient was admitted for inotrope and diuresis. Echo this admission with EF <20%, mod RV dysfunction.  He was started on milrinone with improvement in hemodynamics.  We proceeded with HM3 LVAD with LAA clip on 11/14.  He is now off milrinone and remains on sildenafil 20 tid.  Jardiance and torsemide held on 12/8 due to orthostasis - Maps stable.  - Volume stable on exam. No additional PI events. He will go home on lower dose of torsemide, 20 mg daily.  - Restart Jardiance 10 mg daily.  - Reassess volume status at f/u next week.  - Continue sildenafil 20 tid.  - Continue 25 mg spiro daily.  - Continue digoxin 0.125 daily 2. A fib RVR, post-op: H/O S/p flutter ablation.  Regular rhythm on exam. - Continue po amiodarone, decrease to 200 mg daily - Continue warfarin.  3. VAD management: s/p HM-3 VAD on 11/14.  Bedside echo 11/18 LV small, VAD cannula well  aligned, AoV opening every beat, septum pulling to left RV moderately HK, small to moderate anterior effusion. Ramp echo 11/22 RV mild to mod HK. Speed turned to 5400. Follow LDH daily. MAP 70s-80s generally. In NSR on po amiodarone - Now on warfarin with INR 2.6. - LDH 231 - Continue ASA 81. - VAD interrogated personally. Parameters stable. 4. H/o DVT: On anticoagulation.  5. Acute hypoxemic respiratory failure, post-op: Resolved.  On room air  Continue incentive spirometry. - Encourage mobility.  - Completed 1 week of Zosyn.  - Now off oxygen with stable sats. 6. AKI: Creatinine 1.51>1.46>1.41 >  1.21 - Restart Jardiance  - BMET this am 7. ID: He completed initial vanc/cefepime empiric abx for ?post-op PNA. 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 CCM switched to zosyn given persistent infiltrates on imaging and he completed 1 week of Zosyn.  Now afebrile. WBC normalized  8. Hypothyroidism - On levothyroxine.  9. Hypokalemia - K 4.2 yesterday. Labs today as above - Continue spironolactone + K supplement 10. Epistaxis: s/p cauterization/packing 11/22, packing now removed.  Recurrent 12/03 and left nare packed. Packing has been removed. R nare cauterized and packed 12/9 by ENT. Leave packing x 5 days (remove as outpatient) 11. Anemia: Hgb 9.4 -> 9.0 -> 8.6 - Continue to follow - CBC today 12. Hyponatremia: Na 130-> 133, will continue to monitor - limit FW 13. 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 Need to allow for ongoing driveline incorporation.   Ordered AM CBC, BMET. Will need to remove PICC prior to discharge (once labs obtained).  Planning to discharge home late this am. Has f/u in VAD clinic.  HF meds at discharge: Amiodarone 200 mg daily Aspirin 81 mg daily (last dose 12/14) Atorvastatin 20 mg daily Digoxin 0.125 mg daily Jardiance 10 mg daily Potassium chloride 20 mEq daily  Torsemide 20 mg daily Sildenafil 20 mg  TID Spiro 25 mg daily Warfarin  I reviewed the LVAD parameters from today, and compared the results to the patient's prior recorded data.  No programming changes were made.  The LVAD is functioning within specified parameters.  The patient performs LVAD self-test daily.  LVAD interrogation was negative for any significant power changes, alarms or PI events/speed drops.  LVAD equipment check completed and is in good working order.  Back-up equipment present.   LVAD education done on emergency procedures and precautions and reviewed exit site care.  Length of Stay: [address], PA-C 07/31/2022, 8:05 AM  VAD Team --- VAD ISSUES ONLY--- Pager [phone] (7am - 7am)  Advanced Heart Failure Team  Pager [phone] (M-F; 7a - 5p)  Please contact [facility] for night-coverage after hours (5p -7a ) and weekends on [email or web address]  Patient seen with PA, agree with the above note.   Still gets some dyspnea with longer walks.  Otherwise, no complaints.   General: Well appearing this am. NAD.  HEENT: Normal. Neck: Supple, JVP 8 cm. Carotids OK.  Cardiac:  Mechanical heart sounds with LVAD hum present.  Lungs:  CTAB, normal effort.  Abdomen:  NT, ND, no HSM. No bruits or masses. +BS  LVAD exit site: Well-healed and incorporated. Dressing dry and intact. No erythema or drainage. Stabilization device present and accurately applied. Driveline dressing changed daily per sterile technique. Extremities:  Warm and dry. No cyanosis, clubbing, rash, or edema.  Neuro:  Alert & oriented x 3. Cranial nerves grossly intact. Moves all 4 extremities w/o difficulty. Affect pleasant    Restart Jardiance 10 mg daily today and decrease torsemide to 20 mg daily.   LVAD parameters stable.   He is ready to go home today.  Meds as above.  Followup VAD clinic.  Also needs to see PCP for diabetes management.   Loralie Champagne 07/31/2022 8:44 AM

## 2022-07-31 NOTE — Progress Notes (Signed)
PICC line was removed per order with no complications.  Pt supine and suspended inspiration during removal with instructions to remain supine for 30 minutes after removal.  Pressure held to achieve hemostasis.  Vaseline/gauze/tegaderm applied.  Patient education provided regarding lifting restrictions, site care, and signs of infection.  Pt verbalized understanding and had no questions.

## 2022-07-31 NOTE — Progress Notes (Addendum)
LVAD Coordinator Rounding Note:   HM 3 LVAD implanted on 07/04/22 by Dr Tenny Craw under destination criteria due to uncontrolled diabetes. Left atrial appendage clipped at time of surgery.   Pt sitting in wheelchair upon my arrival awaiting discharge. Home equipment brought to bedside and reviewed with pt and caregiver [name]. Discharge instructions reviewed with pt and [name] as mentioned below. Verified pt had [facility] and pager phone numbers programmed in phone.  Pt scheduled to have R nare packing removed Thursday with Dr. Constance Holster.  Pt reminded he MAY NOT shower until drive line healed and provided shower bag from VAD coordinators and MAY NOT drive until cleared by MD.  The home inspection checklist has been reviewed and no unsafe conditions have been identified. Family reports that there are at least two dedicated grounded, 3-prong outlets with clearly labeled circuit breaker has been established in the bedroom for power module and Charity fundraiser.   Discharge equipment includes:  1. Two system controllers. 2. Mobile Power Unit (MPU) with 20' patient cable 3. One universal Charity fundraiser (UBC) 4. Eight fully charged batteries  5. Four battery clips 6. One travel case 7. One holster vest 8. 1 Consolidated bag  8. Wearable accessory package 9. Daily dressing kits, anchors, Aquacel (silver dressing)  VAD Education:    1. Reviewed importance of having a 24 hour caregiver 2. Reviewed dressing change frequency 3. Reviewed when to call the VAD pager and made sure they have phone number in their phone.  4. Reviewed importance of changing one power source at a time 5. Reviewed importance of carrying black emergency bag containing backup controller, 2 batteries, and 2 battery clips, everywhere 6. Reviewed importance of placing mobile power unit (MPU) and batteries on bedside table with a flashlight. Talked about what to do in case of power failure. Reminded to make sure the outlets that  equipment is plugged into are not controlled by a light switch.  7. Reviewed importance of using anchors to hold drive line in place, to prevent accidental pulling, or dislodgement of drive line.  8. Patient and family agreed to pick up prescriptions. Stressed importance of taking Warfarin daily in the evening. Stressed importance of taking all prescribed medications as written 9. First clinic visit bring all medications and VAD log.     Vital signs: Temp: 98.1 HR: 76 NSR Doppler Pressure: 88 Auto BP: 91/62 (73) O2 Sat: 98% RA Wt: 214.3>208.8>209.2>207.2>206.6>205.3>200>197.1>195.9>198.4>197.1>189.3>190.4>190.9 >192.7>191.6>185.4>183.5>186.3>187.6>182.5 lb   LVAD interrogation reveals:  Speed: 5400 Flow: 4.3 Power: 4.1 w PI: 4.1   Alarms: none Events: none Hematocrit: 29   Fixed speed: 5400 Low speed limit: 5100     Drive Line: Dressing change performed by caregiver [name]. Existing VAD dressing removed and site care performed using sterile technique. Drive line exit site cleaned with Chlora prep applicators x 2, allowed to dry, and gauze dressing with silver strip applied. Exit site partially incorporated, the velour is fully implanted at exit site. 1 black suture in place. Slight bruising at exit site. Scant amount of dried serous drainage on previous dressing/silver strip. No redness, tenderness, or foul odor. Drive line anchor re-applied. Will advance dressing changes to twice weekly. Next dressing change 08/03/22 to be performed by nurse champion, [place] coordinator, or trained caregiver-Jessica   Labs:  LDH trend: 326>400>462>422>387>388>368>354>243>245>228>227>247>197>202>203>198>185>196>192>231   INR trend: 1.1>1.5>1.4>1.5>3.0>2.4>2.2>2.2>2.2>1.9>1.9>1.7>1.9>2.0>2.1>2.2>2.3>2.4>2.7>2.6>2.6   WBC trend: 12.6>18>20.9>17.4>11.5>11.0>15.1>16.4>13.2>11.7>9.6>10.7>9.9>9.5>8.5>7.6>8.5>8.9>9.5   Anticoagulation Plan: -INR Goal: 2.0 - 2.5 -ASA Dose: 81mg     Blood Products:   IntraOp 11/14:  - 4 FFP  -  DDAVP - 617 cell saver   PostOp 11/14: - 2 FFP   07/17/22: - 1 PRBC   Device: -Medtronic -Therapies: on 07/20/22 VF > 214 BPM--ATP during charging VT > OFF   Arrythmias: AFIB RVR 11/17. Amiodarone transitioned to p.o. 07/12/22   Respiratory: Extubated 07/06/22   Renal:  -BUN:25>18>20>26>27>25>17>25>29>26>27>28>26>28>31>34>25>25   -CRT:1.28>1.23>1.44>1.46>1.47>1.27>1.1>1.34>1.28>1.11>1.4>1.18>1.35>1.37>1.51>1.46>1.41>1.21>1.25   Infection: 07/07/22>> blood cultures>> no growth 5 days; final 07/07/22>> expectorated sputum>>moderate capnocutophaga; final (On Zosyn x 1 week)     Pt Education: Completed discharge education with [name] and [name]. See separate note for documentation.  [name] checked off to perform dressing changes independently.                                           Plan/Recommendations:  Plan for discharge today with follow up in [facility] 08/07/22 at 1000 Pt encourage to call [facility] or VAD Pager with any VAD questions or equipment issues  Bobbye Morton RN,BSN [place] Coordinator  Office: [phone]  24/7 Pager: [phone]

## 2022-07-31 NOTE — Progress Notes (Addendum)
ANTICOAGULATION CONSULT NOTE - Follow Up Consult  Pharmacy Consult for Warfarin Indication:  LVAD and atrial fibrillation  No Known Allergies  Patient Measurements: Height: 5\' 11"  (180.3 cm) Weight: 82.8 kg (182 lb 8.7 oz) IBW/kg (Calculated) : 75.3  Vital Signs: Temp: 98.1 F (36.7 C) (12/11 0600) Temp Source: Oral (12/11 0600) BP: 91/62 (12/11 0600) Pulse Rate: 62 (12/11 0600)  Labs: Recent Labs    07/29/22 0350 07/30/22 0413 07/31/22 0438  HGB 9.0* 8.6*  --   HCT 28.2* 27.5*  --   PLT 526* 481*  --   LABPROT 28.7* 27.5* 27.8*  INR 2.7* 2.6* 2.6*  CREATININE  --  1.21  --      Estimated Creatinine Clearance: 70 mL/min (by C-G formula based on SCr of 1.21 mg/dL).  Assessment: 60 yom admitted with HF now s/p HM3 LVAD with LAA clip on 11/14. Was on apixaban PTA for hx Aflutter. Warfarin started on 11/17, previously managed by [facility]. Pharmacy consulted to manage warfarin dosing starting 11/30.   INR slightly above goal at 2.6, LDH trended up slightly to 231. Will give 2mg  today prior to discharge and given recent trend will continue with prior to admission dose of 3mg  daily at discharge.   Goal of Therapy:  INR 2-2.5 Monitor platelets by anticoagulation protocol: Yes   Plan:  -Warfarin 2 mg x1 today then 3mg  daily at discharge -Daily INR while admitted  Erin Hearing PharmD., BCPS Clinical Pharmacist 07/31/2022 9:51 AM

## 2022-07-31 NOTE — Progress Notes (Signed)
PROGRESS NOTE   Subjective/Complaints: Epistaxis this morning. Ex-wife asks if this will keep happening at home. Discussed that coumadin can increase risk of bleeding Placed order to d/c PICC ROS: Patient denies CP, SOB, N/V/D, +epistaxis   Objective:   No results found. Recent Labs    07/30/22 0413 07/31/22 0855  WBC 8.9 9.5  HGB 8.6* 8.8*  HCT 27.5* 28.0*  PLT 481* 556*   Recent Labs    07/30/22 0413 07/31/22 0855  NA 133* 128*  K 4.2 4.3  CL 94* 91*  CO2 27 26  GLUCOSE 112* 169*  BUN 25* 25*  CREATININE 1.21 1.25*  CALCIUM 9.2 9.1    Intake/Output Summary (Last 24 hours) at 07/31/2022 1010 Last data filed at 07/31/2022 0806 Gross per 24 hour  Intake 956 ml  Output 2575 ml  Net -1619 ml        Physical Exam: Vital Signs Blood pressure 91/62, pulse 62, temperature 98.1 F (36.7 C), temperature source Oral, resp. rate 18, height _0  (1.803 m), weight 82.8 kg, SpO2 97 %.  Gen: no distress, normal appearing HEENT: oral mucosa pink and moist, NCAT Cardio: Reg rate Chest: normal effort, normal rate of breathing Abd: soft, non-distended Ext: no edema Psych: pleasant, normal affect Skin: intact   Neuro:  Alert and oriented x 3. Normal insight and awareness. Intact Memory. Normal language and speech. Cranial nerve exam unremarkable. Motor 4-5/5. Sensory exam normal for light touch and pain in all 4 limbs. No limb ataxia or cerebellar signs. No abnormal tone appreciated.   Musculoskeletal: some limitations d/t LVAD but normal rom for the most part.    Assessment/Plan: 1. Functional deficits which require 3+ hours per day of interdisciplinary therapy in a comprehensive inpatient rehab setting. Physiatrist is providing close team supervision and 24 hour management of active medical problems listed below. Physiatrist and rehab team continue to assess barriers to discharge/monitor patient progress  toward functional and medical goals  Care Tool:  Bathing    Body parts bathed by patient: Right arm, Left arm, Chest, Abdomen, Front perineal area, Face, Left upper leg, Buttocks, Right upper leg, Right lower leg, Left lower leg   Body parts bathed by helper: Right lower leg, Left lower leg, Buttocks     Bathing assist Assist Level: Independent with assistive device     Upper Body Dressing/Undressing Upper body dressing   What is the patient wearing?: Pull over shirt    Upper body assist Assist Level: Independent    Lower Body Dressing/Undressing Lower body dressing      What is the patient wearing?: Pants     Lower body assist Assist for lower body dressing: Independent with assitive device     Toileting Toileting    Toileting assist Assist for toileting: Independent with assistive device     Transfers Chair/bed transfer  Transfers assist     Chair/bed transfer assist level: Independent with assistive device Chair/bed transfer assistive device: Other (rollator)   Locomotion Ambulation   Ambulation assist      Assist level: Independent with assistive device Assistive device: Rollator Max distance: 150 ft   Walk 10 feet activity   Assist  Assist level: Independent with assistive device Assistive device: Rollator   Walk 50 feet activity   Assist Walk 50 feet with 2 turns activity did not occur: Safety/medical concerns (Unable to ambulate >20' at this time secondary to impaired endurance/activity tolerance)  Assist level: Independent with assistive device Assistive device: Rollator    Walk 150 feet activity   Assist Walk 150 feet activity did not occur: Safety/medical concerns  Assist level: Independent with assistive device Assistive device: Rollator    Walk 10 feet on uneven surface  activity   Assist     Assist level: Supervision/Verbal cueing Assistive device: Rollator   Wheelchair     Assist Is the patient using a  wheelchair?: Yes Type of Wheelchair: Manual Wheelchair activity did not occur: Safety/medical concerns (Unable to perform wheelchair mobility at this time secondary to sternal precautions)  Wheelchair assist level: Supervision/Verbal cueing Max wheelchair distance: 150    Wheelchair 50 feet with 2 turns activity    Assist    Wheelchair 50 feet with 2 turns activity did not occur: Safety/medical concerns   Assist Level: Supervision/Verbal cueing   Wheelchair 150 feet activity     Assist  Wheelchair 150 feet activity did not occur: Safety/medical concerns   Assist Level: Supervision/Verbal cueing   Blood pressure 91/62, pulse 62, temperature 98.1 F (36.7 C), temperature source Oral, resp. rate 18, height _0  (1.803 m), weight 82.8 kg, SpO2 97 %.  Medical Problem List and Plan: 1. Functional deficits secondary to debility secondary to acute on chronic heart failure, NICM, cardiogenic shock status post LVAD.             -patient may not shower to allow for ongoing driveline incorporation             -ELOS/Goals: 10 days          -d/c home 2.  Afib with RVR: continue Coumadin dosing as per pharmacy             -antiplatelet therapy: Aspirin 81 mg daily 3. Pain Management: Tylenol, decrease oxycodone to 49m q12H as needed, Lidoderm patch as needed 4. Depression: continue magnesium oxide.              -continue Zoloft 50 mg daily             -continue melatonin 3 mg nightly             -antipsychotic agents: n/a 5. Neuropsych/cognition: This patient is capable of making decisions on his own behalf. 6. Skin/Wound Care: Routine skin care checks             -patient's caregiver, JJanett Billow trained on every other day driveline dressing changes per VAD coordinator   7. Fluids/Electrolytes/Nutrition: Strict I's and O's and follow-up chemistries             -2 g sodium diet/1500 mL fluid restriction             -continue iron/vitamin supplements/mag ox             -intake is  solid presently   8: Acute on chronic HFrEF/nonischemic cardiomyopathy status post LVAD             -Continue spironolactone 25 mg daily             -Continue digoxin 0.125 mg daily        -Continue torsemide 40 mg twice daily -Continue warfarin 3 mg q 1600 hrs. Continue pharmacy consult/INR -Continue sildenafil 20 mg TID -  Continue Jardiance 10 mg daily -Continue magnesium oxide 400 mg twice daily -Page VAD coordinator for equipment or driveline issues Filed Weights   07/28/22 0348 07/29/22 0455 07/30/22 0532  Weight: 84.5 kg 85.1 kg 82.8 kg   -volme mgt per LVAD team   9: Paroxysmal atrial fibrillation/flutter status post ablation -continue amiodarone 200 mg twice daily -Continue warfarin 3 mg q 1600 hrs.   10: Hospital acquired pneumonia Zosyn>>completed 7 days             -wean supplemental oxygen as able             -pulmonary hygiene   11: Hyponatremia: improved; follow-up BMP             -May eventually need tolvaptan and if persists when euvolemic per cardiology   12: Diabetes mellitus, type II: CBGs before every meal and nightly  CBG (last 3)  Recent Labs    07/30/22 1627 07/30/22 2043 07/31/22 0609  GLUCAP 121* 195* 127*    -12/5 improved control             -continue Jardiance 10 mg daily             -  Semglee  28 units every morning and   22 units nightly             -Decrease NovoLog to 4 units 3 times daily with meals             -Sliding scale insulin             -mag level 2.2 13: Acute kidney injury: creatinine worsening, will check with heart failure team if we can liberalize his fluid restriction to 1866m. Decrease Toresemide to 460mdaily.  14: History of DVT: now on warfarin 15: Hypothyroidism: Continue Synthroid 16: Anemia: Status post 1 unit PRBCs; follow-up CBC             -Left epistaxis status post cauterization, resolved  -no further re-bleeding after afrin/nasal packing  -12/5 ocean nasal spray ordered qid 17: Hyperlipidemia: Continue  Lipitor 20 mg daily 18: GI prophylaxis: Continue Protonix 40 mg daily 19: Partial DNR code status: no chest compressions 20. Screening for vitamin D deficiency:   -vitamin d level in low normal range -started vitamin D supplement 22. Hypocalcemia: calcium supplement started 23. Dizziness: likely secondary to dehydration, torsemide decreased to 4053maily. Resolved, continue this lowered dose 24.  Epistaxis right nostril recurrent - need ENT re eval if spoke to Dr RosConstance Holstero did cautery lst month, he will re eval this am. Discussed that coumadin will increase bleeding risk 25. Hyponatremia: continue fluid restriction   >30 minutes spent in discharge of patient including review of medications and follow-up appointments, physical examination, and in answering all patient's questions  LOS: 10 days A FACE TO FACLamar/06/2022, 10:10 AM

## 2022-08-01 ENCOUNTER — Ambulatory Visit: Payer: Medicaid Other | Admitting: Cardiology

## 2022-08-01 NOTE — Progress Notes (Deleted)
Clinical Summary Mr. Kitner is a 61 y.o.male  seen today for follow up of the following medical problems.    1.Aflutter/Afib - history of afib and aflutter according to prior  cardiologists notes - admit 09/2021 with aflutter with RVR. Issues with hypotension on dilt gtt, started on IV amio and converted to oral prior to discharge. Was back in [place] prior to d/c - has been on eliquis for stroke prevention     - no recent palpitations - no bleeding on eliquis    01/12/22 aflutter ablation with Dr [name] - amio was stopped - no recent palpitations - no bleedign on eliquis         2. Chronic systolic HF/NICM - previously followed at [facility] and [place], prior cath without significant CAD   06/2019 echo [facility]: LVEF <10% 07/2019 [facility] Cath: normal coronaries 03/2021 echo [facility] : LVEF 30%, mild RV dysfunction - 09/2021 echo: LVEF 20-25%, normal RV   - no SOB/DOE, no recent edema - compliant with meds, has not needed lasix prn. - soft bp's have limited med titration        3. AICD - normal device check 01/2022   Past Medical History:  Diagnosis Date   Acute kidney injury ([place])    Acute respiratory failure ([place])    Atrial flutter ([place])    on Eliquis   CHF (congestive heart failure) ([place])    EF 20-25% 10/15/21, AICD in place.   Coronary artery disease    Diabetes mellitus without complication (H[facility])    DVT (deep venous thrombosis) ([place]) 06/2019   Heart murmur    Hypertension    Presence of permanent cardiac pacemaker    Renal disorder      No Known Allergies   Current Outpatient Medications  Medication Sig Dispense Refill   acetaminophen (TYLENOL) 325 MG tablet Take 1-2 tablets (325-650 mg total) by mouth every 4 (four) hours as needed for mild pain.     amiodarone (PACERONE) 200 MG tablet Take 1 tablet (200 mg total) by mouth daily. 30 tablet 0   aspirin 81 MG chewable tablet Chew 1 tablet (81 mg total) by mouth daily for 3 days. 3 tablet 0    atorvastatin (LIPITOR) 20 MG tablet Take 20 mg by mouth daily.     Calcium Carb-Cholecalciferol 600-10 MG-MCG TABS Take 1 tablet by mouth daily with breakfast. 30 tablet 0   digoxin (LANOXIN) 0.125 MG tablet Take 1 tablet (0.125 mg total) by mouth daily. 30 tablet 0   empagliflozin (JARDIANCE) 10 MG TABS tablet Take 1 tablet (10 mg total) by mouth daily. 30 tablet 0   Fe Fum-Vit C-Vit B12-FA (TRIGELS-F FORTE) CAPS capsule Take 1 capsule by mouth daily after breakfast. 30 capsule 0   feeding supplement (ENSURE ENLIVE / ENSURE PLUS) LIQD Take 237 mLs by mouth 3 (three) times daily between meals. 237 mL 12   insulin glargine-yfgn (SEMGLEE) 100 UNIT/ML injection Inject 0.13 mLs (13 Units total) into the skin daily. 10 mL 11   insulin glargine (SEMGLEE, YFGN,) 100 UNIT/ML Solostar Pen Inject 22 Units into the skin at bedtime. 15 mL 0   insulin lispro (HUMALOG KWIKPEN) 100 UNIT/ML KwikPen Use 4 units three times a day with meals 15 mL 0   Insulin Pen Needle (PEN NEEDLES) 32G X 4 MM MISC Use with insulin pen with breakfast, with lunch, and with evening meal. 100 each 0   levothyroxine (SYNTHROID) 25 MCG tablet Take 1 tablet (25 mcg total)  by mouth daily before breakfast.     magnesium oxide (MAG-OX) 400 (240 Mg) MG tablet Take 1 tablet (400 mg total) by mouth 2 (two) times daily. 30 tablet 0   melatonin 3 MG TABS tablet Take 1 tablet (3 mg total) by mouth at bedtime.  0   Multiple Vitamin (MULTIVITAMIN WITH MINERALS) TABS tablet Take 1 tablet by mouth daily.     pantoprazole (PROTONIX) 40 MG tablet Take 1 tablet (40 mg total) by mouth daily. 30 tablet 0   polyethylene glycol (MIRALAX / GLYCOLAX) 17 g packet Take 17 g by mouth daily as needed for mild constipation. 14 each 0   potassium chloride SA (KLOR-CON M) 20 MEQ tablet Take 1 tablet (20 mEq total) by mouth daily. 30 tablet 0   sertraline (ZOLOFT) 50 MG tablet Take 50 mg by mouth daily.     sildenafil (REVATIO) 20 MG tablet Take 1 tablet (20 mg total)  by mouth 3 (three) times daily. 90 tablet 0   sodium chloride (OCEAN) 0.65 % SOLN nasal spray Place 2 sprays into both nostrils 4 (four) times daily.  0   spironolactone (ALDACTONE) 25 MG tablet Take 1 tablet (25 mg total) by mouth daily. 90 tablet 2   torsemide (DEMADEX) 20 MG tablet Take 1 tablet (20 mg total) by mouth daily. 30 tablet 0   warfarin (COUMADIN) 3 MG tablet Take 1 tablet (3 mg total) by mouth daily at 4 PM. 30 tablet 0   No current facility-administered medications for this visit.     Past Surgical History:  Procedure Laterality Date   A-FLUTTER ABLATION N/A 01/12/2022   Procedure: A-FLUTTER ABLATION;  Surgeon: Evans Lance, MD;  Location: [place] [facility];  Service: Cardiovascular;  Laterality: N/A;   COLONOSCOPY     Dr. Posey Pronto; One 4-6 mm hyperplastic rectal polyp, mid left-sided diverticulosis.   CYST REMOVAL NECK     HERNIA REPAIR     INSERTION OF IMPLANTABLE LEFT VENTRICULAR ASSIST DEVICE N/A 07/04/2022   Procedure: INSERTION OF IMPLANTABLE LEFT VENTRICULAR ASSIST DEVICE; ATRICLIP 43;  Surgeon: Neomia Glass, MD;  Location: [place];  Service: Open Heart Surgery;  Laterality: N/A;   RIGHT HEART CATH N/A 06/27/2022   Procedure: RIGHT HEART CATH;  Surgeon: Larey Dresser, MD;  Location: [place] [facility];  Service: Cardiovascular;  Laterality: N/A;   RIGHT HEART CATH N/A 06/29/2022   Procedure: RIGHT HEART CATH;  Surgeon: Larey Dresser, MD;  Location: [place] [facility];  Service: Cardiovascular;  Laterality: N/A;   TEE WITHOUT CARDIOVERSION N/A 07/04/2022   Procedure: TRANSESOPHAGEAL ECHOCARDIOGRAM (TEE);  Surgeon: Neomia Glass, MD;  Location: [place];  Service: Open Heart Surgery;  Laterality: N/A;   TONSILLECTOMY       No Known Allergies    Family History  Problem Relation Age of Onset   Hyperlipidemia Mother    Heart disease Mother        has PPM   Diabetes Mother    Hypertension Mother    Heart disease Maternal Grandmother    Prostate cancer  Maternal Grandfather    Colon cancer Neg Hx    Colon polyps Neg Hx      Social History Mr. Kozikowski reports that he quit smoking about 3 years ago. His smoking use included cigarettes. He has a 20.00 pack-year smoking history. He has never used smokeless tobacco. Mr. Beevers reports that he does not currently use alcohol.   Review of Systems CONSTITUTIONAL: No  weight loss, fever, chills, weakness or fatigue.  HEENT: Eyes: No visual loss, blurred vision, double vision or yellow sclerae.No hearing loss, sneezing, congestion, runny nose or sore throat.  SKIN: No rash or itching.  CARDIOVASCULAR:  RESPIRATORY: No shortness of breath, cough or sputum.  GASTROINTESTINAL: No anorexia, nausea, vomiting or diarrhea. No abdominal pain or blood.  GENITOURINARY: No burning on urination, no polyuria NEUROLOGICAL: No headache, dizziness, syncope, paralysis, ataxia, numbness or tingling in the extremities. No change in bowel or bladder control.  MUSCULOSKELETAL: No muscle, back pain, joint pain or stiffness.  LYMPHATICS: No enlarged nodes. No history of splenectomy.  PSYCHIATRIC: No history of depression or anxiety.  ENDOCRINOLOGIC: No reports of sweating, cold or heat intolerance. No polyuria or polydipsia.  Marland Kitchen   Physical Examination There were no vitals filed for this visit. There were no vitals filed for this visit.  Gen: resting comfortably, no acute distress HEENT: no scleral icterus, pupils equal round and reactive, no palptable cervical adenopathy,  CV Resp: Clear to auscultation bilaterally GI: abdomen is soft, non-tender, non-distended, normal bowel sounds, no hepatosplenomegaly MSK: extremities are warm, no edema.  Skin: warm, no rash Neuro:  no focal deficits Psych: appropriate affect   Diagnostic Studies  Result Date: 10/15/2021    ECHOCARDIOGRAM REPORT   Patient Name:   Phyllis Oshima Date of Exam: 10/15/2021 Medical Rec #:  [id number]    Height:       71.0 in Accession #:    [id number]    Weight:       202.6 lb Date of Birth:  June 20, 1961    BSA:          2.120 m Patient Age:    57 years     BP:           113/77 mmHg Patient Gender: M            HR:           66 bpm. Exam Location:  [facility] Procedure: 2D Echo, Cardiac Doppler, Color Doppler and Intracardiac            Opacification Agent Indications:    Atrial Flutter I48.92  History:        Patient has no prior history of Echocardiogram examinations.                 CHF; Defibrillator.  Sonographer:    Merrie Roof RDCS Referring Phys: [place]  1. Left ventricular ejection fraction, by estimation, is 20 to 25%. The left ventricle has severely decreased function. The left ventricle demonstrates global hypokinesis. The left ventricular internal cavity size was mildly dilated. Left ventricular diastolic function could not be evaluated.  2. Right ventricular systolic function is normal. The right ventricular size is normal. Tricuspid regurgitation signal is inadequate for assessing PA pressure.  3. The pericardial effusion is posterior to the left ventricle.  4. The mitral valve is normal in structure. Trivial mitral valve regurgitation. No evidence of mitral stenosis.  5. The aortic valve is normal in structure. Aortic valve regurgitation is not visualized. No aortic stenosis is present.  6. Aortic dilatation noted. There is mild dilatation of the aortic root, measuring 39 mm. FINDINGS  Left Ventricle: Left ventricular ejection fraction, by estimation, is 20 to 25%. The left ventricle has severely decreased function. The left ventricle demonstrates global hypokinesis. The left ventricular internal cavity size was mildly dilated. There is no left ventricular hypertrophy. Left ventricular diastolic function could not  be evaluated due to atrial fibrillation. Left ventricular diastolic function could not be evaluated. Right Ventricle: The right ventricular size is normal. No increase in right ventricular wall thickness. Right  ventricular systolic function is normal. Tricuspid regurgitation signal is inadequate for assessing PA pressure. Left Atrium: Left atrial size was normal in size. Right Atrium: Right atrial size was normal in size. Pericardium: Trivial pericardial effusion is present. The pericardial effusion is posterior to the left ventricle. Mitral Valve: The mitral valve is normal in structure. Trivial mitral valve regurgitation. No evidence of mitral valve stenosis. Tricuspid Valve: The tricuspid valve is normal in structure. Tricuspid valve regurgitation is trivial. No evidence of tricuspid stenosis. Aortic Valve: The aortic valve is normal in structure. Aortic valve regurgitation is not visualized. No aortic stenosis is present. Aortic valve mean gradient measures 2.0 mmHg. Aortic valve peak gradient measures 3.7 mmHg. Aortic valve area, by VTI measures 2.21 cm. Pulmonic Valve: The pulmonic valve was normal in structure. Pulmonic valve regurgitation is not visualized. No evidence of pulmonic stenosis. Aorta: Aortic dilatation noted. There is mild dilatation of the aortic root, measuring 39 mm. Venous: The inferior vena cava was not well visualized. IAS/Shunts: No atrial level shunt detected by color flow Doppler. Additional Comments: A device lead is visualized.  LEFT VENTRICLE PLAX 2D LVIDd:         5.80 cm LVIDs:         5.40 cm LV PW:         1.10 cm LV IVS:        0.90 cm LVOT diam:     2.00 cm LV SV:         36 LV SV Index:   17 LVOT Area:     3.14 cm  RIGHT VENTRICLE RV Basal diam:  3.40 cm RV S prime:     7.94 cm/s TAPSE (M-mode): 1.2 cm LEFT ATRIUM             Index        RIGHT ATRIUM           Index LA diam:        3.30 cm 1.56 cm/m   RA Area:     16.70 cm LA Vol (A2C):   51.3 ml 24.20 ml/m  RA Volume:   48.10 ml  22.69 ml/m LA Vol (A4C):   45.6 ml 21.51 ml/m LA Biplane Vol: 50.1 ml 23.63 ml/m  AORTIC VALVE AV Area (Vmax):    1.95 cm AV Area (Vmean):   1.79 cm AV Area (VTI):     2.21 cm AV Vmax:            96.50 cm/s AV Vmean:          64.600 cm/s AV VTI:            0.165 m AV Peak Grad:      3.7 mmHg AV Mean Grad:      2.0 mmHg LVOT Vmax:         60.00 cm/s LVOT Vmean:        36.800 cm/s LVOT VTI:          0.116 m LVOT/AV VTI ratio: 0.70  AORTA Ao Root diam: 3.90 cm Ao Asc diam:  3.20 cm  SHUNTS Systemic VTI:  0.12 m Systemic Diam: 2.00 cm Fransico Him MD Electronically signed by Fransico Him MD Signature Date/Time: 10/15/2021/11:47:19 AM    Final          Assessment and Plan  1.Aflutter/afib/acquired thrombophilia - s/p aflutter ablation with Dr [name], amio discontinued around that time - no recent symptoms - continue current meds including eliquis for stroke prevention   2. Chronic systolic HF/NICM - previously followed at [facility] and [place] - no recent symptoms - medical therapy titration limited by soft bp's - given persistent severe systolic dysfunction will have him establish with HF clinic in [place] continue to follow with EP     Arnoldo Lenis, M.D., F.A.C.C.

## 2022-08-03 ENCOUNTER — Encounter (HOSPITAL_COMMUNITY): Payer: Medicaid Other | Admitting: Cardiology

## 2022-08-04 ENCOUNTER — Other Ambulatory Visit (HOSPITAL_COMMUNITY): Payer: Self-pay

## 2022-08-04 ENCOUNTER — Other Ambulatory Visit: Payer: Self-pay

## 2022-08-04 DIAGNOSIS — Z95811 Presence of heart assist device: Secondary | ICD-10-CM

## 2022-08-04 DIAGNOSIS — Z7901 Long term (current) use of anticoagulants: Secondary | ICD-10-CM

## 2022-08-07 ENCOUNTER — Ambulatory Visit (HOSPITAL_COMMUNITY)
Admit: 2022-08-07 | Discharge: 2022-08-07 | Disposition: A | Discharge status: Home / Self Care | Payer: Medicare Other | Attending: Cardiology | Admitting: Cardiology

## 2022-08-07 ENCOUNTER — Ambulatory Visit (INDEPENDENT_AMBULATORY_CARE_PROVIDER_SITE_OTHER): Payer: Medicare Other

## 2022-08-07 ENCOUNTER — Ambulatory Visit (HOSPITAL_COMMUNITY): Payer: Self-pay | Admitting: Pharmacist

## 2022-08-07 ENCOUNTER — Encounter (HOSPITAL_COMMUNITY): Payer: Self-pay | Admitting: Cardiology

## 2022-08-07 DIAGNOSIS — I5022 Chronic systolic (congestive) heart failure: Secondary | ICD-10-CM

## 2022-08-07 DIAGNOSIS — I4892 Unspecified atrial flutter: Secondary | ICD-10-CM | POA: Insufficient documentation

## 2022-08-07 DIAGNOSIS — Z4801 Encounter for change or removal of surgical wound dressing: Secondary | ICD-10-CM | POA: Insufficient documentation

## 2022-08-07 DIAGNOSIS — Z87891 Personal history of nicotine dependence: Secondary | ICD-10-CM | POA: Insufficient documentation

## 2022-08-07 DIAGNOSIS — Z8249 Family history of ischemic heart disease and other diseases of the circulatory system: Secondary | ICD-10-CM | POA: Insufficient documentation

## 2022-08-07 DIAGNOSIS — Z95811 Presence of heart assist device: Secondary | ICD-10-CM | POA: Insufficient documentation

## 2022-08-07 DIAGNOSIS — Z7901 Long term (current) use of anticoagulants: Secondary | ICD-10-CM | POA: Diagnosis not present

## 2022-08-07 DIAGNOSIS — I428 Other cardiomyopathies: Secondary | ICD-10-CM | POA: Insufficient documentation

## 2022-08-07 DIAGNOSIS — J95821 Acute postprocedural respiratory failure: Secondary | ICD-10-CM | POA: Diagnosis not present

## 2022-08-07 DIAGNOSIS — I4891 Unspecified atrial fibrillation: Secondary | ICD-10-CM | POA: Insufficient documentation

## 2022-08-07 DIAGNOSIS — I480 Paroxysmal atrial fibrillation: Secondary | ICD-10-CM | POA: Diagnosis not present

## 2022-08-07 DIAGNOSIS — E1122 Type 2 diabetes mellitus with diabetic chronic kidney disease: Secondary | ICD-10-CM | POA: Insufficient documentation

## 2022-08-07 LAB — CBC
HCT: 29.7 % — ABNORMAL LOW (ref 39.0–52.0)
Hemoglobin: 9.1 g/dL — ABNORMAL LOW (ref 13.0–17.0)
MCH: 25.9 pg — ABNORMAL LOW (ref 26.0–34.0)
MCHC: 30.6 g/dL (ref 30.0–36.0)
MCV: 84.4 fL (ref 80.0–100.0)
Platelets: 555 10*3/uL — ABNORMAL HIGH (ref 150–400)
RBC: 3.52 MIL/uL — ABNORMAL LOW (ref 4.22–5.81)
RDW: 17.6 % — ABNORMAL HIGH (ref 11.5–15.5)
WBC: 9.2 10*3/uL (ref 4.0–10.5)
nRBC: 0 % (ref 0.0–0.2)

## 2022-08-07 LAB — BASIC METABOLIC PANEL
Anion gap: 11 (ref 5–15)
BUN: 19 mg/dL (ref 6–20)
CO2: 23 mmol/L (ref 22–32)
Calcium: 9 mg/dL (ref 8.9–10.3)
Chloride: 99 mmol/L (ref 98–111)
Creatinine, Ser: 1.17 mg/dL (ref 0.61–1.24)
GFR, Estimated: >60 mL/min (ref >60)
Glucose, Bld: 202 mg/dL — ABNORMAL HIGH (ref 70–99)
Potassium: 3.9 mmol/L (ref 3.5–5.1)
Sodium: 133 mmol/L — ABNORMAL LOW (ref 135–145)

## 2022-08-07 LAB — PROTIME-INR
INR: 1.6 — ABNORMAL HIGH (ref 0.8–1.2)
Prothrombin Time: 18.8 seconds — ABNORMAL HIGH (ref 11.4–15.2)

## 2022-08-07 LAB — DIGOXIN LEVEL: Digoxin Level: 1.1 ng/mL (ref 0.8–2.0)

## 2022-08-07 LAB — LACTATE DEHYDROGENASE: LDH: 211 U/L — ABNORMAL HIGH (ref 98–192)

## 2022-08-07 MED ORDER — DIGOXIN 125 MCG PO TABS: 0.0625 mg | ORAL_TABLET | Freq: Every day | ORAL | 0 refills | Status: DC

## 2022-08-07 MED ORDER — TORSEMIDE 20 MG PO TABS: ORAL_TABLET | ORAL | 0 refills | Status: DC

## 2022-08-07 MED ORDER — POTASSIUM CHLORIDE CRYS ER 20 MEQ PO TBCR: 40.0000 meq | EXTENDED_RELEASE_TABLET | Freq: Every day | ORAL | 0 refills | Status: DC

## 2022-08-07 NOTE — Progress Notes (Addendum)
Patient presents for hospital follow up in [facility] today caregiver [name] in wheelchair. Reports no problems with VAD equipment or concerns with drive line.  Patient states he feels as if he is adjusting well. He endorses a good appetite and adequate hydration. Denies lightheadedness, dizziness, falls and signs of bleeding. He states he occasionally gets short of breath with activity and is adjusting to the weight of the equipment as it is "heavy". He states he is only moving from bathroom to kitchen. Dr. Aundra Dubin encourage more activity throughout the day to combat deconditioning. Patient states he received phone call to schedule therapy at [facility] but has not called them back. Patient due to to be cleared from sternal precautions 09/03/2022 and able to start cardiac rehab then. VAD Coordinators will follow and reach out to cardiac rehab when sternal precautions are cleared.  Nasal packing was removed 12/14. Patient reports no epistaxis since and use of nasal spray as prescribed by Dr.Rosen.  Medications reviewed today. Patient reports no problem with current medication regimen.   Per Dr.Jearl Soto will increase Torsemide to 40/20 for 2 days then resume at 40mg  daily and to increase Potassium to 68meq daily. Also, reduced Digoxin to 0.0625mg  will obtain Digoxin level next visit. MD would like patient to return in a week and received a CMET and thyroid panel at this visit.  Vital Signs:  Doppler Pressure: 84 Automatc BP: 110/63 (77) HR:  88 SPO2: 95 % RA   Weight: 195.2 lb w/ eqt Last weight: 182.5 lb w/o ept BMI today 27 today  VAD Indication: Destination Therapy due to uncontrolled diabetes   LVAD assessment: HM III: VAD Speed: 5400 rpms Flow: 4.7 Power: 4.0 w    PI: 2.9 Alarms: no clinical alarms  Events:rare  Fixed speed: 5400 Low speed limit: 5100  Primary controller: back up battery due for replacement in  26 months Secondary controller:  back up battery due for replacement  in 26  months    I reviewed the LVAD parameters from today and compared the results to the patient's prior recorded data. LVAD interrogation was NEGATIVE for significant power changes, NEGATIVE for clinical alarms and STABLE for PI events/speed drops. No programming changes were made and pump is functioning within specified parameters. Pt is performing daily controller and system monitor self tests along with completing weekly and monthly maintenance for LVAD equipment.   LVAD equipment check completed and is in good working order. Back-up equipment present. Charged back up battery and performed self-test on equipment.    Annual Equipment Maintenance on UBC/PM was performed on 06/2022.   Education: Discussed with patient and [name] the need to notify the VAD coordinator of any change in VAD numbers from baseline, any alarms (other than low voltage) and any concerns he/she may have about the device, equipment or percutaneous lead. Confirmed that both patient and [name]  have emergency contact information. Patient if aware that there is someone available 24/7 to assist with any VAD issues. He/she has been instructed to call 911 for any life-threatening emergencies first then page/call VAD coordinator for assistance with the VAD.   Patient was instructed of importance of protecting percutaneous lead from trauma. Discussed importance of not allowing lead to become bent, twisted, pulled on, torn, or damaged in any way.  He/she is aware that the anchor should always be worn to protect percutaneous lead as well as to prevent system controller from being dropped or falling. Educated patient that trauma to driveline insertion site is major  cause of driveline infection and must be avoided at all times.  Patient was informed he MUST always have back-up system controller as well as extra batteries with him at all times. This is considered an absolute requirement necessary for his safety and well-being.   Exit  Site Care:  VAD dressing and anchor removed and site care performed using sterile technique. Drive line exit site cleaned with Chlora prep applicators x 2, allowed to dry, skin protectant applied and allowed to dry before gauze dressing and Aquacel silver strip re-applied. Exit site well healing, site partially incorporated.The velour is fully implanted at exit site. 1 black suture remains intact will consider removing at next appointment. Scant amount of dried serous drainage on previous dressing/silver strip. No tenderness,or foul odor noted. Drive line anchor re-applied. Pt denies fever or chills. Driveline dressing is being changed twice a week per sterile technique by caregiver [name]. Patient given sterile Aquacel silver strips for at home use.    Significant Events on VAD Support:  none   Device: N/A    BP & Labs:  MAP 84 - Doppler is reflecting MAP   Hgb 9.1 - No S/S of bleeding. Specifically denies melena/BRBPR or nosebleeds.   LDH stable at 211 -Denies tea-colored urine. No power elevations noted on interrogation.   Patient Instructions:  Increase Torsemide to 40/20 x 2 days (2 tablets in AM; 1 tablet evening) then reduce to 40 mg daily (2 tablets) Increase Potassium to 8meq daily (2 tablets) Next visit obtain CMET and thyroid panel Return to clinic next week for follow up   Bobbye Morton, RN,BSN VAD Coordinator    Office: [phone] 24/7 Emergency VAD Pager: [phone]     Follow up for Heart Failure/LVAD:  Mr Orsino is a 61 y.o. with history of atrial fibrillation/flutter and nonischemic cardiomyopathy who was initially referred by Dr. Harl Bowie for evaluation of CHF.  Patient was diagnosed with CHF in 2020 at [facility] in [place].  At the time, he drank heavily and used cocaine.  He stopped drinking, smoking, and cocaine in 2020 and has not used any of these substances since then. Echo in 11/20 showed EF < 10%, cath in 12/20 showed normal coronaries.  Echo has shown  persistently low EF over time, echo in 2/23 showed EF 20-25%.  He has a Medtronic ICD.      Patient was admitted in 2/23 with atrial flutter/RVR. He was started on amiodarone and converted to NSR.  He had atrial flutter ablation in 5/23 and amiodarone was stopped  Echo was done in 11/23 showing elevated filling pressure and markedly low cardiac output.  Patient was admitted, started on milrinone, and underwent HM3 LVAD placement with LAA clipping later in 11/23.  Post-op course complicated by acute hypoxemic respiratory failure post-op and PNA.  Patient went to CIR after acute hospital course.   Patient is now home.  He is on room air.  He still has exertional dyspnea with walking across the house.  No lightheadedness.  No further epistaxis.  He has not been out much, he has been to a store once.   Denies LVAD alarms, few PIs noted.  Denies driveline trauma, erythema or drainage.  Denies ICD shocks.  Reports taking Coumadin as prescribed and adherence to anticoagulation based dietary restrictions.  Denies bright red blood per rectum or melena, no dark urine or hematuria.    Labs (12/23): K 4.3, creatinine 1.25, Na 128, hgb 8.8  PMH: 1. Atrial fibrillation/flutter: Mainly flutter.  - 5/23  Atrial flutter ablation.  2. H/o DVT 3. Chronic systolic CHF: Nonischemic cardiomyopathy.  Medtronic ICD.  - Echo (11/20): EF < 10% - Cath (12/20): Normal coronaries.  - Echo (8/22): EF 30%, mildly decreased RV systolic function.  - Echo (2/23): EF 20-25%, normal RV.  - RHC (11/23): mean RA 14, PA 66/53 mean 54, mean PCWP 43, CI 1.62 F/1.31 T - HM3 LVAD with LAA clipping on 07/04/22.  4. Prior cocaine abuse.  5. Prior heavy ETOH   SH: Patient was a heavy drinker, abused cocaine, and smoked in the past but quit in 2020.  Lives in [place]. He does part-time custodial work.    FH: Mother with PPM, aunt with CHF.    ROS: All systems negative except as listed in HPI, PMH and Problem List.  Current  Outpatient Medications  Medication Sig Dispense Refill   acetaminophen (TYLENOL) 325 MG tablet Take 1-2 tablets (325-650 mg total) by mouth every 4 (four) hours as needed for mild pain.     amiodarone (PACERONE) 200 MG tablet Take 1 tablet (200 mg total) by mouth daily. 30 tablet 0   atorvastatin (LIPITOR) 20 MG tablet Take 20 mg by mouth daily.     Calcium Carb-Cholecalciferol 600-10 MG-MCG TABS Take 1 tablet by mouth daily with breakfast. 30 tablet 0   empagliflozin (JARDIANCE) 10 MG TABS tablet Take 1 tablet (10 mg total) by mouth daily. 30 tablet 0   Fe Fum-Vit C-Vit B12-FA (TRIGELS-F FORTE) CAPS capsule Take 1 capsule by mouth daily after breakfast. 30 capsule 0   insulin glargine (SEMGLEE, YFGN,) 100 UNIT/ML Solostar Pen Inject 22 Units into the skin at bedtime. 15 mL 0   insulin glargine-yfgn (SEMGLEE) 100 UNIT/ML injection Inject 0.13 mLs (13 Units total) into the skin daily. 10 mL 11   insulin lispro (HUMALOG KWIKPEN) 100 UNIT/ML KwikPen Use 4 units three times a day with meals 15 mL 0   Insulin Pen Needle (PEN NEEDLES) 32G X 4 MM MISC Use with insulin pen with breakfast, with lunch, and with evening meal. 100 each 0   levothyroxine (SYNTHROID) 25 MCG tablet Take 1 tablet (25 mcg total) by mouth daily before breakfast.     magnesium oxide (MAG-OX) 400 (240 Mg) MG tablet Take 1 tablet (400 mg total) by mouth 2 (two) times daily. 30 tablet 0   pantoprazole (PROTONIX) 40 MG tablet Take 1 tablet (40 mg total) by mouth daily. 30 tablet 0   sertraline (ZOLOFT) 50 MG tablet Take 50 mg by mouth daily.     sildenafil (REVATIO) 20 MG tablet Take 1 tablet (20 mg total) by mouth 3 (three) times daily. 90 tablet 0   sodium chloride (OCEAN) 0.65 % SOLN nasal spray Place 2 sprays into both nostrils 4 (four) times daily.  0   spironolactone (ALDACTONE) 25 MG tablet Take 1 tablet (25 mg total) by mouth daily. 90 tablet 2   warfarin (COUMADIN) 3 MG tablet Take 1 tablet (3 mg total) by mouth daily at 4 PM.  30 tablet 0   digoxin (LANOXIN) 0.125 MG tablet Take 0.5 tablets (0.0625 mg total) by mouth daily. 30 tablet 0   melatonin 3 MG TABS tablet Take 1 tablet (3 mg total) by mouth at bedtime. (Patient not taking: Reported on 08/07/2022)  0   Multiple Vitamin (MULTIVITAMIN WITH MINERALS) TABS tablet Take 1 tablet by mouth daily. (Patient not taking: Reported on 08/07/2022)     polyethylene glycol (MIRALAX / GLYCOLAX) 17 g packet Take 17  g by mouth daily as needed for mild constipation. (Patient not taking: Reported on 08/07/2022) 14 each 0   potassium chloride SA (KLOR-CON M) 20 MEQ tablet Take 2 tablets (40 mEq total) by mouth daily. 30 tablet 0   torsemide (DEMADEX) 20 MG tablet 40/20 for 2 days; then 40mg  daily after 30 tablet 0   No current facility-administered medications for this encounter.    Patient has no known allergies.  REVIEW OF SYSTEMS: All systems negative except as listed in HPI, PMH and Problem list.   LVAD INTERROGATION:  See LVAD nurse's note above.   I reviewed the LVAD parameters from today, and compared the results to the patient's prior recorded data.  No programming changes were made.  The LVAD is functioning within specified parameters.  The patient performs LVAD self-test daily.  LVAD interrogation was negative for any significant power changes, alarms or PI events/speed drops.  LVAD equipment check completed and is in good working order.  Back-up equipment present.   LVAD education done on emergency procedures and precautions and reviewed exit site care.    Vitals:   08/07/22 1313 08/07/22 1314  BP: 110/63 (!) 84/0  Pulse: 88   Weight: 88.5 kg (195 lb 3.2 oz)     Physical Exam: GENERAL: Well appearing, male who presents to clinic today in no acute distress. HEENT: normal  NECK: Supple, JVP 10-12 cm.  2+ bilaterally, no bruits.  No lymphadenopathy or thyromegaly appreciated.   CARDIAC:  Mechanical heart sounds with LVAD hum present.  LUNGS:  Clear to  auscultation bilaterally.  ABDOMEN:  Soft, round, nontender, positive bowel sounds x4.   Mild distention.  LVAD exit site: well-healed and incorporated.  Dressing dry and intact.  No erythema or drainage.  Stabilization device present and accurately applied.  Driveline dressing is being changed daily per sterile technique. EXTREMITIES:  Warm and dry, no cyanosis, clubbing, rash or edema  NEUROLOGIC:  Alert and oriented x 4.  Gait steady.  No aphasia.  No dysarthria.  Affect pleasant.     ASSESSMENT AND PLAN:   1. Chronic systolic CHF: Nonischemic cardiomyopathy, diagnosed 2020.  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  Cath in 12/20 with no significant coronary disease.  Medtronic ICD. Echo in 2/23 showed EF 20-25% with normal RV.  [place] in 11/13 showed markedly elevated filling pressures, primarily pulmonary venous hypertension, low cardiac output, and low PAPI. Echo in 11/23 with EF <20%, mod RV dysfunction.  He was admitted and started on milrinone with improvement in hemodynamics.  We proceeded with HM3 LVAD with LAA clip on 07/04/22.  NYHA class III symptoms still and he remains volume overloaded. MAP 77.  - Increase torsemide to 40 qam/20 qpm x 2 days then 40 mg daily.  Increase KCl to 40 daily.  BMET today and in 10 days.  - Continue Jardiance 10 mg daily.  - Continue sildenafil 20 tid for RV.  - Continue spironolactone 25 mg daily.  - Continue digoxin 0.125 daily, check level today.  - Will refer for cardiac rehab.  2. Atrial fibrillation/atrial flutter: H/o flutter ablation.  Had AF/RVR post-LVAD, went back into NSR.  - Continue amiodarone 200 mg daily, check LFTs and TSH.  He will need regular eye exam.  Would like to stop amiodarone if no arrhythmias after a  couple more months.  - Continue warfarin.  3. VAD  management: s/p HM-3 VAD on 07/04/22. Ramp echo 07/12/22 RV mild to mod HK. Speed turned to 5400. MAP 77 today.  No alarms, rare PIs.  - Check LDH.  - Now off ASA.  - Continue warfarin.  - Will repeat ramp echo eventually.  - VAD interrogated personally. Parameters stable. 4. H/o DVT: On anticoagulation.  5. Acute hypoxemic respiratory failure, post-op: Resolved.  On room air  Continue incentive spirometry at home.  6. CKD stage 3: On Jardiance.  - BMET today.  7. Hypothyroidism - On levothyroxine.  8. Anemia: post-op.  - CBC today.  9. Hyponatremia: Hypervolemic.  Continue to limit po fluid intake.   Loralie Champagne 08/07/2022

## 2022-08-07 NOTE — Progress Notes (Signed)
LVAD INR 

## 2022-08-07 NOTE — Patient Instructions (Addendum)
Increase Torsemide to 40/20 x 2 days (2 tablets in AM; 1 tablet evening) then reduce to 40 mg daily (2 tablets) Increase Potassium to 28meq daily (2 tablets) Call cardiac rehab to schedule therapy Return to clinic 12/26 1100

## 2022-08-10 ENCOUNTER — Telehealth: Payer: Self-pay

## 2022-08-10 LAB — CUP PACEART REMOTE DEVICE CHECK
Battery Remaining Longevity: 107 mo
Battery Voltage: 3 V
Brady Statistic AP VP Percent: 0 %
Brady Statistic AP VS Percent: 0 %
Brady Statistic AS VP Percent: 0.07 %
Brady Statistic AS VS Percent: 99.92 %
Brady Statistic RA Percent Paced: 0 %
Brady Statistic RV Percent Paced: 0.08 %
Date Time Interrogation Session: 20231220222646
HighPow Impedance: 63 Ohm
Implantable Lead Connection Status: 753985
Implantable Lead Implant Date: 20210929
Implantable Lead Location: 753860
Implantable Pulse Generator Implant Date: 20210929
Lead Channel Impedance Value: 399 Ohm
Lead Channel Impedance Value: 418 Ohm
Lead Channel Impedance Value: 475 Ohm
Lead Channel Pacing Threshold Amplitude: 0.625 V
Lead Channel Pacing Threshold Amplitude: 2.375 V
Lead Channel Pacing Threshold Pulse Width: 0.4 ms
Lead Channel Pacing Threshold Pulse Width: 0.4 ms
Lead Channel Sensing Intrinsic Amplitude: 3.5 mV
Lead Channel Sensing Intrinsic Amplitude: 3.5 mV
Lead Channel Sensing Intrinsic Amplitude: 4.25 mV
Lead Channel Sensing Intrinsic Amplitude: 4.25 mV
Lead Channel Setting Pacing Amplitude: 1.5 V
Lead Channel Setting Pacing Amplitude: 5 V
Lead Channel Setting Pacing Pulse Width: 1 ms
Lead Channel Setting Sensing Sensitivity: 0.3 mV
Zone Setting Status: 755011
Zone Setting Status: 755011

## 2022-08-10 NOTE — Telephone Encounter (Signed)
Reviewed with A. Tillery PA-C who is aware of RV pacing threshold high output.  Notes that in certain percentage of patient's with LVAD's this can happen.  Programmed appropriately on 11/30 and is okay to wait until he sees  Dr. [name] in January.   Nothing further at this point.

## 2022-08-10 NOTE — Telephone Encounter (Signed)
Received the following transmission:  Scheduled remote reviewed. Normal device function.   RV threshold at high output - route to triage Optivol crossed threshold 11/17 and is ongoing Next remote 91 days. LR   Note: RV threshold is in high output.  [place] with [employer] noted elevated pacing thresholds as well with interrogation on 07/20/22 which he reviewed with A.Tillery, PA-C at that time. It was determined that this can happen sometimes with the use of an LVAD.  Programmed output currently set at 5.0V @ 1.9ms.  Patient has LVAD.  Sees Dr. [name] on 09/08/22.  Also of note:  Optivol (see below) has been elevated since end of November as well.  Will flag this for Dr. Aundra Dubin.

## 2022-08-11 ENCOUNTER — Telehealth (HOSPITAL_COMMUNITY): Payer: Self-pay | Admitting: Unknown Physician Specialty

## 2022-08-11 NOTE — Telephone Encounter (Signed)
Received call from pt stating that he has developed an itchy rash on his back and legs. Pt states that the rash is no where else on his body. Pt tells me that he hasn't changed his laundry detergent but that he slept in a different bed. D/w Dr Aundra Dubin, pt was instructed that he can take Benadryl as he needs it and that if the rash gets worse to please page the [place] pager over the weekend. Otherwise, we will see the pt in clinic on Thursday. Pt verbalized understanding.   Tanda Rockers RN, BSN VAD Coordinator 24/7 Pager [phone]

## 2022-08-15 ENCOUNTER — Encounter (HOSPITAL_COMMUNITY): Payer: Medicaid Other

## 2022-08-16 ENCOUNTER — Other Ambulatory Visit (HOSPITAL_COMMUNITY): Payer: Self-pay | Admitting: Unknown Physician Specialty

## 2022-08-16 DIAGNOSIS — Z95811 Presence of heart assist device: Secondary | ICD-10-CM

## 2022-08-16 DIAGNOSIS — Z7901 Long term (current) use of anticoagulants: Secondary | ICD-10-CM

## 2022-08-17 ENCOUNTER — Ambulatory Visit (HOSPITAL_COMMUNITY): Payer: Self-pay | Admitting: Pharmacist

## 2022-08-17 ENCOUNTER — Ambulatory Visit (HOSPITAL_COMMUNITY)
Admission: RE | Admit: 2022-08-17 | Discharge: 2022-08-17 | Disposition: A | Discharge status: Home / Self Care | Payer: Medicare Other | Source: Ambulatory Visit | Attending: Internal Medicine | Admitting: Internal Medicine

## 2022-08-17 VITALS — BP 95/68 | HR 85 | Wt 191.6 lb

## 2022-08-17 DIAGNOSIS — I4891 Unspecified atrial fibrillation: Secondary | ICD-10-CM | POA: Insufficient documentation

## 2022-08-17 DIAGNOSIS — Z95811 Presence of heart assist device: Secondary | ICD-10-CM | POA: Insufficient documentation

## 2022-08-17 DIAGNOSIS — L309 Dermatitis, unspecified: Secondary | ICD-10-CM | POA: Insufficient documentation

## 2022-08-17 DIAGNOSIS — E871 Hypo-osmolality and hyponatremia: Secondary | ICD-10-CM | POA: Insufficient documentation

## 2022-08-17 DIAGNOSIS — Z86718 Personal history of other venous thrombosis and embolism: Secondary | ICD-10-CM | POA: Diagnosis not present

## 2022-08-17 DIAGNOSIS — N183 Chronic kidney disease, stage 3 unspecified: Secondary | ICD-10-CM | POA: Insufficient documentation

## 2022-08-17 DIAGNOSIS — Z794 Long term (current) use of insulin: Secondary | ICD-10-CM | POA: Insufficient documentation

## 2022-08-17 DIAGNOSIS — I4892 Unspecified atrial flutter: Secondary | ICD-10-CM | POA: Diagnosis not present

## 2022-08-17 DIAGNOSIS — F418 Other specified anxiety disorders: Secondary | ICD-10-CM

## 2022-08-17 DIAGNOSIS — Z7984 Long term (current) use of oral hypoglycemic drugs: Secondary | ICD-10-CM | POA: Diagnosis not present

## 2022-08-17 DIAGNOSIS — E1122 Type 2 diabetes mellitus with diabetic chronic kidney disease: Secondary | ICD-10-CM | POA: Diagnosis not present

## 2022-08-17 DIAGNOSIS — I428 Other cardiomyopathies: Secondary | ICD-10-CM | POA: Insufficient documentation

## 2022-08-17 DIAGNOSIS — D649 Anemia, unspecified: Secondary | ICD-10-CM | POA: Diagnosis not present

## 2022-08-17 DIAGNOSIS — R21 Rash and other nonspecific skin eruption: Secondary | ICD-10-CM

## 2022-08-17 DIAGNOSIS — E039 Hypothyroidism, unspecified: Secondary | ICD-10-CM | POA: Diagnosis not present

## 2022-08-17 DIAGNOSIS — I5022 Chronic systolic (congestive) heart failure: Secondary | ICD-10-CM | POA: Insufficient documentation

## 2022-08-17 DIAGNOSIS — I10 Essential (primary) hypertension: Secondary | ICD-10-CM

## 2022-08-17 DIAGNOSIS — I272 Pulmonary hypertension, unspecified: Secondary | ICD-10-CM | POA: Diagnosis not present

## 2022-08-17 DIAGNOSIS — Z7901 Long term (current) use of anticoagulants: Secondary | ICD-10-CM | POA: Diagnosis not present

## 2022-08-17 DIAGNOSIS — Z79899 Other long term (current) drug therapy: Secondary | ICD-10-CM | POA: Insufficient documentation

## 2022-08-17 DIAGNOSIS — Z79890 Hormone replacement therapy (postmenopausal): Secondary | ICD-10-CM | POA: Insufficient documentation

## 2022-08-17 DIAGNOSIS — I509 Heart failure, unspecified: Secondary | ICD-10-CM

## 2022-08-17 DIAGNOSIS — I480 Paroxysmal atrial fibrillation: Secondary | ICD-10-CM

## 2022-08-17 LAB — COMPREHENSIVE METABOLIC PANEL
ALT: 20 U/L (ref 0–44)
AST: 19 U/L (ref 15–41)
Albumin: 3.2 g/dL — ABNORMAL LOW (ref 3.5–5.0)
Alkaline Phosphatase: 130 U/L — ABNORMAL HIGH (ref 38–126)
Anion gap: 11 (ref 5–15)
BUN: 25 mg/dL — ABNORMAL HIGH (ref 6–20)
CO2: 24 mmol/L (ref 22–32)
Calcium: 9.2 mg/dL (ref 8.9–10.3)
Chloride: 100 mmol/L (ref 98–111)
Creatinine, Ser: 1.33 mg/dL — ABNORMAL HIGH (ref 0.61–1.24)
GFR, Estimated: >60 mL/min (ref >60)
Glucose, Bld: 109 mg/dL — ABNORMAL HIGH (ref 70–99)
Potassium: 4.1 mmol/L (ref 3.5–5.1)
Sodium: 135 mmol/L (ref 135–145)
Total Bilirubin: 0.3 mg/dL (ref 0.3–1.2)
Total Protein: 8.4 g/dL — ABNORMAL HIGH (ref 6.5–8.1)

## 2022-08-17 LAB — CBC
HCT: 31.4 % — ABNORMAL LOW (ref 39.0–52.0)
Hemoglobin: 9.5 g/dL — ABNORMAL LOW (ref 13.0–17.0)
MCH: 24.2 pg — ABNORMAL LOW (ref 26.0–34.0)
MCHC: 30.3 g/dL (ref 30.0–36.0)
MCV: 79.9 fL — ABNORMAL LOW (ref 80.0–100.0)
Platelets: 563 10*3/uL — ABNORMAL HIGH (ref 150–400)
RBC: 3.93 MIL/uL — ABNORMAL LOW (ref 4.22–5.81)
RDW: 18.5 % — ABNORMAL HIGH (ref 11.5–15.5)
WBC: 9.2 10*3/uL (ref 4.0–10.5)
nRBC: 0 % (ref 0.0–0.2)

## 2022-08-17 LAB — PROTIME-INR
INR: 2 — ABNORMAL HIGH (ref 0.8–1.2)
Prothrombin Time: 22.5 seconds — ABNORMAL HIGH (ref 11.4–15.2)

## 2022-08-17 LAB — TSH: TSH: 4.362 u[IU]/mL (ref 0.350–4.500)

## 2022-08-17 LAB — DIGOXIN LEVEL: Digoxin Level: 0.8 ng/mL (ref 0.8–2.0)

## 2022-08-17 LAB — LACTATE DEHYDROGENASE: LDH: 235 U/L — ABNORMAL HIGH (ref 98–192)

## 2022-08-17 LAB — T4, FREE: Free T4: 1.28 ng/dL — ABNORMAL HIGH (ref 0.61–1.12)

## 2022-08-17 MED ORDER — CALCIUM CARB-CHOLECALCIFEROL 600-10 MG-MCG PO TABS: 1.0000 | ORAL_TABLET | Freq: Every day | ORAL | 3 refills | Status: AC

## 2022-08-17 MED ORDER — FE FUM-VIT C-VIT B12-FA 460-60-0.01-1 MG PO CAPS: 1.0000 | ORAL_CAPSULE | Freq: Every day | ORAL | 6 refills | Status: AC

## 2022-08-17 MED ORDER — WARFARIN SODIUM 3 MG PO TABS: 3.0000 mg | ORAL_TABLET | Freq: Every day | ORAL | 3 refills | Status: DC

## 2022-08-17 MED ORDER — DIGOXIN 125 MCG PO TABS: 0.0625 mg | ORAL_TABLET | Freq: Every day | ORAL | 5 refills | Status: DC

## 2022-08-17 MED ORDER — POTASSIUM CHLORIDE CRYS ER 20 MEQ PO TBCR: 40.0000 meq | EXTENDED_RELEASE_TABLET | Freq: Every day | ORAL | 3 refills | Status: DC

## 2022-08-17 MED ORDER — ATORVASTATIN CALCIUM 20 MG PO TABS: 20.0000 mg | ORAL_TABLET | Freq: Every day | ORAL | 3 refills | Status: DC

## 2022-08-17 MED ORDER — SERTRALINE HCL 50 MG PO TABS: 50.0000 mg | ORAL_TABLET | Freq: Every day | ORAL | 3 refills | Status: DC

## 2022-08-17 MED ORDER — HYDROCORTISONE 1 % EX CREA: 1.0000 | TOPICAL_CREAM | Freq: Two times a day (BID) | CUTANEOUS | 6 refills | Status: DC

## 2022-08-17 MED ORDER — SPIRONOLACTONE 25 MG PO TABS: 25.0000 mg | ORAL_TABLET | Freq: Every day | ORAL | 3 refills | Status: DC

## 2022-08-17 MED ORDER — EMPAGLIFLOZIN 10 MG PO TABS: 10.0000 mg | ORAL_TABLET | Freq: Every day | ORAL | 6 refills | Status: DC

## 2022-08-17 MED ORDER — AMIODARONE HCL 200 MG PO TABS: 200.0000 mg | ORAL_TABLET | Freq: Every day | ORAL | 3 refills | Status: DC

## 2022-08-17 MED ORDER — PANTOPRAZOLE SODIUM 40 MG PO TBEC: 40.0000 mg | DELAYED_RELEASE_TABLET | Freq: Every day | ORAL | 3 refills | Status: DC

## 2022-08-17 MED ORDER — LEVOTHYROXINE SODIUM 25 MCG PO TABS: 25.0000 ug | ORAL_TABLET | Freq: Every day | ORAL | 3 refills | Status: AC

## 2022-08-17 MED ORDER — SILDENAFIL CITRATE 20 MG PO TABS: 20.0000 mg | ORAL_TABLET | Freq: Three times a day (TID) | ORAL | 6 refills | Status: DC

## 2022-08-17 MED ORDER — MAGNESIUM OXIDE -MG SUPPLEMENT 400 (240 MG) MG PO TABS: 400.0000 mg | ORAL_TABLET | Freq: Two times a day (BID) | ORAL | 3 refills | Status: DC

## 2022-08-17 MED ORDER — TORSEMIDE 20 MG PO TABS: ORAL_TABLET | ORAL | 3 refills | Status: DC

## 2022-08-17 NOTE — Progress Notes (Addendum)
Patient presents for 1 week follow up in [facility] today alone. Reports no problems with VAD equipment or concerns with drive line.  Pt arrived via wheelchair with his rollator. Able to walk unassisted to scale and get onto clinic room stretcher. Pt states he is feeling great, and feels he is getting stronger. Denies lightheadedness, dizziness, falls, and signs of bleeding. He states he occasionally gets short of breath with activity and is adjusting to the weight of the equipment. Encouraged to continue increasing activity as tolerated. Will refer to [facility] cardiac rehab once cleared from sternal precautions around 09/03/22. He verbalized understanding.   No nosebleeds noted. He is using PRN nasal spray prescribed by Dr Constance Holster.   Pt brought all medications with him today. Reports taking all medications as prescribed. Refills sent to [facility] in [place] for all medications except insulins per pt request. Reports he increased Torsemide to 40/20 for 2 days then resumed at 40mg  daily, and increased Potassium to 39meq daily. He also decreased Digoxin as instructed. He is taking a whole tablet every other day, as he did not have a pill cutter at [facility]. He plans to cut tablets in half now that he has returned to his home. Digoxin level checked today.   Pt reports itchy rash on his back, arms, and legs. States he was seen at his PCP office yesterday for diabetes management and forgot to mention rash. Rash appears to be patches of dried skin. Per Dr Haroldine Laws this appears to be eczema. Order received for Hydrocortisone 1% cream BID to affected areas. Prescription sent to pt's pharmacy. Advised pt he is not to put any cream near his drive line. He verbalized understanding.   Pt reports they are changing dressing twice a week on Monday/Thursday. Drive line dressing changed today in clinic. See documentation below. Remaining suture removed today. Instructed pt to increase frequency of dressing  changes if drainage increases with suture removal. He verbalized understanding. Spoke with [name] over the phone and made aware of the above. She verbalized understanding.   Vital Signs:  Doppler Pressure: 74 Automatc BP: 95/68 (78) HR: 85 SPO2: UTO % RA   Weight: 191.6 lb w/ eqt Last weight: 195.2 lb w/ ept BMI today 27 today  VAD Indication: Destination Therapy due to uncontrolled diabetes   LVAD assessment: HM III: VAD Speed: 5400 rpms Flow: 4.6 Power: 4.0 w    PI: 2.9 Hct: 29   Alarms: none Events: rare  Fixed speed: 5400 Low speed limit: 5100  Primary controller: back up battery due for replacement in  26 months Secondary controller:  back up battery due for replacement in 26  months    I reviewed the LVAD parameters from today and compared the results to the patient's prior recorded data. LVAD interrogation was NEGATIVE for significant power changes, NEGATIVE for clinical alarms and STABLE for PI events/speed drops. No programming changes were made and pump is functioning within specified parameters. Pt is performing daily controller and system monitor self tests along with completing weekly and monthly maintenance for LVAD equipment.   LVAD equipment check completed and is in good working order. Back-up equipment present. Charged back up battery and performed self-test on equipment.    Annual Equipment Maintenance on UBC/PM was performed on 06/2022.   Exit Site Care: VAD dressing and anchor removed and site care performed using sterile technique. Drive line exit site cleaned with Chlora prep applicators x 2, allowed to dry, skin protectant applied and allowed to  dry before gauze dressing and Aquacel silver strip re-applied. Exit site well healing, site partially incorporated.The velour is fully implanted at exit site. 1 black suture remaining- removed today. Scant amount of dried serous drainage on previous dressing/silver strip. No tenderness,or foul odor noted. Drive  line anchor re-applied. Pt denies fever or chills. Driveline dressing is being changed twice a week per sterile technique by caregiver [name]. Advised if increased drainage with suture removal will need to change dressing more frequently. He verbalized understanding. Patient given 7 daily kits and 6 anchors for at home use.     Significant Events on VAD Support:  none   Device: N/A    BP & Labs:  Doppler 74 - Doppler is reflecting MAP   Hgb 9.5 - No S/S of bleeding. Specifically denies melena/BRBPR or nosebleeds.   LDH stable at 235  -Denies tea-colored urine. No power elevations noted on interrogation.   Patient Instructions: No medication changes today May use Hydrocortisone 1% cream on rash on your back, legs, and arms. DO NOT apply near drive line.  Coumadin dosing per Lauren PharmD Return to clinic in 1 week for follow up with Dr Dwyane Dee RN [place] Coordinator  Office: [phone]  24/7 Pager: [phone]

## 2022-08-17 NOTE — Patient Instructions (Addendum)
No medication changes today May use Hydrocortisone 1% cream on rash on your back, legs, and arms. DO NOT apply near drive line.  Coumadin dosing per Ander Purpura PharmD Return to clinic in 1 week for follow up with Dr Aundra Dubin

## 2022-08-17 NOTE — Progress Notes (Addendum)
ADVANCED HF/VAD CLINIC NOTE   Mr Castiglia is a 61 y.o. with history of atrial fibrillation/flutter and nonischemic cardiomyopathy who was initially referred by Dr. Harl Bowie for evaluation of CHF.  Patient was diagnosed with CHF in 2020 at [facility] in [place].  At the time, he drank heavily and used cocaine.  He stopped drinking, smoking, and cocaine in 2020 and has not used any of these substances since then. Echo in 11/20 showed EF < 10%, cath in 12/20 showed normal coronaries.  Echo has shown persistently low EF over time, echo in 2/23 showed EF 20-25%.  He has a Medtronic ICD.      Patient was admitted in 2/23 with atrial flutter/RVR. He was started on amiodarone and converted to NSR.  He had atrial flutter ablation in 5/23 and amiodarone was stopped  Echo was done in 11/23 showing elevated filling pressure and markedly low cardiac output.  Patient was admitted, started on milrinone, and underwent HM3 LVAD placement with LAA clipping later in 11/23.  Post-op course complicated by acute hypoxemic respiratory failure post-op and PNA.  Patient went to CIR after acute hospital course.   Returns for 1 week f/u. Getting stronger every day. Able to do all ADLs. Denies SOB. Edema well controlled with torsemide 40 daily. Only complaint is itchy rash on his back. Denies orthopnea or PND. No fevers, chills or problems with driveline. No bleeding, melena or neuro symptoms. No VAD alarms. Taking all meds as prescribed.    Labs (12/23): K 4.3, creatinine 1.25, Na 128, hgb 8.8  PMH: 1. Atrial fibrillation/flutter: Mainly flutter.  - 5/23 Atrial flutter ablation.  2. H/o DVT 3. Chronic systolic CHF: Nonischemic cardiomyopathy.  Medtronic ICD.  - Echo (11/20): EF < 10% - Cath (12/20): Normal coronaries.  - Echo (8/22): EF 30%, mildly decreased RV systolic function.  - Echo (2/23): EF 20-25%, normal RV.  - RHC (11/23): mean RA 14, PA 66/53 mean 54, mean PCWP 43, CI 1.62 F/1.31 T - HM3 LVAD with LAA clipping  on 07/04/22.  4. Prior cocaine abuse.  5. Prior heavy ETOH   SH: Patient was a heavy drinker, abused cocaine, and smoked in the past but quit in 2020.  Lives in [place]. He does part-time custodial work.    FH: Mother with PPM, aunt with CHF.    VAD Indication: Destination Therapy due to uncontrolled diabetes   LVAD assessment: HM III: VAD Speed: 5400 rpms Flow: 4.6 Power: 4.0 w    PI: 2.9 Hct: 29    Alarms: none Events: rare  Fixed speed: 5400 Low speed limit: 5100   Primary controller: back up battery due for replacement in  26 months Secondary controller:  back up battery due for replacement in 26  months     I reviewed the LVAD parameters from today and compared the results to the patient's prior recorded data. LVAD interrogation was NEGATIVE for significant power changes, NEGATIVE for clinical alarms and STABLE for PI events/speed drops. No programming changes were made and pump is functioning within specified parameters. Pt is performing daily controller and system monitor self tests along with completing weekly and monthly maintenance for LVAD equipment.   LVAD equipment check completed and is in good working order. Back-up equipment present. Charged back up battery and performed self-test on equipment.    Annual Equipment Maintenance on UBC/PM was performed on 06/2022.      ROS: All systems negative except as listed in HPI, PMH and Problem List.  Current  Outpatient Medications  Medication Sig Dispense Refill   hydrocortisone cream 1 % Apply 1 Application topically 2 (two) times daily. Apply to affected areas on arms, back, and legs. DO NOT apply near driveline. 30 g 6   insulin glargine (SEMGLEE, YFGN,) 100 UNIT/ML Solostar Pen Inject 22 Units into the skin at bedtime. 15 mL 0   insulin glargine-yfgn (SEMGLEE) 100 UNIT/ML injection Inject 0.13 mLs (13 Units total) into the skin daily. 10 mL 11   insulin lispro (HUMALOG KWIKPEN) 100 UNIT/ML KwikPen Use 4 units  three times a day with meals 15 mL 0   Insulin Pen Needle (PEN NEEDLES) 32G X 4 MM MISC Use with insulin pen with breakfast, with lunch, and with evening meal. 100 each 0   acetaminophen (TYLENOL) 325 MG tablet Take 1-2 tablets (325-650 mg total) by mouth every 4 (four) hours as needed for mild pain. (Patient not taking: Reported on 08/17/2022)     amiodarone (PACERONE) 200 MG tablet Take 1 tablet (200 mg total) by mouth daily. 90 tablet 3   atorvastatin (LIPITOR) 20 MG tablet Take 1 tablet (20 mg total) by mouth daily. 90 tablet 3   Calcium Carb-Cholecalciferol 600-10 MG-MCG TABS Take 1 tablet by mouth daily with breakfast. 90 tablet 3   digoxin (LANOXIN) 0.125 MG tablet Take 0.5 tablets (0.0625 mg total) by mouth daily. 30 tablet 5   empagliflozin (JARDIANCE) 10 MG TABS tablet Take 1 tablet (10 mg total) by mouth daily. 30 tablet 6   Fe Fum-Vit C-Vit B12-FA (TRIGELS-F FORTE) CAPS capsule Take 1 capsule by mouth daily after breakfast. 30 capsule 6   levothyroxine (SYNTHROID) 25 MCG tablet Take 1 tablet (25 mcg total) by mouth daily before breakfast. 90 tablet 3   magnesium oxide (MAG-OX) 400 (240 Mg) MG tablet Take 1 tablet (400 mg total) by mouth 2 (two) times daily. 180 tablet 3   melatonin 3 MG TABS tablet Take 1 tablet (3 mg total) by mouth at bedtime. (Patient not taking: Reported on 08/07/2022)  0   Multiple Vitamin (MULTIVITAMIN WITH MINERALS) TABS tablet Take 1 tablet by mouth daily. (Patient not taking: Reported on 08/07/2022)     pantoprazole (PROTONIX) 40 MG tablet Take 1 tablet (40 mg total) by mouth daily. 90 tablet 3   polyethylene glycol (MIRALAX / GLYCOLAX) 17 g packet Take 17 g by mouth daily as needed for mild constipation. (Patient not taking: Reported on 08/07/2022) 14 each 0   potassium chloride SA (KLOR-CON M) 20 MEQ tablet Take 2 tablets (40 mEq total) by mouth daily. 180 tablet 3   sertraline (ZOLOFT) 50 MG tablet Take 1 tablet (50 mg total) by mouth daily. 90 tablet 3    sildenafil (REVATIO) 20 MG tablet Take 1 tablet (20 mg total) by mouth 3 (three) times daily. 90 tablet 6   sodium chloride (OCEAN) 0.65 % SOLN nasal spray Place 2 sprays into both nostrils 4 (four) times daily. (Patient not taking: Reported on 08/17/2022)  0   spironolactone (ALDACTONE) 25 MG tablet Take 1 tablet (25 mg total) by mouth daily. 90 tablet 3   torsemide (DEMADEX) 20 MG tablet 40 mg (2 tablets) daily, or as directed by the heart failure clinic 180 tablet 3   warfarin (COUMADIN) 3 MG tablet Take 1 tablet (3 mg total) by mouth daily at 4 PM. Take 1 tablet (3 mg) daily, or as directed by heart failure clinic 90 tablet 3   No current facility-administered medications for this encounter.  Patient has no known allergies.  REVIEW OF SYSTEMS: All systems negative except as listed in HPI, PMH and Problem list.   LVAD INTERROGATION:  See LVAD nurse's note above.   I reviewed the LVAD parameters from today, and compared the results to the patient's prior recorded data.  No programming changes were made.  The LVAD is functioning within specified parameters.  The patient performs LVAD self-test daily.  LVAD interrogation was negative for any significant power changes, alarms or PI events/speed drops.  LVAD equipment check completed and is in good working order.  Back-up equipment present.   LVAD education done on emergency procedures and precautions and reviewed exit site care.    Vitals:   08/17/22 1124 08/17/22 1125  BP: (!) 74/0 95/68  Pulse: 85   Weight: 86.9 kg (191 lb 9.6 oz)    Vital Signs:  Doppler Pressure: 74 Automatc BP: 95/68 (78) HR: 85 SPO2: UTO % RA   Weight: 191.6 lb w/ eqt Last weight: 195.2 lb w/ ept BMI today 27 today  Physical Exam: General:  NAD.  HEENT: normal  Neck: supple. JVP  7-8.  Carotids 2+ bilat; no bruits. No lymphadenopathy or thryomegaly appreciated. Cor: LVAD hum.  Lungs: Clear. Abdomen: obese soft, nontender, non-distended. No  hepatosplenomegaly. No bruits or masses. Good bowel sounds. Driveline site clean. Anchor in place.  Extremities: no cyanosis, clubbing, rash. Warm no edema  scaly rash on back Neuro: alert & oriented x 3. No focal deficits. Moves all 4 without problem    ASSESSMENT AND PLAN:   1. Chronic systolic CHF: Nonischemic cardiomyopathy, diagnosed 2020.  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  Cath in 12/20 with no significant coronary disease.  Medtronic ICD. Echo in 2/23 showed EF 20-25% with normal RV.  [place] in 11/13 showed markedly elevated filling pressures, primarily pulmonary venous hypertension, low cardiac output, and low PAPI. Echo in 11/23 with EF <20%, mod RV dysfunction.  He was admitted and started on milrinone with improvement in hemodynamics.  We proceeded with HM3 LVAD with LAA clip on 07/04/22. Volume status looks good - Continue torsemide 40 daily. Adjust as needed Check BMET today - Continue Jardiance 10 mg daily.  - Continue sildenafil 20 tid for RV.  - Continue spironolactone 25 mg daily.  - Continue digoxin 0.125 daily, check level today. May be able to stop in near future.  - Has referral to [facility] 2. Atrial fibrillation/atrial flutter: H/o flutter ablation.  Had AF/RVR post-LVAD, went back into NSR. Remains in NSR on monitor - Continue amiodarone 200 mg daily, check LFTs and TSH.  He will need regular eye exam.  Would like to stop amiodarone if no arrhythmias after a couple more months.  - Continue warfarin.  3. VAD management: s/p HM-3 VAD on 07/04/22. Ramp echo 07/12/22 RV mild to mod HK. Speed turned to 5400. MAP 77 today.  No alarms, rare PIs.  - LDH 235  - Now off ASA.  - Continue warfarin. INR 2.0 Discussed dosing with PharmD personally. - Will repeat ramp echo eventually.  - VAD interrogated  personally. Parameters stable. - DL site ok  - MAPs ok 4. H/o DVT: On anticoagulation.  5. Acute hypoxemic respiratory failure, post-op: Resolved.  On room air  Continue incentive spirometry at home.  6. CKD stage 3: On  Jardiance.  - BMET today.  7. Hypothyroidism - On levothyroxine.  8. Anemia: post-op.  - hgb improved 9.1 -> 9.5 9. Hyponatremia: Hypervolemic.  Continue to limit po fluid intake.  - BMET today  10. Truncal rash - looks like eczema - hydrocortisone 1% cream  Glori Bickers 08/17/2022

## 2022-08-18 LAB — T3, FREE: T3, Free: 1.9 pg/mL — ABNORMAL LOW (ref 2.0–4.4)

## 2022-08-20 ENCOUNTER — Encounter (HOSPITAL_COMMUNITY): Payer: Self-pay | Admitting: *Deleted

## 2022-08-20 ENCOUNTER — Other Ambulatory Visit: Payer: Self-pay

## 2022-08-20 ENCOUNTER — Emergency Department (HOSPITAL_COMMUNITY): Payer: Medicare Other

## 2022-08-20 ENCOUNTER — Emergency Department (HOSPITAL_COMMUNITY)
Admission: EM | Admit: 2022-08-20 | Discharge: 2022-08-20 | Disposition: A | Discharge status: Home / Self Care | Payer: Medicare Other | Attending: Emergency Medicine | Admitting: Emergency Medicine

## 2022-08-20 DIAGNOSIS — R42 Dizziness and giddiness: Secondary | ICD-10-CM | POA: Diagnosis present

## 2022-08-20 DIAGNOSIS — Z794 Long term (current) use of insulin: Secondary | ICD-10-CM | POA: Insufficient documentation

## 2022-08-20 DIAGNOSIS — Z7901 Long term (current) use of anticoagulants: Secondary | ICD-10-CM | POA: Insufficient documentation

## 2022-08-20 DIAGNOSIS — R000 Tachycardia, unspecified: Secondary | ICD-10-CM | POA: Insufficient documentation

## 2022-08-20 DIAGNOSIS — R55 Syncope and collapse: Secondary | ICD-10-CM | POA: Insufficient documentation

## 2022-08-20 LAB — COMPREHENSIVE METABOLIC PANEL
ALT: 25 U/L (ref 0–44)
AST: 19 U/L (ref 15–41)
Albumin: 3.1 g/dL — ABNORMAL LOW (ref 3.5–5.0)
Alkaline Phosphatase: 127 U/L — ABNORMAL HIGH (ref 38–126)
Anion gap: 12 (ref 5–15)
BUN: 24 mg/dL — ABNORMAL HIGH (ref 6–20)
CO2: 25 mmol/L (ref 22–32)
Calcium: 9.1 mg/dL (ref 8.9–10.3)
Chloride: 97 mmol/L — ABNORMAL LOW (ref 98–111)
Creatinine, Ser: 1.45 mg/dL — ABNORMAL HIGH (ref 0.61–1.24)
GFR, Estimated: 56 mL/min — ABNORMAL LOW (ref >60)
Glucose, Bld: 131 mg/dL — ABNORMAL HIGH (ref 70–99)
Potassium: 4.2 mmol/L (ref 3.5–5.1)
Sodium: 134 mmol/L — ABNORMAL LOW (ref 135–145)
Total Bilirubin: 0.5 mg/dL (ref 0.3–1.2)
Total Protein: 8.1 g/dL (ref 6.5–8.1)

## 2022-08-20 LAB — CBC
HCT: 32.3 % — ABNORMAL LOW (ref 39.0–52.0)
Hemoglobin: 9.6 g/dL — ABNORMAL LOW (ref 13.0–17.0)
MCH: 23.6 pg — ABNORMAL LOW (ref 26.0–34.0)
MCHC: 29.7 g/dL — ABNORMAL LOW (ref 30.0–36.0)
MCV: 79.6 fL — ABNORMAL LOW (ref 80.0–100.0)
Platelets: 553 10*3/uL — ABNORMAL HIGH (ref 150–400)
RBC: 4.06 MIL/uL — ABNORMAL LOW (ref 4.22–5.81)
RDW: 18.5 % — ABNORMAL HIGH (ref 11.5–15.5)
WBC: 9.2 10*3/uL (ref 4.0–10.5)
nRBC: 0 % (ref 0.0–0.2)

## 2022-08-20 LAB — TROPONIN I (HIGH SENSITIVITY): Troponin I (High Sensitivity): 45 ng/L — ABNORMAL HIGH (ref <18)

## 2022-08-20 LAB — PROTIME-INR
INR: 2 — ABNORMAL HIGH (ref 0.8–1.2)
Prothrombin Time: 22.4 seconds — ABNORMAL HIGH (ref 11.4–15.2)

## 2022-08-20 LAB — LACTATE DEHYDROGENASE: LDH: 211 U/L — ABNORMAL HIGH (ref 98–192)

## 2022-08-20 MED ORDER — SODIUM CHLORIDE 0.9 % IV BOLUS: 500.0000 mL | Freq: Once | INTRAVENOUS | Status: DC

## 2022-08-20 NOTE — ED Provider Triage Note (Signed)
Emergency Medicine Provider Triage Evaluation Note  Darryl Frye , a 61 y.o. male  was evaluated in triage.  Pt complains of shortness of breath the past few days and lightheadedness sensation today.  Patient reports that he got all hot and sweaty and felt he was going to pass out today.  He reports now he feels fine but wanted to be checked out.  He mentioned he was having some lower back pain today but is since resolved and he feels fine.  No urinary symptoms.  No saddle anesthesia.  No fecal or urinary incontinence.  No fevers.  Review of Systems  Positive:  Negative:   Physical Exam  BP (!) 87/76   Pulse 87   Temp 98.3 F (36.8 C)   Resp 20   Ht 5\' 11"  (1.803 m)   Wt 86.9 kg   SpO2 96%   BMI 26.72 kg/m  Gen:   Awake, no distress   Resp:  Normal effort  MSK:   Moves extremities without difficulty  Other:  Electronic heart sounds  Medical Decision Making  Medically screening exam initiated at 5:55 PM.  Appropriate orders placed.  Darryl Frye was informed that the remainder of the evaluation will be completed by another provider, this initial triage assessment does not replace that evaluation, and the importance of remaining in the ED until their evaluation is complete.  LVAD Coordinator - Ebony Hail who added labs. She would like to be paged once labs have been completed. BP at his baseline. She reports that he was borderline dry at their last visit.    Sherrell Puller, PA-C 08/20/22 1758

## 2022-08-20 NOTE — ED Triage Notes (Signed)
The pt arrived by gems ambulatory from the ems unit.  The pt reports that he has been sob for awhile    lower back pain just started today while sitting

## 2022-08-20 NOTE — ED Triage Notes (Signed)
The pt is no longer having back pain he reports that it has stopped now

## 2022-08-20 NOTE — ED Provider Notes (Signed)
[place] [facility] EMERGENCY DEPARTMENT Provider Note   CSN: [id number] Arrival date & time: 08/20/22  1737     History  Chief Complaint  Patient presents with   Back Pain   Near Syncope    Darryl Frye is a 61 y.o. male.  Patient with history of heart failure with LVAD was having some shortness of breath the last few days with some lightheadedness today.  He was feeling hot and sweaty and felt like he was in a pass out but did not.  He feels a lot better now.  He is also got some low back pain that has also resolved.  He has no urinary symptoms.  Denies any trauma.  He has no loss of bowel or bladder.  Overall he is asymptomatic.  Denies any active symptoms.  The history is provided by the patient.       Home Medications Prior to Admission medications   Medication Sig Start Date End Date Taking? Authorizing Provider  acetaminophen (TYLENOL) 325 MG tablet Take 1-2 tablets (325-650 mg total) by mouth every 4 (four) hours as needed for mild pain. Patient not taking: Reported on 08/17/2022 07/31/22   Barbie Banner, PA-C  amiodarone (PACERONE) 200 MG tablet Take 1 tablet (200 mg total) by mouth daily. 08/17/22   Larey Dresser, MD  atorvastatin (LIPITOR) 20 MG tablet Take 1 tablet (20 mg total) by mouth daily. 08/17/22   Larey Dresser, MD  Calcium Carb-Cholecalciferol 600-10 MG-MCG TABS Take 1 tablet by mouth daily with breakfast. 08/17/22   Larey Dresser, MD  digoxin (LANOXIN) 0.125 MG tablet Take 0.5 tablets (0.0625 mg total) by mouth daily. 08/17/22   Larey Dresser, MD  empagliflozin (JARDIANCE) 10 MG TABS tablet Take 1 tablet (10 mg total) by mouth daily. 08/17/22   Larey Dresser, MD  Fe Fum-Vit C-Vit B12-FA (TRIGELS-F FORTE) CAPS capsule Take 1 capsule by mouth daily after breakfast. 08/17/22   Larey Dresser, MD  hydrocortisone cream 1 % Apply 1 Application topically 2 (two) times daily. Apply to affected areas on arms, back, and legs. DO NOT  apply near driveline. 08/17/22   Bensimhon, Shaune Pascal, MD  insulin glargine (SEMGLEE, YFGN,) 100 UNIT/ML Solostar Pen Inject 22 Units into the skin at bedtime. 07/31/22   Setzer, Edman Circle, PA-C  insulin glargine-yfgn (SEMGLEE) 100 UNIT/ML injection Inject 0.13 mLs (13 Units total) into the skin daily. 08/01/22   Setzer, Edman Circle, PA-C  insulin lispro (HUMALOG KWIKPEN) 100 UNIT/ML KwikPen Use 4 units three times a day with meals 07/31/22   Setzer, Edman Circle, PA-C  Insulin Pen Needle (PEN NEEDLES) 32G X 4 MM MISC Use with insulin pen with breakfast, with lunch, and with evening meal. 07/31/22   Setzer, Edman Circle, PA-C  levothyroxine (SYNTHROID) 25 MCG tablet Take 1 tablet (25 mcg total) by mouth daily before breakfast. 08/17/22   Larey Dresser, MD  magnesium oxide (MAG-OX) 400 (240 Mg) MG tablet Take 1 tablet (400 mg total) by mouth 2 (two) times daily. 08/17/22   Larey Dresser, MD  melatonin 3 MG TABS tablet Take 1 tablet (3 mg total) by mouth at bedtime. Patient not taking: Reported on 08/07/2022 07/31/22   Barbie Banner, PA-C  Multiple Vitamin (MULTIVITAMIN WITH MINERALS) TABS tablet Take 1 tablet by mouth daily. Patient not taking: Reported on 08/07/2022 07/31/22   Barbie Banner, PA-C  pantoprazole (PROTONIX) 40 MG tablet Take 1 tablet (40 mg total) by  mouth daily. 08/17/22   Larey Dresser, MD  polyethylene glycol (MIRALAX / GLYCOLAX) 17 g packet Take 17 g by mouth daily as needed for mild constipation. Patient not taking: Reported on 08/07/2022 07/31/22   Barbie Banner, PA-C  potassium chloride SA (KLOR-CON M) 20 MEQ tablet Take 2 tablets (40 mEq total) by mouth daily. 08/17/22   Larey Dresser, MD  sertraline (ZOLOFT) 50 MG tablet Take 1 tablet (50 mg total) by mouth daily. 08/17/22   Larey Dresser, MD  sildenafil (REVATIO) 20 MG tablet Take 1 tablet (20 mg total) by mouth 3 (three) times daily. 08/17/22   Larey Dresser, MD  sodium chloride (OCEAN) 0.65 % SOLN nasal spray  Place 2 sprays into both nostrils 4 (four) times daily. Patient not taking: Reported on 08/17/2022 07/31/22   Barbie Banner, PA-C  spironolactone (ALDACTONE) 25 MG tablet Take 1 tablet (25 mg total) by mouth daily. 08/17/22   Larey Dresser, MD  torsemide (DEMADEX) 20 MG tablet 40 mg (2 tablets) daily, or as directed by the heart failure clinic 08/17/22   Larey Dresser, MD  warfarin (COUMADIN) 3 MG tablet Take 1 tablet (3 mg total) by mouth daily at 4 PM. Take 1 tablet (3 mg) daily, or as directed by heart failure clinic 08/17/22   Larey Dresser, MD      Allergies    Patient has no known allergies.    Review of Systems   Review of Systems  Physical Exam Updated Vital Signs BP 97/84   Pulse 89   Temp 98.3 F (36.8 C)   Resp 16   Ht 5\' 11"  (1.803 m)   Wt 86.9 kg   SpO2 97%   BMI 26.72 kg/m  Physical Exam Vitals and nursing note reviewed.  Constitutional:      General: He is not in acute distress.    Appearance: He is well-developed. He is not ill-appearing.  HENT:     Head: Normocephalic and atraumatic.     Nose: Nose normal.     Mouth/Throat:     Mouth: Mucous membranes are moist.  Eyes:     Extraocular Movements: Extraocular movements intact.     Conjunctiva/sclera: Conjunctivae normal.     Pupils: Pupils are equal, round, and reactive to light.  Cardiovascular:     Comments: LVAD hum heard Pulmonary:     Effort: Pulmonary effort is normal. No respiratory distress.     Breath sounds: Normal breath sounds.  Abdominal:     Palpations: Abdomen is soft.     Tenderness: There is no abdominal tenderness.  Musculoskeletal:        General: No swelling.     Cervical back: Neck supple.  Skin:    General: Skin is warm and dry.     Capillary Refill: Capillary refill takes less than 2 seconds.  Neurological:     General: No focal deficit present.     Mental Status: He is alert and oriented to person, place, and time.     Cranial Nerves: No cranial nerve deficit.      Sensory: No sensory deficit.     Motor: No weakness.     Coordination: Coordination normal.  Psychiatric:        Mood and Affect: Mood normal.     ED Results / Procedures / Treatments   Labs (all labs ordered are listed, but only abnormal results are displayed) Labs Reviewed  CBC - Abnormal; Notable for the  following components:      Result Value   RBC 4.06 (*)    Hemoglobin 9.6 (*)    HCT 32.3 (*)    MCV 79.6 (*)    MCH 23.6 (*)    MCHC 29.7 (*)    RDW 18.5 (*)    Platelets 553 (*)    All other components within normal limits  COMPREHENSIVE METABOLIC PANEL - Abnormal; Notable for the following components:   Sodium 134 (*)    Chloride 97 (*)    Glucose, Bld 131 (*)    BUN 24 (*)    Creatinine, Ser 1.45 (*)    Albumin 3.1 (*)    Alkaline Phosphatase 127 (*)    GFR, Estimated 56 (*)    All other components within normal limits  PROTIME-INR - Abnormal; Notable for the following components:   Prothrombin Time 22.4 (*)    INR 2.0 (*)    All other components within normal limits  LACTATE DEHYDROGENASE - Abnormal; Notable for the following components:   LDH 211 (*)    All other components within normal limits  TROPONIN I (HIGH SENSITIVITY) - Abnormal; Notable for the following components:   Troponin I (High Sensitivity) 45 (*)    All other components within normal limits    EKG EKG Interpretation  Date/Time:  Sunday August 20 2022 18:02:18 EST Ventricular Rate:  101 PR Interval:  584 QRS Duration: 148 QT Interval:  682 QTC Calculation: 884 R Axis:   208 Text Interpretation: Sinus tachycardia with 1st degree A-V block with occasional and consecutive Premature ventricular complexes Non-specific intra-ventricular conduction block Possible Right ventricular hypertrophy Lateral infarct , age undetermined Inferior infarct , age undetermined Abnormal ECG When compared with ECG of 04-Jul-2022 14:15, PREVIOUS ECG IS PRESENT Confirmed by Lennice Sites [id number]) on 08/20/2022  6:24:13 PM  Radiology DG Chest 2 View  Result Date: 08/20/2022 CLINICAL DATA:  Shortness of breath EXAM: CHEST - 2 VIEW COMPARISON:  AP chest 07/17/2022 FINDINGS: Redemonstration of LVAD, unchanged in position. Left chest wall cardiac AICD with leads overlying the right atrium and right ventricle. Status post median sternotomy. Cardiac silhouette is again mildly enlarged. Mediastinal contours are within normal limits. No significant change in left mid and lower lung horizontal linear opacities. Improved aeration of the right lower lung with minimal interstitial thickening. Small bilateral pleural effusions. No pneumothorax. Mild-to-moderate multilevel degenerative disc changes of the thoracic spine. IMPRESSION: 1. Improved aeration of the right lower lung without significant acute opacity. 2. Unchanged left mid and lower lung predominantly linear opacities, possible combination of atelectasis and scarring. It is difficult to exclude underlying infection, however overall the bilateral lung aeration is improved from most recent 07/17/2022 radiographs. 3. Small bilateral pleural effusions. Electronically Signed   By: Yvonne Kendall M.D.   On: 08/20/2022 18:32    Procedures Procedures    Medications Ordered in ED Medications - No data to display  ED Course/ Medical Decision Making/ A&P                           Medical Decision Making  Jimmy Plessinger Arment is here with episode of near syncope.  Resolved.  Patient has history of heart failure with LVAD.  LVAD coordinator at the bedside upon my evaluation.  LVAD appears to be functioning well.  He is not having any symptoms.  No chest pain or shortness of breath.  Discussed some chronic back pain at times but none  currently.  Denies any trauma.  Denies any nausea vomiting diarrhea.  Blood work was drawn including CBC and CMP and INR troponin and overall were unremarkable and fairly at baseline.  We offered patient small IV fluid bolus but he declined.  He  states that he feels fine and just rather go home.  Understands return precautions.  Discharged in good condition.  Overall no concern for LVAD dysfunction or acute cardiac or pulmonary process.  Appeared very well.  This chart was dictated using voice recognition software.  Despite best efforts to proofread,  errors can occur which can change the documentation meaning.         Final Clinical Impression(s) / ED Diagnoses Final diagnoses:  Near syncope    Rx / DC Orders ED Discharge Orders     None         Lennice Sites, DO 08/20/22 1949

## 2022-08-20 NOTE — Progress Notes (Addendum)
LVAD Coordinator ED Encounter  Darryl Frye a 61 y.o. male that presented to [facility] ER today due to dizziness, shortness of breath, and lower back pain. He has a past medical history  has a past medical history of Acute kidney injury ([place]), Acute respiratory failure ([place]), Atrial flutter ([place]), CHF (congestive heart failure) ([place]), Coronary artery disease, Diabetes mellitus without complication ([place]), DVT (deep venous thrombosis) ([place]) (06/2019), Heart murmur, Hypertension, Presence of permanent cardiac pacemaker, and Renal disorder.Marland Kitchen   LVAD is a HM 3 and was implanted on 07/04/22 by Dr Tenny Craw for destination therapy.   Received page from pt at 1715 reporting earlier today he had an episode of dizziness where he felt like he was going to pass out. This resolved with rest. This afternoon had an episode of severe lower back pain with shortness of breath and a hot flash. Back pain and hot flash have resolved. Reports continued shortness of breath. Pt had already called 911 prior to notifying VAD coordinator and is on his way to [facility] ER.   Spoke with [place] PA in the ER regarding the above. Plan for labs, EKG, and chest xray. Made aware that if pt requires admission he will need to be admitted by Dr Aundra Dubin. Pt disposition to be discussed with Dr Aundra Dubin once results are back.   Hgb 9.6. INR 2.0. Creatinine 1.45 LDH 211.   Chest xray:  1. Improved aeration of the right lower lung without significant acute opacity. 2. Unchanged left mid and lower lung predominantly linear opacities, possible combination of atelectasis and scarring. It is difficult to exclude underlying infection, however overall the bilateral lung aeration is improved from most recent 07/17/2022 radiographs. 3. Small bilateral pleural effusions.  Discussed the above with Dr Aundra Dubin and Dr Ronnald Nian. Will plan to give 500 cc NS for dizziness episode earlier today and Cr 1.45. Will check respiratory panel. If pt remains stable may discharge  home.   Addendum: pt refused IV fluid and asked to be discharged home. Respiratory panel was not obtained.   Vital signs: Temp: 98.3  HR: 87 Doppler MAP:  Automated BP: 87/76 (80) O2 Sat: 96% on RA  LVAD interrogation reveals:  Speed: 5400 Flow: 4.7 Power:  4.0 PI: 2.9  Alarms: none Events: 5-10 PI events daily  Drive Line: Twice weekly using daily kit by pt's caregiver.   Significant Events with LVAD:   Updated VAD Providers (Dr Aundra Dubin) about the above. No LVAD issues and pump is functioning as expected. Able to independently manage LVAD equipment. No LVAD needs at this time.    Emerson Monte RN [place] Coordinator  Office: [phone]  24/7 Pager: [phone]

## 2022-08-23 ENCOUNTER — Other Ambulatory Visit (HOSPITAL_COMMUNITY): Payer: Self-pay | Admitting: Unknown Physician Specialty

## 2022-08-23 DIAGNOSIS — Z95811 Presence of heart assist device: Secondary | ICD-10-CM

## 2022-08-23 DIAGNOSIS — Z7901 Long term (current) use of anticoagulants: Secondary | ICD-10-CM

## 2022-08-24 ENCOUNTER — Ambulatory Visit (HOSPITAL_COMMUNITY): Payer: Self-pay | Admitting: Pharmacist

## 2022-08-24 ENCOUNTER — Encounter (HOSPITAL_COMMUNITY): Payer: Medicaid Other

## 2022-08-24 ENCOUNTER — Ambulatory Visit (HOSPITAL_COMMUNITY)
Admission: RE | Admit: 2022-08-24 | Discharge: 2022-08-24 | Disposition: A | Discharge status: Home / Self Care | Payer: 59 | Source: Ambulatory Visit | Attending: Cardiology | Admitting: Cardiology

## 2022-08-24 VITALS — BP 86/0 | HR 81 | Ht 71.0 in | Wt 196.6 lb

## 2022-08-24 DIAGNOSIS — Z79890 Hormone replacement therapy (postmenopausal): Secondary | ICD-10-CM | POA: Insufficient documentation

## 2022-08-24 DIAGNOSIS — Z86718 Personal history of other venous thrombosis and embolism: Secondary | ICD-10-CM | POA: Diagnosis not present

## 2022-08-24 DIAGNOSIS — E1122 Type 2 diabetes mellitus with diabetic chronic kidney disease: Secondary | ICD-10-CM | POA: Diagnosis not present

## 2022-08-24 DIAGNOSIS — I509 Heart failure, unspecified: Secondary | ICD-10-CM

## 2022-08-24 DIAGNOSIS — Z7901 Long term (current) use of anticoagulants: Secondary | ICD-10-CM | POA: Diagnosis not present

## 2022-08-24 DIAGNOSIS — Z95811 Presence of heart assist device: Secondary | ICD-10-CM | POA: Diagnosis not present

## 2022-08-24 DIAGNOSIS — E871 Hypo-osmolality and hyponatremia: Secondary | ICD-10-CM | POA: Diagnosis not present

## 2022-08-24 DIAGNOSIS — Z87891 Personal history of nicotine dependence: Secondary | ICD-10-CM | POA: Diagnosis not present

## 2022-08-24 DIAGNOSIS — I480 Paroxysmal atrial fibrillation: Secondary | ICD-10-CM

## 2022-08-24 DIAGNOSIS — Z7984 Long term (current) use of oral hypoglycemic drugs: Secondary | ICD-10-CM | POA: Insufficient documentation

## 2022-08-24 DIAGNOSIS — D6489 Other specified anemias: Secondary | ICD-10-CM | POA: Insufficient documentation

## 2022-08-24 DIAGNOSIS — Z79899 Other long term (current) drug therapy: Secondary | ICD-10-CM | POA: Insufficient documentation

## 2022-08-24 DIAGNOSIS — I428 Other cardiomyopathies: Secondary | ICD-10-CM | POA: Insufficient documentation

## 2022-08-24 DIAGNOSIS — Z48812 Encounter for surgical aftercare following surgery on the circulatory system: Secondary | ICD-10-CM | POA: Diagnosis present

## 2022-08-24 DIAGNOSIS — I4892 Unspecified atrial flutter: Secondary | ICD-10-CM | POA: Insufficient documentation

## 2022-08-24 DIAGNOSIS — N183 Chronic kidney disease, stage 3 unspecified: Secondary | ICD-10-CM | POA: Insufficient documentation

## 2022-08-24 DIAGNOSIS — I4891 Unspecified atrial fibrillation: Secondary | ICD-10-CM | POA: Insufficient documentation

## 2022-08-24 DIAGNOSIS — E039 Hypothyroidism, unspecified: Secondary | ICD-10-CM | POA: Insufficient documentation

## 2022-08-24 DIAGNOSIS — I5022 Chronic systolic (congestive) heart failure: Secondary | ICD-10-CM | POA: Diagnosis not present

## 2022-08-24 DIAGNOSIS — Z794 Long term (current) use of insulin: Secondary | ICD-10-CM | POA: Diagnosis not present

## 2022-08-24 LAB — BASIC METABOLIC PANEL
Anion gap: 11 (ref 5–15)
BUN: 26 mg/dL — ABNORMAL HIGH (ref 6–20)
CO2: 25 mmol/L (ref 22–32)
Calcium: 9 mg/dL (ref 8.9–10.3)
Chloride: 97 mmol/L — ABNORMAL LOW (ref 98–111)
Creatinine, Ser: 1.32 mg/dL — ABNORMAL HIGH (ref 0.61–1.24)
GFR, Estimated: >60 mL/min (ref >60)
Glucose, Bld: 131 mg/dL — ABNORMAL HIGH (ref 70–99)
Potassium: 3.9 mmol/L (ref 3.5–5.1)
Sodium: 133 mmol/L — ABNORMAL LOW (ref 135–145)

## 2022-08-24 LAB — PROTIME-INR
INR: 2.4 — ABNORMAL HIGH (ref 0.8–1.2)
Prothrombin Time: 25.7 seconds — ABNORMAL HIGH (ref 11.4–15.2)

## 2022-08-24 LAB — CBC
HCT: 30.4 % — ABNORMAL LOW (ref 39.0–52.0)
Hemoglobin: 9.1 g/dL — ABNORMAL LOW (ref 13.0–17.0)
MCH: 23.7 pg — ABNORMAL LOW (ref 26.0–34.0)
MCHC: 29.9 g/dL — ABNORMAL LOW (ref 30.0–36.0)
MCV: 79.2 fL — ABNORMAL LOW (ref 80.0–100.0)
Platelets: 565 10*3/uL — ABNORMAL HIGH (ref 150–400)
RBC: 3.84 MIL/uL — ABNORMAL LOW (ref 4.22–5.81)
RDW: 18.8 % — ABNORMAL HIGH (ref 11.5–15.5)
WBC: 8.3 10*3/uL (ref 4.0–10.5)
nRBC: 0 % (ref 0.0–0.2)

## 2022-08-24 LAB — LACTATE DEHYDROGENASE: LDH: 226 U/L — ABNORMAL HIGH (ref 98–192)

## 2022-08-24 MED ORDER — AMIODARONE HCL 200 MG PO TABS: 100.0000 mg | ORAL_TABLET | Freq: Every day | ORAL | 3 refills | Status: DC

## 2022-08-24 MED ORDER — TORSEMIDE 20 MG PO TABS: 60.0000 mg | ORAL_TABLET | Freq: Every day | ORAL | 3 refills | Status: DC

## 2022-08-24 NOTE — Progress Notes (Addendum)
Patient presents for 1 week follow up in [facility] today alone. Reports no problems with VAD equipment or concerns with drive line.  Pt arrived with his rollator. Able to walk unassisted to scale and get onto clinic room stretcher. Pt states he is feeling very SOB w/walking short distances. Pts weight is up 5 lbs today. We will obtain a chest xray today per DR Joniah Bednarski.  Pt tells me that he went to the ED last week for lightheadedness, back pain and sweating. This resolved.   Pt denies falls and signs of bleeding. Pt starts CR at [facility] tomorrow.  No nosebleeds noted. He is using PRN nasal spray prescribed by Dr Constance Holster.   Pt brought all medications with him today. Reports taking all medications as prescribed.   Pt reports itchy rash on his back, arms, and legs. States the hydrocortisone cream prescribed has not helped.  Advised pt he is not to put any cream near his drive line. He verbalized understanding.   Pt reports they are changing dressing twice a week on Monday/Thursday. Drive line dressing changed today in clinic. See documentation below.   Vital Signs:  Doppler Pressure: 86 Automatc BP: 89/80 (85) HR: 81 SPO2: 97 % RA   Weight: 196.6 lb w/ eqt Last weight: 191.6 lb w/ ept BMI today 27 today  VAD Indication: Destination Therapy due to uncontrolled diabetes   LVAD assessment: HM III: VAD Speed: 5400 rpms Flow: 4.7 Power: 4.1 w    PI: 3.1 Hct: 32   Alarms: none Events: rare  Fixed speed: 5400 Low speed limit: 5100  Primary controller: back up battery due for replacement in  25 months Secondary controller:  back up battery due for replacement in 25  months    I reviewed the LVAD parameters from today and compared the results to the patient's prior recorded data. LVAD interrogation was NEGATIVE for significant power changes, NEGATIVE for clinical alarms and STABLE for PI events/speed drops. No programming changes were made and pump is functioning within specified  parameters. Pt is performing daily controller and system monitor self tests along with completing weekly and monthly maintenance for LVAD equipment.   LVAD equipment check completed and is in good working order. Back-up equipment present. Charged back up battery and performed self-test on equipment.    Annual Equipment Maintenance on UBC/PM was performed on 06/2022.   Exit Site Care: VAD dressing and anchor removed and site care performed using sterile technique. Drive line exit site cleaned with Chlora prep applicators x 2, allowed to dry, skin protectant applied and allowed to dry before gauze dressing and re-applied without silver strip. Gauze covered with 2 large tegaderms. Exit site well healing, site partially incorporated.The velour is fully implanted at exit site.  Scant amount of dried serous drainage. No tenderness,or foul odor noted. Drive line anchor re-applied. Pt denies fever or chills. Driveline dressing is being changed twice a week per sterile technique by caregiver [name]. Advance dressing to weekly using the daily kit without silver strip. He verbalized understanding. Patient states he has plenty of dressing kits at home.         Significant Events on VAD Support:  none   Device: N/A    BP & Labs:  Doppler 86 - Doppler is reflecting MAP   Hgb 9.1 - No S/S of bleeding. Specifically denies melena/BRBPR or nosebleeds.   LDH stable at 226  -Denies tea-colored urine. No power elevations noted on interrogation.   Patient Instructions: Increase Torsemide to  60 mg daily - 3 tablets Decrease Amiodarone to 100 mg daily  - 1/2 pill We will obtain a chest xray today Coumadin dosing per Lauren PharmD Return to clinic in 2 weeks for follow up with Dr Harvie Heck RN [place] Coordinator  Office: [phone]  24/7 Pager: [phone]     ADVANCED HF/VAD CLINIC NOTE   Darryl Frye is a 62 y.o. with history of atrial fibrillation/flutter and nonischemic cardiomyopathy  who was initially referred by Dr. Harl Bowie for evaluation of CHF.  Patient was diagnosed with CHF in 2020 at [facility] in [place].  At the time, he drank heavily and used cocaine.  He stopped drinking, smoking, and cocaine in 2020 and has not used any of these substances since then. Echo in 11/20 showed EF < 10%, cath in 12/20 showed normal coronaries.  Echo has shown persistently low EF over time, echo in 2/23 showed EF 20-25%.  He has a Medtronic ICD.      Patient was admitted in 2/23 with atrial flutter/RVR. He was started on amiodarone and converted to NSR.  He had atrial flutter ablation in 5/23 and amiodarone was stopped  Echo was done in 11/23 showing elevated filling pressure and markedly low cardiac output.  Patient was admitted, started on milrinone, and underwent HM3 LVAD placement with LAA clipping later in 11/23.  Post-op course complicated by acute hypoxemic respiratory failure post-op and PNA.  Patient went to [facility] after acute hospital course.   Returns for LVAD followup. Weight is up about 5 lbs today.  He is still short of breath at times, this waxes and wanes.  Mild dyspnea walking into the office. Sometimes gets short of breath walking around his house.  No lightheadedness.  MAP 86.  LVAD parameters stable, no PI events or alarms.   Labs (12/23): K 4.3, creatinine 1.25 => 1.45, Na 128, hgb 8.8 => 9.6, LDH 211, LFTs normal, TSH normal, digoxin 0.8  PMH: 1. Atrial fibrillation/flutter: Mainly flutter.  - 5/23 Atrial flutter ablation.  2. H/o DVT 3. Chronic systolic CHF: Nonischemic cardiomyopathy.  Medtronic ICD.  - Echo (11/20): EF < 10% - Cath (12/20): Normal coronaries.  - Echo (8/22): EF 30%, mildly decreased RV systolic function.  - Echo (2/23): EF 20-25%, normal RV.  - RHC (11/23): mean RA 14, PA 66/53 mean 54, mean PCWP 43, CI 1.62 F/1.31 T - HM3 LVAD with LAA clipping on 07/04/22.  4. Prior cocaine abuse.  5. Prior heavy ETOH   SH: Patient was a heavy drinker, abused  cocaine, and smoked in the past but quit in 2020.  Lives in [place]. He does part-time custodial work.    FH: Mother with PPM, aunt with CHF.    VAD Indication: Destination Therapy due to uncontrolled diabetes   LVAD assessment: HM III: See LVAD nurse's note above.    I reviewed the LVAD parameters from today and compared the results to the patient's prior recorded data. LVAD interrogation was NEGATIVE for significant power changes, NEGATIVE for clinical alarms and STABLE for PI events/speed drops. No programming changes were made and pump is functioning within specified parameters. Pt is performing daily controller and system monitor self tests along with completing weekly and monthly maintenance for LVAD equipment.   LVAD equipment check completed and is in good working order. Back-up equipment present. Charged back up battery and performed self-test on equipment.    Annual Equipment Maintenance on UBC/PM was performed on 06/2022.      ROS:  All systems negative except as listed in HPI, PMH and Problem List.  Current Outpatient Medications  Medication Sig Dispense Refill   acetaminophen (TYLENOL) 325 MG tablet Take 1-2 tablets (325-650 mg total) by mouth every 4 (four) hours as needed for mild pain.     atorvastatin (LIPITOR) 20 MG tablet Take 1 tablet (20 mg total) by mouth daily. 90 tablet 3   Calcium Carb-Cholecalciferol 600-10 MG-MCG TABS Take 1 tablet by mouth daily with breakfast. 90 tablet 3   digoxin (LANOXIN) 0.125 MG tablet Take 0.5 tablets (0.0625 mg total) by mouth daily. 30 tablet 5   empagliflozin (JARDIANCE) 10 MG TABS tablet Take 1 tablet (10 mg total) by mouth daily. 30 tablet 6   Fe Fum-Vit C-Vit B12-FA (TRIGELS-F FORTE) CAPS capsule Take 1 capsule by mouth daily after breakfast. 30 capsule 6   hydrocortisone cream 1 % Apply 1 Application topically 2 (two) times daily. Apply to affected areas on arms, back, and legs. DO NOT apply near driveline. 30 g 6   insulin  glargine (SEMGLEE, YFGN,) 100 UNIT/ML Solostar Pen Inject 22 Units into the skin at bedtime. 15 mL 0   insulin glargine-yfgn (SEMGLEE) 100 UNIT/ML injection Inject 0.13 mLs (13 Units total) into the skin daily. 10 mL 11   insulin lispro (HUMALOG KWIKPEN) 100 UNIT/ML KwikPen Use 4 units three times a day with meals 15 mL 0   Insulin Pen Needle (PEN NEEDLES) 32G X 4 MM MISC Use with insulin pen with breakfast, with lunch, and with evening meal. 100 each 0   levothyroxine (SYNTHROID) 25 MCG tablet Take 1 tablet (25 mcg total) by mouth daily before breakfast. 90 tablet 3   magnesium oxide (MAG-OX) 400 (240 Mg) MG tablet Take 1 tablet (400 mg total) by mouth 2 (two) times daily. 180 tablet 3   pantoprazole (PROTONIX) 40 MG tablet Take 1 tablet (40 mg total) by mouth daily. 90 tablet 3   potassium chloride SA (KLOR-CON M) 20 MEQ tablet Take 2 tablets (40 mEq total) by mouth daily. 180 tablet 3   sertraline (ZOLOFT) 50 MG tablet Take 1 tablet (50 mg total) by mouth daily. 90 tablet 3   sildenafil (REVATIO) 20 MG tablet Take 1 tablet (20 mg total) by mouth 3 (three) times daily. 90 tablet 6   sodium chloride (OCEAN) 0.65 % SOLN nasal spray Place 2 sprays into both nostrils 4 (four) times daily.  0   spironolactone (ALDACTONE) 25 MG tablet Take 1 tablet (25 mg total) by mouth daily. 90 tablet 3   warfarin (COUMADIN) 3 MG tablet Take 1 tablet (3 mg total) by mouth daily at 4 PM. Take 1 tablet (3 mg) daily, or as directed by heart failure clinic 90 tablet 3   amiodarone (PACERONE) 200 MG tablet Take 0.5 tablets (100 mg total) by mouth daily. 90 tablet 3   melatonin 3 MG TABS tablet Take 1 tablet (3 mg total) by mouth at bedtime. (Patient not taking: Reported on 08/24/2022)  0   Multiple Vitamin (MULTIVITAMIN WITH MINERALS) TABS tablet Take 1 tablet by mouth daily. (Patient not taking: Reported on 08/07/2022)     polyethylene glycol (MIRALAX / GLYCOLAX) 17 g packet Take 17 g by mouth daily as needed for mild  constipation. (Patient not taking: Reported on 08/07/2022) 14 each 0   torsemide (DEMADEX) 20 MG tablet Take 3 tablets (60 mg total) by mouth daily. 40 mg (2 tablets) daily, or as directed by the heart failure clinic 180 tablet 3  No current facility-administered medications for this encounter.    Patient has no known allergies.  REVIEW OF SYSTEMS: All systems negative except as listed in HPI, PMH and Problem list.   LVAD INTERROGATION:  See LVAD nurse's note above.   I reviewed the LVAD parameters from today, and compared the results to the patient's prior recorded data.  No programming changes were made.  The LVAD is functioning within specified parameters.  The patient performs LVAD self-test daily.  LVAD interrogation was negative for any significant power changes, alarms or PI events/speed drops.  LVAD equipment check completed and is in good working order.  Back-up equipment present.   LVAD education done on emergency procedures and precautions and reviewed exit site care.    Vitals:   08/24/22 1341 08/24/22 1342  BP: (!) 89/80 (!) 86/0  Pulse: 81   SpO2: 97%   Weight: 89.2 kg (196 lb 9.6 oz)   Height: _0  (1.803 m)    Vital Signs:  BP (!) 86/0   Pulse 81   Ht _1  (1.803 m)   Wt 89.2 kg (196 lb 9.6 oz)   SpO2 97%   BMI 27.42 kg/m  MAP 86 Physical Exam: General: Well appearing this am. NAD.  HEENT: Normal. Neck: Supple, JVP 10 cm. Carotids OK.  Cardiac:  Mechanical heart sounds with LVAD hum present.  Lungs:  CTAB, normal effort.  Abdomen:  NT, ND, no HSM. No bruits or masses. +BS  LVAD exit site: Well-healed and incorporated. Dressing dry and intact. No erythema or drainage. Stabilization device present and accurately applied. Driveline dressing changed daily per sterile technique. Extremities:  Warm and dry. No cyanosis, clubbing, rash.  Trace ankle edema.  Neuro:  Alert & oriented x 3. Cranial nerves grossly intact. Moves all 4 extremities w/o difficulty.  Affect pleasant    ASSESSMENT AND PLAN:   1. Chronic systolic CHF: Nonischemic cardiomyopathy, diagnosed 2020.  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  Cath in 12/20 with no significant coronary disease.  Medtronic ICD. Echo in 2/23 showed EF 20-25% with normal RV.  [place] in 11/13 showed markedly elevated filling pressures, primarily pulmonary venous hypertension, low cardiac output, and low PAPI. Echo in 11/23 with EF <20%, mod RV dysfunction.  He was admitted and started on milrinone with improvement in hemodynamics.  We proceeded with HM3 LVAD with LAA clip on 07/04/22. Weight is up and he is more volume overloaded than at last appointment.  NYHA class III symptoms.  - Cut back on sodium in diet and increase torsemide to 60 mg daily.  BMET today and in 2 wks.  - Continue Jardiance 10 mg daily.  - Continue sildenafil 20 tid for RV.  - Continue spironolactone 25 mg daily.  - Continue digoxin 0.125 daily, recent level was ok.  - To start cardiac rehab tomorrow.  - With increased dyspnea, will get CXR today.  2. Atrial fibrillation/atrial flutter: H/o flutter ablation.  Had AF/RVR post-LVAD, went back into NSR.  - Decrease amiodarone to 100 mg daily, check LFTs and TSH.  He will need regular eye exam.  Ultimately, will try to stop amiodarone.  - Continue warfarin.  3. VAD management: s/p HM-3 VAD on 07/04/22. Ramp echo 07/12/22 RV mild to mod HK. Speed turned to 5400. MAP 86 today.  No  alarms, no PIs. LVAD parameters stable. Driveline site ok.  - Now off ASA.  - Continue warfarin, INR goal 2-2.5. - Will repeat ramp echo eventually.  4. H/o DVT: On anticoagulation.  5. CKD stage 3: On Jardiance.  - BMET today.  6. Hypothyroidism - On levothyroxine.  7. Anemia: post-op.  - CBC today.  8. Hyponatremia: Hypervolemic.   Continue to limit po fluid intake.  - BMET today   Followup in 2 wks to reassess volume.   Loralie Champagne 08/24/2022

## 2022-08-24 NOTE — Patient Instructions (Signed)
Increase Torsemide to 60 mg daily - 3 tablets Decrease Amiodarone to 100 mg daily  - 1/2 pill We will obtain a chest xray today Coumadin dosing per Lauren PharmD Return to clinic in 2 week for follow up with Dr Aundra Dubin

## 2022-08-28 ENCOUNTER — Other Ambulatory Visit (HOSPITAL_COMMUNITY): Payer: Self-pay | Admitting: *Deleted

## 2022-08-28 ENCOUNTER — Encounter (HOSPITAL_COMMUNITY)
Admission: RE | Admit: 2022-08-28 | Discharge: 2022-08-28 | Disposition: A | Discharge status: Home / Self Care | Payer: 59 | Source: Ambulatory Visit | Attending: Cardiology | Admitting: Cardiology

## 2022-08-28 VITALS — BP 91/67 | HR 82 | Ht 71.0 in | Wt 190.0 lb

## 2022-08-28 DIAGNOSIS — Z794 Long term (current) use of insulin: Secondary | ICD-10-CM

## 2022-08-28 DIAGNOSIS — I5042 Chronic combined systolic (congestive) and diastolic (congestive) heart failure: Secondary | ICD-10-CM | POA: Insufficient documentation

## 2022-08-28 DIAGNOSIS — Z95811 Presence of heart assist device: Secondary | ICD-10-CM | POA: Insufficient documentation

## 2022-08-28 LAB — GLUCOSE, CAPILLARY: Glucose-Capillary: 128 mg/dL — ABNORMAL HIGH (ref 70–99)

## 2022-08-28 MED ORDER — INSULIN LISPRO (1 UNIT DIAL) 100 UNIT/ML (KWIKPEN): PEN_INJECTOR | SUBCUTANEOUS | 0 refills | Status: DC

## 2022-08-28 NOTE — Telephone Encounter (Signed)
Received call from pt reporting his is running out of his Humalog Kwikpen. Advised pt that his PCP will need to manage diabetes medications. Pt states he has f/u scheduled with PCP 09/05/21. Will provide 1 time refill of Humalog pen to avoid missed doses prior to this appt. Advised pt to bring updated medication list to his PCP appt. He verbalized understanding.   Refill sent to pharmacy for Humalog pen.

## 2022-08-28 NOTE — Progress Notes (Signed)
Cardiac Individual Treatment Plan  Patient Details  Name: Darryl Frye MRN: [id number] Date of Birth: 1961/06/29 Referring Provider:   [name] from 08/28/2022 in [facility]  Referring Provider Dr. Tenny Craw       Initial Encounter Date:  [name] from 08/28/2022 in [facility]  Date 08/28/22       Visit Diagnosis: Chronic combined systolic and diastolic heart failure ([place])  LVAD (left ventricular assist device) present ([place])  Patient's Home Medications on Admission:  Current Outpatient Medications:    acetaminophen (TYLENOL) 325 MG tablet, Take 1-2 tablets (325-650 mg total) by mouth every 4 (four) hours as needed for mild pain., Disp: , Rfl:    amiodarone (PACERONE) 200 MG tablet, Take 0.5 tablets (100 mg total) by mouth daily., Disp: 90 tablet, Rfl: 3   atorvastatin (LIPITOR) 20 MG tablet, Take 1 tablet (20 mg total) by mouth daily., Disp: 90 tablet, Rfl: 3   Calcium Carb-Cholecalciferol 600-10 MG-MCG TABS, Take 1 tablet by mouth daily with breakfast., Disp: 90 tablet, Rfl: 3   digoxin (LANOXIN) 0.125 MG tablet, Take 0.5 tablets (0.0625 mg total) by mouth daily., Disp: 30 tablet, Rfl: 5   empagliflozin (JARDIANCE) 10 MG TABS tablet, Take 1 tablet (10 mg total) by mouth daily., Disp: 30 tablet, Rfl: 6   Fe Fum-Vit C-Vit B12-FA (TRIGELS-F FORTE) CAPS capsule, Take 1 capsule by mouth daily after breakfast., Disp: 30 capsule, Rfl: 6   hydrocortisone cream 1 %, Apply 1 Application topically 2 (two) times daily. Apply to affected areas on arms, back, and legs. DO NOT apply near driveline., Disp: 30 g, Rfl: 6   insulin glargine (SEMGLEE, YFGN,) 100 UNIT/ML Solostar Pen, Inject 22 Units into the skin at bedtime., Disp: 15 mL, Rfl: 0   insulin glargine-yfgn (SEMGLEE) 100 UNIT/ML injection, Inject 0.13 mLs (13 Units total) into the skin daily., Disp: 10 mL, Rfl: 11    insulin lispro (HUMALOG KWIKPEN) 100 UNIT/ML KwikPen, Use 4 units three times a day with meals, Disp: 15 mL, Rfl: 0   Insulin Pen Needle (PEN NEEDLES) 32G X 4 MM MISC, Use with insulin pen with breakfast, with lunch, and with evening meal., Disp: 100 each, Rfl: 0   levothyroxine (SYNTHROID) 25 MCG tablet, Take 1 tablet (25 mcg total) by mouth daily before breakfast., Disp: 90 tablet, Rfl: 3   magnesium oxide (MAG-OX) 400 (240 Mg) MG tablet, Take 1 tablet (400 mg total) by mouth 2 (two) times daily., Disp: 180 tablet, Rfl: 3   melatonin 3 MG TABS tablet, Take 1 tablet (3 mg total) by mouth at bedtime. (Patient not taking: Reported on 08/24/2022), Disp: , Rfl: 0   Multiple Vitamin (MULTIVITAMIN WITH MINERALS) TABS tablet, Take 1 tablet by mouth daily. (Patient not taking: Reported on 08/07/2022), Disp: , Rfl:    pantoprazole (PROTONIX) 40 MG tablet, Take 1 tablet (40 mg total) by mouth daily., Disp: 90 tablet, Rfl: 3   polyethylene glycol (MIRALAX / GLYCOLAX) 17 g packet, Take 17 g by mouth daily as needed for mild constipation. (Patient not taking: Reported on 08/07/2022), Disp: 14 each, Rfl: 0   potassium chloride SA (KLOR-CON M) 20 MEQ tablet, Take 2 tablets (40 mEq total) by mouth daily., Disp: 180 tablet, Rfl: 3   sertraline (ZOLOFT) 50 MG tablet, Take 1 tablet (50 mg total) by mouth daily., Disp: 90 tablet, Rfl: 3   sildenafil (REVATIO) 20 MG tablet, Take 1  tablet (20 mg total) by mouth 3 (three) times daily., Disp: 90 tablet, Rfl: 6   sodium chloride (OCEAN) 0.65 % SOLN nasal spray, Place 2 sprays into both nostrils 4 (four) times daily., Disp: , Rfl: 0   spironolactone (ALDACTONE) 25 MG tablet, Take 1 tablet (25 mg total) by mouth daily., Disp: 90 tablet, Rfl: 3   torsemide (DEMADEX) 20 MG tablet, Take 3 tablets (60 mg total) by mouth daily. 40 mg (2 tablets) daily, or as directed by the heart failure clinic, Disp: 180 tablet, Rfl: 3   warfarin (COUMADIN) 3 MG tablet, Take 1 tablet (3 mg total) by  mouth daily at 4 PM. Take 1 tablet (3 mg) daily, or as directed by heart failure clinic, Disp: 90 tablet, Rfl: 3  Past Medical History: Past Medical History:  Diagnosis Date   Acute kidney injury ([place])    Acute respiratory failure ([place])    Atrial flutter ([place])    on Eliquis   CHF (congestive heart failure) ([place])    EF 20-25% 10/15/21, AICD in place.   Coronary artery disease    Diabetes mellitus without complication ([place])    DVT (deep venous thrombosis) ([place]) 06/2019   Heart murmur    Hypertension    Presence of permanent cardiac pacemaker    Renal disorder     Tobacco Use: Social History   Tobacco Use  Smoking Status Former   Packs/day: 1.00   Years: 20.00   Total pack years: 20.00   Types: Cigarettes   Quit date: 07/07/2019   Years since quitting: 3.1  Smokeless Tobacco Never    Labs: Review Flowsheet  More data exists      Latest Ref Rng & Units 07/17/2022 07/18/2022 07/19/2022 07/20/2022 07/21/2022  Labs for ITP Cardiac and Pulmonary Rehab  O2 Saturation % 66.5  52  51.2  90.2  64.4  69.2  51.5  43.8  50.4     Capillary Blood Glucose: Lab Results  Component Value Date   GLUCAP 128 (H) 08/28/2022   GLUCAP 104 (H) 07/31/2022   GLUCAP 127 (H) 07/31/2022   GLUCAP 195 (H) 07/30/2022   GLUCAP 121 (H) 07/30/2022    POCT Glucose     Row Name 08/28/22 1419             POCT Blood Glucose   Pre-Exercise 126 mg/dL                Exercise Target Goals: Exercise Program Goal: Individual exercise prescription set using results from initial 6 min walk test and THRR while considering  patient's activity barriers and safety.   Exercise Prescription Goal: Starting with aerobic activity 30 plus minutes a day, 3 days per week for initial exercise prescription. Provide home exercise prescription and guidelines that participant acknowledges understanding prior to discharge.  Activity Barriers & Risk Stratification:  Activity Barriers & Cardiac Risk  Stratification - 08/28/22 1335       Activity Barriers & Cardiac Risk Stratification   Activity Barriers Deconditioning;Muscular Weakness;Shortness of Breath;Decreased Ventricular Function;Assistive Device;Other (comment)    Comments neuropathy both feet    Cardiac Risk Stratification High             6 Minute Walk:  6 Minute Walk     Row Name 08/28/22 1427         6 Minute Walk   Phase Initial     Distance 800 feet     Walk Time 6 minutes     # of  Rest Breaks 0     MPH 1.51     METS 2.41     RPE 11     VO2 Peak 8.44     Symptoms No     Resting HR 82 bpm     Resting BP --  76 Doppler     Resting Oxygen Saturation  97 %     Exercise Oxygen Saturation  during 6 min walk 96 %     Max Ex. HR 98 bpm     Max Ex. BP --  86 Doppler     2 Minute Post BP --  80 Doppler              Oxygen Initial Assessment:   Oxygen Re-Evaluation:   Oxygen Discharge (Final Oxygen Re-Evaluation):   Initial Exercise Prescription:  Initial Exercise Prescription - 08/28/22 1500       Date of Initial Exercise RX and Referring Provider   Date 08/28/22    Referring Provider Dr. Tenny Craw    Expected Discharge Date 11/24/22      NuStep   Level 1    SPM 60    Minutes 39      Prescription Details   Frequency (times per week) 3    Duration Progress to 30 minutes of continuous aerobic without signs/symptoms of physical distress      Intensity   THRR 40-80% of Max Heartrate 64-129    Ratings of Perceived Exertion 11-13      Resistance Training   Training Prescription Yes    Weight 3    Reps 10-15             Perform Capillary Blood Glucose checks as needed.  Exercise Prescription Changes:   Exercise Comments:   Exercise Goals and Review:   Exercise Goals     Row Name 08/28/22 1532             Exercise Goals   Increase Physical Activity Yes       Intervention Provide advice, education, support and counseling about physical activity/exercise  needs.;Develop an individualized exercise prescription for aerobic and resistive training based on initial evaluation findings, risk stratification, comorbidities and participant's personal goals.       Expected Outcomes Short Term: Attend rehab on a regular basis to increase amount of physical activity.;[place] Term: Add in home exercise to make exercise part of routine and to increase amount of physical activity.;[place] Term: Exercising regularly at least 3-5 days a week.       Increase Strength and Stamina Yes       Intervention Provide advice, education, support and counseling about physical activity/exercise needs.;Develop an individualized exercise prescription for aerobic and resistive training based on initial evaluation findings, risk stratification, comorbidities and participant's personal goals.       Expected Outcomes Short Term: Increase workloads from initial exercise prescription for resistance, speed, and METs.;Short Term: Perform resistance training exercises routinely during rehab and add in resistance training at home;Suit Term: Improve cardiorespiratory fitness, muscular endurance and strength as measured by increased METs and functional capacity (6MWT)       Able to understand and use rate of perceived exertion (RPE) scale Yes       Intervention Provide education and explanation on how to use RPE scale       Expected Outcomes Short Term: Able to use RPE daily in rehab to express subjective intensity level;Finelli Term:  Able to use RPE to guide intensity level when exercising independently  Knowledge and understanding of Target Heart Rate Range (THRR) Yes       Intervention Provide education and explanation of THRR including how the numbers were predicted and where they are located for reference       Expected Outcomes Short Term: Able to state/look up THRR;Gandolfo Term: Able to use THRR to govern intensity when exercising independently;Short Term: Able to use daily as guideline for  intensity in rehab       Able to check pulse independently Yes       Intervention Provide education and demonstration on how to check pulse in carotid and radial arteries.;Review the importance of being able to check your own pulse for safety during independent exercise       Expected Outcomes Short Term: Able to explain why pulse checking is important during independent exercise;Myrie Term: Able to check pulse independently and accurately       Understanding of Exercise Prescription Yes       Intervention Provide education, explanation, and written materials on patient's individual exercise prescription       Expected Outcomes Short Term: Able to explain program exercise prescription;Sissel Term: Able to explain home exercise prescription to exercise independently                Exercise Goals Re-Evaluation :    Discharge Exercise Prescription (Final Exercise Prescription Changes):   Nutrition:  Target Goals: Understanding of nutrition guidelines, daily intake of sodium 1500mg , cholesterol 200mg , calories 30% from fat and 7% or less from saturated fats, daily to have 5 or more servings of fruits and vegetables.  Biometrics:  Pre Biometrics - 08/28/22 1533       Pre Biometrics   Height 5\' 11"  (1.803 m)    Weight 190 lb 0.6 oz (86.2 kg)    Waist Circumference 38 inches    Hip Circumference 40 inches    Waist to Hip Ratio 0.95 %    BMI (Calculated) 26.52    Triceps Skinfold 5 mm    % Body Fat 21.2 %    Grip Strength 46.6 kg    Flexibility 0 in    Single Leg Stand 0 seconds              Nutrition Therapy Plan and Nutrition Goals:  Nutrition Therapy & Goals - 08/28/22 1338       Intervention Plan   Intervention Nutrition handout(s) given to patient.    Expected Outcomes Short Term Goal: Understand basic principles of dietary content, such as calories, fat, sodium, cholesterol and nutrients.             Nutrition Assessments:  Nutrition Assessments - 08/28/22  1341       MEDFICTS Scores   Pre Score 63            MEDIFICTS Score Key: ?70 Need to make dietary changes  40-70 Heart Healthy Diet ? 40 Therapeutic Level Cholesterol Diet   Picture Your Plate Scores: <62 Unhealthy dietary pattern with much room for improvement. 41-50 Dietary pattern unlikely to meet recommendations for good health and room for improvement. 51-60 More healthful dietary pattern, with some room for improvement.  >60 Healthy dietary pattern, although there may be some specific behaviors that could be improved.    Nutrition Goals Re-Evaluation:   Nutrition Goals Discharge (Final Nutrition Goals Re-Evaluation):   Psychosocial: Target Goals: Acknowledge presence or absence of significant depression and/or stress, maximize coping skills, provide positive support system. Participant is able to verbalize types and  ability to use techniques and skills needed for reducing stress and depression.  Initial Review & Psychosocial Screening:  Initial Psych Review & Screening - 08/28/22 1337       Initial Review   Current issues with Current Sleep Concerns      Family Dynamics   Good Support System? Yes    Comments His brother, his ex wife, his sister in law, his brother in law, and his friends are his support system.      Barriers   Psychosocial barriers to participate in program There are no identifiable barriers or psychosocial needs.      Screening Interventions   Interventions Encouraged to exercise    Expected Outcomes Smiddy Term goal: The participant improves quality of Life and PHQ9 Scores as seen by post scores and/or verbalization of changes;Short Term goal: Identification and review with participant of any Quality of Life or Depression concerns found by scoring the questionnaire.             Quality of Life Scores:  Quality of Life - 08/28/22 1534       Quality of Life   Select Quality of Life      Quality of Life Scores   Health/Function Pre  17.15 %    Socioeconomic Pre 26.25 %    Psych/Spiritual Pre 27.5 %    Family Pre 28 %    GLOBAL Pre 22.48 %            Scores of 19 and below usually indicate a poorer quality of life in these areas.  A difference of  2-3 points is a clinically meaningful difference.  A difference of 2-3 points in the total score of the Quality of Life Index has been associated with significant improvement in overall quality of life, self-image, physical symptoms, and general health in studies assessing change in quality of life.  PHQ-9: Review Flowsheet       08/28/2022  Depression screen PHQ 2/9  Decreased Interest 2  Down, Depressed, Hopeless 0  PHQ - 2 Score 2  Altered sleeping 3  Tired, decreased energy 3  Change in appetite 0  Feeling bad or failure about yourself  0  Trouble concentrating 0  Moving slowly or fidgety/restless 0  Suicidal thoughts 0  PHQ-9 Score 8  Difficult doing work/chores Somewhat difficult   Interpretation of Total Score  Total Score Depression Severity:  1-4 = Minimal depression, 5-9 = Mild depression, 10-14 = Moderate depression, 15-19 = Moderately severe depression, 20-27 = Severe depression   Psychosocial Evaluation and Intervention:  Psychosocial Evaluation - 08/28/22 1415       Psychosocial Evaluation & Interventions   Interventions Stress management education;Relaxation education;Encouraged to exercise with the program and follow exercise prescription    Comments Pt's only potential barrier to participating in CR is transportation. He is not allowed to drive until 08/26/1094, and he will be relying on transportation from his insurance company. He said that the transportation has been reliable so far. He does report sleep concerns, but no other identifiable psychosocial issues. He does not currently take anything to aid his sleep. He scored an 8 on his PHQ-9, and this relates to his sleep, his lack of energy, and his inability to do things that he enjoys. He  states that overall he is coping well with his LVAD. He seems to have a positive outlook, and is motivated to improve. He states that he has a good support system with his ex wife, brother, sister,  in laws, and friends. His goals while in the program are to decrease his SOB, increase his strength/stamina, and to get his legs stronger. He is eager to start the program and is motivated.    Expected Outcomes Pt will continue to have no identifiable psychosocial issues.    Continue Psychosocial Services  No Follow up required             Psychosocial Re-Evaluation:   Psychosocial Discharge (Final Psychosocial Re-Evaluation):   Vocational Rehabilitation: Provide vocational rehab assistance to qualifying candidates.   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.             Education: Education Goals: Education classes will be provided on a weekly basis, covering required topics. Participant will state understanding/return demonstration of topics presented.  Learning Barriers/Preferences:  Learning Barriers/Preferences - 08/28/22 1341       Learning Barriers/Preferences   Learning Barriers None    Learning Preferences Verbal Instruction             Education Topics: Hypertension, Hypertension Reduction -Define heart disease and high blood pressure. Discus how high blood pressure affects the body and ways to reduce high blood pressure.   Exercise and Your Heart -Discuss why it is important to exercise, the FITT principles of exercise, normal and abnormal responses to exercise, and how to exercise safely.   Angina -Discuss definition of angina, causes  of angina, treatment of angina, and how to decrease risk of having angina.   Cardiac Medications -Review what the following cardiac medications are used for, how they affect the body, and side effects that may occur when taking the medications.  Medications include Aspirin, Beta blockers, calcium channel blockers, ACE Inhibitors, angiotensin receptor blockers, diuretics, digoxin, and antihyperlipidemics.   Congestive Heart Failure -Discuss the definition of CHF, how to live with CHF, the signs and symptoms of CHF, and how keep track of weight and sodium intake.   Heart Disease and Intimacy -Discus the effect sexual activity has on the heart, how changes occur during intimacy as we age, and safety during sexual activity.   Smoking Cessation / COPD -Discuss different methods to quit smoking, the health benefits of quitting smoking, and the definition of COPD.   Nutrition I: Fats -Discuss the types of cholesterol, what cholesterol does to the heart, and how cholesterol levels can be controlled.   Nutrition II: Labels -Discuss the different components of food labels and how to read food label   Heart Parts/Heart Disease and PAD -Discuss the anatomy of the heart, the pathway of blood circulation through the heart, and these are affected by heart disease.   Stress I: Signs and Symptoms -Discuss the causes of stress, how stress may lead to anxiety and depression, and ways to limit stress.   Stress II: Relaxation -Discuss different types of relaxation techniques to limit stress.   Warning Signs of Stroke / TIA -Discuss definition of a stroke, what the signs and symptoms are of a stroke, and how to identify when someone is having stroke.   Knowledge Questionnaire Score:  Knowledge Questionnaire Score - 08/28/22 1342       Knowledge Questionnaire Score  Pre Score 21/24             Core Components/Risk Factors/Patient Goals at Admission:  Personal Goals and Risk Factors  at Admission - 08/28/22 1347       Core Components/Risk Factors/Patient Goals on Admission   Improve shortness of breath with ADL's Yes    Intervention Provide education, individualized exercise plan and daily activity instruction to help decrease symptoms of SOB with activities of daily living.    Expected Outcomes Short Term: Improve cardiorespiratory fitness to achieve a reduction of symptoms when performing ADLs;[place]umbo Term: Be able to perform more ADLs without symptoms or delay the onset of symptoms    Diabetes Yes    Intervention Provide education about signs/symptoms and action to take for hypo/hyperglycemia.;Provide education about proper nutrition, including hydration, and aerobic/resistive exercise prescription along with prescribed medications to achieve blood glucose in normal ranges: Fasting glucose 65-99 mg/dL    Expected Outcomes Short Term: Participant verbalizes understanding of the signs/symptoms and immediate care of hyper/hypoglycemia, proper foot care and importance of medication, aerobic/resistive exercise and nutrition plan for blood glucose control.;[place]inger Term: Attainment of HbA1C < 7%.    Heart Failure Yes    Intervention Provide a combined exercise and nutrition program that is supplemented with education, support and counseling about heart failure. Directed toward relieving symptoms such as shortness of breath, decreased exercise tolerance, and extremity edema.    Expected Outcomes Improve functional capacity of life;Short term: Attendance in program 2-3 days a week with increased exercise capacity. Reported lower sodium intake. Reported increased fruit and vegetable intake. Reports medication compliance.;Short term: Daily weights obtained and reported for increase. Utilizing diuretic protocols set by physician.;Parzych term: Adoption of self-care skills and reduction of barriers for early signs and symptoms recognition and intervention leading to self-care maintenance.    Lipids  Yes    Intervention Provide education and support for participant on nutrition & aerobic/resistive exercise along with prescribed medications to achieve LDL 70mg , HDL >40mg .    Expected Outcomes Short Term: Participant states understanding of desired cholesterol values and is compliant with medications prescribed. Participant is following exercise prescription and nutrition guidelines.;[place] Term: Cholesterol controlled with medications as prescribed, with individualized exercise RX and with personalized nutrition plan. Value goals: LDL < 70mg , HDL > 40 mg.    Personal Goal Other Yes    Personal Goal Increase strength, stamina, and leg strength.    Intervention Attend CR three days per week and begin a home exercise program.    Expected Outcomes Pt will meet stated goal.             Core Components/Risk Factors/Patient Goals Review:    Core Components/Risk Factors/Patient Goals at Discharge (Final Review):    ITP Comments:   Comments: Patient arrived for 1st visit/orientation/education at 1230. Patient was referred to CR by Dr. Tenny Craw due to chronic combined systolic and diastolic heart failure (A63.01); LVAD present (Z95.811). During orientation advised patient on arrival and appointment times what to wear, what to do before, during and after exercise. Reviewed attendance and class policy. Specific LVAD guidelines reviewed as well (pt must bring back up controller, battery, and battery clips in order to exercise). Pt is scheduled to return Cardiac Rehab on 09/04/2022 at 1445. Pt was advised to come to class 15 minutes before class starts.  Discussed RPE/Dpysnea scales. Patient participated in warm up stretches. Patient was able to complete 6 minute walk test.  Telemetry: NSR. Patient was measured for the equipment. Discussed equipment safety  with patient. Took patient pre-anthropometric measurements. Patient finished visit at 1420.

## 2022-08-30 ENCOUNTER — Other Ambulatory Visit (HOSPITAL_COMMUNITY): Payer: Self-pay | Admitting: *Deleted

## 2022-08-30 DIAGNOSIS — Z5181 Encounter for therapeutic drug level monitoring: Secondary | ICD-10-CM

## 2022-08-30 DIAGNOSIS — Z95811 Presence of heart assist device: Secondary | ICD-10-CM

## 2022-08-30 DIAGNOSIS — Z7901 Long term (current) use of anticoagulants: Secondary | ICD-10-CM

## 2022-08-31 ENCOUNTER — Other Ambulatory Visit: Payer: Self-pay | Admitting: Cardiology

## 2022-09-04 ENCOUNTER — Ambulatory Visit (HOSPITAL_COMMUNITY): Payer: Self-pay | Admitting: Pharmacist

## 2022-09-04 ENCOUNTER — Encounter (HOSPITAL_COMMUNITY): Payer: Self-pay | Admitting: Unknown Physician Specialty

## 2022-09-04 ENCOUNTER — Ambulatory Visit (HOSPITAL_COMMUNITY)
Admission: RE | Admit: 2022-09-04 | Discharge: 2022-09-04 | Disposition: A | Discharge status: Home / Self Care | Payer: 59 | Source: Ambulatory Visit | Attending: Cardiology | Admitting: Cardiology

## 2022-09-04 ENCOUNTER — Encounter (HOSPITAL_COMMUNITY): Payer: Medicaid Other

## 2022-09-04 ENCOUNTER — Encounter (HOSPITAL_COMMUNITY)
Admission: RE | Admit: 2022-09-04 | Discharge: 2022-09-04 | Disposition: A | Discharge status: Home / Self Care | Payer: 59 | Source: Ambulatory Visit | Attending: Cardiology | Admitting: Cardiology

## 2022-09-04 VITALS — BP 106/64 | Ht 71.0 in | Wt 193.4 lb

## 2022-09-04 VITALS — Wt 194.9 lb

## 2022-09-04 DIAGNOSIS — Z7901 Long term (current) use of anticoagulants: Secondary | ICD-10-CM | POA: Insufficient documentation

## 2022-09-04 DIAGNOSIS — R0602 Shortness of breath: Secondary | ICD-10-CM | POA: Diagnosis not present

## 2022-09-04 DIAGNOSIS — Z95811 Presence of heart assist device: Secondary | ICD-10-CM | POA: Diagnosis present

## 2022-09-04 DIAGNOSIS — I5042 Chronic combined systolic (congestive) and diastolic (congestive) heart failure: Secondary | ICD-10-CM

## 2022-09-04 DIAGNOSIS — Z5181 Encounter for therapeutic drug level monitoring: Secondary | ICD-10-CM | POA: Diagnosis not present

## 2022-09-04 DIAGNOSIS — I509 Heart failure, unspecified: Secondary | ICD-10-CM | POA: Diagnosis not present

## 2022-09-04 LAB — BASIC METABOLIC PANEL
Anion gap: 12 (ref 5–15)
BUN: 26 mg/dL — ABNORMAL HIGH (ref 6–20)
CO2: 25 mmol/L (ref 22–32)
Calcium: 9.3 mg/dL (ref 8.9–10.3)
Chloride: 96 mmol/L — ABNORMAL LOW (ref 98–111)
Creatinine, Ser: 1.19 mg/dL (ref 0.61–1.24)
GFR, Estimated: >60 mL/min (ref >60)
Glucose, Bld: 126 mg/dL — ABNORMAL HIGH (ref 70–99)
Potassium: 3.7 mmol/L (ref 3.5–5.1)
Sodium: 133 mmol/L — ABNORMAL LOW (ref 135–145)

## 2022-09-04 LAB — PROTIME-INR
INR: 1.4 — ABNORMAL HIGH (ref 0.8–1.2)
Prothrombin Time: 17.1 seconds — ABNORMAL HIGH (ref 11.4–15.2)

## 2022-09-04 LAB — CBC
HCT: 33.6 % — ABNORMAL LOW (ref 39.0–52.0)
Hemoglobin: 10.4 g/dL — ABNORMAL LOW (ref 13.0–17.0)
MCH: 23.2 pg — ABNORMAL LOW (ref 26.0–34.0)
MCHC: 31 g/dL (ref 30.0–36.0)
MCV: 75 fL — ABNORMAL LOW (ref 80.0–100.0)
Platelets: 509 10*3/uL — ABNORMAL HIGH (ref 150–400)
RBC: 4.48 MIL/uL (ref 4.22–5.81)
RDW: 19.9 % — ABNORMAL HIGH (ref 11.5–15.5)
WBC: 8.8 10*3/uL (ref 4.0–10.5)
nRBC: 0 % (ref 0.0–0.2)

## 2022-09-04 LAB — DIGOXIN LEVEL: Digoxin Level: 0.5 ng/mL — ABNORMAL LOW (ref 0.8–2.0)

## 2022-09-04 LAB — MAGNESIUM: Magnesium: 2.2 mg/dL (ref 1.7–2.4)

## 2022-09-04 LAB — LACTATE DEHYDROGENASE: LDH: 208 U/L — ABNORMAL HIGH (ref 98–192)

## 2022-09-04 LAB — GLUCOSE, CAPILLARY: Glucose-Capillary: 171 mg/dL — ABNORMAL HIGH (ref 70–99)

## 2022-09-04 MED ORDER — TORSEMIDE 20 MG PO TABS: ORAL_TABLET | ORAL | 3 refills | Status: DC

## 2022-09-04 NOTE — Progress Notes (Signed)
Daily Session Note  Patient Details  Name: Darryl Frye MRN: [id number] Date of Birth: Jul 30, 1961 Referring Provider:   [name] from 08/28/2022 in [place]  Referring Provider Dr. Tenny Craw       Encounter Date: 09/04/2022  Check In:  Session Check In - 09/04/22 1425       Check-In   Supervising physician immediately available to respond to emergencies [facility] MD immediately available    Physician(s) Dr. Dellia Cloud    Staff Present Hoy Register MHA, MS, ACSM-CEP;Leana Roe, BS, Exercise Physiologist;Daphyne Hassell Done, RN, BSN    Virtual Visit No    Medication changes reported     Yes    Comments Increased Torsemide 80mg  (4 tabs) alternating 60mg  (3 tabs)    Fall or balance concerns reported    Yes    Comments He has not fallen, but he does use a rollator due to weakness.    Warm-up and Cool-down Performed as group-led Higher education careers adviser Performed Yes    VAD Patient? Yes    PAD/SET Patient? No      VAD patient   Has back up controller? Yes    Has spare charged batteries? Yes    Has battery cables? Yes    Has compatible battery clips? Yes      Pain Assessment   Currently in Pain? No/denies    Pain Score 0-No pain    Multiple Pain Sites No             Capillary Blood Glucose: Results for orders placed or performed during the hospital encounter of 09/04/22 (from the past 24 hour(s))  Glucose, capillary     Status: Abnormal   Collection Time: 09/04/22  2:28 PM  Result Value Ref Range   Glucose-Capillary 171 (H) 70 - 99 mg/dL      Social History   Tobacco Use  Smoking Status Former   Packs/day: 1.00   Years: 20.00   Total pack years: 20.00   Types: Cigarettes   Quit date: 07/07/2019   Years since quitting: 3.1  Smokeless Tobacco Never    Goals Met:  Independence with exercise equipment Exercise tolerated well No report of concerns or symptoms today Strength training  completed today  Goals Unmet:  Not Applicable  Comments: check out 1545   Dr. Carlyle Dolly is Medical Director for [place]

## 2022-09-04 NOTE — Patient Instructions (Signed)
Increase Torsemide 80mg  (4 pills) alternating with 60 (3 pills) Coumadin dosing per Lauren PharmD Return to clinic in 1 mo for follow up with Dr Aundra Dubin along with RAMP echo

## 2022-09-04 NOTE — Progress Notes (Signed)
LVAD INR
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
## 2022-09-06 ENCOUNTER — Encounter (HOSPITAL_COMMUNITY)
Admission: RE | Admit: 2022-09-06 | Discharge: 2022-09-06 | Disposition: A | Discharge status: Home / Self Care | Payer: 59 | Source: Ambulatory Visit | Attending: Cardiology | Admitting: Cardiology

## 2022-09-06 DIAGNOSIS — Z95811 Presence of heart assist device: Secondary | ICD-10-CM

## 2022-09-06 DIAGNOSIS — I5042 Chronic combined systolic (congestive) and diastolic (congestive) heart failure: Secondary | ICD-10-CM | POA: Diagnosis not present

## 2022-09-06 LAB — GLUCOSE, CAPILLARY: Glucose-Capillary: 215 mg/dL — ABNORMAL HIGH (ref 70–99)

## 2022-09-06 NOTE — Progress Notes (Signed)
Daily Session Note  Patient Details  Name: Darryl Frye MRN: [id number] Date of Birth: 06/17/1961 Referring Provider:   [name] from 08/28/2022 in [place]  Referring Provider Dr. Tenny Craw       Encounter Date: 09/06/2022  Check In:  Session Check In - 09/06/22 1445       Check-In   Supervising physician immediately available to respond to emergencies [facility] MD immediately available    Physician(s) Dr. Harl Bowie    Location [facility]    Staff Present Hoy Register MHA, MS, ACSM-CEP;[employer] BSN, RN;Heather Mel Almond, [place], Exercise Physiologist    Virtual Visit No    Medication changes reported     No    Fall or balance concerns reported    Yes    Comments He has not fallen, but he does use a rollator due to weakness.    Tobacco Cessation No Change    Warm-up and Cool-down Performed as group-led instruction    Resistance Training Performed Yes    VAD Patient? Yes    PAD/SET Patient? No      VAD patient   Has back up controller? Yes    Has spare charged batteries? Yes    Has battery cables? Yes    Has compatible battery clips? Yes      Pain Assessment   Currently in Pain? No/denies    Pain Score 0-No pain    Multiple Pain Sites No             Capillary Blood Glucose: Results for orders placed or performed during the hospital encounter of 09/06/22 (from the past 24 hour(s))  Glucose, capillary     Status: Abnormal   Collection Time: 09/06/22  2:33 PM  Result Value Ref Range   Glucose-Capillary 215 (H) 70 - 99 mg/dL      Social History   Tobacco Use  Smoking Status Former   Packs/day: 1.00   Years: 20.00   Total pack years: 20.00   Types: Cigarettes   Quit date: 07/07/2019   Years since quitting: 3.1  Smokeless Tobacco Never    Goals Met:  Independence with exercise equipment Exercise tolerated well No report of concerns or symptoms today Strength training  completed today  Goals Unmet:  Not Applicable  Comments: checkout time is 1545   Dr. Carlyle Dolly is Medical Director for [place]

## 2022-09-08 ENCOUNTER — Telehealth (HOSPITAL_COMMUNITY): Payer: Self-pay | Admitting: Unknown Physician Specialty

## 2022-09-08 ENCOUNTER — Other Ambulatory Visit (HOSPITAL_COMMUNITY): Payer: Self-pay | Admitting: Unknown Physician Specialty

## 2022-09-08 ENCOUNTER — Encounter (HOSPITAL_COMMUNITY)
Admission: RE | Admit: 2022-09-08 | Discharge: 2022-09-08 | Disposition: A | Discharge status: Home / Self Care | Payer: 59 | Source: Ambulatory Visit | Attending: Cardiology | Admitting: Cardiology

## 2022-09-08 ENCOUNTER — Encounter: Payer: Medicaid Other | Admitting: Internal Medicine

## 2022-09-08 DIAGNOSIS — I5042 Chronic combined systolic (congestive) and diastolic (congestive) heart failure: Secondary | ICD-10-CM

## 2022-09-08 DIAGNOSIS — Z7901 Long term (current) use of anticoagulants: Secondary | ICD-10-CM

## 2022-09-08 DIAGNOSIS — Z95811 Presence of heart assist device: Secondary | ICD-10-CM

## 2022-09-08 LAB — GLUCOSE, CAPILLARY: Glucose-Capillary: 140 mg/dL — ABNORMAL HIGH (ref 70–99)

## 2022-09-08 NOTE — Progress Notes (Signed)
Daily Session Note  Patient Details  Name: Darryl Frye MRN: [id number] Date of Birth: 08/20/1961 Referring Provider:   [name] from 08/28/2022 in [place]  Referring Provider Dr. Tenny Craw       Encounter Date: 09/08/2022  Check In:  Session Check In - 09/08/22 0815       Check-In   Supervising physician immediately available to respond to emergencies [facility] MD immediately available    Physician(s) Dr. Harl Bowie    Location [facility]    Staff Present Hoy Register MHA, MS, ACSM-CEP;Madelyn Flavors, RN, BSN;Heather Mel Almond, BS, Exercise Physiologist    Virtual Visit No    Medication changes reported     No    Fall or balance concerns reported    Yes    Comments He has not fallen, but he does use a rollator due to weakness.    Tobacco Cessation No Change    Warm-up and Cool-down Performed as group-led instruction    Resistance Training Performed Yes    VAD Patient? Yes    PAD/SET Patient? No      VAD patient   Has back up controller? Yes    Has spare charged batteries? Yes    Has battery cables? Yes    Has compatible battery clips? Yes      Pain Assessment   Currently in Pain? No/denies    Pain Score 0-No pain    Multiple Pain Sites No             Capillary Blood Glucose: Results for orders placed or performed during the hospital encounter of 09/06/22 (from the past 24 hour(s))  Glucose, capillary     Status: Abnormal   Collection Time: 09/08/22  7:56 AM  Result Value Ref Range   Glucose-Capillary 140 (H) 70 - 99 mg/dL      Social History   Tobacco Use  Smoking Status Former   Packs/day: 1.00   Years: 20.00   Total pack years: 20.00   Types: Cigarettes   Quit date: 07/07/2019   Years since quitting: 3.1  Smokeless Tobacco Never    Goals Met:  Independence with exercise equipment Exercise tolerated well No report of concerns or symptoms today Strength training completed  today  Goals Unmet:  Not Applicable  Comments: checkout time is 0915   Dr. Carlyle Dolly is Medical Director for [place]

## 2022-09-08 NOTE — Progress Notes (Deleted)
Daily Session Note  Patient Details  Name: Darryl Frye MRN: [id number] Date of Birth: [birth year] Referring Provider:   [name] from 08/28/2022 in [place]  Referring Provider Dr. Tenny Craw       Encounter Date: 09/08/2022  Check In:  Session Check In - 09/08/22 0815       Check-In   Supervising physician immediately available to respond to emergencies [facility] MD immediately available    Physician(s) Dr. Harl Bowie    Location [facility]    Staff Present Hoy Register MHA, MS, ACSM-CEP;Madelyn Flavors, RN, BSN;Heather Mel Almond, BS, Exercise Physiologist    Virtual Visit No    Medication changes reported     No    Fall or balance concerns reported    Yes    Comments He has not fallen, but he does use a rollator due to weakness.    Tobacco Cessation No Change    Warm-up and Cool-down Performed as group-led instruction    Resistance Training Performed Yes    VAD Patient? Yes    PAD/SET Patient? No      VAD patient   Has back up controller? Yes    Has spare charged batteries? Yes    Has battery cables? Yes    Has compatible battery clips? Yes      Pain Assessment   Currently in Pain? No/denies    Pain Score 0-No pain    Multiple Pain Sites No             Capillary Blood Glucose: Results for orders placed or performed during the hospital encounter of 09/06/22 (from the past 24 hour(s))  Glucose, capillary     Status: Abnormal   Collection Time: 09/08/22  7:56 AM  Result Value Ref Range   Glucose-Capillary 140 (H) 70 - 99 mg/dL      Social History   Tobacco Use  Smoking Status Former   Packs/day: 1.00   Years: 20.00   Total pack years: 20.00   Types: Cigarettes   Quit date: 07/07/2019   Years since quitting: 3.1  Smokeless Tobacco Never    Goals Met:  Independence with exercise equipment Exercise tolerated well No report of concerns or symptoms today Strength training completed  today  Goals Unmet:  Not Applicable  Comments: checkout time is 0915   Dr. Carlyle Dolly is Medical Director for [facility]

## 2022-09-08 NOTE — Telephone Encounter (Signed)
Pt let VM stating that he thinks he had an appt Monday 1/22 but doesn't "see" an appt. VAD coord called pt back and informed him that he needs to go to the lab at [facility] on Monday before [facility] and have his blood drawn. Pt needs BMET and INR. VAD coordinator left a msg on pts VM.  Tanda Rockers RN, BSN VAD Coordinator 24/7 Pager [phone]

## 2022-09-11 ENCOUNTER — Other Ambulatory Visit (HOSPITAL_COMMUNITY): Payer: Medicaid Other

## 2022-09-11 ENCOUNTER — Other Ambulatory Visit (HOSPITAL_COMMUNITY): Payer: 59

## 2022-09-11 ENCOUNTER — Encounter (HOSPITAL_COMMUNITY)
Admission: RE | Admit: 2022-09-11 | Discharge: 2022-09-11 | Disposition: A | Discharge status: Home / Self Care | Payer: 59 | Source: Ambulatory Visit | Attending: Cardiology | Admitting: Cardiology

## 2022-09-11 DIAGNOSIS — I5042 Chronic combined systolic (congestive) and diastolic (congestive) heart failure: Secondary | ICD-10-CM | POA: Diagnosis not present

## 2022-09-11 DIAGNOSIS — Z95811 Presence of heart assist device: Secondary | ICD-10-CM

## 2022-09-11 NOTE — Progress Notes (Signed)
Daily Session Note  Patient Details  Name: Darryl Frye MRN: [id number] Date of Birth: 10/15/1960 Referring Provider:   [name] from 08/28/2022 in [place]  Referring Provider Dr. Tenny Craw       Encounter Date: 09/11/2022  Check In:  Session Check In - 09/11/22 1440       Check-In   Supervising physician immediately available to respond to emergencies [facility] MD immediately available    Physician(s) Dr. Harl Bowie    Location [facility]    Staff Present [name] Hassell Done, RN, BSN;Heather Mel Almond, BS, Exercise Physiologist;Debra Wynetta Emery, RN, BSN    Virtual Visit No    Medication changes reported     Yes    Comments Increased Torsemide 80mg  (4 tabs) alternating 60mg  (3 tabs)    Fall or balance concerns reported    Yes    Comments He has not fallen, but he does use a rollator due to weakness.    Tobacco Cessation No Change    Warm-up and Cool-down Performed as group-led instruction    Resistance Training Performed Yes    VAD Patient? Yes      VAD patient   Has back up controller? Yes    Has spare charged batteries? Yes    Has battery cables? Yes    Has compatible battery clips? Yes      Pain Assessment   Currently in Pain? No/denies    Pain Score 0-No pain    Multiple Pain Sites No             Capillary Blood Glucose: No results found for this or any previous visit (from the past 24 hour(s)).    Social History   Tobacco Use  Smoking Status Former   Packs/day: 1.00   Years: 20.00   Total pack years: 20.00   Types: Cigarettes   Quit date: 07/07/2019   Years since quitting: 3.1  Smokeless Tobacco Never    Goals Met:  Independence with exercise equipment Exercise tolerated well No report of concerns or symptoms today Strength training completed today  Goals Unmet:  Not Applicable  Comments: Checkout at 1545.   Dr. Carlyle Dolly is Medical Director for [facility]

## 2022-09-12 ENCOUNTER — Encounter: Payer: Self-pay | Admitting: Physical Medicine and Rehabilitation

## 2022-09-12 ENCOUNTER — Telehealth (HOSPITAL_COMMUNITY): Payer: Self-pay | Admitting: Unknown Physician Specialty

## 2022-09-12 ENCOUNTER — Encounter: Payer: 59 | Attending: Physical Medicine and Rehabilitation | Admitting: Physical Medicine and Rehabilitation

## 2022-09-12 VITALS — BP 106/76 | HR 82 | Ht 71.0 in | Wt 194.0 lb

## 2022-09-12 DIAGNOSIS — R570 Cardiogenic shock: Secondary | ICD-10-CM | POA: Diagnosis present

## 2022-09-12 DIAGNOSIS — I509 Heart failure, unspecified: Secondary | ICD-10-CM

## 2022-09-12 DIAGNOSIS — I4892 Unspecified atrial flutter: Secondary | ICD-10-CM | POA: Diagnosis present

## 2022-09-12 DIAGNOSIS — I480 Paroxysmal atrial fibrillation: Secondary | ICD-10-CM | POA: Insufficient documentation

## 2022-09-12 MED ORDER — FREESTYLE LIBRE 3 SENSOR MISC: 1.0000 | Freq: Every day | 3 refills | Status: DC

## 2022-09-12 MED ORDER — FREESTYLE LIBRE 3 READER DEVI: 1.0000 | Freq: Every day | 3 refills | Status: DC

## 2022-09-12 NOTE — Progress Notes (Signed)
Subjective:    Patient ID: Darryl Frye, male    DOB: 06-08-1961, 62 y.o.   MRN: [id number]  HPI Darryl Frye is a 62 year old man who presents for HFU for cardiac debility.   No pain  Nosebleeds resolved Breathing improved Just started rehab last week. Working on leg strengthening Walking with a walker No shortness of breath and chest pain Moving bowels regularly BP looks great Doing cardiac rehab in [place] Taking insulin Feeling great overall! Has regular cardiology follow-up Would like to get a continuous blood glucose monitor   Pain Inventory Average Pain 0 Pain Right Now 0 My pain is  no pain  In the last 24 hours, has pain interfered with the following? General activity 0 Relation with others 0 Enjoyment of life 0 What TIME of day is your pain at its worst? No pain Sleep (in general) Fair  Pain is worse with:  no pain Pain improves with:  no pain Relief from Meds:  no pain  walk with assistance use a cane use a walker ability to climb steps?  yes do you drive?  yes Do you have any goals in this area?  yes  disabled: date disabled 2021 Do you have any goals in this area?  yes  weakness numbness trouble walking  Any changes since last visit?  yes , xray  Any changes since last visit?  no    Family History  Problem Relation Age of Onset   Hyperlipidemia Mother    Heart disease Mother        has PPM   Diabetes Mother    Hypertension Mother    Heart disease Maternal Grandmother    Prostate cancer Maternal Grandfather    Colon cancer Neg Hx    Colon polyps Neg Hx    Social History   Socioeconomic History   Marital status: Divorced    Spouse name: Not on file   Number of children: Not on file   Years of education: Not on file   Highest education level: Not on file  Occupational History   Not on file  Tobacco Use   Smoking status: Former    Packs/day: 1.00    Years: 20.00    Total pack years: 20.00    Types: Cigarettes    Quit  date: 07/07/2019    Years since quitting: 3.1   Smokeless tobacco: Never  Vaping Use   Vaping Use: Never used  Substance and Sexual Activity   Alcohol use: Not Currently   Drug use: Not Currently    Types: "Crack" cocaine    Comment: last used over 2 years ago (10/24/21).   Sexual activity: Not on file  Other Topics Concern   Not on file  Social History Narrative   Not on file   Social Determinants of Health   Financial Resource Strain: Low Risk  (06/29/2022)   Overall Financial Resource Strain (CARDIA)    Difficulty of Paying Living Expenses: Not hard at all  Food Insecurity: No Food Insecurity (06/29/2022)   Hunger Vital Sign    Worried About Running Out of Food in the Last Year: Never true    Ran Out of Food in the Last Year: Never true  Transportation Needs: No Transportation Needs (06/29/2022)   PRAPARE - Hydrologist (Medical): No    Lack of Transportation (Non-Medical): No  Physical Activity: Not on file  Stress: Not on file  Social Connections: Not on file  Past Surgical History:  Procedure Laterality Date   A-FLUTTER ABLATION N/A 01/12/2022   Procedure: A-FLUTTER ABLATION;  Surgeon: Evans Lance, MD;  Location: [facility];  Service: Cardiovascular;  Laterality: N/A;   COLONOSCOPY     Dr. Posey Pronto; One 4-6 mm hyperplastic rectal polyp, mid left-sided diverticulosis.   CYST REMOVAL NECK     HERNIA REPAIR     INSERTION OF IMPLANTABLE LEFT VENTRICULAR ASSIST DEVICE N/A 07/04/2022   Procedure: INSERTION OF IMPLANTABLE LEFT VENTRICULAR ASSIST DEVICE; ATRICLIP 29;  Surgeon: Neomia Glass, MD;  Location: [facility];  Service: Open Heart Surgery;  Laterality: N/A;   RIGHT HEART CATH N/A 06/27/2022   Procedure: RIGHT HEART CATH;  Surgeon: Larey Dresser, MD;  Location: [facility];  Service: Cardiovascular;  Laterality: N/A;   RIGHT HEART CATH N/A 06/29/2022   Procedure: RIGHT HEART CATH;  Surgeon: Larey Dresser, MD;  Location: [facility];  Service: Cardiovascular;  Laterality: N/A;   TEE WITHOUT CARDIOVERSION N/A 07/04/2022   Procedure: TRANSESOPHAGEAL ECHOCARDIOGRAM (TEE);  Surgeon: Neomia Glass, MD;  Location: [facility];  Service: Open Heart Surgery;  Laterality: N/A;   TONSILLECTOMY     Past Medical History:  Diagnosis Date   Acute kidney injury ([place])    Acute respiratory failure (H[facility])    Atrial flutter (H[facility])    on Eliquis   CHF (congestive heart failure) ([place])    EF 20-25% 10/15/21, AICD in place.   Coronary artery disease    Diabetes mellitus without complication (HCC)    DVT (deep venous thrombosis) ([place]) 06/2019   Heart murmur    Hypertension    Presence of permanent cardiac pacemaker    Renal disorder    BP 106/76   Pulse 82   Ht 5\' 11"  (1.803 m)   Wt 194 lb (88 kg)   SpO2 96%   BMI 27.06 kg/m   Opioid Risk Score:   Fall Risk Score:  `1  Depression screen [facility] 2/9     08/28/2022    1:33 PM  Depression screen PHQ 2/9  Decreased Interest 2  Down, Depressed, Hopeless 0  PHQ - 2 Score 2  Altered sleeping 3  Tired, decreased energy 3  Change in appetite 0  Feeling bad or failure about yourself  0  Trouble concentrating 0  Moving slowly or fidgety/restless 0  Suicidal thoughts 0  PHQ-9 Score 8  Difficult doing work/chores Somewhat difficult    Review of Systems  Musculoskeletal:  Positive for gait problem.  Neurological:  Positive for weakness and numbness.  All other systems reviewed and are negative.      Objective:   Physical Exam  Gen: no distress, normal appearing HEENT: oral mucosa pink and moist, NCAT Cardio: Reg rate Chest: normal effort, normal rate of breathing Abd: soft, non-distended Ext: no edema Psych: pleasant, normal affect Skin: intact Neuro: Alert and oriented x3 MSK: 5/5 strength thorughout      Assessment & Plan:   1) Cardiogenic shock: -check weight every day -continue fluid pills  2) Diabetes: -check CBGs daily, log, and bring log to  follow-up appointment -continue insulin -Janece Canterbury 3 prescribed -avoid sugar, bread, pasta, rice -avoid snacking -perform daily foot exam and at least annual eye exam -try to incorporate into your diet some of the following foods which are good for diabetes: 1) cinnamon- imitates effects of insulin, increasing glucose transport into cells ([place] or [place] cinnamon is best, least processed) 2)  nuts- can slow down the blood sugar response of carbohydrate rich foods 3) oatmeal- contains and anti-inflammatory compound avenanthramide 4) whole-milk yogurt (best types are no sugar, [place] yogurt, or goat/sheep yogurt) 5) beans- high in protein, fiber, and vitamins, low glycemic index 6) broccoli- great source of vitamin A and C 7) quinoa- higher in protein and fiber than other grains 8) spinach- high in vitamin A, fiber, and protein 9) olive oil- reduces glucose levels, LDL, and triglycerides 10) salmon- excellent amount of omega-3-fatty acids 11) walnuts- rich in antioxidants 12) apples- high in fiber and quercetin 13) carrots- highly nutritious with low impact on blood sugar 14) eggs- improve HDL (good cholesterol), high in protein, keep you satiated 15) turmeric: improves blood sugars, cardiovascular disease, and protects kidney health 16) garlic: improves blood sugar, blood pressure, pain 17) tomatoes: highly nutritious with low impact on blood sugar

## 2022-09-12 NOTE — Telephone Encounter (Signed)
Pt did not get labs drawn at [facility] lab yesterday as instructed. We have been leaving msgs on pts vm but I'm not sure he is getting those messages. We are unable to get pt on the phone when we call, it goes to VM. Left msg informing pt that it is vital to his care that we get Coumadin levels.   Tanda Rockers RN, BSN VAD Coordinator 24/7 Pager [phone]

## 2022-09-13 ENCOUNTER — Other Ambulatory Visit (HOSPITAL_COMMUNITY): Payer: Self-pay

## 2022-09-13 ENCOUNTER — Other Ambulatory Visit (HOSPITAL_COMMUNITY)
Admission: RE | Admit: 2022-09-13 | Discharge: 2022-09-13 | Disposition: A | Discharge status: Home / Self Care | Payer: 59 | Source: Ambulatory Visit | Attending: Cardiology | Admitting: Cardiology

## 2022-09-13 ENCOUNTER — Encounter (HOSPITAL_COMMUNITY)
Admission: RE | Admit: 2022-09-13 | Discharge: 2022-09-13 | Disposition: A | Discharge status: Home / Self Care | Payer: 59 | Source: Ambulatory Visit | Attending: Cardiology | Admitting: Cardiology

## 2022-09-13 ENCOUNTER — Ambulatory Visit (HOSPITAL_COMMUNITY): Payer: Self-pay | Admitting: Pharmacist

## 2022-09-13 DIAGNOSIS — Z7901 Long term (current) use of anticoagulants: Secondary | ICD-10-CM

## 2022-09-13 DIAGNOSIS — Z95811 Presence of heart assist device: Secondary | ICD-10-CM | POA: Insufficient documentation

## 2022-09-13 DIAGNOSIS — I5042 Chronic combined systolic (congestive) and diastolic (congestive) heart failure: Secondary | ICD-10-CM | POA: Diagnosis not present

## 2022-09-13 LAB — PROTIME-INR
INR: 1.7 — ABNORMAL HIGH (ref 0.8–1.2)
Prothrombin Time: 20.2 seconds — ABNORMAL HIGH (ref 11.4–15.2)

## 2022-09-13 LAB — BASIC METABOLIC PANEL
Anion gap: 12 (ref 5–15)
BUN: 29 mg/dL — ABNORMAL HIGH (ref 6–20)
CO2: 25 mmol/L (ref 22–32)
Calcium: 9.4 mg/dL (ref 8.9–10.3)
Chloride: 92 mmol/L — ABNORMAL LOW (ref 98–111)
Creatinine, Ser: 1.42 mg/dL — ABNORMAL HIGH (ref 0.61–1.24)
GFR, Estimated: 57 mL/min — ABNORMAL LOW (ref >60)
Glucose, Bld: 237 mg/dL — ABNORMAL HIGH (ref 70–99)
Potassium: 3.9 mmol/L (ref 3.5–5.1)
Sodium: 129 mmol/L — ABNORMAL LOW (ref 135–145)

## 2022-09-13 NOTE — Progress Notes (Signed)
Daily Session Note  Patient Details  Name: Darryl Frye MRN: [id number] Date of Birth: July 20, 1961 Referring Provider:   [name] from 08/28/2022 in [place]  Referring Provider Dr. Tenny Craw       Encounter Date: 09/13/2022  Check In:  Session Check In - 09/13/22 0815       Check-In   Supervising physician immediately available to respond to emergencies [facility] MD immediately available    Physician(s) Dr. Domenic Polite    Location [facility]    Staff Present Leana Roe, BS, Exercise Physiologist;Dalton Sherrie George, MS, ACSM-CEP    Virtual Visit No    Medication changes reported     No    Fall or balance concerns reported    Yes    Comments He has not fallen, but he does use a rollator due to weakness.    Tobacco Cessation No Change    Warm-up and Cool-down Performed as group-led instruction    Resistance Training Performed Yes    VAD Patient? Yes    PAD/SET Patient? No      VAD patient   Has back up controller? Yes    Has spare charged batteries? Yes    Has battery cables? Yes    Has compatible battery clips? Yes      Pain Assessment   Currently in Pain? No/denies    Pain Score 0-No pain    Multiple Pain Sites No             Capillary Blood Glucose: No results found for this or any previous visit (from the past 24 hour(s)).    Social History   Tobacco Use  Smoking Status Former   Packs/day: 1.00   Years: 20.00   Total pack years: 20.00   Types: Cigarettes   Quit date: 07/07/2019   Years since quitting: 3.1  Smokeless Tobacco Never    Goals Met:  Independence with exercise equipment Exercise tolerated well No report of concerns or symptoms today Strength training completed today  Goals Unmet:  Not Applicable  Comments: check out 0915   Dr. Carlyle Dolly is Medical Director for Darryl Frye

## 2022-09-13 NOTE — Progress Notes (Signed)
Remote ICD transmission.   

## 2022-09-15 ENCOUNTER — Encounter (HOSPITAL_COMMUNITY): Payer: 59

## 2022-09-18 ENCOUNTER — Encounter (HOSPITAL_COMMUNITY)
Admission: RE | Admit: 2022-09-18 | Discharge: 2022-09-18 | Disposition: A | Discharge status: Home / Self Care | Payer: 59 | Source: Ambulatory Visit | Attending: Cardiology | Admitting: Cardiology

## 2022-09-18 VITALS — Wt 197.1 lb

## 2022-09-18 DIAGNOSIS — Z95811 Presence of heart assist device: Secondary | ICD-10-CM

## 2022-09-18 DIAGNOSIS — I5042 Chronic combined systolic (congestive) and diastolic (congestive) heart failure: Secondary | ICD-10-CM

## 2022-09-18 NOTE — Progress Notes (Signed)
Daily Session Note  Patient Details  Name: Darryl Frye MRN: [id number] Date of Birth: [birth year] Referring Provider:   [name] from 08/28/2022 in [place]  Referring Provider Dr. Tenny Craw       Encounter Date: 09/18/2022  Check In:  Session Check In - 09/18/22 1445       Check-In   Supervising physician immediately available to respond to emergencies [facility] MD immediately available    Physician(s) Dr. Dellia Cloud    Location [facility]    Staff Present Leana Roe, BS, Exercise Physiologist;Dalton Sherrie George, MS, ACSM-CEP    Virtual Visit No    Medication changes reported     No    Fall or balance concerns reported    Yes    Comments He has not fallen, but he does use a rollator due to weakness.    Tobacco Cessation No Change    Warm-up and Cool-down Performed as group-led instruction    Resistance Training Performed Yes    VAD Patient? Yes    PAD/SET Patient? No      VAD patient   Has back up controller? Yes    Has spare charged batteries? Yes    Has battery cables? Yes    Has compatible battery clips? Yes      Pain Assessment   Currently in Pain? No/denies    Pain Score 0-No pain    Multiple Pain Sites No             Capillary Blood Glucose: No results found for this or any previous visit (from the past 24 hour(s)).    Social History   Tobacco Use  Smoking Status Former   Packs/day: 1.00   Years: 20.00   Total pack years: 20.00   Types: Cigarettes   Quit date: 07/07/2019   Years since quitting: 3.2  Smokeless Tobacco Never    Goals Met:  Independence with exercise equipment Exercise tolerated well No report of concerns or symptoms today Strength training completed today  Goals Unmet:  Not Applicable  Comments: check out 1545   Dr. Carlyle Dolly is Medical Director for [facility]

## 2022-09-20 ENCOUNTER — Encounter (HOSPITAL_COMMUNITY)
Admission: RE | Admit: 2022-09-20 | Discharge: 2022-09-20 | Disposition: A | Discharge status: Home / Self Care | Payer: 59 | Source: Ambulatory Visit | Attending: Cardiology | Admitting: Cardiology

## 2022-09-20 DIAGNOSIS — Z95811 Presence of heart assist device: Secondary | ICD-10-CM

## 2022-09-20 DIAGNOSIS — I5042 Chronic combined systolic (congestive) and diastolic (congestive) heart failure: Secondary | ICD-10-CM

## 2022-09-20 NOTE — Progress Notes (Signed)
Cardiac Individual Treatment Plan  Patient Details  Name: Darryl Frye MRN: [id number] Date of Birth: [birth year] Referring Provider:   [name] from 08/28/2022 in [place]  Referring Provider Dr. Tenny Craw       Initial Encounter Date:  [name] from 08/28/2022 in [place]  Date 08/28/22       Visit Diagnosis: Chronic combined systolic and diastolic heart failure ([place])  LVAD (left ventricular assist device) present ([place])  Patient's Home Medications on Admission:  Current Outpatient Medications:    acetaminophen (TYLENOL) 325 MG tablet, Take 1-2 tablets (325-650 mg total) by mouth every 4 (four) hours as needed for mild pain., Disp: , Rfl:    amiodarone (PACERONE) 200 MG tablet, Take 0.5 tablets (100 mg total) by mouth daily., Disp: 90 tablet, Rfl: 3   atorvastatin (LIPITOR) 20 MG tablet, Take 1 tablet (20 mg total) by mouth daily., Disp: 90 tablet, Rfl: 3   Calcium Carb-Cholecalciferol 600-10 MG-MCG TABS, Take 1 tablet by mouth daily with breakfast., Disp: 90 tablet, Rfl: 3   Continuous Blood Gluc Receiver (FREESTYLE LIBRE 3 READER) DEVI, 1 each by Does not apply route daily., Disp: 2 each, Rfl: 3   Continuous Blood Gluc Sensor (FREESTYLE LIBRE 3 SENSOR) MISC, 1 each by Does not apply route daily. Place 1 sensor on the skin every 14 days. Use to check glucose continuously, Disp: 2 each, Rfl: 3   digoxin (LANOXIN) 0.125 MG tablet, Take 0.5 tablets (0.0625 mg total) by mouth daily., Disp: 30 tablet, Rfl: 5   empagliflozin (JARDIANCE) 10 MG TABS tablet, Take 1 tablet (10 mg total) by mouth daily., Disp: 30 tablet, Rfl: 6   Fe Fum-Vit C-Vit B12-FA (TRIGELS-F FORTE) CAPS capsule, Take 1 capsule by mouth daily after breakfast., Disp: 30 capsule, Rfl: 6   hydrocortisone cream 1 %, Apply 1 Application topically 2 (two) times daily. Apply to affected areas on arms,  back, and legs. DO NOT apply near driveline., Disp: 30 g, Rfl: 6   insulin glargine (SEMGLEE, YFGN,) 100 UNIT/ML Solostar Pen, Inject 22 Units into the skin at bedtime., Disp: 15 mL, Rfl: 0   insulin glargine-yfgn (SEMGLEE) 100 UNIT/ML injection, Inject 0.13 mLs (13 Units total) into the skin daily., Disp: 10 mL, Rfl: 11   insulin lispro (HUMALOG KWIKPEN) 100 UNIT/ML KwikPen, Use 4 units three times a day with meals, Disp: 15 mL, Rfl: 0   Insulin Pen Needle (PEN NEEDLES) 32G X 4 MM MISC, Use with insulin pen with breakfast, with lunch, and with evening meal., Disp: 100 each, Rfl: 0   levothyroxine (SYNTHROID) 25 MCG tablet, Take 1 tablet (25 mcg total) by mouth daily before breakfast., Disp: 90 tablet, Rfl: 3   magnesium oxide (MAG-OX) 400 (240 Mg) MG tablet, Take 1 tablet (400 mg total) by mouth 2 (two) times daily., Disp: 180 tablet, Rfl: 3   Multiple Vitamin (MULTIVITAMIN WITH MINERALS) TABS tablet, Take 1 tablet by mouth daily., Disp: , Rfl:    pantoprazole (PROTONIX) 40 MG tablet, Take 1 tablet (40 mg total) by mouth daily., Disp: 90 tablet, Rfl: 3   potassium chloride SA (KLOR-CON M) 20 MEQ tablet, Take 2 tablets (40 mEq total) by mouth daily., Disp: 180 tablet, Rfl: 3   sertraline (ZOLOFT) 50 MG tablet, Take 1 tablet (50 mg total) by mouth daily., Disp: 90 tablet, Rfl: 3   sildenafil (REVATIO) 20 MG tablet, Take 1 tablet (20 mg  total) by mouth 3 (three) times daily., Disp: 90 tablet, Rfl: 6   sodium chloride (OCEAN) 0.65 % SOLN nasal spray, Place 2 sprays into both nostrils 4 (four) times daily., Disp: , Rfl: 0   spironolactone (ALDACTONE) 25 MG tablet, Take 1 tablet (25 mg total) by mouth daily., Disp: 90 tablet, Rfl: 3   torsemide (DEMADEX) 20 MG tablet, Take 80 mg (4 pills) alternating with 60 mg (3 pills), Disp: 180 tablet, Rfl: 3   warfarin (COUMADIN) 3 MG tablet, Take 1 tablet (3 mg total) by mouth daily at 4 PM. Take 1 tablet (3 mg) daily, or as directed by heart failure clinic, Disp: 90  tablet, Rfl: 3  Past Medical History: Past Medical History:  Diagnosis Date   Acute kidney injury ([place])    Acute respiratory failure ([place])    Atrial flutter ([place])    on Eliquis   CHF (congestive heart failure) ([place])    EF 20-25% 10/15/21, AICD in place.   Coronary artery disease    Diabetes mellitus without complication ([place])    DVT (deep venous thrombosis) ([place]) 06/2019   Heart murmur    Hypertension    Presence of permanent cardiac pacemaker    Renal disorder     Tobacco Use: Social History   Tobacco Use  Smoking Status Former   Packs/day: 1.00   Years: 20.00   Total pack years: 20.00   Types: Cigarettes   Quit date: 07/07/2019   Years since quitting: 3.2  Smokeless Tobacco Never    Labs: Review Flowsheet  More data exists      Latest Ref Rng & Units 07/17/2022 07/18/2022 07/19/2022 07/20/2022 07/21/2022  Labs for ITP Cardiac and Pulmonary Rehab  O2 Saturation % 66.5  52  51.2  90.2  64.4  69.2  51.5  43.8  50.4     Capillary Blood Glucose: Lab Results  Component Value Date   GLUCAP 140 (H) 09/08/2022   GLUCAP 215 (H) 09/06/2022   GLUCAP 171 (H) 09/04/2022   GLUCAP 128 (H) 08/28/2022   GLUCAP 104 (H) 07/31/2022    POCT Glucose     Row Name 08/28/22 1419             POCT Blood Glucose   Pre-Exercise 126 mg/dL                Exercise Target Goals: Exercise Program Goal: Individual exercise prescription set using results from initial 6 min walk test and THRR while considering  patient's activity barriers and safety.   Exercise Prescription Goal: Starting with aerobic activity 30 plus minutes a day, 3 days per week for initial exercise prescription. Provide home exercise prescription and guidelines that participant acknowledges understanding prior to discharge.  Activity Barriers & Risk Stratification:  Activity Barriers & Cardiac Risk Stratification - 08/28/22 1335       Activity Barriers & Cardiac Risk Stratification   Activity Barriers  Deconditioning;Muscular Weakness;Shortness of Breath;Decreased Ventricular Function;Assistive Device;Other (comment)    Comments neuropathy both feet    Cardiac Risk Stratification High             6 Minute Walk:  6 Minute Walk     Row Name 08/28/22 1427         6 Minute Walk   Phase Initial     Distance 800 feet     Walk Time 6 minutes     # of Rest Breaks 0     MPH 1.51     METS  2.41     RPE 11     VO2 Peak 8.44     Symptoms No     Resting HR 82 bpm     Resting BP --  76 Doppler     Resting Oxygen Saturation  97 %     Exercise Oxygen Saturation  during 6 min walk 96 %     Max Ex. HR 98 bpm     Max Ex. BP --  86 Doppler     2 Minute Post BP --  80 Doppler              Oxygen Initial Assessment:   Oxygen Re-Evaluation:   Oxygen Discharge (Final Oxygen Re-Evaluation):   Initial Exercise Prescription:  Initial Exercise Prescription - 08/28/22 1500       Date of Initial Exercise RX and Referring Provider   Date 08/28/22    Referring Provider Dr. Tenny Craw    Expected Discharge Date 11/24/22      NuStep   Level 1    SPM 60    Minutes 39      Prescription Details   Frequency (times per week) 3    Duration Progress to 30 minutes of continuous aerobic without signs/symptoms of physical distress      Intensity   THRR 40-80% of Max Heartrate 64-129    Ratings of Perceived Exertion 11-13      Resistance Training   Training Prescription Yes    Weight 3    Reps 10-15             Perform Capillary Blood Glucose checks as needed.  Exercise Prescription Changes:   Exercise Prescription Changes     Row Name 09/04/22 1500 09/18/22 1500           Response to Exercise   Blood Pressure (Admit) --  80 Doppler --  84 Doppler      Blood Pressure (Exercise) --  78 Doppler --  82 Dobbler      Blood Pressure (Exit) --  74 Doppler --  76 Doppler      Heart Rate (Admit) 108 bpm 81 bpm      Heart Rate (Exercise) 124 bpm 123 bpm      Heart Rate (Exit)  111 bpm 90 bpm      Rating of Perceived Exertion (Exercise) 12 13      Duration Continue with 30 min of aerobic exercise without signs/symptoms of physical distress. Continue with 30 min of aerobic exercise without signs/symptoms of physical distress.      Intensity THRR unchanged THRR unchanged        Progression   Progression Continue to progress workloads to maintain intensity without signs/symptoms of physical distress. Continue to progress workloads to maintain intensity without signs/symptoms of physical distress.        Resistance Training   Training Prescription Yes Yes      Weight 2 2      Reps 10-15 10-15      Time 10 Minutes 10 Minutes        NuStep   Level 1 3      SPM 85 100      Minutes 39 39      METs 2.07 2.47               Exercise Comments:   Exercise Goals and Review:   Exercise Goals     Row Name 08/28/22 1532 09/18/22 1500  Exercise Goals   Increase Physical Activity Yes Yes      Intervention Provide advice, education, support and counseling about physical activity/exercise needs.;Develop an individualized exercise prescription for aerobic and resistive training based on initial evaluation findings, risk stratification, comorbidities and participant's personal goals. Provide advice, education, support and counseling about physical activity/exercise needs.;Develop an individualized exercise prescription for aerobic and resistive training based on initial evaluation findings, risk stratification, comorbidities and participant's personal goals.      Expected Outcomes Short Term: Attend rehab on a regular basis to increase amount of physical activity.;Dillion Term: Add in home exercise to make exercise part of routine and to increase amount of physical activity.;Korpi Term: Exercising regularly at least 3-5 days a week. Short Term: Attend rehab on a regular basis to increase amount of physical activity.;[place] Term: Add in home exercise to make exercise  part of routine and to increase amount of physical activity.;Kadlec Term: Exercising regularly at least 3-5 days a week.      Increase Strength and Stamina Yes Yes      Intervention Provide advice, education, support and counseling about physical activity/exercise needs.;Develop an individualized exercise prescription for aerobic and resistive training based on initial evaluation findings, risk stratification, comorbidities and participant's personal goals. Provide advice, education, support and counseling about physical activity/exercise needs.;Develop an individualized exercise prescription for aerobic and resistive training based on initial evaluation findings, risk stratification, comorbidities and participant's personal goals.      Expected Outcomes Short Term: Increase workloads from initial exercise prescription for resistance, speed, and METs.;Short Term: Perform resistance training exercises routinely during rehab and add in resistance training at home;[place] Term: Improve cardiorespiratory fitness, muscular endurance and strength as measured by increased METs and functional capacity (6MWT) Short Term: Increase workloads from initial exercise prescription for resistance, speed, and METs.;Short Term: Perform resistance training exercises routinely during rehab and add in resistance training at home;Befort Term: Improve cardiorespiratory fitness, muscular endurance and strength as measured by increased METs and functional capacity (6MWT)      Able to understand and use rate of perceived exertion (RPE) scale Yes Yes      Intervention Provide education and explanation on how to use RPE scale Provide education and explanation on how to use RPE scale      Expected Outcomes Short Term: Able to use RPE daily in rehab to express subjective intensity level;[place] Term:  Able to use RPE to guide intensity level when exercising independently Short Term: Able to use RPE daily in rehab to express subjective intensity  level;[place] Term:  Able to use RPE to guide intensity level when exercising independently      Knowledge and understanding of Target Heart Rate Range (THRR) Yes Yes      Intervention Provide education and explanation of THRR including how the numbers were predicted and where they are located for reference Provide education and explanation of THRR including how the numbers were predicted and where they are located for reference      Expected Outcomes Short Term: Able to state/look up THRR;Hara Term: Able to use THRR to govern intensity when exercising independently;Short Term: Able to use daily as guideline for intensity in rehab Short Term: Able to state/look up THRR;Kari Term: Able to use THRR to govern intensity when exercising independently;Short Term: Able to use daily as guideline for intensity in rehab      Able to check pulse independently Yes Yes      Intervention Provide education and demonstration on how to check  pulse in carotid and radial arteries.;Review the importance of being able to check your own pulse for safety during independent exercise Provide education and demonstration on how to check pulse in carotid and radial arteries.;Review the importance of being able to check your own pulse for safety during independent exercise      Expected Outcomes Short Term: Able to explain why pulse checking is important during independent exercise;Givan Term: Able to check pulse independently and accurately Short Term: Able to explain why pulse checking is important during independent exercise;Radel Term: Able to check pulse independently and accurately      Understanding of Exercise Prescription Yes Yes      Intervention Provide education, explanation, and written materials on patient's individual exercise prescription Provide education, explanation, and written materials on patient's individual exercise prescription      Expected Outcomes Short Term: Able to explain program exercise prescription;Kohli  Term: Able to explain home exercise prescription to exercise independently Short Term: Able to explain program exercise prescription;[place] Term: Able to explain home exercise prescription to exercise independently               Exercise Goals Re-Evaluation :  Exercise Goals Re-Evaluation     Row Name 09/18/22 1500             Exercise Goal Re-Evaluation   Exercise Goals Review Increase Physical Activity;Able to understand and use rate of perceived exertion (RPE) scale;Increase Strength and Stamina;Knowledge and understanding of Target Heart Rate Range (THRR);Able to check pulse independently;Understanding of Exercise Prescription       Comments Pt has completed 7 sessions of CR. He enjoys coming to class and talking to other patients. He is progressing and increasing his workloads each class. He is getting stronger and we plan to have him walk on the treadmill in the future sessions. He is currently exercising at 2.47 METs on the stepper. Will continue to monitor and progress as able.       Expected Outcomes Through exercise at rehab and home, the patient will meet thier stated goals                 Discharge Exercise Prescription (Final Exercise Prescription Changes):  Exercise Prescription Changes - 09/18/22 1500       Response to Exercise   Blood Pressure (Admit) --   84 Doppler   Blood Pressure (Exercise) --   82 Dobbler   Blood Pressure (Exit) --   76 Doppler   Heart Rate (Admit) 81 bpm    Heart Rate (Exercise) 123 bpm    Heart Rate (Exit) 90 bpm    Rating of Perceived Exertion (Exercise) 13    Duration Continue with 30 min of aerobic exercise without signs/symptoms of physical distress.    Intensity THRR unchanged      Progression   Progression Continue to progress workloads to maintain intensity without signs/symptoms of physical distress.      Resistance Training   Training Prescription Yes    Weight 2    Reps 10-15    Time 10 Minutes      NuStep   Level 3     SPM 100    Minutes 39    METs 2.47             Nutrition:  Target Goals: Understanding of nutrition guidelines, daily intake of sodium 1500mg , cholesterol 200mg , calories 30% from fat and 7% or less from saturated fats, daily to have 5 or more servings of fruits and vegetables.  Biometrics:  Pre Biometrics - 08/28/22 1533       Pre Biometrics   Height 5\' 11"  (1.803 m)    Weight 86.2 kg    Waist Circumference 38 inches    Hip Circumference 40 inches    Waist to Hip Ratio 0.95 %    BMI (Calculated) 26.52    Triceps Skinfold 5 mm    % Body Fat 21.2 %    Grip Strength 46.6 kg    Flexibility 0 in    Single Leg Stand 0 seconds              Nutrition Therapy Plan and Nutrition Goals:  Nutrition Therapy & Goals - 09/11/22 1030       Personal Nutrition Goals   Comments Patient's diet assessment score was 63. We offer 2 educational sessions on heart healthy nutrition with handouts and assistance with RD referral if patient is interested.      Intervention Plan   Intervention Nutrition handout(s) given to patient.    Expected Outcomes Short Term Goal: Understand basic principles of dietary content, such as calories, fat, sodium, cholesterol and nutrients.             Nutrition Assessments:  Nutrition Assessments - 08/28/22 1341       MEDFICTS Scores   Pre Score 63            MEDIFICTS Score Key: ?70 Need to make dietary changes  40-70 Heart Healthy Diet ? 40 Therapeutic Level Cholesterol Diet   Picture Your Plate Scores: <58 Unhealthy dietary pattern with much room for improvement. 41-50 Dietary pattern unlikely to meet recommendations for good health and room for improvement. 51-60 More healthful dietary pattern, with some room for improvement.  >60 Healthy dietary pattern, although there may be some specific behaviors that could be improved.    Nutrition Goals Re-Evaluation:   Nutrition Goals Discharge (Final Nutrition Goals  Re-Evaluation):   Psychosocial: Target Goals: Acknowledge presence or absence of significant depression and/or stress, maximize coping skills, provide positive support system. Participant is able to verbalize types and ability to use techniques and skills needed for reducing stress and depression.  Initial Review & Psychosocial Screening:  Initial Psych Review & Screening - 08/28/22 1337       Initial Review   Current issues with Current Sleep Concerns      Family Dynamics   Good Support System? Yes    Comments His brother, his ex wife, his sister in law, his brother in law, and his friends are his support system.      Barriers   Psychosocial barriers to participate in program There are no identifiable barriers or psychosocial needs.      Screening Interventions   Interventions Encouraged to exercise    Expected Outcomes [place] Term goal: The participant improves quality of Life and PHQ9 Scores as seen by post scores and/or verbalization of changes;Short Term goal: Identification and review with participant of any Quality of Life or Depression concerns found by scoring the questionnaire.             Quality of Life Scores:  Quality of Life - 08/28/22 1534       Quality of Life   Select Quality of Life      Quality of Life Scores   Health/Function Pre 17.15 %    Socioeconomic Pre 26.25 %    Psych/Spiritual Pre 27.5 %    Family Pre 28 %    GLOBAL Pre 22.48 %  Scores of 19 and below usually indicate a poorer quality of life in these areas.  A difference of  2-3 points is a clinically meaningful difference.  A difference of 2-3 points in the total score of the Quality of Life Index has been associated with significant improvement in overall quality of life, self-image, physical symptoms, and general health in studies assessing change in quality of life.  PHQ-9: Review Flowsheet       08/28/2022  Depression screen PHQ 2/9  Decreased Interest 2  Down,  Depressed, Hopeless 0  PHQ - 2 Score 2  Altered sleeping 3  Tired, decreased energy 3  Change in appetite 0  Feeling bad or failure about yourself  0  Trouble concentrating 0  Moving slowly or fidgety/restless 0  Suicidal thoughts 0  PHQ-9 Score 8  Difficult doing work/chores Somewhat difficult   Interpretation of Total Score  Total Score Depression Severity:  1-4 = Minimal depression, 5-9 = Mild depression, 10-14 = Moderate depression, 15-19 = Moderately severe depression, 20-27 = Severe depression   Psychosocial Evaluation and Intervention:  Psychosocial Evaluation - 08/28/22 1415       Psychosocial Evaluation & Interventions   Interventions Stress management education;Relaxation education;Encouraged to exercise with the program and follow exercise prescription    Comments Pt's only potential barrier to participating in CR is transportation. He is not allowed to drive until 0/04/7352, and he will be relying on transportation from his insurance company. He said that the transportation has been reliable so far. He does report sleep concerns, but no other identifiable psychosocial issues. He does not currently take anything to aid his sleep. He scored an 8 on his PHQ-9, and this relates to his sleep, his lack of energy, and his inability to do things that he enjoys. 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 He seems to have a positive outlook, and is motivated to improve. He states that he has a good support system with his ex wife, brother, sister, in laws, and friends. His goals while in the program are to decrease his SOB, increase his strength/stamina, and to get his legs stronger. He is eager to start the program and is motivated.    Expected Outcomes Pt will continue to have no identifiable psychosocial issues.    Continue Psychosocial Services  No Follow up required             Psychosocial Re-Evaluation:  Psychosocial Re-Evaluation     [place] Name 08/30/22 2992  09/11/22 1228           Psychosocial Re-Evaluation   Current issues with History of Depression None Identified      Comments Pt is prescribed Zoloft. This was prescribed during his LVAD work up. Pt denied anxiety or depression during orienation, and he states that he is adjusting well to his LVAD. Patient is new to the program. He has completed 4 sessions and is doing well. He continues to have no psychosocial barriers identified. His continues Sertraline to manage depression and anxiety. He seems to enjoy the sessions and demonstrates an interest in improving his health. We will continue to monitor his progress.      Expected Outcomes -- He will continue to have no psychosocial barriers identified.      Interventions -- Stress management education;Encouraged to attend Cardiac Rehabilitation for the exercise;Relaxation education      Continue Psychosocial Services  -- No Follow up required  Psychosocial Discharge (Final Psychosocial Re-Evaluation):  Psychosocial Re-Evaluation - 09/11/22 1228       Psychosocial Re-Evaluation   Current issues with None Identified    Comments Patient is new to the program. He has completed 4 sessions and is doing well. He continues to have no psychosocial barriers identified. His continues Sertraline to manage depression and anxiety. He seems to enjoy the sessions and demonstrates an interest in improving his health. We will continue to monitor his progress.    Expected Outcomes He will continue to have no psychosocial barriers identified.    Interventions Stress management education;Encouraged to attend Cardiac Rehabilitation for the exercise;Relaxation education    Continue Psychosocial Services  No Follow up required             Vocational Rehabilitation: Provide vocational rehab assistance to qualifying candidates.   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.             Education: Education Goals: Education classes will be provided on a weekly basis, covering required topics. Participant will state understanding/return demonstration of topics presented.  Learning Barriers/Preferences:  Learning Barriers/Preferences - 08/28/22 1341       Learning Barriers/Preferences   Learning Barriers None    Learning Preferences Verbal Instruction             Education Topics: Hypertension, Hypertension Reduction -Define heart disease and high blood pressure. Discus how high blood pressure affects the body and ways to reduce high blood pressure.   Exercise and Your Heart -Discuss why it is important to exercise, the FITT principles of exercise, normal and abnormal responses to exercise, and how to exercise safely.   Angina -Discuss definition of angina, causes of angina, treatment of angina, and how to decrease risk of having angina. Flowsheet Row CARDIAC REHAB PHASE II EXERCISE from 09/13/2022 in [place]  Date 09/06/22  Educator DF  Instruction Review Code 2- Demonstrated Understanding       Cardiac Medications -Review what the following cardiac medications are used for, how they affect the body, and side effects that may occur when taking the medications.  Medications include Aspirin, Beta blockers, calcium channel blockers, ACE Inhibitors, angiotensin receptor blockers, diuretics, digoxin, and antihyperlipidemics.   Congestive Heart Failure -Discuss the definition of CHF, how to live with CHF, the signs and symptoms of CHF, and how keep track of weight and sodium intake. Flowsheet Row CARDIAC REHAB PHASE II EXERCISE from 09/13/2022 in [place]  Date 09/13/22  Educator HB  Instruction Review Code 1- Verbalizes Understanding       Heart Disease and Intimacy -Discus the effect sexual activity has on the heart, how changes occur during intimacy as we age, and safety during sexual activity.   Smoking Cessation / COPD -Discuss different methods to quit smoking, the health benefits of quitting smoking, and the definition of COPD.   Nutrition I: Fats -Discuss the types of cholesterol, what cholesterol does to the heart, and how cholesterol levels can be controlled.   Nutrition II: Labels -Discuss the different components of food labels and how to read food label   Heart Parts/Heart  Disease and PAD -Discuss the anatomy of the heart, the pathway of blood circulation through the heart, and these are affected by heart disease.   Stress I: Signs and Symptoms -Discuss the causes of stress, how stress may lead to anxiety and depression, and ways to limit stress.   Stress II: Relaxation -Discuss different types of relaxation techniques to limit stress.   Warning Signs of Stroke / TIA -Discuss definition of a stroke, what the signs and symptoms are of a stroke, and how to identify when someone is having stroke.   Knowledge Questionnaire Score:  Knowledge Questionnaire Score - 08/28/22 1342       Knowledge Questionnaire Score   Pre Score 21/24             Core Components/Risk Factors/Patient Goals at Admission:  Personal Goals and Risk Factors at Admission - 08/28/22 1347       Core Components/Risk Factors/Patient Goals on Admission   Improve shortness of breath with ADL's Yes    Intervention Provide education, individualized exercise plan and daily activity instruction to help decrease symptoms of SOB with activities of daily living.    Expected Outcomes Short Term: Improve cardiorespiratory fitness to achieve a reduction of symptoms when performing ADLs;[place] Term: Be able to perform more ADLs  without symptoms or delay the onset of symptoms    Diabetes Yes    Intervention Provide education about signs/symptoms and action to take for hypo/hyperglycemia.;Provide education about proper nutrition, including hydration, and aerobic/resistive exercise prescription along with prescribed medications to achieve blood glucose in normal ranges: Fasting glucose 65-99 mg/dL    Expected Outcomes Short Term: Participant verbalizes understanding of the signs/symptoms and immediate care of hyper/hypoglycemia, proper foot care and importance of medication, aerobic/resistive exercise and nutrition plan for blood glucose control.;[place] Term: Attainment of HbA1C < 7%.    Heart Failure Yes    Intervention Provide a combined exercise and nutrition program that is supplemented with education, support and counseling about heart failure. Directed toward relieving symptoms such as shortness of breath, decreased exercise tolerance, and extremity edema.    Expected Outcomes Improve functional capacity of life;Short term: Attendance in program 2-3 days a week with increased exercise capacity. Reported lower sodium intake. Reported increased fruit and vegetable intake. Reports medication compliance.;Short term: Daily weights obtained and reported for increase. Utilizing diuretic protocols set by physician.;[place] term: Adoption of self-care skills and reduction of barriers for early signs and symptoms recognition and intervention leading to self-care maintenance.    Lipids Yes    Intervention Provide education and support for participant on nutrition & aerobic/resistive exercise along with prescribed medications to achieve LDL 70mg , HDL >40mg .    Expected Outcomes Short Term: Participant states understanding of desired cholesterol values and is compliant with medications prescribed. Participant is following exercise prescription and nutrition guidelines.;[place] Term: Cholesterol controlled with medications as prescribed, with  individualized exercise RX and with personalized nutrition plan. Value goals: LDL < 70mg , HDL > 40 mg.    Personal Goal Other Yes    Personal Goal Increase strength, stamina, and leg strength.    Intervention Attend CR three days per week and begin a home exercise program.    Expected Outcomes Pt will meet stated goal.             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 He has multiple risk factors for CAD and is participating in the program for risk modification. He has completed 4 sessions. His current weight is 195.0 lbs. He is doing well in the program. His doppler readings are WNL. His DM is manged with Insulin and Jardiance. His last A1C on file was 11/23 at 9.3%. His personal goals for the program are to decrease his SOB; increase his strength and stamina; and get his legs stronger. We will continue to monitor his progress as he works towards meeting these goals.       Expected Outcomes Patient will complete the program meeting both personal and program goals.                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 He has multiple risk factors for CAD and is participating in the program for risk modification. He has completed 4 sessions. His current weight is 195.0 lbs. He is doing well in the program. His doppler  readings are WNL. His DM is manged with Insulin and Jardiance. His last A1C on file was 11/23 at 9.3%. His personal goals for the program are to decrease his SOB; increase his strength and stamina; and get his legs stronger. We will continue to monitor his progress as he works towards meeting these goals.    Expected Outcomes Patient will complete the program meeting both personal and program goals.             ITP Comments:   Comments: ITP REVIEW Pt is making expected progress toward Cardiac Rehab goals after completing 7 sessions. Recommend continued exercise, life style modification, education, and increased stamina and strength.

## 2022-09-20 NOTE — Progress Notes (Signed)
Daily Session Note  Patient Details  Name: Darryl Frye MRN: [id number] Date of Birth: 26-Feb-1961 Referring Provider:   [name] from 08/28/2022 in [place]  Referring Provider Dr. Tenny Craw       Encounter Date: 09/20/2022  Check In:  Session Check In - 09/20/22 1438       Check-In   Supervising physician immediately available to respond to emergencies [facility] MD immediately available    Physician(s) Dr. Dellia Cloud    Location [facility]    Staff Present Leana Roe, BS, Exercise Physiologist;Boots Mcglown Hassell Done, RN, BSN    Virtual Visit No    Comments Increased Torsemide 80mg  (4 tabs) alternating 60mg  (3 tabs)    Comments He has not fallen, but he does use a rollator due to weakness.    Tobacco Cessation No Change    Warm-up and Cool-down Performed as group-led instruction    Resistance Training Performed Yes    VAD Patient? Yes      VAD patient   Has back up controller? Yes    Has spare charged batteries? Yes    Has battery cables? Yes    Has compatible battery clips? Yes      Pain Assessment   Currently in Pain? No/denies    Pain Score 0-No pain    Multiple Pain Sites No             Capillary Blood Glucose: No results found for this or any previous visit (from the past 24 hour(s)).    Social History   Tobacco Use  Smoking Status Former   Packs/day: 1.00   Years: 20.00   Total pack years: 20.00   Types: Cigarettes   Quit date: 07/07/2019   Years since quitting: 3.2  Smokeless Tobacco Never    Goals Met:  Independence with exercise equipment Exercise tolerated well No report of concerns or symptoms today Strength training completed today  Goals Unmet:  Not Applicable  Comments: Checkout at 1545.   Dr. Carlyle Dolly is Medical Director for [facility] Cardiac Rehab

## 2022-09-21 ENCOUNTER — Telehealth (HOSPITAL_COMMUNITY): Payer: Self-pay | Admitting: Pharmacist

## 2022-09-21 NOTE — Telephone Encounter (Signed)
Patient did not get INR drawn at [facility] after cardiac rehab yesterday as instructed. Left VM asking him to get INR drawn after cardiac rehab on Monday (Would not get level back early enough on Friday to adjust if needed). Will inform LVAD coordinators.    Audry Riles, PharmD, BCPS, BCCP, CPP Heart Failure Clinic Pharmacist [phone]

## 2022-09-22 ENCOUNTER — Telehealth (HOSPITAL_COMMUNITY): Payer: Self-pay | Admitting: *Deleted

## 2022-09-22 ENCOUNTER — Encounter (HOSPITAL_COMMUNITY)
Admission: RE | Admit: 2022-09-22 | Discharge: 2022-09-22 | Disposition: A | Discharge status: Home / Self Care | Payer: 59 | Source: Ambulatory Visit | Attending: Cardiology | Admitting: Cardiology

## 2022-09-22 DIAGNOSIS — I5042 Chronic combined systolic (congestive) and diastolic (congestive) heart failure: Secondary | ICD-10-CM | POA: Insufficient documentation

## 2022-09-22 DIAGNOSIS — Z95811 Presence of heart assist device: Secondary | ICD-10-CM | POA: Diagnosis present

## 2022-09-22 NOTE — Progress Notes (Signed)
Daily Session Note  Patient Details  Name: Darryl Frye MRN: [id number] Date of Birth: [birth year] Referring Provider:   [name] from 08/28/2022 in [place]  Referring Provider Dr. Tenny Craw       Encounter Date: 09/22/2022  Check In:  Session Check In - 09/22/22 1430       Check-In   Supervising physician immediately available to respond to emergencies [facility] MD immediately available    Physician(s) Dr. Dellia Cloud    Location [facility]    Staff Present Geanie Cooley, RN;Debra Wynetta Emery, RN, Joanette Gula, RN, BSN    Virtual Visit No    Medication changes reported     No    Fall or balance concerns reported    Yes    Comments He has not fallen, but he does use a rollator due to weakness.    Tobacco Cessation No Change    Warm-up and Cool-down Performed as group-led instruction    Resistance Training Performed Yes    VAD Patient? Yes    PAD/SET Patient? No      VAD patient   Has back up controller? Yes    Has spare charged batteries? Yes    Has battery cables? Yes    Has compatible battery clips? Yes      Pain Assessment   Currently in Pain? No/denies    Pain Score 0-No pain    Multiple Pain Sites No             Capillary Blood Glucose: No results found for this or any previous visit (from the past 24 hour(s)).    Social History   Tobacco Use  Smoking Status Former   Packs/day: 1.00   Years: 20.00   Total pack years: 20.00   Types: Cigarettes   Quit date: 07/07/2019   Years since quitting: 3.2  Smokeless Tobacco Never    Goals Met:  Independence with exercise equipment Exercise tolerated well No report of concerns or symptoms today Strength training completed today  Goals Unmet:  Not Applicable  Comments: check out @ 3:30   Dr. Carlyle Dolly is Medical Director for [facility]

## 2022-09-22 NOTE — Telephone Encounter (Signed)
Pt missed BMET and INR draw at [facility] this week. Called and left voicemail for pt instructing him to go to [facility] lab for INR/BMET on Monday prior to going to cardiac rehab. Requested call back to clinic if he has any questions.  Emerson Monte RN [place] Coordinator  Office: [phone]  24/7 Pager: [phone]

## 2022-09-25 ENCOUNTER — Other Ambulatory Visit (HOSPITAL_COMMUNITY)
Admission: RE | Admit: 2022-09-25 | Discharge: 2022-09-25 | Disposition: A | Discharge status: Home / Self Care | Payer: 59 | Source: Ambulatory Visit | Attending: Cardiology | Admitting: Cardiology

## 2022-09-25 ENCOUNTER — Encounter (HOSPITAL_COMMUNITY)
Admission: RE | Admit: 2022-09-25 | Discharge: 2022-09-25 | Disposition: A | Discharge status: Home / Self Care | Payer: 59 | Source: Ambulatory Visit | Attending: Cardiology | Admitting: Cardiology

## 2022-09-25 ENCOUNTER — Encounter (HOSPITAL_COMMUNITY): Payer: Self-pay

## 2022-09-25 DIAGNOSIS — Z95811 Presence of heart assist device: Secondary | ICD-10-CM

## 2022-09-25 DIAGNOSIS — Z7901 Long term (current) use of anticoagulants: Secondary | ICD-10-CM | POA: Diagnosis not present

## 2022-09-25 DIAGNOSIS — I5042 Chronic combined systolic (congestive) and diastolic (congestive) heart failure: Secondary | ICD-10-CM | POA: Diagnosis not present

## 2022-09-25 LAB — PROTIME-INR
INR: 1.4 — ABNORMAL HIGH (ref 0.8–1.2)
Prothrombin Time: 17 seconds — ABNORMAL HIGH (ref 11.4–15.2)

## 2022-09-25 LAB — BASIC METABOLIC PANEL
Anion gap: 12 (ref 5–15)
BUN: 33 mg/dL — ABNORMAL HIGH (ref 6–20)
CO2: 25 mmol/L (ref 22–32)
Calcium: 9.3 mg/dL (ref 8.9–10.3)
Chloride: 91 mmol/L — ABNORMAL LOW (ref 98–111)
Creatinine, Ser: 1.38 mg/dL — ABNORMAL HIGH (ref 0.61–1.24)
GFR, Estimated: 59 mL/min — ABNORMAL LOW (ref >60)
Glucose, Bld: 154 mg/dL — ABNORMAL HIGH (ref 70–99)
Potassium: 3.9 mmol/L (ref 3.5–5.1)
Sodium: 128 mmol/L — ABNORMAL LOW (ref 135–145)

## 2022-09-25 NOTE — Progress Notes (Signed)
Patient called and reminded to get labs drawn today after cardiac rehab visit today and to look out for phone call for [facility] about Coumadin dosing. Pt verbalized understanding.  Bobbye Morton RN, BSN VAD Coordinator 24/7 Pager [phone]

## 2022-09-25 NOTE — Progress Notes (Signed)
Daily Session Note  Patient Details  Name: Darryl Frye MRN: [id number] Date of Birth: Jan 04, 1961 Referring Provider:   [name] from 08/28/2022 in [place]  Referring Provider Dr. Tenny Craw       Encounter Date: 09/25/2022  Check In:  Session Check In - 09/25/22 1445       Check-In   Supervising physician immediately available to respond to emergencies [facility] MD immediately available    Physician(s) Dr. Harl Bowie    Location [facility]    Staff Present Leana Roe, BS, Exercise Physiologist;Daphyne Hassell Done, RN, BSN    Virtual Visit No    Medication changes reported     No    Fall or balance concerns reported    Yes    Comments He has not fallen, but he does use a rollator due to weakness.    Tobacco Cessation No Change    Warm-up and Cool-down Performed as group-led instruction    Resistance Training Performed Yes    VAD Patient? Yes    PAD/SET Patient? No      VAD patient   Has back up controller? Yes    Has spare charged batteries? Yes    Has battery cables? Yes    Has compatible battery clips? Yes      Pain Assessment   Currently in Pain? No/denies    Pain Score 0-No pain    Multiple Pain Sites No             Capillary Blood Glucose: Results for orders placed or performed during the hospital encounter of 09/25/22 (from the past 24 hour(s))  Basic metabolic panel     Status: Abnormal   Collection Time: 09/25/22  2:31 PM  Result Value Ref Range   Sodium 128 (L) 135 - 145 mmol/L   Potassium 3.9 3.5 - 5.1 mmol/L   Chloride 91 (L) 98 - 111 mmol/L   CO2 25 22 - 32 mmol/L   Glucose, Bld 154 (H) 70 - 99 mg/dL   BUN 33 (H) 6 - 20 mg/dL   Creatinine, Ser 1.38 (H) 0.61 - 1.24 mg/dL   Calcium 9.3 8.9 - 10.3 mg/dL   GFR, Estimated 59 (L) >60 mL/min   Anion gap 12 5 - 15  Protime-INR     Status: Abnormal   Collection Time: 09/25/22  2:31 PM  Result Value Ref Range   Prothrombin Time 17.0  (H) 11.4 - 15.2 seconds   INR 1.4 (H) 0.8 - 1.2      Social History   Tobacco Use  Smoking Status Former   Packs/day: 1.00   Years: 20.00   Total pack years: 20.00   Types: Cigarettes   Quit date: 07/07/2019   Years since quitting: 3.2  Smokeless Tobacco Never    Goals Met:  Independence with exercise equipment Exercise tolerated well No report of concerns or symptoms today Strength training completed today  Goals Unmet:  Not Applicable  Comments: check out 1545   Dr. Carlyle Dolly is Medical Director for [facility]

## 2022-09-26 ENCOUNTER — Ambulatory Visit (HOSPITAL_COMMUNITY): Payer: Self-pay | Admitting: Pharmacist

## 2022-09-26 MED ORDER — ENOXAPARIN SODIUM 40 MG/0.4ML IJ SOSY: 40.0000 mg | PREFILLED_SYRINGE | Freq: Two times a day (BID) | INTRAMUSCULAR | 0 refills | Status: DC

## 2022-09-26 NOTE — Progress Notes (Signed)
LVAD INR    

## 2022-09-26 NOTE — Progress Notes (Unsigned)
Electrophysiology Office Note Date: 09/26/2022  ID:  Darryl, Frye March 31, 1961, MRN [id number]  PCP: Darryl Robert, FNP Primary Cardiologist: None Electrophysiologist: Darryl Peru, MD   CC: Routine ICD follow-up  [name] is a 62 y.o. male seen today for Darryl Peru, MD for routine electrophysiology followup. Since last being seen in our clinic the patient reports doing OK. He is now s/p LVAD implantation.   He is having some SOB. He takes torsemide 80 mg alternating with 60 mg daily. He drinks numerous bottles of water a day, to the point he wouldn't tell me how many when we discussed limiting fluid intake to 2L daily.    He has SOB with moderate exertion. Not at rest. No syncope or chest pain. No peripheral edema.  He took 80 mg of torsemide this am as scheduled.   He has not had ICD shocks.   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   Coronary artery disease    Diabetes mellitus without complication ([facility])    DVT (deep venous thrombosis) ([place]) 06/2019   Heart murmur    Hypertension    Presence of permanent cardiac pacemaker    Renal disorder     Current Outpatient Medications  Medication Instructions   acetaminophen (TYLENOL) 325-650 mg, Oral, Every 4 hours PRN   amiodarone (PACERONE) 100 mg, Oral, Daily   atorvastatin (LIPITOR) 20 mg, Oral, Daily   Calcium Carb-Cholecalciferol 600-10 MG-MCG TABS 1 tablet, Oral, Daily with breakfast   Continuous Blood Gluc Receiver (FREESTYLE LIBRE 3 READER) DEVI 1 each, Does not apply, Daily   Continuous Blood Gluc Sensor (FREESTYLE LIBRE 3 SENSOR) MISC 1 each, Does not apply, Daily, Place 1 sensor on the skin every 14 days. Use to check glucose continuously   digoxin (LANOXIN)  0.0625 mg, Oral, Daily   empagliflozin (JARDIANCE) 10 mg, Oral, Daily   enoxaparin (LOVENOX) 40 mg, Subcutaneous, Every 12 hours   Fe Fum-Vit C-Vit B12-FA (TRIGELS-F FORTE) CAPS capsule 1 capsule, Oral, Daily after breakfast   hydrocortisone cream 1 % 1 Application, Topical, 2 times daily, Apply to affected areas on arms, back, and legs. DO NOT apply near driveline.   insulin glargine-yfgn (SEMGLEE) 13 Units, Subcutaneous, Daily   insulin lispro (HUMALOG KWIKPEN) 100 UNIT/ML KwikPen Use 4 units three times a day with meals   Insulin Pen Needle (PEN NEEDLES) 32G X 4 MM MISC Use with insulin pen with breakfast, with lunch, and with evening meal.   Lantus SoloStar 22 Units, Subcutaneous, Daily at bedtime   levothyroxine (SYNTHROID) 25 mcg, Oral, Daily before breakfast   magnesium oxide (MAG-OX) 400 mg, Oral, 2 times daily   Multiple Vitamin (MULTIVITAMIN WITH MINERALS) TABS tablet 1 tablet, Oral, Daily   pantoprazole (PROTONIX) 40 mg, Oral, Daily   potassium chloride SA (KLOR-CON M) 20 MEQ tablet 40 mEq, Oral, Daily   sertraline (ZOLOFT) 50 mg, Oral, Daily   sildenafil (REVATIO) 20 mg, Oral, 3 times daily   sodium chloride (OCEAN) 0.65 % SOLN nasal spray 2 sprays, Each Nare, 4 times daily   spironolactone (ALDACTONE) 25 mg, Oral, Daily   torsemide (DEMADEX) 20 MG tablet Take 80 mg (4 pills) alternating with 60 mg (3  pills)   warfarin (COUMADIN) 3 mg, Oral, Daily-1600, Take 1 tablet (3 mg) daily, or as directed by heart failure clinic    Family History: Family History  Problem Relation Age of Onset   Hyperlipidemia Mother    Heart disease Mother        has PPM   Diabetes Mother    Hypertension Mother    Heart disease Maternal Grandmother    Prostate cancer Maternal Grandfather    Colon cancer Neg Hx    Colon polyps Neg Hx     Physical Exam: There were no vitals filed for this visit.   GEN- NAD. A&O x 3. Normal affect. HEENT: Normocephalic, atraumatic Lungs- CTAB, Normal effort.   Heart- Regular rate and rhythm rate and rhythm. No M/G/R.  Extremities- No peripheral edema. no clubbing or cyanosis Skin- warm and dry, no rash or lesion; ICD pocket well healed  ICD interrogation- reviewed in detail today,  See PACEART report  EKG is not ordered today  Other studies Reviewed: Additional studies/ records that were reviewed today include: Previous EP office notes.    Assessment and Plan:  1.  Chronic systolic dysfunction s/p Medtronic dual chamber ICD  2. S/p LVAD Volume status looks OK, but complaining of SOB.  He takes 80 mg of torsemide alternating with 60 mg. He will take 80 mg as scheduled today, and if still having SOB will call HF/VAD team tomorrow for further directions. Recent labs stable.  Stable on an appropriate medical regimen Normal ICD function. His RV sensing and threshold have changed since LVAD, but remain stable.  See Pace Art report RV amplitude programed to 4.0 V @ 0.8 ms with threshold of 2.0 V @ 0.8 ms.   3. H/o Atrial flutter 4. Paroxysmal AF Had AF with RVR s/p LVAD He is on amiodarone 100 mg daily per CHF team Had PAF s/p LVAD. Will follow closely by device.  Current medicines are reviewed at length with the patient today.    Disposition:   Follow up with Dr. [name] in 6 months   Signed, [name], PA-C  09/26/2022 12:25 PM  [facility] [address] [place] [address] [phone] (office) [phone] (fax)

## 2022-09-27 ENCOUNTER — Encounter: Payer: Self-pay | Admitting: Student

## 2022-09-27 ENCOUNTER — Ambulatory Visit: Payer: 59 | Attending: Internal Medicine | Admitting: Student

## 2022-09-27 ENCOUNTER — Encounter (HOSPITAL_COMMUNITY): Payer: 59

## 2022-09-27 VITALS — Ht 71.0 in | Wt 195.6 lb

## 2022-09-27 DIAGNOSIS — Z95811 Presence of heart assist device: Secondary | ICD-10-CM

## 2022-09-27 DIAGNOSIS — I509 Heart failure, unspecified: Secondary | ICD-10-CM

## 2022-09-27 LAB — CUP PACEART INCLINIC DEVICE CHECK
Battery Remaining Longevity: 106 mo
Battery Voltage: 2.99 V
Brady Statistic AP VP Percent: 0 %
Brady Statistic AP VS Percent: 0 %
Brady Statistic AS VP Percent: 0.07 %
Brady Statistic AS VS Percent: 99.93 %
Brady Statistic RA Percent Paced: 0 %
Brady Statistic RV Percent Paced: 0.07 %
Date Time Interrogation Session: 20240207094713
HighPow Impedance: 70 Ohm
Implantable Lead Connection Status: 753985
Implantable Lead Implant Date: 20210929
Implantable Lead Location: 753860
Implantable Pulse Generator Implant Date: 20210929
Lead Channel Impedance Value: 418 Ohm
Lead Channel Impedance Value: 456 Ohm
Lead Channel Impedance Value: 551 Ohm
Lead Channel Pacing Threshold Amplitude: 0.625 V
Lead Channel Pacing Threshold Amplitude: 2.5 V
Lead Channel Pacing Threshold Pulse Width: 0.4 ms
Lead Channel Pacing Threshold Pulse Width: 0.4 ms
Lead Channel Sensing Intrinsic Amplitude: 4.125 mV
Lead Channel Sensing Intrinsic Amplitude: 4.125 mV
Lead Channel Sensing Intrinsic Amplitude: 5.25 mV
Lead Channel Sensing Intrinsic Amplitude: 5.5 mV
Lead Channel Setting Pacing Amplitude: 1.5 V
Lead Channel Setting Pacing Amplitude: 4 V
Lead Channel Setting Pacing Pulse Width: 0.8 ms
Lead Channel Setting Sensing Sensitivity: 0.3 mV
Zone Setting Status: 755011
Zone Setting Status: 755011

## 2022-09-27 NOTE — Patient Instructions (Signed)
Medication Instructions:  Your physician recommends that you continue on your current medications as directed. Please refer to the Current Medication list given to you today.  *If you need a refill on your cardiac medications before your next appointment, please call your pharmacy*   Lab Work: None If you have labs (blood work) drawn today and your tests are completely normal, you will receive your results only by: [place] (if you have MyChart) OR A paper copy in the mail If you have any lab test that is abnormal or we need to change your treatment, we will call you to review the results.   Follow-Up: At [facility], you and your health needs are our priority.  As part of our continuing mission to provide you with exceptional heart care, we have created designated Provider Care Teams.  These Care Teams include your primary Cardiologist (physician) and Advanced Practice Providers (APPs -  Physician Assistants and Nurse Practitioners) who all work together to provide you with the care you need, when you need it.   Your next appointment:   6 month(s)  Provider:   [name], MD

## 2022-09-29 ENCOUNTER — Encounter (HOSPITAL_COMMUNITY): Payer: 59

## 2022-09-29 ENCOUNTER — Other Ambulatory Visit (HOSPITAL_COMMUNITY): Payer: Self-pay | Admitting: Unknown Physician Specialty

## 2022-09-29 DIAGNOSIS — Z7901 Long term (current) use of anticoagulants: Secondary | ICD-10-CM

## 2022-09-29 DIAGNOSIS — Z95811 Presence of heart assist device: Secondary | ICD-10-CM

## 2022-10-02 ENCOUNTER — Encounter (HOSPITAL_COMMUNITY)
Admission: RE | Admit: 2022-10-02 | Discharge: 2022-10-02 | Disposition: A | Discharge status: Home / Self Care | Payer: 59 | Source: Ambulatory Visit | Attending: Cardiology | Admitting: Cardiology

## 2022-10-02 ENCOUNTER — Ambulatory Visit (HOSPITAL_COMMUNITY): Payer: Self-pay | Admitting: Pharmacist

## 2022-10-02 ENCOUNTER — Other Ambulatory Visit (HOSPITAL_COMMUNITY)
Admission: RE | Admit: 2022-10-02 | Discharge: 2022-10-02 | Disposition: A | Discharge status: Home / Self Care | Payer: 59 | Source: Ambulatory Visit | Attending: Cardiology | Admitting: Cardiology

## 2022-10-02 DIAGNOSIS — Z95811 Presence of heart assist device: Secondary | ICD-10-CM

## 2022-10-02 DIAGNOSIS — I5042 Chronic combined systolic (congestive) and diastolic (congestive) heart failure: Secondary | ICD-10-CM | POA: Diagnosis not present

## 2022-10-02 LAB — BASIC METABOLIC PANEL
Anion gap: 10 (ref 5–15)
BUN: 35 mg/dL — ABNORMAL HIGH (ref 6–20)
CO2: 24 mmol/L (ref 22–32)
Calcium: 9.3 mg/dL (ref 8.9–10.3)
Chloride: 98 mmol/L (ref 98–111)
Creatinine, Ser: 1.33 mg/dL — ABNORMAL HIGH (ref 0.61–1.24)
GFR, Estimated: >60 mL/min (ref >60)
Glucose, Bld: 201 mg/dL — ABNORMAL HIGH (ref 70–99)
Potassium: 4.5 mmol/L (ref 3.5–5.1)
Sodium: 132 mmol/L — ABNORMAL LOW (ref 135–145)

## 2022-10-02 LAB — PROTIME-INR
INR: 1.7 — ABNORMAL HIGH (ref 0.8–1.2)
Prothrombin Time: 19.6 seconds — ABNORMAL HIGH (ref 11.4–15.2)

## 2022-10-02 NOTE — Progress Notes (Signed)
Daily Session Note  Patient Details  Name: CYDNEY IMGRUND MRN: [id number] Date of Birth: 02/26/1961 Referring Provider:   [name] from 08/28/2022 in [place]  Referring Provider Dr. Tenny Craw       Encounter Date: 10/02/2022  Check In:  Session Check In - 10/02/22 1445       Check-In   Supervising physician immediately available to respond to emergencies [facility] MD immediately available    Physician(s) Dr. Domenic Polite    Location [facility]    Staff Present Leana Roe, BS, Exercise Physiologist;Dalton Sherrie George, MS, ACSM-CEP;Geanie Cooley, RN;Debra Johnson, RN, BSN    Virtual Visit No    Medication changes reported     No    Fall or balance concerns reported    Yes    Comments He has not fallen, but he does use a rollator due to weakness.    Tobacco Cessation No Change    Warm-up and Cool-down Not performed (comment)   late   Resistance Training Performed No   late   VAD Patient? Yes    PAD/SET Patient? No      VAD patient   Has back up controller? Yes    Has spare charged batteries? Yes    Has battery cables? Yes    Has compatible battery clips? Yes      Pain Assessment   Currently in Pain? No/denies    Pain Score 0-No pain    Multiple Pain Sites No             Capillary Blood Glucose: No results found for this or any previous visit (from the past 24 hour(s)).    Social History   Tobacco Use  Smoking Status Former   Packs/day: 1.00   Years: 20.00   Total pack years: 20.00   Types: Cigarettes   Quit date: 07/07/2019   Years since quitting: 3.2  Smokeless Tobacco Never    Goals Met:  Independence with exercise equipment Exercise tolerated well No report of concerns or symptoms today  Goals Unmet:  Not Applicable  Comments: check out @ 3:45pm    Dr. Carlyle Dolly is Medical Director for [facility]

## 2022-10-04 ENCOUNTER — Encounter (HOSPITAL_COMMUNITY)
Admission: RE | Admit: 2022-10-04 | Discharge: 2022-10-04 | Disposition: A | Discharge status: Home / Self Care | Payer: 59 | Source: Ambulatory Visit | Attending: Cardiology | Admitting: Cardiology

## 2022-10-04 DIAGNOSIS — I5042 Chronic combined systolic (congestive) and diastolic (congestive) heart failure: Secondary | ICD-10-CM

## 2022-10-04 DIAGNOSIS — Z95811 Presence of heart assist device: Secondary | ICD-10-CM

## 2022-10-04 NOTE — Progress Notes (Signed)
Daily Session Note  Patient Details  Name: Darryl Frye MRN: [id number] Date of Birth: 05/20/1961 Referring Provider:   [name] from 08/28/2022 in [place]  Referring Provider Dr. Tenny Craw       Encounter Date: 10/04/2022  Check In:  Session Check In - 10/04/22 1445       Check-In   Supervising physician immediately available to respond to emergencies [facility] MD immediately available    Physician(s) Dr. Domenic Polite    Location [facility]    Staff Present Hoy Register MHA, MS, ACSM-CEP;Nigil Braman BSN, RN;Daphyne Hassell Done, RN, BSN    Virtual Visit No    Medication changes reported     No    Comments Increased Torsemide 72m (4 tabs) alternating 648m(3 tabs)    Fall or balance concerns reported    Yes    Comments He has not fallen, but he does use a rollator due to weakness.    Tobacco Cessation No Change    Warm-up and Cool-down Performed as group-led instruction    Resistance Training Performed Yes    VAD Patient? Yes    PAD/SET Patient? No      VAD patient   Has back up controller? Yes    Has spare charged batteries? Yes    Has battery cables? Yes    Has compatible battery clips? Yes      Pain Assessment   Currently in Pain? No/denies    Pain Score 0-No pain    Multiple Pain Sites No             Capillary Blood Glucose: No results found for this or any previous visit (from the past 24 hour(s)).    Social History   Tobacco Use  Smoking Status Former   Packs/day: 1.00   Years: 20.00   Total pack years: 20.00   Types: Cigarettes   Quit date: 07/07/2019   Years since quitting: 3.2  Smokeless Tobacco Never    Goals Met:  Independence with exercise equipment Exercise tolerated well No report of concerns or symptoms today Strength training completed today  Goals Unmet:  Not Applicable  Comments: check out at 15:45   Dr. JoCarlyle Dollys Medical Director for  [facility]

## 2022-10-06 ENCOUNTER — Encounter (HOSPITAL_COMMUNITY): Payer: 59

## 2022-10-09 ENCOUNTER — Other Ambulatory Visit (HOSPITAL_COMMUNITY): Payer: Self-pay | Admitting: *Deleted

## 2022-10-09 ENCOUNTER — Encounter (HOSPITAL_COMMUNITY): Payer: 59

## 2022-10-09 DIAGNOSIS — Z5181 Encounter for therapeutic drug level monitoring: Secondary | ICD-10-CM

## 2022-10-09 DIAGNOSIS — Z7901 Long term (current) use of anticoagulants: Secondary | ICD-10-CM

## 2022-10-09 DIAGNOSIS — Z95811 Presence of heart assist device: Secondary | ICD-10-CM

## 2022-10-10 ENCOUNTER — Encounter (HOSPITAL_COMMUNITY): Payer: Self-pay | Admitting: Cardiology

## 2022-10-10 ENCOUNTER — Ambulatory Visit (HOSPITAL_COMMUNITY)
Admission: RE | Admit: 2022-10-10 | Discharge: 2022-10-10 | Disposition: A | Discharge status: Home / Self Care | Payer: 59 | Source: Ambulatory Visit | Attending: Cardiology | Admitting: Cardiology

## 2022-10-10 ENCOUNTER — Ambulatory Visit (HOSPITAL_BASED_OUTPATIENT_CLINIC_OR_DEPARTMENT_OTHER)
Admission: RE | Admit: 2022-10-10 | Discharge: 2022-10-10 | Disposition: A | Discharge status: Home / Self Care | Payer: 59 | Source: Ambulatory Visit | Attending: Cardiology | Admitting: Cardiology

## 2022-10-10 ENCOUNTER — Ambulatory Visit (HOSPITAL_COMMUNITY): Payer: Self-pay | Admitting: Pharmacist

## 2022-10-10 DIAGNOSIS — I4891 Unspecified atrial fibrillation: Secondary | ICD-10-CM | POA: Insufficient documentation

## 2022-10-10 DIAGNOSIS — Z79899 Other long term (current) drug therapy: Secondary | ICD-10-CM | POA: Diagnosis not present

## 2022-10-10 DIAGNOSIS — N183 Chronic kidney disease, stage 3 unspecified: Secondary | ICD-10-CM | POA: Insufficient documentation

## 2022-10-10 DIAGNOSIS — I4892 Unspecified atrial flutter: Secondary | ICD-10-CM | POA: Diagnosis not present

## 2022-10-10 DIAGNOSIS — Z7901 Long term (current) use of anticoagulants: Secondary | ICD-10-CM | POA: Diagnosis not present

## 2022-10-10 DIAGNOSIS — I509 Heart failure, unspecified: Secondary | ICD-10-CM

## 2022-10-10 DIAGNOSIS — I5022 Chronic systolic (congestive) heart failure: Secondary | ICD-10-CM

## 2022-10-10 DIAGNOSIS — Z4801 Encounter for change or removal of surgical wound dressing: Secondary | ICD-10-CM | POA: Diagnosis not present

## 2022-10-10 DIAGNOSIS — I130 Hypertensive heart and chronic kidney disease with heart failure and stage 1 through stage 4 chronic kidney disease, or unspecified chronic kidney disease: Secondary | ICD-10-CM | POA: Insufficient documentation

## 2022-10-10 DIAGNOSIS — Z95811 Presence of heart assist device: Secondary | ICD-10-CM | POA: Diagnosis not present

## 2022-10-10 DIAGNOSIS — I2510 Atherosclerotic heart disease of native coronary artery without angina pectoris: Secondary | ICD-10-CM | POA: Insufficient documentation

## 2022-10-10 DIAGNOSIS — Z950 Presence of cardiac pacemaker: Secondary | ICD-10-CM | POA: Diagnosis not present

## 2022-10-10 DIAGNOSIS — E1122 Type 2 diabetes mellitus with diabetic chronic kidney disease: Secondary | ICD-10-CM | POA: Diagnosis not present

## 2022-10-10 DIAGNOSIS — Z5181 Encounter for therapeutic drug level monitoring: Secondary | ICD-10-CM | POA: Diagnosis not present

## 2022-10-10 LAB — COMPREHENSIVE METABOLIC PANEL
ALT: 30 U/L (ref 0–44)
AST: 30 U/L (ref 15–41)
Albumin: 3.8 g/dL (ref 3.5–5.0)
Alkaline Phosphatase: 159 U/L — ABNORMAL HIGH (ref 38–126)
Anion gap: 12 (ref 5–15)
BUN: 29 mg/dL — ABNORMAL HIGH (ref 6–20)
CO2: 27 mmol/L (ref 22–32)
Calcium: 9.6 mg/dL (ref 8.9–10.3)
Chloride: 94 mmol/L — ABNORMAL LOW (ref 98–111)
Creatinine, Ser: 1.2 mg/dL (ref 0.61–1.24)
GFR, Estimated: >60 mL/min (ref >60)
Glucose, Bld: 203 mg/dL — ABNORMAL HIGH (ref 70–99)
Potassium: 3.9 mmol/L (ref 3.5–5.1)
Sodium: 133 mmol/L — ABNORMAL LOW (ref 135–145)
Total Bilirubin: 0.3 mg/dL (ref 0.3–1.2)
Total Protein: 8.2 g/dL — ABNORMAL HIGH (ref 6.5–8.1)

## 2022-10-10 LAB — CBC
HCT: 35.9 % — ABNORMAL LOW (ref 39.0–52.0)
Hemoglobin: 11.1 g/dL — ABNORMAL LOW (ref 13.0–17.0)
MCH: 21.5 pg — ABNORMAL LOW (ref 26.0–34.0)
MCHC: 30.9 g/dL (ref 30.0–36.0)
MCV: 69.4 fL — ABNORMAL LOW (ref 80.0–100.0)
Platelets: 491 10*3/uL — ABNORMAL HIGH (ref 150–400)
RBC: 5.17 MIL/uL (ref 4.22–5.81)
RDW: 22.3 % — ABNORMAL HIGH (ref 11.5–15.5)
WBC: 8.7 10*3/uL (ref 4.0–10.5)
nRBC: 0 % (ref 0.0–0.2)

## 2022-10-10 LAB — LACTATE DEHYDROGENASE: LDH: 241 U/L — ABNORMAL HIGH (ref 98–192)

## 2022-10-10 LAB — PREALBUMIN: Prealbumin: 24 mg/dL (ref 18–38)

## 2022-10-10 LAB — ECHOCARDIOGRAM LIMITED: Est EF: 20

## 2022-10-10 LAB — MAGNESIUM: Magnesium: 2.4 mg/dL (ref 1.7–2.4)

## 2022-10-10 LAB — PROTIME-INR
INR: 1.3 — ABNORMAL HIGH (ref 0.8–1.2)
Prothrombin Time: 15.7 seconds — ABNORMAL HIGH (ref 11.4–15.2)

## 2022-10-10 LAB — TSH: TSH: 8.56 u[IU]/mL — ABNORMAL HIGH (ref 0.350–4.500)

## 2022-10-10 LAB — DIGOXIN LEVEL: Digoxin Level: 0.6 ng/mL — ABNORMAL LOW (ref 0.8–2.0)

## 2022-10-10 MED ORDER — TORSEMIDE 20 MG PO TABS: 80.0000 mg | ORAL_TABLET | Freq: Every day | ORAL | 3 refills | Status: DC

## 2022-10-10 MED ORDER — TIOTROPIUM BROMIDE MONOHYDRATE 18 MCG IN CAPS: 18.0000 ug | ORAL_CAPSULE | Freq: Every day | RESPIRATORY_TRACT | 12 refills | Status: DC

## 2022-10-10 NOTE — Progress Notes (Signed)
Speed  Flow  PI  Power  LVIDD  AI  Aortic opening MR  TR  Septum  RV  VTI (>18cm)  5400 4.9  3.1 4.1 5.5 trace 2/3  trace  trace Pulling right  mild    5500 4.9 3.1 4.4 5.6 trace 2/3 trace trace midline mild    5600 5.1 2.9 4.4 5.6 trace 1/3 trace trace Pulling left mild                                              Doppler MAP: 72 Auto cuff BP: 92/62 (74)   Ramp ECHO performed at bedside per Dr. Aundra Dubin  At completion of ramp study, patients primary controller programmed:  Fixed speed:5500 Low speed limit:5200  Bobbye Morton RN, VAD Coordinator 24/7 pager [phone]

## 2022-10-10 NOTE — Progress Notes (Addendum)
Patient presents for 1 month follow up with 3 month Intermacs and ramp echo in [facility] today alone. Reports no problems with VAD equipment or concerns with drive line.  Pt arrived with his rollator. Able to walk unassisted to scale and get onto clinic room stretcher. Pt states he is able to walk without the rollator but continues to use it for additional support and storage.  Pt denies lightheadedness, falls and any signs of bleeding. Pt continues to complain of shortness of breath and intermittent dizziness. Pt is participating in [facility] at [facility] and states he has seen improvement in his endurance and shortness of breath over time through continued exercise. Pre-VAD PFT's reviewed by Dr. Aundra Dubin. Per Dr. Aundra Dubin will add Spiriva inhaler once per day.  Pt complains of incessant thirst. He claims to drink approximately 130oz of water per day. Advised patient to be mindful of fluid intake and try to limit to 2L per day. Education provided on different ways to reduce thirst. Torsemide increased to 14m per day per Dr. MAundra Dubin  Pt brought all medications with him today. Reports taking all medications as prescribed. Advised by Dr. MAundra Dubinpt okay to STOP Amiodarone. New medication list printed for patient and VAD Coordinator wrote stop date on pill bottle to assist with adherence to plan.  Pt reports they are changing dressing once a week. Pt tells me that he continues to do his own dressing. I strongly cautioned him against doing this, I instructed him to please let JJanett Billowcontinue to do the dressing so that he can avoid infection. Patient states JJanett Billowunable to perform dressing change every time and insists on doing it independently. Offered for patient to come to clinic for dressing changes if needed. Patient declines. Patient asked to perform dressing change today for VAD Coordinator to observe technique. See documentation below.   Vital Signs:  Doppler Pressure: 72 Automatc BP: 92/62 (74) HR: 87 SPO2: 97  % RA Weight: 197.6 lb w/ eqt Last weight: 193.4 lb w/ ept BMI today 27.6 today  VAD Indication: Destination Therapy due to uncontrolled diabetes   LVAD assessment: HM III: VAD Speed: 5400 rpms Flow: 4.9 Power: 4.1w    PI: 3.1 Hct: 30   Alarms: none Events: 0-5  Fixed speed: 5400 Low speed limit: 5100  Primary controller: back up battery due for replacement in  24 months Secondary controller:  back up battery due for replacement in 24 months  I reviewed the LVAD parameters from today and compared the results to the patient's prior recorded data. LVAD interrogation was NEGATIVE for significant power changes, NEGATIVE for clinical alarms and STABLE for PI events/speed drops. No programming changes were made and pump is functioning within specified parameters. Pt is performing daily controller and system monitor self tests along with completing weekly and monthly maintenance for LVAD equipment.   LVAD equipment check completed and is in good working order. Back-up equipment present. Charged back up battery and performed self-test on equipment.    Annual Equipment Maintenance on UBC/PM was performed on 06/2022.   [name], [name]  [name]     10/10/2022    1:17 PM  [name]  1 a. Ability to shower/bathe Not at all limited  1 b. Ability to walk 1 block Slightly limited  1 c. Ability to hurry/jog Extremely limited  2. Edema feet/ankles/legs Less than once a week  3. Limited by fatigue 1-2 times a week  4. Limited by dyspnea 1-2 times a week  5. Sitting up / on 3+ pillows Never over the past 2 weeks  6. Limited enjoyment of life Not limited at all  7. Rest of life w/ symptoms Completely satisfied  8 a. Participation in hobbies Slightly limited  8 b. Participation in chores Slightly limited  8 c. Visiting family/friends Did not limit at all  Patient completed 1000 feet  during 6 minute walk test. Tolerated well.   Neurocognitive trail unable to complete correctly.   Exit Site Care: Existing VAD dressing removed and site care performed using sterile technique by pt with VAD Coordinator observation with minimal cues. Drive line exit site cleaned with Chlora prep applicators x 2, allowed to dry, and Sorbaview dressing with Silverlon patch applied. Exit site healed and incorporated, the velour is fully implanted at exit site. No redness, tenderness, drainage, foul odor or rash noted. Drive line anchor re-applied. Pt denies fever or chills. Patient completely dressing in sterile form. Advised to allow [name] to perform or assist to avoid infection. Advanced to weekly dressings. Pt instructed to call VAD Coordinator if he notices increases drainage, redness, or tenderness at site. Pt given 7 weekly dressing kits at today's visit.    Significant Events on VAD Support:  none   Device: N/A    BP & Labs:  Doppler 86 - Doppler is reflecting MAP   Hgb 11.1 - No S/S of bleeding. Specifically denies melena/BRBPR or nosebleeds.   LDH stable at 241  -Denies tea-colored urine. No power elevations noted on interrogation.   Patient Instructions: Increase Torsemide to 56m daily (4 tablets) Start Spiriva inhaler once daily STOP Amiodarone Try to limit fluid intake  BMET 10 days ATown 'n' CountryRN,BSN VAD Coordinator  Office: [phone] 24/7 Pager: [phone]    ADVANCED HF/VAD CLINIC NOTE   Darryl LHagemeieris a 62y.o. with history of atrial fibrillation/flutter and nonischemic cardiomyopathy who was initially referred by Dr. BHarl Bowiefor evaluation of CHF.  [facility] [place]  At the time, he drank heavily and used cocaine.  He stopped drinking, smoking, and cocaine in 2020 and has not used any of these substances since then. Echo in 11/20 showed EF < 10%, cath in 12/20 showed normal coronaries.  Echo has  shown persistently low EF over time, echo in 2/23 showed EF 20-25%.  He has a Medtronic ICD.      Patient was admitted in 2/23 with atrial flutter/RVR. He was started on amiodarone and converted to NSR.  He had atrial flutter ablation in 5/23 and amiodarone was stopped  Echo was done in 11/23 showing elevated filling pressure and markedly low cardiac output.  Patient was admitted, started on milrinone, and underwent HM3 LVAD placement with LAA clipping later in 11/23.  Post-op course complicated by acute hypoxemic respiratory failure post-op and PNA.  Patient went to CIR after acute hospital course.   Returns for LVAD followup. Weight is up 4 lbs.  He is doing cardiac rehab but took a break this week as his mother passed away last weekend.  Breathing is overall better.  Can walk without rollator but takes with him for longer distances for balance.  He is short of breath with stairs, inclines.  No orthopnea/PND.  No lightheadedness.     Ramp echo done today, speed increased to 5500 rpm.   Labs (12/23): K 4.3, creatinine 1.25 => 1.45, Na 128, hgb  8.8 => 9.6, LDH 211, LFTs normal, TSH normal, digoxin 0.8 Labs (1/24): K 3.9, creatinine 1.32, LDH 224, hgb 10.4 Labs (2/24): K 4.5, creatinine 1.33  PMH: 1. Atrial fibrillation/flutter: Mainly flutter.  - 5/23 Atrial flutter ablation.  2. H/o DVT 3. Chronic systolic CHF: Nonischemic cardiomyopathy.  Medtronic ICD.  - Echo (11/20): EF < 10% - Cath (12/20): Normal coronaries.  - Echo (8/22): EF 30%, mildly decreased RV systolic function.  - Echo (2/23): EF 20-25%, normal RV.  - RHC (11/23): mean RA 14, PA 66/53 mean 54, mean PCWP 43, CI 1.62 F/1.31 T - HM3 LVAD with LAA clipping on 07/04/22.  - Ramp echo (2/24): Speed increased to 5500 rpm.  At this speed, IV septum was midline.  Aortic valve opened 1/3 beats.  Mild RV enlargement with moderate RV dysfunction.  4. Prior cocaine abuse.  5. Prior heavy ETOH 6. COPD: Moderate obstruction on 11/23 PFTs.  No longer smokes.    SH: Patient was a heavy drinker, abused cocaine, and smoked in the past but quit in 2020.  Lives in [place]. He does part-time custodial work.    FH: Mother with PPM, aunt with CHF.    VAD Indication: Destination Therapy due to uncontrolled diabetes   LVAD assessment: HM III: See LVAD nurse's note above.    I reviewed the LVAD parameters from today and compared the results to the patient's prior recorded data. LVAD interrogation was NEGATIVE for significant power changes, NEGATIVE for clinical alarms and STABLE for PI events/speed drops. No programming changes were made and pump is functioning within specified parameters. Pt is performing daily controller and system monitor self tests along with completing weekly and monthly maintenance for LVAD equipment.   LVAD equipment check completed and is in good working order. Back-up equipment present. Charged back up battery and performed self-test on equipment.    Annual Equipment Maintenance on UBC/PM was performed on 06/2022.      ROS: All systems negative except as listed in HPI, PMH and Problem List.  Current Outpatient Medications  Medication Sig Dispense Refill   atorvastatin (LIPITOR) 20 MG tablet Take 1 tablet (20 mg total) by mouth daily. 90 tablet 3   Calcium Carb-Cholecalciferol 600-10 MG-MCG TABS Take 1 tablet by mouth daily with breakfast. 90 tablet 3   Continuous Blood Gluc Receiver (FREESTYLE LIBRE 3 READER) DEVI 1 each by Does not apply route daily. 2 each 3   Continuous Blood Gluc Sensor (FREESTYLE LIBRE 3 SENSOR) MISC 1 each by Does not apply route daily. Place 1 sensor on the skin every 14 days. Use to check glucose continuously 2 each 3   digoxin (LANOXIN) 0.125 MG tablet Take 0.5 tablets (0.0625 mg total) by mouth daily. 30 tablet 5   empagliflozin (JARDIANCE) 10 MG TABS tablet Take 1 tablet (10 mg total) by mouth daily. 30 tablet 6   insulin glargine (SEMGLEE, YFGN,) 100 UNIT/ML Solostar Pen Inject  22 Units into the skin at bedtime. 15 mL 0   insulin glargine-yfgn (SEMGLEE) 100 UNIT/ML injection Inject 0.13 mLs (13 Units total) into the skin daily. 10 mL 11   insulin lispro (HUMALOG KWIKPEN) 100 UNIT/ML KwikPen Use 4 units three times a day with meals 15 mL 0   Insulin Pen Needle (PEN NEEDLES) 32G X 4 MM MISC Use with insulin pen with breakfast, with lunch, and with evening meal. 100 each 0   levothyroxine (SYNTHROID) 25 MCG tablet Take 1 tablet (25 mcg total) by mouth daily before breakfast.  90 tablet 3   magnesium oxide (MAG-OX) 400 (240 Mg) MG tablet Take 1 tablet (400 mg total) by mouth 2 (two) times daily. 180 tablet 3   Multiple Vitamin (MULTIVITAMIN WITH MINERALS) TABS tablet Take 1 tablet by mouth daily.     pantoprazole (PROTONIX) 40 MG tablet Take 1 tablet (40 mg total) by mouth daily. 90 tablet 3   potassium chloride SA (KLOR-CON M) 20 MEQ tablet Take 2 tablets (40 mEq total) by mouth daily. 180 tablet 3   sertraline (ZOLOFT) 50 MG tablet Take 1 tablet (50 mg total) by mouth daily. 90 tablet 3   sildenafil (REVATIO) 20 MG tablet Take 1 tablet (20 mg total) by mouth 3 (three) times daily. 90 tablet 6   spironolactone (ALDACTONE) 25 MG tablet Take 1 tablet (25 mg total) by mouth daily. 90 tablet 3   tiotropium (SPIRIVA HANDIHALER) 18 MCG inhalation capsule Place 1 capsule (18 mcg total) into inhaler and inhale daily. 30 capsule 12   warfarin (COUMADIN) 3 MG tablet Take 1 tablet (3 mg total) by mouth daily at 4 PM. Take 1 tablet (3 mg) daily, or as directed by heart failure clinic 90 tablet 3   acetaminophen (TYLENOL) 325 MG tablet Take 1-2 tablets (325-650 mg total) by mouth every 4 (four) hours as needed for mild pain. (Patient not taking: Reported on 10/10/2022)     enoxaparin (LOVENOX) 40 MG/0.4ML injection Inject 0.4 mLs (40 mg total) into the skin every 12 (twelve) hours. (Patient not taking: Reported on 10/10/2022) 4 mL 0   Fe Fum-Vit C-Vit B12-FA (TRIGELS-F FORTE) CAPS capsule  Take 1 capsule by mouth daily after breakfast. (Patient not taking: Reported on 10/10/2022) 30 capsule 6   torsemide (DEMADEX) 20 MG tablet Take 4 tablets (80 mg total) by mouth daily. Take 80 mg (4 pills) alternating with 60 mg (3 pills) 180 tablet 3   No current facility-administered medications for this encounter.    Patient has no known allergies.  REVIEW OF SYSTEMS: All systems negative except as listed in HPI, PMH and Problem list.   LVAD INTERROGATION:  See LVAD nurse's note above.   I reviewed the LVAD parameters from today, and compared the results to the patient's prior recorded data.  No programming changes were made.  The LVAD is functioning within specified parameters.  The patient performs LVAD self-test daily.  LVAD interrogation was negative for any significant power changes, alarms or PI events/speed drops.  LVAD equipment check completed and is in good working order.  Back-up equipment present.   LVAD education done on emergency procedures and precautions and reviewed exit site care.    Vitals:   10/10/22 1258 10/10/22 1259  BP: 92/62 (!) 72/0  Pulse: 87   SpO2: 97%   Weight: 89.6 kg (197 lb 9.6 oz)    Vital Signs:  BP (!) 72/0   Pulse 87   Wt 89.6 kg (197 lb 9.6 oz)   SpO2 97%   BMI 27.56 kg/m  MAP 74 Physical Exam: General: Well appearing this am. NAD.  HEENT: Normal. Neck: Supple, JVP 8-9 cm with HJR. Carotids OK.  Cardiac:  Mechanical heart sounds with LVAD hum present.  Lungs:  CTAB, normal effort.  Abdomen:  NT, ND, no HSM. No bruits or masses. +BS  LVAD exit site: Well-healed and incorporated. Dressing dry and intact. No erythema or drainage. Stabilization device present and accurately applied. Driveline dressing changed daily per sterile technique. Extremities:  Warm and dry. No cyanosis, clubbing,  rash, or edema.  Neuro:  Alert & oriented x 3. Cranial nerves grossly intact. Moves all 4 extremities w/o difficulty. Affect pleasant    ASSESSMENT AND  PLAN:   1. Chronic systolic CHF: Nonischemic cardiomyopathy, diagnosed 2020.  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  Cath in 12/20 with no significant coronary disease.  Medtronic ICD. Echo in 2/23 showed EF 20-25% with normal RV.  [place] in 11/13 showed markedly elevated filling pressures, primarily pulmonary venous hypertension, low cardiac output, and low PAPI. Echo in 11/23 with EF <20%, mod RV dysfunction.  He was admitted and started on milrinone with improvement in hemodynamics.  We proceeded with HM3 LVAD with LAA clip on 07/04/22.  NYHA class II-III, still with mild volume overload on exam. I also suspect that some of his dyspnea is due to COPD. Ramp echo done today, speed increased to 5500 rpm.  - Increase torsemide to 80 mg daily.  BMET today and in 10 days.  - Continue Jardiance 10 mg daily.  - Continue sildenafil 20 tid for RV.  - Continue spironolactone 25 mg daily.  - Continue digoxin 0.125 daily, check level today.  - Continue cardiac rehab.   2. Atrial fibrillation/atrial flutter: H/o flutter ablation.  Had AF/RVR post-LVAD, went back into NSR.  - I think he can stop amiodarone today.  - Continue warfarin.  3. VAD management: s/p HM-3 VAD on 07/04/22. Ramp echo 07/12/22 RV mild to mod HK. Speed turned to 5400. MAP 74 today.  No alarms, no PIs. LVAD parameters stable. Driveline site ok.  - Now off ASA.  - Continue warfarin, INR goal 2-2.5. 4. H/o DVT: On anticoagulation.  5. CKD stage 3: On Jardiance.  - BMET today.  6. Hypothyroidism - On levothyroxine.  7. Anemia: post-op.  - CBC today.  8. COPD: Moderate on 11/23 PFTs.  Suspect this plays a role in his dyspnea.  - I will start him on Spiriva.  - Refer to pulmonary in [place].   Followup in 6 wks.   Loralie Champagne 10/10/2022

## 2022-10-10 NOTE — Progress Notes (Signed)
Echocardiogram 2D Echocardiogram has been performed.  Fidel Levy 10/10/2022, 9:59 AM

## 2022-10-10 NOTE — Patient Instructions (Addendum)
Increase Torsemide to 52m daily (4 tablets) Start Spiriva inhaler once daily STOP Amiodarone Try to limit fluid intake  BMET 10 days AForestine Na

## 2022-10-11 ENCOUNTER — Encounter (HOSPITAL_COMMUNITY): Payer: 59

## 2022-10-13 ENCOUNTER — Encounter (HOSPITAL_COMMUNITY): Payer: 59

## 2022-10-13 ENCOUNTER — Other Ambulatory Visit (HOSPITAL_COMMUNITY): Payer: Self-pay | Admitting: *Deleted

## 2022-10-13 DIAGNOSIS — Z7901 Long term (current) use of anticoagulants: Secondary | ICD-10-CM

## 2022-10-13 DIAGNOSIS — Z95811 Presence of heart assist device: Secondary | ICD-10-CM

## 2022-10-16 ENCOUNTER — Encounter (HOSPITAL_COMMUNITY)
Admission: RE | Admit: 2022-10-16 | Discharge: 2022-10-16 | Disposition: A | Discharge status: Home / Self Care | Payer: 59 | Source: Ambulatory Visit | Attending: Cardiology | Admitting: Cardiology

## 2022-10-16 ENCOUNTER — Other Ambulatory Visit (HOSPITAL_COMMUNITY)
Admission: RE | Admit: 2022-10-16 | Discharge: 2022-10-16 | Disposition: A | Discharge status: Home / Self Care | Payer: 59 | Source: Ambulatory Visit | Attending: Cardiology | Admitting: Cardiology

## 2022-10-16 VITALS — Wt 199.3 lb

## 2022-10-16 DIAGNOSIS — I5042 Chronic combined systolic (congestive) and diastolic (congestive) heart failure: Secondary | ICD-10-CM

## 2022-10-16 DIAGNOSIS — Z95811 Presence of heart assist device: Secondary | ICD-10-CM | POA: Diagnosis present

## 2022-10-16 DIAGNOSIS — Z7901 Long term (current) use of anticoagulants: Secondary | ICD-10-CM | POA: Insufficient documentation

## 2022-10-16 LAB — BASIC METABOLIC PANEL
Anion gap: 11 (ref 5–15)
BUN: 31 mg/dL — ABNORMAL HIGH (ref 6–20)
CO2: 25 mmol/L (ref 22–32)
Calcium: 9.2 mg/dL (ref 8.9–10.3)
Chloride: 97 mmol/L — ABNORMAL LOW (ref 98–111)
Creatinine, Ser: 1.26 mg/dL — ABNORMAL HIGH (ref 0.61–1.24)
GFR, Estimated: >60 mL/min (ref >60)
Glucose, Bld: 216 mg/dL — ABNORMAL HIGH (ref 70–99)
Potassium: 4.2 mmol/L (ref 3.5–5.1)
Sodium: 133 mmol/L — ABNORMAL LOW (ref 135–145)

## 2022-10-16 LAB — PROTIME-INR
INR: 1.8 — ABNORMAL HIGH (ref 0.8–1.2)
Prothrombin Time: 21 seconds — ABNORMAL HIGH (ref 11.4–15.2)

## 2022-10-16 NOTE — Progress Notes (Signed)
Daily Session Note  Patient Details  Name: Darryl Frye MRN: [id number] Date of Birth: May 18, 1961 Referring Provider:   [name] from 08/28/2022 in [place]  Referring Provider Dr. Tenny Craw       Encounter Date: 10/16/2022  Check In:  Session Check In - 10/16/22 1441       Check-In   Supervising physician immediately available to respond to emergencies [facility] MD immediately available    Physician(s) Dr Harl Bowie    Location [facility]    Staff Present Leana Roe, BS, Exercise Physiologist;[place] Hassell Done, RN, BSN    Virtual Visit No    Medication changes reported     No    Comments Increased Torsemide '80mg'$  (4 tabs) alternating '60mg'$  (3 tabs)    Comments He has not fallen, but he does use a rollator due to weakness.    Tobacco Cessation No Change    Warm-up and Cool-down Performed as group-led instruction    Resistance Training Performed Yes    VAD Patient? Yes    PAD/SET Patient? No      VAD patient   Has back up controller? Yes    Has spare charged batteries? Yes    Has battery cables? Yes    Has compatible battery clips? Yes      Pain Assessment   Currently in Pain? No/denies    Pain Score 0-No pain    Multiple Pain Sites No             Capillary Blood Glucose: No results found for this or any previous visit (from the past 24 hour(s)).    Social History   Tobacco Use  Smoking Status Former   Packs/day: 1.00   Years: 20.00   Total pack years: 20.00   Types: Cigarettes   Quit date: 07/07/2019   Years since quitting: 3.2  Smokeless Tobacco Never    Goals Met:  Independence with exercise equipment Exercise tolerated well No report of concerns or symptoms today Strength training completed today  Goals Unmet:  Not Applicable  Comments: Checkout at 1545.   Dr. Carlyle Dolly is Medical Director for [facility]

## 2022-10-17 ENCOUNTER — Ambulatory Visit (HOSPITAL_COMMUNITY): Payer: Self-pay | Admitting: Pharmacist

## 2022-10-18 ENCOUNTER — Encounter (HOSPITAL_COMMUNITY): Payer: 59

## 2022-10-18 NOTE — Progress Notes (Signed)
Cardiac Individual Treatment Plan  Patient Details  Name: Darryl Frye MRN: [id number] Date of Birth: 1960/12/03 Referring Provider:   [name] from 08/28/2022 in [facility]  Referring Provider Dr. Tenny Craw       Initial Encounter Date:  [name] from 08/28/2022 in [facility]  Date 08/28/22       Visit Diagnosis: Chronic combined systolic and diastolic heart failure ([place])  LVAD (left ventricular assist device) present ([place])  Patient's Home Medications on Admission:  Current Outpatient Medications:    acetaminophen (TYLENOL) 325 MG tablet, Take 1-2 tablets (325-650 mg total) by mouth every 4 (four) hours as needed for mild pain. (Patient not taking: Reported on 10/10/2022), Disp: , Rfl:    atorvastatin (LIPITOR) 20 MG tablet, Take 1 tablet (20 mg total) by mouth daily., Disp: 90 tablet, Rfl: 3   Calcium Carb-Cholecalciferol 600-10 MG-MCG TABS, Take 1 tablet by mouth daily with breakfast., Disp: 90 tablet, Rfl: 3   Continuous Blood Gluc Receiver (FREESTYLE LIBRE 3 READER) DEVI, 1 each by Does not apply route daily., Disp: 2 each, Rfl: 3   Continuous Blood Gluc Sensor (FREESTYLE LIBRE 3 SENSOR) MISC, 1 each by Does not apply route daily. Place 1 sensor on the skin every 14 days. Use to check glucose continuously, Disp: 2 each, Rfl: 3   digoxin (LANOXIN) 0.125 MG tablet, Take 0.5 tablets (0.0625 mg total) by mouth daily., Disp: 30 tablet, Rfl: 5   empagliflozin (JARDIANCE) 10 MG TABS tablet, Take 1 tablet (10 mg total) by mouth daily., Disp: 30 tablet, Rfl: 6   enoxaparin (LOVENOX) 40 MG/0.4ML injection, Inject 0.4 mLs (40 mg total) into the skin every 12 (twelve) hours. (Patient not taking: Reported on 10/10/2022), Disp: 4 mL, Rfl: 0   Fe Fum-Vit C-Vit B12-FA (TRIGELS-F FORTE) CAPS capsule, Take 1 capsule by mouth daily after breakfast. (Patient not taking: Reported on  10/10/2022), Disp: 30 capsule, Rfl: 6   insulin glargine (SEMGLEE, YFGN,) 100 UNIT/ML Solostar Pen, Inject 22 Units into the skin at bedtime., Disp: 15 mL, Rfl: 0   insulin glargine-yfgn (SEMGLEE) 100 UNIT/ML injection, Inject 0.13 mLs (13 Units total) into the skin daily., Disp: 10 mL, Rfl: 11   insulin lispro (HUMALOG KWIKPEN) 100 UNIT/ML KwikPen, Use 4 units three times a day with meals, Disp: 15 mL, Rfl: 0   Insulin Pen Needle (PEN NEEDLES) 32G X 4 MM MISC, Use with insulin pen with breakfast, with lunch, and with evening meal., Disp: 100 each, Rfl: 0   levothyroxine (SYNTHROID) 25 MCG tablet, Take 1 tablet (25 mcg total) by mouth daily before breakfast., Disp: 90 tablet, Rfl: 3   magnesium oxide (MAG-OX) 400 (240 Mg) MG tablet, Take 1 tablet (400 mg total) by mouth 2 (two) times daily., Disp: 180 tablet, Rfl: 3   Multiple Vitamin (MULTIVITAMIN WITH MINERALS) TABS tablet, Take 1 tablet by mouth daily., Disp: , Rfl:    pantoprazole (PROTONIX) 40 MG tablet, Take 1 tablet (40 mg total) by mouth daily., Disp: 90 tablet, Rfl: 3   potassium chloride SA (KLOR-CON M) 20 MEQ tablet, Take 2 tablets (40 mEq total) by mouth daily., Disp: 180 tablet, Rfl: 3   sertraline (ZOLOFT) 50 MG tablet, Take 1 tablet (50 mg total) by mouth daily., Disp: 90 tablet, Rfl: 3   sildenafil (REVATIO) 20 MG tablet, Take 1 tablet (20 mg total) by mouth 3 (three) times daily., Disp: 90 tablet, Rfl:  6   spironolactone (ALDACTONE) 25 MG tablet, Take 1 tablet (25 mg total) by mouth daily., Disp: 90 tablet, Rfl: 3   tiotropium (SPIRIVA HANDIHALER) 18 MCG inhalation capsule, Place 1 capsule (18 mcg total) into inhaler and inhale daily., Disp: 30 capsule, Rfl: 12   torsemide (DEMADEX) 20 MG tablet, Take 4 tablets (80 mg total) by mouth daily. Take 80 mg (4 pills) alternating with 60 mg (3 pills), Disp: 180 tablet, Rfl: 3   warfarin (COUMADIN) 3 MG tablet, Take 1 tablet (3 mg total) by mouth daily at 4 PM. Take 1 tablet (3 mg) daily, or  as directed by heart failure clinic, Disp: 90 tablet, Rfl: 3  Past Medical History: Past Medical History:  Diagnosis Date   Acute kidney injury ([place])    Acute respiratory failure ([place])    Atrial flutter ([place])    on Eliquis   CHF (congestive heart failure) ([place])    EF 20-25% 10/15/21, AICD in place.   Coronary artery disease    Diabetes mellitus without complication ([place])    DVT (deep venous thrombosis) ([place]) 06/2019   Heart murmur    Hypertension    Presence of permanent cardiac pacemaker    Renal disorder     Tobacco Use: Social History   Tobacco Use  Smoking Status Former   Packs/day: 1.00   Years: 20.00   Total pack years: 20.00   Types: Cigarettes   Quit date: 07/07/2019   Years since quitting: 3.2  Smokeless Tobacco Never    Labs: Review Flowsheet  More data exists      Latest Ref Rng & Units 07/17/2022 07/18/2022 07/19/2022 07/20/2022 07/21/2022  Labs for ITP Cardiac and Pulmonary Rehab  O2 Saturation % 66.5  52  51.2  90.2  64.4  69.2  51.5  43.8  50.4     Capillary Blood Glucose: Lab Results  Component Value Date   GLUCAP 140 (H) 09/08/2022   GLUCAP 215 (H) 09/06/2022   GLUCAP 171 (H) 09/04/2022   GLUCAP 128 (H) 08/28/2022   GLUCAP 104 (H) 07/31/2022    POCT Glucose     Row Name 08/28/22 1419             POCT Blood Glucose   Pre-Exercise 126 mg/dL                Exercise Target Goals: Exercise Program Goal: Individual exercise prescription set using results from initial 6 min walk test and THRR while considering  patient's activity barriers and safety.   Exercise Prescription Goal: Starting with aerobic activity 30 plus minutes a day, 3 days per week for initial exercise prescription. Provide home exercise prescription and guidelines that participant acknowledges understanding prior to discharge.  Activity Barriers & Risk Stratification:  Activity Barriers & Cardiac Risk Stratification - 08/28/22 1335       Activity Barriers &  Cardiac Risk Stratification   Activity Barriers Deconditioning;Muscular Weakness;Shortness of Breath;Decreased Ventricular Function;Assistive Device;Other (comment)    Comments neuropathy both feet    Cardiac Risk Stratification High             6 Minute Walk:  6 Minute Walk     Row Name 08/28/22 1427         6 Minute Walk   Phase Initial     Distance 800 feet     Walk Time 6 minutes     # of Rest Breaks 0     MPH 1.51     METS 2.41  RPE 11     VO2 Peak 8.44     Symptoms No     Resting HR 82 bpm     Resting BP --  76 Doppler     Resting Oxygen Saturation  97 %     Exercise Oxygen Saturation  during 6 min walk 96 %     Max Ex. HR 98 bpm     Max Ex. BP --  86 Doppler     2 Minute Post BP --  80 Doppler              Oxygen Initial Assessment:   Oxygen Re-Evaluation:   Oxygen Discharge (Final Oxygen Re-Evaluation):   Initial Exercise Prescription:  Initial Exercise Prescription - 08/28/22 1500       Date of Initial Exercise RX and Referring Provider   Date 08/28/22    Referring Provider Dr. Tenny Craw    Expected Discharge Date 11/24/22      NuStep   Level 1    SPM 60    Minutes 39      Prescription Details   Frequency (times per week) 3    Duration Progress to 30 minutes of continuous aerobic without signs/symptoms of physical distress      Intensity   THRR 40-80% of Max Heartrate 64-129    Ratings of Perceived Exertion 11-13      Resistance Training   Training Prescription Yes    Weight 3    Reps 10-15             Perform Capillary Blood Glucose checks as needed.  Exercise Prescription Changes:   Exercise Prescription Changes     Row Name 09/04/22 1500 09/18/22 1500 10/02/22 1500 10/16/22 1500       Response to Exercise   Blood Pressure (Admit) --  80 Doppler --  84 Doppler --  MAP 76 --  88 MAP    Blood Pressure (Exercise) --  78 Doppler --  82 Dobbler --  MAP 92 --  86 MAP    Blood Pressure (Exit) --  74 Doppler --  76  Doppler --  MAP 88 --  82 MAP    Heart Rate (Admit) 108 bpm 81 bpm 96 bpm 92 bpm    Heart Rate (Exercise) 124 bpm 123 bpm 110 bpm 125 bpm    Heart Rate (Exit) 111 bpm 90 bpm 95 bpm 101 bpm    Rating of Perceived Exertion (Exercise) '12 13 12 13    '$ Duration Continue with 30 min of aerobic exercise without signs/symptoms of physical distress. Continue with 30 min of aerobic exercise without signs/symptoms of physical distress. Continue with 30 min of aerobic exercise without signs/symptoms of physical distress. Continue with 30 min of aerobic exercise without signs/symptoms of physical distress.    Intensity THRR unchanged THRR unchanged THRR unchanged THRR unchanged      Progression   Progression Continue to progress workloads to maintain intensity without signs/symptoms of physical distress. Continue to progress workloads to maintain intensity without signs/symptoms of physical distress. Continue to progress workloads to maintain intensity without signs/symptoms of physical distress. Continue to progress workloads to maintain intensity without signs/symptoms of physical distress.      Resistance Training   Training Prescription Yes Yes Yes Yes    Weight '2 2 2 3    '$ Reps 10-15 10-15 10-15 10-15    Time 10 Minutes 10 Minutes 10 Minutes 10 Minutes      Treadmill   MPH -- -- --  1.5    Grade -- -- -- 0    Minutes -- -- -- 17    METs -- -- -- 2.15      NuStep   Level '1 3 3 3    '$ SPM 85 100 108 105    Minutes 39 39 39 22    METs 2.07 2.47 2.77 2.66             Exercise Comments:   Exercise Goals and Review:   Exercise Goals     Row Name 08/28/22 1532 09/18/22 1500 10/16/22 1500         Exercise Goals   Increase Physical Activity Yes Yes Yes     Intervention Provide advice, education, support and counseling about physical activity/exercise needs.;Develop an individualized exercise prescription for aerobic and resistive training based on initial evaluation findings, risk  stratification, comorbidities and participant's personal goals. Provide advice, education, support and counseling about physical activity/exercise needs.;Develop an individualized exercise prescription for aerobic and resistive training based on initial evaluation findings, risk stratification, comorbidities and participant's personal goals. Provide advice, education, support and counseling about physical activity/exercise needs.;Develop an individualized exercise prescription for aerobic and resistive training based on initial evaluation findings, risk stratification, comorbidities and participant's personal goals.     Expected Outcomes Short Term: Attend rehab on a regular basis to increase amount of physical activity.;Bones Term: Add in home exercise to make exercise part of routine and to increase amount of physical activity.;C[place]ill Term: Exercising regularly at least 3-5 days a week. Short Term: Attend rehab on a regular basis to increase amount of physical activity.;Stallman Term: Add in home exercise to make exercise part of routine and to increase amount of physical activity.;[place]an Term: Exercising regularly at least 3-5 days a week. Short Term: Attend rehab on a regular basis to increase amount of physical activity.;[place] Term: Add in home exercise to make exercise part of routine and to increase amount of physical activity.;Lunt Term: Exercising regularly at least 3-5 days a week.     Increase Strength and Stamina Yes Yes Yes     Intervention Provide advice, education, support and counseling about physical activity/exercise needs.;Develop an individualized exercise prescription for aerobic and resistive training based on initial evaluation findings, risk stratification, comorbidities and participant's personal goals. Provide advice, education, support and counseling about physical activity/exercise needs.;Develop an individualized exercise prescription for aerobic and resistive training based on initial  evaluation findings, risk stratification, comorbidities and participant's personal goals. Provide advice, education, support and counseling about physical activity/exercise needs.;Develop an individualized exercise prescription for aerobic and resistive training based on initial evaluation findings, risk stratification, comorbidities and participant's personal goals.     Expected Outcomes Short Term: Increase workloads from initial exercise prescription for resistance, speed, and METs.;Short Term: Perform resistance training exercises routinely during rehab and add in resistance training at home;Soldo Term: Improve cardiorespiratory fitness, muscular endurance and strength as measured by increased METs and functional capacity (6MWT) Short Term: Increase workloads from initial exercise prescription for resistance, speed, and METs.;Short Term: Perform resistance training exercises routinely during rehab and add in resistance training at home;Penrod Term: Improve cardiorespiratory fitness, muscular endurance and strength as measured by increased METs and functional capacity (6MWT) Short Term: Increase workloads from initial exercise prescription for resistance, speed, and METs.;Short Term: Perform resistance training exercises routinely during rehab and add in resistance training at home;Salley Term: Improve cardiorespiratory fitness, muscular endurance and strength as measured by increased METs and functional capacity (6MWT)     Able  to understand and use rate of perceived exertion (RPE) scale Yes Yes Yes     Intervention Provide education and explanation on how to use RPE scale Provide education and explanation on how to use RPE scale Provide education and explanation on how to use RPE scale     Expected Outcomes Short Term: Able to use RPE daily in rehab to express subjective intensity level;Roache Term:  Able to use RPE to guide intensity level when exercising independently Short Term: Able to use RPE daily in rehab  to express subjective intensity level;Madkins Term:  Able to use RPE to guide intensity level when exercising independently Short Term: Able to use RPE daily in rehab to express subjective intensity level;Le[place]bauer Term:  Able to use RPE to guide intensity level when exercising independently     Knowledge and understanding of Target Heart Rate Range (THRR) Yes Yes Yes     Intervention Provide education and explanation of THRR including how the numbers were predicted and where they are located for reference Provide education and explanation of THRR including how the numbers were predicted and where they are located for reference Provide education and explanation of THRR including how the numbers were predicted and where they are located for reference     Expected Outcomes Short Term: Able to state/look up THRR;Orsini Term: Able to use THRR to govern intensity when exercising independently;Short Term: Able to use daily as guideline for intensity in rehab Short Term: Able to state/look up THRR;Moster Term: Able to use THRR to govern intensity when exercising independently;Short Term: Able to use daily as guideline for intensity in rehab Short Term: Able to state/look up THRR;Rains Term: Able to use THRR to govern intensity when exercising independently;Short Term: Able to use daily as guideline for intensity in rehab     Able to check pulse independently Yes Yes Yes     Intervention Provide education and demonstration on how to check pulse in carotid and radial arteries.;Review the importance of being able to check your own pulse for safety during independent exercise Provide education and demonstration on how to check pulse in carotid and radial arteries.;Review the importance of being able to check your own pulse for safety during independent exercise Provide education and demonstration on how to check pulse in carotid and radial arteries.;Review the importance of being able to check your own pulse for safety during  independent exercise     Expected Outcomes Short Term: Able to explain why pulse checking is important during independent exercise;Osoria Term: Able to check pulse independently and accurately Short Term: Able to explain why pulse checking is important during independent exercise;Code Term: Able to check pulse independently and accurately Short Term: Able to explain why pulse checking is important during independent exercise;Eidem Term: Able to check pulse independently and accurately     Understanding of Exercise Prescription Yes Yes Yes     Intervention Provide education, explanation, and written materials on patient's individual exercise prescription Provide education, explanation, and written materials on patient's individual exercise prescription Provide education, explanation, and written materials on patient's individual exercise prescription     Expected Outcomes Short Term: Able to explain program exercise prescription;[place] Term: Able to explain home exercise prescription to exercise independently Short Term: Able to explain program exercise prescription;Birnie Term: Able to explain home exercise prescription to exercise independently Short Term: Able to explain program exercise prescription;Askren Term: Able to explain home exercise prescription to exercise independently  Exercise Goals Re-Evaluation :  Exercise Goals Re-Evaluation     Row Name 09/18/22 1500 10/16/22 1500           Exercise Goal Re-Evaluation   Exercise Goals Review Increase Physical Activity;Able to understand and use rate of perceived exertion (RPE) scale;Increase Strength and Stamina;Knowledge and understanding of Target Heart Rate Range (THRR);Able to check pulse independently;Understanding of Exercise Prescription Increase Physical Activity;Increase Strength and Stamina;Able to understand and use rate of perceived exertion (RPE) scale;Knowledge and understanding of Target Heart Rate Range (THRR);Able to check  pulse independently;Understanding of Exercise Prescription      Comments Pt has completed 7 sessions of CR. He enjoys coming to class and talking to other patients. He is progressing and increasing his workloads each class. He is getting stronger and we plan to have him walk on the treadmill in the future sessions. He is currently exercising at 2.47 METs on the stepper. Will continue to monitor and progress as able. Pt has completed 13 sessions of cardiac rehab. He enjoys coming to class and is progessing. He is able to walk on the treadmill now and is walking in with a cane instead of his rollator. He is increasing his workload on the stepper as well. He is currently exercising at 2.66 METs on the stepepr. Will continue to monitor and progress as able.      Expected Outcomes Through exercise at rehab and home, the patient will meet thier stated goals Through exercise at rehab and home, the patient will meet thier stated goals                Discharge Exercise Prescription (Final Exercise Prescription Changes):  Exercise Prescription Changes - 10/16/22 1500       Response to Exercise   Blood Pressure (Admit) --   88 MAP   Blood Pressure (Exercise) --   86 MAP   Blood Pressure (Exit) --   82 MAP   Heart Rate (Admit) 92 bpm    Heart Rate (Exercise) 125 bpm    Heart Rate (Exit) 101 bpm    Rating of Perceived Exertion (Exercise) 13    Duration Continue with 30 min of aerobic exercise without signs/symptoms of physical distress.    Intensity THRR unchanged      Progression   Progression Continue to progress workloads to maintain intensity without signs/symptoms of physical distress.      Resistance Training   Training Prescription Yes    Weight 3    Reps 10-15    Time 10 Minutes      Treadmill   MPH 1.5    Grade 0    Minutes 17    METs 2.15      NuStep   Level 3    SPM 105    Minutes 22    METs 2.66             Nutrition:  Target Goals: Understanding of nutrition  guidelines, daily intake of sodium '1500mg'$ , cholesterol '200mg'$ , calories 30% from fat and 7% or less from saturated fats, daily to have 5 or more servings of fruits and vegetables.  Biometrics:  Pre Biometrics - 08/28/22 1533       Pre Biometrics   Height '5\' 11"'$  (1.803 m)    Weight 86.2 kg    Waist Circumference 38 inches    Hip Circumference 40 inches    Waist to Hip Ratio 0.95 %    BMI (Calculated) 26.52    Triceps Skinfold 5 mm    %  Body Fat 21.2 %    Grip Strength 46.6 kg    Flexibility 0 in    Single Leg Stand 0 seconds              Nutrition Therapy Plan and Nutrition Goals:  Nutrition Therapy & Goals - 09/11/22 1030       Personal Nutrition Goals   Comments Patient's diet assessment score was 63. We offer 2 educational sessions on heart healthy nutrition with handouts and assistance with RD referral if patient is interested.      Intervention Plan   Intervention Nutrition handout(s) given to patient.    Expected Outcomes Short Term Goal: Understand basic principles of dietary content, such as calories, fat, sodium, cholesterol and nutrients.             Nutrition Assessments:  Nutrition Assessments - 08/28/22 1341       MEDFICTS Scores   Pre Score 63            MEDIFICTS Score Key: ?70 Need to make dietary changes  40-70 Heart Healthy Diet ? 40 Therapeutic Level Cholesterol Diet   Picture Your Plate Scores: [id number] Unhealthy dietary pattern with much room for improvement. 41-50 Dietary pattern unlikely to meet recommendations for good health and room for improvement. 51-60 More healthful dietary pattern, with some room for improvement.  >60 Healthy dietary pattern, although there may be some specific behaviors that could be improved.    Nutrition Goals Re-Evaluation:   Nutrition Goals Discharge (Final Nutrition Goals Re-Evaluation):   Psychosocial: Target Goals: Acknowledge presence or absence of significant depression and/or stress,  maximize coping skills, provide positive support system. Participant is able to verbalize types and ability to use techniques and skills needed for reducing stress and depression.  Initial Review & Psychosocial Screening:  Initial Psych Review & Screening - 08/28/22 1337       Initial Review   Current issues with Current Sleep Concerns      Family Dynamics   Good Support System? Yes    Comments His brother, his ex wife, his sister in law, his brother in law, and his friends are his support system.      Barriers   Psychosocial barriers to participate in program There are no identifiable barriers or psychosocial needs.      Screening Interventions   Interventions Encouraged to exercise    Expected Outcomes [place] Term goal: The participant improves quality of Life and PHQ9 Scores as seen by post scores and/or verbalization of changes;Short Term goal: Identification and review with participant of any Quality of Life or Depression concerns found by scoring the questionnaire.             Quality of Life Scores:  Quality of Life - 08/28/22 1534       Quality of Life   Select Quality of Life      Quality of Life Scores   Health/Function Pre 17.15 %    Socioeconomic Pre 26.25 %    Psych/Spiritual Pre 27.5 %    Family Pre 28 %    GLOBAL Pre 22.48 %            Scores of 19 and below usually indicate a poorer quality of life in these areas.  A difference of  2-3 points is a clinically meaningful difference.  A difference of 2-3 points in the total score of the Quality of Life Index has been associated with significant improvement in overall quality of life, self-image, physical symptoms,  and general health in studies assessing change in quality of life.  PHQ-9: Review Flowsheet       08/28/2022  Depression screen PHQ 2/9  Decreased Interest 2  Down, Depressed, Hopeless 0  PHQ - 2 Score 2  Altered sleeping 3  Tired, decreased energy 3  Change in appetite 0  Feeling bad or  failure about yourself  0  Trouble concentrating 0  Moving slowly or fidgety/restless 0  Suicidal thoughts 0  PHQ-9 Score 8  Difficult doing work/chores Somewhat difficult   Interpretation of Total Score  Total Score Depression Severity:  1-4 = Minimal depression, 5-9 = Mild depression, 10-14 = Moderate depression, 15-19 = Moderately severe depression, 20-27 = Severe depression   Psychosocial Evaluation and Intervention:  Psychosocial Evaluation - 08/28/22 1415       Psychosocial Evaluation & Interventions   Interventions Stress management education;Relaxation education;Encouraged to exercise with the program and follow exercise prescription    Comments Pt's only potential barrier to participating in CR is transportation. He is not allowed to drive until [phone], and he will be relying on transportation from his insurance company. He said that the transportation has been reliable so far. He does report sleep concerns, but no other identifiable psychosocial issues. He does not currently take anything to aid his sleep. He scored an 8 on his PHQ-9, and this relates to his sleep, his lack of energy, and his inability to do things that he enjoys. 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 He seems to have a positive outlook, and is motivated to improve. He states that he has a good support system with his ex wife, brother, sister, in laws, and friends. His goals while in the program are to decrease his SOB, increase his strength/stamina, and to get his legs stronger. He is eager to start the program and is motivated.    Expected Outcomes Pt will continue to have no identifiable psychosocial issues.    Continue Psychosocial Services  No Follow up required             Psychosocial Re-Evaluation:  Psychosocial Re-Evaluation     [place] Name 08/30/22 [id number] 09/11/22 1228 10/09/22 1248         Psychosocial Re-Evaluation   Current issues with History of Depression None Identified None  Identified     Comments Pt is prescribed Zoloft. This was prescribed during his LVAD work up. Pt denied anxiety or depression during orienation, and he states that he is adjusting well to his LVAD. Patient is new to the program. He has completed 4 sessions and is doing well. He continues to have no psychosocial barriers identified. His continues Sertraline to manage depression and anxiety. He seems to enjoy the sessions and demonstrates an interest in improving his health. We will continue to monitor his progress. Patient has completed 12 sessions and is doing well. He continues to have no psychosocial barriers identified. His continues Sertraline to manage depression and anxiety. He continues to enjoy the sessions and demonstrates an interest in improving his health. We will continue to monitor his progress.     Expected Outcomes -- He will continue to have no psychosocial barriers identified. He will continue to have no psychosocial barriers identified.     Interventions -- Stress management education;Encouraged to attend Cardiac Rehabilitation for the exercise;Relaxation education Stress management education;Encouraged to attend Cardiac Rehabilitation for the exercise;Relaxation education     Continue Psychosocial Services  -- No Follow up required No  Follow up required              Psychosocial Discharge (Final Psychosocial Re-Evaluation):  Psychosocial Re-Evaluation - 10/09/22 1248       Psychosocial Re-Evaluation   Current issues with None Identified    Comments Patient has completed 12 sessions and is doing well. He continues to have no psychosocial barriers identified. His continues Sertraline to manage depression and anxiety. He continues to enjoy the sessions and demonstrates an interest in improving his health. We will continue to monitor his progress.    Expected Outcomes He will continue to have no psychosocial barriers identified.    Interventions Stress management  education;Encouraged to attend Cardiac Rehabilitation for the exercise;Relaxation education    Continue Psychosocial Services  No Follow up required             Vocational Rehabilitation: Provide vocational rehab assistance to qualifying candidates.   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.             Education: Education Goals: Education classes will be provided on a weekly basis, covering required topics. Participant will state understanding/return demonstration of topics presented.  Learning Barriers/Preferences:  Learning Barriers/Preferences - 08/28/22 1341       Learning Barriers/Preferences   Learning Barriers None    Learning Preferences Verbal Instruction             Education Topics: Hypertension, Hypertension Reduction -Define heart disease and high blood pressure. Discus how high blood pressure affects the body and ways to reduce high blood pressure.   Exercise and Your Heart -Discuss why it is important to exercise, the FITT principles of exercise, normal and abnormal responses to exercise, and how to exercise safely.   Angina -Discuss definition of angina, causes of angina, treatment of angina, and how to decrease risk of having angina. Flowsheet Row CARDIAC REHAB PHASE II EXERCISE from 10/04/2022 in [facility] [place]  Date 09/06/22  Educator DF  Instruction Review Code 2- Demonstrated Understanding       Cardiac Medications -Review what the following cardiac medications are used for, how they affect the body, and side effects that may occur when taking the medications.  Medications include Aspirin, Beta  blockers, calcium channel blockers, ACE Inhibitors, angiotensin receptor blockers, diuretics, digoxin, and antihyperlipidemics. Flowsheet Row CARDIAC REHAB PHASE II EXERCISE from 10/04/2022 in [place]  Date 09/20/22  Instruction Review Code 1- Verbalizes Understanding       Congestive Heart Failure -Discuss the definition of CHF, how to live with CHF, the signs and symptoms of CHF, and how keep track of weight and sodium intake. Flowsheet Row CARDIAC REHAB PHASE II EXERCISE from 10/04/2022 in [facility]  Date 09/13/22  Educator HB  Instruction Review Code 1- Verbalizes Understanding       Heart Disease and Intimacy -Discus the effect sexual activity has on the heart, how changes occur during intimacy as we age, and safety during sexual activity. Flowsheet Row CARDIAC REHAB PHASE II EXERCISE from 10/04/2022 in [place]  Date 10/04/22  Educator DF  Instruction Review Code 1- Verbalizes Understanding  Smoking Cessation / COPD -Discuss different methods to quit smoking, the health benefits of quitting smoking, and the definition of COPD.   Nutrition I: Fats -Discuss the types of cholesterol, what cholesterol does to the heart, and how cholesterol levels can be controlled.   Nutrition II: Labels -Discuss the different components of food labels and how to read food label   Heart Parts/Heart Disease and PAD -Discuss the anatomy of the heart, the pathway of blood circulation through the heart, and these are affected by heart disease.   Stress I: Signs and Symptoms -Discuss the causes of stress, how stress may lead to anxiety and depression, and ways to limit stress.   Stress II: Relaxation -Discuss different types of relaxation techniques to limit stress.   Warning Signs of Stroke / TIA -Discuss definition of a stroke, what the signs and symptoms are of a stroke, and how to identify when someone  is having stroke.   Knowledge Questionnaire Score:  Knowledge Questionnaire Score - 08/28/22 1342       Knowledge Questionnaire Score   Pre Score 21/24             Core Components/Risk Factors/Patient Goals at Admission:  Personal Goals and Risk Factors at Admission - 08/28/22 1347       Core Components/Risk Factors/Patient Goals on Admission   Improve shortness of breath with ADL's Yes    Intervention Provide education, individualized exercise plan and daily activity instruction to help decrease symptoms of SOB with activities of daily living.    Expected Outcomes Short Term: Improve cardiorespiratory fitness to achieve a reduction of symptoms when performing ADLs;[place] Term: Be able to perform more ADLs without symptoms or delay the onset of symptoms    Diabetes Yes    Intervention Provide education about signs/symptoms and action to take for hypo/hyperglycemia.;Provide education about proper nutrition, including hydration, and aerobic/resistive exercise prescription along with prescribed medications to achieve blood glucose in normal ranges: Fasting glucose 65-99 mg/dL    Expected Outcomes Short Term: Participant verbalizes understanding of the signs/symptoms and immediate care of hyper/hypoglycemia, proper foot care and importance of medication, aerobic/resistive exercise and nutrition plan for blood glucose control.;[place] Term: Attainment of HbA1C < 7%.    Heart Failure Yes    Intervention Provide a combined exercise and nutrition program that is supplemented with education, support and counseling about heart failure. Directed toward relieving symptoms such as shortness of breath, decreased exercise tolerance, and extremity edema.    Expected Outcomes Improve functional capacity of life;Short term: Attendance in program 2-3 days a week with increased exercise capacity. Reported lower sodium intake. Reported increased fruit and vegetable intake. Reports medication compliance.;Short  term: Daily weights obtained and reported for increase. Utilizing diuretic protocols set by physician.;[place] term: Adoption of self-care skills and reduction of barriers for early signs and symptoms recognition and intervention leading to self-care maintenance.    Lipids Yes    Intervention Provide education and support for participant on nutrition & aerobic/resistive exercise along with prescribed medications to achieve LDL '70mg'$ , HDL >'40mg'$ .    Expected Outcomes Short Term: Participant states understanding of desired cholesterol values and is compliant with medications prescribed. Participant is following exercise prescription and nutrition guidelines.;[place] Term: Cholesterol controlled with medications as prescribed, with individualized exercise RX and with personalized nutrition plan. Value goals: LDL < '70mg'$ , HDL > 40 mg.    Personal Goal Other Yes    Personal Goal Increase strength, stamina, and leg strength.    Intervention Attend CR  three days per week and begin a home exercise program.    Expected Outcomes Pt will meet stated goal.             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 He has multiple risk factors for CAD and is participating in the program for risk modification. He has completed 4 sessions. His current weight is 195.0 lbs. He is doing well in the program. His doppler readings are WNL. His DM is manged with Insulin and Jardiance. His last A1C on file was 11/23 at 9.3%. His personal goals for the program are to decrease his SOB; increase his strength and stamina; and  get his legs stronger. We will continue to monitor his progress as he works towards meeting these goals. Patient has completed 12 sessions. His current weight is 196.4 lbs up 1.4 lbs since last 30 day review. He continues to do well in the program with consistent attendance and progressions. His doppler readings are WNL. He saw Erin Sons, PA with cardiology for ICD follow up. No changes made. Patient did complain of SOB. It was felt he was euvolemic.  His DM continues to be manged with Insulin and Jardiance. His last A1C on file was 11/23 at 9.3%. His personal goals for the program are to decrease his SOB; increase his strength and stamina; and get his legs stronger. We will continue to monitor his progress as he works towards meeting these goals.      Expected Outcomes Patient will complete the program meeting both personal and program goals. Patient will complete the program meeting both personal and program goals.               Core Components/Risk Factors/Patient Goals at Discharge (Final Review):   Goals and Risk Factor Review - 10/09/22 1249       Core Components/Risk Factors/Patient Goals Review   Personal Goals Review Lipids;Diabetes;Improve shortness of breath with ADL's;Heart Failure    Review Patient has completed 12 sessions. His current weight is 196.4 lbs up 1.4 lbs since last 30 day review. He continues to do well in the program with consistent attendance and progressions. His doppler readings are WNL. He saw Erin Sons, PA with cardiology for ICD follow up. No changes made. Patient did complain of SOB. It was felt he was euvolemic.  His DM continues to be manged with Insulin and Jardiance. His last A1C on file was 11/23 at 9.3%. His personal goals for the program are to decrease his SOB; increase his strength and stamina; and get his legs stronger. We will continue to monitor his progress as he works towards meeting these goals.    Expected Outcomes Patient will complete the  program meeting both personal and program goals.             ITP Comments:   Comments: ITP REVIEW Pt is making expected progress toward Cardiac Rehab goals after completing 13 sessions. Recommend continued exercise, life style modification, education, and increased stamina and strength.

## 2022-10-19 ENCOUNTER — Other Ambulatory Visit (HOSPITAL_COMMUNITY): Payer: Self-pay

## 2022-10-19 DIAGNOSIS — Z95811 Presence of heart assist device: Secondary | ICD-10-CM

## 2022-10-19 DIAGNOSIS — Z7901 Long term (current) use of anticoagulants: Secondary | ICD-10-CM

## 2022-10-20 ENCOUNTER — Encounter (HOSPITAL_COMMUNITY): Payer: 59

## 2022-10-23 ENCOUNTER — Encounter (HOSPITAL_COMMUNITY)
Admission: RE | Admit: 2022-10-23 | Discharge: 2022-10-23 | Disposition: A | Discharge status: Home / Self Care | Payer: 59 | Source: Ambulatory Visit | Attending: Cardiology | Admitting: Cardiology

## 2022-10-23 ENCOUNTER — Ambulatory Visit (HOSPITAL_COMMUNITY): Payer: Self-pay | Admitting: Pharmacist

## 2022-10-23 ENCOUNTER — Other Ambulatory Visit (HOSPITAL_COMMUNITY)
Admission: RE | Admit: 2022-10-23 | Discharge: 2022-10-23 | Disposition: A | Discharge status: Home / Self Care | Payer: 59 | Source: Ambulatory Visit | Attending: Cardiology | Admitting: Cardiology

## 2022-10-23 DIAGNOSIS — I5042 Chronic combined systolic (congestive) and diastolic (congestive) heart failure: Secondary | ICD-10-CM | POA: Insufficient documentation

## 2022-10-23 DIAGNOSIS — Z95811 Presence of heart assist device: Secondary | ICD-10-CM | POA: Diagnosis present

## 2022-10-23 DIAGNOSIS — Z7901 Long term (current) use of anticoagulants: Secondary | ICD-10-CM | POA: Insufficient documentation

## 2022-10-23 LAB — BASIC METABOLIC PANEL
Anion gap: 8 (ref 5–15)
BUN: 24 mg/dL — ABNORMAL HIGH (ref 6–20)
CO2: 24 mmol/L (ref 22–32)
Calcium: 9.2 mg/dL (ref 8.9–10.3)
Chloride: 100 mmol/L (ref 98–111)
Creatinine, Ser: 1.14 mg/dL (ref 0.61–1.24)
GFR, Estimated: >60 mL/min (ref >60)
Glucose, Bld: 196 mg/dL — ABNORMAL HIGH (ref 70–99)
Potassium: 4.5 mmol/L (ref 3.5–5.1)
Sodium: 132 mmol/L — ABNORMAL LOW (ref 135–145)

## 2022-10-23 LAB — PROTIME-INR
INR: 2.2 — ABNORMAL HIGH (ref 0.8–1.2)
Prothrombin Time: 24.4 seconds — ABNORMAL HIGH (ref 11.4–15.2)

## 2022-10-23 NOTE — Progress Notes (Signed)
Daily Session Note  Patient Details  Name: EMILY KRELLER MRN: [id number] Date of Birth: 1961-03-28 Referring Provider:   [name] from 08/28/2022 in [facility]  Referring Provider Dr. Tenny Craw       Encounter Date: 10/23/2022  Check In:  Session Check In - 10/23/22 1426       Check-In   Supervising physician immediately available to respond to emergencies [facility] MD immediately available    Physician(s) Dr Harl Bowie    Location [facility]    Staff Present Leana Roe, BS, Exercise Physiologist;Audreanna Torrisi Hassell Done, RN, BSN    Virtual Visit No    Medication changes reported     No    Comments Increased Torsemide '80mg'$  (4 tabs) alternating '60mg'$  (3 tabs)    Comments He has not fallen, but he does use a rollator due to weakness.    Tobacco Cessation No Change    Warm-up and Cool-down Performed as group-led instruction    Resistance Training Performed Yes    VAD Patient? Yes      VAD patient   Has back up controller? Yes    Has spare charged batteries? Yes    Has battery cables? Yes    Has compatible battery clips? Yes      Pain Assessment   Currently in Pain? No/denies    Pain Score 0-No pain    Multiple Pain Sites No             Capillary Blood Glucose: Results for orders placed or performed during the hospital encounter of 10/23/22 (from the past 24 hour(s))  Basic metabolic panel     Status: Abnormal   Collection Time: 10/23/22  2:01 PM  Result Value Ref Range   Sodium 132 (L) 135 - 145 mmol/L   Potassium 4.5 3.5 - 5.1 mmol/L   Chloride 100 98 - 111 mmol/L   CO2 24 22 - 32 mmol/L   Glucose, Bld 196 (H) 70 - 99 mg/dL   BUN 24 (H) 6 - 20 mg/dL   Creatinine, Ser 1.14 0.61 - 1.24 mg/dL   Calcium 9.2 8.9 - 10.3 mg/dL   GFR, Estimated >60 >60 mL/min   Anion gap 8 5 - 15  Protime-INR     Status: Abnormal   Collection Time: 10/23/22  2:01 PM  Result Value Ref Range   Prothrombin Time 24.4 (H)  11.4 - 15.2 seconds   INR 2.2 (H) 0.8 - 1.2      Social History   Tobacco Use  Smoking Status Former   Packs/day: 1.00   Years: 20.00   Total pack years: 20.00   Types: Cigarettes   Quit date: 07/07/2019   Years since quitting: 3.2  Smokeless Tobacco Never    Goals Met:  Independence with exercise equipment Exercise tolerated well No report of concerns or symptoms today Strength training completed today  Goals Unmet:  Not Applicable  Comments: Checkout at 1545.   Dr. Carlyle Dolly is Medical Director for [facility]

## 2022-10-25 ENCOUNTER — Encounter (HOSPITAL_COMMUNITY)
Admission: RE | Admit: 2022-10-25 | Discharge: 2022-10-25 | Disposition: A | Discharge status: Home / Self Care | Payer: 59 | Source: Ambulatory Visit | Attending: Cardiology | Admitting: Cardiology

## 2022-10-25 DIAGNOSIS — I5042 Chronic combined systolic (congestive) and diastolic (congestive) heart failure: Secondary | ICD-10-CM | POA: Diagnosis not present

## 2022-10-25 DIAGNOSIS — Z95811 Presence of heart assist device: Secondary | ICD-10-CM

## 2022-10-25 NOTE — Progress Notes (Signed)
Daily Session Note  Patient Details  Name: Darryl Frye MRN: [id number] Date of Birth: 03-09-1961 Referring Provider:   [name] from 08/28/2022 in [place]  Referring Provider Dr. Tenny Craw       Encounter Date: 10/25/2022  Check In:  Session Check In - 10/25/22 1445       Check-In   Supervising physician immediately available to respond to emergencies [facility] MD immediately available    Physician(s) Dr Harl Bowie    Location [facility]    Staff Present Leana Roe, BS, Exercise Physiologist;Hillary Troutman BSN, RN    Virtual Visit No    Medication changes reported     No    Fall or balance concerns reported    Yes    Comments He has not fallen, but he does use a rollator due to weakness.    Tobacco Cessation No Change    Warm-up and Cool-down Performed as group-led instruction    Resistance Training Performed Yes    VAD Patient? Yes    PAD/SET Patient? No      VAD patient   Has back up controller? Yes    Has spare charged batteries? Yes    Has battery cables? Yes    Has compatible battery clips? Yes      Pain Assessment   Currently in Pain? No/denies    Pain Score 0-No pain    Multiple Pain Sites No             Capillary Blood Glucose: No results found for this or any previous visit (from the past 24 hour(s)).    Social History   Tobacco Use  Smoking Status Former   Packs/day: 1.00   Years: 20.00   Total pack years: 20.00   Types: Cigarettes   Quit date: 07/07/2019   Years since quitting: 3.3  Smokeless Tobacco Never    Goals Met:  Independence with exercise equipment Exercise tolerated well No report of concerns or symptoms today Strength training completed today  Goals Unmet:  Not Applicable  Comments: check out 1545   Dr. Carlyle Dolly is Medical Director for [facility]

## 2022-10-26 ENCOUNTER — Telehealth (HOSPITAL_COMMUNITY): Payer: Self-pay | Admitting: *Deleted

## 2022-10-26 NOTE — Telephone Encounter (Signed)
Received call from pt reporting increased pain at exit site for the last several days. States pain started after changing to weekly kits 2 weeks ago. Reports he has been changing his own dressing, despite being instructed repeatedly to allow [name] to change his dressing, or come to [facility] for dressing changes. Offered to see pt in clinic this afternoon but pt states he is unable to come today. Appt scheduled tomorrow morning 10/27/22 at 0830 for drive line check.  Emerson Monte RN [place] Coordinator  Office: [phone]  24/7 Pager: [phone]

## 2022-10-27 ENCOUNTER — Ambulatory Visit (HOSPITAL_COMMUNITY)
Admission: RE | Admit: 2022-10-27 | Discharge: 2022-10-27 | Disposition: A | Discharge status: Home / Self Care | Payer: 59 | Source: Ambulatory Visit | Attending: Internal Medicine | Admitting: Internal Medicine

## 2022-10-27 ENCOUNTER — Emergency Department (HOSPITAL_COMMUNITY): Payer: 59

## 2022-10-27 ENCOUNTER — Other Ambulatory Visit: Payer: Self-pay

## 2022-10-27 ENCOUNTER — Encounter (HOSPITAL_COMMUNITY)
Admission: RE | Admit: 2022-10-27 | Discharge: 2022-10-27 | Disposition: A | Discharge status: Home / Self Care | Payer: 59 | Source: Ambulatory Visit | Attending: Cardiology | Admitting: Cardiology

## 2022-10-27 ENCOUNTER — Encounter (HOSPITAL_COMMUNITY): Payer: Self-pay

## 2022-10-27 ENCOUNTER — Emergency Department (HOSPITAL_COMMUNITY)
Admission: EM | Admit: 2022-10-27 | Discharge: 2022-10-27 | Disposition: A | Discharge status: Home / Self Care | Payer: 59 | Attending: Emergency Medicine | Admitting: Emergency Medicine

## 2022-10-27 DIAGNOSIS — X58XXXA Exposure to other specified factors, initial encounter: Secondary | ICD-10-CM | POA: Insufficient documentation

## 2022-10-27 DIAGNOSIS — E876 Hypokalemia: Secondary | ICD-10-CM | POA: Insufficient documentation

## 2022-10-27 DIAGNOSIS — Z4801 Encounter for change or removal of surgical wound dressing: Secondary | ICD-10-CM | POA: Diagnosis not present

## 2022-10-27 DIAGNOSIS — T827XXA Infection and inflammatory reaction due to other cardiac and vascular devices, implants and grafts, initial encounter: Secondary | ICD-10-CM | POA: Insufficient documentation

## 2022-10-27 DIAGNOSIS — Z95811 Presence of heart assist device: Secondary | ICD-10-CM

## 2022-10-27 DIAGNOSIS — R55 Syncope and collapse: Secondary | ICD-10-CM | POA: Diagnosis present

## 2022-10-27 DIAGNOSIS — Z7901 Long term (current) use of anticoagulants: Secondary | ICD-10-CM | POA: Diagnosis not present

## 2022-10-27 DIAGNOSIS — Z794 Long term (current) use of insulin: Secondary | ICD-10-CM | POA: Diagnosis not present

## 2022-10-27 DIAGNOSIS — R42 Dizziness and giddiness: Secondary | ICD-10-CM | POA: Insufficient documentation

## 2022-10-27 DIAGNOSIS — I493 Ventricular premature depolarization: Secondary | ICD-10-CM | POA: Diagnosis not present

## 2022-10-27 LAB — BASIC METABOLIC PANEL
Anion gap: 11 (ref 5–15)
BUN: 28 mg/dL — ABNORMAL HIGH (ref 6–20)
CO2: 24 mmol/L (ref 22–32)
Calcium: 8.8 mg/dL — ABNORMAL LOW (ref 8.9–10.3)
Chloride: 92 mmol/L — ABNORMAL LOW (ref 98–111)
Creatinine, Ser: 1.54 mg/dL — ABNORMAL HIGH (ref 0.61–1.24)
GFR, Estimated: 52 mL/min — ABNORMAL LOW (ref >60)
Glucose, Bld: 290 mg/dL — ABNORMAL HIGH (ref 70–99)
Potassium: 3.4 mmol/L — ABNORMAL LOW (ref 3.5–5.1)
Sodium: 127 mmol/L — ABNORMAL LOW (ref 135–145)

## 2022-10-27 LAB — URINALYSIS, ROUTINE W REFLEX MICROSCOPIC
Bacteria, UA: NONE SEEN
Bilirubin Urine: NEGATIVE
Glucose, UA: >=500 mg/dL — AB
Hgb urine dipstick: NEGATIVE
Ketones, ur: NEGATIVE mg/dL
Leukocytes,Ua: NEGATIVE
Nitrite: NEGATIVE
Protein, ur: NEGATIVE mg/dL
Specific Gravity, Urine: 1.02 (ref 1.005–1.030)
pH: 6 (ref 5.0–8.0)

## 2022-10-27 LAB — CBC WITH DIFFERENTIAL/PLATELET
Abs Immature Granulocytes: 0.03 10*3/uL (ref 0.00–0.07)
Basophils Absolute: 0.1 10*3/uL (ref 0.0–0.1)
Basophils Relative: 1 %
Eosinophils Absolute: 0.1 10*3/uL (ref 0.0–0.5)
Eosinophils Relative: 2 %
HCT: 36.8 % — ABNORMAL LOW (ref 39.0–52.0)
Hemoglobin: 10.9 g/dL — ABNORMAL LOW (ref 13.0–17.0)
Immature Granulocytes: 0 %
Lymphocytes Relative: 18 %
Lymphs Abs: 1.4 10*3/uL (ref 0.7–4.0)
MCH: 20.5 pg — ABNORMAL LOW (ref 26.0–34.0)
MCHC: 29.6 g/dL — ABNORMAL LOW (ref 30.0–36.0)
MCV: 69.3 fL — ABNORMAL LOW (ref 80.0–100.0)
Monocytes Absolute: 0.7 10*3/uL (ref 0.1–1.0)
Monocytes Relative: 8 %
Neutro Abs: 5.7 10*3/uL (ref 1.7–7.7)
Neutrophils Relative %: 71 %
Platelets: 449 10*3/uL — ABNORMAL HIGH (ref 150–400)
RBC: 5.31 MIL/uL (ref 4.22–5.81)
RDW: 24 % — ABNORMAL HIGH (ref 11.5–15.5)
WBC: 7.9 10*3/uL (ref 4.0–10.5)
nRBC: 0 % (ref 0.0–0.2)

## 2022-10-27 LAB — BRAIN NATRIURETIC PEPTIDE: B Natriuretic Peptide: 99 pg/mL (ref 0.0–100.0)

## 2022-10-27 LAB — PROTIME-INR
INR: 2 — ABNORMAL HIGH (ref 0.8–1.2)
Prothrombin Time: 22.6 seconds — ABNORMAL HIGH (ref 11.4–15.2)

## 2022-10-27 LAB — GLUCOSE, CAPILLARY: Glucose-Capillary: 313 mg/dL — ABNORMAL HIGH (ref 70–99)

## 2022-10-27 MED ORDER — POTASSIUM CHLORIDE CRYS ER 20 MEQ PO TBCR
40.0000 meq | EXTENDED_RELEASE_TABLET | Freq: Once | ORAL | Status: AC
  Administered 2022-10-27: 40 meq via ORAL
  Filled 2022-10-27: qty 2

## 2022-10-27 MED ORDER — CEFADROXIL 500 MG PO CAPS: 1000.0000 mg | ORAL_CAPSULE | Freq: Two times a day (BID) | ORAL | 0 refills | Status: AC

## 2022-10-27 MED ORDER — SODIUM CHLORIDE 0.9 % IV BOLUS
500.0000 mL | Freq: Once | INTRAVENOUS | Status: AC
  Administered 2022-10-27: 500 mL via INTRAVENOUS

## 2022-10-27 NOTE — Progress Notes (Signed)
Patient workup reviewed with Dr. Doretha Sou to discharge from VAD Team perspective. Dr. Orlena Sheldon to replace K+.  Bobbye Morton RN, BSN VAD Coordinator 24/7 Pager [phone]

## 2022-10-27 NOTE — Patient Instructions (Addendum)
Change drive line dressing twice weekly using daily kit. If drainage or pain worsens page VAD coordinator immediately- [phone] DO NOT SHOWER! Start Cefadroxil 1000 mg (2 tablets) twice a day for infection. You must pick this up from your pharmacy today Return to [facility] clinic in 1 week for drive line check

## 2022-10-27 NOTE — ED Triage Notes (Signed)
Pt here with near syncope after standing up from his car. Pt incontinent of urine. CBG 300's with cardiac rehab.

## 2022-10-27 NOTE — Discharge Instructions (Signed)
You were seen in the emergency department for an episode of dizziness.  Your lab work showed your potassium to be mildly low and you looked dehydrated.  Please keep hydrated and continue your regular medications.  Follow-up with your LVAD team.  Return to the emergency department if any worsening or concerning symptoms

## 2022-10-27 NOTE — Progress Notes (Signed)
Pt presents to VAD clinic today for drive line check and dressing change. Pt called VAD clinic yesterday reporting increased pain at exit site over the last 2 weeks.   Pt walked into clinic unassisted. States he has been feeling great, other than drive line pain. He has been changing his own dressing, despite being advised repeatedly to allow [name] to perform dressings or come to clinic for VAD coordinators to change dressing. Reports pain started at exit site two weeks ago. Abdomen is not tender to palpation. No areas of induration noted with palpation. He is changing his dressing once a week using daily kit. Reports last week he had an increased amount of thin yellow drainage. Today dressing with scant yellow drainage. Unable to express drainage from site. Unable to culture as drive line incorporated, with no tunneling present. Denies trauma to site. He denies that he is showering, but he thinks he admits dressing may have gotten wet while washing up and he did not change dressing. (He does not have a shower bag, nor has he been taught how to shower).   Discussed with Dr Haroldine Laws. Order received for Cefadroxil 1000 mg BID x 14 days. Prescription sent to pt's pharmacy. Instructed pt to pick up antibiotic and start taking today. Instructed him to page VAD coordinator if he had trouble picking up antibiotic, or his symptoms worsened over the weekend. He verbalized understanding.   Exit Site Care: Existing VAD dressing removed and site care performed using sterile technique by pt with VAD Coordinator observation with minimal cues. Drive line exit site cleaned with Chlora prep applicators x 2, allowed to dry, and gauze dressing. Exit site healed and incorporated, the velour is fully implanted at exit site. Redness, tenderness noted with cleansing, and scant yellow drainage noted on previous dressing. No foul odor or rash noted. Drive line anchor re-applied. Pt denies fever or chills.  Advised to allow [name]  to perform or assist to avoid infection, but pt states he will perform his own dressing change. Advised to change dressing twice a week as scant drainage today. Pt instructed to call VAD Coordinator if he notices increases drainage, redness, or tenderness at site. Pt given 7 daily dressing kits at today's visit.   Plan:  Change drive line dressing twice weekly using daily kit. If drainage or pain worsens page VAD coordinator immediately- [phone] DO NOT SHOWER! Start Cefadroxil 1000 mg (2 tablets) twice a day for infection. You must pick this up from your pharmacy today Return to [facility] clinic in 1 week for drive line check  Emerson Monte RN [place] Coordinator  Office: [phone]  24/7 Pager: [phone]

## 2022-10-27 NOTE — Progress Notes (Signed)
Incomplete Session Note  Patient Details  Name: Darryl Frye MRN: [id number] Date of Birth: Jan 28, 1961 Referring Provider:   [name] from 08/28/2022 in [place]  Referring Provider Dr. Salina April E Pew did not complete his rehab session.  On arrival to CR, Pt reports having a dizzy issue upon getting out of his car.  Reports being incont. of urine, pants are wet.  Pt requested to have glucose checked, CBG 313, MAP 88, Sats 98% on RA and HR 57.  Pt taken to ED room 8.  Placed on cardiac monitor, two IVs started, labs drawn and report given to [place], Therapist, sports.  Pt did not want to inform family at t his time.

## 2022-10-27 NOTE — Progress Notes (Signed)
Page received from Dr. Orlena Sheldon notifying of patient arrival to [facility] ED due to near syncope event. Patient stated he was on his way to Cardiac Rehab and got dizzy and felt as if he was going to pass out but did not lose consciousness. VAD parameters reviewed with patient's RN. Flow: 4.8 Speed: 5500 PI:3.2 Power:4.3. 500cc NS bolus ordered. Dr. Daniel Nones made aware.   Patient seen in [facility] today for pain at drive line site that has been ongoing for 2 weeks. Cefadroxil '500mg'$  BID added for 14 days.   Will continue to follow and assist as work up in being completed.  Bobbye Morton RN, BSN VAD Coordinator 24/7 Pager [phone]

## 2022-10-27 NOTE — ED Provider Notes (Signed)
[facility] Provider Note   CSN: [id number] Arrival date & time: 10/27/22  1451     History {Add pertinent medical, surgical, social history, OB history to HPI:1} Chief Complaint  Patient presents with   Near Syncope    Darryl Frye is a 62 y.o. male.  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  Follows at [facility].  He said he had just arrived here in the parking lot to go to cardiac rehab and when getting out of the car he felt acutely dizzy lightheaded like he might pass out.  He did not fully pass out or fall to the ground but leaned up against his car.  He said a lasted about 30 seconds.  He said he was incontinent of a little bit a urine.  There is no chest pain or shortness of breath.  No diaphoresis or nausea vomiting.  He was able to walk into cardiac rehab where he explained this and they sent him over to the emergency department for further evaluation.  He said he had other spells like this before but they have have not been this intense  The history is provided by the patient.  Near Syncope This is a new problem. The current episode started less than 1 hour ago. The problem has been resolved. Pertinent negatives include no chest pain, no abdominal pain, no headaches and no shortness of breath. The symptoms are aggravated by standing. The symptoms are relieved by rest. He has tried rest for the symptoms. The treatment provided significant relief.       Home Medications Prior to Admission medications   Medication Sig Start Date End Date Taking? Authorizing Provider  acetaminophen (TYLENOL) 325 MG tablet Take 1-2 tablets (325-650 mg total) by mouth every 4 (four) hours as needed for mild pain. Patient not taking: Reported on 10/10/2022 07/31/22   Barbie Banner, PA-C  atorvastatin (LIPITOR) 20 MG tablet Take 1 tablet (20 mg total) by mouth daily. 08/17/22   Larey Dresser, MD  Calcium Carb-Cholecalciferol 600-10  MG-MCG TABS Take 1 tablet by mouth daily with breakfast. 08/17/22   Larey Dresser, MD  cefadroxil (DURICEF) 500 MG capsule Take 2 capsules (1,000 mg total) by mouth 2 (two) times daily for 14 days. 10/27/22 11/10/22  Bensimhon, Shaune Pascal, MD  Continuous Blood Gluc Receiver (FREESTYLE LIBRE 3 READER) DEVI 1 each by Does not apply route daily. 09/12/22   Raulkar, Clide Deutscher, MD  Continuous Blood Gluc Sensor (FREESTYLE LIBRE 3 SENSOR) MISC 1 each by Does not apply route daily. Place 1 sensor on the skin every 14 days. Use to check glucose continuously 09/12/22   Raulkar, Clide Deutscher, MD  digoxin (LANOXIN) 0.125 MG tablet Take 0.5 tablets (0.0625 mg total) by mouth daily. 08/17/22   Larey Dresser, MD  empagliflozin (JARDIANCE) 10 MG TABS tablet Take 1 tablet (10 mg total) by mouth daily. 08/17/22   Larey Dresser, MD  enoxaparin (LOVENOX) 40 MG/0.4ML injection Inject 0.4 mLs (40 mg total) into the skin every 12 (twelve) hours. Patient not taking: Reported on 10/10/2022 09/26/22   Larey Dresser, MD  Fe Fum-Vit C-Vit B12-FA (TRIGELS-F FORTE) CAPS capsule Take 1 capsule by mouth daily after breakfast. Patient not taking: Reported on 10/10/2022 08/17/22   Larey Dresser, MD  insulin glargine (SEMGLEE, YFGN,) 100 UNIT/ML Solostar Pen Inject 22 Units into the skin at bedtime. 07/31/22   Risa Grill  J, PA-C  insulin glargine-yfgn (SEMGLEE) 100 UNIT/ML injection Inject 0.13 mLs (13 Units total) into the skin daily. 08/01/22   Setzer, Edman Circle, PA-C  insulin lispro (HUMALOG KWIKPEN) 100 UNIT/ML KwikPen Use 4 units three times a day with meals 08/28/22   Larey Dresser, MD  Insulin Pen Needle (PEN NEEDLES) 32G X 4 MM MISC Use with insulin pen with breakfast, with lunch, and with evening meal. 07/31/22   Setzer, Edman Circle, PA-C  levothyroxine (SYNTHROID) 25 MCG tablet Take 1 tablet (25 mcg total) by mouth daily before breakfast. 08/17/22   Larey Dresser, MD  magnesium oxide (MAG-OX) 400 (240 Mg) MG tablet Take  1 tablet (400 mg total) by mouth 2 (two) times daily. 08/17/22   Larey Dresser, MD  Multiple Vitamin (MULTIVITAMIN WITH MINERALS) TABS tablet Take 1 tablet by mouth daily. 07/31/22   Setzer, Edman Circle, PA-C  pantoprazole (PROTONIX) 40 MG tablet Take 1 tablet (40 mg total) by mouth daily. 08/17/22   Larey Dresser, MD  potassium chloride SA (KLOR-CON M) 20 MEQ tablet Take 2 tablets (40 mEq total) by mouth daily. 08/17/22   Larey Dresser, MD  sertraline (ZOLOFT) 50 MG tablet Take 1 tablet (50 mg total) by mouth daily. 08/17/22   Larey Dresser, MD  sildenafil (REVATIO) 20 MG tablet Take 1 tablet (20 mg total) by mouth 3 (three) times daily. 08/17/22   Larey Dresser, MD  spironolactone (ALDACTONE) 25 MG tablet Take 1 tablet (25 mg total) by mouth daily. 08/17/22   Larey Dresser, MD  tiotropium (SPIRIVA HANDIHALER) 18 MCG inhalation capsule Place 1 capsule (18 mcg total) into inhaler and inhale daily. 10/10/22   Larey Dresser, MD  torsemide (DEMADEX) 20 MG tablet Take 4 tablets (80 mg total) by mouth daily. Take 80 mg (4 pills) alternating with 60 mg (3 pills) 10/10/22   Larey Dresser, MD  warfarin (COUMADIN) 3 MG tablet Take 1 tablet (3 mg total) by mouth daily at 4 PM. Take 1 tablet (3 mg) daily, or as directed by heart failure clinic 08/17/22   Larey Dresser, MD      Allergies    Patient has no known allergies.    Review of Systems   Review of Systems  Constitutional:  Negative for fever.  HENT:  Negative for sore throat.   Eyes:  Negative for visual disturbance.  Respiratory:  Negative for shortness of breath.   Cardiovascular:  Positive for near-syncope. Negative for chest pain.  Gastrointestinal:  Negative for abdominal pain.  Genitourinary:  Negative for dysuria.  Neurological:  Positive for dizziness and light-headedness. Negative for headaches.    Physical Exam Updated Vital Signs BP 98/86   Pulse 84   Resp (!) 23   SpO2 95%  Physical Exam Vitals and  nursing note reviewed.  Constitutional:      General: He is not in acute distress.    Appearance: Normal appearance. He is well-developed.  HENT:     Head: Normocephalic and atraumatic.  Eyes:     Conjunctiva/sclera: Conjunctivae normal.  Cardiovascular:     Heart sounds: No murmur heard. Pulmonary:     Effort: Pulmonary effort is normal. No respiratory distress.     Breath sounds: Normal breath sounds.  Abdominal:     Palpations: Abdomen is soft.     Tenderness: There is no abdominal tenderness.  Musculoskeletal:        General: No deformity.     Cervical  back: Neck supple.  Skin:    General: Skin is warm and dry.     Capillary Refill: Capillary refill takes less than 2 seconds.  Neurological:     General: No focal deficit present.     Mental Status: He is alert.     ED Results / Procedures / Treatments   Labs (all labs ordered are listed, but only abnormal results are displayed) Labs Reviewed  BASIC METABOLIC PANEL  CBC WITH DIFFERENTIAL/PLATELET    EKG None  Radiology No results found.  Procedures Procedures  {Document cardiac monitor, telemetry assessment procedure when appropriate:1}  Medications Ordered in ED Medications - No data to display  ED Course/ Medical Decision Making/ A&P   {   Click here for ABCD2, HEART and other calculatorsREFRESH Note before signing :1}                          Medical Decision Making Amount and/or Complexity of Data Reviewed Labs: ordered. Radiology: ordered.   This patient complains of ***; this involves an extensive number of treatment Options and is a complaint that carries with it a high risk of complications and morbidity. The differential includes ***  I ordered, reviewed and interpreted labs, which included *** I ordered medication *** and reviewed PMP when indicated. I ordered imaging studies which included *** and I independently    visualized and interpreted imaging which showed *** Additional history  obtained from *** Previous records obtained and reviewed *** I consulted *** and discussed lab and imaging findings and discussed disposition.  Cardiac monitoring reviewed, *** Social determinants considered, *** Critical Interventions: ***  After the interventions stated above, I reevaluated the patient and found *** Admission and further testing considered, ***   {Document critical care time when appropriate:1} {Document review of labs and clinical decision tools ie heart score, Chads2Vasc2 etc:1}  {Document your independent review of radiology images, and any outside records:1} {Document your discussion with family members, caretakers, and with consultants:1} {Document social determinants of health affecting pt's care:1} {Document your decision making why or why not admission, treatments were needed:1} Final Clinical Impression(s) / ED Diagnoses Final diagnoses:  None    Rx / DC Orders ED Discharge Orders     None

## 2022-10-30 ENCOUNTER — Encounter (HOSPITAL_COMMUNITY)
Admission: RE | Admit: 2022-10-30 | Discharge: 2022-10-30 | Disposition: A | Discharge status: Home / Self Care | Payer: 59 | Source: Ambulatory Visit | Attending: Cardiology | Admitting: Cardiology

## 2022-10-30 VITALS — Wt 203.0 lb

## 2022-10-30 DIAGNOSIS — Z95811 Presence of heart assist device: Secondary | ICD-10-CM

## 2022-10-30 DIAGNOSIS — I5042 Chronic combined systolic (congestive) and diastolic (congestive) heart failure: Secondary | ICD-10-CM | POA: Diagnosis not present

## 2022-10-30 NOTE — Progress Notes (Signed)
Daily Session Note  Patient Details  Name: Darryl Frye MRN: [id number] Date of Birth: 01-13-1961 Referring Provider:   [name] from 08/28/2022 in [place]  Referring Provider Dr. Tenny Craw       Encounter Date: 10/30/2022  Check In:  Session Check In - 10/30/22 1436       Check-In   Supervising physician immediately available to respond to emergencies [facility] MD immediately available    Physician(s) Dr. Dellia Cloud    Location [facility]    Staff Present Leana Roe, BS, Exercise Physiologist;Daphyne Hassell Done, RN, BSN    Virtual Visit No    Medication changes reported     No    Fall or balance concerns reported    Yes    Comments He has not fallen, but he does use a rollator due to weakness.    Tobacco Cessation No Change    Warm-up and Cool-down Performed as group-led instruction    Resistance Training Performed Yes    VAD Patient? Yes    PAD/SET Patient? No      VAD patient   Has back up controller? Yes    Has spare charged batteries? Yes    Has battery cables? Yes    Has compatible battery clips? Yes      Pain Assessment   Currently in Pain? No/denies    Pain Score 0-No pain    Multiple Pain Sites No             Capillary Blood Glucose: No results found for this or any previous visit (from the past 24 hour(s)).    Social History   Tobacco Use  Smoking Status Former   Packs/day: 1.00   Years: 20.00   Total pack years: 20.00   Types: Cigarettes   Quit date: 07/07/2019   Years since quitting: 3.3  Smokeless Tobacco Never    Goals Met:  Independence with exercise equipment Exercise tolerated well No report of concerns or symptoms today Strength training completed today  Goals Unmet:  Not Applicable  Comments: check out 1545   Dr. Carlyle Dolly is Medical Director for [facility]

## 2022-11-01 ENCOUNTER — Encounter (HOSPITAL_COMMUNITY)
Admission: RE | Admit: 2022-11-01 | Discharge: 2022-11-01 | Disposition: A | Discharge status: Home / Self Care | Payer: 59 | Source: Ambulatory Visit | Attending: Cardiology | Admitting: Cardiology

## 2022-11-01 DIAGNOSIS — I5042 Chronic combined systolic (congestive) and diastolic (congestive) heart failure: Secondary | ICD-10-CM

## 2022-11-01 DIAGNOSIS — Z95811 Presence of heart assist device: Secondary | ICD-10-CM

## 2022-11-01 NOTE — Progress Notes (Signed)
Daily Session Note  Patient Details  Name: Darryl Frye MRN: [id number] Date of Birth: July 14, 1961 Referring Provider:   [name] from 08/28/2022 in [place]  Referring Provider Dr. Tenny Craw       Encounter Date: 11/01/2022  Check In:  Session Check In - 11/01/22 1427       Check-In   Supervising physician immediately available to respond to emergencies [facility] MD immediately available    Physician(s) Dr. Dellia Cloud    Location [facility]    Staff Present Leana Roe, BS, Exercise Physiologist;Debra Wynetta Emery, RN, BSN    Virtual Visit No    Medication changes reported     No    Fall or balance concerns reported    Yes    Comments He has not fallen, but he does use a rollator due to weakness.    Tobacco Cessation No Change    Warm-up and Cool-down Performed as group-led instruction    Resistance Training Performed Yes    VAD Patient? Yes    PAD/SET Patient? No      VAD patient   Has back up controller? Yes    Has spare charged batteries? Yes    Has battery cables? Yes    Has compatible battery clips? Yes      Pain Assessment   Currently in Pain? No/denies    Pain Score 0-No pain    Multiple Pain Sites No             Capillary Blood Glucose: No results found for this or any previous visit (from the past 24 hour(s)).    Social History   Tobacco Use  Smoking Status Former   Packs/day: 1.00   Years: 20.00   Total pack years: 20.00   Types: Cigarettes   Quit date: 07/07/2019   Years since quitting: 3.3  Smokeless Tobacco Never    Goals Met:  Independence with exercise equipment Exercise tolerated well No report of concerns or symptoms today Strength training completed today  Goals Unmet:  Not Applicable  Comments: check out 1545   Dr. Carlyle Dolly is Medical Director for [facility]

## 2022-11-02 ENCOUNTER — Other Ambulatory Visit (HOSPITAL_COMMUNITY): Payer: Self-pay

## 2022-11-02 DIAGNOSIS — Z7901 Long term (current) use of anticoagulants: Secondary | ICD-10-CM

## 2022-11-02 DIAGNOSIS — Z95811 Presence of heart assist device: Secondary | ICD-10-CM

## 2022-11-03 ENCOUNTER — Ambulatory Visit (HOSPITAL_COMMUNITY)
Admission: RE | Admit: 2022-11-03 | Discharge: 2022-11-03 | Disposition: A | Discharge status: Home / Self Care | Payer: 59 | Source: Ambulatory Visit | Attending: Internal Medicine | Admitting: Internal Medicine

## 2022-11-03 ENCOUNTER — Encounter (HOSPITAL_COMMUNITY)
Admission: RE | Admit: 2022-11-03 | Discharge: 2022-11-03 | Disposition: A | Discharge status: Home / Self Care | Payer: 59 | Source: Ambulatory Visit | Attending: Cardiology | Admitting: Cardiology

## 2022-11-03 DIAGNOSIS — Z4801 Encounter for change or removal of surgical wound dressing: Secondary | ICD-10-CM | POA: Insufficient documentation

## 2022-11-03 DIAGNOSIS — I5042 Chronic combined systolic (congestive) and diastolic (congestive) heart failure: Secondary | ICD-10-CM | POA: Diagnosis not present

## 2022-11-03 DIAGNOSIS — Z95811 Presence of heart assist device: Secondary | ICD-10-CM

## 2022-11-03 DIAGNOSIS — T827XXA Infection and inflammatory reaction due to other cardiac and vascular devices, implants and grafts, initial encounter: Secondary | ICD-10-CM

## 2022-11-03 NOTE — Progress Notes (Signed)
Daily Session Note  Patient Details  Name: Darryl Frye MRN: [id number] Date of Birth: 1960/09/09 Referring Provider:   [name] from 08/28/2022 in [place]  Referring Provider Dr. Tenny Craw       Encounter Date: 11/03/2022  Check In:  Session Check In - 11/03/22 1407       Check-In   Supervising physician immediately available to respond to emergencies [facility] MD immediately available    Physician(s) Dr. Dellia Cloud    Location [facility]    Staff Present Wash Nienhaus Hassell Done, RN, BSN;[place] Sherrie George, MS, ACSM-CEP    Virtual Visit No    Medication changes reported     No    Comments Increased Torsemide 80mg  (4 tabs) alternating 60mg  (3 tabs)    Fall or balance concerns reported    Yes    Comments He has not fallen, but he does use a rollator due to weakness.    Tobacco Cessation No Change    Warm-up and Cool-down Performed as group-led instruction    Resistance Training Performed Yes    VAD Patient? Yes      VAD patient   Has back up controller? Yes    Has spare charged batteries? Yes    Has battery cables? Yes    Has compatible battery clips? Yes      Pain Assessment   Currently in Pain? No/denies    Pain Score 0-No pain    Multiple Pain Sites No             Capillary Blood Glucose: No results found for this or any previous visit (from the past 24 hour(s)).    Social History   Tobacco Use  Smoking Status Former   Packs/day: 1.00   Years: 20.00   Additional pack years: 0.00   Total pack years: 20.00   Types: Cigarettes   Quit date: 07/07/2019   Years since quitting: 3.3  Smokeless Tobacco Never    Goals Met:  Independence with exercise equipment Exercise tolerated well No report of concerns or symptoms today Strength training completed today  Goals Unmet:  Not Applicable  Comments: Checkout at 1545.   Dr. Carlyle Dolly is Medical Director for [facility]

## 2022-11-03 NOTE — Progress Notes (Signed)
Pt presents to VAD clinic today for drive line check and dressing change.   Pt reports he is taking Cefadroxil 1000 mg BID. He states he has not missed any doses. States tenderness has greatly improved since antibiotic start.    Exit Site Care: Existing VAD dressing removed and site care performed using sterile technique by pt with VAD Coordinator observation with minimal cues. Drive line exit site cleaned with Chlora prep applicators x 2, allowed to dry, and gauze dressing. Exit site healed and incorporated, the velour is fully implanted at exit site. Redness, burning noted with cleansing, no tenderness, and scant yellow drainage noted on previous dressing. No foul odor or rash noted. Drive line anchor re-applied. Pt denies fever or chills.  Advised to allow [name] to perform or assist to avoid infection, but pt states he will perform his own dressing change. Advised to continue changing dressing twice a week as scant drainage today. Pt instructed to call VAD Coordinator if he notices increases drainage, redness, or tenderness at site. Pt has adequate dressing supplies at home.    Plan:  Change drive line dressing twice weekly using daily kit. If drainage or pain worsens page VAD coordinator immediately- [phone] DO NOT SHOWER! Continue Cefadroxil 1000 mg (2 tablets) twice a day for infection.  Return to [facility] clinic in 1 week for drive line check  Emerson Monte RN [facility] Coordinator  Office: [phone]  24/7 Pager: [phone]

## 2022-11-06 ENCOUNTER — Encounter (HOSPITAL_COMMUNITY)
Admission: RE | Admit: 2022-11-06 | Discharge: 2022-11-06 | Disposition: A | Discharge status: Home / Self Care | Payer: 59 | Source: Ambulatory Visit | Attending: Cardiology | Admitting: Cardiology

## 2022-11-06 ENCOUNTER — Ambulatory Visit (INDEPENDENT_AMBULATORY_CARE_PROVIDER_SITE_OTHER): Payer: 59

## 2022-11-06 DIAGNOSIS — I5042 Chronic combined systolic (congestive) and diastolic (congestive) heart failure: Secondary | ICD-10-CM

## 2022-11-06 DIAGNOSIS — I509 Heart failure, unspecified: Secondary | ICD-10-CM | POA: Diagnosis not present

## 2022-11-06 DIAGNOSIS — Z95811 Presence of heart assist device: Secondary | ICD-10-CM

## 2022-11-06 NOTE — Progress Notes (Signed)
Daily Session Note  Patient Details  Name: Darryl Frye MRN: [id number] Date of Birth: 1961/05/28 Referring Provider:   [name] from 08/28/2022 in [place]  Referring Provider Dr. Tenny Craw       Encounter Date: 11/06/2022  Check In:  Session Check In - 11/06/22 1446       Check-In   Supervising physician immediately available to respond to emergencies [facility] immediately available    Physician(s) Dr. Dellia Cloud    Location [facility]    Staff Present Tamla Winkels Hassell Done, RN, BSN;Heather Mel Almond, BS, Exercise Physiologist    Virtual Visit No    Medication changes reported     No    Comments Increased Torsemide 80mg  (4 tabs) alternating 60mg  (3 tabs)    Comments He has not fallen, but he does use a rollator due to weakness.    Tobacco Cessation No Change    Warm-up and Cool-down Performed as group-led instruction    Resistance Training Performed Yes    VAD Patient? Yes    PAD/SET Patient? No      VAD patient   Has back up controller? Yes    Has spare charged batteries? Yes    Has battery cables? Yes    Has compatible battery clips? Yes      Pain Assessment   Currently in Pain? No/denies    Pain Score 0-No pain    Multiple Pain Sites No             Capillary Blood Glucose: No results found for this or any previous visit (from the past 24 hour(s)).    Social History   Tobacco Use  Smoking Status Former   Packs/day: 1.00   Years: 20.00   Additional pack years: 0.00   Total pack years: 20.00   Types: Cigarettes   Quit date: 07/07/2019   Years since quitting: 3.3  Smokeless Tobacco Never    Goals Met:  Independence with exercise equipment Exercise tolerated well No report of concerns or symptoms today Strength training completed today  Goals Unmet:  Not Applicable  Comments: Checkout at 1545.   Dr. Carlyle Dolly is Medical Director for [facility]

## 2022-11-07 ENCOUNTER — Other Ambulatory Visit (HOSPITAL_COMMUNITY): Payer: Self-pay

## 2022-11-07 DIAGNOSIS — Z95811 Presence of heart assist device: Secondary | ICD-10-CM

## 2022-11-07 DIAGNOSIS — Z7901 Long term (current) use of anticoagulants: Secondary | ICD-10-CM

## 2022-11-08 ENCOUNTER — Encounter (HOSPITAL_COMMUNITY)
Admission: RE | Admit: 2022-11-08 | Discharge: 2022-11-08 | Disposition: A | Discharge status: Home / Self Care | Payer: 59 | Source: Ambulatory Visit | Attending: Cardiology | Admitting: Cardiology

## 2022-11-08 DIAGNOSIS — I5042 Chronic combined systolic (congestive) and diastolic (congestive) heart failure: Secondary | ICD-10-CM

## 2022-11-08 DIAGNOSIS — Z95811 Presence of heart assist device: Secondary | ICD-10-CM

## 2022-11-08 LAB — CUP PACEART REMOTE DEVICE CHECK
Battery Remaining Longevity: 103 mo
Battery Voltage: 2.99 V
Brady Statistic AP VP Percent: 0 %
Brady Statistic AP VS Percent: 0.01 %
Brady Statistic AS VP Percent: 0 %
Brady Statistic AS VS Percent: 99.99 %
Brady Statistic RA Percent Paced: 0.01 %
Brady Statistic RV Percent Paced: 0 %
Date Time Interrogation Session: 20240318185751
HighPow Impedance: 69 Ohm
Implantable Lead Connection Status: 753985
Implantable Lead Implant Date: 20210929
Implantable Lead Location: 753860
Implantable Pulse Generator Implant Date: 20210929
Lead Channel Impedance Value: 342 Ohm
Lead Channel Impedance Value: 418 Ohm
Lead Channel Impedance Value: 456 Ohm
Lead Channel Pacing Threshold Amplitude: 0.625 V
Lead Channel Pacing Threshold Amplitude: 2.5 V
Lead Channel Pacing Threshold Pulse Width: 0.4 ms
Lead Channel Pacing Threshold Pulse Width: 0.4 ms
Lead Channel Sensing Intrinsic Amplitude: 4.5 mV
Lead Channel Sensing Intrinsic Amplitude: 4.5 mV
Lead Channel Sensing Intrinsic Amplitude: 5.625 mV
Lead Channel Sensing Intrinsic Amplitude: 5.625 mV
Lead Channel Setting Pacing Amplitude: 1.5 V
Lead Channel Setting Pacing Amplitude: 5 V
Lead Channel Setting Pacing Pulse Width: 1 ms
Lead Channel Setting Sensing Sensitivity: 0.3 mV
Zone Setting Status: 755011
Zone Setting Status: 755011

## 2022-11-08 NOTE — Progress Notes (Signed)
Daily Session Note  Patient Details  Name: Darryl Frye MRN: [id number] Date of Birth: 1961-01-03 Referring Provider:   [name] from 08/28/2022 in [place]  Referring Provider Dr. Tenny Craw       Encounter Date: 11/08/2022  Check In:  Session Check In - 11/08/22 1443       Check-In   Supervising physician immediately available to respond to emergencies [facility] MD immediately available    Physician(s) Dr. Gasper Sells    Location [facility]    Staff Present Hoy Register MHA, MS, ACSM-CEP;Melven Sartorius BSN, RN    Virtual Visit No    Medication changes reported     No    Comments Increased Torsemide 80mg  (4 tabs) alternating 60mg  (3 tabs)    Fall or balance concerns reported    Yes    Comments He has not fallen, but he does use a rollator due to weakness.    Tobacco Cessation No Change    Warm-up and Cool-down Performed as group-led instruction    Resistance Training Performed Yes    VAD Patient? Yes    PAD/SET Patient? No      VAD patient   Has back up controller? Yes    Has spare charged batteries? Yes    Has battery cables? Yes    Has compatible battery clips? Yes      Pain Assessment   Currently in Pain? No/denies    Pain Score 0-No pain    Multiple Pain Sites No             Capillary Blood Glucose: No results found for this or any previous visit (from the past 24 hour(s)).    Social History   Tobacco Use  Smoking Status Former   Packs/day: 1.00   Years: 20.00   Additional pack years: 0.00   Total pack years: 20.00   Types: Cigarettes   Quit date: 07/07/2019   Years since quitting: 3.3  Smokeless Tobacco Never    Goals Met:  Independence with exercise equipment Exercise tolerated well No report of concerns or symptoms today Strength training completed today  Goals Unmet:  Not Applicable  Comments: checkout time is 1545   Dr. Carlyle Dolly is Medical  Director for [facility]

## 2022-11-09 ENCOUNTER — Ambulatory Visit (HOSPITAL_COMMUNITY)
Admission: RE | Admit: 2022-11-09 | Discharge: 2022-11-09 | Disposition: A | Discharge status: Home / Self Care | Payer: 59 | Source: Ambulatory Visit | Attending: Cardiology | Admitting: Cardiology

## 2022-11-09 ENCOUNTER — Ambulatory Visit (HOSPITAL_COMMUNITY): Payer: Self-pay | Admitting: Pharmacist

## 2022-11-09 DIAGNOSIS — I509 Heart failure, unspecified: Secondary | ICD-10-CM

## 2022-11-09 DIAGNOSIS — Z95811 Presence of heart assist device: Secondary | ICD-10-CM | POA: Diagnosis present

## 2022-11-09 DIAGNOSIS — Z7901 Long term (current) use of anticoagulants: Secondary | ICD-10-CM

## 2022-11-09 LAB — PROTIME-INR
INR: 1.5 — ABNORMAL HIGH (ref 0.8–1.2)
Prothrombin Time: 17.6 seconds — ABNORMAL HIGH (ref 11.4–15.2)

## 2022-11-09 IMAGING — CT CT CHEST W/ CM
2 of 3 series · 15 of 36 positions shown, 18 images · IV contrast (Omnipaque or Isovue)
Comparison: Chest radiograph 12/12/2019

CLINICAL DATA: Follow-up lung nodule, pneumonia 7479, history of
hypertension, diabetes, and CHF

EXAM:
CT CHEST WITH CONTRAST
TECHNIQUE: Multidetector CT imaging of the chest was performed during
intravenous contrast administration.
CONTRAST:  75mL OMNIPAQUE IOHEXOL 300 MG/ML  SOLN

[Series 2: routine chest with · axial · 0.71mm/px · z∈[-41,+233]mm · 12 of 161 slices shown, 15 images]
[im 12/161  mediastinal]
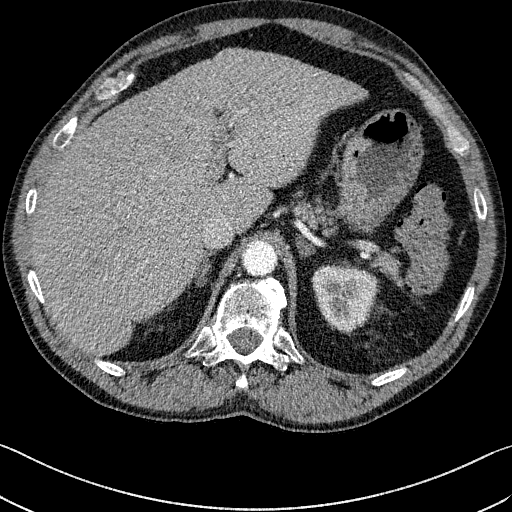
[im 12/161  lung]
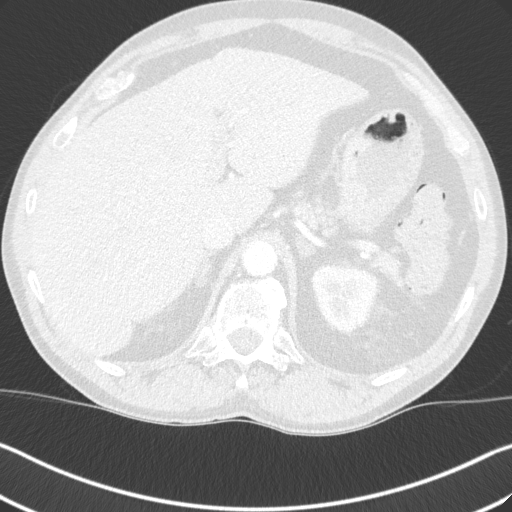
[im 24/161  lung]
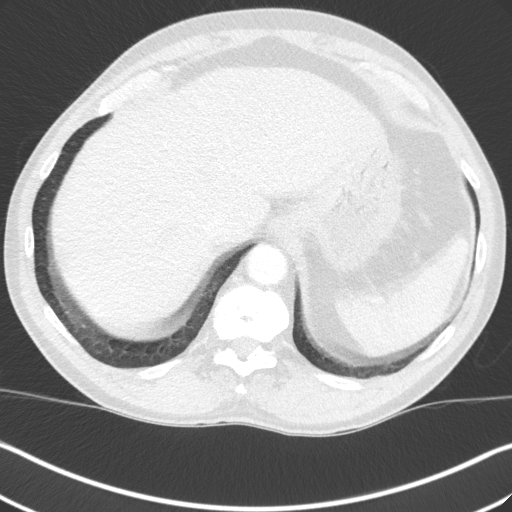
[im 36/161  lung]
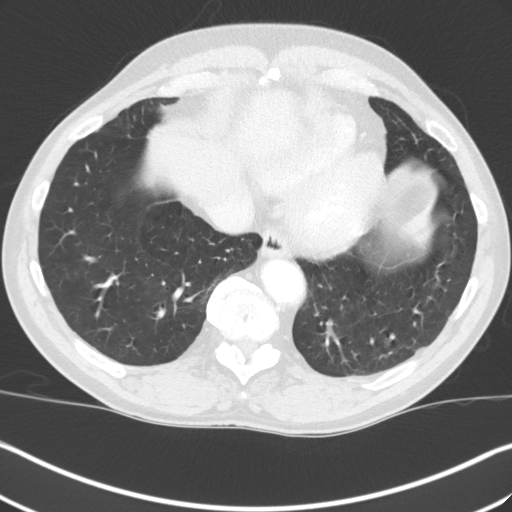
[im 48/161  lung]
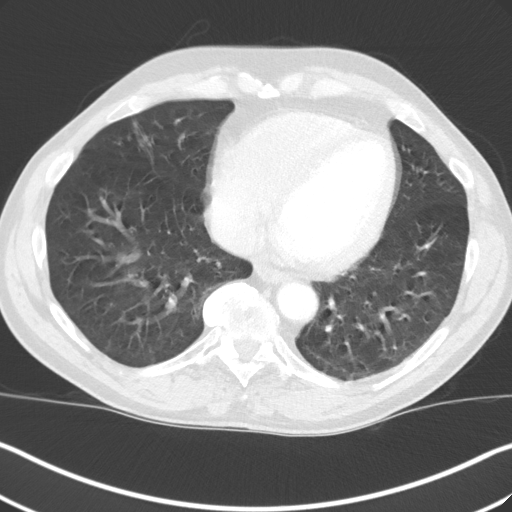
[im 60/161  mediastinal]
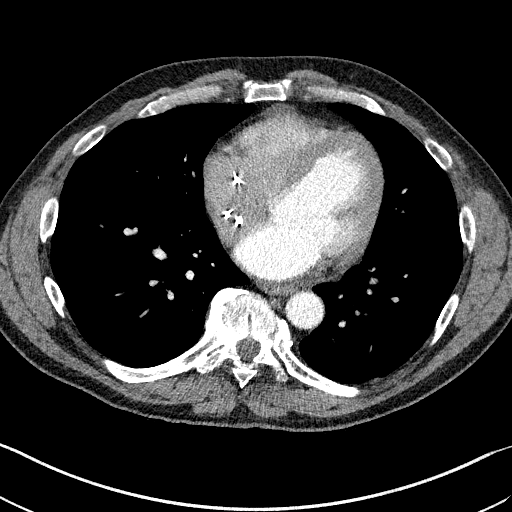
[im 60/161  lung]
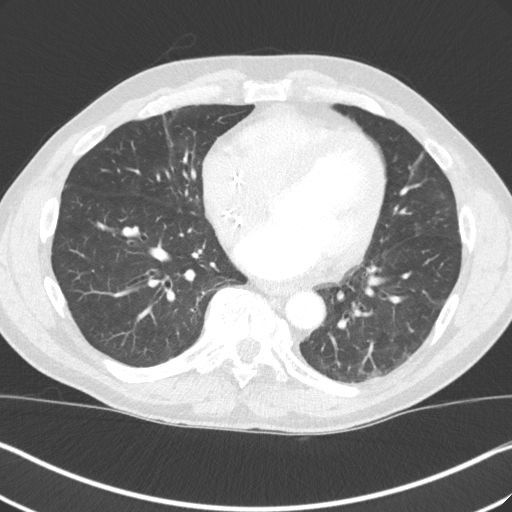
[im 72/161  lung]
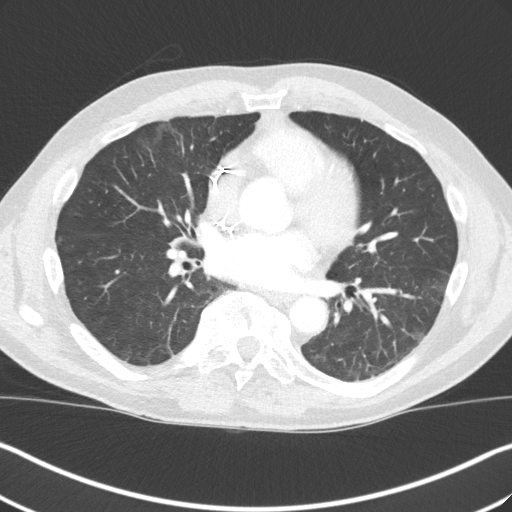
[im 89/161  lung]
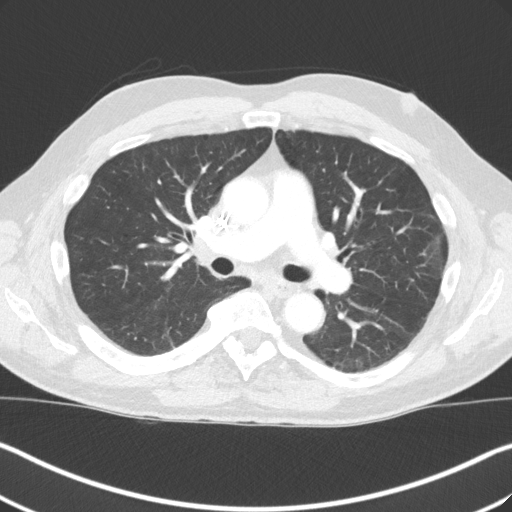
[im 101/161  lung]
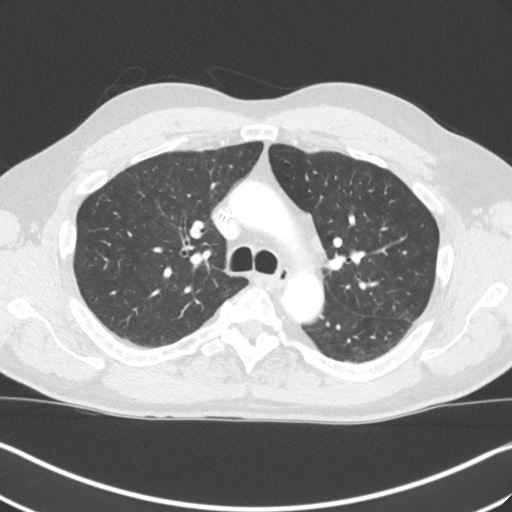
[im 113/161  mediastinal]
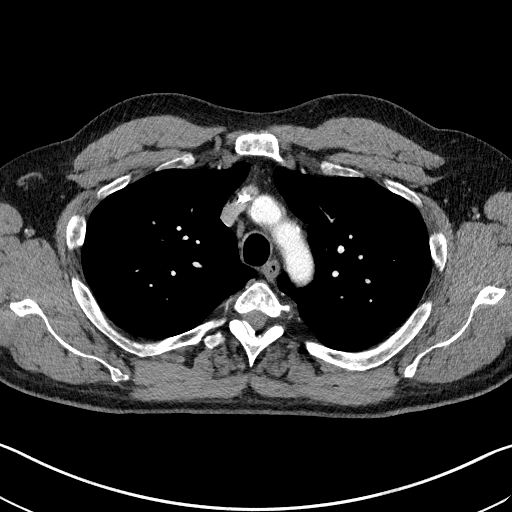
[im 113/161  lung]
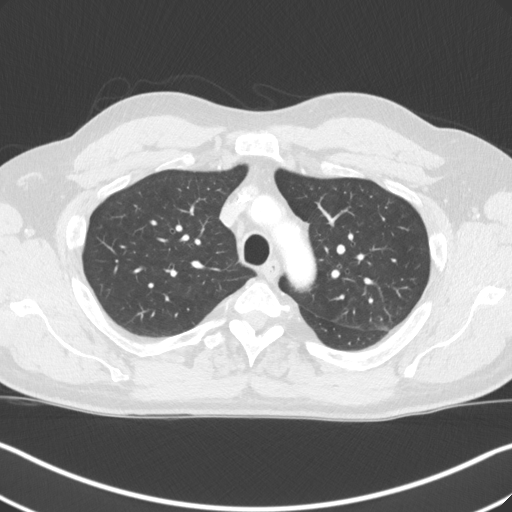
[im 125/161  lung]
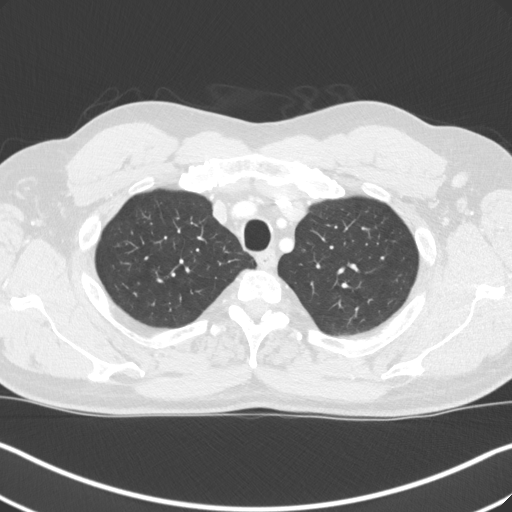
[im 137/161  lung]
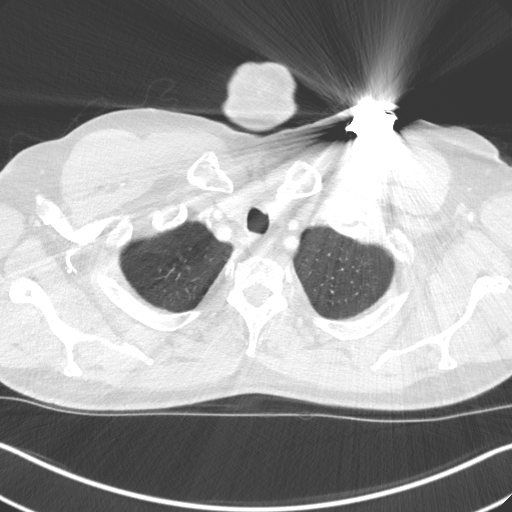
[im 149/161  lung]
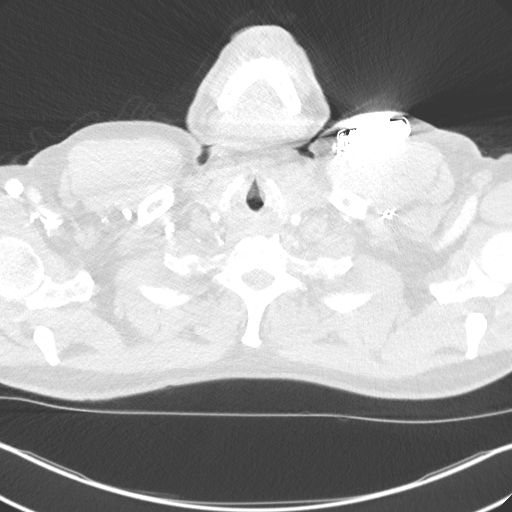

[Series 5: coronal · coronal · 0.66mm/px · 3 of 134 slices shown]
[im 27/134  lung]
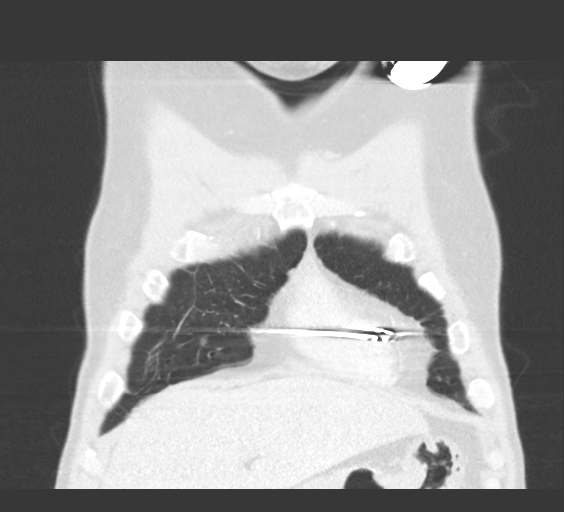
[im 54/134  lung]
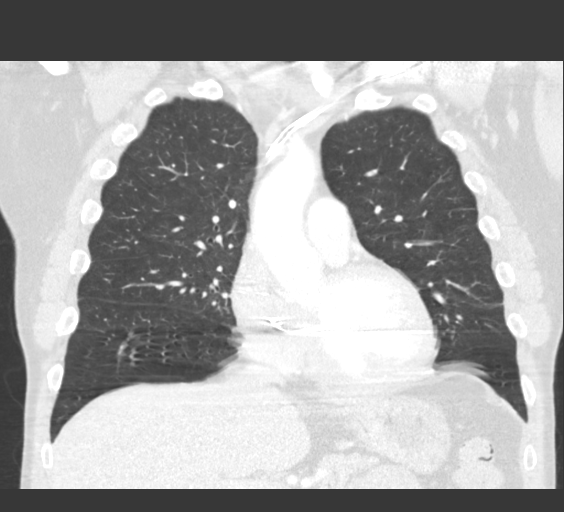
[im 80/134  lung]
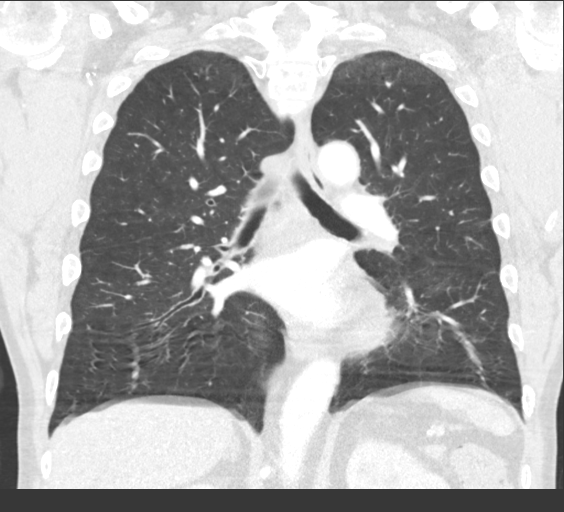

[15 of 36 positions shown; findings below may reference images not displayed]

FINDINGS: Cardiovascular: There is a dual lead pacemaker/AICD with leads in
the right atrium and right ventricle.

Mediastinum/Nodes: No enlarged mediastinal or axillary lymph nodes.
Thyroid gland, trachea, and esophagus demonstrate no significant
findings.

Lungs/Pleura: The central airways are patent. There is no focal
consolidation. There is scattered parenchymal scarring bilaterally.
No suspicious pulmonary nodules or masses. No pleural effusion or
pneumothorax.

Upper Abdomen: No acute abnormality. Left adrenal thickening without
discrete nodule.

Musculoskeletal: No chest wall mass or suspicious bone lesions
identified. Thoracic spondylosis.
IMPRESSION: No suspicious pulmonary nodules or masses.

Scattered lung scarring consistent with prior
infectious/inflammatory process. No acute findings in the chest.

## 2022-11-09 MED ORDER — WARFARIN SODIUM 3 MG PO TABS: ORAL_TABLET | ORAL | 3 refills | Status: DC

## 2022-11-09 NOTE — Progress Notes (Signed)
Pt presents to VAD clinic today for drive line check and dressing change.   Pt reports he is taking Cefadroxil 1000 mg BID. He states he has not missed any doses. Targeted end date tomorrow 11/10/22. Pt states tenderness has greatly improved since antibiotic start.    Exit Site Care: Existing VAD dressing removed and site care performed using sterile technique by pt with VAD Coordinator observation with minimal cues. Drive line exit site cleaned with Chlora prep applicators x 2, allowed to dry, and gauze dressing. Exit site healed and incorporated, the velour is fully implanted at exit site. Redness, burning noted with cleansing, no tenderness, and scant yellow drainage noted on previous dressing. No foul odor or rash noted. Drive line anchor re-applied. Pt denies fever or chills.  Advised to allow [name] to perform or assist to avoid infection, but pt states he will perform his own dressing change. Advised to continue changing dressing twice a week as scant drainage today. Pt instructed to call VAD Coordinator if he notices increases drainage, redness, or tenderness at site. Pt has adequate dressing supplies at home.      Plan:  Change drive line dressing twice weekly using daily kit. If drainage or pain worsens page VAD coordinator immediately- [phone] DO NOT SHOWER! Continue Cefadroxil 1000 mg (2 tablets) twice a day for infection.  Return to [place] clinic in 1 week for drive line check  Bobbye Morton RN,BSN [place] Coordinator  Office: [phone]  24/7 Pager: [phone]

## 2022-11-10 ENCOUNTER — Other Ambulatory Visit (HOSPITAL_COMMUNITY): Payer: Self-pay | Admitting: Unknown Physician Specialty

## 2022-11-10 ENCOUNTER — Encounter (HOSPITAL_COMMUNITY)
Admission: RE | Admit: 2022-11-10 | Discharge: 2022-11-10 | Disposition: A | Discharge status: Home / Self Care | Payer: 59 | Source: Ambulatory Visit | Attending: Cardiology | Admitting: Cardiology

## 2022-11-10 DIAGNOSIS — Z95811 Presence of heart assist device: Secondary | ICD-10-CM

## 2022-11-10 DIAGNOSIS — Z7901 Long term (current) use of anticoagulants: Secondary | ICD-10-CM

## 2022-11-10 DIAGNOSIS — I5042 Chronic combined systolic (congestive) and diastolic (congestive) heart failure: Secondary | ICD-10-CM | POA: Diagnosis not present

## 2022-11-10 NOTE — Progress Notes (Signed)
Daily Session Note  Patient Details  Name: Darryl Frye MRN: [id number] Date of Birth: June 21, 1961 Referring Provider:   [name] from 08/28/2022 in [place]  Referring Provider Dr. Tenny Craw       Encounter Date: 11/10/2022  Check In:  Session Check In - 11/10/22 1441       Check-In   Supervising physician immediately available to respond to emergencies [facility] MD immediately available    Physician(s) Dr. Harl Bowie    Location [facility]    Staff Present Aundra Dubin, RN, Joanette Gula, RN, BSN;Heather Mel Almond, BS, Exercise Physiologist    Virtual Visit No    Medication changes reported     No    Fall or balance concerns reported    Yes    Comments He has not fallen, but he does use a rollator due to weakness.    Tobacco Cessation No Change    Warm-up and Cool-down Performed as group-led instruction    Resistance Training Performed Yes    VAD Patient? No    PAD/SET Patient? No      VAD patient   Has back up controller? Yes    Has spare charged batteries? Yes    Has battery cables? Yes    Has compatible battery clips? Yes      Pain Assessment   Currently in Pain? No/denies    Pain Score 0-No pain    Multiple Pain Sites No             Capillary Blood Glucose: No results found for this or any previous visit (from the past 24 hour(s)).    Social History   Tobacco Use  Smoking Status Former   Packs/day: 1.00   Years: 20.00   Additional pack years: 0.00   Total pack years: 20.00   Types: Cigarettes   Quit date: 07/07/2019   Years since quitting: 3.3  Smokeless Tobacco Never    Goals Met:  Independence with exercise equipment Exercise tolerated well No report of concerns or symptoms today Strength training completed today  Goals Unmet:  Not Applicable  Comments: Check out 1645.   Dr. Carlyle Dolly is Medical Director for [facility]

## 2022-11-13 ENCOUNTER — Encounter (HOSPITAL_COMMUNITY): Payer: 59

## 2022-11-15 ENCOUNTER — Encounter (HOSPITAL_COMMUNITY): Payer: 59

## 2022-11-15 ENCOUNTER — Other Ambulatory Visit (HOSPITAL_COMMUNITY): Payer: Self-pay | Admitting: *Deleted

## 2022-11-15 DIAGNOSIS — Z7901 Long term (current) use of anticoagulants: Secondary | ICD-10-CM

## 2022-11-15 DIAGNOSIS — I509 Heart failure, unspecified: Secondary | ICD-10-CM

## 2022-11-15 DIAGNOSIS — Z95811 Presence of heart assist device: Secondary | ICD-10-CM

## 2022-11-15 MED ORDER — WARFARIN SODIUM 3 MG PO TABS: ORAL_TABLET | ORAL | 3 refills | Status: DC

## 2022-11-15 NOTE — Progress Notes (Signed)
Cardiac Individual Treatment Plan  Patient Details  Name: Darryl Frye MRN: [id number] Date of Birth: 1960/12/03 Referring Provider:   [name] from 08/28/2022 in [facility]  Referring Provider Dr. Tenny Craw       Initial Encounter Date:  [name] from 08/28/2022 in [facility]  Date 08/28/22       Visit Diagnosis: Chronic combined systolic and diastolic heart failure ([place])  LVAD (left ventricular assist device) present ([place])  Patient's Home Medications on Admission:  Current Outpatient Medications:    acetaminophen (TYLENOL) 325 MG tablet, Take 1-2 tablets (325-650 mg total) by mouth every 4 (four) hours as needed for mild pain. (Patient not taking: Reported on 10/10/2022), Disp: , Rfl:    atorvastatin (LIPITOR) 20 MG tablet, Take 1 tablet (20 mg total) by mouth daily., Disp: 90 tablet, Rfl: 3   Calcium Carb-Cholecalciferol 600-10 MG-MCG TABS, Take 1 tablet by mouth daily with breakfast., Disp: 90 tablet, Rfl: 3   Continuous Blood Gluc Receiver (FREESTYLE LIBRE 3 READER) DEVI, 1 each by Does not apply route daily., Disp: 2 each, Rfl: 3   Continuous Blood Gluc Sensor (FREESTYLE LIBRE 3 SENSOR) MISC, 1 each by Does not apply route daily. Place 1 sensor on the skin every 14 days. Use to check glucose continuously, Disp: 2 each, Rfl: 3   digoxin (LANOXIN) 0.125 MG tablet, Take 0.5 tablets (0.0625 mg total) by mouth daily., Disp: 30 tablet, Rfl: 5   empagliflozin (JARDIANCE) 10 MG TABS tablet, Take 1 tablet (10 mg total) by mouth daily., Disp: 30 tablet, Rfl: 6   enoxaparin (LOVENOX) 40 MG/0.4ML injection, Inject 0.4 mLs (40 mg total) into the skin every 12 (twelve) hours. (Patient not taking: Reported on 10/10/2022), Disp: 4 mL, Rfl: 0   Fe Fum-Vit C-Vit B12-FA (TRIGELS-F FORTE) CAPS capsule, Take 1 capsule by mouth daily after breakfast. (Patient not taking: Reported on  10/10/2022), Disp: 30 capsule, Rfl: 6   insulin glargine (SEMGLEE, YFGN,) 100 UNIT/ML Solostar Pen, Inject 22 Units into the skin at bedtime., Disp: 15 mL, Rfl: 0   insulin glargine-yfgn (SEMGLEE) 100 UNIT/ML injection, Inject 0.13 mLs (13 Units total) into the skin daily., Disp: 10 mL, Rfl: 11   insulin lispro (HUMALOG KWIKPEN) 100 UNIT/ML KwikPen, Use 4 units three times a day with meals, Disp: 15 mL, Rfl: 0   Insulin Pen Needle (PEN NEEDLES) 32G X 4 MM MISC, Use with insulin pen with breakfast, with lunch, and with evening meal., Disp: 100 each, Rfl: 0   levothyroxine (SYNTHROID) 25 MCG tablet, Take 1 tablet (25 mcg total) by mouth daily before breakfast., Disp: 90 tablet, Rfl: 3   magnesium oxide (MAG-OX) 400 (240 Mg) MG tablet, Take 1 tablet (400 mg total) by mouth 2 (two) times daily., Disp: 180 tablet, Rfl: 3   Multiple Vitamin (MULTIVITAMIN WITH MINERALS) TABS tablet, Take 1 tablet by mouth daily., Disp: , Rfl:    pantoprazole (PROTONIX) 40 MG tablet, Take 1 tablet (40 mg total) by mouth daily., Disp: 90 tablet, Rfl: 3   potassium chloride SA (KLOR-CON M) 20 MEQ tablet, Take 2 tablets (40 mEq total) by mouth daily., Disp: 180 tablet, Rfl: 3   sertraline (ZOLOFT) 50 MG tablet, Take 1 tablet (50 mg total) by mouth daily., Disp: 90 tablet, Rfl: 3   sildenafil (REVATIO) 20 MG tablet, Take 1 tablet (20 mg total) by mouth 3 (three) times daily., Disp: 90 tablet, Rfl:  6   spironolactone (ALDACTONE) 25 MG tablet, Take 1 tablet (25 mg total) by mouth daily., Disp: 90 tablet, Rfl: 3   tiotropium (SPIRIVA HANDIHALER) 18 MCG inhalation capsule, Place 1 capsule (18 mcg total) into inhaler and inhale daily., Disp: 30 capsule, Rfl: 12   torsemide (DEMADEX) 20 MG tablet, Take 4 tablets (80 mg total) by mouth daily. Take 80 mg (4 pills) alternating with 60 mg (3 pills), Disp: 180 tablet, Rfl: 3   warfarin (COUMADIN) 3 MG tablet, Take 6 mg (2 tablets) every Tuesday/Thursday/Saturday and 3 mg (1 tablet) all other  days or as directed by heart failure clinic, Disp: 120 tablet, Rfl: 3  Past Medical History: Past Medical History:  Diagnosis Date   Acute kidney injury ([place])    Acute respiratory failure ([place])    Atrial flutter ([place])    on Eliquis   CHF (congestive heart failure) ([place])    EF 20-25% 10/15/21, AICD in place.   Coronary artery disease    Diabetes mellitus without complication ([place])    DVT (deep venous thrombosis) ([place]) 06/2019   Heart murmur    Hypertension    Presence of permanent cardiac pacemaker    Renal disorder     Tobacco Use: Social History   Tobacco Use  Smoking Status Former   Packs/day: 1.00   Years: 20.00   Additional pack years: 0.00   Total pack years: 20.00   Types: Cigarettes   Quit date: 07/07/2019   Years since quitting: 3.3  Smokeless Tobacco Never    Labs: Review Flowsheet  More data exists      Latest Ref Rng & Units 07/17/2022 07/18/2022 07/19/2022 07/20/2022 07/21/2022  Labs for ITP Cardiac and Pulmonary Rehab  O2 Saturation % 66.5  52  51.2  90.2  64.4  69.2  51.5  43.8  50.4     Capillary Blood Glucose: Lab Results  Component Value Date   GLUCAP 313 (H) 10/27/2022   GLUCAP 140 (H) 09/08/2022   GLUCAP 215 (H) 09/06/2022   GLUCAP 171 (H) 09/04/2022   GLUCAP 128 (H) 08/28/2022    POCT Glucose     Row Name 08/28/22 1419             POCT Blood Glucose   Pre-Exercise 126 mg/dL                Exercise Target Goals: Exercise Program Goal: Individual exercise prescription set using results from initial 6 min walk test and THRR while considering  patient's activity barriers and safety.   Exercise Prescription Goal: Starting with aerobic activity 30 plus minutes a day, 3 days per week for initial exercise prescription. Provide home exercise prescription and guidelines that participant acknowledges understanding prior to discharge.  Activity Barriers & Risk Stratification:  Activity Barriers & Cardiac Risk Stratification -  08/28/22 1335       Activity Barriers & Cardiac Risk Stratification   Activity Barriers Deconditioning;Muscular Weakness;Shortness of Breath;Decreased Ventricular Function;Assistive Device;Other (comment)    Comments neuropathy both feet    Cardiac Risk Stratification High             6 Minute Walk:  6 Minute Walk     Row Name 08/28/22 1427         6 Minute Walk   Phase Initial     Distance 800 feet     Walk Time 6 minutes     # of Rest Breaks 0     MPH 1.51  METS 2.41     RPE 11     VO2 Peak 8.44     Symptoms No     Resting HR 82 bpm     Resting BP --  76 Doppler     Resting Oxygen Saturation  97 %     Exercise Oxygen Saturation  during 6 min walk 96 %     Max Ex. HR 98 bpm     Max Ex. BP --  86 Doppler     2 Minute Post BP --  80 Doppler              Oxygen Initial Assessment:   Oxygen Re-Evaluation:   Oxygen Discharge (Final Oxygen Re-Evaluation):   Initial Exercise Prescription:  Initial Exercise Prescription - 08/28/22 1500       Date of Initial Exercise RX and Referring Provider   Date 08/28/22    Referring Provider Dr. Tenny Craw    Expected Discharge Date 11/24/22      NuStep   Level 1    SPM 60    Minutes 39      Prescription Details   Frequency (times per week) 3    Duration Progress to 30 minutes of continuous aerobic without signs/symptoms of physical distress      Intensity   THRR 40-80% of Max Heartrate 64-129    Ratings of Perceived Exertion 11-13      Resistance Training   Training Prescription Yes    Weight 3    Reps 10-15             Perform Capillary Blood Glucose checks as needed.  Exercise Prescription Changes:   Exercise Prescription Changes     Row Name 09/04/22 1500 09/18/22 1500 10/02/22 1500 10/16/22 1500 10/30/22 1500     Response to Exercise   Blood Pressure (Admit) --  80 Doppler --  84 Doppler --  MAP 76 --  88 MAP --  86 MAP   Blood Pressure (Exercise) --  78 Doppler --  82 Dobbler --  MAP 92  --  86 MAP --  100 MAP   Blood Pressure (Exit) --  74 Doppler --  76 Doppler --  MAP 88 --  82 MAP --  80 MAP   Heart Rate (Admit) 108 bpm 81 bpm 96 bpm 92 bpm 91 bpm   Heart Rate (Exercise) 124 bpm 123 bpm 110 bpm 125 bpm 121 bpm   Heart Rate (Exit) 111 bpm 90 bpm 95 bpm 101 bpm 100 bpm   Rating of Perceived Exertion (Exercise) 12 13 12 13 12    Duration Continue with 30 min of aerobic exercise without signs/symptoms of physical distress. Continue with 30 min of aerobic exercise without signs/symptoms of physical distress. Continue with 30 min of aerobic exercise without signs/symptoms of physical distress. Continue with 30 min of aerobic exercise without signs/symptoms of physical distress. Continue with 30 min of aerobic exercise without signs/symptoms of physical distress.   Intensity THRR unchanged THRR unchanged THRR unchanged THRR unchanged THRR unchanged     Progression   Progression Continue to progress workloads to maintain intensity without signs/symptoms of physical distress. Continue to progress workloads to maintain intensity without signs/symptoms of physical distress. Continue to progress workloads to maintain intensity without signs/symptoms of physical distress. Continue to progress workloads to maintain intensity without signs/symptoms of physical distress. Continue to progress workloads to maintain intensity without signs/symptoms of physical distress.     Resistance Training   Training Prescription Yes Yes  Yes Yes Yes   Weight 2 2 2 3 3    Reps 10-15 10-15 10-15 10-15 10-15   Time 10 Minutes 10 Minutes 10 Minutes 10 Minutes 10 Minutes     Treadmill   MPH -- -- -- 1.5 1   Grade -- -- -- 0 0   Minutes -- -- -- 17 17   METs -- -- -- 2.15 1.77     NuStep   Level 1 3 3 3 3    SPM 85 100 108 105 104   Minutes 39 39 39 22 22   METs 2.07 2.47 2.77 2.66 2.58    Row Name 11/10/22 1501             Response to Exercise   Blood Pressure (Admit) --  92 MAP       Blood  Pressure (Exercise) --  90 MAP       Blood Pressure (Exit) --  80 MAP       Heart Rate (Admit) 80 bpm       Heart Rate (Exercise) 119 bpm       Heart Rate (Exit) 90 bpm       Rating of Perceived Exertion (Exercise) 12       Duration Continue with 30 min of aerobic exercise without signs/symptoms of physical distress.       Intensity THRR unchanged         Progression   Progression Continue to progress workloads to maintain intensity without signs/symptoms of physical distress.         Resistance Training   Training Prescription Yes       Weight 5       Reps 10-15       Time 10 Minutes         Treadmill   MPH 1.5       Grade 0       Minutes 17       METs 2.15         NuStep   Level 4       SPM 96       Minutes 22       METs 2.76                Exercise Comments:   Exercise Goals and Review:   Exercise Goals     Row Name 08/28/22 1532 09/18/22 1500 10/16/22 1500 11/13/22 1504       Exercise Goals   Increase Physical Activity Yes Yes Yes Yes    Intervention Provide advice, education, support and counseling about physical activity/exercise needs.;Develop an individualized exercise prescription for aerobic and resistive training based on initial evaluation findings, risk stratification, comorbidities and participant's personal goals. Provide advice, education, support and counseling about physical activity/exercise needs.;Develop an individualized exercise prescription for aerobic and resistive training based on initial evaluation findings, risk stratification, comorbidities and participant's personal goals. Provide advice, education, support and counseling about physical activity/exercise needs.;Develop an individualized exercise prescription for aerobic and resistive training based on initial evaluation findings, risk stratification, comorbidities and participant's personal goals. Provide advice, education, support and counseling about physical activity/exercise  needs.;Develop an individualized exercise prescription for aerobic and resistive training based on initial evaluation findings, risk stratification, comorbidities and participant's personal goals.    Expected Outcomes Short Term: Attend rehab on a regular basis to increase amount of physical activity.;[place] Term: Add in home exercise to make exercise part of routine and to increase amount of physical activity.;[place] Term:  Exercising regularly at least 3-5 days a week. Short Term: Attend rehab on a regular basis to increase amount of physical activity.;[place] Term: Add in home exercise to make exercise part of routine and to increase amount of physical activity.;[place] Term: Exercising regularly at least 3-5 days a week. Short Term: Attend rehab on a regular basis to increase amount of physical activity.;Alcant[place] Term: Add in home exercise to make exercise part of routine and to increase amount of physical activity.;Grzy[place]acz Term: Exercising regularly at least 3-5 days a week. Short Term: Attend rehab on a regular basis to increase amount of physical activity.;[place] Term: Add in home exercise to make exercise part of routine and to increase amount of physical activity.;[place] Term: Exercising regularly at least 3-5 days a week.    Increase Strength and Stamina Yes Yes Yes Yes    Intervention Provide advice, education, support and counseling about physical activity/exercise needs.;Develop an individualized exercise prescription for aerobic and resistive training based on initial evaluation findings, risk stratification, comorbidities and participant's personal goals. Provide advice, education, support and counseling about physical activity/exercise needs.;Develop an individualized exercise prescription for aerobic and resistive training based on initial evaluation findings, risk stratification, comorbidities and participant's personal goals. Provide advice, education, support and counseling about physical activity/exercise  needs.;Develop an individualized exercise prescription for aerobic and resistive training based on initial evaluation findings, risk stratification, comorbidities and participant's personal goals. Provide advice, education, support and counseling about physical activity/exercise needs.;Develop an individualized exercise prescription for aerobic and resistive training based on initial evaluation findings, risk stratification, comorbidities and participant's personal goals.    Expected Outcomes Short Term: Increase workloads from initial exercise prescription for resistance, speed, and METs.;Short Term: Perform resistance training exercises routinely during rehab and add in resistance training at home;[place] Term: Improve cardiorespiratory fitness, muscular endurance and strength as measured by increased METs and functional capacity (6MWT) Short Term: Increase workloads from initial exercise prescription for resistance, speed, and METs.;Short Term: Perform resistance training exercises routinely during rehab and add in resistance training at home;[place] Term: Improve cardiorespiratory fitness, muscular endurance and strength as measured by increased METs and functional capacity (6MWT) Short Term: Increase workloads from initial exercise prescription for resistance, speed, and METs.;Short Term: Perform resistance training exercises routinely during rehab and add in resistance training at home;[place] Term: Improve cardiorespiratory fitness, muscular endurance and strength as measured by increased METs and functional capacity (6MWT) Short Term: Increase workloads from initial exercise prescription for resistance, speed, and METs.;Short Term: Perform resistance training exercises routinely during rehab and add in resistance training at home;[place]tt Term: Improve cardiorespiratory fitness, muscular endurance and strength as measured by increased METs and functional capacity (6MWT)    Able to understand and use rate of perceived  exertion (RPE) scale Yes Yes Yes Yes    Intervention Provide education and explanation on how to use RPE scale Provide education and explanation on how to use RPE scale Provide education and explanation on how to use RPE scale Provide education and explanation on how to use RPE scale    Expected Outcomes Short Term: Able to use RPE daily in rehab to express subjective intensity level;Debord Term:  Able to use RPE to guide intensity level when exercising independently Short Term: Able to use RPE daily in rehab to express subjective intensity level;[place]ates Term:  Able to use RPE to guide intensity level when exercising independently Short Term: Able to use RPE daily in rehab to express subjective intensity level;[place] Term:  Able to use RPE to guide  intensity level when exercising independently Short Term: Able to use RPE daily in rehab to express subjective intensity level;[place] Term:  Able to use RPE to guide intensity level when exercising independently    Knowledge and understanding of Target Heart Rate Range (THRR) Yes Yes Yes Yes    Intervention Provide education and explanation of THRR including how the numbers were predicted and where they are located for reference Provide education and explanation of THRR including how the numbers were predicted and where they are located for reference Provide education and explanation of THRR including how the numbers were predicted and where they are located for reference Provide education and explanation of THRR including how the numbers were predicted and where they are located for reference    Expected Outcomes Short Term: Able to state/look up THRR;Klingel Term: Able to use THRR to govern intensity when exercising independently;Short Term: Able to use daily as guideline for intensity in rehab Short Term: Able to state/look up THRR;Zwiebel Term: Able to use THRR to govern intensity when exercising independently;Short Term: Able to use daily as guideline for intensity in rehab  Short Term: Able to state/look up THRR;[place]oto Term: Able to use THRR to govern intensity when exercising independently;Short Term: Able to use daily as guideline for intensity in rehab Short Term: Able to state/look up THRR;[place]udson Term: Able to use THRR to govern intensity when exercising independently;Short Term: Able to use daily as guideline for intensity in rehab    Able to check pulse independently Yes Yes Yes Yes    Intervention Provide education and demonstration on how to check pulse in carotid and radial arteries.;Review the importance of being able to check your own pulse for safety during independent exercise Provide education and demonstration on how to check pulse in carotid and radial arteries.;Review the importance of being able to check your own pulse for safety during independent exercise Provide education and demonstration on how to check pulse in carotid and radial arteries.;Review the importance of being able to check your own pulse for safety during independent exercise Provide education and demonstration on how to check pulse in carotid and radial arteries.;Review the importance of being able to check your own pulse for safety during independent exercise    Expected Outcomes Short Term: Able to explain why pulse checking is important during independent exercise;[place] Term: Able to check pulse independently and accurately Short Term: Able to explain why pulse checking is important during independent exercise;[place] Term: Able to check pulse independently and accurately Short Term: Able to explain why pulse checking is important during independent exercise;[place] Term: Able to check pulse independently and accurately Short Term: Able to explain why pulse checking is important during independent exercise;[place] Term: Able to check pulse independently and accurately    Understanding of Exercise Prescription Yes Yes Yes Yes    Intervention Provide education, explanation, and written materials on patient's  individual exercise prescription Provide education, explanation, and written materials on patient's individual exercise prescription Provide education, explanation, and written materials on patient's individual exercise prescription Provide education, explanation, and written materials on patient's individual exercise prescription    Expected Outcomes Short Term: Able to explain program exercise prescription;[place] Term: Able to explain home exercise prescription to exercise independently Short Term: Able to explain program exercise prescription;[place] Term: Able to explain home exercise prescription to exercise independently Short Term: Able to explain program exercise prescription;[place] Term: Able to explain home exercise prescription to exercise independently Short Term: Able to explain program exercise prescription;Diniz Term: Able  to explain home exercise prescription to exercise independently             Exercise Goals Re-Evaluation :  Exercise Goals Re-Evaluation     Row Name 09/18/22 1500 10/16/22 1500 11/13/22 1504         Exercise Goal Re-Evaluation   Exercise Goals Review Increase Physical Activity;Able to understand and use rate of perceived exertion (RPE) scale;Increase Strength and Stamina;Knowledge and understanding of Target Heart Rate Range (THRR);Able to check pulse independently;Understanding of Exercise Prescription Increase Physical Activity;Increase Strength and Stamina;Able to understand and use rate of perceived exertion (RPE) scale;Knowledge and understanding of Target Heart Rate Range (THRR);Able to check pulse independently;Understanding of Exercise Prescription Increase Physical Activity;Increase Strength and Stamina;Able to understand and use rate of perceived exertion (RPE) scale;Knowledge and understanding of Target Heart Rate Range (THRR);Able to check pulse independently;Understanding of Exercise Prescription     Comments Pt has completed 7 sessions of CR. He enjoys  coming to class and talking to other patients. He is progressing and increasing his workloads each class. He is getting stronger and we plan to have him walk on the treadmill in the future sessions. He is currently exercising at 2.47 METs on the stepper. Will continue to monitor and progress as able. Pt has completed 13 sessions of cardiac rehab. He enjoys coming to class and is progessing. He is able to walk on the treadmill now and is walking in with a cane instead of his rollator. He is increasing his workload on the stepper as well. He is currently exercising at 2.66 METs on the stepepr. Will continue to monitor and progress as able. Pt has completed 21 sessions of cardiac rehab. He continues to enjoy coming to class and is progressing his workloads. He is steady when walking on teh treadmill and increasing his workload on the stepper. He is currently exercising at 2.76 METs on the stepper. Will continue to monitor and progress as able.     Expected Outcomes Through exercise at rehab and home, the patient will meet thier stated goals Through exercise at rehab and home, the patient will meet thier stated goals Through exercise at rehab and home, the patient will meet thier stated goals               Discharge Exercise Prescription (Final Exercise Prescription Changes):  Exercise Prescription Changes - 11/10/22 1501       Response to Exercise   Blood Pressure (Admit) --   92 MAP   Blood Pressure (Exercise) --   90 MAP   Blood Pressure (Exit) --   80 MAP   Heart Rate (Admit) 80 bpm    Heart Rate (Exercise) 119 bpm    Heart Rate (Exit) 90 bpm    Rating of Perceived Exertion (Exercise) 12    Duration Continue with 30 min of aerobic exercise without signs/symptoms of physical distress.    Intensity THRR unchanged      Progression   Progression Continue to progress workloads to maintain intensity without signs/symptoms of physical distress.      Resistance Training   Training Prescription  Yes    Weight 5    Reps 10-15    Time 10 Minutes      Treadmill   MPH 1.5    Grade 0    Minutes 17    METs 2.15      NuStep   Level 4    SPM 96    Minutes 22    METs 2.76  Nutrition:  Target Goals: Understanding of nutrition guidelines, daily intake of sodium 1500mg , cholesterol 200mg , calories 30% from fat and 7% or less from saturated fats, daily to have 5 or more servings of fruits and vegetables.  Biometrics:  Pre Biometrics - 08/28/22 1533       Pre Biometrics   Height 5\' 11"  (1.803 m)    Weight 86.2 kg    Waist Circumference 38 inches    Hip Circumference 40 inches    Waist to Hip Ratio 0.95 %    BMI (Calculated) 26.52    Triceps Skinfold 5 mm    % Body Fat 21.2 %    Grip Strength 46.6 kg    Flexibility 0 in    Single Leg Stand 0 seconds              Nutrition Therapy Plan and Nutrition Goals:  Nutrition Therapy & Goals - 09/11/22 1030       Personal Nutrition Goals   Comments Patient's diet assessment score was 63. We offer 2 educational sessions on heart healthy nutrition with handouts and assistance with RD referral if patient is interested.      Intervention Plan   Intervention Nutrition handout(s) given to patient.    Expected Outcomes Short Term Goal: Understand basic principles of dietary content, such as calories, fat, sodium, cholesterol and nutrients.             Nutrition Assessments:  Nutrition Assessments - 08/28/22 1341       MEDFICTS Scores   Pre Score 63            MEDIFICTS Score Key: ?70 Need to make dietary changes  40-70 Heart Healthy Diet ? 40 Therapeutic Level Cholesterol Diet   Picture Your Plate Scores: [id number] Unhealthy dietary pattern with much room for improvement. 41-50 Dietary pattern unlikely to meet recommendations for good health and room for improvement. 51-60 More healthful dietary pattern, with some room for improvement.  >60 Healthy dietary pattern, although there may be some  specific behaviors that could be improved.    Nutrition Goals Re-Evaluation:   Nutrition Goals Discharge (Final Nutrition Goals Re-Evaluation):   Psychosocial: Target Goals: Acknowledge presence or absence of significant depression and/or stress, maximize coping skills, provide positive support system. Participant is able to verbalize types and ability to use techniques and skills needed for reducing stress and depression.  Initial Review & Psychosocial Screening:  Initial Psych Review & Screening - 08/28/22 1337       Initial Review   Current issues with Current Sleep Concerns      Family Dynamics   Good Support System? Yes    Comments His brother, his ex wife, his sister in law, his brother in law, and his friends are his support system.      Barriers   Psychosocial barriers to participate in program There are no identifiable barriers or psychosocial needs.      Screening Interventions   Interventions Encouraged to exercise    Expected Outcomes [place] Term goal: The participant improves quality of Life and PHQ9 Scores as seen by post scores and/or verbalization of changes;Short Term goal: Identification and review with participant of any Quality of Life or Depression concerns found by scoring the questionnaire.             Quality of Life Scores:  Quality of Life - 08/28/22 1534       Quality of Life   Select Quality of Life  Quality of Life Scores   Health/Function Pre 17.15 %    Socioeconomic Pre 26.25 %    Psych/Spiritual Pre 27.5 %    Family Pre 28 %    GLOBAL Pre 22.48 %            Scores of 19 and below usually indicate a poorer quality of life in these areas.  A difference of  2-3 points is a clinically meaningful difference.  A difference of 2-3 points in the total score of the Quality of Life Index has been associated with significant improvement in overall quality of life, self-image, physical symptoms, and general health in studies assessing  change in quality of life.  PHQ-9: Review Flowsheet       08/28/2022  Depression screen PHQ 2/9  Decreased Interest 2  Down, Depressed, Hopeless 0  PHQ - 2 Score 2  Altered sleeping 3  Tired, decreased energy 3  Change in appetite 0  Feeling bad or failure about yourself  0  Trouble concentrating 0  Moving slowly or fidgety/restless 0  Suicidal thoughts 0  PHQ-9 Score 8  Difficult doing work/chores Somewhat difficult   Interpretation of Total Score  Total Score Depression Severity:  1-4 = Minimal depression, 5-9 = Mild depression, 10-14 = Moderate depression, 15-19 = Moderately severe depression, 20-27 = Severe depression   Psychosocial Evaluation and Intervention:  Psychosocial Evaluation - 08/28/22 1415       Psychosocial Evaluation & Interventions   Interventions Stress management education;Relaxation education;Encouraged to exercise with the program and follow exercise prescription    Comments Pt's only potential barrier to participating in CR is transportation. He is not allowed to drive until [id number], and he will be relying on transportation from his insurance company. He said that the transportation has been reliable so far. He does report sleep concerns, but no other identifiable psychosocial issues. He does not currently take anything to aid his sleep. He scored an 8 on his PHQ-9, and this relates to his sleep, his lack of energy, and his inability to do things that he enjoys. 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 He seems to have a positive outlook, and is motivated to improve. He states that he has a good support system with his ex wife, brother, sister, in laws, and friends. His goals while in the program are to decrease his SOB, increase his strength/stamina, and to get his legs stronger. He is eager to start the program and is motivated.    Expected Outcomes Pt will continue to have no identifiable psychosocial issues.    Continue Psychosocial Services   No Follow up required             Psychosocial Re-Evaluation:  Psychosocial Re-Evaluation     [place] Name 08/30/22 [id number] 09/11/22 1228 10/09/22 1248 11/06/22 1233       Psychosocial Re-Evaluation   Current issues with History of Depression None Identified None Identified None Identified    Comments Pt is prescribed Zoloft. This was prescribed during his LVAD work up. Pt denied anxiety or depression during orienation, and he states that he is adjusting well to his LVAD. Patient is new to the program. He has completed 4 sessions and is doing well. He continues to have no psychosocial barriers identified. His continues Sertraline to manage depression and anxiety. He seems to enjoy the sessions and demonstrates an interest in improving his health. We will continue to monitor his progress. Patient has completed 12 sessions and  is doing well. He continues to have no psychosocial barriers identified. His continues Sertraline to manage depression and anxiety. He continues to enjoy the sessions and demonstrates an interest in improving his health. We will continue to monitor his progress. Patient has completed 18 sessions and is doing well. He continues to have no psychosocial barriers identified. His depression and anxiety continue to be managed with  Sertraline. He continues to enjoy the sessions and demonstrates an interest in improving his health. We will continue to monitor his progress.    Expected Outcomes -- He will continue to have no psychosocial barriers identified. He will continue to have no psychosocial barriers identified. He will continue to have no psychosocial barriers identified.    Interventions -- Stress management education;Encouraged to attend Cardiac Rehabilitation for the exercise;Relaxation education Stress management education;Encouraged to attend Cardiac Rehabilitation for the exercise;Relaxation education Stress management education;Encouraged to attend Cardiac Rehabilitation for  the exercise;Relaxation education    Continue Psychosocial Services  -- No Follow up required No Follow up required No Follow up required             Psychosocial Discharge (Final Psychosocial Re-Evaluation):  Psychosocial Re-Evaluation - 11/06/22 1233       Psychosocial Re-Evaluation   Current issues with None Identified    Comments Patient has completed 18 sessions and is doing well. He continues to have no psychosocial barriers identified. His depression and anxiety continue to be managed with  Sertraline. He continues to enjoy the sessions and demonstrates an interest in improving his health. We will continue to monitor his progress.    Expected Outcomes He will continue to have no psychosocial barriers identified.    Interventions Stress management education;Encouraged to attend Cardiac Rehabilitation for the exercise;Relaxation education    Continue Psychosocial Services  No Follow up required             Vocational Rehabilitation: Provide vocational rehab assistance to qualifying candidates.   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.             Education: Education Goals: Education classes will be provided on a weekly basis, covering required topics. Participant will state understanding/return demonstration of topics presented.  Learning Barriers/Preferences:  Learning Barriers/Preferences - 08/28/22 1341       Learning Barriers/Preferences   Learning Barriers None    Learning Preferences Verbal Instruction             Education Topics: Hypertension, Hypertension Reduction -Define heart disease and high blood pressure. Discus how  high blood pressure affects the body and ways to reduce high blood pressure.   Exercise and Your Heart -Discuss why it is important to exercise, the FITT principles of exercise, normal and abnormal responses to exercise, and how to exercise safely.   Angina -Discuss definition of angina, causes of angina, treatment of angina, and how to decrease risk of having angina. Flowsheet Row CARDIAC REHAB PHASE II EXERCISE from 11/08/2022 in [place]  Date 09/06/22  Educator DF  Instruction Review Code 2- Demonstrated Understanding       Cardiac Medications -Review what the following cardiac medications are used for, how  they affect the body, and side effects that may occur when taking the medications.  Medications include Aspirin, Beta blockers, calcium channel blockers, ACE Inhibitors, angiotensin receptor blockers, diuretics, digoxin, and antihyperlipidemics. Flowsheet Row CARDIAC REHAB PHASE II EXERCISE from 11/08/2022 in [place]  Date 09/20/22  Instruction Review Code 1- Verbalizes Understanding       Congestive Heart Failure -Discuss the definition of CHF, how to live with CHF, the signs and symptoms of CHF, and how keep track of weight and sodium intake. Flowsheet Row CARDIAC REHAB PHASE II EXERCISE from 11/08/2022 in [place]  Date 09/13/22  Educator HB  Instruction Review Code 1- Verbalizes Understanding       Heart Disease and Intimacy -Discus the effect sexual activity has on the heart, how changes occur during intimacy as we age, and safety during sexual activity. Flowsheet Row CARDIAC REHAB PHASE II EXERCISE from 11/08/2022 in [place]  Date 10/04/22  Educator DF  Instruction Review Code 1- Verbalizes Understanding       Smoking Cessation / COPD -Discuss different methods to quit smoking, the health benefits of quitting smoking, and the definition of COPD.   Nutrition  I: Fats -Discuss the types of cholesterol, what cholesterol does to the heart, and how cholesterol levels can be controlled.   Nutrition II: Labels -Discuss the different components of food labels and how to read food label [place] from 11/08/2022 in [place]  Date 10/25/22  Educator HB  Instruction Review Code 2- Demonstrated Understanding       Heart Parts/Heart Disease and PAD -Discuss the anatomy of the heart, the pathway of blood circulation through the heart, and these are affected by heart disease. Flowsheet Row CARDIAC REHAB PHASE II EXERCISE from 11/08/2022 in [place]  Date 11/01/22  Educator HB  Instruction Review Code 1- Verbalizes Understanding       Stress I: Signs and Symptoms -Discuss the causes of stress, how stress may lead to anxiety and depression, and ways to limit stress. Flowsheet Row CARDIAC REHAB PHASE II EXERCISE from 11/08/2022 in [place]  Date 11/08/22  Educator DF  Instruction Review Code 2- Demonstrated Understanding       Stress II: Relaxation -Discuss different types of relaxation techniques to limit stress.   Warning Signs of Stroke / TIA -Discuss definition of a stroke, what the signs and symptoms are of a stroke, and how to identify when someone is having stroke.   Knowledge Questionnaire Score:  Knowledge Questionnaire Score - 08/28/22 1342       Knowledge Questionnaire Score   Pre Score 21/24             Core Components/Risk Factors/Patient Goals at Admission:  Personal Goals and Risk Factors at Admission - 08/28/22 1347       Core Components/Risk Factors/Patient Goals on Admission   Improve shortness of breath with ADL's Yes    Intervention Provide education, individualized exercise plan and daily activity instruction to help decrease symptoms of SOB with activities of daily living.    Expected Outcomes Short  Term: Improve cardiorespiratory fitness to achieve a reduction of symptoms when performing ADLs;[place]cker Term: Be able to perform more ADLs without symptoms or delay the onset of symptoms    Diabetes Yes    Intervention Provide education about signs/symptoms and action to take for hypo/hyperglycemia.;Provide education about proper nutrition, including hydration, and aerobic/resistive exercise prescription  along with prescribed medications to achieve blood glucose in normal ranges: Fasting glucose 65-99 mg/dL    Expected Outcomes Short Term: Participant verbalizes understanding of the signs/symptoms and immediate care of hyper/hypoglycemia, proper foot care and importance of medication, aerobic/resistive exercise and nutrition plan for blood glucose control.;[place] Term: Attainment of HbA1C < 7%.    Heart Failure Yes    Intervention Provide a combined exercise and nutrition program that is supplemented with education, support and counseling about heart failure. Directed toward relieving symptoms such as shortness of breath, decreased exercise tolerance, and extremity edema.    Expected Outcomes Improve functional capacity of life;Short term: Attendance in program 2-3 days a week with increased exercise capacity. Reported lower sodium intake. Reported increased fruit and vegetable intake. Reports medication compliance.;Short term: Daily weights obtained and reported for increase. Utilizing diuretic protocols set by physician.;[place] term: Adoption of self-care skills and reduction of barriers for early signs and symptoms recognition and intervention leading to self-care maintenance.    Lipids Yes    Intervention Provide education and support for participant on nutrition & aerobic/resistive exercise along with prescribed medications to achieve LDL 70mg , HDL >40mg .    Expected Outcomes Short Term: Participant states understanding of desired cholesterol values and is compliant with medications prescribed. Participant  is following exercise prescription and nutrition guidelines.;Yi Term: Cholesterol controlled with medications as prescribed, with individualized exercise RX and with personalized nutrition plan. Value goals: LDL < 70mg , HDL > 40 mg.    Personal Goal Other Yes    Personal Goal Increase strength, stamina, and leg strength.    Intervention Attend CR three days per week and begin a home exercise program.    Expected Outcomes Pt will meet stated goal.             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 He has multiple risk factors for CAD and is participating in the program for risk modification. He has completed 4 sessions. His current weight is 195.0 lbs. He is doing well in the program. His doppler readings are WNL. His DM is manged with Insulin and Jardiance. His last A1C on file was 11/23 at 9.3%. His personal goals for the program are to decrease his SOB; increase his strength and stamina; and get his legs stronger. We will continue to monitor his progress as he works towards meeting these goals. Patient has completed 12 sessions. His current weight is 196.4 lbs up 1.4 lbs since last 30 day review. He continues to do well in the program with consistent attendance and progressions. His doppler readings are WNL. He saw Erin Sons, PA with cardiology for ICD follow up. No changes made. Patient did complain of SOB. It was felt he was euvolemic.  His DM continues to be manged with Insulin and Jardiance. His  last A1C on file was 11/23 at 9.3%. His personal goals for the program are to decrease his SOB; increase his strength and stamina; and get his legs stronger. We will continue to monitor his progress as he works towards meeting these goals.  Patient has completed 18 sessions. His current weight is 203.1 lbs up 6.7 lbs since last 30 day review. He continues to do well in the program with consistent attendance and progressions. His doppler readings are WNL. Patient was sent to the ED 3/8 by our staff after reporting a near syncope episode getting out of his car in the parking lot with urinary incontience. He was treated for hypokalemia in the ED and discharged home. His DM continues to be manged with Insulin and Jardiance. His last A1C on file was 11/23 at 9.3%. His personal goals for the program are to decrease his SOB; increase his strength and stamina; and get his legs stronger. We will continue to monitor his progress as he works towards meeting these goals.     Expected Outcomes Patient will complete the program meeting both personal and program goals. Patient will complete the program meeting both personal and program goals. Patient will complete the program meeting both personal and program goals.              Core Components/Risk Factors/Patient Goals at Discharge (Final Review):   Goals and Risk Factor Review - 11/06/22 1234       Core Components/Risk Factors/Patient Goals Review   Personal Goals Review Lipids;Diabetes;Improve shortness of breath with ADL's;Heart Failure    Review Patient has completed 18 sessions. His current weight is 203.1 lbs up 6.7 lbs since last 30 day review. He continues to do well in the program with consistent attendance and progressions. His doppler readings are WNL. Patient was sent to the ED 3/8 by our staff after reporting a near syncope episode getting out of his car in the parking lot with urinary incontience. He was treated for hypokalemia in the ED and  discharged home. His DM continues to be manged with Insulin and Jardiance. His last A1C on file was 11/23 at 9.3%. His personal goals for the program are to decrease his SOB; increase his strength and stamina; and get his legs stronger. We will continue to monitor his progress as he works towards meeting these goals.    Expected Outcomes Patient will complete the program meeting both personal and program goals.             ITP Comments:   Comments: ITP REVIEW Pt is making expected progress toward Cardiac Rehab goals after completing 22 sessions. Recommend continued exercise, life style modification, education, and increased stamina and strength.

## 2022-11-16 ENCOUNTER — Ambulatory Visit (HOSPITAL_COMMUNITY)
Admission: RE | Admit: 2022-11-16 | Discharge: 2022-11-16 | Disposition: A | Discharge status: Home / Self Care | Payer: 59 | Source: Ambulatory Visit | Attending: Internal Medicine | Admitting: Internal Medicine

## 2022-11-16 ENCOUNTER — Ambulatory Visit (HOSPITAL_COMMUNITY): Payer: Self-pay | Admitting: Pharmacist

## 2022-11-16 ENCOUNTER — Encounter (HOSPITAL_COMMUNITY): Payer: Self-pay | Admitting: Internal Medicine

## 2022-11-16 VITALS — BP 133/66 | HR 79 | Wt 208.6 lb

## 2022-11-16 DIAGNOSIS — I5043 Acute on chronic combined systolic (congestive) and diastolic (congestive) heart failure: Secondary | ICD-10-CM

## 2022-11-16 DIAGNOSIS — I272 Pulmonary hypertension, unspecified: Secondary | ICD-10-CM | POA: Insufficient documentation

## 2022-11-16 DIAGNOSIS — Z7901 Long term (current) use of anticoagulants: Secondary | ICD-10-CM | POA: Insufficient documentation

## 2022-11-16 DIAGNOSIS — I509 Heart failure, unspecified: Secondary | ICD-10-CM

## 2022-11-16 DIAGNOSIS — Z86718 Personal history of other venous thrombosis and embolism: Secondary | ICD-10-CM | POA: Diagnosis not present

## 2022-11-16 DIAGNOSIS — I4891 Unspecified atrial fibrillation: Secondary | ICD-10-CM | POA: Insufficient documentation

## 2022-11-16 DIAGNOSIS — I4892 Unspecified atrial flutter: Secondary | ICD-10-CM | POA: Diagnosis not present

## 2022-11-16 DIAGNOSIS — Z79890 Hormone replacement therapy (postmenopausal): Secondary | ICD-10-CM | POA: Diagnosis not present

## 2022-11-16 DIAGNOSIS — I428 Other cardiomyopathies: Secondary | ICD-10-CM | POA: Diagnosis not present

## 2022-11-16 DIAGNOSIS — E1122 Type 2 diabetes mellitus with diabetic chronic kidney disease: Secondary | ICD-10-CM | POA: Diagnosis not present

## 2022-11-16 DIAGNOSIS — Z7984 Long term (current) use of oral hypoglycemic drugs: Secondary | ICD-10-CM | POA: Insufficient documentation

## 2022-11-16 DIAGNOSIS — E039 Hypothyroidism, unspecified: Secondary | ICD-10-CM | POA: Insufficient documentation

## 2022-11-16 DIAGNOSIS — D649 Anemia, unspecified: Secondary | ICD-10-CM | POA: Insufficient documentation

## 2022-11-16 DIAGNOSIS — J90 Pleural effusion, not elsewhere classified: Secondary | ICD-10-CM | POA: Diagnosis not present

## 2022-11-16 DIAGNOSIS — N183 Chronic kidney disease, stage 3 unspecified: Secondary | ICD-10-CM | POA: Insufficient documentation

## 2022-11-16 DIAGNOSIS — Z79899 Other long term (current) drug therapy: Secondary | ICD-10-CM | POA: Insufficient documentation

## 2022-11-16 DIAGNOSIS — Z794 Long term (current) use of insulin: Secondary | ICD-10-CM | POA: Diagnosis not present

## 2022-11-16 DIAGNOSIS — I5023 Acute on chronic systolic (congestive) heart failure: Secondary | ICD-10-CM | POA: Diagnosis not present

## 2022-11-16 DIAGNOSIS — Z95811 Presence of heart assist device: Secondary | ICD-10-CM

## 2022-11-16 DIAGNOSIS — Z87891 Personal history of nicotine dependence: Secondary | ICD-10-CM | POA: Diagnosis not present

## 2022-11-16 LAB — BASIC METABOLIC PANEL
Anion gap: 7 (ref 5–15)
BUN: 21 mg/dL — ABNORMAL HIGH (ref 6–20)
CO2: 23 mmol/L (ref 22–32)
Calcium: 9.1 mg/dL (ref 8.9–10.3)
Chloride: 104 mmol/L (ref 98–111)
Creatinine, Ser: 1.16 mg/dL (ref 0.61–1.24)
GFR, Estimated: >60 mL/min (ref >60)
Glucose, Bld: 191 mg/dL — ABNORMAL HIGH (ref 70–99)
Potassium: 5.3 mmol/L — ABNORMAL HIGH (ref 3.5–5.1)
Sodium: 134 mmol/L — ABNORMAL LOW (ref 135–145)

## 2022-11-16 LAB — PROTIME-INR
INR: 1.6 — ABNORMAL HIGH (ref 0.8–1.2)
Prothrombin Time: 19.3 seconds — ABNORMAL HIGH (ref 11.4–15.2)

## 2022-11-16 MED ORDER — FUROSEMIDE 10 MG/ML IJ SOLN
80.0000 mg | Freq: Once | INTRAMUSCULAR | Status: AC
  Administered 2022-11-16: 80 mg via INTRAVENOUS

## 2022-11-16 MED ORDER — METOLAZONE 2.5 MG PO TABS
2.5000 mg | ORAL_TABLET | Freq: Once | ORAL | Status: AC
  Administered 2022-11-16: 2.5 mg via ORAL

## 2022-11-16 MED ORDER — POTASSIUM CHLORIDE CRYS ER 20 MEQ PO TBCR
40.0000 meq | EXTENDED_RELEASE_TABLET | Freq: Once | ORAL | Status: AC
  Administered 2022-11-16: 40 meq via ORAL

## 2022-11-16 NOTE — Progress Notes (Signed)
ReDS Vest / Clip - 11/16/22 0800       ReDS Vest / Clip   Station Marker D    Ruler Value 33    ReDS Value Range High volume overload    ReDS Actual Value 42

## 2022-11-16 NOTE — Progress Notes (Addendum)
Pt presents for dressing change today. Upon arrival patient complaining of constant shortness of breath and dizziness. Able to walk unassisted to scale and get onto clinic room stretcher with notable shortness of breath with activity.   Pt denies falls and any signs of bleeding. Pt is suppose to take 80mg  of Torsemide daily. Pt states he usually takes his diuretic at night because he does not want to rush to the bathroom all day. However, pt states he did not take Toresmide yesterday because it was too late before he remembered and has not taken it today either.   ReDS vest reading obtained with actual of value of 42. Dr. Haroldine Laws saw patient in [facility]. Verbal orders received to give 80mg  of IV Lasix, 2.5mg  of Metolazone and 46meq of Potassium in Clinic along with a STAT chest xray.  20G IV placed in right AC by this RN. Medications given. Please see MAR for medication details. Pt had a good response to diuretics with >1L out within 30 minutes of receiving medication. IV removed from R AC. Catheter intact upon removal.  Pt has follow up in [facility] Monday at 1000 with Dr. Aundra Dubin. Ramp echo scheduled at this visit.  Stressed to pt the importance of proper medication compliance. Instructed pt to take Metolazone in the morning to ensure consistent compliance and avoid missing doses.  Vital Signs:  Automatic BP: 133/66 (103) HR: 79 SPO2: 97 % RA Weight: 208.6 lb w/ eqt Last weight: 197.6 lb w/ ept BMI today 29.1 today  VAD Indication: Destination Therapy due to uncontrolled diabetes   LVAD assessment: HM III: VAD Speed: 5600 rpms Flow: 4.6 Power: 4.2w    PI: 3.4 Hct: 30   Alarms: none Events: 0-5  Fixed speed: 5500 Low speed limit: 5300  Primary controller: back up battery due for replacement in  24 months Secondary controller:  back up battery due for replacement in 24 months  I reviewed the LVAD parameters from today and compared the results to the patient's prior recorded  data. LVAD interrogation was NEGATIVE for significant power changes, NEGATIVE for clinical alarms and STABLE for PI events/speed drops. No programming changes were made and pump is functioning within specified parameters. Pt is performing daily controller and system monitor self tests along with completing weekly and monthly maintenance for LVAD equipment.   LVAD equipment check completed and is in good working order. Back-up equipment present. Charged back up battery and performed self-test on equipment.    Annual Equipment Maintenance on UBC/PM was performed on 06/2022.   Exit Site Care: Existing VAD dressing removed and site care performed using sterile technique by pt with VAD Coordinator observation with minimal cues. Drive line exit site cleaned with Chlora prep applicators x 2, allowed to dry, and gauze dressing. Exit site healed and incorporated, the velour is fully implanted at exit site. Redness, burning noted with cleansing, no tenderness, and scant yellow drainage noted on previous dressing. No foul odor or rash noted. Drive line anchor re-applied. Pt denies fever or chills.  Advised to allow [name] to perform or assist to avoid infection, but pt insists he will perform his own dressing change. Pt instructed to call VAD Coordinator if he notices increases drainage, redness, or tenderness at site. Pt has adequate dressing supplies at home. Advised pt VAD Coordinator will perform the next dressing change at full visit on Monday.      Significant Events on VAD Support:  none   Device: N/A    BP &  Labs:   BUN-21  Cr- 1.16  INR-1.6  Patient Instructions: Continue Torsemide 80mg  daily. Take medication in the morning. Coumadin dosing per Ander Purpura [facility] Return to [facility] Monday for full visit with Dr. Aundra Dubin with ramp echo  Bobbye Morton RN,BSN [place] Coordinator  Office: [phone]  24/7 Pager: [phone]

## 2022-11-16 NOTE — Progress Notes (Signed)
LVAD INR 

## 2022-11-16 NOTE — Progress Notes (Signed)
ADVANCED HF/VAD CLINIC NOTE   [name] is a 62 y.o. with history of atrial fibrillation/flutter, polysubstance abuse and systolic HF due to nonischemic cardiomyopathy s/p HM3 LVAD placement with LAA clipping later in 11/23.    Patient presented for dressing change today and was markedly SOB and volume overloaded. On 3/8 had presyncopal episode in CR and got 500cc IVF in [facility] ED.  Says he skipped torsemide yesterday (says he only missed one day). Denies any other issues. In clinic ReDS 42%.  No fevers, chills or problems with driveline. No bleeding, melena or neuro symptoms. No VAD alarms.    CXR shows pulmonary edema.  We placed IV and gave him lasix 80mg  IV, metolazone 2.5 and kcl 40. Rapidly had over 1L urine out with marked improvement in symptoms.     PMH: 1. Atrial fibrillation/flutter: Mainly flutter.  - 5/23 Atrial flutter ablation.  2. H/o DVT 3. Chronic systolic CHF: Nonischemic cardiomyopathy.  Medtronic ICD.  - Echo (11/20): EF < 10% - Cath (12/20): Normal coronaries.  - Echo (8/22): EF 30%, mildly decreased RV systolic function.  - Echo (2/23): EF 20-25%, normal RV.  - RHC (11/23): mean RA 14, PA 66/53 mean 54, mean PCWP 43, CI 1.62 F/1.31 T - HM3 LVAD with LAA clipping on 07/04/22.  4. Prior cocaine abuse.  5. Prior heavy ETOH   SH: Patient was a heavy drinker, abused cocaine, and smoked in the past but quit in 2020.  Lives in [place]. He does part-time custodial work.    FH: Mother with PPM, aunt with CHF.    VAD Indication: Destination Therapy due to uncontrolled diabetes   LVAD assessment: HM III: VAD Speed: 5600 rpms Flow: 4.6 Power: 4.2w    PI: 3.4 Hct: 30    Alarms: none Events: 0-5  Fixed speed: 5500 Low speed limit: 5300   Primary controller: back up battery due for replacement in  24 months Secondary controller:  back up battery due for replacement in 24 months   I reviewed the LVAD parameters from today and compared the results to the  patient's prior recorded data. LVAD interrogation was NEGATIVE for significant power changes, NEGATIVE for clinical alarms and STABLE for PI events/speed drops. No programming changes were made and pump is functioning within specified parameters. Pt is performing daily controller and system monitor self tests along with completing weekly and monthly maintenance for LVAD equipment.   LVAD equipment check completed and is in good working order. Back-up equipment present. Charged back up battery and performed self-test on equipment.           ROS: All systems negative except as listed in HPI, PMH and Problem List.  Current Outpatient Medications  Medication Sig Dispense Refill   atorvastatin (LIPITOR) 20 MG tablet Take 1 tablet (20 mg total) by mouth daily. 90 tablet 3   Calcium Carb-Cholecalciferol 600-10 MG-MCG TABS Take 1 tablet by mouth daily with breakfast. 90 tablet 3   Continuous Blood Gluc Receiver (FREESTYLE LIBRE 3 READER) DEVI 1 each by Does not apply route daily. 2 each 3   Continuous Blood Gluc Sensor (FREESTYLE LIBRE 3 SENSOR) MISC 1 each by Does not apply route daily. Place 1 sensor on the skin every 14 days. Use to check glucose continuously 2 each 3   digoxin (LANOXIN) 0.125 MG tablet Take 0.5 tablets (0.0625 mg total) by mouth daily. 30 tablet 5   empagliflozin (JARDIANCE) 10 MG TABS tablet Take 1 tablet (10 mg total)  by mouth daily. 30 tablet 6   insulin glargine (SEMGLEE, YFGN,) 100 UNIT/ML Solostar Pen Inject 22 Units into the skin at bedtime. 15 mL 0   insulin glargine-yfgn (SEMGLEE) 100 UNIT/ML injection Inject 0.13 mLs (13 Units total) into the skin daily. 10 mL 11   insulin lispro (HUMALOG KWIKPEN) 100 UNIT/ML KwikPen Use 4 units three times a day with meals 15 mL 0   Insulin Pen Needle (PEN NEEDLES) 32G X 4 MM MISC Use with insulin pen with breakfast, with lunch, and with evening meal. 100 each 0   levothyroxine (SYNTHROID) 25 MCG tablet Take 1 tablet (25 mcg total) by  mouth daily before breakfast. 90 tablet 3   magnesium oxide (MAG-OX) 400 (240 Mg) MG tablet Take 1 tablet (400 mg total) by mouth 2 (two) times daily. 180 tablet 3   Multiple Vitamin (MULTIVITAMIN WITH MINERALS) TABS tablet Take 1 tablet by mouth daily.     pantoprazole (PROTONIX) 40 MG tablet Take 1 tablet (40 mg total) by mouth daily. 90 tablet 3   potassium chloride SA (KLOR-CON M) 20 MEQ tablet Take 2 tablets (40 mEq total) by mouth daily. 180 tablet 3   sertraline (ZOLOFT) 50 MG tablet Take 1 tablet (50 mg total) by mouth daily. 90 tablet 3   sildenafil (REVATIO) 20 MG tablet Take 1 tablet (20 mg total) by mouth 3 (three) times daily. 90 tablet 6   spironolactone (ALDACTONE) 25 MG tablet Take 1 tablet (25 mg total) by mouth daily. 90 tablet 3   tiotropium (SPIRIVA HANDIHALER) 18 MCG inhalation capsule Place 1 capsule (18 mcg total) into inhaler and inhale daily. 30 capsule 12   torsemide (DEMADEX) 20 MG tablet Take 4 tablets (80 mg total) by mouth daily. Take 80 mg (4 pills) alternating with 60 mg (3 pills) (Patient taking differently: Take 80 mg by mouth daily. Take 80 mg (4 pills) daily) 180 tablet 3   warfarin (COUMADIN) 3 MG tablet Take 6 mg (2 tablets) every Tuesday/Thursday/Saturday and 3 mg (1 tablet) all other days or as directed by heart failure clinic 150 tablet 3   acetaminophen (TYLENOL) 325 MG tablet Take 1-2 tablets (325-650 mg total) by mouth every 4 (four) hours as needed for mild pain. (Patient not taking: Reported on 10/10/2022)     enoxaparin (LOVENOX) 40 MG/0.4ML injection Inject 0.4 mLs (40 mg total) into the skin every 12 (twelve) hours. (Patient not taking: Reported on 10/10/2022) 4 mL 0   Fe Fum-Vit C-Vit B12-FA (TRIGELS-F FORTE) CAPS capsule Take 1 capsule by mouth daily after breakfast. (Patient not taking: Reported on 10/10/2022) 30 capsule 6   No current facility-administered medications for this encounter.    Patient has no known allergies.  REVIEW OF SYSTEMS: All  systems negative except as listed in HPI, PMH and Problem list.   LVAD INTERROGATION:  See LVAD nurse's note above.   I reviewed the LVAD parameters from today, and compared the results to the patient's prior recorded data.  No programming changes were made.  The LVAD is functioning within specified parameters.  The patient performs LVAD self-test daily.  LVAD interrogation was negative for any significant power changes, alarms or PI events/speed drops.  LVAD equipment check completed and is in good working order.  Back-up equipment present.   LVAD education done on emergency procedures and precautions and reviewed exit site care.    Vitals:   11/16/22 1246  BP: 133/66  Pulse: 79  SpO2: 97%  Weight: 94.6 kg (208  lb 9.6 oz)   Vital Signs:  Automatic BP: 133/66 (103) HR: 79 SPO2: 97 % RA Weight: 208.6 lb w/ eqt Last weight: 197.6 lb w/ ept BMI today 29.1 today  Physical Exam: General: SOB with exertion HEENT: normal  Neck: supple. JVP 10  Carotids 2+ bilat; no bruits. No lymphadenopathy or thryomegaly appreciated. Cor: LVAD hum.  Lungs: + basilar crackles Abdomen: obese soft, nontender, non-distended. No hepatosplenomegaly. No bruits or masses. Good bowel sounds. Driveline site clean. Anchor in place.  Extremities: no cyanosis, clubbing, rash. Warm tr-1+ edema  Neuro: alert & oriented x 3. No focal deficits. Moves all 4 without problem    ASSESSMENT AND PLAN:   1. Acute on Chronic systolic CHF: Nonischemic cardiomyopathy, diagnosed 2020.  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  Cath in 12/20 with no significant coronary disease.  Medtronic ICD. Echo in 2/23 showed EF 20-25% with normal RV.  [place] in 11/13 showed markedly elevated filling pressures, primarily pulmonary venous hypertension, low cardiac  output, and low PAPI. Echo in 11/23 with EF <20%, mod RV dysfunction.  He was admitted and started on milrinone with improvement in hemodynamics.  We proceeded with HM3 LVAD with LAA clip on 07/04/22.   - He is volume overloaded today in setting of missing just one dose of torsemide by his report. NYHA III-IIIB - Treated as above with IV lasix, metolazone and Kcl with marked improvement in symptoms. No other clear triggers, - Continue torsemide to 80 daily alternating with 60 daily.  He will see Dr. Aundra Dubin back on Monday for ramp echo and can adjust diuretics as needed - Continue Jardiance 10 mg daily.  - Continue sildenafil 20 tid for RV.  - Continue spironolactone 25 mg daily.  - Continue digoxin 0.125 daily, check level today.  - Continue cardiac rehab.   2. Atrial fibrillation/atrial flutter: H/o flutter ablation.  Had AF/RVR post-LVAD, went back into NSR.  - In NSR today - Continue amiodarone 100 mg daily, recent LFTs and TSH normal.  He will need regular eye exam.  Ultimately, will try to stop amiodarone.  - Continue warfarin.  3. VAD management: s/p HM-3 VAD on 07/04/22. Ramp echo 07/12/22 RV mild to mod HK. Speed turned to 5400.  - MAPS ok today - VAD interrogated personally. Parameters stable. - Now off ASA.  - Continue warfarin, INR goal 2-2.5. - Will repeat ramp echo nect week 4. H/o DVT: On anticoagulation.  5. CKD stage 3: On Jardiance.  - BMET today.  6. Hypothyroidism - On levothyroxine.  7. Anemia: post-op.  - CBC today.   Glori Bickers 11/16/2022

## 2022-11-17 ENCOUNTER — Other Ambulatory Visit (HOSPITAL_COMMUNITY): Payer: Self-pay | Admitting: *Deleted

## 2022-11-17 ENCOUNTER — Encounter (HOSPITAL_COMMUNITY): Payer: 59

## 2022-11-17 DIAGNOSIS — Z7901 Long term (current) use of anticoagulants: Secondary | ICD-10-CM

## 2022-11-17 DIAGNOSIS — Z95811 Presence of heart assist device: Secondary | ICD-10-CM

## 2022-11-20 ENCOUNTER — Ambulatory Visit (HOSPITAL_COMMUNITY)
Admission: RE | Admit: 2022-11-20 | Discharge: 2022-11-20 | Disposition: A | Discharge status: Home / Self Care | Payer: 59 | Source: Ambulatory Visit | Attending: Cardiology | Admitting: Cardiology

## 2022-11-20 ENCOUNTER — Encounter (HOSPITAL_COMMUNITY): Payer: Self-pay | Admitting: Cardiology

## 2022-11-20 ENCOUNTER — Ambulatory Visit (HOSPITAL_COMMUNITY): Payer: Self-pay | Admitting: Pharmacist

## 2022-11-20 ENCOUNTER — Ambulatory Visit (HOSPITAL_BASED_OUTPATIENT_CLINIC_OR_DEPARTMENT_OTHER)
Admission: RE | Admit: 2022-11-20 | Discharge: 2022-11-20 | Disposition: A | Discharge status: Home / Self Care | Payer: 59 | Source: Ambulatory Visit | Attending: Internal Medicine | Admitting: Internal Medicine

## 2022-11-20 ENCOUNTER — Encounter (HOSPITAL_COMMUNITY): Payer: 59

## 2022-11-20 DIAGNOSIS — Z79899 Other long term (current) drug therapy: Secondary | ICD-10-CM | POA: Diagnosis not present

## 2022-11-20 DIAGNOSIS — J449 Chronic obstructive pulmonary disease, unspecified: Secondary | ICD-10-CM | POA: Diagnosis not present

## 2022-11-20 DIAGNOSIS — I428 Other cardiomyopathies: Secondary | ICD-10-CM | POA: Insufficient documentation

## 2022-11-20 DIAGNOSIS — Z86718 Personal history of other venous thrombosis and embolism: Secondary | ICD-10-CM | POA: Insufficient documentation

## 2022-11-20 DIAGNOSIS — E1122 Type 2 diabetes mellitus with diabetic chronic kidney disease: Secondary | ICD-10-CM | POA: Diagnosis not present

## 2022-11-20 DIAGNOSIS — Z7984 Long term (current) use of oral hypoglycemic drugs: Secondary | ICD-10-CM | POA: Insufficient documentation

## 2022-11-20 DIAGNOSIS — Z79890 Hormone replacement therapy (postmenopausal): Secondary | ICD-10-CM | POA: Diagnosis not present

## 2022-11-20 DIAGNOSIS — I5023 Acute on chronic systolic (congestive) heart failure: Secondary | ICD-10-CM | POA: Diagnosis not present

## 2022-11-20 DIAGNOSIS — D631 Anemia in chronic kidney disease: Secondary | ICD-10-CM | POA: Insufficient documentation

## 2022-11-20 DIAGNOSIS — Z794 Long term (current) use of insulin: Secondary | ICD-10-CM | POA: Insufficient documentation

## 2022-11-20 DIAGNOSIS — E039 Hypothyroidism, unspecified: Secondary | ICD-10-CM | POA: Diagnosis not present

## 2022-11-20 DIAGNOSIS — I4891 Unspecified atrial fibrillation: Secondary | ICD-10-CM | POA: Diagnosis present

## 2022-11-20 DIAGNOSIS — Z95811 Presence of heart assist device: Secondary | ICD-10-CM

## 2022-11-20 DIAGNOSIS — I509 Heart failure, unspecified: Secondary | ICD-10-CM

## 2022-11-20 DIAGNOSIS — I272 Pulmonary hypertension, unspecified: Secondary | ICD-10-CM | POA: Insufficient documentation

## 2022-11-20 DIAGNOSIS — Z87891 Personal history of nicotine dependence: Secondary | ICD-10-CM | POA: Diagnosis not present

## 2022-11-20 DIAGNOSIS — I130 Hypertensive heart and chronic kidney disease with heart failure and stage 1 through stage 4 chronic kidney disease, or unspecified chronic kidney disease: Secondary | ICD-10-CM | POA: Insufficient documentation

## 2022-11-20 DIAGNOSIS — Z7901 Long term (current) use of anticoagulants: Secondary | ICD-10-CM | POA: Diagnosis not present

## 2022-11-20 DIAGNOSIS — I5022 Chronic systolic (congestive) heart failure: Secondary | ICD-10-CM | POA: Insufficient documentation

## 2022-11-20 DIAGNOSIS — N183 Chronic kidney disease, stage 3 unspecified: Secondary | ICD-10-CM | POA: Insufficient documentation

## 2022-11-20 DIAGNOSIS — I4892 Unspecified atrial flutter: Secondary | ICD-10-CM | POA: Diagnosis not present

## 2022-11-20 LAB — PROTIME-INR
INR: 1.8 — ABNORMAL HIGH (ref 0.8–1.2)
Prothrombin Time: 20.4 seconds — ABNORMAL HIGH (ref 11.4–15.2)

## 2022-11-20 LAB — CBC
HCT: 38.6 % — ABNORMAL LOW (ref 39.0–52.0)
Hemoglobin: 11 g/dL — ABNORMAL LOW (ref 13.0–17.0)
MCH: 19.6 pg — ABNORMAL LOW (ref 26.0–34.0)
MCHC: 28.5 g/dL — ABNORMAL LOW (ref 30.0–36.0)
MCV: 68.9 fL — ABNORMAL LOW (ref 80.0–100.0)
Platelets: 440 10*3/uL — ABNORMAL HIGH (ref 150–400)
RBC: 5.6 MIL/uL (ref 4.22–5.81)
RDW: 24.4 % — ABNORMAL HIGH (ref 11.5–15.5)
WBC: 7.5 10*3/uL (ref 4.0–10.5)
nRBC: 0 % (ref 0.0–0.2)

## 2022-11-20 LAB — COMPREHENSIVE METABOLIC PANEL
ALT: 38 U/L (ref 0–44)
AST: 41 U/L (ref 15–41)
Albumin: 3.7 g/dL (ref 3.5–5.0)
Alkaline Phosphatase: 157 U/L — ABNORMAL HIGH (ref 38–126)
Anion gap: 10 (ref 5–15)
BUN: 26 mg/dL — ABNORMAL HIGH (ref 6–20)
CO2: 27 mmol/L (ref 22–32)
Calcium: 9 mg/dL (ref 8.9–10.3)
Chloride: 97 mmol/L — ABNORMAL LOW (ref 98–111)
Creatinine, Ser: 1.32 mg/dL — ABNORMAL HIGH (ref 0.61–1.24)
GFR, Estimated: >60 mL/min (ref >60)
Glucose, Bld: 174 mg/dL — ABNORMAL HIGH (ref 70–99)
Potassium: 3.8 mmol/L (ref 3.5–5.1)
Sodium: 134 mmol/L — ABNORMAL LOW (ref 135–145)
Total Bilirubin: 0.5 mg/dL (ref 0.3–1.2)
Total Protein: 8 g/dL (ref 6.5–8.1)

## 2022-11-20 LAB — MAGNESIUM: Magnesium: 2.1 mg/dL (ref 1.7–2.4)

## 2022-11-20 LAB — ECHOCARDIOGRAM LIMITED: Est EF: <20

## 2022-11-20 LAB — DIGOXIN LEVEL: Digoxin Level: <0.2 ng/mL — ABNORMAL LOW (ref 0.8–2.0)

## 2022-11-20 LAB — LACTATE DEHYDROGENASE: LDH: 258 U/L — ABNORMAL HIGH (ref 98–192)

## 2022-11-20 MED ORDER — TORSEMIDE 20 MG PO TABS: 60.0000 mg | ORAL_TABLET | Freq: Every day | ORAL | 3 refills | Status: DC

## 2022-11-20 NOTE — Patient Instructions (Addendum)
Increase Torsemide to 60 mg (3 tablets) in the morning and 40 mg (2 tablets) in the afternoon. If you have increased shortness of breath, feel like you are holding on to fluid, or feel poorly notify VAD coordinators immediately.  Continue changing drive line dressing twice a week. If redness, drainage, tenderness, or foul odor occurs notify VAD coordinators immediately.  Coumadin dosing per Lauren PharmD Return to clinic in 1 month

## 2022-11-20 NOTE — Progress Notes (Addendum)
Pt presents for 6 week f/u with RAMP echo in VAD clinic today alone. Denies issues with VAD equipment or drive line.   Reports he is feeling better since he was seen last week. He brought medications today for VAD coordinator review. Only med bottle missing was Digoxin- he reports this medication is in his pill cutter at home, and he forgot to bring bottle. Reports he is not missing medications, and taking everything as prescribed. Continues to take Torsemide 80 mg at night despite being advised to take in the mornings to increase compliance. Weight down 6 lbs today. Still has some fluid onboard per Dr Aundra Dubin. Will increase to Torsemide 60 mg in the morning and 40 mg in the afternoon. Updated prescription sent to pt's pharmacy. Advised to notify VAD coordinators immediately if he experiences increased shortness of breath, fluid retention, or feeling poorly. He verbalized understanding.   Reports shortness of breath has improved with taking Torsemide daily. Reports intermittent lightheadedness and dizziness when he is standing up to get out of the car, but this resolves with rest. States he is drinking 2 L per day. Denies falls and signs of bleeding.   Ramp echo completed in clinic today. Speed decreased to 5400. Low speed 5100. See separate note for documentation.  Drive line dressing changed today. See documentation below. Advised pt to continue changing dressing twice a week. Pt may NOT shower as drive line is not incorporated, nor does he have shower bag. Instructed to notify VAD coordinators if redness, increased drainage, tenderness, or foul odor occurs. He verbalized understanding.   Vital Signs:  Doppler: 92 Automatic BP: 104/74 (83) HR: 78 SR w/ occ PVCs SPO2: UTO % RA  Weight: 202.4 lb w/ eqt Last weight: 208.6 lb w/ ept BMI today 28.23 today  VAD Indication: Destination Therapy due to uncontrolled diabetes   LVAD assessment: HM III: VAD Speed: 5500 rpms Flow: 4.9 Power: 4.2w     PI: 2.9 Hct: 30   Alarms: none Events: 0-5  Fixed speed: 5500 Low speed limit: 5200  Primary controller: back up battery due for replacement in  22 months Secondary controller:  back up battery due for replacement in 24 months  I reviewed the LVAD parameters from today and compared the results to the patient's prior recorded data. LVAD interrogation was NEGATIVE for significant power changes, NEGATIVE for clinical alarms and STABLE for PI events/speed drops. No programming changes were made and pump is functioning within specified parameters. Pt is performing daily controller and system monitor self tests along with completing weekly and monthly maintenance for LVAD equipment.   LVAD equipment check completed and is in good working order. Back-up equipment not present today. Charged back up battery and performed self-test on equipment.    Annual Equipment Maintenance on UBC/PM was performed on 06/2022.   Exit Site Care: Existing VAD dressing removed and site care performed using sterile technique. Drive line exit site cleaned with Chlora prep applicators x 2, allowed to dry, and gauze dressing applied. Exit site healing and partially incorporated, the velour is fully implanted at exit site. Burning noted with cleansing, scant serous drainage on previous dressing, no tenderness, foul odor, or rash noted. Drive line anchor re-applied. Pt denies fever or chills. To continue twice weekly dressing changes. Advised to allow [name] to perform or assist to avoid infection, but pt insists he will perform his own dressing change. Instructed to call VAD Coordinator if he notices increases drainage, redness, or tenderness at site. He verbalized  understanding. Pt provided with 14 daily kits and 14 anchors for home use.      Significant Events on VAD Support:  none   Device: N/A    BP & Labs:  Doppler 92 - Doppler is reflecting MAP   Hgb 11.0 - No S/S of bleeding. Specifically denies  melena/BRBPR or nosebleeds.   LDH stable at 258 - Denies tea-colored urine. No power elevations noted on interrogation.   Patient Instructions: Increase Torsemide to 60 mg (3 tablets) in the morning and 40 mg (2 tablets) in the afternoon. If you have increased shortness of breath, feel like you are holding on to fluid, or feel poorly notify VAD coordinators immediately.  Continue changing drive line dressing twice a week. If redness, drainage, tenderness, or foul odor occurs notify VAD coordinators immediately.  Coumadin dosing per Lauren PharmD Return to clinic in 1 month  Darryl Monte RN [place] Coordinator  Office: [phone]  24/7 Pager: [phone]     ADVANCED HF/VAD CLINIC NOTE   Darryl Frye is a 62 y.o. with history of atrial fibrillation/flutter and nonischemic cardiomyopathy who was initially referred by Dr. Harl Bowie for evaluation of CHF.  Patient was diagnosed with CHF in 2020 at [facility] in [place].  At the time, he drank heavily and used cocaine.  He stopped drinking, smoking, and cocaine in 2020 and has not used any of these substances since then. Echo in 11/20 showed EF < 10%, cath in 12/20 showed normal coronaries.  Echo has shown persistently low EF over time, echo in 2/23 showed EF 20-25%.  He has a Medtronic ICD.      Patient was admitted in 2/23 with atrial flutter/RVR. He was started on amiodarone and converted to NSR.  He had atrial flutter ablation in 5/23 and amiodarone was stopped  Echo was done in 11/23 showing elevated filling pressure and markedly low cardiac output.  Patient was admitted, started on milrinone, and underwent HM3 LVAD placement with LAA clipping later in 11/23.  Post-op course complicated by acute hypoxemic respiratory failure post-op and PNA.  Patient went to CIR after acute hospital course.   Ramp echo was done today, at 5500 bpm, the IV septum was shifted mildly to the left with moderate RV dilation and dysfunction, AoV opened rarely.  Speed  decreased to 5400 bpm, slight opening AoV every beat, trivial AI, midline IV septum.  Speed left at 5400 bpm.   Returns for LVAD followup. He was seen last week and was given IV Lasix and metolazone, he had missed at least 1 dose of his torsemide.  Weight today is down 6 lbs.  MAP is stable at 83.  He finishes cardiac rehab this week.  He seems to be doing reasonably well, still gets short of breath if he "hurries," but generally feels comfortable.  No lightheadedness.      Labs (12/23): K 4.3, creatinine 1.25 => 1.45, Na 128, hgb 8.8 => 9.6, LDH 211, LFTs normal, TSH normal, digoxin 0.8 Labs (1/24): K 3.9, creatinine 1.32, LDH 224, hgb 10.4 Labs (2/24): K 4.5, creatinine 1.33 Labs (4/24): creatinine 1.16, hgb 10.9  PMH: 1. Atrial fibrillation/flutter: Mainly flutter.  - 5/23 Atrial flutter ablation.  2. H/o DVT 3. Chronic systolic CHF: Nonischemic cardiomyopathy.  Medtronic ICD.  - Echo (11/20): EF < 10% - Cath (12/20): Normal coronaries.  - Echo (8/22): EF 30%, mildly decreased RV systolic function.  - Echo (2/23): EF 20-25%, normal RV.  - RHC (11/23): mean RA 14, PA 66/53 mean  54, mean PCWP 43, CI 1.62 F/1.31 T - HM3 LVAD with LAA clipping on 07/04/22.  - Ramp echo (2/24): Speed increased to 5500 rpm.  At this speed, IV septum was midline.  Aortic valve opened 1/3 beats.  Mild RV enlargement with moderate RV dysfunction.  - Ramp echo (3/24): Speed decreased back to 5400 rpm as the IV septum was shifted left.  Midline septum at 5400 rpm.  4. Prior cocaine abuse.  5. Prior heavy ETOH 6. COPD: Moderate obstruction on 11/23 PFTs. No longer smokes.    SH: Patient was a heavy drinker, abused cocaine, and smoked in the past but quit in 2020.  Lives in [place]. He does part-time custodial work.    FH: Mother with PPM, aunt with CHF.    VAD Indication: Destination Therapy due to uncontrolled diabetes   LVAD assessment: HM III: See LVAD nurse's note above.    I reviewed the LVAD  parameters from today and compared the results to the patient's prior recorded data. LVAD interrogation was NEGATIVE for significant power changes, NEGATIVE for clinical alarms and STABLE for PI events/speed drops. No programming changes were made and pump is functioning within specified parameters. Pt is performing daily controller and system monitor self tests along with completing weekly and monthly maintenance for LVAD equipment.   LVAD equipment check completed and is in good working order. Back-up equipment present. Charged back up battery and performed self-test on equipment.    Annual Equipment Maintenance on UBC/PM was performed on 06/2022.      ROS: All systems negative except as listed in HPI, PMH and Problem List.  Current Outpatient Medications  Medication Sig Dispense Refill   atorvastatin (LIPITOR) 20 MG tablet Take 1 tablet (20 mg total) by mouth daily. 90 tablet 3   Calcium Carb-Cholecalciferol 600-10 MG-MCG TABS Take 1 tablet by mouth daily with breakfast. 90 tablet 3   Continuous Blood Gluc Receiver (FREESTYLE LIBRE 3 READER) DEVI 1 each by Does not apply route daily. 2 each 3   Continuous Blood Gluc Sensor (FREESTYLE LIBRE 3 SENSOR) MISC 1 each by Does not apply route daily. Place 1 sensor on the skin every 14 days. Use to check glucose continuously 2 each 3   digoxin (LANOXIN) 0.125 MG tablet Take 0.5 tablets (0.0625 mg total) by mouth daily. 30 tablet 5   empagliflozin (JARDIANCE) 10 MG TABS tablet Take 1 tablet (10 mg total) by mouth daily. 30 tablet 6   insulin glargine (SEMGLEE, YFGN,) 100 UNIT/ML Solostar Pen Inject 22 Units into the skin at bedtime. 15 mL 0   insulin glargine-yfgn (SEMGLEE) 100 UNIT/ML injection Inject 0.13 mLs (13 Units total) into the skin daily. 10 mL 11   insulin lispro (HUMALOG KWIKPEN) 100 UNIT/ML KwikPen Use 4 units three times a day with meals (Patient taking differently: 10 Units 3 (three) times daily. Use 10 units three times a day with meals)  15 mL 0   Insulin Pen Needle (PEN NEEDLES) 32G X 4 MM MISC Use with insulin pen with breakfast, with lunch, and with evening meal. 100 each 0   levothyroxine (SYNTHROID) 25 MCG tablet Take 1 tablet (25 mcg total) by mouth daily before breakfast. 90 tablet 3   magnesium oxide (MAG-OX) 400 (240 Mg) MG tablet Take 1 tablet (400 mg total) by mouth 2 (two) times daily. 180 tablet 3   Multiple Vitamin (MULTIVITAMIN WITH MINERALS) TABS tablet Take 1 tablet by mouth daily.     pantoprazole (PROTONIX) 40 MG tablet  Take 1 tablet (40 mg total) by mouth daily. 90 tablet 3   potassium chloride SA (KLOR-CON M) 20 MEQ tablet Take 2 tablets (40 mEq total) by mouth daily. 180 tablet 3   sertraline (ZOLOFT) 50 MG tablet Take 1 tablet (50 mg total) by mouth daily. 90 tablet 3   sildenafil (REVATIO) 20 MG tablet Take 1 tablet (20 mg total) by mouth 3 (three) times daily. 90 tablet 6   spironolactone (ALDACTONE) 25 MG tablet Take 1 tablet (25 mg total) by mouth daily. 90 tablet 3   tiotropium (SPIRIVA HANDIHALER) 18 MCG inhalation capsule Place 1 capsule (18 mcg total) into inhaler and inhale daily. 30 capsule 12   warfarin (COUMADIN) 3 MG tablet Take 6 mg (2 tablets) every Tuesday/Thursday/Saturday and 3 mg (1 tablet) all other days or as directed by heart failure clinic 150 tablet 3   acetaminophen (TYLENOL) 325 MG tablet Take 1-2 tablets (325-650 mg total) by mouth every 4 (four) hours as needed for mild pain. (Patient not taking: Reported on 10/10/2022)     enoxaparin (LOVENOX) 40 MG/0.4ML injection Inject 0.4 mLs (40 mg total) into the skin every 12 (twelve) hours. (Patient not taking: Reported on 10/10/2022) 4 mL 0   Fe Fum-Vit C-Vit B12-FA (TRIGELS-F FORTE) CAPS capsule Take 1 capsule by mouth daily after breakfast. (Patient not taking: Reported on 10/10/2022) 30 capsule 6   torsemide (DEMADEX) 20 MG tablet Take 3 tablets (60 mg total) by mouth daily. Take 3 tablets (60 mg) in the morning, and 2 tablets (40 mg) in the  afternoon, or as directed by the heart failure clinic 180 tablet 3   No current facility-administered medications for this encounter.    Patient has no known allergies.  REVIEW OF SYSTEMS: All systems negative except as listed in HPI, PMH and Problem list.   LVAD INTERROGATION:  See LVAD nurse's note above.   I reviewed the LVAD parameters from today, and compared the results to the patient's prior recorded data.  No programming changes were made.  The LVAD is functioning within specified parameters.  The patient performs LVAD self-test daily.  LVAD interrogation was negative for any significant power changes, alarms or PI events/speed drops.  LVAD equipment check completed and is in good working order.  Back-up equipment present.   LVAD education done on emergency procedures and precautions and reviewed exit site care.    Vitals:   11/20/22 1022 11/20/22 1041  BP: (!) 92/0 104/74  Pulse: 78   Weight: 91.8 kg (202 lb 6.4 oz)    Vital Signs:  BP 104/74 Comment: map 83  Pulse 78 Comment: SR occ PVC  Wt 91.8 kg (202 lb 6.4 oz)   BMI 28.23 kg/m  MAP 83 Physical Exam: General: Well appearing this am. NAD.  HEENT: Normal. Neck: Supple, JVP 8-9 cm. Carotids OK.  Cardiac:  Mechanical heart sounds with LVAD hum present.  Lungs:  CTAB, normal effort.  Abdomen:  NT, ND, no HSM. No bruits or masses. +BS  LVAD exit site: Well-healed and incorporated. Dressing dry and intact. No erythema or drainage. Stabilization device present and accurately applied. Driveline dressing changed daily per sterile technique. Extremities:  Warm and dry. No cyanosis, clubbing, rash, or edema.  Neuro:  Alert & oriented x 3. Cranial nerves grossly intact. Moves all 4 extremities w/o difficulty. Affect pleasant    ASSESSMENT AND PLAN:   1. Chronic systolic CHF: Nonischemic cardiomyopathy, diagnosed 2020.  At the time, he drank heavily and used  cocaine, so it is possible that this is a substance abuse-related  cardiomyopathy though LV function has remained low even with stopping ETOH and cocaine (denies use x several years).  Cath in 12/20 with no significant coronary disease.  Medtronic ICD. Echo in 2/23 showed EF 20-25% with normal RV.  [place] in 11/13 showed markedly elevated filling pressures, primarily pulmonary venous hypertension, low cardiac output, and low PAPI. Echo in 11/23 with EF <20%, mod RV dysfunction.  He was admitted and started on milrinone with improvement in hemodynamics.  We proceeded with HM3 LVAD with LAA clip on 07/04/22.  NYHA class II, still with mild volume overload on exam today. I also suspect that some of his dyspnea is due to COPD. Ramp echo done today, speed decreased to 5400 rpm.  - Increase torsemide to 60 qam/40 qpm.  BMET today and in 10 days.  - Continue Jardiance 10 mg daily.  - Continue sildenafil 20 tid for RV.  - Continue spironolactone 25 mg daily.  - Continue digoxin 0.0625 daily, check level today.  - Will be finishing up CR.  2. Atrial fibrillation/atrial flutter: H/o flutter ablation.  Had AF/RVR post-LVAD, went back into NSR.  He is now off amiodarone.   - Continue warfarin.  3. VAD management: s/p HM-3 VAD on 07/04/22. Ramp echo today, speed decreased to 5400 rpm.  Stable LVAD parameters.  - Now off ASA.  - Continue warfarin, INR goal 2-2.5. 4. H/o DVT: On anticoagulation.  5. CKD stage 3: On Jardiance.  - BMET today.  6. Hypothyroidism - On levothyroxine.  7. Anemia: post-op.  - CBC today.  8. COPD: Moderate on 11/23 PFTs.  Suspect this plays a role in his dyspnea.  - Continue Spiriva.  - Referred to pulmonary in [place].   Followup in 1 month.   Loralie Champagne 11/20/2022

## 2022-11-20 NOTE — Progress Notes (Signed)
Speed  Flow  PI  Power  LVIDD  AI  Aortic opening MR  TR  Septum  RV  VTI (>18cm)  5500 4.9  4.2 2.9 5.95 trivial 1/5 trivial mild Bow left Moderately down     5400 4.8 3.0 4.0 5.59 trivial 5/5 (slight) trivial mild Sl. bow left Moderately down                                                            Doppler MAP: 92 Auto cuff BP: 104/74 (83)   Ramp ECHO performed at bedside per Dr Aundra Dubin  At completion of ramp study, patients primary controller programmed:  Fixed speed: 5400 Low speed limit: 5100   Emerson Monte RN [place] Coordinator  Office: [phone]  24/7 Pager: [phone]

## 2022-11-22 ENCOUNTER — Encounter (HOSPITAL_COMMUNITY): Payer: 59

## 2022-11-24 ENCOUNTER — Encounter (HOSPITAL_COMMUNITY): Payer: 59

## 2022-11-28 NOTE — Progress Notes (Signed)
Discharge Progress Report  Patient Details  Name: Darryl Frye MRN: [id number] Date of Birth: 03-20-1961 Referring Provider:   [name] from 08/28/2022 in [facility] CARDIAC REHABILITATION  Referring Provider Dr. Delia Chimes        Number of Visits: 22  Reason for Discharge:  Early Exit:  Lack of attendance  Smoking History:  Social History   Tobacco Use  Smoking Status Former   Packs/day: 1.00   Years: 20.00   Additional pack years: 0.00   Total pack years: 20.00   Types: Cigarettes   Quit date: 07/07/2019   Years since quitting: 3.3  Smokeless Tobacco Never    Diagnosis:  Chronic combined systolic and diastolic heart failure (HCC)  LVAD (left ventricular assist device) present (HCC)  ADL [facility]:   Initial Exercise Prescription:  Initial Exercise Prescription - 08/28/22 1500       Date of Initial Exercise RX and Referring Provider   Date 08/28/22    Referring Provider Dr. Delia Chimes    Expected Discharge Date 11/24/22      NuStep   Level 1    SPM 60    Minutes 39      Prescription Details   Frequency (times per week) 3    Duration Progress to 30 minutes of continuous aerobic without signs/symptoms of physical distress      Intensity   THRR 40-80% of Max Heartrate 64-129    Ratings of Perceived Exertion 11-13      Resistance Training   Training Prescription Yes    Weight 3    Reps 10-15             Discharge Exercise Prescription (Final Exercise Prescription Changes):  Exercise Prescription Changes - 11/10/22 1501       Response to Exercise   Blood Pressure (Admit) --   92 MAP   Blood Pressure (Exercise) --   90 MAP   Blood Pressure (Exit) --   80 MAP   Heart Rate (Admit) 80 bpm    Heart Rate (Exercise) 119 bpm    Heart Rate (Exit) 90 bpm    Rating of Perceived Exertion (Exercise) 12    Duration Continue with 30 min of aerobic exercise without signs/symptoms of physical distress.    Intensity THRR  unchanged      Progression   Progression Continue to progress workloads to maintain intensity without signs/symptoms of physical distress.      Resistance Training   Training Prescription Yes    Weight 5    Reps 10-15    Time 10 Minutes      Treadmill   MPH 1.5    Grade 0    Minutes 17    METs 2.15      NuStep   Level 4    SPM 96    Minutes 22    METs 2.76             Functional Capacity:  6 Minute Walk     Row Name 08/28/22 1427         6 Minute Walk   Phase Initial     Distance 800 feet     Walk Time 6 minutes     # of Rest Breaks 0     MPH 1.51     METS 2.41     RPE 11     VO2 Peak 8.44     Symptoms No     Resting HR 82 bpm  Resting BP --  76 Doppler     Resting Oxygen Saturation  97 %     Exercise Oxygen Saturation  during 6 min walk 96 %     Max Ex. HR 98 bpm     Max Ex. BP --  86 Doppler     2 Minute Post BP --  80 Doppler              Psychological, QOL, Others - Outcomes: PHQ 2/9:    08/28/2022    1:33 PM  Depression screen PHQ 2/9  Decreased Interest 2  Down, Depressed, Hopeless 0  PHQ - 2 Score 2  Altered sleeping 3  Tired, decreased energy 3  Change in appetite 0  Feeling bad or failure about yourself  0  Trouble concentrating 0  Moving slowly or fidgety/restless 0  Suicidal thoughts 0  PHQ-9 Score 8  Difficult doing work/chores Somewhat difficult    Quality of Life:  Quality of Life - 08/28/22 1534       Quality of Life   Select Quality of Life      Quality of Life Scores   Health/Function Pre 17.15 %    Socioeconomic Pre 26.25 %    Psych/Spiritual Pre 27.5 %    Family Pre 28 %    GLOBAL Pre 22.48 %             Personal Goals: Goals established at orientation with interventions provided to work toward goal.  Personal Goals and Risk Factors at Admission - 08/28/22 1347       Core Components/Risk Factors/Patient Goals on Admission   Improve shortness of breath with ADL's Yes    Intervention Provide  education, individualized exercise plan and daily activity instruction to help decrease symptoms of SOB with activities of daily living.    Expected Outcomes Short Term: Improve cardiorespiratory fitness to achieve a reduction of symptoms when performing ADLs;Maka Term: Be able to perform more ADLs without symptoms or delay the onset of symptoms    Diabetes Yes    Intervention Provide education about signs/symptoms and action to take for hypo/hyperglycemia.;Provide education about proper nutrition, including hydration, and aerobic/resistive exercise prescription along with prescribed medications to achieve blood glucose in normal ranges: Fasting glucose 65-99 mg/dL    Expected Outcomes Short Term: Participant verbalizes understanding of the signs/symptoms and immediate care of hyper/hypoglycemia, proper foot care and importance of medication, aerobic/resistive exercise and nutrition plan for blood glucose control.;Kasler Term: Attainment of HbA1C < 7%.    Heart Failure Yes    Intervention Provide a combined exercise and nutrition program that is supplemented with education, support and counseling about heart failure. Directed toward relieving symptoms such as shortness of breath, decreased exercise tolerance, and extremity edema.    Expected Outcomes Improve functional capacity of life;Short term: Attendance in program 2-3 days a week with increased exercise capacity. Reported lower sodium intake. Reported increased fruit and vegetable intake. Reports medication compliance.;Short term: Daily weights obtained and reported for increase. Utilizing diuretic protocols set by physician.;Osbourne term: Adoption of self-care skills and reduction of barriers for early signs and symptoms recognition and intervention leading to self-care maintenance.    Lipids Yes    Intervention Provide education and support for participant on nutrition & aerobic/resistive exercise along with prescribed medications to achieve LDL 70mg ,  HDL >40mg .    Expected Outcomes Short Term: Participant states understanding of desired cholesterol values and is compliant with medications prescribed. Participant is following exercise prescription and nutrition  guidelines.;Pittmon Term: Cholesterol controlled with medications as prescribed, with individualized exercise RX and with personalized nutrition plan. Value goals: LDL < 70mg , HDL > 40 mg.    Personal Goal Other Yes    Personal Goal Increase strength, stamina, and leg strength.    Intervention Attend CR three days per week and begin a home exercise program.    Expected Outcomes Pt will meet stated goal.              Personal Goals Discharge:  Goals and Risk Factor Review     Row Name 09/11/22 1233 10/09/22 1249 11/06/22 1234         Core Components/Risk Factors/Patient Goals Review   Personal Goals Review Lipids;Diabetes;Improve shortness of breath with ADL's;Heart Failure Lipids;Diabetes;Improve shortness of breath with ADL's;Heart Failure Lipids;Diabetes;Improve shortness of breath with ADL's;Heart Failure     Review Patient was referred to CR with Chronic combined systolic and diastolic heart failure with LVAD. He has multiple risk factors for CAD and is participating in the program for risk modification. He has completed 4 sessions. His current weight is 195.0 lbs. He is doing well in the program. His doppler readings are WNL. His DM is manged with Insulin and Jardiance. His last A1C on file was 11/23 at 9.3%. His personal goals for the program are to decrease his SOB; increase his strength and stamina; and get his legs stronger. We will continue to monitor his progress as he works towards meeting these goals. Patient has completed 12 sessions. His current weight is 196.4 lbs up 1.4 lbs since last 30 day review. He continues to do well in the program with consistent attendance and progressions. His doppler readings are WNL. He saw Mackey Birchwood, PA with cardiology for ICD follow up.  No changes made. Patient did complain of SOB. It was felt he was euvolemic.  His DM continues to be manged with Insulin and Jardiance. His last A1C on file was 11/23 at 9.3%. His personal goals for the program are to decrease his SOB; increase his strength and stamina; and get his legs stronger. We will continue to monitor his progress as he works towards meeting these goals. Patient has completed 18 sessions. His current weight is 203.1 lbs up 6.7 lbs since last 30 day review. He continues to do well in the program with consistent attendance and progressions. His doppler readings are WNL. Patient was sent to the ED 3/8 by our staff after reporting a near syncope episode getting out of his car in the parking lot with urinary incontience. He was treated for hypokalemia in the ED and discharged home. His DM continues to be manged with Insulin and Jardiance. His last A1C on file was 11/23 at 9.3%. His personal goals for the program are to decrease his SOB; increase his strength and stamina; and get his legs stronger. We will continue to monitor his progress as he works towards meeting these goals.     Expected Outcomes Patient will complete the program meeting both personal and program goals. Patient will complete the program meeting both personal and program goals. Patient will complete the program meeting both personal and program goals.              Exercise Goals and Review:  Exercise Goals     Row Name 08/28/22 1532 09/18/22 1500 10/16/22 1500 11/13/22 1504       Exercise Goals   Increase Physical Activity Yes Yes Yes Yes    Intervention Provide advice, education, support  and counseling about physical activity/exercise needs.;Develop an individualized exercise prescription for aerobic and resistive training based on initial evaluation findings, risk stratification, comorbidities and participant's personal goals. Provide advice, education, support and counseling about physical activity/exercise  needs.;Develop an individualized exercise prescription for aerobic and resistive training based on initial evaluation findings, risk stratification, comorbidities and participant's personal goals. Provide advice, education, support and counseling about physical activity/exercise needs.;Develop an individualized exercise prescription for aerobic and resistive training based on initial evaluation findings, risk stratification, comorbidities and participant's personal goals. Provide advice, education, support and counseling about physical activity/exercise needs.;Develop an individualized exercise prescription for aerobic and resistive training based on initial evaluation findings, risk stratification, comorbidities and participant's personal goals.    Expected Outcomes Short Term: Attend rehab on a regular basis to increase amount of physical activity.;Berk Term: Add in home exercise to make exercise part of routine and to increase amount of physical activity.;Torpey Term: Exercising regularly at least 3-5 days a week. Short Term: Attend rehab on a regular basis to increase amount of physical activity.;Musso Term: Add in home exercise to make exercise part of routine and to increase amount of physical activity.;Oliveria Term: Exercising regularly at least 3-5 days a week. Short Term: Attend rehab on a regular basis to increase amount of physical activity.;Buford Term: Add in home exercise to make exercise part of routine and to increase amount of physical activity.;Archuleta Term: Exercising regularly at least 3-5 days a week. Short Term: Attend rehab on a regular basis to increase amount of physical activity.;Everage Term: Add in home exercise to make exercise part of routine and to increase amount of physical activity.;Brunson Term: Exercising regularly at least 3-5 days a week.    Increase Strength and Stamina Yes Yes Yes Yes    Intervention Provide advice, education, support and counseling about physical activity/exercise  needs.;Develop an individualized exercise prescription for aerobic and resistive training based on initial evaluation findings, risk stratification, comorbidities and participant's personal goals. Provide advice, education, support and counseling about physical activity/exercise needs.;Develop an individualized exercise prescription for aerobic and resistive training based on initial evaluation findings, risk stratification, comorbidities and participant's personal goals. Provide advice, education, support and counseling about physical activity/exercise needs.;Develop an individualized exercise prescription for aerobic and resistive training based on initial evaluation findings, risk stratification, comorbidities and participant's personal goals. Provide advice, education, support and counseling about physical activity/exercise needs.;Develop an individualized exercise prescription for aerobic and resistive training based on initial evaluation findings, risk stratification, comorbidities and participant's personal goals.    Expected Outcomes Short Term: Increase workloads from initial exercise prescription for resistance, speed, and METs.;Short Term: Perform resistance training exercises routinely during rehab and add in resistance training at home;Mertens Term: Improve cardiorespiratory fitness, muscular endurance and strength as measured by increased METs and functional capacity ( ) Short Term: Increase workloads from initial exercise prescription for resistance, speed, and METs.;Short Term: Perform resistance training exercises routinely during rehab and add in resistance training at home;Reimers Term: Improve cardiorespiratory fitness, muscular endurance and strength as measured by increased METs and functional capacity ( ) Short Term: Increase workloads from initial exercise prescription for resistance, speed, and METs.;Short Term: Perform resistance training exercises routinely during rehab and add in  resistance training at home;Bann Term: Improve cardiorespiratory fitness, muscular endurance and strength as measured by increased METs and functional capacity ( ) Short Term: Increase workloads from initial exercise prescription for resistance, speed, and METs.;Short Term: Perform resistance training exercises routinely during rehab and add in resistance training at home;Harold Term:  Improve cardiorespiratory fitness, muscular endurance and strength as measured by increased METs and functional capacity ( )    Able to understand and use rate of perceived exertion (RPE) scale Yes Yes Yes Yes    Intervention Provide education and explanation on how to use RPE scale Provide education and explanation on how to use RPE scale Provide education and explanation on how to use RPE scale Provide education and explanation on how to use RPE scale    Expected Outcomes Short Term: Able to use RPE daily in rehab to express subjective intensity level;Lemma Term:  Able to use RPE to guide intensity level when exercising independently Short Term: Able to use RPE daily in rehab to express subjective intensity level;Woodring Term:  Able to use RPE to guide intensity level when exercising independently Short Term: Able to use RPE daily in rehab to express subjective intensity level;Landen Term:  Able to use RPE to guide intensity level when exercising independently Short Term: Able to use RPE daily in rehab to express subjective intensity level;Befort Term:  Able to use RPE to guide intensity level when exercising independently    Knowledge and understanding of Target Heart Rate Range (THRR) Yes Yes Yes Yes    Intervention Provide education and explanation of THRR including how the numbers were predicted and where they are located for reference Provide education and explanation of THRR including how the numbers were predicted and where they are located for reference Provide education and explanation of THRR including how the numbers were  predicted and where they are located for reference Provide education and explanation of THRR including how the numbers were predicted and where they are located for reference    Expected Outcomes Short Term: Able to state/look up THRR;Turton Term: Able to use THRR to govern intensity when exercising independently;Short Term: Able to use daily as guideline for intensity in rehab Short Term: Able to state/look up THRR;Turnbough Term: Able to use THRR to govern intensity when exercising independently;Short Term: Able to use daily as guideline for intensity in rehab Short Term: Able to state/look up THRR;Urquiza Term: Able to use THRR to govern intensity when exercising independently;Short Term: Able to use daily as guideline for intensity in rehab Short Term: Able to state/look up THRR;Englert Term: Able to use THRR to govern intensity when exercising independently;Short Term: Able to use daily as guideline for intensity in rehab    Able to check pulse independently Yes Yes Yes Yes    Intervention Provide education and demonstration on how to check pulse in carotid and radial arteries.;Review the importance of being able to check your own pulse for safety during independent exercise Provide education and demonstration on how to check pulse in carotid and radial arteries.;Review the importance of being able to check your own pulse for safety during independent exercise Provide education and demonstration on how to check pulse in carotid and radial arteries.;Review the importance of being able to check your own pulse for safety during independent exercise Provide education and demonstration on how to check pulse in carotid and radial arteries.;Review the importance of being able to check your own pulse for safety during independent exercise    Expected Outcomes Short Term: Able to explain why pulse checking is important during independent exercise;Kochan Term: Able to check pulse independently and accurately Short Term: Able to  explain why pulse checking is important during independent exercise;Bittinger Term: Able to check pulse independently and accurately Short Term: Able to explain why pulse checking is important during  independent exercise;Sedeno Term: Able to check pulse independently and accurately Short Term: Able to explain why pulse checking is important during independent exercise;Rabon Term: Able to check pulse independently and accurately    Understanding of Exercise Prescription Yes Yes Yes Yes    Intervention Provide education, explanation, and written materials on patient's individual exercise prescription Provide education, explanation, and written materials on patient's individual exercise prescription Provide education, explanation, and written materials on patient's individual exercise prescription Provide education, explanation, and written materials on patient's individual exercise prescription    Expected Outcomes Short Term: Able to explain program exercise prescription;Burzynski Term: Able to explain home exercise prescription to exercise independently Short Term: Able to explain program exercise prescription;Liming Term: Able to explain home exercise prescription to exercise independently Short Term: Able to explain program exercise prescription;Laris Term: Able to explain home exercise prescription to exercise independently Short Term: Able to explain program exercise prescription;Rogus Term: Able to explain home exercise prescription to exercise independently             Exercise Goals Re-Evaluation:  Exercise Goals Re-Evaluation     Row Name 09/18/22 1500 10/16/22 1500 11/13/22 1504         Exercise Goal Re-Evaluation   Exercise Goals Review Increase Physical Activity;Able to understand and use rate of perceived exertion (RPE) scale;Increase Strength and Stamina;Knowledge and understanding of Target Heart Rate Range (THRR);Able to check pulse independently;Understanding of Exercise Prescription Increase  Physical Activity;Increase Strength and Stamina;Able to understand and use rate of perceived exertion (RPE) scale;Knowledge and understanding of Target Heart Rate Range (THRR);Able to check pulse independently;Understanding of Exercise Prescription Increase Physical Activity;Increase Strength and Stamina;Able to understand and use rate of perceived exertion (RPE) scale;Knowledge and understanding of Target Heart Rate Range (THRR);Able to check pulse independently;Understanding of Exercise Prescription     Comments Pt has completed 7 sessions of CR. He enjoys coming to class and talking to other patients. He is progressing and increasing his workloads each class. He is getting stronger and we plan to have him walk on the treadmill in the future sessions. He is currently exercising at 2.47 METs on the stepper. Will continue to monitor and progress as able. Pt has completed 13 sessions of cardiac rehab. He enjoys coming to class and is progessing. He is able to walk on the treadmill now and is walking in with a cane instead of his rollator. He is increasing his workload on the stepper as well. He is currently exercising at 2.66 METs on the stepepr. Will continue to monitor and progress as able. Pt has completed 21 sessions of cardiac rehab. He continues to enjoy coming to class and is progressing his workloads. He is steady when walking on teh treadmill and increasing his workload on the stepper. He is currently exercising at 2.76 METs on the stepper. Will continue to monitor and progress as able.     Expected Outcomes Through exercise at rehab and home, the patient will meet thier stated goals Through exercise at rehab and home, the patient will meet thier stated goals Through exercise at rehab and home, the patient will meet thier stated goals              Nutrition & Weight - Outcomes:  Pre Biometrics - 08/28/22 1533       Pre Biometrics   Height 5\' 11"  (1.803 m)    Weight 190 lb 0.6 oz (86.2 kg)     Waist Circumference 38 inches    Hip Circumference 40  inches    Waist to Hip Ratio 0.95 %    BMI (Calculated) 26.52    Triceps Skinfold 5 mm    % Body Fat 21.2 %    Grip Strength 46.6 kg    Flexibility 0 in    Single Leg Stand 0 seconds              Nutrition:  Nutrition Therapy & Goals - 09/11/22 1030       Personal Nutrition Goals   Comments Patient's diet assessment score was 63. We offer 2 educational sessions on heart healthy nutrition with handouts and assistance with RD referral if patient is interested.      Intervention Plan   Intervention Nutrition handout(s) given to patient.    Expected Outcomes Short Term Goal: Understand basic principles of dietary content, such as calories, fat, sodium, cholesterol and nutrients.             Nutrition Discharge:  Nutrition Assessments - 08/28/22 1341       MEDFICTS Scores   Pre Score 63             Education Questionnaire Score:  Knowledge Questionnaire Score - 08/28/22 1342       Knowledge Questionnaire Score   Pre Score 21/24             Pt discharged from CR after 22 sessions. He did not attend his last two weeks in the program, and he did not return for his discharge walk test.

## 2022-12-01 ENCOUNTER — Other Ambulatory Visit (HOSPITAL_COMMUNITY): Payer: Self-pay

## 2022-12-01 DIAGNOSIS — Z7901 Long term (current) use of anticoagulants: Secondary | ICD-10-CM

## 2022-12-01 DIAGNOSIS — Z95811 Presence of heart assist device: Secondary | ICD-10-CM

## 2022-12-04 ENCOUNTER — Ambulatory Visit (HOSPITAL_COMMUNITY)
Admission: RE | Admit: 2022-12-04 | Discharge: 2022-12-04 | Disposition: A | Discharge status: Home / Self Care | Payer: 59 | Source: Ambulatory Visit | Attending: Internal Medicine | Admitting: Internal Medicine

## 2022-12-04 ENCOUNTER — Ambulatory Visit (HOSPITAL_COMMUNITY): Payer: Self-pay | Admitting: Pharmacist

## 2022-12-04 DIAGNOSIS — Z7901 Long term (current) use of anticoagulants: Secondary | ICD-10-CM | POA: Insufficient documentation

## 2022-12-04 DIAGNOSIS — Z95811 Presence of heart assist device: Secondary | ICD-10-CM | POA: Insufficient documentation

## 2022-12-04 LAB — PROTIME-INR
INR: 2.1 — ABNORMAL HIGH (ref 0.8–1.2)
Prothrombin Time: 22.9 seconds — ABNORMAL HIGH (ref 11.4–15.2)

## 2022-12-12 ENCOUNTER — Other Ambulatory Visit (HOSPITAL_COMMUNITY): Payer: Self-pay

## 2022-12-12 ENCOUNTER — Telehealth (HOSPITAL_COMMUNITY): Payer: Self-pay | Admitting: Unknown Physician Specialty

## 2022-12-12 DIAGNOSIS — Z95811 Presence of heart assist device: Secondary | ICD-10-CM

## 2022-12-12 DIAGNOSIS — Z7901 Long term (current) use of anticoagulants: Secondary | ICD-10-CM

## 2022-12-12 NOTE — Telephone Encounter (Signed)
Pt paged the VAD pager stating that his left kidney hurts. Pt states "I think it is shutting down." Pt denies blood in his urine. Pt tells me that he is making plenty of urine and does not have trouble urinating. VAD coordinator ask pt if he has ever had a kidney stone and he denies. D/w Dr Shirlee Latch, will have pt come to clinic tomorrow for BMET and U/A. Pt verbalized understanding.  Carlton Adam RN, BSN VAD Coordinator 24/7 Pager [phone]

## 2022-12-13 ENCOUNTER — Ambulatory Visit (HOSPITAL_COMMUNITY)
Admission: RE | Admit: 2022-12-13 | Discharge: 2022-12-13 | Disposition: A | Discharge status: Home / Self Care | Payer: 59 | Source: Ambulatory Visit | Attending: Cardiology | Admitting: Cardiology

## 2022-12-13 ENCOUNTER — Telehealth (HOSPITAL_COMMUNITY): Payer: Self-pay | Admitting: Unknown Physician Specialty

## 2022-12-13 DIAGNOSIS — Z95811 Presence of heart assist device: Secondary | ICD-10-CM | POA: Insufficient documentation

## 2022-12-13 DIAGNOSIS — Z7901 Long term (current) use of anticoagulants: Secondary | ICD-10-CM | POA: Insufficient documentation

## 2022-12-13 LAB — BASIC METABOLIC PANEL
Anion gap: 12 (ref 5–15)
BUN: 33 mg/dL — ABNORMAL HIGH (ref 6–20)
CO2: 26 mmol/L (ref 22–32)
Calcium: 9.2 mg/dL (ref 8.9–10.3)
Chloride: 91 mmol/L — ABNORMAL LOW (ref 98–111)
Creatinine, Ser: 1.4 mg/dL — ABNORMAL HIGH (ref 0.61–1.24)
GFR, Estimated: 58 mL/min — ABNORMAL LOW (ref >60)
Glucose, Bld: 209 mg/dL — ABNORMAL HIGH (ref 70–99)
Potassium: 4 mmol/L (ref 3.5–5.1)
Sodium: 129 mmol/L — ABNORMAL LOW (ref 135–145)

## 2022-12-13 LAB — URINALYSIS, ROUTINE W REFLEX MICROSCOPIC
Bacteria, UA: NONE SEEN
Bilirubin Urine: NEGATIVE
Glucose, UA: >=500 mg/dL — AB
Hgb urine dipstick: NEGATIVE
Ketones, ur: NEGATIVE mg/dL
Leukocytes,Ua: NEGATIVE
Nitrite: NEGATIVE
Protein, ur: NEGATIVE mg/dL
Specific Gravity, Urine: 1.016 (ref 1.005–1.030)
pH: 5 (ref 5.0–8.0)

## 2022-12-13 NOTE — Telephone Encounter (Signed)
Called pt to inform him of lab results from today. Pt tells me that his pain in his left kidney is the same as yesterday. He has not taken anything for this pain. Pt was informed that he can take Tylenol. D/w Dr Shirlee Latch lab results, pt was informed to hold Torsemide tomorrow. Pt informed of normal u/a. Pt instructed to call the clinic if his pain persist.  Carlton Adam RN, BSN VAD Coordinator 24/7 Pager [phone]

## 2022-12-14 ENCOUNTER — Other Ambulatory Visit (HOSPITAL_COMMUNITY): Payer: Self-pay | Admitting: Unknown Physician Specialty

## 2022-12-14 DIAGNOSIS — Z95811 Presence of heart assist device: Secondary | ICD-10-CM

## 2022-12-14 DIAGNOSIS — Z7901 Long term (current) use of anticoagulants: Secondary | ICD-10-CM

## 2022-12-18 ENCOUNTER — Ambulatory Visit (HOSPITAL_COMMUNITY): Payer: Self-pay | Admitting: Pharmacist

## 2022-12-18 ENCOUNTER — Ambulatory Visit (HOSPITAL_COMMUNITY)
Admission: RE | Admit: 2022-12-18 | Discharge: 2022-12-18 | Disposition: A | Discharge status: Home / Self Care | Payer: 59 | Source: Ambulatory Visit | Attending: Cardiology | Admitting: Cardiology

## 2022-12-18 DIAGNOSIS — Z7901 Long term (current) use of anticoagulants: Secondary | ICD-10-CM

## 2022-12-18 DIAGNOSIS — Z95811 Presence of heart assist device: Secondary | ICD-10-CM | POA: Diagnosis present

## 2022-12-18 LAB — PROTIME-INR
INR: 2.3 — ABNORMAL HIGH (ref 0.8–1.2)
Prothrombin Time: 25.4 seconds — ABNORMAL HIGH (ref 11.4–15.2)

## 2022-12-18 NOTE — Progress Notes (Signed)
Remote ICD transmission.   

## 2022-12-22 ENCOUNTER — Other Ambulatory Visit: Payer: Self-pay | Admitting: Physical Medicine and Rehabilitation

## 2022-12-29 ENCOUNTER — Other Ambulatory Visit (HOSPITAL_COMMUNITY): Payer: Self-pay | Admitting: *Deleted

## 2022-12-29 DIAGNOSIS — Z7901 Long term (current) use of anticoagulants: Secondary | ICD-10-CM

## 2022-12-29 DIAGNOSIS — Z95811 Presence of heart assist device: Secondary | ICD-10-CM

## 2023-01-01 ENCOUNTER — Ambulatory Visit (HOSPITAL_COMMUNITY): Payer: Self-pay | Admitting: Pharmacist

## 2023-01-01 ENCOUNTER — Ambulatory Visit (HOSPITAL_COMMUNITY)
Admission: RE | Admit: 2023-01-01 | Discharge: 2023-01-01 | Disposition: A | Discharge status: Home / Self Care | Payer: 59 | Source: Ambulatory Visit | Attending: Cardiology | Admitting: Cardiology

## 2023-01-01 ENCOUNTER — Encounter (HOSPITAL_COMMUNITY): Payer: Self-pay | Admitting: Cardiology

## 2023-01-01 DIAGNOSIS — J449 Chronic obstructive pulmonary disease, unspecified: Secondary | ICD-10-CM | POA: Insufficient documentation

## 2023-01-01 DIAGNOSIS — I4892 Unspecified atrial flutter: Secondary | ICD-10-CM | POA: Insufficient documentation

## 2023-01-01 DIAGNOSIS — Z79899 Other long term (current) drug therapy: Secondary | ICD-10-CM | POA: Diagnosis not present

## 2023-01-01 DIAGNOSIS — Z7901 Long term (current) use of anticoagulants: Secondary | ICD-10-CM

## 2023-01-01 DIAGNOSIS — I5022 Chronic systolic (congestive) heart failure: Secondary | ICD-10-CM | POA: Insufficient documentation

## 2023-01-01 DIAGNOSIS — Z7984 Long term (current) use of oral hypoglycemic drugs: Secondary | ICD-10-CM | POA: Diagnosis not present

## 2023-01-01 DIAGNOSIS — Z79890 Hormone replacement therapy (postmenopausal): Secondary | ICD-10-CM | POA: Insufficient documentation

## 2023-01-01 DIAGNOSIS — Z95811 Presence of heart assist device: Secondary | ICD-10-CM | POA: Diagnosis present

## 2023-01-01 DIAGNOSIS — I509 Heart failure, unspecified: Secondary | ICD-10-CM

## 2023-01-01 DIAGNOSIS — Z87891 Personal history of nicotine dependence: Secondary | ICD-10-CM | POA: Insufficient documentation

## 2023-01-01 DIAGNOSIS — I4891 Unspecified atrial fibrillation: Secondary | ICD-10-CM | POA: Diagnosis not present

## 2023-01-01 DIAGNOSIS — Z794 Long term (current) use of insulin: Secondary | ICD-10-CM | POA: Insufficient documentation

## 2023-01-01 DIAGNOSIS — E039 Hypothyroidism, unspecified: Secondary | ICD-10-CM | POA: Diagnosis not present

## 2023-01-01 DIAGNOSIS — I428 Other cardiomyopathies: Secondary | ICD-10-CM | POA: Insufficient documentation

## 2023-01-01 DIAGNOSIS — N183 Chronic kidney disease, stage 3 unspecified: Secondary | ICD-10-CM | POA: Insufficient documentation

## 2023-01-01 DIAGNOSIS — E1122 Type 2 diabetes mellitus with diabetic chronic kidney disease: Secondary | ICD-10-CM | POA: Diagnosis not present

## 2023-01-01 DIAGNOSIS — Z86718 Personal history of other venous thrombosis and embolism: Secondary | ICD-10-CM | POA: Diagnosis not present

## 2023-01-01 DIAGNOSIS — I272 Pulmonary hypertension, unspecified: Secondary | ICD-10-CM | POA: Insufficient documentation

## 2023-01-01 DIAGNOSIS — R42 Dizziness and giddiness: Secondary | ICD-10-CM | POA: Insufficient documentation

## 2023-01-01 DIAGNOSIS — Z4502 Encounter for adjustment and management of automatic implantable cardiac defibrillator: Secondary | ICD-10-CM | POA: Diagnosis not present

## 2023-01-01 DIAGNOSIS — D649 Anemia, unspecified: Secondary | ICD-10-CM | POA: Insufficient documentation

## 2023-01-01 DIAGNOSIS — Z8249 Family history of ischemic heart disease and other diseases of the circulatory system: Secondary | ICD-10-CM | POA: Diagnosis not present

## 2023-01-01 LAB — COMPREHENSIVE METABOLIC PANEL
ALT: 25 U/L (ref 0–44)
AST: 21 U/L (ref 15–41)
Albumin: 4 g/dL (ref 3.5–5.0)
Alkaline Phosphatase: 163 U/L — ABNORMAL HIGH (ref 38–126)
Anion gap: 12 (ref 5–15)
BUN: 29 mg/dL — ABNORMAL HIGH (ref 6–20)
CO2: 25 mmol/L (ref 22–32)
Calcium: 9.4 mg/dL (ref 8.9–10.3)
Chloride: 93 mmol/L — ABNORMAL LOW (ref 98–111)
Creatinine, Ser: 1.39 mg/dL — ABNORMAL HIGH (ref 0.61–1.24)
GFR, Estimated: 58 mL/min — ABNORMAL LOW (ref >60)
Glucose, Bld: 361 mg/dL — ABNORMAL HIGH (ref 70–99)
Potassium: 4.1 mmol/L (ref 3.5–5.1)
Sodium: 130 mmol/L — ABNORMAL LOW (ref 135–145)
Total Bilirubin: 0.8 mg/dL (ref 0.3–1.2)
Total Protein: 8.7 g/dL — ABNORMAL HIGH (ref 6.5–8.1)

## 2023-01-01 LAB — PROTIME-INR
INR: 2.4 — ABNORMAL HIGH (ref 0.8–1.2)
Prothrombin Time: 26.7 seconds — ABNORMAL HIGH (ref 11.4–15.2)

## 2023-01-01 LAB — CBC
HCT: 41.4 % (ref 39.0–52.0)
Hemoglobin: 11.8 g/dL — ABNORMAL LOW (ref 13.0–17.0)
MCH: 19.4 pg — ABNORMAL LOW (ref 26.0–34.0)
MCHC: 28.5 g/dL — ABNORMAL LOW (ref 30.0–36.0)
MCV: 68 fL — ABNORMAL LOW (ref 80.0–100.0)
Platelets: 414 10*3/uL — ABNORMAL HIGH (ref 150–400)
RBC: 6.09 MIL/uL — ABNORMAL HIGH (ref 4.22–5.81)
RDW: 23.9 % — ABNORMAL HIGH (ref 11.5–15.5)
WBC: 7.4 10*3/uL (ref 4.0–10.5)
nRBC: 0 % (ref 0.0–0.2)

## 2023-01-01 LAB — LACTATE DEHYDROGENASE: LDH: 216 U/L — ABNORMAL HIGH (ref 98–192)

## 2023-01-01 LAB — DIGOXIN LEVEL: Digoxin Level: 0.4 ng/mL — ABNORMAL LOW (ref 0.8–2.0)

## 2023-01-01 LAB — MAGNESIUM: Magnesium: 2.3 mg/dL (ref 1.7–2.4)

## 2023-01-01 LAB — PREALBUMIN: Prealbumin: 24 mg/dL (ref 18–38)

## 2023-01-01 MED ORDER — TORSEMIDE 20 MG PO TABS
ORAL_TABLET | ORAL | 3 refills | Status: DC
Start: 2023-01-01 — End: 2023-03-12

## 2023-01-01 MED ORDER — TORSEMIDE 20 MG PO TABS
ORAL_TABLET | ORAL | 3 refills | Status: DC
Start: 2023-01-01 — End: 2023-01-01

## 2023-01-01 NOTE — Progress Notes (Addendum)
Pt presents for 1 month f/u with 6 month [place] in VAD clinic today alone. Denies issues with VAD equipment or drive line.   Pt walked into clinic independently. States he is overall feeling well. Denies lightheadedness, falls, shortness of breath, and signs of bleeding. He complains of recurrent dizziness when getting out of the car. Orthostatics performed today. Pt also states he has continued "kidney pain" that he has been taking Tylenol for.   Pt did not bring medications today for VAD coordinator review. Reports he is not missing medications, and taking everything as prescribed except Torsemide. Pt states he is taking 40mg  of Torsemide at night and additional as needed. Weight down 1 lbs today. Dr. Shirlee Latch made aware. Plan to change regimen to 60mg  daily per MD and get a BMET in 10 days. Advised to notify VAD coordinators immediately if he experiences increased shortness of breath, fluid retention, or feeling poorly. He verbalized understanding.   Drive line dressing changed today. See documentation below. Advanced to weekly dressing. Pt may NOT shower as drive line is not incorporated, nor does he have shower bag. Instructed to notify VAD coordinators if redness, increased drainage, tenderness, or foul odor occurs. He verbalized understanding.   Vital Signs:  Doppler: 80 Automatic BP: 94/67 (80) HR: 80 NSR SpO2: 99 Orthostatic VS for the past 72 hrs (Last 3 readings):  Orthostatic BP Patient Position BP Location Orthostatic Pulse  01/01/23 1426 118/63 Standing Right Arm 84  01/01/23 1425 110/70 Sitting Right Arm 82  01/01/23 1045 92/69 Supine Right Arm 83    Destination Therapy due to uncontrolled diabetes   LVAD assessment: HM III: VAD Speed: 5400 rpms Flow: 4.8 Power: 4.1w    PI: 3.3 Hct: 30   Alarms: none Events: 0-5  Fixed speed: 5500 Low speed limit: 5200  Primary controller: back up battery due for replacement in  21 months Secondary controller:  back up battery due  for replacement in 23 months  I reviewed the LVAD parameters from today and compared the results to the patient's prior recorded data. LVAD interrogation was NEGATIVE for significant power changes, NEGATIVE for clinical alarms and STABLE for PI events/speed drops. No programming changes were made and pump is functioning within specified parameters. Pt is performing daily controller and system monitor self tests along with completing weekly and monthly maintenance for LVAD equipment.   LVAD equipment check completed and is in good working order. Back-up equipment not present today. Charged back up battery and performed self-test on equipment.    Annual Equipment Maintenance on UBC/PM was performed on 06/2022.  Patient completed 1000 feet during 6 minute walk test. Tolerated well.   Neurocognitive trail making completed correctly in 2 m 37 s.   [address] Cardiomyopathy, [name]   [place] Cardiomyopathy Questionnaire     01/01/2023    2:59 PM 10/10/2022    1:17 PM  [name]  1 a. Ability to shower/bathe Other, Did not do Not at all limited  1 b. Ability to walk 1 block Slightly limited Slightly limited  1 c. Ability to hurry/jog Extremely limited Extremely limited  2. Edema feet/ankles/legs Less than once a week Less than once a week  3. Limited by fatigue Less than once a week 1-2 times a week  4. Limited by dyspnea Less than once a week 1-2 times a week  5. Sitting up / on 3+ pillows Never over the past 2 weeks Never over the past  2 weeks  6. Limited enjoyment of life Slightly limited Not limited at all  7. Rest of life w/ symptoms Completely satisfied Completely satisfied  8 a. Participation in hobbies Limited quite a bit Slightly limited  8 b. Participation in chores Severely limited Slightly limited  8 c. Visiting family/friends N/A, did not do for other reasons Did not limit at all     Exit Site Care: Existing VAD  dressing removed and site care performed using sterile technique. Drive line exit site cleaned with Chlora prep applicators x 2, allowed to dry, and Sorbaview dressing with Silverlon patch applied. Exit site healed and incorporated, the velour is fully implanted at exit site. No redness, tenderness, drainage, foul odor or rash noted. Mild scab noted at exit site. Drive line anchor re-applied. Pt denies fever or chills. Pt given 8 weekly kits for home use.     Significant Events on VAD Support:  none   Device: N/A    BP & Labs:  Doppler 80 - Doppler is reflecting MAP   Hgb 11.8 - No S/S of bleeding. Specifically denies melena/BRBPR or nosebleeds.   LDH stable at 216 - Denies tea-colored urine. No power elevations noted on interrogation.   Patient Instructions: Change Torsemide to 60mg  daily (3 pills) Advanced to weekly drive line dressing changes. 4 weekly kits provided to patient at today's appointment. Coumadin dosing per Lauren PharmD Return to clinic in 1 month  Simmie Davies RN,BSN VAD Coordinator  Office: [phone]  24/7 Pager: [phone]     ADVANCED HF/VAD CLINIC NOTE   Darryl Frye is a 62 y.o. with history of atrial fibrillation/flutter and nonischemic cardiomyopathy who was initially referred by Dr. Wyline Mood for evaluation of CHF.  Patient was diagnosed with CHF in 2020 at [facility] in [place].  At the time, he drank heavily and used cocaine.  He stopped drinking, smoking, and cocaine in 2020 and has not used any of these substances since then. Echo in 11/20 showed EF < 10%, cath in 12/20 showed normal coronaries.  Echo has shown persistently low EF over time, echo in 2/23 showed EF 20-25%.  He has a Medtronic ICD.      Patient was admitted in 2/23 with atrial flutter/RVR. He was started on amiodarone and converted to NSR.  He had atrial flutter ablation in 5/23 and amiodarone was stopped  Echo was done in 11/23 showing elevated filling pressure and markedly low cardiac  output.  Patient was admitted, started on milrinone, and underwent HM3 LVAD placement with LAA clipping later in 11/23.  Post-op course complicated by acute hypoxemic respiratory failure post-op and PNA.  Patient went to CIR after acute hospital course.   Ramp echo was in 4/24, at 5500 bpm, the IV septum was shifted mildly to the left with moderate RV dilation and dysfunction, AoV opened rarely.  Speed decreased to 5400 bpm, slight opening AoV every beat, trivial AI, midline IV septum.  Speed left at 5400 bpm.   Returns for LVAD followup. Weight down 1 lb. Occasionally notes lightheadedness when he gets out of his car, otherwise no orthostatic symptoms.  Only taking torsemide 40 mg daily rather than 60 qam/40 qpm.  No peripheral edema, however.  He does not note dyspnea walking on flat ground.  Still gets tired relatively easily.  MAP 80.   Labs (12/23): K 4.3, creatinine 1.25 => 1.45, Na 128, hgb 8.8 => 9.6, LDH 211, LFTs normal, TSH normal, digoxin 0.8 Labs (1/24): K 3.9, creatinine 1.32, LDH 224,  hgb 10.4 Labs (2/24): K 4.5, creatinine 1.33 Labs (4/24): creatinine 1.16, hgb 10.9 => 11  PMH: 1. Atrial fibrillation/flutter: Mainly flutter.  - 5/23 Atrial flutter ablation.  2. H/o DVT 3. Chronic systolic CHF: Nonischemic cardiomyopathy.  Medtronic ICD.  - Echo (11/20): EF < 10% - Cath (12/20): Normal coronaries.  - Echo (8/22): EF 30%, mildly decreased RV systolic function.  - Echo (2/23): EF 20-25%, normal RV.  - RHC (11/23): mean RA 14, PA 66/53 mean 54, mean PCWP 43, CI 1.62 F/1.31 T - HM3 LVAD with LAA clipping on 07/04/22.  - Ramp echo (2/24): Speed increased to 5500 rpm.  At this speed, IV septum was midline.  Aortic valve opened 1/3 beats.  Mild RV enlargement with moderate RV dysfunction.  - Ramp echo (3/24): Speed decreased back to 5400 rpm as the IV septum was shifted left.  Midline septum at 5400 rpm.  4. Prior cocaine abuse.  5. Prior heavy ETOH 6. COPD: Moderate obstruction on  11/23 PFTs. No longer smokes.    SH: Patient was a heavy drinker, abused cocaine, and smoked in the past but quit in 2020.  Lives in [place]. He does part-time custodial work.    FH: Mother with PPM, aunt with CHF.    VAD Indication: Destination Therapy due to uncontrolled diabetes   LVAD assessment: HM III: See LVAD nurse's note above.    I reviewed the LVAD parameters from today and compared the results to the patient's prior recorded data. LVAD interrogation was NEGATIVE for significant power changes, NEGATIVE for clinical alarms and STABLE for PI events/speed drops. No programming changes were made and pump is functioning within specified parameters. Pt is performing daily controller and system monitor self tests along with completing weekly and monthly maintenance for LVAD equipment.   LVAD equipment check completed and is in good working order. Back-up equipment present. Charged back up battery and performed self-test on equipment.    Annual Equipment Maintenance on UBC/PM was performed on 06/2022.      ROS: All systems negative except as listed in HPI, PMH and Problem List.  Current Outpatient Medications  Medication Sig Dispense Refill   acetaminophen (TYLENOL) 325 MG tablet Take 1-2 tablets (325-650 mg total) by mouth every 4 (four) hours as needed for mild pain.     atorvastatin (LIPITOR) 20 MG tablet Take 1 tablet (20 mg total) by mouth daily. 90 tablet 3   Calcium Carb-Cholecalciferol 600-10 MG-MCG TABS Take 1 tablet by mouth daily with breakfast. 90 tablet 3   Continuous Blood Gluc Receiver (FREESTYLE LIBRE 3 READER) DEVI 1 each by Does not apply route daily. 2 each 3   Continuous Glucose Sensor (FREESTYLE LIBRE 3 SENSOR) MISC PLACE 1 SENSOR ON THE SKIN EVERY 14 DAYS. USE TO CHECK GLUCOSE CONTINUOUSLY. 2 each 3   digoxin (LANOXIN) 0.125 MG tablet Take 0.5 tablets (0.0625 mg total) by mouth daily. 30 tablet 5   empagliflozin (JARDIANCE) 10 MG TABS tablet Take 1 tablet (10  mg total) by mouth daily. 30 tablet 6   insulin glargine (SEMGLEE, YFGN,) 100 UNIT/ML Solostar Pen Inject 22 Units into the skin at bedtime. 15 mL 0   insulin glargine-yfgn (SEMGLEE) 100 UNIT/ML injection Inject 0.13 mLs (13 Units total) into the skin daily. 10 mL 11   insulin lispro (HUMALOG KWIKPEN) 100 UNIT/ML KwikPen Use 4 units three times a day with meals (Patient taking differently: 10 Units 3 (three) times daily. Use 10 units three times a day with meals)  15 mL 0   Insulin Pen Needle (PEN NEEDLES) 32G X 4 MM MISC Use with insulin pen with breakfast, with lunch, and with evening meal. 100 each 0   levothyroxine (SYNTHROID) 25 MCG tablet Take 1 tablet (25 mcg total) by mouth daily before breakfast. 90 tablet 3   magnesium oxide (MAG-OX) 400 (240 Mg) MG tablet Take 1 tablet (400 mg total) by mouth 2 (two) times daily. 180 tablet 3   Multiple Vitamin (MULTIVITAMIN WITH MINERALS) TABS tablet Take 1 tablet by mouth daily.     pantoprazole (PROTONIX) 40 MG tablet Take 1 tablet (40 mg total) by mouth daily. 90 tablet 3   potassium chloride SA (KLOR-CON M) 20 MEQ tablet Take 2 tablets (40 mEq total) by mouth daily. 180 tablet 3   sertraline (ZOLOFT) 50 MG tablet Take 1 tablet (50 mg total) by mouth daily. 90 tablet 3   sildenafil (REVATIO) 20 MG tablet Take 1 tablet (20 mg total) by mouth 3 (three) times daily. 90 tablet 6   spironolactone (ALDACTONE) 25 MG tablet Take 1 tablet (25 mg total) by mouth daily. 90 tablet 3   tiotropium (SPIRIVA HANDIHALER) 18 MCG inhalation capsule Place 1 capsule (18 mcg total) into inhaler and inhale daily. 30 capsule 12   warfarin (COUMADIN) 3 MG tablet Take 6 mg (2 tablets) every Tuesday/Thursday/Saturday and 3 mg (1 tablet) all other days or as directed by heart failure clinic 150 tablet 3   enoxaparin (LOVENOX) 40 MG/0.4ML injection Inject 0.4 mLs (40 mg total) into the skin every 12 (twelve) hours. (Patient not taking: Reported on 10/10/2022) 4 mL 0   Fe Fum-Vit  C-Vit B12-FA (TRIGELS-F FORTE) CAPS capsule Take 1 capsule by mouth daily after breakfast. (Patient not taking: Reported on 10/10/2022) 30 capsule 6   torsemide (DEMADEX) 20 MG tablet Take 3 tablets (60 mg) daily 180 tablet 3   No current facility-administered medications for this encounter.    Patient has no known allergies.  REVIEW OF SYSTEMS: All systems negative except as listed in HPI, PMH and Problem list.   LVAD INTERROGATION:  See LVAD nurse's note above.   I reviewed the LVAD parameters from today, and compared the results to the patient's prior recorded data.  No programming changes were made.  The LVAD is functioning within specified parameters.  The patient performs LVAD self-test daily.  LVAD interrogation was negative for any significant power changes, alarms or PI events/speed drops.  LVAD equipment check completed and is in good working order.  Back-up equipment present.   LVAD education done on emergency procedures and precautions and reviewed exit site care.    Vitals:   01/01/23 1025 01/01/23 1041  BP: 94/67 (!) 80/0  Pulse: 80   SpO2: 99%   Weight: 91.4 kg (201 lb 6.4 oz)    Vital Signs:  BP (!) 80/0   Pulse 80   Wt 91.4 kg (201 lb 6.4 oz)   SpO2 99%   BMI 28.09 kg/m  MAP 80 Physical Exam: General: Well appearing this am. NAD.  HEENT: Normal. Neck: Supple, JVP 8-9 cm. Carotids OK.  Cardiac:  Mechanical heart sounds with LVAD hum present.  Lungs:  CTAB, normal effort.  Abdomen:  NT, ND, no HSM. No bruits or masses. +BS  LVAD exit site: Well-healed and incorporated. Dressing dry and intact. No erythema or drainage. Stabilization device present and accurately applied. Driveline dressing changed daily per sterile technique. Extremities:  Warm and dry. No cyanosis, clubbing, rash, or edema.  Neuro:  Alert & oriented x 3. Cranial nerves grossly intact. Moves all 4 extremities w/o difficulty. Affect pleasant    ASSESSMENT AND PLAN:   1. Chronic systolic CHF:  Nonischemic cardiomyopathy, diagnosed 2020.  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  Cath in 12/20 with no significant coronary disease.  Medtronic ICD. Echo in 2/23 showed EF 20-25% with normal RV.  RHC in 11/13 showed markedly elevated filling pressures, primarily pulmonary venous hypertension, low cardiac output, and low PAPI. Echo in 11/23 with EF <20%, mod RV dysfunction.  He was admitted and started on milrinone with improvement in hemodynamics.  We proceeded with HM3 LVAD with LAA clip on 07/04/22.  NYHA class II, mild volume overload on exam.  He cut back his torsemide to 40 mg daily on his own.  - Increase torsemide to 60 mg daily.  BMET today and in 10 days.  - Continue Jardiance 10 mg daily.  - Continue sildenafil 20 tid for RV.  - Continue spironolactone 25 mg daily.  - Continue digoxin 0.0625 daily, check level today.  - Lightheadedness when gets out of his car, will check orthostatics today.  2. Atrial fibrillation/atrial flutter: H/o flutter ablation.  Had AF/RVR post-LVAD, went back into NSR.  He is now off amiodarone.   - Continue warfarin.  3. VAD management: s/p HM-3 VAD on 07/04/22. Ramp echo in 4/24, speed decreased to 5400 rpm.  Stable LVAD parameters.  - Now off ASA.  - Continue warfarin, INR goal 2-2.5. 4. H/o DVT: On anticoagulation.  5. CKD stage 3: On Jardiance.  - BMET today.  6. Hypothyroidism - On levothyroxine.  7. Anemia: post-op.  - CBC today.  8. COPD: Moderate on 11/23 PFTs.  Suspect this plays a role in his dyspnea.  - Continue Spiriva.  - Referred to pulmonary in [place].   Followup in 2 months.   Marca Ancona 01/01/2023

## 2023-01-01 NOTE — Patient Instructions (Signed)
Increase Torsemide to 60 mg (3 tablets) daily. If you have increased shortness of breath, feel like you are holding on to fluid, or feel poorly notify VAD coordinators immediately.  Advanced to weekly drive line dressing changes. 4 weekly kits provided to patient at today's appointment. Please go to [facility] for a BMET in 10 days. Coumadin dosing per Lauren PharmD Return to clinic in 2 month

## 2023-01-01 NOTE — Progress Notes (Signed)
CSW met with patient in the clinic. Patient states he is doing well and is doing his own dressing. CSW shared about the LVAD Support Group and provided information on next meeting. Patient states interest and will plan to attend and denies any other concerns at this time. CSW continues to follow as needs are identified. Lasandra Beech, LCSW, CCSW-MCS [phone]

## 2023-01-04 ENCOUNTER — Other Ambulatory Visit (HOSPITAL_COMMUNITY): Payer: Self-pay | Admitting: Unknown Physician Specialty

## 2023-01-04 DIAGNOSIS — Z7901 Long term (current) use of anticoagulants: Secondary | ICD-10-CM

## 2023-01-04 DIAGNOSIS — Z95811 Presence of heart assist device: Secondary | ICD-10-CM

## 2023-01-10 ENCOUNTER — Other Ambulatory Visit (HOSPITAL_COMMUNITY): Payer: Self-pay

## 2023-01-10 ENCOUNTER — Other Ambulatory Visit (HOSPITAL_COMMUNITY)
Admission: RE | Admit: 2023-01-10 | Discharge: 2023-01-10 | Disposition: A | Discharge status: Home / Self Care | Payer: 59 | Source: Ambulatory Visit | Attending: Cardiology | Admitting: Cardiology

## 2023-01-10 ENCOUNTER — Ambulatory Visit (HOSPITAL_COMMUNITY): Payer: Self-pay | Admitting: Pharmacist

## 2023-01-10 ENCOUNTER — Other Ambulatory Visit: Payer: Self-pay

## 2023-01-10 DIAGNOSIS — Z7901 Long term (current) use of anticoagulants: Secondary | ICD-10-CM

## 2023-01-10 DIAGNOSIS — Z95811 Presence of heart assist device: Secondary | ICD-10-CM | POA: Insufficient documentation

## 2023-01-10 LAB — PROTIME-INR
INR: 2.2 — ABNORMAL HIGH (ref 0.8–1.2)
Prothrombin Time: 25.1 seconds — ABNORMAL HIGH (ref 11.4–15.2)

## 2023-01-10 NOTE — Progress Notes (Signed)
Pt was suppose to receive INR and BMET today at [facility] lab. Pt only received INR. New orders placed for BMET and pt called and instructed to return to [facility] today or tomorrow for additional blood work to be drawn.  Simmie Davies RN, BSN VAD Coordinator 24/7 Pager [phone]

## 2023-01-11 ENCOUNTER — Other Ambulatory Visit (HOSPITAL_COMMUNITY)
Admission: RE | Admit: 2023-01-11 | Discharge: 2023-01-11 | Disposition: A | Discharge status: Home / Self Care | Payer: 59 | Source: Ambulatory Visit | Attending: Cardiology | Admitting: Cardiology

## 2023-01-11 DIAGNOSIS — Z95811 Presence of heart assist device: Secondary | ICD-10-CM | POA: Insufficient documentation

## 2023-01-11 DIAGNOSIS — Z7901 Long term (current) use of anticoagulants: Secondary | ICD-10-CM | POA: Insufficient documentation

## 2023-01-11 LAB — PROTIME-INR
INR: 2.2 — ABNORMAL HIGH (ref 0.8–1.2)
Prothrombin Time: 24.8 seconds — ABNORMAL HIGH (ref 11.4–15.2)

## 2023-01-17 ENCOUNTER — Other Ambulatory Visit (HOSPITAL_COMMUNITY): Payer: Self-pay

## 2023-01-17 DIAGNOSIS — Z7901 Long term (current) use of anticoagulants: Secondary | ICD-10-CM

## 2023-01-17 DIAGNOSIS — Z95811 Presence of heart assist device: Secondary | ICD-10-CM

## 2023-01-22 ENCOUNTER — Other Ambulatory Visit (HOSPITAL_COMMUNITY): Payer: 59

## 2023-01-29 ENCOUNTER — Telehealth (HOSPITAL_COMMUNITY): Payer: Self-pay | Admitting: *Deleted

## 2023-01-29 NOTE — Telephone Encounter (Signed)
Called patient re: missed INR appointment at [facility] that was scheduled for 01/22/23. Pt says he "forgot". He will go to lab tomorrow for test. I informed him Karle Plumber, PharmD will contact him with results and warfarin dosing instructions. He verbalized understanding of same.

## 2023-01-30 ENCOUNTER — Other Ambulatory Visit (HOSPITAL_COMMUNITY)
Admission: RE | Admit: 2023-01-30 | Discharge: 2023-01-30 | Disposition: A | Discharge status: Home / Self Care | Payer: 59 | Source: Ambulatory Visit | Attending: Cardiology | Admitting: Cardiology

## 2023-01-30 ENCOUNTER — Ambulatory Visit (HOSPITAL_COMMUNITY): Payer: Self-pay | Admitting: Pharmacist

## 2023-01-30 DIAGNOSIS — Z95811 Presence of heart assist device: Secondary | ICD-10-CM | POA: Diagnosis present

## 2023-01-30 DIAGNOSIS — Z7901 Long term (current) use of anticoagulants: Secondary | ICD-10-CM | POA: Insufficient documentation

## 2023-01-30 LAB — BASIC METABOLIC PANEL
Anion gap: 13 (ref 5–15)
BUN: 34 mg/dL — ABNORMAL HIGH (ref 6–20)
CO2: 25 mmol/L (ref 22–32)
Calcium: 9.1 mg/dL (ref 8.9–10.3)
Chloride: 93 mmol/L — ABNORMAL LOW (ref 98–111)
Creatinine, Ser: 1.32 mg/dL — ABNORMAL HIGH (ref 0.61–1.24)
GFR, Estimated: >60 mL/min (ref >60)
Glucose, Bld: 223 mg/dL — ABNORMAL HIGH (ref 70–99)
Potassium: 3.8 mmol/L (ref 3.5–5.1)
Sodium: 131 mmol/L — ABNORMAL LOW (ref 135–145)

## 2023-01-30 LAB — PROTIME-INR
INR: 2.2 — ABNORMAL HIGH (ref 0.8–1.2)
Prothrombin Time: 24.7 seconds — ABNORMAL HIGH (ref 11.4–15.2)

## 2023-02-05 ENCOUNTER — Ambulatory Visit (INDEPENDENT_AMBULATORY_CARE_PROVIDER_SITE_OTHER): Payer: 59

## 2023-02-05 DIAGNOSIS — I509 Heart failure, unspecified: Secondary | ICD-10-CM | POA: Diagnosis not present

## 2023-02-06 LAB — CUP PACEART REMOTE DEVICE CHECK
Date Time Interrogation Session: 20240618140301
Implantable Lead Connection Status: 753985
Implantable Lead Implant Date: 20210929
Implantable Lead Location: 753860
Implantable Pulse Generator Implant Date: 20210929

## 2023-02-16 ENCOUNTER — Other Ambulatory Visit (HOSPITAL_COMMUNITY): Payer: Self-pay | Admitting: *Deleted

## 2023-02-16 DIAGNOSIS — Z7901 Long term (current) use of anticoagulants: Secondary | ICD-10-CM

## 2023-02-16 DIAGNOSIS — Z95811 Presence of heart assist device: Secondary | ICD-10-CM

## 2023-02-20 ENCOUNTER — Ambulatory Visit (HOSPITAL_COMMUNITY): Payer: Self-pay | Admitting: Pharmacist

## 2023-02-20 ENCOUNTER — Other Ambulatory Visit (HOSPITAL_COMMUNITY)
Admission: RE | Admit: 2023-02-20 | Discharge: 2023-02-20 | Disposition: A | Discharge status: Home / Self Care | Payer: 59 | Source: Ambulatory Visit | Attending: Cardiology | Admitting: Cardiology

## 2023-02-20 DIAGNOSIS — Z7901 Long term (current) use of anticoagulants: Secondary | ICD-10-CM | POA: Diagnosis present

## 2023-02-20 DIAGNOSIS — Z95811 Presence of heart assist device: Secondary | ICD-10-CM | POA: Insufficient documentation

## 2023-02-20 LAB — PROTIME-INR
INR: 2.3 — ABNORMAL HIGH (ref 0.8–1.2)
Prothrombin Time: 25.8 seconds — ABNORMAL HIGH (ref 11.4–15.2)

## 2023-02-26 NOTE — Progress Notes (Signed)
Remote ICD transmission.   

## 2023-03-09 ENCOUNTER — Other Ambulatory Visit (HOSPITAL_COMMUNITY): Payer: Self-pay | Admitting: *Deleted

## 2023-03-09 DIAGNOSIS — Z95811 Presence of heart assist device: Secondary | ICD-10-CM

## 2023-03-09 DIAGNOSIS — Z7901 Long term (current) use of anticoagulants: Secondary | ICD-10-CM

## 2023-03-12 ENCOUNTER — Encounter (HOSPITAL_COMMUNITY): Payer: Self-pay | Admitting: Cardiology

## 2023-03-12 ENCOUNTER — Ambulatory Visit (HOSPITAL_COMMUNITY)
Admission: RE | Admit: 2023-03-12 | Discharge: 2023-03-12 | Disposition: A | Discharge status: Home / Self Care | Payer: 59 | Source: Ambulatory Visit | Attending: Cardiology | Admitting: Cardiology

## 2023-03-12 ENCOUNTER — Ambulatory Visit (HOSPITAL_COMMUNITY): Payer: Self-pay | Admitting: Pharmacist

## 2023-03-12 DIAGNOSIS — J449 Chronic obstructive pulmonary disease, unspecified: Secondary | ICD-10-CM | POA: Diagnosis not present

## 2023-03-12 DIAGNOSIS — Z8249 Family history of ischemic heart disease and other diseases of the circulatory system: Secondary | ICD-10-CM | POA: Diagnosis not present

## 2023-03-12 DIAGNOSIS — E1122 Type 2 diabetes mellitus with diabetic chronic kidney disease: Secondary | ICD-10-CM | POA: Diagnosis not present

## 2023-03-12 DIAGNOSIS — E039 Hypothyroidism, unspecified: Secondary | ICD-10-CM | POA: Diagnosis not present

## 2023-03-12 DIAGNOSIS — Z79890 Hormone replacement therapy (postmenopausal): Secondary | ICD-10-CM | POA: Diagnosis not present

## 2023-03-12 DIAGNOSIS — Z86718 Personal history of other venous thrombosis and embolism: Secondary | ICD-10-CM | POA: Diagnosis not present

## 2023-03-12 DIAGNOSIS — Z452 Encounter for adjustment and management of vascular access device: Secondary | ICD-10-CM | POA: Insufficient documentation

## 2023-03-12 DIAGNOSIS — Z87891 Personal history of nicotine dependence: Secondary | ICD-10-CM | POA: Diagnosis not present

## 2023-03-12 DIAGNOSIS — I5022 Chronic systolic (congestive) heart failure: Secondary | ICD-10-CM | POA: Insufficient documentation

## 2023-03-12 DIAGNOSIS — D649 Anemia, unspecified: Secondary | ICD-10-CM | POA: Diagnosis not present

## 2023-03-12 DIAGNOSIS — Z7901 Long term (current) use of anticoagulants: Secondary | ICD-10-CM | POA: Diagnosis not present

## 2023-03-12 DIAGNOSIS — Z7984 Long term (current) use of oral hypoglycemic drugs: Secondary | ICD-10-CM | POA: Diagnosis not present

## 2023-03-12 DIAGNOSIS — I4892 Unspecified atrial flutter: Secondary | ICD-10-CM | POA: Diagnosis not present

## 2023-03-12 DIAGNOSIS — R42 Dizziness and giddiness: Secondary | ICD-10-CM | POA: Insufficient documentation

## 2023-03-12 DIAGNOSIS — R0609 Other forms of dyspnea: Secondary | ICD-10-CM | POA: Diagnosis not present

## 2023-03-12 DIAGNOSIS — Z79899 Other long term (current) drug therapy: Secondary | ICD-10-CM | POA: Diagnosis not present

## 2023-03-12 DIAGNOSIS — N183 Chronic kidney disease, stage 3 unspecified: Secondary | ICD-10-CM | POA: Diagnosis not present

## 2023-03-12 DIAGNOSIS — I4891 Unspecified atrial fibrillation: Secondary | ICD-10-CM | POA: Diagnosis not present

## 2023-03-12 DIAGNOSIS — Z95811 Presence of heart assist device: Secondary | ICD-10-CM | POA: Insufficient documentation

## 2023-03-12 DIAGNOSIS — I509 Heart failure, unspecified: Secondary | ICD-10-CM | POA: Diagnosis not present

## 2023-03-12 DIAGNOSIS — I428 Other cardiomyopathies: Secondary | ICD-10-CM | POA: Diagnosis not present

## 2023-03-12 DIAGNOSIS — I272 Pulmonary hypertension, unspecified: Secondary | ICD-10-CM | POA: Diagnosis not present

## 2023-03-12 LAB — BASIC METABOLIC PANEL
Anion gap: 13 (ref 5–15)
BUN: 34 mg/dL — ABNORMAL HIGH (ref 6–20)
CO2: 26 mmol/L (ref 22–32)
Calcium: 8.9 mg/dL (ref 8.9–10.3)
Chloride: 92 mmol/L — ABNORMAL LOW (ref 98–111)
Creatinine, Ser: 1.39 mg/dL — ABNORMAL HIGH (ref 0.61–1.24)
GFR, Estimated: 58 mL/min — ABNORMAL LOW (ref >60)
Glucose, Bld: 182 mg/dL — ABNORMAL HIGH (ref 70–99)
Potassium: 4 mmol/L (ref 3.5–5.1)
Sodium: 131 mmol/L — ABNORMAL LOW (ref 135–145)

## 2023-03-12 LAB — PROTIME-INR
INR: 2 — ABNORMAL HIGH (ref 0.8–1.2)
Prothrombin Time: 22.5 seconds — ABNORMAL HIGH (ref 11.4–15.2)

## 2023-03-12 LAB — CBC
HCT: 42.5 % (ref 39.0–52.0)
Hemoglobin: 12.3 g/dL — ABNORMAL LOW (ref 13.0–17.0)
MCH: 19.3 pg — ABNORMAL LOW (ref 26.0–34.0)
MCHC: 28.9 g/dL — ABNORMAL LOW (ref 30.0–36.0)
MCV: 66.8 fL — ABNORMAL LOW (ref 80.0–100.0)
Platelets: 404 10*3/uL — ABNORMAL HIGH (ref 150–400)
RBC: 6.36 MIL/uL — ABNORMAL HIGH (ref 4.22–5.81)
RDW: 23.5 % — ABNORMAL HIGH (ref 11.5–15.5)
WBC: 7.8 10*3/uL (ref 4.0–10.5)
nRBC: 0 % (ref 0.0–0.2)

## 2023-03-12 LAB — LACTATE DEHYDROGENASE: LDH: 219 U/L — ABNORMAL HIGH (ref 98–192)

## 2023-03-12 LAB — DIGOXIN LEVEL: Digoxin Level: 0.3 ng/mL — ABNORMAL LOW (ref 0.8–2.0)

## 2023-03-12 LAB — MAGNESIUM: Magnesium: 2 mg/dL (ref 1.7–2.4)

## 2023-03-12 MED ORDER — TORSEMIDE 20 MG PO TABS
ORAL_TABLET | ORAL | 6 refills | Status: DC
Start: 2023-03-12 — End: 2023-09-05

## 2023-03-12 NOTE — Progress Notes (Addendum)
Pt presents for 2 month f/u in VAD clinic today alone. Denies issues with VAD equipment or drive line.   Pt walked into clinic independently. States he is overall feeling well. Denies falls, shortness of breath, and signs of bleeding. Reports intermittent lightheadedness/dizziness, but this resolves with rest. He is unsure how much fluid he is drinking each day stating "I drink when I am thirsty." Encouraged 2 L daily intake. He verbalized understanding.   Pt did not bring medications today for VAD coordinator review. Reports he is not missing medications, and taking everything as prescribed except Torsemide. Pt states he is taking 40mg  of Torsemide in the morning, and 60 mg in the evening. (Prescribed 60 mg daily). States he increased his dose to twice a day a week ago due to leg swelling and shortness of breath when climbing stairs. States he feels much better on higher dose. Good urine output noted. Denies shortness of breath today. Weight is up 9 lbs today. Per Dr Shirlee Latch may continue to take Torsemide 40 mg in AM 60 mg in PM. Updated prescription sent to pt's pharmacy.Advised to notify VAD coordinators immediately if he experiences increased shortness of breath, fluid retention, or feeling poorly. He verbalized understanding.   Complaining of intermittent itching at exit site. Discussed he may rinse with saline wipe after cleansing with CHG to see if this helps relieve itching. He verbalized understanding. Drive line dressing changed today. See documentation below. Continue weekly/PRN Pt may NOT shower as drive line is not entirely incorporated (small opening noted underneath drive line tracking into tunnel), nor does he have shower bag. Instructed to notify VAD coordinators if redness, increased drainage, tenderness, or foul odor occurs. He verbalized understanding.   Vital Signs:  Doppler: 110 Automatic BP: 108/51 (83) HR: 88 NSR SpO2: 97% on RA  Destination Therapy due to uncontrolled diabetes    LVAD assessment: HM III: VAD Speed: 5400 rpms Flow: 4.7 Power: 4.2 w    PI: 3.0 Hct: 30   Alarms: none Events: 0-5  Fixed speed: 5500 Low speed limit: 5200  Primary controller: back up battery due for replacement in 19 months Secondary controller:  back up battery due for replacement in 23 months  I reviewed the LVAD parameters from today and compared the results to the patient's prior recorded data. LVAD interrogation was NEGATIVE for significant power changes, NEGATIVE for clinical alarms and STABLE for PI events/speed drops. No programming changes were made and pump is functioning within specified parameters. Pt is performing daily controller and system monitor self tests along with completing weekly and monthly maintenance for LVAD equipment.   LVAD equipment check completed and is in good working order. Back-up equipment present today. Charged back up battery and performed self-test on equipment.    Annual Equipment Maintenance on UBC/PM was performed on 06/2022.     Exit Site Care: Existing VAD dressing removed and site care performed using sterile technique. Drive line exit site cleaned with Chlora prep applicators x 2, rinsed with saline, allowed to dry, and Sorbaview dressing with Silverlon patch applied. Exit site healed and  mostly incorporated (slight opening noted underneath drive line, the velour is fully implanted at exit site. No redness, tenderness, drainage, foul odor or rash noted. Drive line anchor re-applied. Pt denies fever or chills. Pt given 8 weekly kits for home use.    Significant Events on VAD Support:  none   Device: N/A    BP & Labs:  Doppler 110 - Doppler is reflecting modified  systolic   Hgb pending - No S/S of bleeding. Specifically denies melena/BRBPR or nosebleeds.   LDH stable at pending - Denies tea-colored urine. No power elevations noted on interrogation.   Patient Instructions: May continue taking Torsemide 40 mg (2 tablets) in the  morning and 60 mg (3 tablets) in the evening Coumadin dosing per Lauren PharmD Return to VAD clinic in 2 months for follow up with Dr Sarina Ill RN VAD Coordinator  Office: [phone]  24/7 Pager: [phone]      ADVANCED HF/VAD CLINIC NOTE   Darryl Frye is a 62 y.o. with history of atrial fibrillation/flutter and nonischemic cardiomyopathy who was initially referred by Dr. Wyline Mood for evaluation of CHF.  Patient was diagnosed with CHF in 2020 at [facility] in [place].  At the time, he drank heavily and used cocaine.  He stopped drinking, smoking, and cocaine in 2020 and has not used any of these substances since then. Echo in 11/20 showed EF < 10%, cath in 12/20 showed normal coronaries.  Echo has shown persistently low EF over time, echo in 2/23 showed EF 20-25%.  He has a Medtronic ICD.      Patient was admitted in 2/23 with atrial flutter/RVR. He was started on amiodarone and converted to NSR.  He had atrial flutter ablation in 5/23 and amiodarone was stopped  Echo was done in 11/23 showing elevated filling pressure and markedly low cardiac output.  Patient was admitted, started on milrinone, and underwent HM3 LVAD placement with LAA clipping later in 11/23.  Post-op course complicated by acute hypoxemic respiratory failure post-op and PNA.  Patient went to CIR after acute hospital course.   Ramp echo was in 4/24, at 5500 bpm, the IV septum was shifted mildly to the left with moderate RV dilation and dysfunction, AoV opened rarely.  Speed decreased to 5400 bpm, slight opening AoV every beat, trivial AI, midline IV septum.  Speed left at 5400 bpm.   Patient returns for LVAD followup. Weight up 7 lbs.  Increased dyspnea recently, he therefore increased his torsemide a few days ago to 40 qam/60 qpm.  He is feeling better with this change.  No significant exertional dyspnea now.  No orthopnea/PND.  No lightheadedness.   Labs (12/23): K 4.3, creatinine 1.25 => 1.45, Na 128, hgb  8.8 => 9.6, LDH 211, LFTs normal, TSH normal, digoxin 0.8 Labs (1/24): K 3.9, creatinine 1.32, LDH 224, hgb 10.4 Labs (2/24): K 4.5, creatinine 1.33 Labs (4/24): creatinine 1.16, hgb 10.9 => 11 Labs (6/24): K 3.8, creatinine 1.32  PMH: 1. Atrial fibrillation/flutter: Mainly flutter.  - 5/23 Atrial flutter ablation.  2. H/o DVT 3. Chronic systolic CHF: Nonischemic cardiomyopathy.  Medtronic ICD.  - Echo (11/20): EF < 10% - Cath (12/20): Normal coronaries.  - Echo (8/22): EF 30%, mildly decreased RV systolic function.  - Echo (2/23): EF 20-25%, normal RV.  - RHC (11/23): mean RA 14, PA 66/53 mean 54, mean PCWP 43, CI 1.62 F/1.31 T - HM3 LVAD with LAA clipping on 07/04/22.  - Ramp echo (2/24): Speed increased to 5500 rpm.  At this speed, IV septum was midline.  Aortic valve opened 1/3 beats.  Mild RV enlargement with moderate RV dysfunction.  - Ramp echo (3/24): Speed decreased back to 5400 rpm as the IV septum was shifted left.  Midline septum at 5400 rpm.  4. Prior cocaine abuse.  5. Prior heavy ETOH 6. COPD: Moderate obstruction on 11/23 PFTs. No longer smokes.  SH: Patient was a heavy drinker, abused cocaine, and smoked in the past but quit in 2020.  Lives in [place]. He does part-time custodial work.    FH: Mother with PPM, aunt with CHF.    VAD Indication: Destination Therapy due to uncontrolled diabetes   LVAD assessment: HM III: See LVAD nurse's note above.    I reviewed the LVAD parameters from today and compared the results to the patient's prior recorded data. LVAD interrogation was NEGATIVE for significant power changes, NEGATIVE for clinical alarms and STABLE for PI events/speed drops. No programming changes were made and pump is functioning within specified parameters. Pt is performing daily controller and system monitor self tests along with completing weekly and monthly maintenance for LVAD equipment.   LVAD equipment check completed and is in good working order.  Back-up equipment present. Charged back up battery and performed self-test on equipment.    Annual Equipment Maintenance on UBC/PM was performed on 06/2022.      ROS: All systems negative except as listed in HPI, PMH and Problem List.  Current Outpatient Medications  Medication Sig Dispense Refill   atorvastatin (LIPITOR) 20 MG tablet Take 1 tablet (20 mg total) by mouth daily. 90 tablet 3   Calcium Carb-Cholecalciferol 600-10 MG-MCG TABS Take 1 tablet by mouth daily with breakfast. 90 tablet 3   Continuous Blood Gluc Receiver (FREESTYLE LIBRE 3 READER) DEVI 1 each by Does not apply route daily. 2 each 3   Continuous Glucose Sensor (FREESTYLE LIBRE 3 SENSOR) MISC PLACE 1 SENSOR ON THE SKIN EVERY 14 DAYS. USE TO CHECK GLUCOSE CONTINUOUSLY. 2 each 3   digoxin (LANOXIN) 0.125 MG tablet Take 0.5 tablets (0.0625 mg total) by mouth daily. 30 tablet 5   empagliflozin (JARDIANCE) 10 MG TABS tablet Take 1 tablet (10 mg total) by mouth daily. 30 tablet 6   insulin glargine (SEMGLEE, YFGN,) 100 UNIT/ML Solostar Pen Inject 22 Units into the skin at bedtime. 15 mL 0   insulin glargine-yfgn (SEMGLEE) 100 UNIT/ML injection Inject 0.13 mLs (13 Units total) into the skin daily. 10 mL 11   insulin lispro (HUMALOG KWIKPEN) 100 UNIT/ML KwikPen Use 4 units three times a day with meals (Patient taking differently: 10 Units 3 (three) times daily. Use 10 units three times a day with meals) 15 mL 0   Insulin Pen Needle (PEN NEEDLES) 32G X 4 MM MISC Use with insulin pen with breakfast, with lunch, and with evening meal. 100 each 0   levothyroxine (SYNTHROID) 25 MCG tablet Take 1 tablet (25 mcg total) by mouth daily before breakfast. 90 tablet 3   magnesium oxide (MAG-OX) 400 (240 Mg) MG tablet Take 1 tablet (400 mg total) by mouth 2 (two) times daily. 180 tablet 3   Multiple Vitamin (MULTIVITAMIN WITH MINERALS) TABS tablet Take 1 tablet by mouth daily.     pantoprazole (PROTONIX) 40 MG tablet Take 1 tablet (40 mg total)  by mouth daily. 90 tablet 3   potassium chloride SA (KLOR-CON M) 20 MEQ tablet Take 2 tablets (40 mEq total) by mouth daily. 180 tablet 3   sertraline (ZOLOFT) 50 MG tablet Take 1 tablet (50 mg total) by mouth daily. 90 tablet 3   sildenafil (REVATIO) 20 MG tablet Take 1 tablet (20 mg total) by mouth 3 (three) times daily. 90 tablet 6   spironolactone (ALDACTONE) 25 MG tablet Take 1 tablet (25 mg total) by mouth daily. 90 tablet 3   tiotropium (SPIRIVA HANDIHALER) 18 MCG inhalation capsule Place  1 capsule (18 mcg total) into inhaler and inhale daily. 30 capsule 12   warfarin (COUMADIN) 3 MG tablet Take 6 mg (2 tablets) every Tuesday/Thursday/Saturday and 3 mg (1 tablet) all other days or as directed by heart failure clinic 150 tablet 3   acetaminophen (TYLENOL) 325 MG tablet Take 1-2 tablets (325-650 mg total) by mouth every 4 (four) hours as needed for mild pain. (Patient not taking: Reported on 03/12/2023)     enoxaparin (LOVENOX) 40 MG/0.4ML injection Inject 0.4 mLs (40 mg total) into the skin every 12 (twelve) hours. (Patient not taking: Reported on 10/10/2022) 4 mL 0   Fe Fum-Vit C-Vit B12-FA (TRIGELS-F FORTE) CAPS capsule Take 1 capsule by mouth daily after breakfast. (Patient not taking: Reported on 10/10/2022) 30 capsule 6   torsemide (DEMADEX) 20 MG tablet Take 2 tablets (40 mg total) by mouth every morning AND 3 tablets (60 mg total) every evening. Take 3 tablets (60 mg) daily. 150 tablet 6   No current facility-administered medications for this encounter.    Patient has no known allergies.  REVIEW OF SYSTEMS: All systems negative except as listed in HPI, PMH and Problem list.   LVAD INTERROGATION:  See LVAD nurse's note above.   I reviewed the LVAD parameters from today, and compared the results to the patient's prior recorded data.  No programming changes were made.  The LVAD is functioning within specified parameters.  The patient performs LVAD self-test daily.  LVAD interrogation  was negative for any significant power changes, alarms or PI events/speed drops.  LVAD equipment check completed and is in good working order.  Back-up equipment present.   LVAD education done on emergency procedures and precautions and reviewed exit site care.    Vitals:   03/12/23 1021 03/12/23 1022  BP: (!) 110/0 (!) 108/51  Pulse: 88   SpO2: 97%   Weight: 95.6 kg (210 lb 12.8 oz)    Vital Signs:  BP (!) 108/51 Comment: map 83  Pulse 88   Wt 95.6 kg (210 lb 12.8 oz) Comment: with equip  SpO2 97%   BMI 29.40 kg/m  MAP 83 Physical Exam: General: Well appearing this am. NAD.  HEENT: Normal. Neck: Supple, JVP 8 cm. Carotids OK.  Cardiac:  Mechanical heart sounds with LVAD hum present.  Lungs:  CTAB, normal effort.  Abdomen:  NT, ND, no HSM. No bruits or masses. +BS  LVAD exit site: Well-healed and incorporated. Dressing dry and intact. No erythema or drainage. Stabilization device present and accurately applied. Driveline dressing changed daily per sterile technique. Extremities:  Warm and dry. No cyanosis, clubbing, rash, or edema.  Neuro:  Alert & oriented x 3. Cranial nerves grossly intact. Moves all 4 extremities w/o difficulty. Affect pleasant    ASSESSMENT AND PLAN:   1. Chronic systolic CHF: Nonischemic cardiomyopathy, diagnosed 2020.  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  Cath in 12/20 with no significant coronary disease.  Medtronic ICD. Echo in 2/23 showed EF 20-25% with normal RV.  RHC in 11/13 showed markedly elevated filling pressures, primarily pulmonary venous hypertension, low cardiac output, and low PAPI. Echo in 11/23 with EF <20%, mod RV dysfunction.  He was admitted and started on milrinone with improvement in hemodynamics.  We proceeded with HM3 LVAD with LAA clip on 07/04/22.  NYHA  class II, recent volume overload but improved after increasing torsemide to 40 qam/60 qpm.  Weight is still up from prior.  - Continue torsemide 40 qam/60 qpm.  BMET today.  - Continue Jardiance 10 mg daily.  - Continue sildenafil 20 tid for RV.  - Continue spironolactone 25 mg daily.  - Continue digoxin 0.0625 daily, check level today.  2. Atrial fibrillation/atrial flutter: H/o flutter ablation.  Had AF/RVR post-LVAD, went back into NSR.  He is now off amiodarone.   - Continue warfarin.  3. VAD management: s/p HM-3 VAD on 07/04/22. Ramp echo in 4/24, speed decreased to 5400 rpm.  Stable LVAD parameters.  - Now off ASA.  - Continue warfarin, INR goal 2-2.5. 4. H/o DVT: On anticoagulation.  5. CKD stage 3: On Jardiance.  - BMET today.  6. Hypothyroidism - On levothyroxine.  7. Anemia: post-op.  - CBC today.  8. COPD: Moderate on 11/23 PFTs.  Suspect this plays a role in his dyspnea.  - Continue Spiriva.  - Referred to pulmonary in [place].   Followup in 2 months.   Marca Ancona 03/12/2023

## 2023-03-12 NOTE — Patient Instructions (Addendum)
May continue taking Torsemide 40 mg (2 tablets) in the morning and 60 mg (3 tablets) in the evening Coumadin dosing per Lauren PharmD Return to VAD clinic in 2 months for follow up with Dr Shirlee Latch

## 2023-03-27 ENCOUNTER — Ambulatory Visit (HOSPITAL_COMMUNITY): Payer: Self-pay | Admitting: Pharmacist

## 2023-03-27 ENCOUNTER — Other Ambulatory Visit (HOSPITAL_COMMUNITY)
Admission: RE | Admit: 2023-03-27 | Discharge: 2023-03-27 | Disposition: A | Discharge status: Home / Self Care | Payer: 59 | Source: Ambulatory Visit | Attending: Cardiology | Admitting: Cardiology

## 2023-03-27 DIAGNOSIS — Z95811 Presence of heart assist device: Secondary | ICD-10-CM | POA: Insufficient documentation

## 2023-03-27 DIAGNOSIS — Z7901 Long term (current) use of anticoagulants: Secondary | ICD-10-CM | POA: Insufficient documentation

## 2023-03-27 LAB — PROTIME-INR
INR: 1.8 — ABNORMAL HIGH (ref 0.8–1.2)
Prothrombin Time: 21.4 seconds — ABNORMAL HIGH (ref 11.4–15.2)

## 2023-03-28 ENCOUNTER — Other Ambulatory Visit (HOSPITAL_COMMUNITY): Payer: Self-pay | Admitting: Unknown Physician Specialty

## 2023-03-28 DIAGNOSIS — I509 Heart failure, unspecified: Secondary | ICD-10-CM

## 2023-03-28 DIAGNOSIS — Z95811 Presence of heart assist device: Secondary | ICD-10-CM

## 2023-03-28 MED ORDER — EMPAGLIFLOZIN 10 MG PO TABS
10.0000 mg | ORAL_TABLET | Freq: Every day | ORAL | 6 refills | Status: DC
Start: 2023-03-28 — End: 2023-10-01

## 2023-03-29 ENCOUNTER — Ambulatory Visit: Payer: 59 | Admitting: Internal Medicine

## 2023-04-02 ENCOUNTER — Other Ambulatory Visit: Payer: Self-pay | Admitting: Physical Medicine and Rehabilitation

## 2023-04-04 ENCOUNTER — Encounter: Payer: Self-pay | Admitting: Internal Medicine

## 2023-04-04 ENCOUNTER — Telehealth (HOSPITAL_COMMUNITY): Payer: Self-pay

## 2023-04-04 ENCOUNTER — Ambulatory Visit: Payer: 59 | Attending: Internal Medicine | Admitting: Internal Medicine

## 2023-04-04 VITALS — BP 92/0 | HR 89 | Ht 71.0 in | Wt 210.0 lb

## 2023-04-04 DIAGNOSIS — I480 Paroxysmal atrial fibrillation: Secondary | ICD-10-CM

## 2023-04-04 DIAGNOSIS — I509 Heart failure, unspecified: Secondary | ICD-10-CM | POA: Diagnosis not present

## 2023-04-04 LAB — CUP PACEART INCLINIC DEVICE CHECK
Battery Remaining Longevity: 95 mo
Battery Voltage: 2.94 V
Brady Statistic AP VP Percent: 0.01 %
Brady Statistic AP VS Percent: 0.19 %
Brady Statistic AS VP Percent: 0.09 %
Brady Statistic AS VS Percent: 99.71 %
Brady Statistic RA Percent Paced: 0.2 %
Brady Statistic RV Percent Paced: 0.09 %
Date Time Interrogation Session: 20240814154956
HighPow Impedance: 76 Ohm
Implantable Lead Connection Status: 753985
Implantable Lead Implant Date: 20210929
Implantable Lead Location: 753860
Implantable Pulse Generator Implant Date: 20210929
Lead Channel Impedance Value: 304 Ohm
Lead Channel Impedance Value: 418 Ohm
Lead Channel Impedance Value: 456 Ohm
Lead Channel Pacing Threshold Amplitude: 0.75 V
Lead Channel Pacing Threshold Amplitude: 2 V
Lead Channel Pacing Threshold Amplitude: 2.5 V
Lead Channel Pacing Threshold Pulse Width: 0.4 ms
Lead Channel Pacing Threshold Pulse Width: 0.4 ms
Lead Channel Pacing Threshold Pulse Width: 1 ms
Lead Channel Sensing Intrinsic Amplitude: 4.75 mV
Lead Channel Sensing Intrinsic Amplitude: 5.125 mV
Lead Channel Sensing Intrinsic Amplitude: 5.875 mV
Lead Channel Sensing Intrinsic Amplitude: 6.5 mV
Lead Channel Setting Pacing Amplitude: 1.5 V
Lead Channel Setting Pacing Amplitude: 3 V
Lead Channel Setting Pacing Pulse Width: 1 ms
Lead Channel Setting Sensing Sensitivity: 0.3 mV
Zone Setting Status: 755011
Zone Setting Status: 755011

## 2023-04-04 NOTE — Telephone Encounter (Signed)
Pt called VAD Clinic stating he has been having pain around his drive line site for about a week. He states that when he bends or turns he is having severe pain. Pt denies increased drainage, redness or tenderness at the exit site. Pt advised to come to VAD Clinic tomorrow morning to have VAD Coordinator's assess drive line site. Pt scheduled for 0945am tomorrow morning.   Simmie Davies RN, BSN VAD Coordinator 24/7 Pager [phone]

## 2023-04-04 NOTE — Progress Notes (Signed)
HPI Darryl Frye returns today for followup. He is a pleasant 62 yo man with a h/o non-ischemic CM, s/p ICD insertion who carries a diagnosis of atrial fib and flutter but mostly flutter and underwent ablation of typical atrial flutter about a year ago. He has done well in the interim. He has a h/o DVT and is on eliquis for that. He denies any ICD therapies. His LVAD was placed about 10 months ago. No Known Allergies   Current Outpatient Medications  Medication Sig Dispense Refill   acetaminophen (TYLENOL) 325 MG tablet Take 1-2 tablets (325-650 mg total) by mouth every 4 (four) hours as needed for mild pain.     atorvastatin (LIPITOR) 20 MG tablet Take 1 tablet (20 mg total) by mouth daily. 90 tablet 3   Calcium Carb-Cholecalciferol 600-10 MG-MCG TABS Take 1 tablet by mouth daily with breakfast. 90 tablet 3   Continuous Blood Gluc Receiver (FREESTYLE LIBRE 3 READER) DEVI 1 each by Does not apply route daily. 2 each 3   Continuous Glucose Sensor (FREESTYLE LIBRE 3 SENSOR) MISC PLACE 1 SENSOR ON THE SKIN EVERY 14 DAYS 2 each 3   digoxin (LANOXIN) 0.125 MG tablet Take 0.5 tablets (0.0625 mg total) by mouth daily. 30 tablet 5   empagliflozin (JARDIANCE) 10 MG TABS tablet Take 1 tablet (10 mg total) by mouth daily. 30 tablet 6   enoxaparin (LOVENOX) 40 MG/0.4ML injection Inject 0.4 mLs (40 mg total) into the skin every 12 (twelve) hours. 4 mL 0   Fe Fum-Vit C-Vit B12-FA (TRIGELS-F FORTE) CAPS capsule Take 1 capsule by mouth daily after breakfast. 30 capsule 6   insulin glargine (SEMGLEE, YFGN,) 100 UNIT/ML Solostar Pen Inject 22 Units into the skin at bedtime. 15 mL 0   insulin glargine-yfgn (SEMGLEE) 100 UNIT/ML injection Inject 0.13 mLs (13 Units total) into the skin daily. 10 mL 11   insulin lispro (HUMALOG KWIKPEN) 100 UNIT/ML KwikPen Use 4 units three times a day with meals (Patient taking differently: 10 Units 3 (three) times daily. Use 10 units three times a day with meals) 15 mL 0    Insulin Pen Needle (PEN NEEDLES) 32G X 4 MM MISC Use with insulin pen with breakfast, with lunch, and with evening meal. 100 each 0   levothyroxine (SYNTHROID) 25 MCG tablet Take 1 tablet (25 mcg total) by mouth daily before breakfast. 90 tablet 3   magnesium oxide (MAG-OX) 400 (240 Mg) MG tablet Take 1 tablet (400 mg total) by mouth 2 (two) times daily. 180 tablet 3   Multiple Vitamin (MULTIVITAMIN WITH MINERALS) TABS tablet Take 1 tablet by mouth daily.     pantoprazole (PROTONIX) 40 MG tablet Take 1 tablet (40 mg total) by mouth daily. 90 tablet 3   potassium chloride SA (KLOR-CON M) 20 MEQ tablet Take 2 tablets (40 mEq total) by mouth daily. 180 tablet 3   sertraline (ZOLOFT) 50 MG tablet Take 1 tablet (50 mg total) by mouth daily. 90 tablet 3   sildenafil (REVATIO) 20 MG tablet Take 1 tablet (20 mg total) by mouth 3 (three) times daily. 90 tablet 6   spironolactone (ALDACTONE) 25 MG tablet Take 1 tablet (25 mg total) by mouth daily. 90 tablet 3   tiotropium (SPIRIVA HANDIHALER) 18 MCG inhalation capsule Place 1 capsule (18 mcg total) into inhaler and inhale daily. 30 capsule 12   torsemide (DEMADEX) 20 MG tablet Take 2 tablets (40 mg total) by mouth every morning AND 3 tablets (60  mg total) every evening. Take 3 tablets (60 mg) daily. 150 tablet 6   warfarin (COUMADIN) 3 MG tablet Take 6 mg (2 tablets) every Tuesday/Thursday/Saturday and 3 mg (1 tablet) all other days or as directed by heart failure clinic 150 tablet 3   No current facility-administered medications for this visit.     Past Medical History:  Diagnosis Date   Acute kidney injury (HCC)    Acute respiratory failure (HCC)    Atrial flutter (HCC)    on Eliquis   CHF (congestive heart failure) (HCC)    EF 20-25% 10/15/21, AICD in place.   Coronary artery disease    Diabetes mellitus without complication (HCC)    DVT (deep venous thrombosis) (HCC) 06/2019   Heart murmur    Hypertension    Presence of permanent cardiac  pacemaker    Renal disorder     ROS:   All systems reviewed and negative except as noted in the HPI.   Past Surgical History:  Procedure Laterality Date   A-FLUTTER ABLATION N/A 01/12/2022   Procedure: A-FLUTTER ABLATION;  Surgeon: Marinus Maw, MD;  Location: [facility] INVASIVE CV LAB;  Service: Cardiovascular;  Laterality: N/A;   COLONOSCOPY     Dr. Allena Katz; One 4-6 mm hyperplastic rectal polyp, mid left-sided diverticulosis.   CYST REMOVAL NECK     HERNIA REPAIR     INSERTION OF IMPLANTABLE LEFT VENTRICULAR ASSIST DEVICE N/A 07/04/2022   Procedure: INSERTION OF IMPLANTABLE LEFT VENTRICULAR ASSIST DEVICE; ATRICLIP 45;  Surgeon: Lyn Hollingshead, MD;  Location: [facility] OR;  Service: Open Heart Surgery;  Laterality: N/A;   RIGHT HEART CATH N/A 06/27/2022   Procedure: RIGHT HEART CATH;  Surgeon: Laurey Morale, MD;  Location: [facility] INVASIVE CV LAB;  Service: Cardiovascular;  Laterality: N/A;   RIGHT HEART CATH N/A 06/29/2022   Procedure: RIGHT HEART CATH;  Surgeon: Laurey Morale, MD;  Location: [facility] INVASIVE CV LAB;  Service: Cardiovascular;  Laterality: N/A;   TEE WITHOUT CARDIOVERSION N/A 07/04/2022   Procedure: TRANSESOPHAGEAL ECHOCARDIOGRAM (TEE);  Surgeon: Lyn Hollingshead, MD;  Location: [facility] OR;  Service: Open Heart Surgery;  Laterality: N/A;   TONSILLECTOMY       Family History  Problem Relation Age of Onset   Hyperlipidemia Mother    Heart disease Mother        has PPM   Diabetes Mother    Hypertension Mother    Heart disease Maternal Grandmother    Prostate cancer Maternal Grandfather    Colon cancer Neg Hx    Colon polyps Neg Hx      Social History   Socioeconomic History   Marital status: Divorced    Spouse name: Not on file   Number of children: Not on file   Years of education: Not on file   Highest education level: Not on file  Occupational History   Not on file  Tobacco Use   Smoking status: Former    Current packs/day: 0.00    Average packs/day: 1 pack/day for  20.0 years (20.0 ttl pk-yrs)    Types: Cigarettes    Start date: 07/07/1999    Quit date: 07/07/2019    Years since quitting: 3.7   Smokeless tobacco: Never  Vaping Use   Vaping status: Never Used  Substance and Sexual Activity   Alcohol use: Not Currently   Drug use: Not Currently    Types: "Crack" cocaine    Comment: last used over 2 years ago (10/24/21).  Sexual activity: Not on file  Other Topics Concern   Not on file  Social History Narrative   Not on file   Social Determinants of Health   Financial Resource Strain: Low Risk  (06/29/2022)   Overall Financial Resource Strain (CARDIA)    Difficulty of Paying Living Expenses: Not hard at all  Food Insecurity: No Food Insecurity (06/29/2022)   Hunger Vital Sign    Worried About Running Out of Food in the Last Year: Never true    Ran Out of Food in the Last Year: Never true  Transportation Needs: No Transportation Needs (06/29/2022)   PRAPARE - Administrator, Civil Service (Medical): No    Lack of Transportation (Non-Medical): No  Physical Activity: Not on file  Stress: Not on file  Social Connections: Not on file  Intimate Partner Violence: Not At Risk (06/28/2022)   Humiliation, Afraid, Rape, and Kick questionnaire    Fear of Current or Ex-Partner: No    Emotionally Abused: No    Physically Abused: No    Sexually Abused: No     BP (!) 92/0 Comment: LVAD  Pulse 89   Ht 5\' 11"  (1.803 m)   Wt 210 lb (95.3 kg)   SpO2 95%   BMI 29.29 kg/m   Physical Exam:  Well appearing NAD HEENT: Unremarkable Neck:  No JVD, no thyromegally Lymphatics:  No adenopathy Back:  No CVA tenderness Lungs:  Clear with no wheezes HEART:  Regular rate rhythm, no murmurs, no rubs, no clicks Abd:  soft, positive bowel sounds, no organomegally, no rebound, no guarding Ext:  2 plus pulses, no edema, no cyanosis, no clubbing Skin:  No rashes no nodules Neuro:  CN II through XII intact, motor grossly intact  EKG - nsr with noise  artifact  DEVICE  Normal device function.  See PaceArt for details.   Assess/Plan:  Atrial flutter - he is s/p ablation and doing well.  Atrial fib - no evidence of this. He has stopped amio and doing well. ICD - his medtronic DDD ICD is working normally.  Chronic systolic heart failure - his symptoms are class 2. No change in meds.

## 2023-04-04 NOTE — Patient Instructions (Signed)

## 2023-04-05 ENCOUNTER — Encounter: Payer: Self-pay | Admitting: "Endocrinology

## 2023-04-05 ENCOUNTER — Ambulatory Visit (HOSPITAL_COMMUNITY)
Admission: RE | Admit: 2023-04-05 | Discharge: 2023-04-05 | Disposition: A | Discharge status: Home / Self Care | Payer: 59 | Source: Ambulatory Visit | Attending: Cardiology | Admitting: Cardiology

## 2023-04-05 ENCOUNTER — Ambulatory Visit (INDEPENDENT_AMBULATORY_CARE_PROVIDER_SITE_OTHER): Payer: 59 | Admitting: "Endocrinology

## 2023-04-05 VITALS — Ht 71.0 in | Wt 209.2 lb

## 2023-04-05 DIAGNOSIS — Z794 Long term (current) use of insulin: Secondary | ICD-10-CM | POA: Diagnosis not present

## 2023-04-05 DIAGNOSIS — E782 Mixed hyperlipidemia: Secondary | ICD-10-CM

## 2023-04-05 DIAGNOSIS — Z4801 Encounter for change or removal of surgical wound dressing: Secondary | ICD-10-CM | POA: Insufficient documentation

## 2023-04-05 DIAGNOSIS — I10 Essential (primary) hypertension: Secondary | ICD-10-CM

## 2023-04-05 DIAGNOSIS — Z95811 Presence of heart assist device: Secondary | ICD-10-CM | POA: Diagnosis not present

## 2023-04-05 DIAGNOSIS — E1169 Type 2 diabetes mellitus with other specified complication: Secondary | ICD-10-CM

## 2023-04-05 DIAGNOSIS — E119 Type 2 diabetes mellitus without complications: Secondary | ICD-10-CM

## 2023-04-05 LAB — POCT GLYCOSYLATED HEMOGLOBIN (HGB A1C): HbA1c, POC (controlled diabetic range): 11.1 % — AB (ref 0.0–7.0)

## 2023-04-05 MED ORDER — INSULIN LISPRO (1 UNIT DIAL) 100 UNIT/ML (KWIKPEN)
8.0000 [IU] | PEN_INJECTOR | Freq: Three times a day (TID) | SUBCUTANEOUS | 1 refills | Status: DC
Start: 2023-04-05 — End: 2023-04-16

## 2023-04-05 MED ORDER — SEMGLEE 100 UNIT/ML ~~LOC~~ SOPN: 30.0000 [IU] | PEN_INJECTOR | Freq: Every day | SUBCUTANEOUS | 1 refills | Status: DC

## 2023-04-05 MED ORDER — FREESTYLE LIBRE 3 READER DEVI: 1.0000 | Freq: Every day | 3 refills | Status: AC

## 2023-04-05 NOTE — Progress Notes (Signed)
Patient presents to clinic today for drive line exit wound care. Existing VAD dressing removed and site care performed using sterile technique. Drive line exit site cleaned with Chlora prep applicators x 2, allowed to dry, and Sorbaview dressing with Silverlon patch applied. Exit site healed and partially incorporated, the velour is fully implanted at exit site. Slight redness, no tenderness, drainage, foul odor or rash noted.There is a small scab around driveline. Drive line anchor re-applied. Pt denies fever or chills.     Pt tells me that he has started going to the gym. He states that he is having sharp pains in his driveline when he moves suddenly. The driveline is unremarkable. It is partially incorporated so the pt may have had slight trauma this week in the gym. Pt was instructed to use caution this week with his driveline. Pt informed that if he has any drainage or the pain increases significantly to call the VAD clinic.   Return in a couple weeks for a full visit with Dr Shirlee Latch.  Carlton Adam RN, BSN VAD Coordinator 24/7 Pager [phone]

## 2023-04-05 NOTE — Patient Instructions (Signed)
                                     Advice for Weight Management  -For most of us the best way to lose weight is by diet management. Generally speaking, diet management means consuming less calories intentionally which over time brings about progressive weight loss.  This can be achieved more effectively by avoiding ultra processed carbohydrates, processed meats, unhealthy fats.    It is critically important to know your numbers: how much calorie you are consuming and how much calorie you need. More importantly, our carbohydrates sources should be unprocessed naturally occurring  complex starch food items.  It is always important to balance nutrition also by  appropriate intake of proteins (mainly plant-based), healthy fats/oils, plenty of fruits and vegetables.   -The American College of Lifestyle Medicine (ACL M) recommends nutrition derived mostly from Whole Food, Plant Predominant Sources example an apple instead of applesauce or apple pie. Eat Plenty of vegetables, Mushrooms, fruits, Legumes, Whole Grains, Nuts, seeds in lieu of processed meats, processed snacks/pastries red meat, poultry, eggs.  Use only water or unsweetened tea for hydration.  The College also recommends the need to stay away from risky substances including alcohol, smoking; obtaining 7-9 hours of restorative sleep, at least 150 minutes of moderate intensity exercise weekly, importance of healthy social connections, and being mindful of stress and seek help when it is overwhelming.    -Sticking to a routine mealtime to eat 3 meals a day and avoiding unnecessary snacks is shown to have a big role in weight control. Under normal circumstances, the only time we burn stored energy is when we are hungry, so allow  some hunger to take place- hunger means no food between appropriate meal times, only water.  It is not advisable to starve.   -It is better to avoid simple carbohydrates including:  Cakes, Sweet Desserts, Ice Cream, Soda (diet and regular), Sweet Tea, Candies, Chips, Cookies, Store Bought Juices, Alcohol in Excess of  1-2 drinks a day, Lemonade,  Artificial Sweeteners, Doughnuts, Coffee Creamers, "Sugar-free" Products, etc, etc.  This is not a complete list.....    -Consulting with certified diabetes educators is proven to provide you with the most accurate and current information on diet.  Also, you may be  interested in discussing diet options/exchanges , we can schedule a visit with Darryl Frye, RDN, CDE for individualized nutrition education.  -Exercise: If you are able: 30 -60 minutes a day ,4 days a week, or 150 minutes of moderate intensity exercise weekly.    The longer the better if tolerated.  Combine stretch, strength, and aerobic activities.  If you were told in the past that you have high risk for cardiovascular diseases, or if you are currently symptomatic, you may seek evaluation by your heart doctor prior to initiating moderate to intense exercise programs.                                  Additional Care Considerations for Diabetes/Prediabetes   -Diabetes  is a chronic disease.  The most important care consideration is regular follow-up with your diabetes care provider with the goal being avoiding or delaying its complications and to take advantage of advances in medications and technology.  If appropriate actions are taken early enough, type 2 diabetes can even be   reversed.  Seek information from the right source.  - Whole Food, Plant Predominant Nutrition is highly recommended: Eat Plenty of vegetables, Mushrooms, fruits, Legumes, Whole Grains, Nuts, seeds in lieu of processed meats, processed snacks/pastries red meat, poultry, eggs as recommended by American College of  Lifestyle Medicine (ACLM).  -Type 2 diabetes is known to coexist with other important comorbidities such as high blood pressure and high cholesterol.  It is critical to control not only the  diabetes but also the high blood pressure and high cholesterol to minimize and delay the risk of complications including coronary artery disease, stroke, amputations, blindness, etc.  The good news is that this diet recommendation for type 2 diabetes is also very helpful for managing high cholesterol and high blood blood pressure.  - Studies showed that people with diabetes will benefit from a class of medications known as ACE inhibitors and statins.  Unless there are specific reasons not to be on these medications, the standard of care is to consider getting one from these groups of medications at an optimal doses.  These medications are generally considered safe and proven to help protect the heart and the kidneys.    - People with diabetes are encouraged to initiate and maintain regular follow-up with eye doctors, foot doctors, dentists , and if necessary heart and kidney doctors.     - It is highly recommended that people with diabetes quit smoking or stay away from smoking, and get yearly  flu vaccine and pneumonia vaccine at least every 5 years.  See above for additional recommendations on exercise, sleep, stress management , and healthy social connections.      

## 2023-04-05 NOTE — Progress Notes (Signed)
Endocrinology Consult Note       04/05/2023, 2:37 PM   Subjective:    Patient ID: Darryl Frye, male    DOB: [birth year].  Darryl Frye is being seen in consultation for management of currently uncontrolled symptomatic diabetes requested by  Darryl Pearson, Darryl Frye.   Past Medical History:  Diagnosis Date   Acute kidney injury (HCC)    Acute respiratory failure (HCC)    Atrial flutter (HCC)    on Eliquis   CHF (congestive heart failure) (HCC)    EF 20-25% 10/15/21, AICD in place.   Coronary artery disease    Diabetes mellitus without complication (HCC)    DVT (deep venous thrombosis) (HCC) 06/2019   Heart murmur    Hypertension    Presence of permanent cardiac pacemaker    Renal disorder     Past Surgical History:  Procedure Laterality Date   A-FLUTTER ABLATION N/A 01/12/2022   Procedure: A-FLUTTER ABLATION;  Surgeon: Marinus Maw, MD;  Location: [facility] INVASIVE CV LAB;  Service: Cardiovascular;  Laterality: N/A;   COLONOSCOPY     Dr. Allena Katz; One 4-6 mm hyperplastic rectal polyp, mid left-sided diverticulosis.   CYST REMOVAL NECK     HERNIA REPAIR     INSERTION OF IMPLANTABLE LEFT VENTRICULAR ASSIST DEVICE N/A 07/04/2022   Procedure: INSERTION OF IMPLANTABLE LEFT VENTRICULAR ASSIST DEVICE; ATRICLIP 45;  Surgeon: Lyn Hollingshead, MD;  Location: [facility] OR;  Service: Open Heart Surgery;  Laterality: N/A;   RIGHT HEART CATH N/A 06/27/2022   Procedure: RIGHT HEART CATH;  Surgeon: Laurey Morale, MD;  Location: [facility] INVASIVE CV LAB;  Service: Cardiovascular;  Laterality: N/A;   RIGHT HEART CATH N/A 06/29/2022   Procedure: RIGHT HEART CATH;  Surgeon: Laurey Morale, MD;  Location: [facility] INVASIVE CV LAB;  Service: Cardiovascular;  Laterality: N/A;   TEE WITHOUT CARDIOVERSION N/A 07/04/2022   Procedure: TRANSESOPHAGEAL ECHOCARDIOGRAM (TEE);  Surgeon: Lyn Hollingshead, MD;  Location: [facility] OR;  Service: Open Heart  Surgery;  Laterality: N/A;   TONSILLECTOMY      Social History   Socioeconomic History   Marital status: Divorced    Spouse name: Not on file   Number of children: Not on file   Years of education: Not on file   Highest education level: Not on file  Occupational History   Not on file  Tobacco Use   Smoking status: Former    Current packs/day: 0.00    Average packs/day: 1 pack/day for 20.0 years (20.0 ttl pk-yrs)    Types: Cigarettes    Start date: 07/07/1999    Quit date: 07/07/2019    Years since quitting: 3.7   Smokeless tobacco: Never  Vaping Use   Vaping status: Never Used  Substance and Sexual Activity   Alcohol use: Not Currently   Drug use: Not Currently    Types: "Crack" cocaine    Comment: last used over 2 years ago (10/24/21).   Sexual activity: Not on file  Other Topics Concern   Not on file  Social History Narrative   Not on file   Social Determinants of Health  Financial Resource Strain: Low Risk  (06/29/2022)   Overall Financial Resource Strain (CARDIA)    Difficulty of Paying Living Expenses: Not hard at all  Food Insecurity: No Food Insecurity (06/29/2022)   Hunger Vital Sign    Worried About Running Out of Food in the Last Year: Never true    Ran Out of Food in the Last Year: Never true  Transportation Needs: No Transportation Needs (06/29/2022)   PRAPARE - Administrator, Civil Service (Medical): No    Lack of Transportation (Non-Medical): No  Physical Activity: Not on file  Stress: Not on file  Social Connections: Not on file    Family History  Problem Relation Age of Onset   Hyperlipidemia Mother    Heart disease Mother        has PPM   Diabetes Mother    Hypertension Mother    Heart disease Maternal Grandmother    Prostate cancer Maternal Grandfather    Colon cancer Neg Hx    Colon polyps Neg Hx     Outpatient Encounter Medications as of 04/05/2023  Medication Sig   acetaminophen (TYLENOL) 325 MG tablet Take 1-2 tablets  (325-650 mg total) by mouth every 4 (four) hours as needed for mild pain.   atorvastatin (LIPITOR) 20 MG tablet Take 1 tablet (20 mg total) by mouth daily.   Calcium Carb-Cholecalciferol 600-10 MG-MCG TABS Take 1 tablet by mouth daily with breakfast.   Continuous Glucose Receiver (FREESTYLE LIBRE 3 READER) DEVI 1 each by Does not apply route daily.   Continuous Glucose Sensor (FREESTYLE LIBRE 3 SENSOR) MISC PLACE 1 SENSOR ON THE SKIN EVERY 14 DAYS   digoxin (LANOXIN) 0.125 MG tablet Take 0.5 tablets (0.0625 mg total) by mouth daily.   empagliflozin (JARDIANCE) 10 MG TABS tablet Take 1 tablet (10 mg total) by mouth daily.   enoxaparin (LOVENOX) 40 MG/0.4ML injection Inject 0.4 mLs (40 mg total) into the skin every 12 (twelve) hours. (Patient not taking: Reported on 04/05/2023)   Fe Fum-Vit C-Vit B12-FA (TRIGELS-F FORTE) CAPS capsule Take 1 capsule by mouth daily after breakfast.   insulin glargine (SEMGLEE, YFGN,) 100 UNIT/ML Solostar Pen Inject 30 Units into the skin at bedtime.   insulin lispro (HUMALOG KWIKPEN) 100 UNIT/ML KwikPen Inject 8-14 Units into the skin 3 (three) times daily before meals. Use 4 units three times a day with meals   Insulin Pen Needle (PEN NEEDLES) 32G X 4 MM MISC Use with insulin pen with breakfast, with lunch, and with evening meal.   levothyroxine (SYNTHROID) 25 MCG tablet Take 1 tablet (25 mcg total) by mouth daily before breakfast.   magnesium oxide (MAG-OX) 400 (240 Mg) MG tablet Take 1 tablet (400 mg total) by mouth 2 (two) times daily.   Multiple Vitamin (MULTIVITAMIN WITH MINERALS) TABS tablet Take 1 tablet by mouth daily.   pantoprazole (PROTONIX) 40 MG tablet Take 1 tablet (40 mg total) by mouth daily.   potassium chloride SA (KLOR-CON M) 20 MEQ tablet Take 2 tablets (40 mEq total) by mouth daily.   sertraline (ZOLOFT) 50 MG tablet Take 1 tablet (50 mg total) by mouth daily.   sildenafil (REVATIO) 20 MG tablet Take 1 tablet (20 mg total) by mouth 3 (three) times  daily.   spironolactone (ALDACTONE) 25 MG tablet Take 1 tablet (25 mg total) by mouth daily.   tiotropium (SPIRIVA HANDIHALER) 18 MCG inhalation capsule Place 1 capsule (18 mcg total) into inhaler and inhale daily.   torsemide (DEMADEX) 20  MG tablet Take 2 tablets (40 mg total) by mouth every morning AND 3 tablets (60 mg total) every evening. Take 3 tablets (60 mg) daily.   warfarin (COUMADIN) 3 MG tablet Take 6 mg (2 tablets) every Tuesday/Thursday/Saturday and 3 mg (1 tablet) all other days or as directed by heart failure clinic   [DISCONTINUED] Continuous Blood Gluc Receiver (FREESTYLE LIBRE 3 READER) DEVI 1 each by Does not apply route daily.   [DISCONTINUED] insulin glargine (SEMGLEE, YFGN,) 100 UNIT/ML Solostar Pen Inject 22 Units into the skin at bedtime. (Patient taking differently: Inject 22 Units into the skin 2 (two) times daily.)   [DISCONTINUED] insulin glargine-yfgn (SEMGLEE) 100 UNIT/ML injection Inject 0.13 mLs (13 Units total) into the skin daily.   [DISCONTINUED] insulin lispro (HUMALOG KWIKPEN) 100 UNIT/ML KwikPen Use 4 units three times a day with meals (Patient taking differently: 10 Units 3 (three) times daily. Use 10 units three times a day with meals)   No facility-administered encounter medications on file as of 04/05/2023.    ALLERGIES: No Known Allergies  VACCINATION STATUS: Immunization History  Administered Date(s) Administered   Influenza,inj,Quad PF,6+ Mos 07/24/2022   Moderna Sars-Covid-2 Vaccination 11/25/2019, 12/23/2019   PNEUMOCOCCAL CONJUGATE-20 07/24/2022    Diabetes He presents for his initial diabetic visit. He has type 2 diabetes mellitus. Onset time: He was diagnosed at approximate age of 81. His disease course has been worsening. There are no hypoglycemic associated symptoms. Pertinent negatives for hypoglycemia include no confusion, headaches, pallor or seizures. Associated symptoms include polydipsia and polyuria. Pertinent negatives for diabetes  include no chest pain, no fatigue, no polyphagia and no weakness. There are no hypoglycemic complications. Diabetic complications include heart disease, impotence and nephropathy. (His diabetes is complicated by CHF with ejection fraction of 20-25% status post permanent cardiac pacemaker, coronary artery disease, comorbid hypertension/hyper lipidemia.) Risk factors for coronary artery disease include dyslipidemia, diabetes mellitus, male sex, hypertension, tobacco exposure and sedentary lifestyle. Current diabetic treatments: Is currently on Lantus 22 units nightly, Humalog 10 units 3 times daily AC, Jardiance 10 mg p.o. daily. His weight is fluctuating minimally. He is following a generally unhealthy diet. When asked about meal planning, he reported none. He has not had a previous visit with a dietitian. He never participates in exercise. His overall blood glucose range is >200 mg/dl. (He has a freestyle libre 3 sensor  at home, did not bring any logs nor meter.  He has not been using his device due to the fact that he lost the reader.  He denies hypoglycemia. His point-of-care A1c is 11.1%.) An ACE inhibitor/angiotensin II receptor blocker is not being taken.  Hyperlipidemia Exacerbating diseases include chronic renal disease. Pertinent negatives include no chest pain, myalgias or shortness of breath.  Hypertension This is a chronic problem. The problem is controlled. Pertinent negatives include no chest pain, headaches, neck pain, palpitations or shortness of breath. Risk factors for coronary artery disease include dyslipidemia, diabetes mellitus, male gender, sedentary lifestyle and smoking/tobacco exposure. Past treatments include diuretics. Hypertensive end-organ damage includes kidney disease, CAD/MI and heart failure. Identifiable causes of hypertension include chronic renal disease.     Review of Systems  Constitutional:  Negative for chills, fatigue, fever and unexpected weight change.  HENT:   Negative for dental problem, mouth sores and trouble swallowing.   Eyes:  Negative for visual disturbance.  Respiratory:  Negative for cough, choking, chest tightness, shortness of breath and wheezing.   Cardiovascular:  Negative for chest pain, palpitations and leg swelling.  Gastrointestinal:  Negative for abdominal distention, abdominal pain, constipation, diarrhea, nausea and vomiting.  Endocrine: Positive for polydipsia and polyuria. Negative for polyphagia.  Genitourinary:  Positive for impotence. Negative for dysuria, flank pain, hematuria and urgency.  Musculoskeletal:  Negative for back pain, gait problem, myalgias and neck pain.  Skin:  Negative for pallor, rash and wound.  Neurological:  Negative for seizures, syncope, weakness, numbness and headaches.  Psychiatric/Behavioral:  Negative for confusion and dysphoric mood.     Objective:       04/05/2023    1:27 PM 04/04/2023    3:17 PM 03/12/2023   10:22 AM  Vitals with BMI  Height 5\' 11"  5\' 11"    Weight 209 lbs 3 oz 210 lbs   BMI 29.19 29.3   Systolic  92 108  Diastolic  0 51  Pulse  89     Ht 5\' 11"  (1.803 m)   Wt 209 lb 3.2 oz (94.9 kg)   BMI 29.18 kg/m   Wt Readings from Last 3 Encounters:  04/05/23 209 lb 3.2 oz (94.9 kg)  04/04/23 210 lb (95.3 kg)  03/12/23 210 lb 12.8 oz (95.6 kg)     Physical Exam Constitutional:      General: He is not in acute distress.    Appearance: He is well-developed.  HENT:     Head: Normocephalic and atraumatic.  Neck:     Thyroid: No thyromegaly.     Trachea: No tracheal deviation.  Cardiovascular:     Rate and Rhythm: Normal rate.     Pulses:          Dorsalis pedis pulses are 1+ on the right side and 1+ on the left side.       Posterior tibial pulses are 1+ on the right side and 1+ on the left side.     Heart sounds: S1 normal and S2 normal. Murmur heard.     No gallop.     Comments: Patient with CHF ejection fraction 20-25%, with permanent pacemaker. Pulmonary:      Effort: No respiratory distress.     Breath sounds: Normal breath sounds. No wheezing.  Abdominal:     General: Bowel sounds are normal. There is no distension.     Palpations: Abdomen is soft.     Tenderness: There is no abdominal tenderness. There is no guarding.  Musculoskeletal:     Right shoulder: No swelling or deformity.     Cervical back: Normal range of motion and neck supple.  Skin:    General: Skin is warm and dry.     Findings: No rash.     Nails: There is no clubbing.  Neurological:     Mental Status: He is alert and oriented to person, place, and time.     Cranial Nerves: No cranial nerve deficit.     Sensory: No sensory deficit.     Gait: Gait normal.     Deep Tendon Reflexes: Reflexes are normal and symmetric.  Psychiatric:        Speech: Speech normal.        Behavior: Behavior normal. Behavior is cooperative.        Thought Content: Thought content normal.        Judgment: Judgment normal.     CMP ( most recent) CMP     Component Value Date/Time   NA 131 (L) 03/12/2023 1011   NA 135 11/23/2021 0814   K 4.0 03/12/2023 1011   CL 92 (L) 03/12/2023 1011  CO2 26 03/12/2023 1011   GLUCOSE 182 (H) 03/12/2023 1011   BUN 34 (H) 03/12/2023 1011   BUN 20 11/23/2021 0814   CREATININE 1.39 (H) 03/12/2023 1011   CALCIUM 8.9 03/12/2023 1011   PROT 8.7 (H) 01/01/2023 1004   ALBUMIN 4.0 01/01/2023 1004   AST 21 01/01/2023 1004   ALT 25 01/01/2023 1004   ALKPHOS 163 (H) 01/01/2023 1004   BILITOT 0.8 01/01/2023 1004   EGFR 58 (L) 11/23/2021 0814   GFRNONAA 58 (L) 03/12/2023 1011     Diabetic Labs (most recent): Lab Results  Component Value Date   HGBA1C 11.1 (A) 04/05/2023   HGBA1C 9.3 (H) 06/27/2022   HGBA1C 9.7 (H) 10/15/2021     Lipid Panel ( most recent) Lipid Panel     Component Value Date/Time   CHOL 212 (H) 06/27/2022 1825   TRIG 134 06/27/2022 1825   HDL 55 06/27/2022 1825   CHOLHDL 3.9 06/27/2022 1825   VLDL 27 06/27/2022 1825   LDLCALC  130 (H) 06/27/2022 1825      Lab Results  Component Value Date   TSH 8.560 (H) 10/10/2022   TSH 4.362 08/17/2022   TSH 2.576 06/27/2022   TSH 2.723 10/15/2021   FREET4 1.28 (H) 08/17/2022   FREET4 1.06 06/27/2022      Assessment & Plan:   1. Type 2 diabetes mellitus with other specified complication, with Rinck-term current use of insulin (HCC) - Darryl Frye has currently uncontrolled symptomatic type 2 DM since  62 years of age,  with most recent A1c of 11.1 %. Recent labs reviewed. - I had a Darryl Frye discussion with him about the possible risk factors and  the pathology behind its diabetes and its complications. -his diabetes is complicated by  by CHF with ejection fraction of 20-25% status post permanent cardiac pacemaker, coronary artery disease, comorbid hypertension/hyper lipidemia and he remains at exceedingly high risk for more acute and chronic complications which include CAD, CVA, CKD, retinopathy, and neuropathy. These are all discussed in detail with him.  - I discussed all available options of managing his diabetes including de-escalation of medications. I have counseled him on Food as Medicine by adopting a Whole Food , Plant Predominant  ( WFPP) nutrition as recommended by [employer] of Lifestyle Medicine. Patient is encouraged to switch to  unprocessed or minimally processed  complex starch, adequate protein intake (mainly plant source), minimal liquid fat, plenty of fruits, and vegetables. -  he is advised to stick to a routine mealtimes to eat 3 complete meals a day and snack only when necessary ( to snack only to correct hypoglycemia BG <70 day time or <100 at night).   - he acknowledges that there is a room for improvement in his food and drink choices. - Further Specific Suggestion is made for him to avoid simple carbohydrates  from his diet including Cakes, Sweet Desserts, Ice Cream, Soda (diet and regular), Sweet Tea, Candies, Chips, Cookies, Store Bought Juices,  Alcohol ,  Artificial Sweeteners,  Coffee Creamer, and "Sugar-free" Products. This will help patient to have more stable blood glucose profile and potentially avoid unintended weight gain.  The following Lifestyle Medicine recommendations according to American College of Lifestyle Medicine [facility]) were discussed and offered to patient and he agrees to start the journey:  A.  Whole Foods, Plant-based plate comprising of fruits and vegetables, plant-based proteins, whole-grain carbohydrates was discussed in detail with the patient.   A list for source of those nutrients  were also provided to the patient.  Patient will use only water or unsweetened tea for hydration. B.  The need to stay away from risky substances including alcohol, smoking; obtaining 7 to 9 hours of restorative sleep, at least 150 minutes of moderate intensity exercise weekly, the importance of healthy social connections,  and stress reduction techniques were discussed. C.  A full color page of  Calorie density of various food groups per pound showing examples of each food groups was provided to the patient.  - he will be scheduled with Darryl Frye, Darryl Frye, Darryl Frye for individualized diabetes education.  - I have approached him with the following individualized plan to manage  his diabetes and patient agrees:   -In light of his presentation with significant hyperglycemic burden, he will continue to need intensive treatment with basal/bolus insulin in order for him to achieve and maintain control of diabetes to target. - Accordingly, he is advised to increase his Lantus to 30 units nightly, advised to adjust his Humalog to 8-14  units 3 times a day with meals  for pre-meal BG readings of 90-150mg /dl, plus patient specific correction dose for unexpected hyperglycemia above 150mg /dl, advised to resume and continue his CGM device.  I ordered the freestyle libre 3 reader for him.    - he is warned not to take insulin without proper monitoring per  orders. - Adjustment parameters are given to him for hypo and hyperglycemia in writing. - he is encouraged to call clinic for blood glucose levels less than 70 or above 300 mg /dl. -He will continue to benefit from SGLT2 inhibitor.  I advised him to continue Jardiance 10 mg p.o. daily at breakfast.  - he will be considered for incretin therapy as appropriate next visit.  - Specific targets for  A1c;  LDL, HDL,  and Triglycerides were discussed with the patient.  2) Blood Pressure /Hypertension:   he is advised to continue his current medications including spironolactone 25 mg p.o. daily at breakfast.  He is also on Lasix 40 mg p.o. daily in the morning and 60 mg p.o. daily in the evening. 3) Lipids/Hyperlipidemia:   Review of his recent lipid panel showed uncontrolled  LDL at 130 .  he  is advised to continue    Lipitor 20 mg daily at bedtime.  Side effects and precautions discussed with him.  4)  Weight/Diet:  Body mass index is 29.18 kg/m.  -   clearly complicating his diabetes care.   he is  a candidate for weight loss. I discussed with him the fact that loss of 5 - 10% of his  current body weight will have the most impact on his diabetes management.  The above detailed  ACLM recommendations for nutrition, exercise, sleep, social life, avoidance of risky substances, the need for restorative sleep   information will also detailed on discharge instructions.  5) hypothyroidism: The circumstance of his diagnosis are not available today.  His advised to continue levothyroxine 25 mcg p.o. daily before breakfast.  - We discussed about the correct intake of his thyroid hormone, on empty stomach at fasting, with water, separated by at least 30 minutes from breakfast and other medications,  and separated by more than 4 hours from calcium, iron, multivitamins, acid reflux medications (PPIs). -Patient is made aware of the fact that thyroid hormone replacement is needed for life, dose to be adjusted by  periodic monitoring of thyroid function tests.   6) Chronic Care/Health Maintenance:  -he  10 on  Statin medications and  is encouraged to initiate and continue to follow up with Ophthalmology, Dentist,  Podiatrist at least yearly or according to recommendations, and advised to   stay away from smoking. I have recommended yearly flu vaccine and pneumonia vaccine at least every 5 years; moderate intensity exercise for up to 150 minutes weekly; and  sleep for 7- 9 hours a day.  - he is  advised to maintain close follow up with Darryl Pearson, Darryl Frye for primary care needs, as well as his other providers for optimal and coordinated care.   Thank you for involving me in the care of this pleasant patient.  I spent  63  minutes in the care of the patient today including review of labs from CMP, Lipids, Thyroid Function, Hematology (current and previous including abstractions from other facilities); face-to-face time discussing  his blood glucose readings/logs, discussing hypoglycemia and hyperglycemia episodes and symptoms, medications doses, his options of short and [place] term treatment based on the latest standards of care / guidelines;  discussion about incorporating lifestyle medicine;  and documenting the encounter. Risk reduction counseling performed per USPSTF guidelines to reduce  obesity and cardiovascular risk factors.      Please refer to Patient Instructions for Blood Glucose Monitoring and Insulin/Medications Dosing Guide"  in media tab for additional information. Please  also refer to " Patient Self Inventory" in the Media  tab for reviewed elements of pertinent patient history.  Darryl Frye participated in the discussions, expressed understanding, and voiced agreement with the above plans.  All questions were answered to his satisfaction. he is encouraged to contact clinic should he have any questions or concerns prior to his return visit.   Follow up plan: - Return in about 2 weeks  (around 04/19/2023) for F/U with Meter/CGM /Logs Only - no Labs.  Marquis Lunch, MD [facility] [address] [place], [place] [address] Phone: [phone]  Fax: [phone]    04/05/2023, 2:37 PM  This note was partially dictated with voice recognition software. Similar sounding words can be transcribed inadequately or may not  be corrected upon review.

## 2023-04-10 ENCOUNTER — Ambulatory Visit (HOSPITAL_COMMUNITY): Payer: Self-pay | Admitting: Pharmacist

## 2023-04-10 LAB — PROTIME-INR
INR: 1.5 — ABNORMAL HIGH (ref 0.9–1.2)
Prothrombin Time: 16.8 s — ABNORMAL HIGH (ref 9.1–12.0)

## 2023-04-10 NOTE — Progress Notes (Signed)
LVAD INR 

## 2023-04-16 ENCOUNTER — Ambulatory Visit (INDEPENDENT_AMBULATORY_CARE_PROVIDER_SITE_OTHER): Payer: 59 | Admitting: "Endocrinology

## 2023-04-16 ENCOUNTER — Encounter: Payer: Self-pay | Admitting: "Endocrinology

## 2023-04-16 VITALS — Ht 71.0 in | Wt 204.4 lb

## 2023-04-16 DIAGNOSIS — I10 Essential (primary) hypertension: Secondary | ICD-10-CM | POA: Diagnosis not present

## 2023-04-16 DIAGNOSIS — Z794 Long term (current) use of insulin: Secondary | ICD-10-CM | POA: Diagnosis not present

## 2023-04-16 DIAGNOSIS — E119 Type 2 diabetes mellitus without complications: Secondary | ICD-10-CM

## 2023-04-16 DIAGNOSIS — E782 Mixed hyperlipidemia: Secondary | ICD-10-CM

## 2023-04-16 DIAGNOSIS — E1169 Type 2 diabetes mellitus with other specified complication: Secondary | ICD-10-CM | POA: Diagnosis not present

## 2023-04-16 DIAGNOSIS — Z95811 Presence of heart assist device: Secondary | ICD-10-CM

## 2023-04-16 MED ORDER — INSULIN LISPRO (1 UNIT DIAL) 100 UNIT/ML (KWIKPEN): 6.0000 [IU] | PEN_INJECTOR | Freq: Three times a day (TID) | SUBCUTANEOUS | 1 refills | Status: DC

## 2023-04-16 NOTE — Progress Notes (Signed)
Endocrinology Consult Note       04/16/2023, 12:50 PM   Subjective:    Patient ID: Darryl Frye, male    DOB: Mar 26, 1961.  Darryl Frye is being seen in consultation for management of currently uncontrolled symptomatic diabetes requested by  Marylynn Pearson, FNP.   Past Medical History:  Diagnosis Date   Acute kidney injury (HCC)    Acute respiratory failure (HCC)    Atrial flutter (HCC)    on Eliquis   CHF (congestive heart failure) (HCC)    EF 20-25% 10/15/21, AICD in place.   Coronary artery disease    Diabetes mellitus without complication (HCC)    DVT (deep venous thrombosis) (HCC) 06/2019   Heart murmur    Hypertension    Presence of permanent cardiac pacemaker    Renal disorder     Past Surgical History:  Procedure Laterality Date   A-FLUTTER ABLATION N/A 01/12/2022   Procedure: A-FLUTTER ABLATION;  Surgeon: Marinus Maw, MD;  Location: [facility] INVASIVE CV LAB;  Service: Cardiovascular;  Laterality: N/A;   COLONOSCOPY     Dr. Allena Katz; One 4-6 mm hyperplastic rectal polyp, mid left-sided diverticulosis.   CYST REMOVAL NECK     HERNIA REPAIR     INSERTION OF IMPLANTABLE LEFT VENTRICULAR ASSIST DEVICE N/A 07/04/2022   Procedure: INSERTION OF IMPLANTABLE LEFT VENTRICULAR ASSIST DEVICE; ATRICLIP 45;  Surgeon: Lyn Hollingshead, MD;  Location: [facility] OR;  Service: Open Heart Surgery;  Laterality: N/A;   RIGHT HEART CATH N/A 06/27/2022   Procedure: RIGHT HEART CATH;  Surgeon: Laurey Morale, MD;  Location: [facility] INVASIVE CV LAB;  Service: Cardiovascular;  Laterality: N/A;   RIGHT HEART CATH N/A 06/29/2022   Procedure: RIGHT HEART CATH;  Surgeon: Laurey Morale, MD;  Location: [facility] INVASIVE CV LAB;  Service: Cardiovascular;  Laterality: N/A;   TEE WITHOUT CARDIOVERSION N/A 07/04/2022   Procedure: TRANSESOPHAGEAL ECHOCARDIOGRAM (TEE);  Surgeon: Lyn Hollingshead, MD;  Location: [facility] OR;  Service: Open Heart  Surgery;  Laterality: N/A;   TONSILLECTOMY      Social History   Socioeconomic History   Marital status: Divorced    Spouse name: Not on file   Number of children: Not on file   Years of education: Not on file   Highest education level: Not on file  Occupational History   Not on file  Tobacco Use   Smoking status: Former    Current packs/day: 0.00    Average packs/day: 1 pack/day for 20.0 years (20.0 ttl pk-yrs)    Types: Cigarettes    Start date: 07/07/1999    Quit date: 07/07/2019    Years since quitting: 3.7   Smokeless tobacco: Never  Vaping Use   Vaping status: Never Used  Substance and Sexual Activity   Alcohol use: Not Currently   Drug use: Not Currently    Types: "Crack" cocaine    Comment: last used over 2 years ago (10/24/21).   Sexual activity: Not on file  Other Topics Concern   Not on file  Social History Narrative   Not on file   Social Determinants of Health  Financial Resource Strain: Low Risk  (06/29/2022)   Overall Financial Resource Strain (CARDIA)    Difficulty of Paying Living Expenses: Not hard at all  Food Insecurity: No Food Insecurity (06/29/2022)   Hunger Vital Sign    Worried About Running Out of Food in the Last Year: Never true    Ran Out of Food in the Last Year: Never true  Transportation Needs: No Transportation Needs (06/29/2022)   PRAPARE - Administrator, Civil Service (Medical): No    Lack of Transportation (Non-Medical): No  Physical Activity: Not on file  Stress: Not on file  Social Connections: Not on file    Family History  Problem Relation Age of Onset   Hyperlipidemia Mother    Heart disease Mother        has PPM   Diabetes Mother    Hypertension Mother    Heart disease Maternal Grandmother    Prostate cancer Maternal Grandfather    Colon cancer Neg Hx    Colon polyps Neg Hx     Outpatient Encounter Medications as of 04/16/2023  Medication Sig   acetaminophen (TYLENOL) 325 MG tablet Take 1-2 tablets  (325-650 mg total) by mouth every 4 (four) hours as needed for mild pain.   atorvastatin (LIPITOR) 20 MG tablet Take 1 tablet (20 mg total) by mouth daily.   Calcium Carb-Cholecalciferol 600-10 MG-MCG TABS Take 1 tablet by mouth daily with breakfast.   Continuous Glucose Receiver (FREESTYLE LIBRE 3 READER) DEVI 1 each by Does not apply route daily.   Continuous Glucose Sensor (FREESTYLE LIBRE 3 SENSOR) MISC PLACE 1 SENSOR ON THE SKIN EVERY 14 DAYS   digoxin (LANOXIN) 0.125 MG tablet Take 0.5 tablets (0.0625 mg total) by mouth daily.   empagliflozin (JARDIANCE) 10 MG TABS tablet Take 1 tablet (10 mg total) by mouth daily.   enoxaparin (LOVENOX) 40 MG/0.4ML injection Inject 0.4 mLs (40 mg total) into the skin every 12 (twelve) hours. (Patient not taking: Reported on 04/05/2023)   Fe Fum-Vit C-Vit B12-FA (TRIGELS-F FORTE) CAPS capsule Take 1 capsule by mouth daily after breakfast.   insulin glargine (SEMGLEE, YFGN,) 100 UNIT/ML Solostar Pen Inject 30 Units into the skin at bedtime.   insulin lispro (HUMALOG KWIKPEN) 100 UNIT/ML KwikPen Inject 6-12 Units into the skin 3 (three) times daily before meals. Use 4 units three times a day with meals   Insulin Pen Needle (PEN NEEDLES) 32G X 4 MM MISC Use with insulin pen with breakfast, with lunch, and with evening meal.   levothyroxine (SYNTHROID) 25 MCG tablet Take 1 tablet (25 mcg total) by mouth daily before breakfast.   magnesium oxide (MAG-OX) 400 (240 Mg) MG tablet Take 1 tablet (400 mg total) by mouth 2 (two) times daily.   Multiple Vitamin (MULTIVITAMIN WITH MINERALS) TABS tablet Take 1 tablet by mouth daily.   pantoprazole (PROTONIX) 40 MG tablet Take 1 tablet (40 mg total) by mouth daily.   potassium chloride SA (KLOR-CON M) 20 MEQ tablet Take 2 tablets (40 mEq total) by mouth daily.   sertraline (ZOLOFT) 50 MG tablet Take 1 tablet (50 mg total) by mouth daily.   sildenafil (REVATIO) 20 MG tablet Take 1 tablet (20 mg total) by mouth 3 (three) times  daily.   spironolactone (ALDACTONE) 25 MG tablet Take 1 tablet (25 mg total) by mouth daily.   tiotropium (SPIRIVA HANDIHALER) 18 MCG inhalation capsule Place 1 capsule (18 mcg total) into inhaler and inhale daily.   torsemide (DEMADEX) 20  MG tablet Take 2 tablets (40 mg total) by mouth every morning AND 3 tablets (60 mg total) every evening. Take 3 tablets (60 mg) daily.   warfarin (COUMADIN) 3 MG tablet Take 6 mg (2 tablets) every Tuesday/Thursday/Saturday and 3 mg (1 tablet) all other days or as directed by heart failure clinic   [DISCONTINUED] insulin lispro (HUMALOG KWIKPEN) 100 UNIT/ML KwikPen Inject 8-14 Units into the skin 3 (three) times daily before meals. Use 4 units three times a day with meals   No facility-administered encounter medications on file as of 04/16/2023.    ALLERGIES: No Known Allergies  VACCINATION STATUS: Immunization History  Administered Date(s) Administered   Influenza,inj,Quad PF,6+ Mos 07/24/2022   Moderna Sars-Covid-2 Vaccination 11/25/2019, 12/23/2019   PNEUMOCOCCAL CONJUGATE-20 07/24/2022    Diabetes He presents for his follow-up diabetic visit. He has type 2 diabetes mellitus. Onset time: He was diagnosed at approximate age of 33. His disease course has been improving. There are no hypoglycemic associated symptoms. Pertinent negatives for hypoglycemia include no confusion, headaches, pallor or seizures. Pertinent negatives for diabetes include no chest pain, no fatigue, no polydipsia, no polyphagia, no polyuria and no weakness. There are no hypoglycemic complications. Symptoms are improving. Diabetic complications include heart disease, impotence and nephropathy. (His diabetes is complicated by CHF with ejection fraction of 20-25% status post permanent cardiac pacemaker, coronary artery disease, comorbid hypertension/hyper lipidemia.) Risk factors for coronary artery disease include dyslipidemia, diabetes mellitus, male sex, hypertension, tobacco exposure and  sedentary lifestyle. His weight is decreasing steadily. He is following a generally unhealthy diet. When asked about meal planning, he reported none. He has not had a previous visit with a dietitian. He never participates in exercise. His home blood glucose trend is decreasing steadily. His breakfast blood glucose range is generally 140-180 mg/dl. His lunch blood glucose range is generally 140-180 mg/dl. His dinner blood glucose range is generally 140-180 mg/dl. His bedtime blood glucose range is generally 140-180 mg/dl. His overall blood glucose range is 140-180 mg/dl. (He has a freestyle libre 3 sensor, presents with TIR of 86%, 14% Libre 1 hyperglycemic.  No hypoglycemia.  His average blood glucose is 148 for the last 14 days.  This is despite his recent point-of-care A1c of 11.1%.  ) An ACE inhibitor/angiotensin II receptor blocker is not being taken.  Hyperlipidemia Exacerbating diseases include chronic renal disease. Pertinent negatives include no chest pain, myalgias or shortness of breath. Risk factors for coronary artery disease include diabetes mellitus, dyslipidemia and male sex.  Hypertension This is a chronic problem. The problem is controlled. Pertinent negatives include no chest pain, headaches, neck pain, palpitations or shortness of breath. Risk factors for coronary artery disease include dyslipidemia, diabetes mellitus, male gender, sedentary lifestyle and smoking/tobacco exposure. Past treatments include diuretics. Hypertensive end-organ damage includes kidney disease, CAD/MI and heart failure. Identifiable causes of hypertension include chronic renal disease.     Review of Systems  Constitutional:  Negative for chills, fatigue, fever and unexpected weight change.  HENT:  Negative for dental problem, mouth sores and trouble swallowing.   Eyes:  Negative for visual disturbance.  Respiratory:  Negative for cough, choking, chest tightness, shortness of breath and wheezing.    Cardiovascular:  Negative for chest pain, palpitations and leg swelling.  Gastrointestinal:  Negative for abdominal distention, abdominal pain, constipation, diarrhea, nausea and vomiting.  Endocrine: Negative for polydipsia, polyphagia and polyuria.  Genitourinary:  Positive for impotence. Negative for dysuria, flank pain, hematuria and urgency.  Musculoskeletal:  Negative for  back pain, gait problem, myalgias and neck pain.  Skin:  Negative for pallor, rash and wound.  Neurological:  Negative for seizures, syncope, weakness, numbness and headaches.  Psychiatric/Behavioral:  Negative for confusion and dysphoric mood.     Objective:       04/16/2023   10:09 AM 04/05/2023    1:27 PM 04/04/2023    3:17 PM  Vitals with BMI  Height 5\' 11"  5\' 11"  5\' 11"   Weight 204 lbs 6 oz 209 lbs 3 oz 210 lbs  BMI 28.52 29.19 29.3  Systolic   92  Diastolic   0  Pulse   89    Ht 5\' 11"  (1.803 m)   Wt 204 lb 6.4 oz (92.7 kg)   BMI 28.51 kg/m   Wt Readings from Last 3 Encounters:  04/16/23 204 lb 6.4 oz (92.7 kg)  04/05/23 209 lb 3.2 oz (94.9 kg)  04/04/23 210 lb (95.3 kg)     Physical Exam Constitutional:      General: He is not in acute distress.    Appearance: He is well-developed.  HENT:     Head: Normocephalic and atraumatic.  Neck:     Thyroid: No thyromegaly.     Trachea: No tracheal deviation.  Cardiovascular:     Rate and Rhythm: Normal rate.     Pulses:          Dorsalis pedis pulses are 1+ on the right side and 1+ on the left side.       Posterior tibial pulses are 1+ on the right side and 1+ on the left side.     Heart sounds: S1 normal and S2 normal. Murmur heard.     No gallop.     Comments: Patient with CHF ejection fraction 20-25%, with permanent pacemaker. Pulmonary:     Effort: No respiratory distress.     Breath sounds: Normal breath sounds. No wheezing.  Abdominal:     General: Bowel sounds are normal. There is no distension.     Palpations: Abdomen is soft.      Tenderness: There is no abdominal tenderness. There is no guarding.  Musculoskeletal:     Right shoulder: No swelling or deformity.     Cervical back: Normal range of motion and neck supple.  Skin:    General: Skin is warm and dry.     Findings: No rash.     Nails: There is no clubbing.  Neurological:     Mental Status: He is alert and oriented to person, place, and time.     Cranial Nerves: No cranial nerve deficit.     Sensory: No sensory deficit.     Gait: Gait normal.     Deep Tendon Reflexes: Reflexes are normal and symmetric.  Psychiatric:        Speech: Speech normal.        Behavior: Behavior normal. Behavior is cooperative.        Thought Content: Thought content normal.        Judgment: Judgment normal.     CMP ( most recent) CMP     Component Value Date/Time   NA 131 (L) 03/12/2023 1011   NA 135 11/23/2021 0814   K 4.0 03/12/2023 1011   CL 92 (L) 03/12/2023 1011   CO2 26 03/12/2023 1011   GLUCOSE 182 (H) 03/12/2023 1011   BUN 34 (H) 03/12/2023 1011   BUN 20 11/23/2021 0814   CREATININE 1.39 (H) 03/12/2023 1011   CALCIUM 8.9 03/12/2023  1011   PROT 8.7 (H) 01/01/2023 1004   ALBUMIN 4.0 01/01/2023 1004   AST 21 01/01/2023 1004   ALT 25 01/01/2023 1004   ALKPHOS 163 (H) 01/01/2023 1004   BILITOT 0.8 01/01/2023 1004   EGFR 58 (L) 11/23/2021 0814   GFRNONAA 58 (L) 03/12/2023 1011     Diabetic Labs (most recent): Lab Results  Component Value Date   HGBA1C 11.1 (A) 04/05/2023   HGBA1C 9.3 (H) 06/27/2022   HGBA1C 9.7 (H) 10/15/2021     Lipid Panel ( most recent) Lipid Panel     Component Value Date/Time   CHOL 212 (H) 06/27/2022 1825   TRIG 134 06/27/2022 1825   HDL 55 06/27/2022 1825   CHOLHDL 3.9 06/27/2022 1825   VLDL 27 06/27/2022 1825   LDLCALC 130 (H) 06/27/2022 1825      Lab Results  Component Value Date   TSH 8.560 (H) 10/10/2022   TSH 4.362 08/17/2022   TSH 2.576 06/27/2022   TSH 2.723 10/15/2021   FREET4 1.28 (H) 08/17/2022    FREET4 1.06 06/27/2022      Assessment & Plan:   1. Type 2 diabetes mellitus with other specified complication, with Weisbecker-term current use of insulin (HCC) - Darryl Frye has currently uncontrolled symptomatic type 2 DM since  62 years of age. He has a freestyle libre 3 sensor, presents with TIR of 86%, 14% Libre 1 hyperglycemic.  No hypoglycemia.  His average blood glucose is 148 for the last 14 days.  This is despite his recent point-of-care A1c of 11.1%.    Recent labs reviewed. - I had a [place] discussion with him about the possible risk factors and  the pathology behind its diabetes and its complications. -his diabetes is complicated by  by CHF with ejection fraction of 20-25% status post permanent cardiac pacemaker, coronary artery disease, comorbid hypertension/hyper lipidemia and he remains at exceedingly high risk for more acute and chronic complications which include CAD, CVA, CKD, retinopathy, and neuropathy. These are all discussed in detail with him.  - I discussed all available options of managing his diabetes including de-escalation of medications. I have counseled him on Food as Medicine by adopting a Whole Food , Plant Predominant  ( WFPP) nutrition as recommended by [employer] of Lifestyle Medicine. Patient is encouraged to switch to  unprocessed or minimally processed  complex starch, adequate protein intake (mainly plant source), minimal liquid fat, plenty of fruits, and vegetables. -  he is advised to stick to a routine mealtimes to eat 3 complete meals a day and snack only when necessary ( to snack only to correct hypoglycemia BG <70 day time or <100 at night).   - he acknowledges that there is a room for improvement in his food and drink choices. - Suggestion is made for him to avoid simple carbohydrates  from his diet including Cakes, Sweet Desserts, Ice Cream, Soda (diet and regular), Sweet Tea, Candies, Chips, Cookies, Store Bought Juices, Alcohol , Artificial  Sweeteners,  Coffee Creamer, and "Sugar-free" Products, Lemonade. This will help patient to have more stable blood glucose profile and potentially avoid unintended weight gain.  The following Lifestyle Medicine recommendations according to American College of Lifestyle Medicine  [facility]) were discussed and and offered to patient and he  agrees to start the journey:  A. Whole Foods, Plant-Based Nutrition comprising of fruits and vegetables, plant-based proteins, whole-grain carbohydrates was discussed in detail with the patient.   A list for source of those  nutrients were also provided to the patient.  Patient will use only water or unsweetened tea for hydration. B.  The need to stay away from risky substances including alcohol, smoking; obtaining 7 to 9 hours of restorative sleep, at least 150 minutes of moderate intensity exercise weekly, the importance of healthy social connections,  and stress management techniques were discussed. C.  A full color page of  Calorie density of various food groups per pound showing examples of each food groups was provided to the patient.   - he will be scheduled with Norm Salt, RDN, CDE for individualized diabetes education.  - I have approached him with the following individualized plan to manage  his diabetes and patient agrees:   -In light of his presentation with presentation with improved glycemic profile, he would benefit from de-escalation his medications especially insulin.   -He is advised to continue Semglee at 30 units nightly, lower Humalog to 6-14   units 3 times a day with meals  for pre-meal BG readings of 90-150mg /dl, plus patient specific correction dose for unexpected hyperglycemia above 150mg /dl, advised to resume and continue his CGM device.    - he is warned not to take insulin without proper monitoring per orders. - Adjustment parameters are given to him for hypo and hyperglycemia in writing. - he is encouraged to call clinic for blood  glucose levels less than 70 or above 300 mg /dl. -He will continue to benefit from low-dose SGLT2 inhibitors.  I advised him to continue Jardiance 10 mg p.o. daily at breakfast.    - he will be considered for incretin therapy as appropriate next visit.  - Specific targets for  A1c;  LDL, HDL,  and Triglycerides were discussed with the patient.  2) Blood Pressure /Hypertension:     he is advised to continue his current medications including spironolactone 25 mg p.o. daily at breakfast.  He is also on Lasix 40 mg p.o. daily in the morning and 60 mg p.o. daily in the evening.  3) Lipids/Hyperlipidemia:   Review of his recent lipid panel showed uncontrolled  LDL at 130 .  he  is advised to continue Lipitor 20 mg p.o. daily at bedtime. Side effects and precautions discussed with him.  4)  Weight/Diet:  Body mass index is 28.51 kg/m.  -   clearly complicating his diabetes care.   he is  a candidate for weight loss. I discussed with him the fact that loss of 5 - 10% of his  current body weight will have the most impact on his diabetes management.  The above detailed  ACLM recommendations for nutrition, exercise, sleep, social life, avoidance of risky substances, the need for restorative sleep   information will also detailed on discharge instructions.  5) hypothyroidism: The circumstance of his diagnosis are not available today.  His advised to continue levothyroxine 25 mcg p.o. daily before breakfast.     - We discussed about the correct intake of his thyroid hormone, on empty stomach at fasting, with water, separated by at least 30 minutes from breakfast and other medications,  and separated by more than 4 hours from calcium, iron, multivitamins, acid reflux medications (PPIs). -Patient is made aware of the fact that thyroid hormone replacement is needed for life, dose to be adjusted by periodic monitoring of thyroid function tests.    6) Chronic Care/Health Maintenance:  -he  10 on Statin  medications and  is encouraged to initiate and continue to follow up with Ophthalmology, Dentist,  Podiatrist at least yearly or according to recommendations, and advised to   stay away from smoking. I have recommended yearly flu vaccine and pneumonia vaccine at least every 5 years; moderate intensity exercise for up to 150 minutes weekly; and  sleep for 7- 9 hours a day.  - he is  advised to maintain close follow up with Marylynn Pearson, FNP for primary care needs, as well as his other providers for optimal and coordinated care.   I spent  42  minutes in the care of the patient today including review of labs from CMP, Lipids, Thyroid Function, Hematology (current and previous including abstractions from other facilities); face-to-face time discussing  his blood glucose readings/logs, discussing hypoglycemia and hyperglycemia episodes and symptoms, medications doses, his options of short and [place] term treatment based on the latest standards of care / guidelines;  discussion about incorporating lifestyle medicine;  and documenting the encounter. Risk reduction counseling performed per USPSTF guidelines to reduce  cardiovascular risk factors.     Please refer to Patient Instructions for Blood Glucose Monitoring and Insulin/Medications Dosing Guide"  in media tab for additional information. Please  also refer to " Patient Self Inventory" in the Media  tab for reviewed elements of pertinent patient history.  Sheppard Plumber Kinlaw participated in the discussions, expressed understanding, and voiced agreement with the above plans.  All questions were answered to his satisfaction. he is encouraged to contact clinic should he have any questions or concerns prior to his return visit.   Follow up plan: - Return in about 3 months (around 07/17/2023) for Bring Meter/CGM Device/Logs- A1c in Office.  Marquis Lunch, MD [facility] [address] [place], [place]  [address] Phone: [phone]  Fax: [phone]    04/16/2023, 12:50 PM  This note was partially dictated with voice recognition software. Similar sounding words can be transcribed inadequately or may not  be corrected upon review.

## 2023-04-16 NOTE — Patient Instructions (Signed)
                                     Advice for Weight Management  -For most of us the best way to lose weight is by diet management. Generally speaking, diet management means consuming less calories intentionally which over time brings about progressive weight loss.  This can be achieved more effectively by avoiding ultra processed carbohydrates, processed meats, unhealthy fats.    It is critically important to know your numbers: how much calorie you are consuming and how much calorie you need. More importantly, our carbohydrates sources should be unprocessed naturally occurring  complex starch food items.  It is always important to balance nutrition also by  appropriate intake of proteins (mainly plant-based), healthy fats/oils, plenty of fruits and vegetables.   -The American College of Lifestyle Medicine (ACL M) recommends nutrition derived mostly from Whole Food, Plant Predominant Sources example an apple instead of applesauce or apple pie. Eat Plenty of vegetables, Mushrooms, fruits, Legumes, Whole Grains, Nuts, seeds in lieu of processed meats, processed snacks/pastries red meat, poultry, eggs.  Use only water or unsweetened tea for hydration.  The College also recommends the need to stay away from risky substances including alcohol, smoking; obtaining 7-9 hours of restorative sleep, at least 150 minutes of moderate intensity exercise weekly, importance of healthy social connections, and being mindful of stress and seek help when it is overwhelming.    -Sticking to a routine mealtime to eat 3 meals a day and avoiding unnecessary snacks is shown to have a big role in weight control. Under normal circumstances, the only time we burn stored energy is when we are hungry, so allow  some hunger to take place- hunger means no food between appropriate meal times, only water.  It is not advisable to starve.   -It is better to avoid simple carbohydrates including:  Cakes, Sweet Desserts, Ice Cream, Soda (diet and regular), Sweet Tea, Candies, Chips, Cookies, Store Bought Juices, Alcohol in Excess of  1-2 drinks a day, Lemonade,  Artificial Sweeteners, Doughnuts, Coffee Creamers, "Sugar-free" Products, etc, etc.  This is not a complete list.....    -Consulting with certified diabetes educators is proven to provide you with the most accurate and current information on diet.  Also, you may be  interested in discussing diet options/exchanges , we can schedule a visit with Darryl Frye, RDN, CDE for individualized nutrition education.  -Exercise: If you are able: 30 -60 minutes a day ,4 days a week, or 150 minutes of moderate intensity exercise weekly.    The longer the better if tolerated.  Combine stretch, strength, and aerobic activities.  If you were told in the past that you have high risk for cardiovascular diseases, or if you are currently symptomatic, you may seek evaluation by your heart doctor prior to initiating moderate to intense exercise programs.                                  Additional Care Considerations for Diabetes/Prediabetes   -Diabetes  is a chronic disease.  The most important care consideration is regular follow-up with your diabetes care provider with the goal being avoiding or delaying its complications and to take advantage of advances in medications and technology.  If appropriate actions are taken early enough, type 2 diabetes can even be   reversed.  Seek information from the right source.  - Whole Food, Plant Predominant Nutrition is highly recommended: Eat Plenty of vegetables, Mushrooms, fruits, Legumes, Whole Grains, Nuts, seeds in lieu of processed meats, processed snacks/pastries red meat, poultry, eggs as recommended by American College of  Lifestyle Medicine (ACLM).  -Type 2 diabetes is known to coexist with other important comorbidities such as high blood pressure and high cholesterol.  It is critical to control not only the  diabetes but also the high blood pressure and high cholesterol to minimize and delay the risk of complications including coronary artery disease, stroke, amputations, blindness, etc.  The good news is that this diet recommendation for type 2 diabetes is also very helpful for managing high cholesterol and high blood blood pressure.  - Studies showed that people with diabetes will benefit from a class of medications known as ACE inhibitors and statins.  Unless there are specific reasons not to be on these medications, the standard of care is to consider getting one from these groups of medications at an optimal doses.  These medications are generally considered safe and proven to help protect the heart and the kidneys.    - People with diabetes are encouraged to initiate and maintain regular follow-up with eye doctors, foot doctors, dentists , and if necessary heart and kidney doctors.     - It is highly recommended that people with diabetes quit smoking or stay away from smoking, and get yearly  flu vaccine and pneumonia vaccine at least every 5 years.  See above for additional recommendations on exercise, sleep, stress management , and healthy social connections.      

## 2023-04-20 ENCOUNTER — Other Ambulatory Visit (HOSPITAL_COMMUNITY): Payer: Self-pay | Admitting: *Deleted

## 2023-04-20 DIAGNOSIS — Z95811 Presence of heart assist device: Secondary | ICD-10-CM

## 2023-04-20 DIAGNOSIS — Z7901 Long term (current) use of anticoagulants: Secondary | ICD-10-CM

## 2023-04-26 ENCOUNTER — Other Ambulatory Visit (HOSPITAL_COMMUNITY)
Admission: RE | Admit: 2023-04-26 | Discharge: 2023-04-26 | Disposition: A | Discharge status: Home / Self Care | Payer: 59 | Source: Ambulatory Visit | Attending: Cardiology | Admitting: Cardiology

## 2023-04-26 ENCOUNTER — Ambulatory Visit (HOSPITAL_COMMUNITY): Payer: Self-pay | Admitting: Pharmacist

## 2023-04-26 DIAGNOSIS — Z95811 Presence of heart assist device: Secondary | ICD-10-CM | POA: Diagnosis present

## 2023-04-26 DIAGNOSIS — Z7901 Long term (current) use of anticoagulants: Secondary | ICD-10-CM | POA: Diagnosis present

## 2023-04-26 LAB — PROTIME-INR
INR: 2.7 — ABNORMAL HIGH (ref 0.8–1.2)
Prothrombin Time: 28.8 s — ABNORMAL HIGH (ref 11.4–15.2)

## 2023-05-07 ENCOUNTER — Ambulatory Visit (INDEPENDENT_AMBULATORY_CARE_PROVIDER_SITE_OTHER): Payer: 59

## 2023-05-07 DIAGNOSIS — I509 Heart failure, unspecified: Secondary | ICD-10-CM

## 2023-05-08 LAB — CUP PACEART REMOTE DEVICE CHECK
Battery Remaining Longevity: 90 mo
Battery Voltage: 2.99 V
Brady Statistic AP VP Percent: 0.01 %
Brady Statistic AP VS Percent: 3.83 %
Brady Statistic AS VP Percent: 0.02 %
Brady Statistic AS VS Percent: 96.14 %
Brady Statistic RA Percent Paced: 3.83 %
Brady Statistic RV Percent Paced: 0.03 %
Date Time Interrogation Session: 20240915073824
HighPow Impedance: 74 Ohm
Implantable Lead Connection Status: 753985
Implantable Lead Implant Date: 20210929
Implantable Lead Location: 753860
Implantable Pulse Generator Implant Date: 20210929
Lead Channel Impedance Value: 342 Ohm
Lead Channel Impedance Value: 418 Ohm
Lead Channel Impedance Value: 418 Ohm
Lead Channel Pacing Threshold Amplitude: 0.625 V
Lead Channel Pacing Threshold Amplitude: 2.5 V
Lead Channel Pacing Threshold Pulse Width: 0.4 ms
Lead Channel Pacing Threshold Pulse Width: 0.4 ms
Lead Channel Sensing Intrinsic Amplitude: 4.125 mV
Lead Channel Sensing Intrinsic Amplitude: 4.125 mV
Lead Channel Sensing Intrinsic Amplitude: 5.125 mV
Lead Channel Sensing Intrinsic Amplitude: 5.125 mV
Lead Channel Setting Pacing Amplitude: 1.5 V
Lead Channel Setting Pacing Amplitude: 3 V
Lead Channel Setting Pacing Pulse Width: 1 ms
Lead Channel Setting Sensing Sensitivity: 0.3 mV
Zone Setting Status: 755011
Zone Setting Status: 755011

## 2023-05-09 ENCOUNTER — Other Ambulatory Visit (HOSPITAL_COMMUNITY): Payer: Self-pay | Admitting: *Deleted

## 2023-05-09 DIAGNOSIS — Z95811 Presence of heart assist device: Secondary | ICD-10-CM

## 2023-05-09 DIAGNOSIS — I509 Heart failure, unspecified: Secondary | ICD-10-CM

## 2023-05-09 DIAGNOSIS — Z7901 Long term (current) use of anticoagulants: Secondary | ICD-10-CM

## 2023-05-14 ENCOUNTER — Ambulatory Visit (HOSPITAL_COMMUNITY): Payer: Self-pay | Admitting: Pharmacist

## 2023-05-14 ENCOUNTER — Ambulatory Visit (HOSPITAL_COMMUNITY)
Admission: RE | Admit: 2023-05-14 | Discharge: 2023-05-14 | Disposition: A | Discharge status: Home / Self Care | Payer: 59 | Source: Ambulatory Visit | Attending: Cardiology | Admitting: Cardiology

## 2023-05-14 DIAGNOSIS — Z95811 Presence of heart assist device: Secondary | ICD-10-CM | POA: Diagnosis not present

## 2023-05-14 DIAGNOSIS — Z7984 Long term (current) use of oral hypoglycemic drugs: Secondary | ICD-10-CM | POA: Insufficient documentation

## 2023-05-14 DIAGNOSIS — I5022 Chronic systolic (congestive) heart failure: Secondary | ICD-10-CM | POA: Diagnosis present

## 2023-05-14 DIAGNOSIS — I4891 Unspecified atrial fibrillation: Secondary | ICD-10-CM | POA: Insufficient documentation

## 2023-05-14 DIAGNOSIS — I272 Pulmonary hypertension, unspecified: Secondary | ICD-10-CM | POA: Insufficient documentation

## 2023-05-14 DIAGNOSIS — I428 Other cardiomyopathies: Secondary | ICD-10-CM | POA: Insufficient documentation

## 2023-05-14 DIAGNOSIS — Z79890 Hormone replacement therapy (postmenopausal): Secondary | ICD-10-CM | POA: Diagnosis not present

## 2023-05-14 DIAGNOSIS — I4892 Unspecified atrial flutter: Secondary | ICD-10-CM | POA: Insufficient documentation

## 2023-05-14 DIAGNOSIS — D649 Anemia, unspecified: Secondary | ICD-10-CM | POA: Insufficient documentation

## 2023-05-14 DIAGNOSIS — E1122 Type 2 diabetes mellitus with diabetic chronic kidney disease: Secondary | ICD-10-CM | POA: Insufficient documentation

## 2023-05-14 DIAGNOSIS — J449 Chronic obstructive pulmonary disease, unspecified: Secondary | ICD-10-CM | POA: Diagnosis not present

## 2023-05-14 DIAGNOSIS — Z86718 Personal history of other venous thrombosis and embolism: Secondary | ICD-10-CM | POA: Insufficient documentation

## 2023-05-14 DIAGNOSIS — E039 Hypothyroidism, unspecified: Secondary | ICD-10-CM | POA: Insufficient documentation

## 2023-05-14 DIAGNOSIS — Z7901 Long term (current) use of anticoagulants: Secondary | ICD-10-CM | POA: Diagnosis not present

## 2023-05-14 DIAGNOSIS — N183 Chronic kidney disease, stage 3 unspecified: Secondary | ICD-10-CM | POA: Insufficient documentation

## 2023-05-14 DIAGNOSIS — I509 Heart failure, unspecified: Secondary | ICD-10-CM

## 2023-05-14 DIAGNOSIS — Z79899 Other long term (current) drug therapy: Secondary | ICD-10-CM | POA: Diagnosis not present

## 2023-05-14 LAB — CBC
HCT: 44.3 % (ref 39.0–52.0)
Hemoglobin: 13.2 g/dL (ref 13.0–17.0)
MCH: 20.8 pg — ABNORMAL LOW (ref 26.0–34.0)
MCHC: 29.8 g/dL — ABNORMAL LOW (ref 30.0–36.0)
MCV: 69.8 fL — ABNORMAL LOW (ref 80.0–100.0)
Platelets: 406 10*3/uL — ABNORMAL HIGH (ref 150–400)
RBC: 6.35 MIL/uL — ABNORMAL HIGH (ref 4.22–5.81)
RDW: 22.8 % — ABNORMAL HIGH (ref 11.5–15.5)
WBC: 7.5 10*3/uL (ref 4.0–10.5)
nRBC: 0 % (ref 0.0–0.2)

## 2023-05-14 LAB — BASIC METABOLIC PANEL
Anion gap: 13 (ref 5–15)
BUN: 28 mg/dL — ABNORMAL HIGH (ref 6–20)
CO2: 27 mmol/L (ref 22–32)
Calcium: 9.6 mg/dL (ref 8.9–10.3)
Chloride: 97 mmol/L — ABNORMAL LOW (ref 98–111)
Creatinine, Ser: 1.28 mg/dL — ABNORMAL HIGH (ref 0.61–1.24)
GFR, Estimated: >60 mL/min (ref >60)
Glucose, Bld: 88 mg/dL (ref 70–99)
Potassium: 4 mmol/L (ref 3.5–5.1)
Sodium: 137 mmol/L (ref 135–145)

## 2023-05-14 LAB — PROTIME-INR
INR: 2.8 — ABNORMAL HIGH (ref 0.8–1.2)
Prothrombin Time: 29.5 seconds — ABNORMAL HIGH (ref 11.4–15.2)

## 2023-05-14 LAB — LACTATE DEHYDROGENASE: LDH: 187 U/L (ref 98–192)

## 2023-05-14 LAB — MAGNESIUM: Magnesium: 2.1 mg/dL (ref 1.7–2.4)

## 2023-05-14 LAB — DIGOXIN LEVEL: Digoxin Level: 0.6 ng/mL — ABNORMAL LOW (ref 0.8–2.0)

## 2023-05-14 MED ORDER — SPIRONOLACTONE 25 MG PO TABS: 12.5000 mg | ORAL_TABLET | Freq: Every day | ORAL | 3 refills | Status: DC

## 2023-05-14 NOTE — Patient Instructions (Addendum)
  Decrease Spiro to 12.5 mg (1/2 tablet) once daily. TAKE AT BEDTIME. Labs today - we will call you if abnormal.  Continue weekly dressing changes. You may start showering - provided with demonstration of shower bag and written instructions/illistrations. May cover with large tegaderm. Provided with 2 packs of large tegaderms. Return to [facility] in 6 weeks. Hold Digoxin that morning. Bring your home equipment for annual maintenance. Be prepared to do a 6 minute walk. Call us if any issues prior to your next visit.  We will check on getting you a home INR machine.

## 2023-05-14 NOTE — Progress Notes (Addendum)
Pt presents for 2 month f/u in VAD clinic today alone. Denies issues with VAD equipment or drive line.   Pt walked into clinic independently. States he is overall feeling well. Denies falls, shortness of breath, and signs of bleeding. Reports intermittent lightheadedness/dizziness when he "gets out of car" but this resolves within "a minute". See orthostatics below.  He is drinking @ 2 liters of fluid daily. Dr. Shirlee Latch decreased Cleda Daub to 12.5 mg daily and instructed patient to take this at bedtime.    Per Dr Shirlee Latch pt will continue to take Torsemide 40 mg in AM 60 mg in PM. Pt reports very good urine output with this dose.   Pt is working 3 hrs/day doing custodial work.   Pt reports intermittent itching at exit site. Reminded him to rinse with saline wipe after cleansing with CHG. He verbalized understanding. Pt also reports he has been getting in tub, but sitting on stool and changing dressing if he gets it wet. Dressing changed today - see below.   Pt is interested in getting home INR machine if his insurance will pay. Patient enrollment form completed and sent to [employer]. Informed patient they will be calling him to discuss his options. Pt verbalized understanding of same.    Vital Signs:  Sitting:  Standing: Doppler: 86     96 Automatic BP: 86/56 (65)   UTO HR: 88 NSR    103 SpO2: 97% on RA  Destination Therapy due to uncontrolled diabetes   LVAD assessment: HM III:     Standing:  VAD Speed: 5400 rpms Flow: 4.9    4.6 Power: 4.1 w      4.1 PI: 2.9     3.1 Hct: 30   Alarms: none Events: 0-5 PI events daily  Fixed speed: 5400 Low speed limit: 5100  Primary controller: back up battery due for replacement in 17 months Secondary controller: pt did not bring; "left in car"  I reviewed the LVAD parameters from today and compared the results to the patient's prior recorded data. LVAD interrogation was NEGATIVE for significant power changes, NEGATIVE for clinical alarms and STABLE for PI  events/speed drops. No programming changes were made and pump is functioning within specified parameters. Pt is performing daily controller and system monitor self tests along with completing weekly and monthly maintenance for LVAD equipment.   LVAD equipment check completed and is in good working order. Back-up equipment not present today.   Annual Equipment Maintenance on UBC/PM was performed on 06/2022.     Exit Site Care: Existing VAD dressing removed and site care performed using sterile technique. Drive line exit site cleaned with Chlora prep applicators x 2, rinsed with saline, allowed to dry, and Sorbaview dressing with Silverlon patch applied. Exit site healed and incorporated , velour is fully implanted at exit site. No redness, tenderness, drainage, foul odor or rash noted. Drive line anchor re-applied. Covered all with 2 large tegaderms in preparation for shower later this evening. Pt denies fever or chills. Pt given 8 weekly kits for home use along with 2 boxes of large tegaderms. Shower bag provided demonstration, verbal, and written instructions - see below.    Significant Events on VAD Support:  none   Device: N/A    BP & Labs:  Doppler 86 - Doppler is reflecting modified systolic   Hgb 13.2 - No S/S of bleeding. Specifically denies melena/BRBPR or nosebleeds.   LDH stable at 187 - Denies tea-colored urine. No power elevations noted on interrogation.  Patient Instructions: Decrease Spiro to 12.5 mg (1/2 tablet) once daily. TAKE AT BEDTIME. Labs today - we will call you if abnormal.  Continue weekly dressing changes. You may start showering - provided with demonstration of shower bag and written instructions/illistrations. May cover with large tegaderm. Provided with 2 packs of large tegaderms. Return to VAD Clinic in 6 weeks. Hold Digoxin that morning. Bring your home equipment for annual maintenance. Be prepared to do a 6 minute walk. Call us if any issues prior to your  next visit.  We will check on getting you a home INR machine.   Provided patient with shower bag for home use. Demonstration along with written instructions and illustrated steps provided.  Patient and caregiver verbalized understanding of same.    Here are some tips for washing:  Avoid getting the driveline exit site dressing wet, and consider planning bathing times around exit site dressing changes. Use only the shower bag we gave you to shower with and don't get creative with your equipment to shower. Water and electricity do not mix and will cause your pump to stop.   Sit on a chair or bathing stool in the bathtub or shower stall, and use a basin of warm water and washcloth or sponge to wash. Or, wash at the sink while standing on a towel, so as not to get the floor or bath rug wet. To wash your hair, try using a hand-held shower wand or sprayer while standing over the kitchen sink or bathtub. Be sure that all floor surfaces are dry when walking around after bathing, to avoid slipping. Do not use powder around the exit site dressing. Anytime your dressing appears wet CHANGE IT! Drink 2 large glasses of water prior to getting in shower for first time (always hydrate before shower). Caregiver needs to be accessible during first few showers. Call VAD Coordinator if any changes in appearance of drive line site.     Hessie Diener RN VAD Coordinator  Office: [phone]  24/7 Pager: [phone]     ADVANCED HF/VAD CLINIC NOTE   Mr Pynn is a 62 y.o. with history of atrial fibrillation/flutter and nonischemic cardiomyopathy who was initially referred by Dr. Wyline Mood for evaluation of CHF.  Patient was diagnosed with CHF in 2020 at [facility] in [place].  At the time, he drank heavily and used cocaine.  He stopped drinking, smoking, and cocaine in 2020 and has not used any of these substances since then. Echo in 11/20 showed EF < 10%, cath in 12/20 showed normal coronaries.  Echo has shown  persistently low EF over time, echo in 2/23 showed EF 20-25%.  He has a Medtronic ICD.      Patient was admitted in 2/23 with atrial flutter/RVR. He was started on amiodarone and converted to NSR.  He had atrial flutter ablation in 5/23 and amiodarone was stopped  Echo was done in 11/23 showing elevated filling pressure and markedly low cardiac output.  Patient was admitted, started on milrinone, and underwent HM3 LVAD placement with LAA clipping later in 11/23.  Post-op course complicated by acute hypoxemic respiratory failure post-op and PNA.  Patient went to CIR after acute hospital course.   Ramp echo was in 4/24, at 5500 bpm, the IV septum was shifted mildly to the left with moderate RV dilation and dysfunction, AoV opened rarely.  Speed decreased to 5400 bpm, slight opening AoV every beat, trivial AI, midline IV septum.  Speed left at 5400 bpm.   Patient returns for  LVAD followup. Weight is down 7 lbs. He is short of breath walking up stairs, does fine walking on flat ground.  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 Mild lightheadedness with standing, MAP low at 65 today.   Labs (12/23): K 4.3, creatinine 1.25 => 1.45, Na 128, hgb 8.8 => 9.6, LDH 211, LFTs normal, TSH normal, digoxin 0.8 Labs (1/24): K 3.9, creatinine 1.32, LDH 224, hgb 10.4 Labs (2/24): K 4.5, creatinine 1.33 Labs (4/24): creatinine 1.16, hgb 10.9 => 11 Labs (6/24): K 3.8, creatinine 1.32 Labs (7/24): K 4, creatinine 1.39, hgb 12.3, LDH 219  PMH: 1. Atrial fibrillation/flutter: Mainly flutter.  - 5/23 Atrial flutter ablation.  2. H/o DVT 3. Chronic systolic CHF: Nonischemic cardiomyopathy.  Medtronic ICD.  - Echo (11/20): EF < 10% - Cath (12/20): Normal coronaries.  - Echo (8/22): EF 30%, mildly decreased RV systolic function.  - Echo (2/23): EF 20-25%, normal RV.  - RHC (11/23): mean RA 14, PA 66/53 mean 54, mean PCWP 43, CI 1.62 F/1.31 T - HM3 LVAD with LAA clipping on 07/04/22.  - Ramp echo (2/24):  Speed increased to 5500 rpm.  At this speed, IV septum was midline.  Aortic valve opened 1/3 beats.  Mild RV enlargement with moderate RV dysfunction.  - Ramp echo (3/24): Speed decreased back to 5400 rpm as the IV septum was shifted left.  Midline septum at 5400 rpm.  4. Prior cocaine abuse.  5. Prior heavy ETOH 6. COPD: Moderate obstruction on 11/23 PFTs. No longer smokes.  7. Type 2 diabetes   SH: Patient was a heavy drinker, abused cocaine, and smoked in the past but quit in 2020.  Lives in [place]. He does part-time custodial work.    FH: Mother with PPM, aunt with CHF.    VAD Indication: Destination Therapy due to uncontrolled diabetes   LVAD assessment: HM III: See LVAD nurse's note above.    I reviewed the LVAD parameters from today and compared the results to the patient's prior recorded data. LVAD interrogation was NEGATIVE for significant power changes, NEGATIVE for clinical alarms and STABLE for PI events/speed drops. No programming changes were made and pump is functioning within specified parameters. Pt is performing daily controller and system monitor self tests along with completing weekly and monthly maintenance for LVAD equipment.   LVAD equipment check completed and is in good working order. Back-up equipment present. Charged back up battery and performed self-test on equipment.    Annual Equipment Maintenance on UBC/PM was performed on 06/2022.      ROS: All systems negative except as listed in HPI, PMH and Problem List.  Current Outpatient Medications  Medication Sig Dispense Refill   acetaminophen (TYLENOL) 325 MG tablet Take 1-2 tablets (325-650 mg total) by mouth every 4 (four) hours as needed for mild pain.     atorvastatin (LIPITOR) 20 MG tablet Take 1 tablet (20 mg total) by mouth daily. 90 tablet 3   Calcium Carb-Cholecalciferol 600-10 MG-MCG TABS Take 1 tablet by mouth daily with breakfast. 90 tablet 3   Continuous Glucose Receiver (FREESTYLE LIBRE 3  READER) DEVI 1 each by Does not apply route daily. 2 each 3   Continuous Glucose Sensor (FREESTYLE LIBRE 3 SENSOR) MISC PLACE 1 SENSOR ON THE SKIN EVERY 14 DAYS 2 each 3   digoxin (LANOXIN) 0.125 MG tablet Take 0.5 tablets (0.0625 mg total) by mouth daily. 30 tablet 5   empagliflozin (JARDIANCE) 10 MG TABS tablet Take 1 tablet (10 mg total)  by mouth daily. 30 tablet 6   Fe Fum-Vit C-Vit B12-FA (TRIGELS-F FORTE) CAPS capsule Take 1 capsule by mouth daily after breakfast. 30 capsule 6   insulin glargine (SEMGLEE, YFGN,) 100 UNIT/ML Solostar Pen Inject 30 Units into the skin at bedtime. 15 mL 1   insulin lispro (HUMALOG KWIKPEN) 100 UNIT/ML KwikPen Inject 6-12 Units into the skin 3 (three) times daily before meals. Use 4 units three times a day with meals 30 mL 1   Insulin Pen Needle (PEN NEEDLES) 32G X 4 MM MISC Use with insulin pen with breakfast, with lunch, and with evening meal. 100 each 0   levothyroxine (SYNTHROID) 25 MCG tablet Take 1 tablet (25 mcg total) by mouth daily before breakfast. 90 tablet 3   magnesium oxide (MAG-OX) 400 (240 Mg) MG tablet Take 1 tablet (400 mg total) by mouth 2 (two) times daily. 180 tablet 3   Multiple Vitamin (MULTIVITAMIN WITH MINERALS) TABS tablet Take 1 tablet by mouth daily.     pantoprazole (PROTONIX) 40 MG tablet Take 1 tablet (40 mg total) by mouth daily. 90 tablet 3   potassium chloride SA (KLOR-CON M) 20 MEQ tablet Take 2 tablets (40 mEq total) by mouth daily. 180 tablet 3   sertraline (ZOLOFT) 50 MG tablet Take 1 tablet (50 mg total) by mouth daily. 90 tablet 3   sildenafil (REVATIO) 20 MG tablet Take 1 tablet (20 mg total) by mouth 3 (three) times daily. 90 tablet 6   tiotropium (SPIRIVA HANDIHALER) 18 MCG inhalation capsule Place 1 capsule (18 mcg total) into inhaler and inhale daily. 30 capsule 12   torsemide (DEMADEX) 20 MG tablet Take 2 tablets (40 mg total) by mouth every morning AND 3 tablets (60 mg total) every evening. Take 3 tablets (60 mg)  daily. 150 tablet 6   warfarin (COUMADIN) 3 MG tablet Take 6 mg (2 tablets) every Tuesday/Thursday/Saturday and 3 mg (1 tablet) all other days or as directed by heart failure clinic 150 tablet 3   spironolactone (ALDACTONE) 25 MG tablet Take 0.5 tablets (12.5 mg total) by mouth at bedtime. 45 tablet 3   No current facility-administered medications for this encounter.    Patient has no known allergies.  REVIEW OF SYSTEMS: All systems negative except as listed in HPI, PMH and Problem list.   LVAD INTERROGATION:  See LVAD nurse's note above.   I reviewed the LVAD parameters from today, and compared the results to the patient's prior recorded data.  No programming changes were made.  The LVAD is functioning within specified parameters.  The patient performs LVAD self-test daily.  LVAD interrogation was negative for any significant power changes, alarms or PI events/speed drops.  LVAD equipment check completed and is in good working order.  Back-up equipment present.   LVAD education done on emergency procedures and precautions and reviewed exit site care.    Vitals:   05/14/23 1016 05/14/23 1041  BP: (!) 86/56 (!) 86/0  Pulse: 75   SpO2: 98%   Weight: 92.4 kg (203 lb 9.6 oz)   Height: 5\' 11"  (1.803 m)    Vital Signs:  BP (!) 86/0 Comment: Doppler modified systolic  Pulse 75   Ht 5\' 11"  (1.803 m)   Wt 92.4 kg (203 lb 9.6 oz)   SpO2 98%   BMI 28.40 kg/m  MAP 65 Physical Exam: General: Well appearing this am. NAD.  HEENT: Normal. Neck: Supple, JVP 8-9 cm. Carotids OK.  Cardiac:  Mechanical heart sounds with LVAD  hum present.  Lungs:  CTAB, normal effort.  Abdomen:  NT, ND, no HSM. No bruits or masses. +BS  LVAD exit site: Well-healed and incorporated. Dressing dry and intact. No erythema or drainage. Stabilization device present and accurately applied. Driveline dressing changed daily per sterile technique. Extremities:  Warm and dry. No cyanosis, clubbing, rash, or edema.   Neuro:  Alert & oriented x 3. Cranial nerves grossly intact. Moves all 4 extremities w/o difficulty. Affect pleasant    ASSESSMENT AND PLAN:   1. Chronic systolic CHF: Nonischemic cardiomyopathy, diagnosed 2020.  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  Cath in 12/20 with no significant coronary disease.  Medtronic ICD. Echo in 2/23 showed EF 20-25% with normal RV.  RHC in 11/13 showed markedly elevated filling pressures, primarily pulmonary venous hypertension, low cardiac output, and low PAPI. Echo in 11/23 with EF <20%, mod RV dysfunction.  He was admitted and started on milrinone with improvement in hemodynamics.  We proceeded with HM3 LVAD with LAA clip on 07/04/22.  NYHA class II, weight is down but he still looks mildly volume overloaded.   - Continue torsemide 40 qam/60 qpm.  BMET today.  - Continue Jardiance 10 mg daily.  - Continue sildenafil 20 tid for RV.  - With orthostatic symptoms, decrease spironolactone to 12.5 mg at bedtime.  - Continue digoxin 0.0625 daily, check level today.  2. Atrial fibrillation/atrial flutter: H/o flutter ablation.  Had AF/RVR post-LVAD, went back into NSR.  He is now off amiodarone.   - Continue warfarin.  3. VAD management: s/p HM-3 VAD on 07/04/22. Ramp echo in 4/24, speed decreased to 5400 rpm.  Stable LVAD parameters.  - Now off ASA.  - Continue warfarin, INR goal 2-2.5. 4. H/o DVT: On anticoagulation.  5. CKD stage 3: On Jardiance.  - BMET today.  6. Hypothyroidism - On levothyroxine.  7. Anemia: post-op.  - CBC today.  8. COPD: Moderate on 11/23 PFTs.  Suspect this plays a role in his dyspnea.  - Continue Spiriva.     9. Diabetes: Has an endocrinologist.                                                                                                                                                                                                                   Followup in 6  wks.   Marca Ancona 05/14/2023

## 2023-05-21 NOTE — Progress Notes (Signed)
Remote ICD transmission.   

## 2023-05-25 ENCOUNTER — Other Ambulatory Visit (HOSPITAL_COMMUNITY): Payer: Self-pay | Admitting: Unknown Physician Specialty

## 2023-05-25 DIAGNOSIS — Z7901 Long term (current) use of anticoagulants: Secondary | ICD-10-CM

## 2023-05-25 DIAGNOSIS — Z95811 Presence of heart assist device: Secondary | ICD-10-CM

## 2023-05-29 ENCOUNTER — Encounter: Payer: 59 | Attending: "Endocrinology | Admitting: Nutrition

## 2023-05-29 ENCOUNTER — Other Ambulatory Visit (HOSPITAL_COMMUNITY): Payer: Self-pay

## 2023-05-29 ENCOUNTER — Ambulatory Visit (HOSPITAL_COMMUNITY)
Admission: RE | Admit: 2023-05-29 | Discharge: 2023-05-29 | Disposition: A | Discharge status: Home / Self Care | Payer: 59 | Source: Ambulatory Visit | Attending: Cardiology

## 2023-05-29 ENCOUNTER — Encounter (HOSPITAL_COMMUNITY): Payer: Self-pay

## 2023-05-29 ENCOUNTER — Ambulatory Visit (HOSPITAL_COMMUNITY): Payer: Self-pay | Admitting: Pharmacist

## 2023-05-29 DIAGNOSIS — Z95811 Presence of heart assist device: Secondary | ICD-10-CM | POA: Diagnosis present

## 2023-05-29 DIAGNOSIS — Z4509 Encounter for adjustment and management of other cardiac device: Secondary | ICD-10-CM | POA: Insufficient documentation

## 2023-05-29 DIAGNOSIS — Z794 Long term (current) use of insulin: Secondary | ICD-10-CM | POA: Diagnosis present

## 2023-05-29 DIAGNOSIS — E1100 Type 2 diabetes mellitus with hyperosmolarity without nonketotic hyperglycemic-hyperosmolar coma (NKHHC): Secondary | ICD-10-CM

## 2023-05-29 DIAGNOSIS — Z7901 Long term (current) use of anticoagulants: Secondary | ICD-10-CM

## 2023-05-29 DIAGNOSIS — E1169 Type 2 diabetes mellitus with other specified complication: Secondary | ICD-10-CM | POA: Insufficient documentation

## 2023-05-29 DIAGNOSIS — Z713 Dietary counseling and surveillance: Secondary | ICD-10-CM | POA: Diagnosis not present

## 2023-05-29 DIAGNOSIS — E782 Mixed hyperlipidemia: Secondary | ICD-10-CM

## 2023-05-29 DIAGNOSIS — I509 Heart failure, unspecified: Secondary | ICD-10-CM

## 2023-05-29 LAB — BASIC METABOLIC PANEL
Anion gap: 10 (ref 5–15)
BUN: 28 mg/dL — ABNORMAL HIGH (ref 6–20)
CO2: 27 mmol/L (ref 22–32)
Calcium: 9.1 mg/dL (ref 8.9–10.3)
Chloride: 98 mmol/L (ref 98–111)
Creatinine, Ser: 1.25 mg/dL — ABNORMAL HIGH (ref 0.61–1.24)
GFR, Estimated: >60 mL/min (ref >60)
Glucose, Bld: 138 mg/dL — ABNORMAL HIGH (ref 70–99)
Potassium: 4.6 mmol/L (ref 3.5–5.1)
Sodium: 135 mmol/L (ref 135–145)

## 2023-05-29 LAB — CBC
HCT: 41 % (ref 39.0–52.0)
Hemoglobin: 12.1 g/dL — ABNORMAL LOW (ref 13.0–17.0)
MCH: 20.6 pg — ABNORMAL LOW (ref 26.0–34.0)
MCHC: 29.5 g/dL — ABNORMAL LOW (ref 30.0–36.0)
MCV: 69.8 fL — ABNORMAL LOW (ref 80.0–100.0)
Platelets: 353 10*3/uL (ref 150–400)
RBC: 5.87 MIL/uL — ABNORMAL HIGH (ref 4.22–5.81)
RDW: 21.7 % — ABNORMAL HIGH (ref 11.5–15.5)
WBC: 6.7 10*3/uL (ref 4.0–10.5)
nRBC: 0 % (ref 0.0–0.2)

## 2023-05-29 LAB — PROTIME-INR
INR: 1.9 — ABNORMAL HIGH (ref 0.8–1.2)
Prothrombin Time: 22.3 s — ABNORMAL HIGH (ref 11.4–15.2)

## 2023-05-29 LAB — LACTATE DEHYDROGENASE: LDH: 183 U/L (ref 98–192)

## 2023-05-29 NOTE — Progress Notes (Signed)
Pt presents for sick visit in VAD clinic today alone. Denies issues with VAD equipment or drive line.   Pt walked into clinic independently. VAD Coordinator paged this morning due to reported dizziness at Endocrinology nutrition appointment this morning. Pt states he was sitting down when dizziness occurred. CBG 105 at the time of event and BP 110/60. EMS called to further assess pt. Pt given orange juice and felt as if symptoms improved. VAD Coordinator instructed pt to drink 2 full bottles of water at come to VAD Clinic this afternoon for further assessment.  Pt stated he had eaten less than usual this morning and had not drank anything before his appointment. Upon VAD interrogation suction events noted at time of event. Pt stated symptoms are resolved at this time. Medtronic device interrogated with no events noted.  Pt currently take Torsemide 40mg  in the morning and 60mg  in the evening. Pt instructed to HOLD evening dose of Torsemide tonight and resume as prescribed tomorrow per Dr. Shirlee Latch. Advised pt to ensure proper hydration throughout the day and that if symptoms continue to page VAD Coordinator.  Vital Signs:  Doppler: 78 Automatic BP: 79/64 (74) HR: 88 NSR SpO2: 95% on RA   Destination Therapy due to uncontrolled diabetes   LVAD assessment: HM III: VAD Speed: 5400 rpms Flow: 4.8 Power: 4.0 w    PI: 2.9 Hct: 30    Alarms: none Events: 10-20 PI events Fixed speed: 5400 Low speed limit: 5100  Primary controller: back up battery due for replacement in 17 months Secondary controller: not present at today's visit  I reviewed the LVAD parameters from today and compared the results to the patient's prior recorded data. LVAD interrogation was NEGATIVE for significant power changes, NEGATIVE for clinical alarms and STABLE for PI events/speed drops. No programming changes were made and pump is functioning within specified parameters. Pt is performing daily controller and system monitor  self tests along with completing weekly and monthly maintenance for LVAD equipment.   LVAD equipment check completed and is in good working order. Back-up equipment not present today.   Annual Equipment Maintenance on UBC/PM was performed on 06/2022.    Exit Site Care: Dressing CDI. Anchor secure. Pt has adequate dressing supplies at home.   Significant Events on VAD Support:  none   Device: Medtronic Therapies: ON Last check: today  BP & Labs:  Doppler 78 - Doppler is reflecting modified systolic   Hgb 12.1 - No S/S of bleeding. Specifically denies melena/BRBPR or nosebleeds.   LDH stable at 183 - Denies tea-colored urine. No power elevations noted on interrogation.   Simmie Davies RN,BSN VAD Coordinator  Office: [phone]  24/7 Pager: [phone]

## 2023-05-29 NOTE — Patient Instructions (Signed)
Stop meal time insuln per Dr. Rayford Halsted job on eating healthier foods and improved blood sugars Call Dr. Isidoro Donning office if [facility] are less than 90's before breakfast or drop below 80's during the day Continue to take 30 units of night time insulin Eat meals on time B) 6-8 L)12-2 D)5-7 Keep eating whole plant based foods Eat 2 packets of oatmeal or 1 cup of cooked oats , fruit and nuts, PB or Egg whites with breakfast.

## 2023-05-29 NOTE — Progress Notes (Signed)
Medical Nutrition Therapy  Appointment Start time:  0930  Appointment End time:  1030 Reason for visit Type 2 DM Referral diagnosis: E11.8 Preferred learning style: See  Learning readiness: Change in progress    NUTRITION ASSESSMENT  62 yr old bmale here for Type 2 DM. Sees Dr. Fransico Him, endocrinology. A1C was originally 11.4%. Has a Josephine Igo now. Has changed his diet and has been compliant taking his insulin of Semglee 30 units at night and Humalog 6 units plus SS with meals. He notes he has changed his eating habits a lot since working with Dr. Fransico Him. BS are much better. Josephine Igo report shows is AVG BS is now 136 mg/dl and GMI is 1.6%.  TIR 88% 11% ATG and 1% below target range. >>>>> Mr. Goeller noted he felt dizzy  while in office with me and didn't feel right. Libre showed BS of 105 mg/dl.  Nurse checked his BP and it was 110/60. He has a heart pump. Dr. Fransico Him recommended to have pt get checked out by EMS.   Dr. Fransico Him reviewed his Libre readings and recommend him to stop his meal time insulin. Pt. Verbalized understanding. Pt notes he only ate 1 packet of oatmeal this am and usually eats 2 packets and some fruit.Marland Kitchen Possible reason for lower BS and feeling dizzy. Pt was given 12 oz of OJ and started to feel better. EMS came and advised he needed to call [facility]. [facility] was [facility]. In talking with the patient and assessing his BP and BS readings, they stated he didn't need to go to ER.They requested to see him this afternoon at 1 pm. He noted he would go to  their appt.  Based on their assessment over the phone with the pt feeling better after drinking the OJ and BS coming up, the heart group said he didn't need to be seen in ER and just keep appt this afternoon. He verbalized understanding.   Reviewed need for 45-60 grams of carbs per meal and reviewed meal planning. Pt. Verbalized understanding to stop meal time insulin and eat more carbs and protein with meal.  Clinical Medical  Hx: Past Medical History:  Diagnosis Date   Acute kidney injury (HCC)    Acute respiratory failure (HCC)    Atrial flutter (HCC)    on Eliquis   CHF (congestive heart failure) (HCC)    EF 20-25% 10/15/21, AICD in place.   Coronary artery disease    Diabetes mellitus without complication (HCC)    DVT (deep venous thrombosis) (HCC) 06/2019   Heart murmur    Hypertension    Presence of permanent cardiac pacemaker    Renal disorder     Medications:  Current Outpatient Medications on File Prior to Visit  Medication Sig Dispense Refill   atorvastatin (LIPITOR) 20 MG tablet Take 1 tablet (20 mg total) by mouth daily. 90 tablet 3   Calcium Carb-Cholecalciferol 600-10 MG-MCG TABS Take 1 tablet by mouth daily with breakfast. 90 tablet 3   Continuous Glucose Receiver (FREESTYLE LIBRE 3 READER) DEVI 1 each by Does not apply route daily. 2 each 3   digoxin (LANOXIN) 0.125 MG tablet Take 0.5 tablets (0.0625 mg total) by mouth daily. 30 tablet 5   empagliflozin (JARDIANCE) 10 MG TABS tablet Take 1 tablet (10 mg total) by mouth daily. 30 tablet 6   insulin glargine (SEMGLEE, YFGN,) 100 UNIT/ML Solostar Pen Inject 30 Units into the skin at bedtime. 15 mL 1   insulin lispro (  HUMALOG KWIKPEN) 100 UNIT/ML KwikPen Inject 6-12 Units into the skin 3 (three) times daily before meals. Use 4 units three times a day with meals 30 mL 1   levothyroxine (SYNTHROID) 25 MCG tablet Take 1 tablet (25 mcg total) by mouth daily before breakfast. 90 tablet 3   magnesium oxide (MAG-OX) 400 (240 Mg) MG tablet Take 1 tablet (400 mg total) by mouth 2 (two) times daily. 180 tablet 3   Multiple Vitamin (MULTIVITAMIN WITH MINERALS) TABS tablet Take 1 tablet by mouth daily.     pantoprazole (PROTONIX) 40 MG tablet Take 1 tablet (40 mg total) by mouth daily. 90 tablet 3   potassium chloride SA (KLOR-CON M) 20 MEQ tablet Take 2 tablets (40 mEq total) by mouth daily. 180 tablet 3   sertraline (ZOLOFT) 50 MG tablet Take 1 tablet (50  mg total) by mouth daily. 90 tablet 3   sildenafil (REVATIO) 20 MG tablet Take 1 tablet (20 mg total) by mouth 3 (three) times daily. 90 tablet 6   spironolactone (ALDACTONE) 25 MG tablet Take 0.5 tablets (12.5 mg total) by mouth at bedtime. 45 tablet 3   tiotropium (SPIRIVA HANDIHALER) 18 MCG inhalation capsule Place 1 capsule (18 mcg total) into inhaler and inhale daily. 30 capsule 12   torsemide (DEMADEX) 20 MG tablet Take 2 tablets (40 mg total) by mouth every morning AND 3 tablets (60 mg total) every evening. Take 3 tablets (60 mg) daily. 150 tablet 6   warfarin (COUMADIN) 3 MG tablet Take 6 mg (2 tablets) every Tuesday/Thursday/Saturday and 3 mg (1 tablet) all other days or as directed by heart failure clinic 150 tablet 3   acetaminophen (TYLENOL) 325 MG tablet Take 1-2 tablets (325-650 mg total) by mouth every 4 (four) hours as needed for mild pain.     Continuous Glucose Sensor (FREESTYLE LIBRE 3 SENSOR) MISC PLACE 1 SENSOR ON THE SKIN EVERY 14 DAYS 2 each 3   Fe Fum-Vit C-Vit B12-FA (TRIGELS-F FORTE) CAPS capsule Take 1 capsule by mouth daily after breakfast. 30 capsule 6   Insulin Pen Needle (PEN NEEDLES) 32G X 4 MM MISC Use with insulin pen with breakfast, with lunch, and with evening meal. 100 each 0   No current facility-administered medications on file prior to visit.    Labs:  This SmartLink has not been configured with any valid records.      Latest Ref Rng & Units 05/14/2023   10:14 AM 03/12/2023   10:11 AM 01/30/2023    8:45 AM  CMP  Glucose 70 - 99 mg/dL 88  161  096   BUN 6 - 20 mg/dL 28  34  34   Creatinine 0.61 - 1.24 mg/dL 0.45  4.09  8.11   Sodium 135 - 145 mmol/L 137  131  131   Potassium 3.5 - 5.1 mmol/L 4.0  4.0  3.8   Chloride 98 - 111 mmol/L 97  92  93   CO2 22 - 32 mmol/L 27  26  25    Calcium 8.9 - 10.3 mg/dL 9.6  8.9  9.1   Lipid Panel     Component Value Date/Time   CHOL 212 (H) 06/27/2022 1825   TRIG 134 06/27/2022 1825   HDL 55 06/27/2022 1825    CHOLHDL 3.9 06/27/2022 1825   VLDL 27 06/27/2022 1825   LDLCALC 130 (H) 06/27/2022 1825   Lab Results  Component Value Date   HGBA1C 11.1 (A) 04/05/2023     Notable Signs/Symptoms: has  had quite a few low blood sugars recently.  Lifestyle & Dietary Hx Lives by himself. Meals mostly eaten at home.  Estimated daily fluid intake: 40 oz Supplements: MVI Sleep: good Stress / self-care: none Current average weekly physical activity: adl  24-Hr Dietary Recall First Meal: 1 packet of oatmeal and blueberries Snack:  Second Meal: usually eating dried beans, vegetables and fruits. Snack: only if BS is low-fruit and pb Third Meal:  usually eating dried beans, vegetables and fruits. Snack: only is BS is low- fruit and PB Beverages: water  Estimated Energy Needs Calories: 2000-2200 Carbohydrate: 200g Protein: 135g Fat: 50g   NUTRITION DIAGNOSIS  NB-1.1 Food and nutrition-related knowledge deficit As related to DIabetes Type 2 .  As evidenced by A1C 11.4%.   NUTRITION INTERVENTION  Nutrition education (E-1) on the following topics:  Nutrition and Diabetes education provided on My Plate, CHO counting, meal planning, portion sizes, timing of meals, avoiding snacks between meals unless having a low blood sugar, target ranges for A1C and blood sugars, signs/symptoms and treatment of hyper/hypoglycemia, monitoring blood sugars, taking medications as prescribed, benefits of exercising 30 minutes per day and prevention of complications of DM. Lifestyle Medicine  - Whole Food, Plant Predominant Nutrition is highly recommended: Eat Plenty of vegetables, Mushrooms, fruits, Legumes, Whole Grains, Nuts, seeds in lieu of processed meats, processed snacks/pastries red meat, poultry, eggs.    -It is better to avoid simple carbohydrates including: Cakes, Sweet Desserts, Ice Cream, Soda (diet and regular), Sweet Tea, Candies, Chips, Cookies, Store Bought Juices, Alcohol in Excess of  1-2 drinks a  day, Lemonade,  Artificial Sweeteners, Doughnuts, Coffee Creamers, "Sugar-free" Products, etc, etc.  This is not a complete list.....  Exercise: If you are able: 30 -60 minutes a day ,4 days a week, or 150 minutes a week.  The longer the better.  Combine stretch, strength, and aerobic activities.  If you were told in the past that you have high risk for cardiovascular diseases, you may seek evaluation by your heart doctor prior to initiating moderate to intense exercise programs.   Handouts Provided Include  .SIgns/treatment  of low blood sugars My Plate   Learning Style & Readiness for Change Teaching method utilized: Visual & Auditory  Demonstrated degree of understanding via: Teach Back  Barriers to learning/adherence to lifestyle change: none  Goals Established by Pt Stop meal time insuln per Dr. Rayford Halsted job on eating healthier foods and improved blood sugars Call Dr. Isidoro Donning office if [facility] are less than 90's before breakfast or drop below 80's during the day Continue to take 30 units of night time insulin Eat meals on time B) 6-8 L)12-2 D)5-7 Keep eating whole plant based foods Eat 2 packets of oatmeal or 1 cup of cooked oats , fruit and nuts, PB or Egg whites with breakfast.    MONITORING & EVALUATION Dietary intake, weekly physical activity, and BS  in 1 month.  Next Steps  Patient is to stop meal time insulin and eat three balanced meals as discussed. Marland Kitchen

## 2023-05-29 NOTE — Addendum Note (Signed)
Encounter addended by: Flora Lipps, RN on: 05/29/2023 4:57 PM  Actions taken: Vitals modified, Charge Capture section accepted

## 2023-06-15 ENCOUNTER — Other Ambulatory Visit (HOSPITAL_COMMUNITY): Payer: Self-pay | Admitting: *Deleted

## 2023-06-15 DIAGNOSIS — Z7901 Long term (current) use of anticoagulants: Secondary | ICD-10-CM

## 2023-06-15 DIAGNOSIS — Z95811 Presence of heart assist device: Secondary | ICD-10-CM

## 2023-06-19 ENCOUNTER — Ambulatory Visit (HOSPITAL_COMMUNITY): Payer: Self-pay | Admitting: Pharmacist

## 2023-06-19 ENCOUNTER — Other Ambulatory Visit (HOSPITAL_COMMUNITY)
Admission: RE | Admit: 2023-06-19 | Discharge: 2023-06-19 | Disposition: A | Discharge status: Home / Self Care | Payer: 59 | Source: Ambulatory Visit | Attending: Cardiology | Admitting: Cardiology

## 2023-06-19 DIAGNOSIS — Z7901 Long term (current) use of anticoagulants: Secondary | ICD-10-CM | POA: Diagnosis present

## 2023-06-19 DIAGNOSIS — Z95811 Presence of heart assist device: Secondary | ICD-10-CM | POA: Insufficient documentation

## 2023-06-19 LAB — PROTIME-INR
INR: 1.9 — ABNORMAL HIGH (ref 0.8–1.2)
Prothrombin Time: 22.2 s — ABNORMAL HIGH (ref 11.4–15.2)

## 2023-07-05 ENCOUNTER — Other Ambulatory Visit (HOSPITAL_COMMUNITY): Payer: Self-pay | Admitting: Unknown Physician Specialty

## 2023-07-05 DIAGNOSIS — Z95811 Presence of heart assist device: Secondary | ICD-10-CM

## 2023-07-05 DIAGNOSIS — Z7901 Long term (current) use of anticoagulants: Secondary | ICD-10-CM

## 2023-07-09 ENCOUNTER — Ambulatory Visit (HOSPITAL_COMMUNITY): Payer: Self-pay | Admitting: Pharmacist

## 2023-07-09 ENCOUNTER — Ambulatory Visit (HOSPITAL_COMMUNITY)
Admission: RE | Admit: 2023-07-09 | Discharge: 2023-07-09 | Disposition: A | Discharge status: Home / Self Care | Payer: 59 | Source: Ambulatory Visit | Attending: Cardiology | Admitting: Cardiology

## 2023-07-09 ENCOUNTER — Encounter (HOSPITAL_COMMUNITY): Payer: Self-pay | Admitting: Cardiology

## 2023-07-09 DIAGNOSIS — Z86718 Personal history of other venous thrombosis and embolism: Secondary | ICD-10-CM | POA: Insufficient documentation

## 2023-07-09 DIAGNOSIS — D6489 Other specified anemias: Secondary | ICD-10-CM | POA: Diagnosis not present

## 2023-07-09 DIAGNOSIS — N183 Chronic kidney disease, stage 3 unspecified: Secondary | ICD-10-CM | POA: Insufficient documentation

## 2023-07-09 DIAGNOSIS — J449 Chronic obstructive pulmonary disease, unspecified: Secondary | ICD-10-CM | POA: Diagnosis not present

## 2023-07-09 DIAGNOSIS — I5022 Chronic systolic (congestive) heart failure: Secondary | ICD-10-CM | POA: Diagnosis not present

## 2023-07-09 DIAGNOSIS — E1165 Type 2 diabetes mellitus with hyperglycemia: Secondary | ICD-10-CM | POA: Insufficient documentation

## 2023-07-09 DIAGNOSIS — Z7901 Long term (current) use of anticoagulants: Secondary | ICD-10-CM | POA: Diagnosis not present

## 2023-07-09 DIAGNOSIS — Z95811 Presence of heart assist device: Secondary | ICD-10-CM | POA: Diagnosis not present

## 2023-07-09 DIAGNOSIS — I428 Other cardiomyopathies: Secondary | ICD-10-CM | POA: Insufficient documentation

## 2023-07-09 DIAGNOSIS — Z4509 Encounter for adjustment and management of other cardiac device: Secondary | ICD-10-CM | POA: Diagnosis present

## 2023-07-09 DIAGNOSIS — E039 Hypothyroidism, unspecified: Secondary | ICD-10-CM | POA: Diagnosis not present

## 2023-07-09 LAB — PROTIME-INR
INR: 2.2 — ABNORMAL HIGH (ref 0.8–1.2)
Prothrombin Time: 25.1 s — ABNORMAL HIGH (ref 11.4–15.2)

## 2023-07-09 LAB — COMPREHENSIVE METABOLIC PANEL
ALT: 27 U/L (ref 0–44)
AST: 31 U/L (ref 15–41)
Albumin: 3.8 g/dL (ref 3.5–5.0)
Alkaline Phosphatase: 164 U/L — ABNORMAL HIGH (ref 38–126)
Anion gap: 9 (ref 5–15)
BUN: 25 mg/dL — ABNORMAL HIGH (ref 6–20)
CO2: 27 mmol/L (ref 22–32)
Calcium: 9.6 mg/dL (ref 8.9–10.3)
Chloride: 96 mmol/L — ABNORMAL LOW (ref 98–111)
Creatinine, Ser: 1.57 mg/dL — ABNORMAL HIGH (ref 0.61–1.24)
GFR, Estimated: 50 mL/min — ABNORMAL LOW (ref >60)
Glucose, Bld: 224 mg/dL — ABNORMAL HIGH (ref 70–99)
Potassium: 3.8 mmol/L (ref 3.5–5.1)
Sodium: 132 mmol/L — ABNORMAL LOW (ref 135–145)
Total Bilirubin: 0.9 mg/dL (ref <1.2)
Total Protein: 8.2 g/dL — ABNORMAL HIGH (ref 6.5–8.1)

## 2023-07-09 LAB — CBC
HCT: 47.7 % (ref 39.0–52.0)
Hemoglobin: 14.2 g/dL (ref 13.0–17.0)
MCH: 20.5 pg — ABNORMAL LOW (ref 26.0–34.0)
MCHC: 29.8 g/dL — ABNORMAL LOW (ref 30.0–36.0)
MCV: 68.8 fL — ABNORMAL LOW (ref 80.0–100.0)
Platelets: 394 10*3/uL (ref 150–400)
RBC: 6.93 MIL/uL — ABNORMAL HIGH (ref 4.22–5.81)
RDW: 21.6 % — ABNORMAL HIGH (ref 11.5–15.5)
WBC: 7.7 10*3/uL (ref 4.0–10.5)
nRBC: 0 % (ref 0.0–0.2)

## 2023-07-09 LAB — LACTATE DEHYDROGENASE: LDH: 198 U/L — ABNORMAL HIGH (ref 98–192)

## 2023-07-09 LAB — MAGNESIUM: Magnesium: 2.1 mg/dL (ref 1.7–2.4)

## 2023-07-09 LAB — PREALBUMIN: Prealbumin: 23 mg/dL (ref 18–38)

## 2023-07-09 LAB — DIGOXIN LEVEL: Digoxin Level: 0.4 ng/mL — ABNORMAL LOW (ref 0.8–2.0)

## 2023-07-09 NOTE — Progress Notes (Addendum)
Pt presents for 2 month f/u  with 1 year Intermacs and Annual Maintenance in VAD clinic today alone. Denies issues with VAD equipment or drive line.   Pt walked into clinic independently. States he is overall feeling well. Denies lightheadedness, dizziness, falls, shortness of breath, and signs of bleeding. 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  Per Dr Shirlee Latch pt will continue to take Torsemide 40 mg in AM 60 mg in PM. Pt reports very good urine output with this dose.   Pt reports intermittent itching at exit site. Reminded him to rinse with saline wipe after cleansing with CHG. He verbalized understanding. Pt also reports he has been getting in tub, but sitting on stool and changing dressing if he gets it wet. Dressing changed today - see below.   Pt endorses compliance in checking his blood sugars. See endocrinology regularly and is working with registered dietitian to help blood sugar control.   Dr. Shirlee Latch discussed heart transplant with pt at today's visit. Pt ultimately interested in pursing if deemed eligible by [facility] transplant team. Referral sent to [facility] Transplant team today to be reviewed by Dr. Edwena Blow per Dr. Shirlee Latch.  Pt enrollment form completed and sent to [facility] last visit; however, pt states he has not received his machine. VAD Coordinator followed up with [facility] today. Pt needs to call intake team to confirm shipping address. Pt given customer service number.  VAD Coordinator attempted to change back up battery in back up controller; however, alert to replace back up battery continued to display on banner. Decision made to replace back up controller. New back up controller SN: [id number] Exp: 02/10/25.  Vital Signs:  Doppler: 110     Automatic BP: 96/77 (85)    HR: 84 NSR     SpO2: 98% on RA  Destination Therapy due to uncontrolled diabetes   LVAD assessment: HM III:  VAD Speed: 5400 rpms Flow: 4.5     Power: 4.2 w       PI: 5.1      Hct: 30    Alarms: none Events: 0-5 PI events daily  Fixed speed: 5400 Low speed limit: 5100  Primary controller: back up battery due for replacement in 15 months Secondary controller: back up controller replaced with SN: [id number]  I reviewed the LVAD parameters from today and compared the results to the patient's prior recorded data. LVAD interrogation was NEGATIVE for significant power changes, NEGATIVE for clinical alarms and STABLE for PI events/speed drops. No programming changes were made and pump is functioning within specified parameters. Pt is performing daily controller and system monitor self tests along with completing weekly and monthly maintenance for LVAD equipment.   LVAD equipment check completed and is in good working order. Back-up equipment not present today.   Annual Equipment Maintenance on UBC/PM was performed on 06/2023.   Exit Site Care: Existing VAD dressing removed and site care performed using sterile technique. Drive line exit site cleaned with Chlora prep applicators x 2, rinsed with saline, allowed to dry, and Sorbaview dressing with Silverlon patch applied. Exit site healed and incorporated , velour is fully implanted at exit site. No redness, tenderness, drainage, foul odor or rash noted. Drive line anchor re-applied. Covered all with 2 large tegaderms in preparation for shower later this evening. Pt denies fever or chills. Pt given 8 weekly kits for home use along with 2 boxes of large tegaderms.   Significant Events on VAD Support:  none   Device:  N/A  Batteries Manufacture Date: Number of uses: Re-calibration  07/03/22 79 Performed by patient  07/03/22 80 Performed by patient  07/03/22 81 Performed by patient  07/03/22 75 Performed by patient  07/03/22 76 Performed by patient  07/03/22 77 Performed by patient   Annual maintenance completed per [employer] on patient's home power module and universal Magazine features editor.    Backup system controller 11 volt battery  charged during visit.   1 year Intermacs follow up completed including:  Quality of Life, [name], and Neurocognitive trail making.   Pt completed 1020 feet during 6 minute walk.  [place] Cardiomyopathy Questionnaire     07/09/2023   11:23 AM 01/01/2023    2:59 PM 10/10/2022    1:17 PM  [name]  1 a. Ability to shower/bathe Not at all limited Other, Did not do Not at all limited  1 b. Ability to walk 1 block Not at all limited Slightly limited Slightly limited  1 c. Ability to hurry/jog Extremely limited Extremely limited Extremely limited  2. Edema feet/ankles/legs Never over the past 2 weeks Less than once a week Less than once a week  3. Limited by fatigue Never over the past 2 weeks Less than once a week 1-2 times a week  4. Limited by dyspnea Never over the past 2 weeks Less than once a week 1-2 times a week  5. Sitting up / on 3+ pillows Never over the past 2 weeks Never over the past 2 weeks Never over the past 2 weeks  6. Limited enjoyment of life Slightly limited Slightly limited Not limited at all  7. Rest of life w/ symptoms Completely satisfied Completely satisfied Completely satisfied  8 a. Participation in hobbies Moderately limited Limited quite a bit Slightly limited  8 b. Participation in chores Did not limit at all Severely limited Slightly limited  8 c. Visiting family/friends Slightly limited N/A, did not do for other reasons Did not limit at all     Patient Goals: " I would like to travel more this upcoming year"   BP & Labs:  Doppler 110 - Doppler is reflecting modified systolic   Hgb 14.2 - No S/S of bleeding. Specifically denies melena/BRBPR or nosebleeds.   LDH stable at 198 - Denies tea-colored urine. No power elevations noted on interrogation.   Patient Instructions: No medication changes today Referral sent to [facility] Transplant team Follow up in VAD Clinic in 2 months  Darryl Davies RN,BSN VAD Coordinator  Office: [phone]  24/7 Pager:  [phone]     ADVANCED HF/VAD CLINIC NOTE   Darryl Frye is a 62 y.o. with history of atrial fibrillation/flutter and nonischemic cardiomyopathy who was initially referred by Dr. Wyline Mood for evaluation of CHF.  Patient was diagnosed with CHF in 2020 at [facility] in [place].  At the time, he drank heavily and used cocaine.  He stopped drinking, smoking, and cocaine in 2020 and has not used any of these substances since then. Echo in 11/20 showed EF < 10%, cath in 12/20 showed normal coronaries.  Echo has shown persistently low EF over time, echo in 2/23 showed EF 20-25%.  He has a Medtronic ICD.      Patient was admitted in 2/23 with atrial flutter/RVR. He was started on amiodarone and converted to NSR.  He had atrial flutter ablation in 5/23 and amiodarone was stopped  Echo was done in 11/23 showing elevated filling pressure and markedly low cardiac output.  Patient was admitted, started on milrinone, and  underwent HM3 LVAD placement with LAA clipping later in 11/23.  Post-op course complicated by acute hypoxemic respiratory failure post-op and PNA.  Patient went to CIR after acute hospital course.   Ramp echo was in 4/24, at 5500 bpm, the IV septum was shifted mildly to the left with moderate RV dilation and dysfunction, AoV opened rarely.  Speed decreased to 5400 bpm, slight opening AoV every beat, trivial AI, midline IV septum.  Speed left at 5400 bpm.   Patient returns for LVAD followup.  Weight down 4 lbs.  MAP stable at 85. He is doing custodial work 3 days/week.  No dyspnea walking on flat ground, gets short of breath with heavier exertion and stops to rest.  No lightheadedness.  No BRBPR/melena.  LVAD parameters stable.   Labs (12/23): K 4.3, creatinine 1.25 => 1.45, Na 128, hgb 8.8 => 9.6, LDH 211, LFTs normal, TSH normal, digoxin 0.8 Labs (1/24): K 3.9, creatinine 1.32, LDH 224, hgb 10.4 Labs (2/24): K 4.5, creatinine 1.33 Labs (4/24): creatinine 1.16, hgb 10.9 => 11 Labs (6/24): K 3.8,  creatinine 1.32 Labs (7/24): K 4, creatinine 1.39, hgb 12.3, LDH 219 Labs (10/24): K 4.6, creatinine 1.25, hgb 12.1, LDH 183  PMH: 1. Atrial fibrillation/flutter: Mainly flutter.  - 5/23 Atrial flutter ablation.  2. H/o DVT 3. Chronic systolic CHF: Nonischemic cardiomyopathy.  Medtronic ICD.  - Echo (11/20): EF < 10% - Cath (12/20): Normal coronaries.  - Echo (8/22): EF 30%, mildly decreased RV systolic function.  - Echo (2/23): EF 20-25%, normal RV.  - RHC (11/23): mean RA 14, PA 66/53 mean 54, mean PCWP 43, CI 1.62 F/1.31 T - HM3 LVAD with LAA clipping on 07/04/22.  - Ramp echo (2/24): Speed increased to 5500 rpm.  At this speed, IV septum was midline.  Aortic valve opened 1/3 beats.  Mild RV enlargement with moderate RV dysfunction.  - Ramp echo (3/24): Speed decreased back to 5400 rpm as the IV septum was shifted left.  Midline septum at 5400 rpm.  4. Prior cocaine abuse.  5. Prior heavy ETOH 6. COPD: Moderate obstruction on 11/23 PFTs. No longer smokes.  7. Type 2 diabetes   SH: Patient was a heavy drinker, abused cocaine, and smoked in the past but quit in 2020.  Lives in [place]. He does part-time custodial work.    FH: Mother with PPM, aunt with CHF.    VAD Indication: Destination Therapy due to uncontrolled diabetes   LVAD assessment: HM III: See LVAD nurse's note above.    I reviewed the LVAD parameters from today and compared the results to the patient's prior recorded data. LVAD interrogation was NEGATIVE for significant power changes, NEGATIVE for clinical alarms and STABLE for PI events/speed drops. No programming changes were made and pump is functioning within specified parameters. Pt is performing daily controller and system monitor self tests along with completing weekly and monthly maintenance for LVAD equipment.   LVAD equipment check completed and is in good working order. Back-up equipment present. Charged back up battery and performed self-test on  equipment.    Annual Equipment Maintenance on UBC/PM was performed on 06/2022.      ROS: All systems negative except as listed in HPI, PMH and Problem List.  Current Outpatient Medications  Medication Sig Dispense Refill   acetaminophen (TYLENOL) 325 MG tablet Take 1-2 tablets (325-650 mg total) by mouth every 4 (four) hours as needed for mild pain.     atorvastatin (LIPITOR) 20 MG tablet Take  1 tablet (20 mg total) by mouth daily. 90 tablet 3   Calcium Carb-Cholecalciferol 600-10 MG-MCG TABS Take 1 tablet by mouth daily with breakfast. 90 tablet 3   Continuous Glucose Receiver (FREESTYLE LIBRE 3 READER) DEVI 1 each by Does not apply route daily. 2 each 3   Continuous Glucose Sensor (FREESTYLE LIBRE 3 SENSOR) MISC PLACE 1 SENSOR ON THE SKIN EVERY 14 DAYS 2 each 3   digoxin (LANOXIN) 0.125 MG tablet Take 0.5 tablets (0.0625 mg total) by mouth daily. 30 tablet 5   empagliflozin (JARDIANCE) 10 MG TABS tablet Take 1 tablet (10 mg total) by mouth daily. 30 tablet 6   insulin glargine (SEMGLEE, YFGN,) 100 UNIT/ML Solostar Pen Inject 30 Units into the skin at bedtime. 15 mL 1   Insulin Pen Needle (PEN NEEDLES) 32G X 4 MM MISC Use with insulin pen with breakfast, with lunch, and with evening meal. 100 each 0   levothyroxine (SYNTHROID) 25 MCG tablet Take 1 tablet (25 mcg total) by mouth daily before breakfast. 90 tablet 3   magnesium oxide (MAG-OX) 400 (240 Mg) MG tablet Take 1 tablet (400 mg total) by mouth 2 (two) times daily. 180 tablet 3   Multiple Vitamin (MULTIVITAMIN WITH MINERALS) TABS tablet Take 1 tablet by mouth daily.     potassium chloride SA (KLOR-CON M) 20 MEQ tablet Take 2 tablets (40 mEq total) by mouth daily. 180 tablet 3   sertraline (ZOLOFT) 50 MG tablet Take 1 tablet (50 mg total) by mouth daily. 90 tablet 3   sildenafil (REVATIO) 20 MG tablet Take 1 tablet (20 mg total) by mouth 3 (three) times daily. 90 tablet 6   spironolactone (ALDACTONE) 25 MG tablet Take 0.5 tablets (12.5  mg total) by mouth at bedtime. 45 tablet 3   tiotropium (SPIRIVA HANDIHALER) 18 MCG inhalation capsule Place 1 capsule (18 mcg total) into inhaler and inhale daily. 30 capsule 12   torsemide (DEMADEX) 20 MG tablet Take 2 tablets (40 mg total) by mouth every morning AND 3 tablets (60 mg total) every evening. Take 3 tablets (60 mg) daily. 150 tablet 6   warfarin (COUMADIN) 3 MG tablet Take 6 mg (2 tablets) every Tuesday/Thursday/Saturday and 3 mg (1 tablet) all other days or as directed by heart failure clinic 150 tablet 3   Fe Fum-Vit C-Vit B12-FA (TRIGELS-F FORTE) CAPS capsule Take 1 capsule by mouth daily after breakfast. (Patient not taking: Reported on 07/09/2023) 30 capsule 6   pantoprazole (PROTONIX) 40 MG tablet Take 1 tablet (40 mg total) by mouth daily. 90 tablet 3   No current facility-administered medications for this encounter.    Patient has no known allergies.  REVIEW OF SYSTEMS: All systems negative except as listed in HPI, PMH and Problem list.   LVAD INTERROGATION:  See LVAD nurse's note above.   I reviewed the LVAD parameters from today, and compared the results to the patient's prior recorded data.  No programming changes were made.  The LVAD is functioning within specified parameters.  The patient performs LVAD self-test daily.  LVAD interrogation was negative for any significant power changes, alarms or PI events/speed drops.  LVAD equipment check completed and is in good working order.  Back-up equipment present.   LVAD education done on emergency procedures and precautions and reviewed exit site care.    Vitals:   07/09/23 0930 07/09/23 0931  BP: 96/77 (!) 110/0  Pulse: 84   SpO2: 98%   Weight: 90.4 kg (199 lb 3.2 oz)  Vital Signs:  BP (!) 110/0   Pulse 84   Wt 90.4 kg (199 lb 3.2 oz)   SpO2 98%   BMI 27.78 kg/m  MAP 85 Physical Exam: General: Well appearing this am. NAD.  HEENT: Normal. Neck: Supple, JVP 8 cm. Carotids OK.  Cardiac:  Mechanical heart  sounds with LVAD hum present.  Lungs:  CTAB, normal effort.  Abdomen:  NT, ND, no HSM. No bruits or masses. +BS  LVAD exit site: Well-healed and incorporated. Dressing dry and intact. No erythema or drainage. Stabilization device present and accurately applied. Driveline dressing changed daily per sterile technique. Extremities:  Warm and dry. No cyanosis, clubbing, rash, or edema.  Neuro:  Alert & oriented x 3. Cranial nerves grossly intact. Moves all 4 extremities w/o difficulty. Affect pleasant    ASSESSMENT AND PLAN:   1. Chronic systolic CHF: Nonischemic cardiomyopathy, diagnosed 2020.  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  Cath in 12/20 with no significant coronary disease.  Medtronic ICD. Echo in 2/23 showed EF 20-25% with normal RV.  RHC in 11/13 showed markedly elevated filling pressures, primarily pulmonary venous hypertension, low cardiac output, and low PAPI. Echo in 11/23 with EF <20%, mod RV dysfunction.  He was admitted and started on milrinone with improvement in hemodynamics.  We proceeded with HM3 LVAD with LAA clip on 07/04/22.  NYHA class II, weight down.  He does not look significantly volume overloaded.  - Continue torsemide 40 qam/60 qpm.  BMET today.  - Continue Jardiance 10 mg daily.  - Continue sildenafil 20 tid for RV.  - Continue spironolactone 12.5 daily.   - Continue digoxin 0.0625 daily, check level today.  - He is interested in transplant.  He is now 1 year out from LVAD.  Will refer to [facility] for evaluation.  2. Atrial fibrillation/atrial flutter: H/o flutter ablation.  Had AF/RVR post-LVAD, went back into NSR.  He is now off amiodarone.   - Continue warfarin.  3. VAD management: s/p HM-3 VAD on 07/04/22. Ramp echo in 4/24, speed decreased to 5400 rpm.  Stable LVAD parameters.  - Now  off ASA.  - Continue warfarin, INR goal 2-2.5. 4. H/o DVT: On anticoagulation.  5. CKD stage 3: On Jardiance.  - BMET today.  6. Hypothyroidism - On levothyroxine.  7. Anemia: post-op.  - CBC today.  8. COPD: Moderate on 11/23 PFTs.  Suspect this plays a role in his dyspnea.  - Continue Spiriva.     9. Diabetes: Endocrinology followup.  Followup in 2 months.   Marca Ancona 07/09/2023

## 2023-07-12 ENCOUNTER — Other Ambulatory Visit (HOSPITAL_COMMUNITY): Payer: Self-pay | Admitting: Cardiology

## 2023-07-12 DIAGNOSIS — Z95811 Presence of heart assist device: Secondary | ICD-10-CM

## 2023-07-12 DIAGNOSIS — I509 Heart failure, unspecified: Secondary | ICD-10-CM

## 2023-07-18 ENCOUNTER — Ambulatory Visit (INDEPENDENT_AMBULATORY_CARE_PROVIDER_SITE_OTHER): Payer: 59 | Admitting: "Endocrinology

## 2023-07-18 ENCOUNTER — Encounter: Payer: 59 | Attending: "Endocrinology | Admitting: Nutrition

## 2023-07-18 ENCOUNTER — Encounter: Payer: Self-pay | Admitting: "Endocrinology

## 2023-07-18 VITALS — Ht 71.0 in | Wt 198.0 lb

## 2023-07-18 DIAGNOSIS — Z794 Long term (current) use of insulin: Secondary | ICD-10-CM

## 2023-07-18 DIAGNOSIS — E1169 Type 2 diabetes mellitus with other specified complication: Secondary | ICD-10-CM

## 2023-07-18 DIAGNOSIS — Z95811 Presence of heart assist device: Secondary | ICD-10-CM | POA: Diagnosis present

## 2023-07-18 DIAGNOSIS — E782 Mixed hyperlipidemia: Secondary | ICD-10-CM | POA: Insufficient documentation

## 2023-07-18 DIAGNOSIS — E1100 Type 2 diabetes mellitus with hyperosmolarity without nonketotic hyperglycemic-hyperosmolar coma (NKHHC): Secondary | ICD-10-CM | POA: Insufficient documentation

## 2023-07-18 DIAGNOSIS — I10 Essential (primary) hypertension: Secondary | ICD-10-CM | POA: Diagnosis not present

## 2023-07-18 LAB — POCT GLYCOSYLATED HEMOGLOBIN (HGB A1C): HbA1c, POC (controlled diabetic range): 8 % — AB (ref 0.0–7.0)

## 2023-07-18 MED ORDER — SEMGLEE 100 UNIT/ML ~~LOC~~ SOPN: 35.0000 [IU] | PEN_INJECTOR | Freq: Every day | SUBCUTANEOUS | 1 refills | Status: DC

## 2023-07-18 NOTE — Patient Instructions (Signed)

## 2023-07-18 NOTE — Progress Notes (Signed)
07/18/2023, 9:49 AM  Endocrinology follow-up note   Subjective:    Patient ID: Darryl Frye, male    DOB: 01-04-1961.  Darryl Frye is being seen in follow up after he was seen in  consultation for management of currently uncontrolled symptomatic diabetes requested by  Marylynn Pearson, FNP.   Past Medical History:  Diagnosis Date   Acute kidney injury (HCC)    Acute respiratory failure (HCC)    Atrial flutter (HCC)    on Eliquis   CHF (congestive heart failure) (HCC)    EF 20-25% 10/15/21, AICD in place.   Coronary artery disease    Diabetes mellitus without complication (HCC)    DVT (deep venous thrombosis) (HCC) 06/2019   Heart murmur    Hypertension    Presence of permanent cardiac pacemaker    Renal disorder     Past Surgical History:  Procedure Laterality Date   A-FLUTTER ABLATION N/A 01/12/2022   Procedure: A-FLUTTER ABLATION;  Surgeon: Marinus Maw, MD;  Location: [facility] INVASIVE CV LAB;  Service: Cardiovascular;  Laterality: N/A;   COLONOSCOPY     Dr. Allena Katz; One 4-6 mm hyperplastic rectal polyp, mid left-sided diverticulosis.   CYST REMOVAL NECK     HERNIA REPAIR     INSERTION OF IMPLANTABLE LEFT VENTRICULAR ASSIST DEVICE N/A 07/04/2022   Procedure: INSERTION OF IMPLANTABLE LEFT VENTRICULAR ASSIST DEVICE; ATRICLIP 45;  Surgeon: Lyn Hollingshead, MD;  Location: [facility] OR;  Service: Open Heart Surgery;  Laterality: N/A;   RIGHT HEART CATH N/A 06/27/2022   Procedure: RIGHT HEART CATH;  Surgeon: Laurey Morale, MD;  Location: [facility] INVASIVE CV LAB;  Service: Cardiovascular;  Laterality: N/A;   RIGHT HEART CATH N/A 06/29/2022   Procedure: RIGHT HEART CATH;  Surgeon: Laurey Morale, MD;  Location: [facility] INVASIVE CV LAB;  Service: Cardiovascular;  Laterality: N/A;   TEE WITHOUT CARDIOVERSION N/A 07/04/2022   Procedure: TRANSESOPHAGEAL ECHOCARDIOGRAM (TEE);  Surgeon: Lyn Hollingshead, MD;   Location: [facility] OR;  Service: Open Heart Surgery;  Laterality: N/A;   TONSILLECTOMY      Social History   Socioeconomic History   Marital status: Divorced    Spouse name: Not on file   Number of children: Not on file   Years of education: Not on file   Highest education level: Not on file  Occupational History   Not on file  Tobacco Use   Smoking status: Former    Current packs/day: 0.00    Average packs/day: 1 pack/day for 20.0 years (20.0 ttl pk-yrs)    Types: Cigarettes    Start date: 07/07/1999    Quit date: 07/07/2019    Years since quitting: 4.0   Smokeless tobacco: Never  Vaping Use   Vaping status: Never Used  Substance and Sexual Activity   Alcohol use: Not Currently   Drug use: Not Currently    Types: "Crack" cocaine    Comment: last used over 2 years ago (10/24/21).   Sexual activity: Not on file  Other Topics Concern   Not on file  Social History Narrative   Not on file  Social Determinants of Health   Financial Resource Strain: Low Risk  (06/29/2022)   Overall Financial Resource Strain (CARDIA)    Difficulty of Paying Living Expenses: Not hard at all  Food Insecurity: No Food Insecurity (06/29/2022)   Hunger Vital Sign    Worried About Running Out of Food in the Last Year: Never true    Ran Out of Food in the Last Year: Never true  Transportation Needs: No Transportation Needs (06/29/2022)   PRAPARE - Administrator, Civil Service (Medical): No    Lack of Transportation (Non-Medical): No  Physical Activity: Not on file  Stress: Not on file  Social Connections: Not on file    Family History  Problem Relation Age of Onset   Hyperlipidemia Mother    Heart disease Mother        has PPM   Diabetes Mother    Hypertension Mother    Heart disease Maternal Grandmother    Prostate cancer Maternal Grandfather    Colon cancer Neg Hx    Colon polyps Neg Hx     Outpatient Encounter Medications as of 07/18/2023  Medication Sig   insulin lispro  (HUMALOG KWIKPEN) 100 UNIT/ML KwikPen Inject 5-8 Units into the skin 3 (three) times daily with meals.   acetaminophen (TYLENOL) 325 MG tablet Take 1-2 tablets (325-650 mg total) by mouth every 4 (four) hours as needed for mild pain.   atorvastatin (LIPITOR) 20 MG tablet Take 1 tablet (20 mg total) by mouth daily.   Calcium Carb-Cholecalciferol 600-10 MG-MCG TABS Take 1 tablet by mouth daily with breakfast.   Continuous Glucose Receiver (FREESTYLE LIBRE 3 READER) DEVI 1 each by Does not apply route daily.   Continuous Glucose Sensor (FREESTYLE LIBRE 3 SENSOR) MISC PLACE 1 SENSOR ON THE SKIN EVERY 14 DAYS   digoxin (LANOXIN) 0.125 MG tablet Take 0.5 tablets (0.0625 mg total) by mouth daily.   empagliflozin (JARDIANCE) 10 MG TABS tablet Take 1 tablet (10 mg total) by mouth daily.   Fe Fum-Vit C-Vit B12-FA (TRIGELS-F FORTE) CAPS capsule Take 1 capsule by mouth daily after breakfast. (Patient not taking: Reported on 07/09/2023)   insulin glargine (SEMGLEE, YFGN,) 100 UNIT/ML Solostar Pen Inject 35 Units into the skin at bedtime.   Insulin Pen Needle (PEN NEEDLES) 32G X 4 MM MISC Use with insulin pen with breakfast, with lunch, and with evening meal.   levothyroxine (SYNTHROID) 25 MCG tablet Take 1 tablet (25 mcg total) by mouth daily before breakfast.   magnesium oxide (MAG-OX) 400 (240 Mg) MG tablet Take 1 tablet (400 mg total) by mouth 2 (two) times daily.   Multiple Vitamin (MULTIVITAMIN WITH MINERALS) TABS tablet Take 1 tablet by mouth daily.   pantoprazole (PROTONIX) 40 MG tablet Take 1 tablet (40 mg total) by mouth daily.   potassium chloride SA (KLOR-CON M) 20 MEQ tablet Take 2 tablets (40 mEq total) by mouth daily.   sertraline (ZOLOFT) 50 MG tablet Take 1 tablet (50 mg total) by mouth daily.   sildenafil (REVATIO) 20 MG tablet TAKE 1 TABLET(20 MG) BY MOUTH THREE TIMES DAILY   spironolactone (ALDACTONE) 25 MG tablet Take 0.5 tablets (12.5 mg total) by mouth at bedtime.   tiotropium (SPIRIVA  HANDIHALER) 18 MCG inhalation capsule Place 1 capsule (18 mcg total) into inhaler and inhale daily.   torsemide (DEMADEX) 20 MG tablet Take 2 tablets (40 mg total) by mouth every morning AND 3 tablets (60 mg total) every evening. Take 3 tablets (60 mg)  daily.   warfarin (COUMADIN) 3 MG tablet Take 6 mg (2 tablets) every Tuesday/Thursday/Saturday and 3 mg (1 tablet) all other days or as directed by heart failure clinic   [DISCONTINUED] insulin glargine (SEMGLEE, YFGN,) 100 UNIT/ML Solostar Pen Inject 30 Units into the skin at bedtime.   No facility-administered encounter medications on file as of 07/18/2023.    ALLERGIES: No Known Allergies  VACCINATION STATUS: Immunization History  Administered Date(s) Administered   Influenza,inj,Quad PF,6+ Mos 07/24/2022   Moderna Sars-Covid-2 Vaccination 11/25/2019, 12/23/2019   PNEUMOCOCCAL CONJUGATE-20 07/24/2022    Diabetes He presents for his follow-up diabetic visit. He has type 2 diabetes mellitus. Onset time: He was diagnosed at approximate age of 33. His disease course has been improving. There are no hypoglycemic associated symptoms. Pertinent negatives for hypoglycemia include no confusion, headaches, pallor or seizures. Pertinent negatives for diabetes include no chest pain, no fatigue, no polydipsia, no polyphagia, no polyuria and no weakness. There are no hypoglycemic complications. Symptoms are improving. Diabetic complications include heart disease, impotence and nephropathy. (His diabetes is complicated by CHF with ejection fraction of 20-25% status post permanent cardiac pacemaker, coronary artery disease, comorbid hypertension/hyper lipidemia.) Risk factors for coronary artery disease include dyslipidemia, diabetes mellitus, male sex, hypertension, tobacco exposure and sedentary lifestyle. His weight is decreasing steadily. He is following a generally unhealthy diet. When asked about meal planning, he reported none. He has not had a previous  visit with a dietitian. He never participates in exercise. His home blood glucose trend is decreasing steadily. His breakfast blood glucose range is generally 140-180 mg/dl. His lunch blood glucose range is generally 140-180 mg/dl. His dinner blood glucose range is generally 140-180 mg/dl. His bedtime blood glucose range is generally 140-180 mg/dl. His overall blood glucose range is 140-180 mg/dl. (He has a freestyle libre 3 sensor, presents with TIR of 59 %, 36% Level 1 hyperglycemi, 5% level 2 hyperglycemia.  No hypoglycemia.  His average blood glucose is 172 for the last 14 days.  His POC A1c is 8% improving from  11.1%.  ) An ACE inhibitor/angiotensin II receptor blocker is not being taken.  Hyperlipidemia Exacerbating diseases include chronic renal disease. Pertinent negatives include no chest pain, myalgias or shortness of breath. Risk factors for coronary artery disease include diabetes mellitus, dyslipidemia and male sex.  Hypertension This is a chronic problem. The problem is controlled. Pertinent negatives include no chest pain, headaches, neck pain, palpitations or shortness of breath. Risk factors for coronary artery disease include dyslipidemia, diabetes mellitus, male gender, sedentary lifestyle and smoking/tobacco exposure. Past treatments include diuretics. Hypertensive end-organ damage includes kidney disease, CAD/MI and heart failure. Identifiable causes of hypertension include chronic renal disease.     Review of Systems  Constitutional:  Negative for chills, fatigue, fever and unexpected weight change.  HENT:  Negative for dental problem, mouth sores and trouble swallowing.   Eyes:  Negative for visual disturbance.  Respiratory:  Negative for cough, choking, chest tightness, shortness of breath and wheezing.   Cardiovascular:  Negative for chest pain, palpitations and leg swelling.  Gastrointestinal:  Negative for abdominal distention, abdominal pain, constipation, diarrhea, nausea  and vomiting.  Endocrine: Negative for polydipsia, polyphagia and polyuria.  Genitourinary:  Positive for impotence. Negative for dysuria, flank pain, hematuria and urgency.  Musculoskeletal:  Negative for back pain, gait problem, myalgias and neck pain.  Skin:  Negative for pallor, rash and wound.  Neurological:  Negative for seizures, syncope, weakness, numbness and headaches.  Psychiatric/Behavioral:  Negative  for confusion and dysphoric mood.     Objective:       07/18/2023    9:28 AM 07/09/2023    9:31 AM 07/09/2023    9:30 AM  Vitals with BMI  Height 5\' 11"     Weight 198 lbs  199 lbs 3 oz  BMI 27.63    Systolic  110 96  Diastolic  0 77  Pulse   84    Ht 5\' 11"  (1.803 m)   Wt 198 lb (89.8 kg)   BMI 27.62 kg/m   Wt Readings from Last 3 Encounters:  07/18/23 198 lb (89.8 kg)  07/09/23 199 lb 3.2 oz (90.4 kg)  05/14/23 203 lb 9.6 oz (92.4 kg)     Physical Exam Constitutional:      General: He is not in acute distress.    Appearance: He is well-developed.  HENT:     Head: Normocephalic and atraumatic.  Neck:     Thyroid: No thyromegaly.     Trachea: No tracheal deviation.  Cardiovascular:     Rate and Rhythm: Normal rate.     Pulses:          Dorsalis pedis pulses are 1+ on the right side and 1+ on the left side.       Posterior tibial pulses are 1+ on the right side and 1+ on the left side.     Heart sounds: S1 normal and S2 normal. Murmur heard.     No gallop.     Comments: Patient with CHF ejection fraction 20-25%, with permanent pacemaker. Pulmonary:     Effort: No respiratory distress.     Breath sounds: Normal breath sounds. No wheezing.  Abdominal:     General: Bowel sounds are normal. There is no distension.     Palpations: Abdomen is soft.     Tenderness: There is no abdominal tenderness. There is no guarding.  Musculoskeletal:     Right shoulder: No swelling or deformity.     Cervical back: Normal range of motion and neck supple.  Skin:     General: Skin is warm and dry.     Findings: No rash.     Nails: There is no clubbing.  Neurological:     Mental Status: He is alert and oriented to person, place, and time.     Cranial Nerves: No cranial nerve deficit.     Sensory: No sensory deficit.     Gait: Gait normal.     Deep Tendon Reflexes: Reflexes are normal and symmetric.  Psychiatric:        Speech: Speech normal.        Behavior: Behavior normal. Behavior is cooperative.        Thought Content: Thought content normal.        Judgment: Judgment normal.     CMP ( most recent) CMP     Component Value Date/Time   NA 132 (L) 07/09/2023 0858   NA 135 11/23/2021 0814   K 3.8 07/09/2023 0858   CL 96 (L) 07/09/2023 0858   CO2 27 07/09/2023 0858   GLUCOSE 224 (H) 07/09/2023 0858   BUN 25 (H) 07/09/2023 0858   BUN 20 11/23/2021 0814   CREATININE 1.57 (H) 07/09/2023 0858   CALCIUM 9.6 07/09/2023 0858   PROT 8.2 (H) 07/09/2023 0858   ALBUMIN 3.8 07/09/2023 0858   AST 31 07/09/2023 0858   ALT 27 07/09/2023 0858   ALKPHOS 164 (H) 07/09/2023 0858   BILITOT 0.9 07/09/2023  0858   EGFR 58 (L) 11/23/2021 0814   GFRNONAA 50 (L) 07/09/2023 0858     Diabetic Labs (most recent): Lab Results  Component Value Date   HGBA1C 8.0 (A) 07/18/2023   HGBA1C 11.1 (A) 04/05/2023   HGBA1C 9.3 (H) 06/27/2022     Lipid Panel ( most recent) Lipid Panel     Component Value Date/Time   CHOL 212 (H) 06/27/2022 1825   TRIG 134 06/27/2022 1825   HDL 55 06/27/2022 1825   CHOLHDL 3.9 06/27/2022 1825   VLDL 27 06/27/2022 1825   LDLCALC 130 (H) 06/27/2022 1825      Lab Results  Component Value Date   TSH 8.560 (H) 10/10/2022   TSH 4.362 08/17/2022   TSH 2.576 06/27/2022   TSH 2.723 10/15/2021   FREET4 1.28 (H) 08/17/2022   FREET4 1.06 06/27/2022      Assessment & Plan:   1. Type 2 diabetes mellitus with other specified complication, with Kozlowski-term current use of insulin (HCC) - Quantae E Kwiatek has currently uncontrolled  symptomatic type 2 DM since  62 years of age. He has a freestyle libre 3 sensor, presents with TIR of 59 %, 36% Level 1 hyperglycemi, 5% level 2 hyperglycemia.  No hypoglycemia.  His average blood glucose is 172 for the last 14 days.  His POC A1c is 8% improving from  11.1%.    Recent labs reviewed. - I had a [place] discussion with him about the possible risk factors and  the pathology behind its diabetes and its complications. -his diabetes is complicated by  by CHF with ejection fraction of 20-25% status post permanent cardiac pacemaker, coronary artery disease, comorbid hypertension/hyper lipidemia and he remains at exceedingly high risk for more acute and chronic complications which include CAD, CVA, CKD, retinopathy, and neuropathy. These are all discussed in detail with him.  - I discussed all available options of managing his diabetes including de-escalation of medications. I have counseled him on Food as Medicine by adopting a Whole Food , Plant Predominant  ( WFPP) nutrition as recommended by [employer] of Lifestyle Medicine. Patient is encouraged to switch to  unprocessed or minimally processed  complex starch, adequate protein intake (mainly plant source), minimal liquid fat, plenty of fruits, and vegetables. -  he is advised to stick to a routine mealtimes to eat 3 complete meals a day and snack only when necessary ( to snack only to correct hypoglycemia BG <70 day time or <100 at night).   - he acknowledges that there is a room for improvement in his food and drink choices. - Suggestion is made for him to avoid simple carbohydrates  from his diet including Cakes, Sweet Desserts, Ice Cream, Soda (diet and regular), Sweet Tea, Candies, Chips, Cookies, Store Bought Juices, Alcohol , Artificial Sweeteners,  Coffee Creamer, and "Sugar-free" Products, Lemonade. This will help patient to have more stable blood glucose profile and potentially avoid unintended weight gain.  The following  Lifestyle Medicine recommendations according to American College of Lifestyle Medicine  [facility]) were discussed and and offered to patient and he  agrees to start the journey:  A. Whole Foods, Plant-Based Nutrition comprising of fruits and vegetables, plant-based proteins, whole-grain carbohydrates was discussed in detail with the patient.   A list for source of those nutrients were also provided to the patient.  Patient will use only water or unsweetened tea for hydration. B.  The need to stay away from risky substances including alcohol, smoking; obtaining 7  to 9 hours of restorative sleep, at least 150 minutes of moderate intensity exercise weekly, the importance of healthy social connections,  and stress management techniques were discussed. C.  A full color page of  Calorie density of various food groups per pound showing examples of each food groups was provided to the patient.   - he will be scheduled with Norm Salt, RDN, CDE for individualized diabetes education.  - I have approached him with the following individualized plan to manage  his diabetes and patient agrees:   -In light of his presentation with presentation with improved glycemic profile, he would benefit from de-escalation his medications especially insulin.   -He is advised to continue Semglee at 35 units nightly, lower Humalog to 5-8   units 3 times a day with meals  for pre-meal BG readings of 90-150mg /dl, plus patient specific correction dose for unexpected hyperglycemia above 150mg /dl, advised to resume and continue his CGM device.   - he is warned not to take insulin without proper monitoring per orders. - Adjustment parameters are given to him for hypo and hyperglycemia in writing. - he is encouraged to call clinic for blood glucose levels less than 70 or above 300 mg /dl. -He will continue to benefit from low-dose SGLT2 inhibitors.  Advised to continue Jardiance 10 mg p.o. daily at breakfast.    - he will be  considered for incretin therapy as appropriate next visit.  - Specific targets for  A1c;  LDL, HDL,  and Triglycerides were discussed with the patient.  2) Blood Pressure /Hypertension:   -His blood pressure is controlled to target.  he is advised to continue his current medications including spironolactone 25 mg p.o. daily at breakfast.  He is also on Lasix 40 mg p.o. daily in the morning and 60 mg p.o. daily in the evening.  3) Lipids/Hyperlipidemia:   Review of his recent lipid panel showed uncontrolled  LDL at 130 .  he  is advised to continue Lipitor 40 mg p.o. daily at bedtime.   Side effects and precautions discussed with him.  4)  Weight/Diet:  Body mass index is 27.62 kg/m.  -   clearly complicating his diabetes care.   he is  a candidate for weight loss. I discussed with him the fact that loss of 5 - 10% of his  current body weight will have the most impact on his diabetes management.  The above detailed  ACLM recommendations for nutrition, exercise, sleep, social life, avoidance of risky substances, the need for restorative sleep   information will also detailed on discharge instructions.  5) hypothyroidism: The circumstance of his diagnosis are not available today.  His advised to continue levothyroxine 25 mcg p.o. daily before breakfast.    - We discussed about the correct intake of his thyroid hormone, on empty stomach at fasting, with water, separated by at least 30 minutes from breakfast and other medications,  and separated by more than 4 hours from calcium, iron, multivitamins, acid reflux medications (PPIs). -Patient is made aware of the fact that thyroid hormone replacement is needed for life, dose to be adjusted by periodic monitoring of thyroid function tests.   6) Chronic Care/Health Maintenance:  -he  10 on Statin medications and  is encouraged to initiate and continue to follow up with Ophthalmology, Dentist,  Podiatrist at least yearly or according to recommendations,  and advised to   stay away from smoking. I have recommended yearly flu vaccine and pneumonia vaccine at least every  5 years; moderate intensity exercise for up to 150 minutes weekly; and  sleep for 7- 9 hours a day.  - he is  advised to maintain close follow up with Marylynn Pearson, FNP for primary care needs, as well as his other providers for optimal and coordinated care.  I spent  26  minutes in the care of the patient today including review of labs from CMP, Lipids, Thyroid Function, Hematology (current and previous including abstractions from other facilities); face-to-face time discussing  his blood glucose readings/logs, discussing hypoglycemia and hyperglycemia episodes and symptoms, medications doses, his options of short and Wheat term treatment based on the latest standards of care / guidelines;  discussion about incorporating lifestyle medicine;  and documenting the encounter. Risk reduction counseling performed per USPSTF guidelines to reduce  cardiovascular risk factors.     Please refer to Patient Instructions for Blood Glucose Monitoring and Insulin/Medications Dosing Guide"  in media tab for additional information. Please  also refer to " Patient Self Inventory" in the Media  tab for reviewed elements of pertinent patient history.  Sheppard Plumber Dosh participated in the discussions, expressed understanding, and voiced agreement with the above plans.  All questions were answered to his satisfaction. he is encouraged to contact clinic should he have any questions or concerns prior to his return visit.    Follow up plan: - Return in about 4 months (around 11/15/2023) for Bring Meter/CGM Device/Logs- A1c in Office.  Marquis Lunch, MD [facility] [address] [place], [place] [address] Phone: [phone]  Fax: [phone]    07/18/2023, 9:49 AM  This note was partially dictated with voice recognition software. Similar sounding words  can be transcribed inadequately or may not  be corrected upon review.

## 2023-07-18 NOTE — Progress Notes (Signed)
Medical Nutrition Therapy  Appointment Start time:  0930  Appointment End time:  1030 Reason for visit Type 2 DM Referral diagnosis: E11.8 Preferred learning style: See  Learning readiness: Change in progress    NUTRITION ASSESSMENT  62 yr old bmale here for Type 2 DM. Sees Dr. Fransico Him, endocrinology. Started going to gym last weekend and feels a lot beter. Changes made:  Eating more fish and vegetables and fruit. A1C better- down to 8% from 11% Has had some low blood sugars. Keep candy or something to help bring it up.  Dr. Fransico Him made adjustments in his insulin. He went back to taking his meal time insulin due to his BS being high. Takes 5 units with meals plus sliding scale. Takes 30-35 units of Semglee at night.   Reviewed need for 45-60 grams of carbs per meal and reviewed meal planning. Pt. Verbalized understanding to stop meal time insulin and eat more carbs and protein with meal.  Clinical Medical Hx: Past Medical History:  Diagnosis Date   Acute kidney injury (HCC)    Acute respiratory failure (HCC)    Atrial flutter (HCC)    on Eliquis   CHF (congestive heart failure) (HCC)    EF 20-25% 10/15/21, AICD in place.   Coronary artery disease    Diabetes mellitus without complication (HCC)    DVT (deep venous thrombosis) (HCC) 06/2019   Heart murmur    Hypertension    Presence of permanent cardiac pacemaker    Renal disorder     Medications:  Current Outpatient Medications on File Prior to Visit  Medication Sig Dispense Refill   acetaminophen (TYLENOL) 325 MG tablet Take 1-2 tablets (325-650 mg total) by mouth every 4 (four) hours as needed for mild pain.     atorvastatin (LIPITOR) 20 MG tablet Take 1 tablet (20 mg total) by mouth daily. 90 tablet 3   Calcium Carb-Cholecalciferol 600-10 MG-MCG TABS Take 1 tablet by mouth daily with breakfast. 90 tablet 3   Continuous Glucose Receiver (FREESTYLE LIBRE 3 READER) DEVI 1 each by Does not apply route daily. 2 each 3    Continuous Glucose Sensor (FREESTYLE LIBRE 3 SENSOR) MISC PLACE 1 SENSOR ON THE SKIN EVERY 14 DAYS 2 each 3   digoxin (LANOXIN) 0.125 MG tablet Take 0.5 tablets (0.0625 mg total) by mouth daily. 30 tablet 5   empagliflozin (JARDIANCE) 10 MG TABS tablet Take 1 tablet (10 mg total) by mouth daily. 30 tablet 6   Fe Fum-Vit C-Vit B12-FA (TRIGELS-F FORTE) CAPS capsule Take 1 capsule by mouth daily after breakfast. (Patient not taking: Reported on 07/09/2023) 30 capsule 6   insulin glargine (SEMGLEE, YFGN,) 100 UNIT/ML Solostar Pen Inject 35 Units into the skin at bedtime. 30 mL 1   insulin lispro (HUMALOG KWIKPEN) 100 UNIT/ML KwikPen Inject 5-8 Units into the skin 3 (three) times daily with meals.     Insulin Pen Needle (PEN NEEDLES) 32G X 4 MM MISC Use with insulin pen with breakfast, with lunch, and with evening meal. 100 each 0   levothyroxine (SYNTHROID) 25 MCG tablet Take 1 tablet (25 mcg total) by mouth daily before breakfast. 90 tablet 3   magnesium oxide (MAG-OX) 400 (240 Mg) MG tablet Take 1 tablet (400 mg total) by mouth 2 (two) times daily. 180 tablet 3   Multiple Vitamin (MULTIVITAMIN WITH MINERALS) TABS tablet Take 1 tablet by mouth daily.     pantoprazole (PROTONIX) 40 MG tablet Take 1 tablet (40 mg total) by mouth daily.  90 tablet 3   potassium chloride SA (KLOR-CON M) 20 MEQ tablet Take 2 tablets (40 mEq total) by mouth daily. 180 tablet 3   sertraline (ZOLOFT) 50 MG tablet Take 1 tablet (50 mg total) by mouth daily. 90 tablet 3   sildenafil (REVATIO) 20 MG tablet TAKE 1 TABLET(20 MG) BY MOUTH THREE TIMES DAILY 90 tablet 6   spironolactone (ALDACTONE) 25 MG tablet Take 0.5 tablets (12.5 mg total) by mouth at bedtime. 45 tablet 3   tiotropium (SPIRIVA HANDIHALER) 18 MCG inhalation capsule Place 1 capsule (18 mcg total) into inhaler and inhale daily. 30 capsule 12   torsemide (DEMADEX) 20 MG tablet Take 2 tablets (40 mg total) by mouth every morning AND 3 tablets (60 mg total) every evening.  Take 3 tablets (60 mg) daily. 150 tablet 6   warfarin (COUMADIN) 3 MG tablet Take 6 mg (2 tablets) every Tuesday/Thursday/Saturday and 3 mg (1 tablet) all other days or as directed by heart failure clinic 150 tablet 3   No current facility-administered medications on file prior to visit.    Labs:  This SmartLink has not been configured with any valid records.      Latest Ref Rng & Units 07/09/2023    8:58 AM 05/29/2023    1:55 PM 05/14/2023   10:14 AM  CMP  Glucose 70 - 99 mg/dL 962  952  88   BUN 6 - 20 mg/dL 25  28  28    Creatinine 0.61 - 1.24 mg/dL 8.41  3.24  4.01   Sodium 135 - 145 mmol/L 132  135  137   Potassium 3.5 - 5.1 mmol/L 3.8  4.6  4.0   Chloride 98 - 111 mmol/L 96  98  97   CO2 22 - 32 mmol/L 27  27  27    Calcium 8.9 - 10.3 mg/dL 9.6  9.1  9.6   Total Protein 6.5 - 8.1 g/dL 8.2     Total Bilirubin <1.2 mg/dL 0.9     Alkaline Phos 38 - 126 U/L 164     AST 15 - 41 U/L 31     ALT 0 - 44 U/L 27     Lipid Panel     Component Value Date/Time   CHOL 212 (H) 06/27/2022 1825   TRIG 134 06/27/2022 1825   HDL 55 06/27/2022 1825   CHOLHDL 3.9 06/27/2022 1825   VLDL 27 06/27/2022 1825   LDLCALC 130 (H) 06/27/2022 1825   Lab Results  Component Value Date   HGBA1C 8.0 (A) 07/18/2023     Notable Signs/Symptoms: has had quite a few low blood sugars recently.  Lifestyle & Dietary Hx Lives by himself. Meals mostly eaten at home.  Estimated daily fluid intake: 40 oz Supplements: MVI Sleep: good Stress / self-care: none Current average weekly physical activity: adl  24-Hr Dietary Recall First Meal: 1 packet of oatmeal and blueberries Snack:  Second Meal: usually eating dried beans, vegetables and fruits. Snack: only if BS is low-fruit and pb Third Meal:  usually eating dried beans, vegetables and fruits. Snack: only is BS is low- fruit and PB Beverages: water  Estimated Energy Needs Calories: 2000-2200 Carbohydrate: 200g Protein: 135g Fat:  50g   NUTRITION DIAGNOSIS  NB-1.1 Food and nutrition-related knowledge deficit As related to DIabetes Type 2 .  As evidenced by A1C 11.4%.   NUTRITION INTERVENTION  Nutrition education (E-1) on the following topics:  Nutrition and Diabetes education provided on My Plate, CHO counting, meal planning,  portion sizes, timing of meals, avoiding snacks between meals unless having a low blood sugar, target ranges for A1C and blood sugars, signs/symptoms and treatment of hyper/hypoglycemia, monitoring blood sugars, taking medications as prescribed, benefits of exercising 30 minutes per day and prevention of complications of DM. Lifestyle Medicine  - Whole Food, Plant Predominant Nutrition is highly recommended: Eat Plenty of vegetables, Mushrooms, fruits, Legumes, Whole Grains, Nuts, seeds in lieu of processed meats, processed snacks/pastries red meat, poultry, eggs.    -It is better to avoid simple carbohydrates including: Cakes, Sweet Desserts, Ice Cream, Soda (diet and regular), Sweet Tea, Candies, Chips, Cookies, Store Bought Juices, Alcohol in Excess of  1-2 drinks a day, Lemonade,  Artificial Sweeteners, Doughnuts, Coffee Creamers, "Sugar-free" Products, etc, etc.  This is not a complete list.....  Exercise: If you are able: 30 -60 minutes a day ,4 days a week, or 150 minutes a week.  The longer the better.  Combine stretch, strength, and aerobic activities.  If you were told in the past that you have high risk for cardiovascular diseases, you may seek evaluation by your heart doctor prior to initiating moderate to intense exercise programs.   Handouts Provided Include  .SIgns/treatment  of low blood sugars My Plate   Learning Style & Readiness for Change Teaching method utilized: Visual & Auditory  Demonstrated degree of understanding via: Teach Back  Barriers to learning/adherence to lifestyle change: none  Goals Established by Pt Keep up the great job! Keep up the great job. Notice  the change in meal time insulin scale adjustments.   MONITORING & EVALUATION Dietary intake, weekly physical activity, and BS  in 3 month.  Next Steps  Patient is to stop meal time insulin and eat three balanced meals as discussed. Marland Kitchen

## 2023-07-18 NOTE — Patient Instructions (Signed)
Keep up the great job. Notice the change in meal time insulin scale adjustments.

## 2023-07-24 ENCOUNTER — Ambulatory Visit (HOSPITAL_COMMUNITY): Payer: Self-pay | Admitting: Pharmacist

## 2023-07-24 LAB — POCT INR: INR: 2.7 (ref 2.0–3.0)

## 2023-07-27 ENCOUNTER — Other Ambulatory Visit (HOSPITAL_COMMUNITY): Payer: Self-pay

## 2023-07-27 DIAGNOSIS — Z7901 Long term (current) use of anticoagulants: Secondary | ICD-10-CM

## 2023-07-27 DIAGNOSIS — Z95811 Presence of heart assist device: Secondary | ICD-10-CM

## 2023-07-30 ENCOUNTER — Other Ambulatory Visit: Payer: Self-pay

## 2023-07-30 MED ORDER — LANTUS SOLOSTAR 100 UNIT/ML ~~LOC~~ SOPN: 35.0000 [IU] | PEN_INJECTOR | Freq: Every day | SUBCUTANEOUS | 2 refills | Status: DC

## 2023-07-31 ENCOUNTER — Ambulatory Visit (HOSPITAL_COMMUNITY): Payer: Self-pay | Admitting: Pharmacist

## 2023-07-31 LAB — POCT INR
INR: 2 (ref 2.0–3.0)
INR: 2.7 (ref 2.0–3.0)

## 2023-08-06 ENCOUNTER — Ambulatory Visit (INDEPENDENT_AMBULATORY_CARE_PROVIDER_SITE_OTHER): Payer: 59

## 2023-08-06 DIAGNOSIS — I509 Heart failure, unspecified: Secondary | ICD-10-CM

## 2023-08-06 DIAGNOSIS — I480 Paroxysmal atrial fibrillation: Secondary | ICD-10-CM | POA: Diagnosis not present

## 2023-08-06 LAB — CUP PACEART REMOTE DEVICE CHECK
Battery Remaining Longevity: 84 mo
Battery Voltage: 2.99 V
Brady Statistic AP VP Percent: 0.01 %
Brady Statistic AP VS Percent: 9.61 %
Brady Statistic AS VP Percent: 0.01 %
Brady Statistic AS VS Percent: 90.37 %
Brady Statistic RA Percent Paced: 9.56 %
Brady Statistic RV Percent Paced: 0.02 %
Date Time Interrogation Session: 20241215042403
HighPow Impedance: 76 Ohm
Implantable Lead Connection Status: 753985
Implantable Lead Implant Date: 20210929
Implantable Lead Location: 753860
Implantable Pulse Generator Implant Date: 20210929
Lead Channel Impedance Value: 342 Ohm
Lead Channel Impedance Value: 418 Ohm
Lead Channel Impedance Value: 456 Ohm
Lead Channel Pacing Threshold Amplitude: 0.625 V
Lead Channel Pacing Threshold Amplitude: 2.5 V
Lead Channel Pacing Threshold Pulse Width: 0.4 ms
Lead Channel Pacing Threshold Pulse Width: 0.4 ms
Lead Channel Sensing Intrinsic Amplitude: 4.125 mV
Lead Channel Sensing Intrinsic Amplitude: 4.125 mV
Lead Channel Sensing Intrinsic Amplitude: 5.375 mV
Lead Channel Sensing Intrinsic Amplitude: 5.375 mV
Lead Channel Setting Pacing Amplitude: 1.5 V
Lead Channel Setting Pacing Amplitude: 3 V
Lead Channel Setting Pacing Pulse Width: 1 ms
Lead Channel Setting Sensing Sensitivity: 0.3 mV
Zone Setting Status: 755011
Zone Setting Status: 755011

## 2023-08-14 ENCOUNTER — Ambulatory Visit (HOSPITAL_COMMUNITY): Payer: Self-pay | Admitting: Pharmacist

## 2023-08-14 LAB — POCT INR: INR: 2.4 (ref 2.0–3.0)

## 2023-08-17 ENCOUNTER — Telehealth (HOSPITAL_COMMUNITY): Payer: Self-pay

## 2023-08-17 NOTE — Telephone Encounter (Signed)
[place] RN from [facility] team called to let us know that He is now listed on the transplant waiting list and he is listed as a status 4    CB# [phone] [place] C.

## 2023-08-19 ENCOUNTER — Other Ambulatory Visit: Payer: Self-pay | Admitting: Physical Medicine and Rehabilitation

## 2023-08-21 ENCOUNTER — Ambulatory Visit (HOSPITAL_COMMUNITY): Payer: Self-pay | Admitting: Pharmacist

## 2023-08-21 LAB — POCT INR: INR: 2.8 (ref 2.0–3.0)

## 2023-08-27 ENCOUNTER — Ambulatory Visit (HOSPITAL_COMMUNITY): Payer: Self-pay | Admitting: Pharmacist

## 2023-08-27 LAB — POCT INR: INR: 2.3 (ref 2.0–3.0)

## 2023-08-31 ENCOUNTER — Other Ambulatory Visit (HOSPITAL_COMMUNITY): Payer: Self-pay | Admitting: *Deleted

## 2023-08-31 DIAGNOSIS — Z95811 Presence of heart assist device: Secondary | ICD-10-CM

## 2023-08-31 DIAGNOSIS — I509 Heart failure, unspecified: Secondary | ICD-10-CM

## 2023-08-31 MED ORDER — PANTOPRAZOLE SODIUM 40 MG PO TBEC: 40.0000 mg | DELAYED_RELEASE_TABLET | Freq: Every day | ORAL | 3 refills | Status: DC

## 2023-09-03 ENCOUNTER — Ambulatory Visit (HOSPITAL_COMMUNITY): Payer: Self-pay | Admitting: Pharmacist

## 2023-09-03 LAB — POCT INR: INR: 3.8 — AB (ref 2.0–3.0)

## 2023-09-04 ENCOUNTER — Other Ambulatory Visit (HOSPITAL_COMMUNITY): Payer: Self-pay | Admitting: *Deleted

## 2023-09-04 DIAGNOSIS — Z95811 Presence of heart assist device: Secondary | ICD-10-CM

## 2023-09-04 DIAGNOSIS — I509 Heart failure, unspecified: Secondary | ICD-10-CM

## 2023-09-04 DIAGNOSIS — Z7901 Long term (current) use of anticoagulants: Secondary | ICD-10-CM

## 2023-09-05 ENCOUNTER — Other Ambulatory Visit (HOSPITAL_COMMUNITY): Payer: Self-pay | Admitting: *Deleted

## 2023-09-05 ENCOUNTER — Ambulatory Visit (HOSPITAL_COMMUNITY)
Admission: RE | Admit: 2023-09-05 | Discharge: 2023-09-05 | Disposition: A | Discharge status: Home / Self Care | Payer: 59 | Source: Ambulatory Visit | Attending: Cardiology | Admitting: Cardiology

## 2023-09-05 ENCOUNTER — Ambulatory Visit (HOSPITAL_COMMUNITY): Payer: Self-pay | Admitting: Pharmacist

## 2023-09-05 ENCOUNTER — Other Ambulatory Visit (HOSPITAL_COMMUNITY): Payer: Self-pay | Admitting: Cardiology

## 2023-09-05 DIAGNOSIS — I4891 Unspecified atrial fibrillation: Secondary | ICD-10-CM | POA: Diagnosis present

## 2023-09-05 DIAGNOSIS — I509 Heart failure, unspecified: Secondary | ICD-10-CM

## 2023-09-05 DIAGNOSIS — Z86718 Personal history of other venous thrombosis and embolism: Secondary | ICD-10-CM | POA: Diagnosis not present

## 2023-09-05 DIAGNOSIS — Z95811 Presence of heart assist device: Secondary | ICD-10-CM

## 2023-09-05 DIAGNOSIS — Z79890 Hormone replacement therapy (postmenopausal): Secondary | ICD-10-CM | POA: Insufficient documentation

## 2023-09-05 DIAGNOSIS — E039 Hypothyroidism, unspecified: Secondary | ICD-10-CM | POA: Diagnosis not present

## 2023-09-05 DIAGNOSIS — J449 Chronic obstructive pulmonary disease, unspecified: Secondary | ICD-10-CM | POA: Diagnosis not present

## 2023-09-05 DIAGNOSIS — Z7901 Long term (current) use of anticoagulants: Secondary | ICD-10-CM | POA: Insufficient documentation

## 2023-09-05 DIAGNOSIS — I272 Pulmonary hypertension, unspecified: Secondary | ICD-10-CM | POA: Diagnosis not present

## 2023-09-05 DIAGNOSIS — D649 Anemia, unspecified: Secondary | ICD-10-CM | POA: Diagnosis not present

## 2023-09-05 DIAGNOSIS — I4892 Unspecified atrial flutter: Secondary | ICD-10-CM | POA: Diagnosis present

## 2023-09-05 DIAGNOSIS — Z79899 Other long term (current) drug therapy: Secondary | ICD-10-CM | POA: Insufficient documentation

## 2023-09-05 DIAGNOSIS — I5022 Chronic systolic (congestive) heart failure: Secondary | ICD-10-CM | POA: Diagnosis not present

## 2023-09-05 DIAGNOSIS — N183 Chronic kidney disease, stage 3 unspecified: Secondary | ICD-10-CM | POA: Insufficient documentation

## 2023-09-05 DIAGNOSIS — I428 Other cardiomyopathies: Secondary | ICD-10-CM | POA: Insufficient documentation

## 2023-09-05 DIAGNOSIS — Z7682 Awaiting organ transplant status: Secondary | ICD-10-CM | POA: Diagnosis not present

## 2023-09-05 DIAGNOSIS — Z7984 Long term (current) use of oral hypoglycemic drugs: Secondary | ICD-10-CM | POA: Diagnosis not present

## 2023-09-05 DIAGNOSIS — E1122 Type 2 diabetes mellitus with diabetic chronic kidney disease: Secondary | ICD-10-CM | POA: Insufficient documentation

## 2023-09-05 LAB — BASIC METABOLIC PANEL
Anion gap: 13 (ref 5–15)
BUN: 18 mg/dL (ref 6–20)
CO2: 26 mmol/L (ref 22–32)
Calcium: 9.4 mg/dL (ref 8.9–10.3)
Chloride: 96 mmol/L — ABNORMAL LOW (ref 98–111)
Creatinine, Ser: 1.23 mg/dL (ref 0.61–1.24)
GFR, Estimated: >60 mL/min (ref >60)
Glucose, Bld: 133 mg/dL — ABNORMAL HIGH (ref 70–99)
Potassium: 4 mmol/L (ref 3.5–5.1)
Sodium: 135 mmol/L (ref 135–145)

## 2023-09-05 LAB — CBC
HCT: 43.4 % (ref 39.0–52.0)
Hemoglobin: 13.2 g/dL (ref 13.0–17.0)
MCH: 21.2 pg — ABNORMAL LOW (ref 26.0–34.0)
MCHC: 30.4 g/dL (ref 30.0–36.0)
MCV: 69.8 fL — ABNORMAL LOW (ref 80.0–100.0)
Platelets: 333 10*3/uL (ref 150–400)
RBC: 6.22 MIL/uL — ABNORMAL HIGH (ref 4.22–5.81)
RDW: 22.1 % — ABNORMAL HIGH (ref 11.5–15.5)
WBC: 7 10*3/uL (ref 4.0–10.5)
nRBC: 0 % (ref 0.0–0.2)

## 2023-09-05 LAB — PROTIME-INR
INR: 1.5 — ABNORMAL HIGH (ref 0.8–1.2)
Prothrombin Time: 18.2 s — ABNORMAL HIGH (ref 11.4–15.2)

## 2023-09-05 LAB — LACTATE DEHYDROGENASE: LDH: 231 U/L — ABNORMAL HIGH (ref 98–192)

## 2023-09-05 LAB — MAGNESIUM: Magnesium: 2.5 mg/dL — ABNORMAL HIGH (ref 1.7–2.4)

## 2023-09-05 LAB — DIGOXIN LEVEL: Digoxin Level: 0.2 ng/mL — ABNORMAL LOW (ref 0.8–2.0)

## 2023-09-05 MED ORDER — SPIRONOLACTONE 25 MG PO TABS: 25.0000 mg | ORAL_TABLET | Freq: Every day | ORAL | 3 refills | Status: DC

## 2023-09-05 MED ORDER — POTASSIUM CHLORIDE CRYS ER 20 MEQ PO TBCR: 40.0000 meq | EXTENDED_RELEASE_TABLET | Freq: Every day | ORAL | 3 refills | Status: DC

## 2023-09-05 MED ORDER — TORSEMIDE 20 MG PO TABS: 60.0000 mg | ORAL_TABLET | Freq: Two times a day (BID) | ORAL | 10 refills | Status: AC

## 2023-09-05 NOTE — Patient Instructions (Addendum)
  Tomorrow (Thursday) only- Take Torsemide  60 mg (3 tablets) and 80 mg (4 tablets) in the evening  Starting on Friday- Take Torsemide  60 mg (3 tablets) in the morning and 60 mg (3 tablets) in the evening.  Increase Spironolactone  to 25 mg (1 tablet) at bedtime  Coumadin  dosing per Barbra Ley PharmD Return to clinic in 1 month for follow up with Dr Mitzie Anda Go to [facility] on Monday 09/17/23 for repeat BMET If shortness of breath does not improve call/page VAD coordinators

## 2023-09-05 NOTE — Progress Notes (Addendum)
 Pt presents for 2 month f/u  in VAD clinic today alone. Denies issues with VAD equipment or drive line.   Pt walked into clinic independently. States he is overall feeling well. Denies lightheadedness, falls, and signs of bleeding (except occasional blood with wiping- hemorrhoids). Reports one episode of dizziness recently when he stood up to get out of his car. This resolved quickly. 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  Reports increased shortness of breath with activity that started last week. Abdominal edema present. Per Dr Mitzie Anda will increase Spironolactone  to 25 mg daily and increase Torsemide  to 60 mg BID. Per Dr Mitzie Anda- tomorrow only pt to take Torsemide  60 mg in AM, and 80 mg in PM. Will plan to get repeat BMET in 10 days. Updated prescriptions sent to pt's pharmacy. Reviewed medication instructions with pt- also clearly printed on AVS. He verbalized understanding.   Pt reports intermittent tenderness at exit site. Denies trauma. Dressing changed today - see below.   Pt endorses compliance in checking his blood sugars- currently wearing Freestyle Libre. He is following up with endocrinology regularly and is working with registered dietitian to help blood sugar control.   Listed for transplant Status 4 at [facility]. He has follow up with them 11/15/23.   [facility] requesting for pt to have RHC done locally in the next 3 - 6 months. Discussed with Dr Mitzie Anda. Will plan a date for procedure at pt's next follow up appt.   Vital Signs:  Temp: 98.2 Doppler: 112     Automatic BP: 93/74 (81)   HR: 82 NSR     SpO2: 97% on RA  Weight: 195.2 lb w/ equip Last weight: 199.2 lb w/ equip  Destination Therapy due to uncontrolled diabetes   LVAD assessment: HM III:  VAD Speed: 5400 rpms Flow: 4.7     Power: 4.2 w       PI: 3.0     Hct: 30   Alarms: none Events: 0-5 PI events daily  Fixed speed: 5400 Low speed limit: 5100  Primary controller: back up battery due for replacement in  13 months Secondary controller: back up battery due for replacement in  months  I reviewed the LVAD parameters from today and compared the results to the patient's prior recorded data. LVAD interrogation was NEGATIVE for significant power changes, NEGATIVE for clinical alarms and STABLE for PI events/speed drops. No programming changes were made and pump is functioning within specified parameters. Pt is performing daily controller and system monitor self tests along with completing weekly and monthly maintenance for LVAD equipment.   LVAD equipment check completed and is in good working order. Back-up equipment not present today.   Annual Equipment Maintenance on UBC/PM was performed on 06/2023.   Exit Site Care: Existing VAD dressing removed and site care performed using sterile technique. Drive line exit site cleaned with Chlora prep applicators x 2, rinsed with saline, allowed to dry, and Sorbaview dressing with Silverlon patch applied. Exit site healed and incorporated , velour is fully implanted at exit site. No redness, tenderness, drainage, foul odor or rash noted. Drive line anchor re-applied. Pt denies fever or chills. Pt given 8 weekly kits and 8 extra anchors for home use.     BP & Labs:  Doppler 112 - Doppler is reflecting modified systolic   Hgb 13.2 - No S/S of bleeding. Specifically denies melena/BRBPR or nosebleeds.   LDH stable at 231 - Denies tea-colored urine. No power elevations noted on interrogation.  Patient Instructions: Tomorrow (Thursday) only- Take Torsemide  60 mg (3 tablets) and 80 mg (4 tablets) in the evening  Starting on Friday- Take Torsemide  60 mg (3 tablets) in the morning and 60 mg (3 tablets) in the evening.  Increase Spironolactone  to 25 mg (1 tablet) at bedtime  Coumadin  dosing per Barbra Ley PharmD Return to clinic in 1 month for follow up with Dr Mitzie Anda Go to [facility] on Monday 09/17/23 for repeat BMET If shortness of breath does not improve call/page  VAD coordinators   Paulo Bosworth VAD Coordinator  Office: [phone]  24/7 Pager: [phone]     ADVANCED HF/VAD CLINIC NOTE   Darryl Frye is a 63 y.o. with history of atrial fibrillation/flutter and nonischemic cardiomyopathy who was initially referred by Dr. Amanda Jungling for evaluation of CHF.  Patient was diagnosed with CHF in 2020 at [facility] in [place].  At the time, he drank heavily and used cocaine.  He stopped drinking, smoking, and cocaine in 2020 and has not used any of these substances since then. Echo in 11/20 showed EF < 10%, cath in 12/20 showed normal coronaries.  Echo has shown persistently low EF over time, echo in 2/23 showed EF 20-25%.  He has a Medtronic ICD.      Patient was admitted in 2/23 with atrial flutter/RVR. He was started on amiodarone  and converted to NSR.  He had atrial flutter ablation in 5/23 and amiodarone  was stopped  Echo was done in 11/23 showing elevated filling pressure and markedly low cardiac output.  Patient was admitted, started on milrinone , and underwent HM3 LVAD placement with LAA clipping later in 11/23.  Post-op course complicated by acute hypoxemic respiratory failure post-op and PNA.  Patient went to CIR after acute hospital course.   Ramp echo was in 4/24, at 5500 bpm, the IV septum was shifted mildly to the left with moderate RV dilation and dysfunction, AoV opened rarely.  Speed decreased to 5400 bpm, slight opening AoV every beat, trivial AI, midline IV septum.  Speed left at 5400 bpm.   Patient returns for LVAD followup.  Weight down 5 lbs.  He is doing custodial work part-time.  No dyspnea walking on flat ground but has noted more dyspnea with work recently.  He walks for 30 minutes on a treadmill at the gym.  No orthopnea/PND.  He had 1 episode of lightheadedness standing up to get out of the car recently but no syncope.  MAP 81.  NSR on telemetry today.   Labs (12/23): K 4.3, creatinine 1.25 => 1.45, Na 128, hgb 8.8 => 9.6, LDH 211, LFTs  normal, TSH normal, digoxin  0.8 Labs (1/24): K 3.9, creatinine 1.32, LDH 224, hgb 10.4 Labs (2/24): K 4.5, creatinine 1.33 Labs (4/24): creatinine 1.16, hgb 10.9 => 11 Labs (6/24): K 3.8, creatinine 1.32 Labs (7/24): K 4, creatinine 1.39, hgb 12.3, LDH 219 Labs (10/24): K 4.6, creatinine 1.25, hgb 12.1, LDH 183 Labs (12/24): K 4.3, creatinine 1.2, hgb 13.2  PMH: 1. Atrial fibrillation/flutter: Mainly flutter.  - 5/23 Atrial flutter ablation.  2. H/o DVT 3. Chronic systolic CHF: Nonischemic cardiomyopathy.  Medtronic ICD.  - Echo (11/20): EF < 10% - Cath (12/20): Normal coronaries.  - Echo (8/22): EF 30%, mildly decreased RV systolic function.  - Echo (2/23): EF 20-25%, normal RV.  - RHC (11/23): mean RA 14, PA 66/53 mean 54, mean PCWP 43, CI 1.62 F/1.31 T - HM3 LVAD with LAA clipping on 07/04/22.  - Ramp echo (2/24): Speed increased to  5500 rpm.  At this speed, IV septum was midline.  Aortic valve opened 1/3 beats.  Mild RV enlargement with moderate RV dysfunction.  - Ramp echo (3/24): Speed decreased back to 5400 rpm as the IV septum was shifted left.  Midline septum at 5400 rpm.  4. Prior cocaine abuse.  5. Prior heavy ETOH 6. COPD: Moderate obstruction on 11/23 PFTs. No longer smokes.  7. Type 2 diabetes   SH: Patient was a heavy drinker, abused cocaine, and smoked in the past but quit in 2020.  Lives in [place]. He does part-time custodial work.    FH: Mother with PPM, aunt with CHF.    VAD Indication: Destination Therapy due to uncontrolled diabetes   LVAD assessment: HM III: See LVAD nurse's note above.    I reviewed the LVAD parameters from today and compared the results to the patient's prior recorded data. LVAD interrogation was NEGATIVE for significant power changes, NEGATIVE for clinical alarms and STABLE for PI events/speed drops. No programming changes were made and pump is functioning within specified parameters. Pt is performing daily controller and system  monitor self tests along with completing weekly and monthly maintenance for LVAD equipment.   LVAD equipment check completed and is in good working order. Back-up equipment present. Charged back up battery and performed self-test on equipment.    Annual Equipment Maintenance on UBC/PM was performed on 06/2022.      ROS: All systems negative except as listed in HPI, PMH and Problem List.  Current Outpatient Medications  Medication Sig Dispense Refill   acetaminophen  (TYLENOL ) 325 MG tablet Take 1-2 tablets (325-650 mg total) by mouth every 4 (four) hours as needed for mild pain.     atorvastatin  (LIPITOR) 20 MG tablet Take 1 tablet (20 mg total) by mouth daily. 90 tablet 3   Calcium  Carb-Cholecalciferol  600-10 MG-MCG TABS Take 1 tablet by mouth daily with breakfast. 90 tablet 3   Continuous Glucose Receiver (FREESTYLE LIBRE 3 READER) DEVI 1 each by Does not apply route daily. 2 each 3   Continuous Glucose Sensor (FREESTYLE LIBRE 3 SENSOR) MISC PLACE 1 SENSOR ON THE SKIN EVERY 14 DAYS 2 each 3   digoxin  (LANOXIN ) 0.125 MG tablet Take 0.5 tablets (0.0625 mg total) by mouth daily. 30 tablet 5   empagliflozin  (JARDIANCE ) 10 MG TABS tablet Take 1 tablet (10 mg total) by mouth daily. 30 tablet 6   Fe Fum-Vit C-Vit B12-FA (TRIGELS-F FORTE) CAPS capsule Take 1 capsule by mouth daily after breakfast. 30 capsule 6   insulin  glargine (LANTUS  SOLOSTAR) 100 UNIT/ML Solostar Pen Inject 35 Units into the skin at bedtime. 15 mL 2   insulin  lispro (HUMALOG  KWIKPEN) 100 UNIT/ML KwikPen Inject 5-8 Units into the skin 3 (three) times daily with meals.     Insulin  Pen Needle (PEN NEEDLES) 32G X 4 MM MISC Use with insulin  pen with breakfast, with lunch, and with evening meal. 100 each 0   levothyroxine  (SYNTHROID ) 25 MCG tablet Take 1 tablet (25 mcg total) by mouth daily before breakfast. 90 tablet 3   magnesium  oxide (MAG-OX) 400 (240 Mg) MG tablet Take 1 tablet (400 mg total) by mouth 2 (two) times daily. 180  tablet 3   Multiple Vitamin (MULTIVITAMIN WITH MINERALS) TABS tablet Take 1 tablet by mouth daily.     pantoprazole  (PROTONIX ) 40 MG tablet Take 1 tablet (40 mg total) by mouth daily. 90 tablet 3   sertraline  (ZOLOFT ) 50 MG tablet Take 1 tablet (50 mg total)  by mouth daily. 90 tablet 3   sildenafil  (REVATIO ) 20 MG tablet TAKE 1 TABLET(20 MG) BY MOUTH THREE TIMES DAILY 90 tablet 6   tiotropium (SPIRIVA  HANDIHALER) 18 MCG inhalation capsule Place 1 capsule (18 mcg total) into inhaler and inhale daily. 30 capsule 12   warfarin (COUMADIN ) 3 MG tablet Take 6 mg (2 tablets) every Tuesday/Thursday/Saturday and 3 mg (1 tablet) all other days or as directed by heart failure clinic 150 tablet 3   potassium chloride  SA (KLOR-CON  M) 20 MEQ tablet Take 2 tablets (40 mEq total) by mouth daily. 180 tablet 3   spironolactone  (ALDACTONE ) 25 MG tablet Take 1 tablet (25 mg total) by mouth at bedtime. 90 tablet 3   torsemide  (DEMADEX ) 20 MG tablet Take 3 tablets (60 mg total) by mouth 2 (two) times daily. 180 tablet 10   No current facility-administered medications for this encounter.    Patient has no known allergies.  REVIEW OF SYSTEMS: All systems negative except as listed in HPI, PMH and Problem list.   LVAD INTERROGATION:  See LVAD nurse's note above.   I reviewed the LVAD parameters from today, and compared the results to the patient's prior recorded data.  No programming changes were made.  The LVAD is functioning within specified parameters.  The patient performs LVAD self-test daily.  LVAD interrogation was negative for any significant power changes, alarms or PI events/speed drops.  LVAD equipment check completed and is in good working order.  Back-up equipment present.   LVAD education done on emergency procedures and precautions and reviewed exit site care.    Vitals:   09/05/23 1032 09/05/23 1033  BP: (!) 112/0 93/74  Pulse: 82   Temp: 98.2 F (36.8 C)   TempSrc: Oral   SpO2: 97%    Vital  Signs:  BP 93/74 Comment: map 81  Pulse 82 Comment: SR  Temp 98.2 F (36.8 C) (Oral)   SpO2 97%  MAP 81 Physical Exam: General: Well appearing this am. NAD.  HEENT: Normal. Neck: Supple, JVP 9-10 cm. Carotids OK.  Cardiac:  Mechanical heart sounds with LVAD hum present.  Lungs:  CTAB, normal effort.  Abdomen:  NT, mild distention, no HSM. No bruits or masses. +BS  LVAD exit site: Well-healed and incorporated. Dressing dry and intact. No erythema or drainage. Stabilization device present and accurately applied. Driveline dressing changed daily per sterile technique. Extremities:  Warm and dry. No cyanosis, clubbing, rash, or edema.  Neuro:  Alert & oriented x 3. Cranial nerves grossly intact. Moves all 4 extremities w/o difficulty. Affect pleasant    ASSESSMENT AND PLAN:   1. Chronic systolic CHF: Nonischemic cardiomyopathy, diagnosed 2020.  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  Cath in 12/20 with no significant coronary disease.  Medtronic ICD. Echo in 2/23 showed EF 20-25% with normal RV.  RHC in 11/13 showed markedly elevated filling pressures, primarily pulmonary venous hypertension, low cardiac output, and low PAPI. Echo in 11/23 with EF <20%, mod RV dysfunction.  He was admitted and started on milrinone  with improvement in hemodynamics.  We proceeded with HM3 LVAD with LAA clip on 07/04/22.  Weight is down but more symptomatic (still NYHA class II).  Some volume overload on exam.   - Increase torsemide  to 60 qam/80 qpm x 1 day then 60 mg  bid.  BMET today and again in 10 days.  - Continue Jardiance  10 mg daily.  - Continue sildenafil  20 tid for RV.  - Increase spironolactone  to 25 mg daily.   - Continue digoxin  0.0625 daily, check level today.  - He is now listed for transplant at [facility].  2. Atrial  fibrillation/atrial flutter: H/o flutter ablation.  Had AF/RVR post-LVAD, went back into NSR.  He is now off amiodarone .  NSR today when hooked up to telemetry.  - Continue warfarin.  3. VAD management: s/p HM-3 VAD on 07/04/22. Ramp echo in 4/24, speed decreased to 5400 rpm.  Stable LVAD parameters.  - Now off ASA.  - Continue warfarin, INR goal 2-2.5. 4. H/o DVT: On anticoagulation.  5. CKD stage 3: On Jardiance .  - BMET today.  6. Hypothyroidism - On levothyroxine .  7. Anemia: post-op.  - CBC today.  8. COPD: Moderate on 11/23 PFTs.  Suspect this plays a role in his dyspnea.  - Continue Spiriva .     9. Diabetes: Endocrinology followup.                                                                                                                                                                                                             Followup in 1 month  Darryl Frye 09/05/2023

## 2023-09-06 ENCOUNTER — Other Ambulatory Visit (HOSPITAL_COMMUNITY): Payer: Self-pay | Admitting: Unknown Physician Specialty

## 2023-09-10 ENCOUNTER — Ambulatory Visit (HOSPITAL_COMMUNITY): Payer: Self-pay | Admitting: Pharmacist

## 2023-09-10 LAB — POCT INR: INR: 2.2 (ref 2.0–3.0)

## 2023-09-10 NOTE — Progress Notes (Signed)
Remote ICD transmission.   

## 2023-09-14 ENCOUNTER — Other Ambulatory Visit (HOSPITAL_COMMUNITY): Payer: Self-pay | Admitting: *Deleted

## 2023-09-14 DIAGNOSIS — I509 Heart failure, unspecified: Secondary | ICD-10-CM

## 2023-09-14 DIAGNOSIS — Z95811 Presence of heart assist device: Secondary | ICD-10-CM

## 2023-09-17 ENCOUNTER — Ambulatory Visit (HOSPITAL_COMMUNITY): Payer: Self-pay | Admitting: Pharmacist

## 2023-09-17 LAB — POCT INR: INR: 2 (ref 2.0–3.0)

## 2023-09-18 ENCOUNTER — Other Ambulatory Visit (HOSPITAL_COMMUNITY)
Admission: RE | Admit: 2023-09-18 | Discharge: 2023-09-18 | Disposition: A | Discharge status: Home / Self Care | Payer: 59 | Source: Ambulatory Visit | Attending: Cardiology | Admitting: Cardiology

## 2023-09-18 DIAGNOSIS — Z95811 Presence of heart assist device: Secondary | ICD-10-CM | POA: Insufficient documentation

## 2023-09-18 DIAGNOSIS — I509 Heart failure, unspecified: Secondary | ICD-10-CM | POA: Diagnosis present

## 2023-09-18 LAB — BASIC METABOLIC PANEL
Anion gap: 13 (ref 5–15)
BUN: 34 mg/dL — ABNORMAL HIGH (ref 6–20)
CO2: 26 mmol/L (ref 22–32)
Calcium: 9.5 mg/dL (ref 8.9–10.3)
Chloride: 95 mmol/L — ABNORMAL LOW (ref 98–111)
Creatinine, Ser: 1.35 mg/dL — ABNORMAL HIGH (ref 0.61–1.24)
GFR, Estimated: >60 mL/min (ref >60)
Glucose, Bld: 221 mg/dL — ABNORMAL HIGH (ref 70–99)
Potassium: 3.7 mmol/L (ref 3.5–5.1)
Sodium: 134 mmol/L — ABNORMAL LOW (ref 135–145)

## 2023-09-24 ENCOUNTER — Ambulatory Visit (HOSPITAL_COMMUNITY): Payer: Self-pay | Admitting: Pharmacist

## 2023-09-24 LAB — POCT INR: INR: 3 (ref 2.0–3.0)

## 2023-09-25 ENCOUNTER — Emergency Department (HOSPITAL_COMMUNITY): Payer: 59

## 2023-09-25 ENCOUNTER — Other Ambulatory Visit (HOSPITAL_COMMUNITY): Payer: Self-pay | Admitting: Cardiology

## 2023-09-25 ENCOUNTER — Other Ambulatory Visit: Payer: Self-pay

## 2023-09-25 ENCOUNTER — Emergency Department (HOSPITAL_COMMUNITY)
Admission: EM | Admit: 2023-09-25 | Discharge: 2023-09-25 | Disposition: A | Discharge status: Home / Self Care | Payer: 59 | Attending: Emergency Medicine | Admitting: Emergency Medicine

## 2023-09-25 DIAGNOSIS — Z7901 Long term (current) use of anticoagulants: Secondary | ICD-10-CM | POA: Diagnosis not present

## 2023-09-25 DIAGNOSIS — R55 Syncope and collapse: Secondary | ICD-10-CM | POA: Insufficient documentation

## 2023-09-25 DIAGNOSIS — Z95811 Presence of heart assist device: Secondary | ICD-10-CM

## 2023-09-25 DIAGNOSIS — I509 Heart failure, unspecified: Secondary | ICD-10-CM

## 2023-09-25 DIAGNOSIS — Z794 Long term (current) use of insulin: Secondary | ICD-10-CM | POA: Diagnosis not present

## 2023-09-25 DIAGNOSIS — H532 Diplopia: Secondary | ICD-10-CM | POA: Insufficient documentation

## 2023-09-25 DIAGNOSIS — F418 Other specified anxiety disorders: Secondary | ICD-10-CM

## 2023-09-25 LAB — CBC WITH DIFFERENTIAL/PLATELET
Abs Immature Granulocytes: 0.03 10*3/uL (ref 0.00–0.07)
Basophils Absolute: 0.1 10*3/uL (ref 0.0–0.1)
Basophils Relative: 1 %
Eosinophils Absolute: 0.1 10*3/uL (ref 0.0–0.5)
Eosinophils Relative: 1 %
HCT: 42 % (ref 39.0–52.0)
Hemoglobin: 12.4 g/dL — ABNORMAL LOW (ref 13.0–17.0)
Immature Granulocytes: 0 %
Lymphocytes Relative: 19 %
Lymphs Abs: 1.6 10*3/uL (ref 0.7–4.0)
MCH: 21.3 pg — ABNORMAL LOW (ref 26.0–34.0)
MCHC: 29.5 g/dL — ABNORMAL LOW (ref 30.0–36.0)
MCV: 72.3 fL — ABNORMAL LOW (ref 80.0–100.0)
Monocytes Absolute: 0.7 10*3/uL (ref 0.1–1.0)
Monocytes Relative: 8 %
Neutro Abs: 6.1 10*3/uL (ref 1.7–7.7)
Neutrophils Relative %: 71 %
Platelets: 334 10*3/uL (ref 150–400)
RBC: 5.81 MIL/uL (ref 4.22–5.81)
RDW: 21.7 % — ABNORMAL HIGH (ref 11.5–15.5)
WBC: 8.6 10*3/uL (ref 4.0–10.5)
nRBC: 0 % (ref 0.0–0.2)

## 2023-09-25 LAB — COMPREHENSIVE METABOLIC PANEL
ALT: 31 U/L (ref 0–44)
AST: 29 U/L (ref 15–41)
Albumin: 3.5 g/dL (ref 3.5–5.0)
Alkaline Phosphatase: 136 U/L — ABNORMAL HIGH (ref 38–126)
Anion gap: 12 (ref 5–15)
BUN: 22 mg/dL — ABNORMAL HIGH (ref 6–20)
CO2: 22 mmol/L (ref 22–32)
Calcium: 8.8 mg/dL — ABNORMAL LOW (ref 8.9–10.3)
Chloride: 98 mmol/L (ref 98–111)
Creatinine, Ser: 1.18 mg/dL (ref 0.61–1.24)
GFR, Estimated: >60 mL/min (ref >60)
Glucose, Bld: 95 mg/dL (ref 70–99)
Potassium: 3.8 mmol/L (ref 3.5–5.1)
Sodium: 132 mmol/L — ABNORMAL LOW (ref 135–145)
Total Bilirubin: 0.2 mg/dL (ref 0.0–1.2)
Total Protein: 6.8 g/dL (ref 6.5–8.1)

## 2023-09-25 LAB — PROTIME-INR
INR: 1.7 — ABNORMAL HIGH (ref 0.8–1.2)
Prothrombin Time: 20.2 s — ABNORMAL HIGH (ref 11.4–15.2)

## 2023-09-25 LAB — TROPONIN I (HIGH SENSITIVITY): Troponin I (High Sensitivity): 45 ng/L — ABNORMAL HIGH (ref <18)

## 2023-09-25 NOTE — ED Triage Notes (Signed)
 Pt to the ed from home where he had a syncopal episode while healthcare worker was at his home doing a health screening. Pt relays he was dizzy but remember the entire situation. Healthcare worker that witnessed event relays pt was unresponsive for about 2 mins. Pt denies current cp, sob, dizziness. Pt has LVAD device  in place.

## 2023-09-25 NOTE — Discharge Instructions (Addendum)
Return for any problem.  Follow-up closely tomorrow with your cardiology team.

## 2023-09-25 NOTE — ED Provider Notes (Signed)
 [place] EMERGENCY DEPARTMENT AT [facility] Provider Note   CSN: [id number] Arrival date & time: 09/25/23  1757     History  Chief Complaint  Patient presents with   Loss of Consciousness    Darryl Frye is a 63 y.o. male.  63 year old male with prior medical history as detailed below presents for evaluation.  Patient is an LVAD patient.  Patient reports that this afternoon he experienced approximately 30 minutes of dizziness.  He reports double vision as well.  Patient may have had brief period of unresponsiveness additionally.  He is not able to fully confirm or deny LOC on evaluation.  LVAD team at bedside to evaluate.  The history is provided by the patient and medical records.       Home Medications Prior to Admission medications   Medication Sig Start Date End Date Taking? Authorizing Provider  acetaminophen  (TYLENOL ) 325 MG tablet Take 1-2 tablets (325-650 mg total) by mouth every 4 (four) hours as needed for mild pain. 07/31/22   Setzer, Nena PARAS, PA-C  atorvastatin  (LIPITOR) 20 MG tablet Take 1 tablet (20 mg total) by mouth daily. 08/17/22   Rolan Ezra RAMAN, MD  Calcium  Carb-Cholecalciferol  600-10 MG-MCG TABS Take 1 tablet by mouth daily with breakfast. 08/17/22   Rolan Ezra RAMAN, MD  Continuous Glucose Receiver (FREESTYLE LIBRE 3 READER) DEVI 1 each by Does not apply route daily. 04/05/23   Lenis Ethelle ORN, MD  Continuous Glucose Sensor (FREESTYLE LIBRE 3 SENSOR) MISC PLACE 1 SENSOR ON THE SKIN EVERY 14 DAYS 08/20/23   Raulkar, Sven SQUIBB, MD  digoxin  (LANOXIN ) 0.125 MG tablet Take 0.5 tablets (0.0625 mg total) by mouth daily. 08/17/22   Rolan Ezra RAMAN, MD  empagliflozin  (JARDIANCE ) 10 MG TABS tablet Take 1 tablet (10 mg total) by mouth daily. 03/28/23   Rolan Ezra RAMAN, MD  Fe Fum-Vit C-Vit B12-FA (TRIGELS-F FORTE) CAPS capsule Take 1 capsule by mouth daily after breakfast. 08/17/22   Rolan Ezra RAMAN, MD  insulin  glargine (LANTUS  SOLOSTAR) 100  UNIT/ML Solostar Pen Inject 35 Units into the skin at bedtime. 07/30/23   Nida, Gebreselassie W, MD  insulin  lispro (HUMALOG  KWIKPEN) 100 UNIT/ML KwikPen Inject 5-8 Units into the skin 3 (three) times daily with meals.    [provider]  Insulin  Pen Needle (PEN NEEDLES) 32G X 4 MM MISC Use with insulin  pen with breakfast, with lunch, and with evening meal. 07/31/22   Setzer, Sandra J, PA-C  levothyroxine  (SYNTHROID ) 25 MCG tablet Take 1 tablet (25 mcg total) by mouth daily before breakfast. 08/17/22   Rolan Ezra RAMAN, MD  magnesium  oxide (MAG-OX) 400 (240 Mg) MG tablet Take 1 tablet (400 mg total) by mouth 2 (two) times daily. 08/17/22   Rolan Ezra RAMAN, MD  Multiple Vitamin (MULTIVITAMIN WITH MINERALS) TABS tablet Take 1 tablet by mouth daily. 07/31/22   Setzer, Sandra J, PA-C  pantoprazole  (PROTONIX ) 40 MG tablet Take 1 tablet (40 mg total) by mouth daily. 08/31/23   Rolan Ezra RAMAN, MD  potassium chloride  SA (KLOR-CON  M) 20 MEQ tablet Take 2 tablets (40 mEq total) by mouth daily. 09/05/23   Rolan Ezra RAMAN, MD  sertraline  (ZOLOFT ) 50 MG tablet Take 1 tablet (50 mg total) by mouth daily. 08/17/22   Rolan Ezra RAMAN, MD  sildenafil  (REVATIO ) 20 MG tablet TAKE 1 TABLET(20 MG) BY MOUTH THREE TIMES DAILY 07/12/23   Rolan Ezra RAMAN, MD  spironolactone  (ALDACTONE ) 25 MG tablet Take 1 tablet (25 mg total)  by mouth at bedtime. 09/05/23   Rolan Ezra RAMAN, MD  tiotropium (SPIRIVA  HANDIHALER) 18 MCG inhalation capsule Place 1 capsule (18 mcg total) into inhaler and inhale daily. 10/10/22   Rolan Ezra RAMAN, MD  torsemide  (DEMADEX ) 20 MG tablet Take 3 tablets (60 mg total) by mouth 2 (two) times daily. 09/05/23   Rolan Ezra RAMAN, MD  warfarin (COUMADIN ) 3 MG tablet TAKE 2 TABLETS BY MOUTH EVERY TUESDAY, THURSDAYS AND SATURDAY AND 1 TABLET ALL OTHER DAYS 09/06/23   Rolan Ezra RAMAN, MD      Allergies    Patient has no known allergies.    Review of Systems   Review of Systems  Cardiovascular:   Positive for syncope.  All other systems reviewed and are negative.   Physical Exam Updated Vital Signs There were no vitals taken for this visit. Physical Exam Vitals and nursing note reviewed.  Constitutional:      General: He is not in acute distress.    Appearance: Normal appearance. He is well-developed.  HENT:     Head: Normocephalic and atraumatic.  Eyes:     Conjunctiva/sclera: Conjunctivae normal.     Pupils: Pupils are equal, round, and reactive to light.  Cardiovascular:     Rate and Rhythm: Normal rate and regular rhythm.     Heart sounds: Normal heart sounds.  Pulmonary:     Effort: Pulmonary effort is normal. No respiratory distress.     Breath sounds: Normal breath sounds.  Abdominal:     General: There is no distension.     Palpations: Abdomen is soft.     Tenderness: There is no abdominal tenderness.  Musculoskeletal:        General: No deformity. Normal range of motion.     Cervical back: Normal range of motion and neck supple.  Skin:    General: Skin is warm and dry.  Neurological:     General: No focal deficit present.     Mental Status: He is alert and oriented to person, place, and time. Mental status is at baseline.     Cranial Nerves: No cranial nerve deficit.     Sensory: No sensory deficit.     Motor: No weakness.     Coordination: Coordination normal.     ED Results / Procedures / Treatments   Labs (all labs ordered are listed, but only abnormal results are displayed) Labs Reviewed - No data to display  EKG None  Radiology No results found.  Procedures Procedures    Medications Ordered in ED Medications - No data to display  ED Course/ Medical Decision Making/ A&P                                 Medical Decision Making Amount and/or Complexity of Data Reviewed Labs: ordered. Radiology: ordered.    Medical Screen Complete  This patient presented to the ED with complaint of near syncope, dizziness.  This complaint  involves an extensive number of treatment options. The initial differential diagnosis includes, but is not limited to, [place] abnormality, hypertension, LVAD dysfunction, CVA, TIA, etc.  This presentation is: Acute, Chronic, Self-Limited, Previously Undiagnosed, Uncertain Prognosis, Complicated, Systemic Symptoms, and Threat to Life/Bodily Function  Patient presents with transient dizziness, double vision, near syncope.  Symptoms resolved prior to arrival.  LVAD team does not feel that the device has abnormality.  Screening labs obtained are without significant abnormality.  Patient's INR is  slightly subtherapeutic 1.7.  Heart failure team will follow this up in the AM.  CT imaging is without acute abnormality.  Patient declines additional workup.  He desires discharge home.  He understands need for close outpatient follow-up.  Strict return precautions given and understood. Additional history obtained:  External records from outside sources obtained and reviewed including prior ED visits and prior Inpatient records.    Lab Tests:  I ordered and personally interpreted labs.  The pertinent results include: CBC, CMP, troponin, INR   Imaging Studies ordered:  I ordered imaging studies including CT head, chest x-ray I independently visualized and interpreted obtained imaging which showed NAD I agree with the radiologist interpretation.   Problem List / ED Course:  Dizziness, near syncope   Reevaluation:  After the interventions noted above, I reevaluated the patient and found that they have: Resolved   Disposition:  After consideration of the diagnostic results and the patients response to treatment, I feel that the patent would benefit from close outpatient follow-up.   CRITICAL CARE Performed by: Maude JAYSON Galloway   Total critical care time: 30 minutes  Critical care time was exclusive of separately billable procedures and treating other patients.  Critical care was  necessary to treat or prevent imminent or life-threatening deterioration.  Critical care was time spent personally by me on the following activities: development of treatment plan with patient and/or surrogate as well as nursing, discussions with consultants, evaluation of patient's response to treatment, examination of patient, obtaining history from patient or surrogate, ordering and performing treatments and interventions, ordering and review of laboratory studies, ordering and review of radiographic studies, pulse oximetry and re-evaluation of patient's condition.          Final Clinical Impression(s) / ED Diagnoses Final diagnoses:  Near syncope    Rx / DC Orders ED Discharge Orders     None         Galloway Maude JAYSON, MD 09/25/23 2340

## 2023-09-26 ENCOUNTER — Telehealth (HOSPITAL_COMMUNITY): Payer: Self-pay | Admitting: *Deleted

## 2023-09-26 ENCOUNTER — Other Ambulatory Visit (HOSPITAL_COMMUNITY): Payer: Self-pay | Admitting: *Deleted

## 2023-09-26 DIAGNOSIS — F418 Other specified anxiety disorders: Secondary | ICD-10-CM

## 2023-09-26 DIAGNOSIS — I509 Heart failure, unspecified: Secondary | ICD-10-CM

## 2023-09-26 DIAGNOSIS — Z95811 Presence of heart assist device: Secondary | ICD-10-CM

## 2023-09-26 MED ORDER — SERTRALINE HCL 50 MG PO TABS: 50.0000 mg | ORAL_TABLET | Freq: Every day | ORAL | 3 refills | Status: DC

## 2023-09-26 NOTE — Telephone Encounter (Signed)
 Attempted to contact pt following ER visit last night to see how he is feeling. Pt did not answer, left voicemail requesting call back.   Paulo Bosworth RN VAD Coordinator  Office: [phone]  24/7 Pager: [phone]

## 2023-09-27 ENCOUNTER — Other Ambulatory Visit (HOSPITAL_COMMUNITY): Payer: Self-pay | Admitting: *Deleted

## 2023-09-27 DIAGNOSIS — Z95811 Presence of heart assist device: Secondary | ICD-10-CM

## 2023-09-27 DIAGNOSIS — I509 Heart failure, unspecified: Secondary | ICD-10-CM

## 2023-09-27 MED ORDER — DIGOXIN 125 MCG PO TABS: 0.0625 mg | ORAL_TABLET | Freq: Every day | ORAL | 5 refills | Status: DC

## 2023-10-01 ENCOUNTER — Other Ambulatory Visit: Payer: Self-pay

## 2023-10-01 ENCOUNTER — Ambulatory Visit (HOSPITAL_COMMUNITY): Payer: Self-pay | Admitting: Pharmacist

## 2023-10-01 ENCOUNTER — Other Ambulatory Visit (HOSPITAL_COMMUNITY): Payer: Self-pay

## 2023-10-01 DIAGNOSIS — I509 Heart failure, unspecified: Secondary | ICD-10-CM

## 2023-10-01 DIAGNOSIS — Z95811 Presence of heart assist device: Secondary | ICD-10-CM

## 2023-10-01 LAB — POCT INR: INR: 1.9 — AB (ref 2.0–3.0)

## 2023-10-01 MED ORDER — EMPAGLIFLOZIN 10 MG PO TABS: 10.0000 mg | ORAL_TABLET | Freq: Every day | ORAL | 6 refills | Status: DC

## 2023-10-02 ENCOUNTER — Other Ambulatory Visit: Payer: Self-pay

## 2023-10-02 MED ORDER — INSULIN LISPRO (1 UNIT DIAL) 100 UNIT/ML (KWIKPEN): 5.0000 [IU] | PEN_INJECTOR | Freq: Three times a day (TID) | SUBCUTANEOUS | 0 refills | Status: DC

## 2023-10-07 ENCOUNTER — Other Ambulatory Visit (HOSPITAL_COMMUNITY): Payer: Self-pay | Admitting: Cardiology

## 2023-10-07 DIAGNOSIS — Z95811 Presence of heart assist device: Secondary | ICD-10-CM

## 2023-10-07 DIAGNOSIS — I509 Heart failure, unspecified: Secondary | ICD-10-CM

## 2023-10-08 ENCOUNTER — Ambulatory Visit (HOSPITAL_COMMUNITY): Payer: Self-pay | Admitting: Pharmacist

## 2023-10-08 LAB — POCT INR: INR: 2.9 (ref 2.0–3.0)

## 2023-10-15 ENCOUNTER — Encounter (HOSPITAL_COMMUNITY): Payer: 59 | Admitting: Cardiology

## 2023-10-15 ENCOUNTER — Other Ambulatory Visit (HOSPITAL_COMMUNITY): Payer: Self-pay | Admitting: *Deleted

## 2023-10-15 ENCOUNTER — Ambulatory Visit (HOSPITAL_COMMUNITY): Payer: Self-pay | Admitting: Pharmacist

## 2023-10-15 DIAGNOSIS — Z95811 Presence of heart assist device: Secondary | ICD-10-CM

## 2023-10-15 DIAGNOSIS — I509 Heart failure, unspecified: Secondary | ICD-10-CM

## 2023-10-15 DIAGNOSIS — Z7901 Long term (current) use of anticoagulants: Secondary | ICD-10-CM

## 2023-10-15 DIAGNOSIS — Z5181 Encounter for therapeutic drug level monitoring: Secondary | ICD-10-CM

## 2023-10-15 LAB — POCT INR: INR: 2.4 (ref 2.0–3.0)

## 2023-10-16 ENCOUNTER — Encounter (HOSPITAL_COMMUNITY): Payer: Self-pay | Admitting: Unknown Physician Specialty

## 2023-10-16 ENCOUNTER — Ambulatory Visit (HOSPITAL_BASED_OUTPATIENT_CLINIC_OR_DEPARTMENT_OTHER)
Admission: RE | Admit: 2023-10-16 | Discharge: 2023-10-16 | Disposition: A | Discharge status: Home / Self Care | Payer: 59 | Source: Ambulatory Visit | Attending: Cardiology | Admitting: Cardiology

## 2023-10-16 ENCOUNTER — Ambulatory Visit (HOSPITAL_COMMUNITY)
Admission: RE | Admit: 2023-10-16 | Discharge: 2023-10-16 | Disposition: A | Discharge status: Home / Self Care | Payer: 59 | Source: Ambulatory Visit | Attending: Cardiology | Admitting: Cardiology

## 2023-10-16 DIAGNOSIS — I5022 Chronic systolic (congestive) heart failure: Secondary | ICD-10-CM | POA: Diagnosis not present

## 2023-10-16 DIAGNOSIS — E039 Hypothyroidism, unspecified: Secondary | ICD-10-CM | POA: Insufficient documentation

## 2023-10-16 DIAGNOSIS — Z95811 Presence of heart assist device: Secondary | ICD-10-CM

## 2023-10-16 DIAGNOSIS — I428 Other cardiomyopathies: Secondary | ICD-10-CM | POA: Insufficient documentation

## 2023-10-16 DIAGNOSIS — I4891 Unspecified atrial fibrillation: Secondary | ICD-10-CM | POA: Diagnosis not present

## 2023-10-16 DIAGNOSIS — N183 Chronic kidney disease, stage 3 unspecified: Secondary | ICD-10-CM | POA: Insufficient documentation

## 2023-10-16 DIAGNOSIS — Z7984 Long term (current) use of oral hypoglycemic drugs: Secondary | ICD-10-CM | POA: Insufficient documentation

## 2023-10-16 DIAGNOSIS — I4892 Unspecified atrial flutter: Secondary | ICD-10-CM | POA: Diagnosis not present

## 2023-10-16 DIAGNOSIS — I509 Heart failure, unspecified: Secondary | ICD-10-CM

## 2023-10-16 DIAGNOSIS — Z7682 Awaiting organ transplant status: Secondary | ICD-10-CM | POA: Insufficient documentation

## 2023-10-16 DIAGNOSIS — Z4509 Encounter for adjustment and management of other cardiac device: Secondary | ICD-10-CM | POA: Insufficient documentation

## 2023-10-16 DIAGNOSIS — Z7901 Long term (current) use of anticoagulants: Secondary | ICD-10-CM | POA: Insufficient documentation

## 2023-10-16 DIAGNOSIS — D631 Anemia in chronic kidney disease: Secondary | ICD-10-CM | POA: Diagnosis not present

## 2023-10-16 DIAGNOSIS — I5023 Acute on chronic systolic (congestive) heart failure: Secondary | ICD-10-CM | POA: Diagnosis not present

## 2023-10-16 DIAGNOSIS — Z79890 Hormone replacement therapy (postmenopausal): Secondary | ICD-10-CM | POA: Insufficient documentation

## 2023-10-16 DIAGNOSIS — Z5181 Encounter for therapeutic drug level monitoring: Secondary | ICD-10-CM

## 2023-10-16 DIAGNOSIS — E1122 Type 2 diabetes mellitus with diabetic chronic kidney disease: Secondary | ICD-10-CM | POA: Insufficient documentation

## 2023-10-16 DIAGNOSIS — I130 Hypertensive heart and chronic kidney disease with heart failure and stage 1 through stage 4 chronic kidney disease, or unspecified chronic kidney disease: Secondary | ICD-10-CM | POA: Insufficient documentation

## 2023-10-16 DIAGNOSIS — Z79899 Other long term (current) drug therapy: Secondary | ICD-10-CM | POA: Insufficient documentation

## 2023-10-16 DIAGNOSIS — Z87891 Personal history of nicotine dependence: Secondary | ICD-10-CM | POA: Insufficient documentation

## 2023-10-16 DIAGNOSIS — Z794 Long term (current) use of insulin: Secondary | ICD-10-CM | POA: Diagnosis not present

## 2023-10-16 DIAGNOSIS — J449 Chronic obstructive pulmonary disease, unspecified: Secondary | ICD-10-CM | POA: Diagnosis not present

## 2023-10-16 DIAGNOSIS — Z86718 Personal history of other venous thrombosis and embolism: Secondary | ICD-10-CM | POA: Diagnosis not present

## 2023-10-16 LAB — LACTATE DEHYDROGENASE: LDH: 209 U/L — ABNORMAL HIGH (ref 98–192)

## 2023-10-16 LAB — CBC
HCT: 46.2 % (ref 39.0–52.0)
Hemoglobin: 14.2 g/dL (ref 13.0–17.0)
MCH: 21.7 pg — ABNORMAL LOW (ref 26.0–34.0)
MCHC: 30.7 g/dL (ref 30.0–36.0)
MCV: 70.8 fL — ABNORMAL LOW (ref 80.0–100.0)
Platelets: 364 10*3/uL (ref 150–400)
RBC: 6.53 MIL/uL — ABNORMAL HIGH (ref 4.22–5.81)
RDW: 21.9 % — ABNORMAL HIGH (ref 11.5–15.5)
WBC: 7.9 10*3/uL (ref 4.0–10.5)
nRBC: 0 % (ref 0.0–0.2)

## 2023-10-16 LAB — BASIC METABOLIC PANEL
Anion gap: 9 (ref 5–15)
BUN: 26 mg/dL — ABNORMAL HIGH (ref 6–20)
CO2: 29 mmol/L (ref 22–32)
Calcium: 8.9 mg/dL (ref 8.9–10.3)
Chloride: 94 mmol/L — ABNORMAL LOW (ref 98–111)
Creatinine, Ser: 1.3 mg/dL — ABNORMAL HIGH (ref 0.61–1.24)
GFR, Estimated: >60 mL/min (ref >60)
Glucose, Bld: 226 mg/dL — ABNORMAL HIGH (ref 70–99)
Potassium: 3.7 mmol/L (ref 3.5–5.1)
Sodium: 132 mmol/L — ABNORMAL LOW (ref 135–145)

## 2023-10-16 LAB — DIGOXIN LEVEL: Digoxin Level: 0.3 ng/mL — ABNORMAL LOW (ref 0.8–2.0)

## 2023-10-16 LAB — PROTIME-INR
INR: 2 — ABNORMAL HIGH (ref 0.8–1.2)
Prothrombin Time: 22.5 s — ABNORMAL HIGH (ref 11.4–15.2)

## 2023-10-16 LAB — MAGNESIUM: Magnesium: 2.4 mg/dL (ref 1.7–2.4)

## 2023-10-16 LAB — ECHOCARDIOGRAM LIMITED: Est EF: <20

## 2023-10-16 MED ORDER — ATORVASTATIN CALCIUM 20 MG PO TABS: 40.0000 mg | ORAL_TABLET | Freq: Every day | ORAL | Status: DC

## 2023-10-16 NOTE — Progress Notes (Signed)
 Speed  Flow  PI  Power  LVIDD  AI  Aortic opening MR  TR  Septum  RV  VTI (>18cm) TAPSE  5400 4.9 3.3 4.1 5.8 none 5/5 none  24 Slight pull to right  mod    5500  5.1 3.2 4.3 5.7 none 5/5 none  midline  12.1 1.4  5600  5.2 3.3 4.4 5.8  5/5 none                                                     Doppler MAP: 100 Auto cuff BP: 96/83 (89)   Ramp ECHO performed at bedside per   At completion of ramp study, patients primary controller programmed:  Fixed speed: 5600 Low speed limit: 5300   Carlton Adam RN, VAD Coordinator 24/7 pager [phone]

## 2023-10-16 NOTE — Patient Instructions (Addendum)
 No change in medications We have increased your speed to 5600 today Lauren will call you with Coumadin instructions Return to clinic in 2 months

## 2023-10-16 NOTE — Progress Notes (Addendum)
 Pt presents for 1 month f/u  in VAD clinic w/RAMP echo today alone. Denies issues with VAD equipment or drive line.   Pt walked into clinic independently. States he is overall feeling well. Denies lightheadedness, falls, and signs of bleeding.  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  Pt reports doing well and has not had anymore issues with dizziness or double vision.  Pt endorses compliance in checking his blood sugars- currently wearing Freestyle Libre. He is following up with endocrinology regularly and is working with registered dietitian to help blood sugar control.   Listed for transplant Status 4 at [facility]. He has follow up with them 11/15/23.   [facility] requesting for pt to have RHC done locally. Scheduled for 10/29/23.  Pt had RAMP echo today in clinic speed was increased to 5600 from 5400. See separate note for details.   Vital Signs:  Doppler: 100     Automatic BP: 96/83 (89)   HR: 85 NSR     SpO2: 97% on RA  Weight: 200.2 lb w/ equip Last weight: 195.2lb w/ equip  Destination Therapy due to uncontrolled diabetes   LVAD assessment: HM III:  VAD Speed: 5600 rpms Flow: 5.2     Power: 4.4 w       PI: 3.3     Hct: 30   Alarms: none Events: 0-5 PI events daily  Fixed speed: 5400 Low speed limit: 5100  Primary controller: back up battery due for replacement in 13 months Secondary controller: back up battery due for replacement in 12 months  I reviewed the LVAD parameters from today and compared the results to the patient's prior recorded data. LVAD interrogation was NEGATIVE for significant power changes, NEGATIVE for clinical alarms and STABLE for PI events/speed drops. No programming changes were made and pump is functioning within specified parameters. Pt is performing daily controller and system monitor self tests along with completing weekly and monthly maintenance for LVAD equipment.   LVAD equipment check completed and is in good working order. Back-up  equipment not present today.   Annual Equipment Maintenance on UBC/PM was performed on 06/2023.   Exit Site Care: CDI. Drive line anchor secure. Pt denies fever or chills. Pt given 8 weekly kits for home use.   BP & Labs:  Doppler 100 - Doppler is reflecting modified systolic   Hgb 14.2 - No S/S of bleeding. Specifically denies melena/BRBPR or nosebleeds.   LDH stable at 209 - Denies tea-colored urine. No power elevations noted on interrogation.   Patient Instructions: No change in medications We have increased your speed to 5600 today Lauren will call you with Coumadin instructions RHC 10/29/23 for [facility] team Return to clinic in 2 months  Carlton Adam BSN, RN VAD Coordinator  Office: [phone]  24/7 Pager: [phone]      ADVANCED HF/VAD CLINIC NOTE   Mr Degnan is a 63 y.o. with history of atrial fibrillation/flutter and nonischemic cardiomyopathy who was initially referred by Dr. Wyline Mood for evaluation of CHF.  Patient was diagnosed with CHF in 2020 at [facility] in [place].  At the time, he drank heavily and used cocaine.  He stopped drinking, smoking, and cocaine in 2020 and has not used any of these substances since then. Echo in 11/20 showed EF < 10%, cath in 12/20 showed normal coronaries.  Echo has shown persistently low EF over time, echo in 2/23 showed EF 20-25%.  He has a Medtronic ICD.      Patient was admitted  in 2/23 with atrial flutter/RVR. He was started on amiodarone and converted to NSR.  He had atrial flutter ablation in 5/23 and amiodarone was stopped  Echo was done in 11/23 showing elevated filling pressure and markedly low cardiac output.  Patient was admitted, started on milrinone, and underwent HM3 LVAD placement with LAA clipping later in 11/23.  Post-op course complicated by acute hypoxemic respiratory failure post-op and PNA.  Patient went to CIR after acute hospital course.   Ramp echo was done in 4/24, at 5500 bpm, the IV septum was  shifted mildly to the left with moderate RV dilation and dysfunction, AoV opened rarely.  Speed decreased to 5400 bpm, slight opening AoV every beat, trivial AI, midline IV septum.  Speed left at 5400 bpm.   Ramp echo was done today.  Speed started at 5400 rpm and was increased to 5600 rpm.  Flow increased from 4.9 L/min to 5.2 L/min.  The aortic valve opened every beat still at 5600 rpm but the septum started out rightward and ended up midline at 5600 rpm.  Speed was left at 5600 rpm. There was moderate RV dysfunction, IVC normal.   Patient returns for LVAD followup.  Weight is up 5 lbs.  He is doing custodial work part-time.  He is doing well symptomatically.  No dyspnea with usual activities.  No lightheadedness.  No orthopnea/PND.  Device interrogation showed rare PI events.   Labs (12/23): K 4.3, creatinine 1.25 => 1.45, Na 128, hgb 8.8 => 9.6, LDH 211, LFTs normal, TSH normal, digoxin 0.8 Labs (1/24): K 3.9, creatinine 1.32, LDH 224, hgb 10.4 Labs (2/24): K 4.5, creatinine 1.33 Labs (4/24): creatinine 1.16, hgb 10.9 => 11 Labs (6/24): K 3.8, creatinine 1.32 Labs (7/24): K 4, creatinine 1.39, hgb 12.3, LDH 219 Labs (10/24): K 4.6, creatinine 1.25, hgb 12.1, LDH 183 Labs (12/24): K 4.3, creatinine 1.2, hgb 13.2 Labs (2/25): K 3.8, creatinine 1.18, hgb 14.2  PMH: 1. Atrial fibrillation/flutter: Mainly flutter.  - 5/23 Atrial flutter ablation.  2. H/o DVT 3. Chronic systolic CHF: Nonischemic cardiomyopathy.  Medtronic ICD.  - Echo (11/20): EF < 10% - Cath (12/20): Normal coronaries.  - Echo (8/22): EF 30%, mildly decreased RV systolic function.  - Echo (2/23): EF 20-25%, normal RV.  - RHC (11/23): mean RA 14, PA 66/53 mean 54, mean PCWP 43, CI 1.62 F/1.31 T - HM3 LVAD with LAA clipping on 07/04/22.  - Ramp echo (2/24): Speed increased to 5500 rpm.  At this speed, IV septum was midline.  Aortic valve opened 1/3 beats.  Mild RV enlargement with moderate RV dysfunction.  - Ramp echo  (3/24): Speed decreased back to 5400 rpm as the IV septum was shifted left.  Midline septum at 5400 rpm.  - Ramp echo (2/25): Speed increased to 5600 rpm with IV septum midline at 5600.  Moderate RV dysfunction.  4. Prior cocaine abuse.  5. Prior heavy ETOH 6. COPD: Moderate obstruction on 11/23 PFTs. No longer smokes.  7. Type 2 diabetes   SH: Patient was a heavy drinker, abused cocaine, and smoked in the past but quit in 2020.  Lives in [place]. He does part-time custodial work.    FH: Mother with PPM, aunt with CHF.    VAD Indication: Destination Therapy due to uncontrolled diabetes   LVAD assessment: HM III: See LVAD nurse's note above. I reviewed the parameters personally.     ROS: All systems negative except as listed in HPI, PMH and Problem List.  Current Outpatient Medications  Medication Sig Dispense Refill   acetaminophen (TYLENOL) 325 MG tablet Take 1-2 tablets (325-650 mg total) by mouth every 4 (four) hours as needed for mild pain.     Calcium Carb-Cholecalciferol 600-10 MG-MCG TABS Take 1 tablet by mouth daily with breakfast. 90 tablet 3   Continuous Glucose Receiver (FREESTYLE LIBRE 3 READER) DEVI 1 each by Does not apply route daily. 2 each 3   Continuous Glucose Sensor (FREESTYLE LIBRE 3 SENSOR) MISC PLACE 1 SENSOR ON THE SKIN EVERY 14 DAYS 2 each 3   digoxin (LANOXIN) 0.125 MG tablet Take 0.5 tablets (0.0625 mg total) by mouth daily. 30 tablet 5   empagliflozin (JARDIANCE) 10 MG TABS tablet Take 1 tablet (10 mg total) by mouth daily. 30 tablet 6   Fe Fum-Vit C-Vit B12-FA (TRIGELS-F FORTE) CAPS capsule Take 1 capsule by mouth daily after breakfast. 30 capsule 6   insulin glargine (LANTUS SOLOSTAR) 100 UNIT/ML Solostar Pen Inject 35 Units into the skin at bedtime. 15 mL 2   insulin lispro (HUMALOG KWIKPEN) 100 UNIT/ML KwikPen Inject 5-8 Units into the skin 3 (three) times daily with meals. 15 mL 0   Insulin Pen Needle (PEN NEEDLES) 32G X 4 MM MISC Use with insulin pen  with breakfast, with lunch, and with evening meal. 100 each 0   levothyroxine (SYNTHROID) 25 MCG tablet Take 1 tablet (25 mcg total) by mouth daily before breakfast. 90 tablet 3   magnesium oxide (MAG-OX) 400 (240 Mg) MG tablet TAKE 1 TABLET(400 MG) BY MOUTH TWICE DAILY 180 tablet 3   Multiple Vitamin (MULTIVITAMIN WITH MINERALS) TABS tablet Take 1 tablet by mouth daily.     pantoprazole (PROTONIX) 40 MG tablet Take 1 tablet (40 mg total) by mouth daily. 90 tablet 3   potassium chloride SA (KLOR-CON M) 20 MEQ tablet Take 2 tablets (40 mEq total) by mouth daily. 180 tablet 3   sertraline (ZOLOFT) 50 MG tablet Take 1 tablet (50 mg total) by mouth daily. 90 tablet 3   sildenafil (REVATIO) 20 MG tablet TAKE 1 TABLET(20 MG) BY MOUTH THREE TIMES DAILY 90 tablet 6   spironolactone (ALDACTONE) 25 MG tablet Take 1 tablet (25 mg total) by mouth at bedtime. 90 tablet 3   tiotropium (SPIRIVA HANDIHALER) 18 MCG inhalation capsule Place 1 capsule (18 mcg total) into inhaler and inhale daily. 30 capsule 12   torsemide (DEMADEX) 20 MG tablet Take 3 tablets (60 mg total) by mouth 2 (two) times daily. 180 tablet 10   warfarin (COUMADIN) 3 MG tablet TAKE 2 TABLETS BY MOUTH EVERY TUESDAY, THURSDAYS AND SATURDAY AND 1 TABLET ALL OTHER DAYS 120 tablet 6   atorvastatin (LIPITOR) 20 MG tablet Take 2 tablets (40 mg total) by mouth daily.     No current facility-administered medications for this encounter.    Patient has no known allergies.  REVIEW OF SYSTEMS: All systems negative except as listed in HPI, PMH and Problem list.  Vital Signs:  MAP 86 Physical Exam: General: Well appearing this am. NAD.  HEENT: Normal. Neck: Supple, JVP 7-8 cm. Carotids OK.  Cardiac:  Mechanical heart sounds with LVAD hum present.  Lungs:  CTAB, normal effort.  Abdomen:  NT, ND, no HSM. No bruits or masses. +BS  LVAD exit site: Well-healed and incorporated. Dressing dry and intact. No erythema or drainage. Stabilization device  present and accurately applied. Driveline dressing changed daily per sterile technique. Extremities:  Warm and dry. No cyanosis, clubbing, rash, or edema.  Neuro:  Alert & oriented x 3. Cranial nerves grossly intact. Moves all 4 extremities w/o difficulty. Affect pleasant    ASSESSMENT AND PLAN:   1. Chronic systolic CHF: Nonischemic cardiomyopathy, diagnosed 2020.  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  Cath in 12/20 with no significant coronary disease.  Medtronic ICD. Echo in 2/23 showed EF 20-25% with normal RV.  RHC in 11/13 showed markedly elevated filling pressures, primarily pulmonary venous hypertension, low cardiac output, and low PAPI. Echo in 11/23 with EF <20%, mod RV dysfunction.  He was admitted and started on milrinone with improvement in hemodynamics.  We proceeded with HM3 LVAD with LAA clip on 07/04/22.  Ramp echo was done today, speed increased to 5600 rpm.  LVAD parameters stable.  He is not volume overloaded on exam, NYHA class I-II.   - Continue torsemide 60 mg bid.  BMET today.  - Continue Jardiance 10 mg daily.  - Continue sildenafil 20 tid for RV.  - Continue spironolactone 25 mg daily.   - Continue digoxin 0.0625 daily, check level today.  - He is now listed for transplant at [facility].  2. Atrial fibrillation/atrial flutter: H/o flutter ablation.  Had AF/RVR post-LVAD, went back into NSR.  He is now off amiodarone.  - Continue warfarin.  3. VAD management: s/p HM-3 VAD on 07/04/22. Ramp echo in 4/24, speed decreased to 5400 rpm.  Stable LVAD parameters.  Ramp echo today with speed increased to 5600 rpm and increase in flow 4.9 => 5.2 L/min.  - Now off ASA.  - Continue warfarin, INR goal 2-2.5. 4. H/o DVT: On anticoagulation.  5. CKD stage 3: On Jardiance.  - BMET today.  6. Hypothyroidism -  On levothyroxine.  7. Anemia:   - CBC today.  8. COPD: Moderate on 11/23 PFTs.  Suspect this plays a role in his dyspnea.  - Continue Spiriva.     9. Diabetes: Endocrinology followup.                                                                                                                                                                                                             Followup in 2 months.   I spent 41 minutes reviewing data and LVAD parameters, interviewing patient, and organizing the orders/followup.   Marca Ancona 10/16/2023

## 2023-10-22 ENCOUNTER — Ambulatory Visit (HOSPITAL_COMMUNITY): Payer: Self-pay | Admitting: Pharmacist

## 2023-10-22 LAB — POCT INR: INR: 2.9 (ref 2.0–3.0)

## 2023-10-23 ENCOUNTER — Other Ambulatory Visit (HOSPITAL_COMMUNITY): Payer: Self-pay | Admitting: Cardiology

## 2023-10-29 ENCOUNTER — Other Ambulatory Visit (HOSPITAL_COMMUNITY): Payer: Self-pay

## 2023-10-29 ENCOUNTER — Ambulatory Visit (HOSPITAL_COMMUNITY): Payer: Self-pay | Admitting: Pharmacist

## 2023-10-29 ENCOUNTER — Ambulatory Visit (HOSPITAL_COMMUNITY)
Admission: RE | Admit: 2023-10-29 | Discharge: 2023-10-29 | Disposition: A | Discharge status: Home / Self Care | Payer: 59 | Attending: Cardiology | Admitting: Cardiology

## 2023-10-29 ENCOUNTER — Encounter (HOSPITAL_COMMUNITY)
Admission: RE | Disposition: A | Discharge status: Home / Self Care | Payer: Self-pay | Source: Home / Self Care | Attending: Cardiology

## 2023-10-29 ENCOUNTER — Other Ambulatory Visit: Payer: Self-pay

## 2023-10-29 DIAGNOSIS — J449 Chronic obstructive pulmonary disease, unspecified: Secondary | ICD-10-CM | POA: Insufficient documentation

## 2023-10-29 DIAGNOSIS — I4891 Unspecified atrial fibrillation: Secondary | ICD-10-CM | POA: Insufficient documentation

## 2023-10-29 DIAGNOSIS — Z79899 Other long term (current) drug therapy: Secondary | ICD-10-CM | POA: Diagnosis not present

## 2023-10-29 DIAGNOSIS — I509 Heart failure, unspecified: Secondary | ICD-10-CM

## 2023-10-29 DIAGNOSIS — Z95811 Presence of heart assist device: Secondary | ICD-10-CM | POA: Insufficient documentation

## 2023-10-29 DIAGNOSIS — I428 Other cardiomyopathies: Secondary | ICD-10-CM | POA: Insufficient documentation

## 2023-10-29 DIAGNOSIS — N183 Chronic kidney disease, stage 3 unspecified: Secondary | ICD-10-CM | POA: Diagnosis not present

## 2023-10-29 DIAGNOSIS — I4892 Unspecified atrial flutter: Secondary | ICD-10-CM | POA: Insufficient documentation

## 2023-10-29 DIAGNOSIS — I5022 Chronic systolic (congestive) heart failure: Secondary | ICD-10-CM | POA: Insufficient documentation

## 2023-10-29 DIAGNOSIS — Z79890 Hormone replacement therapy (postmenopausal): Secondary | ICD-10-CM | POA: Insufficient documentation

## 2023-10-29 DIAGNOSIS — Z7901 Long term (current) use of anticoagulants: Secondary | ICD-10-CM | POA: Diagnosis not present

## 2023-10-29 DIAGNOSIS — E039 Hypothyroidism, unspecified: Secondary | ICD-10-CM | POA: Insufficient documentation

## 2023-10-29 DIAGNOSIS — Z95810 Presence of automatic (implantable) cardiac defibrillator: Secondary | ICD-10-CM | POA: Diagnosis not present

## 2023-10-29 DIAGNOSIS — E1122 Type 2 diabetes mellitus with diabetic chronic kidney disease: Secondary | ICD-10-CM | POA: Diagnosis not present

## 2023-10-29 DIAGNOSIS — I5023 Acute on chronic systolic (congestive) heart failure: Secondary | ICD-10-CM

## 2023-10-29 HISTORY — PX: RIGHT HEART CATH: CATH118263

## 2023-10-29 LAB — POCT I-STAT EG7
Acid-Base Excess: 4 mmol/L — ABNORMAL HIGH (ref 0.0–2.0)
Acid-Base Excess: 4 mmol/L — ABNORMAL HIGH (ref 0.0–2.0)
Bicarbonate: 29.2 mmol/L — ABNORMAL HIGH (ref 20.0–28.0)
Bicarbonate: 29.5 mmol/L — ABNORMAL HIGH (ref 20.0–28.0)
Calcium, Ion: 1.12 mmol/L — ABNORMAL LOW (ref 1.15–1.40)
Calcium, Ion: 1.16 mmol/L (ref 1.15–1.40)
HCT: 46 % (ref 39.0–52.0)
HCT: 46 % (ref 39.0–52.0)
Hemoglobin: 15.6 g/dL (ref 13.0–17.0)
Hemoglobin: 15.6 g/dL (ref 13.0–17.0)
O2 Saturation: 59 %
O2 Saturation: 65 %
Potassium: 3.7 mmol/L (ref 3.5–5.1)
Potassium: 3.8 mmol/L (ref 3.5–5.1)
Sodium: 140 mmol/L (ref 135–145)
Sodium: 141 mmol/L (ref 135–145)
TCO2: 31 mmol/L (ref 22–32)
TCO2: 31 mmol/L (ref 22–32)
pCO2, Ven: 45.1 mmHg (ref 44–60)
pCO2, Ven: 46.2 mmHg (ref 44–60)
pH, Ven: 7.414 (ref 7.25–7.43)
pH, Ven: 7.42 (ref 7.25–7.43)
pO2, Ven: 31 mmHg — CL (ref 32–45)
pO2, Ven: 33 mmHg (ref 32–45)

## 2023-10-29 LAB — PROTIME-INR
INR: 2 — ABNORMAL HIGH (ref 0.8–1.2)
Prothrombin Time: 22.9 s — ABNORMAL HIGH (ref 11.4–15.2)

## 2023-10-29 LAB — GLUCOSE, CAPILLARY: Glucose-Capillary: 135 mg/dL — ABNORMAL HIGH (ref 70–99)

## 2023-10-29 SURGERY — RIGHT HEART CATH
Anesthesia: LOCAL

## 2023-10-29 MED ORDER — SODIUM CHLORIDE 0.9 % IV SOLN: 250.0000 mL | INTRAVENOUS | Status: DC | PRN

## 2023-10-29 MED ORDER — HYDRALAZINE HCL 20 MG/ML IJ SOLN: 10.0000 mg | INTRAMUSCULAR | Status: DC | PRN

## 2023-10-29 MED ORDER — HEPARIN (PORCINE) IN NACL 1000-0.9 UT/500ML-% IV SOLN
INTRAVENOUS | Status: DC | PRN
  Administered 2023-10-29: 500 mL via INTRAVENOUS

## 2023-10-29 MED ORDER — ACETAMINOPHEN 325 MG PO TABS: 650.0000 mg | ORAL_TABLET | ORAL | Status: DC | PRN

## 2023-10-29 MED ORDER — SODIUM CHLORIDE 0.9% FLUSH: 3.0000 mL | Freq: Two times a day (BID) | INTRAVENOUS | Status: DC

## 2023-10-29 MED ORDER — LABETALOL HCL 5 MG/ML IV SOLN: 10.0000 mg | INTRAVENOUS | Status: DC | PRN

## 2023-10-29 MED ORDER — SODIUM CHLORIDE 0.9% FLUSH: 3.0000 mL | INTRAVENOUS | Status: DC | PRN

## 2023-10-29 MED ORDER — LIDOCAINE HCL (PF) 1 % IJ SOLN
INTRAMUSCULAR | Status: AC
  Filled 2023-10-29: qty 30

## 2023-10-29 MED ORDER — LIDOCAINE HCL (PF) 1 % IJ SOLN
INTRAMUSCULAR | Status: DC | PRN
  Administered 2023-10-29: 2 mL

## 2023-10-29 MED ORDER — SODIUM CHLORIDE 0.9 % IV SOLN: INTRAVENOUS | Status: DC

## 2023-10-29 SURGICAL SUPPLY — 5 items
CATH SWAN GANZ 7F STRAIGHT (CATHETERS) IMPLANT
GLIDESHEATH SLENDER 7FR .021G (SHEATH) IMPLANT
PACK CARDIAC CATHETERIZATION (CUSTOM PROCEDURE TRAY) ×1 IMPLANT
TRANSDUCER W/STOPCOCK (MISCELLANEOUS) IMPLANT
TUBING ART PRESS 72 MALE/FEM (TUBING) IMPLANT

## 2023-10-29 NOTE — Discharge Instructions (Signed)

## 2023-10-29 NOTE — Progress Notes (Signed)
 VAD Coordinator Procedure Note:   VAD Coordinator met patient in cath lab. Pt undergoing outpatient RHC per Dr. Shirlee Latch. Hemodynamics and VAD parameters monitored by myself and cath lab RN throughout the procedure. Blood pressures were obtained with automatic cuff on left arm.   Time: Auto  BP Flow PI Power Speed  Pre-procedure:  0900 100/85(92) 5.3 3.0 4.3 5600          Intra-Procedure 0920 94/74(84) 5.1 3.0 4.3 5600   0925 100/69(79) 5.3 2.6 4.4 5700   Speed increased to 5700 per Dr. Shirlee Latch. Will perform ramp echo at next appointment.  Patient tolerated the procedure well. No sedation received in procedure. Pt able to manage VAD independently at this time.   Simmie Davies RN, BSN VAD Coordinator 24/7 Pager [phone]

## 2023-10-30 ENCOUNTER — Encounter (HOSPITAL_COMMUNITY): Payer: Self-pay | Admitting: Cardiology

## 2023-11-05 ENCOUNTER — Ambulatory Visit (INDEPENDENT_AMBULATORY_CARE_PROVIDER_SITE_OTHER): Payer: Medicare Other

## 2023-11-05 ENCOUNTER — Ambulatory Visit (HOSPITAL_COMMUNITY): Payer: Self-pay | Admitting: Pharmacist

## 2023-11-05 DIAGNOSIS — I509 Heart failure, unspecified: Secondary | ICD-10-CM

## 2023-11-05 LAB — CUP PACEART REMOTE DEVICE CHECK
Battery Remaining Longevity: 83 mo
Battery Voltage: 2.99 V
Brady Statistic AP VP Percent: 0 %
Brady Statistic AP VS Percent: 3.03 %
Brady Statistic AS VP Percent: 0.01 %
Brady Statistic AS VS Percent: 96.96 %
Brady Statistic RA Percent Paced: 3.02 %
Brady Statistic RV Percent Paced: 0.01 %
Date Time Interrogation Session: 20250317052824
HighPow Impedance: 73 Ohm
Lead Channel Impedance Value: 342 Ohm
Lead Channel Impedance Value: 456 Ohm
Lead Channel Impedance Value: 456 Ohm
Lead Channel Pacing Threshold Amplitude: 0.625 V
Lead Channel Pacing Threshold Amplitude: 2.5 V
Lead Channel Pacing Threshold Pulse Width: 0.4 ms
Lead Channel Pacing Threshold Pulse Width: 0.4 ms
Lead Channel Sensing Intrinsic Amplitude: 4.5 mV
Lead Channel Sensing Intrinsic Amplitude: 4.5 mV
Lead Channel Sensing Intrinsic Amplitude: 5.25 mV
Lead Channel Sensing Intrinsic Amplitude: 5.25 mV
Lead Channel Setting Pacing Amplitude: 1.5 V
Lead Channel Setting Pacing Amplitude: 3 V
Lead Channel Setting Pacing Pulse Width: 1 ms
Lead Channel Setting Sensing Sensitivity: 0.3 mV
Zone Setting Status: 755011
Zone Setting Status: 755011

## 2023-11-05 LAB — POCT INR: INR: 2.1 (ref 2.0–3.0)

## 2023-11-06 ENCOUNTER — Encounter: Payer: Self-pay | Admitting: Internal Medicine

## 2023-11-06 ENCOUNTER — Telehealth (HOSPITAL_COMMUNITY): Payer: Self-pay | Admitting: Licensed Clinical Social Worker

## 2023-11-06 NOTE — Telephone Encounter (Signed)
 CSW contacted patient to share about the upcoming LVAD Men's Group on Thursday March 27 at 1:30 pm in the Heart and Vascular Conference Room. Patient expressed interest. Lasandra Beech, LCSW, CCSW-MCS [phone]

## 2023-11-12 ENCOUNTER — Ambulatory Visit (HOSPITAL_COMMUNITY): Payer: Self-pay | Admitting: Pharmacist

## 2023-11-12 LAB — POCT INR: INR: 2 (ref 2.0–3.0)

## 2023-11-15 ENCOUNTER — Ambulatory Visit (INDEPENDENT_AMBULATORY_CARE_PROVIDER_SITE_OTHER): Payer: 59 | Admitting: "Endocrinology

## 2023-11-15 ENCOUNTER — Encounter: Payer: Self-pay | Admitting: "Endocrinology

## 2023-11-15 VITALS — Ht 71.0 in | Wt 202.4 lb

## 2023-11-15 DIAGNOSIS — I10 Essential (primary) hypertension: Secondary | ICD-10-CM | POA: Diagnosis not present

## 2023-11-15 DIAGNOSIS — Z794 Long term (current) use of insulin: Secondary | ICD-10-CM | POA: Diagnosis not present

## 2023-11-15 DIAGNOSIS — E1169 Type 2 diabetes mellitus with other specified complication: Secondary | ICD-10-CM | POA: Diagnosis not present

## 2023-11-15 DIAGNOSIS — E782 Mixed hyperlipidemia: Secondary | ICD-10-CM

## 2023-11-15 MED ORDER — PEN NEEDLES 32G X 4 MM MISC: 3 refills | Status: DC

## 2023-11-15 MED ORDER — LANTUS SOLOSTAR 100 UNIT/ML ~~LOC~~ SOPN: 40.0000 [IU] | PEN_INJECTOR | Freq: Every day | SUBCUTANEOUS | 2 refills | Status: DC

## 2023-11-15 NOTE — Progress Notes (Signed)
 11/15/2023, 12:28 PM  Endocrinology follow-up note   Subjective:    Patient ID: Darryl Frye, male    DOB: [birth year].  Darryl Frye is being seen in follow up after he was seen in  consultation for management of currently uncontrolled symptomatic diabetes requested by  Marylynn Pearson, FNP.   Past Medical History:  Diagnosis Date   Acute kidney injury (HCC)    Acute respiratory failure (HCC)    Atrial flutter (HCC)    on Eliquis   CHF (congestive heart failure) (HCC)    EF 20-25% 10/15/21, AICD in place.   Coronary artery disease    Diabetes mellitus without complication (HCC)    DVT (deep venous thrombosis) (HCC) 06/2019   Heart murmur    Hypertension    Presence of permanent cardiac pacemaker    Renal disorder     Past Surgical History:  Procedure Laterality Date   A-FLUTTER ABLATION N/A 01/12/2022   Procedure: A-FLUTTER ABLATION;  Surgeon: Marinus Maw, MD;  Location: [facility] INVASIVE CV LAB;  Service: Cardiovascular;  Laterality: N/A;   COLONOSCOPY     Dr. Allena Katz; One 4-6 mm hyperplastic rectal polyp, mid left-sided diverticulosis.   CYST REMOVAL NECK     HERNIA REPAIR     INSERTION OF IMPLANTABLE LEFT VENTRICULAR ASSIST DEVICE N/A 07/04/2022   Procedure: INSERTION OF IMPLANTABLE LEFT VENTRICULAR ASSIST DEVICE; ATRICLIP 45;  Surgeon: Lyn Hollingshead, MD;  Location: [facility] OR;  Service: Open Heart Surgery;  Laterality: N/A;   RIGHT HEART CATH N/A 06/27/2022   Procedure: RIGHT HEART CATH;  Surgeon: Laurey Morale, MD;  Location: [facility] INVASIVE CV LAB;  Service: Cardiovascular;  Laterality: N/A;   RIGHT HEART CATH N/A 06/29/2022   Procedure: RIGHT HEART CATH;  Surgeon: Laurey Morale, MD;  Location: [facility] INVASIVE CV LAB;  Service: Cardiovascular;  Laterality: N/A;   RIGHT HEART CATH N/A 10/29/2023   Procedure: RIGHT HEART CATH;  Surgeon: Laurey Morale, MD;  Location: [facility] INVASIVE CV LAB;   Service: Cardiovascular;  Laterality: N/A;   TEE WITHOUT CARDIOVERSION N/A 07/04/2022   Procedure: TRANSESOPHAGEAL ECHOCARDIOGRAM (TEE);  Surgeon: Lyn Hollingshead, MD;  Location: [facility] OR;  Service: Open Heart Surgery;  Laterality: N/A;   TONSILLECTOMY      Social History   Socioeconomic History   Marital status: Divorced    Spouse name: Not on file   Number of children: Not on file   Years of education: Not on file   Highest education level: Not on file  Occupational History   Not on file  Tobacco Use   Smoking status: Former    Current packs/day: 0.00    Average packs/day: 1 pack/day for 20.0 years (20.0 ttl pk-yrs)    Types: Cigarettes    Start date: 07/07/1999    Quit date: 07/07/2019    Years since quitting: 4.3   Smokeless tobacco: Never  Vaping Use   Vaping status: Never Used  Substance and Sexual Activity   Alcohol use: Not Currently   Drug use: Not Currently    Types: "Crack" cocaine    Comment: last used  over 2 years ago (10/24/21).   Sexual activity: Not on file  Other Topics Concern   Not on file  Social History Narrative   Not on file   Social Drivers of Health   Financial Resource Strain: Low Risk  (06/29/2022)   Overall Financial Resource Strain (CARDIA)    Difficulty of Paying Living Expenses: Not hard at all  Food Insecurity: No Food Insecurity (06/29/2022)   Hunger Vital Sign    Worried About Running Out of Food in the Last Year: Never true    Ran Out of Food in the Last Year: Never true  Transportation Needs: No Transportation Needs (06/29/2022)   PRAPARE - Administrator, Civil Service (Medical): No    Lack of Transportation (Non-Medical): No  Physical Activity: Not on file  Stress: Not on file  Social Connections: Not on file    Family History  Problem Relation Age of Onset   Hyperlipidemia Mother    Heart disease Mother        has PPM   Diabetes Mother    Hypertension Mother    Heart disease Maternal Grandmother    Prostate  cancer Maternal Grandfather    Colon cancer Neg Hx    Colon polyps Neg Hx     Outpatient Encounter Medications as of 11/15/2023  Medication Sig   acetaminophen (TYLENOL) 325 MG tablet Take 1-2 tablets (325-650 mg total) by mouth every 4 (four) hours as needed for mild pain.   atorvastatin (LIPITOR) 40 MG tablet Take 40 mg by mouth daily.   Calcium Carb-Cholecalciferol 600-10 MG-MCG TABS Take 1 tablet by mouth daily with breakfast.   Continuous Glucose Receiver (FREESTYLE LIBRE 3 READER) DEVI 1 each by Does not apply route daily.   Continuous Glucose Sensor (FREESTYLE LIBRE 3 SENSOR) MISC PLACE 1 SENSOR ON THE SKIN EVERY 14 DAYS   digoxin (LANOXIN) 0.125 MG tablet Take 0.5 tablets (0.0625 mg total) by mouth daily.   empagliflozin (JARDIANCE) 10 MG TABS tablet Take 1 tablet (10 mg total) by mouth daily.   Fe Fum-Vit C-Vit B12-FA (TRIGELS-F FORTE) CAPS capsule Take 1 capsule by mouth daily after breakfast.   ferrous sulfate 325 (65 FE) MG tablet Take 325 mg by mouth daily with breakfast.   insulin glargine (LANTUS SOLOSTAR) 100 UNIT/ML Solostar Pen Inject 40 Units into the skin at bedtime.   insulin lispro (HUMALOG KWIKPEN) 100 UNIT/ML KwikPen Inject 5-8 Units into the skin 3 (three) times daily with meals.   Insulin Pen Needle (PEN NEEDLES) 32G X 4 MM MISC Use to inject insulin 4 times daily as ordered.   levothyroxine (SYNTHROID) 25 MCG tablet Take 1 tablet (25 mcg total) by mouth daily before breakfast.   magnesium oxide (MAG-OX) 400 (240 Mg) MG tablet TAKE 1 TABLET(400 MG) BY MOUTH TWICE DAILY   Multiple Vitamin (MULTIVITAMIN WITH MINERALS) TABS tablet Take 1 tablet by mouth daily.   pantoprazole (PROTONIX) 40 MG tablet Take 1 tablet (40 mg total) by mouth daily.   potassium chloride SA (KLOR-CON M) 20 MEQ tablet Take 2 tablets (40 mEq total) by mouth daily.   sertraline (ZOLOFT) 50 MG tablet Take 1 tablet (50 mg total) by mouth daily.   sildenafil (REVATIO) 20 MG tablet TAKE 1 TABLET(20 MG)  BY MOUTH THREE TIMES DAILY   SPIRIVA HANDIHALER 18 MCG inhalation capsule PLACE 1 CAPSULE INTO INHALER AND INHALE EVERY DAY   spironolactone (ALDACTONE) 25 MG tablet Take 1 tablet (25 mg total) by mouth at bedtime.  torsemide (DEMADEX) 20 MG tablet Take 3 tablets (60 mg total) by mouth 2 (two) times daily.   warfarin (COUMADIN) 3 MG tablet TAKE 2 TABLETS BY MOUTH EVERY TUESDAY, THURSDAYS AND SATURDAY AND 1 TABLET ALL OTHER DAYS (Patient taking differently: Take 3-6 mg by mouth See admin instructions. 3 mg Mon and Fri, 6 mg all other days)   [DISCONTINUED] insulin glargine (LANTUS SOLOSTAR) 100 UNIT/ML Solostar Pen Inject 35 Units into the skin at bedtime.   [DISCONTINUED] Insulin Pen Needle (PEN NEEDLES) 32G X 4 MM MISC Use with insulin pen with breakfast, with lunch, and with evening meal.   No facility-administered encounter medications on file as of 11/15/2023.    ALLERGIES: No Known Allergies  VACCINATION STATUS: Immunization History  Administered Date(s) Administered   Influenza,inj,Quad PF,6+ Mos 07/24/2022   Moderna Sars-Covid-2 Vaccination 11/25/2019, 12/23/2019   PNEUMOCOCCAL CONJUGATE-20 07/24/2022    Diabetes He presents for his follow-up diabetic visit. He has type 2 diabetes mellitus. Onset time: He was diagnosed at approximate age of 84. His disease course has been improving. There are no hypoglycemic associated symptoms. Pertinent negatives for hypoglycemia include no confusion, headaches, pallor or seizures. Pertinent negatives for diabetes include no chest pain, no fatigue, no polyphagia and no weakness. There are no hypoglycemic complications. Symptoms are improving. Diabetic complications include heart disease, impotence and nephropathy. (His diabetes is complicated by CHF with ejection fraction of 20-25% status post permanent cardiac pacemaker, coronary artery disease, comorbid hypertension/hyper lipidemia.) Risk factors for coronary artery disease include dyslipidemia,  diabetes mellitus, male sex, hypertension, tobacco exposure and sedentary lifestyle. His weight is fluctuating minimally. He is following a generally unhealthy diet. When asked about meal planning, he reported none. He has not had a previous visit with a dietitian. He never participates in exercise. His home blood glucose trend is decreasing steadily. His breakfast blood glucose range is generally 140-180 mg/dl. His lunch blood glucose range is generally 140-180 mg/dl. His dinner blood glucose range is generally 140-180 mg/dl. His bedtime blood glucose range is generally 140-180 mg/dl. His overall blood glucose range is 140-180 mg/dl. (He has a freestyle libre 3 CGM.  He presents with 63% time range, 32% Level One hyperglycemia, 5% level 2 hyperglycemia.  He has no hypoglycemia.  His previsit labs show A1c of 7.8%, progressively improving from 11.1%.    ) An ACE inhibitor/angiotensin II receptor blocker is not being taken.  Hyperlipidemia The current episode started more than 1 year ago. Exacerbating diseases include chronic renal disease and diabetes. Pertinent negatives include no chest pain, myalgias or shortness of breath. Risk factors for coronary artery disease include diabetes mellitus, dyslipidemia and male sex.  Hypertension This is a chronic problem. The problem is controlled. Pertinent negatives include no chest pain, headaches, neck pain, palpitations or shortness of breath. Risk factors for coronary artery disease include dyslipidemia, diabetes mellitus, male gender, sedentary lifestyle and smoking/tobacco exposure. Past treatments include diuretics. Hypertensive end-organ damage includes kidney disease, CAD/MI and heart failure. Identifiable causes of hypertension include chronic renal disease.     Review of Systems  Constitutional:  Negative for chills, fatigue, fever and unexpected weight change.  HENT:  Negative for dental problem, mouth sores and trouble swallowing.   Eyes:  Negative  for visual disturbance.  Respiratory:  Negative for cough, choking, chest tightness, shortness of breath and wheezing.   Cardiovascular:  Negative for chest pain, palpitations and leg swelling.  Gastrointestinal:  Negative for abdominal distention, abdominal pain, constipation, diarrhea, nausea and vomiting.  Endocrine: Negative for polyphagia.  Genitourinary:  Positive for impotence. Negative for dysuria, flank pain, hematuria and urgency.  Musculoskeletal:  Negative for back pain, gait problem, myalgias and neck pain.  Skin:  Negative for pallor, rash and wound.  Neurological:  Negative for seizures, syncope, weakness, numbness and headaches.  Psychiatric/Behavioral:  Negative for confusion and dysphoric mood.     Objective:       11/15/2023    9:58 AM 10/29/2023    9:51 AM 10/29/2023    9:29 AM  Vitals with BMI  Height 5\' 11"     Weight 202 lbs 6 oz    BMI 28.24    Systolic  102 100  Diastolic  83 69  Pulse   67    Ht 5\' 11"  (1.803 m)   Wt 202 lb 6.4 oz (91.8 kg)   BMI 28.23 kg/m   Wt Readings from Last 3 Encounters:  11/15/23 202 lb 6.4 oz (91.8 kg)  10/29/23 198 lb (89.8 kg)  09/25/23 196 lb 3.4 oz (89 kg)    Patient with CHF with ejection fraction 20 to 25% with permanent pacemaker    CMP ( most recent) CMP     Component Value Date/Time   NA 140 10/29/2023 0823   NA 141 10/29/2023 0823   NA 135 11/23/2021 0814   K 3.8 10/29/2023 0823   K 3.7 10/29/2023 0823   CL 94 (L) 10/16/2023 0854   CO2 29 10/16/2023 0854   GLUCOSE 226 (H) 10/16/2023 0854   BUN 26 (H) 10/16/2023 0854   BUN 20 11/23/2021 0814   CREATININE 1.30 (H) 10/16/2023 0854   CALCIUM 8.9 10/16/2023 0854   PROT 6.8 09/25/2023 1843   ALBUMIN 3.5 09/25/2023 1843   AST 29 09/25/2023 1843   ALT 31 09/25/2023 1843   ALKPHOS 136 (H) 09/25/2023 1843   BILITOT 0.2 09/25/2023 1843   EGFR 58 (L) 11/23/2021 0814   GFRNONAA >60 10/16/2023 0854     Diabetic Labs (most recent): Lab Results  Component  Value Date   HGBA1C 8.0 (A) 07/18/2023   HGBA1C 11.1 (A) 04/05/2023   HGBA1C 9.3 (H) 06/27/2022     Lipid Panel ( most recent) Lipid Panel     Component Value Date/Time   CHOL 212 (H) 06/27/2022 1825   TRIG 134 06/27/2022 1825   HDL 55 06/27/2022 1825   CHOLHDL 3.9 06/27/2022 1825   VLDL 27 06/27/2022 1825   LDLCALC 130 (H) 06/27/2022 1825      Lab Results  Component Value Date   TSH 8.560 (H) 10/10/2022   TSH 4.362 08/17/2022   TSH 2.576 06/27/2022   TSH 2.723 10/15/2021   FREET4 1.28 (H) 08/17/2022   FREET4 1.06 06/27/2022      Assessment & Plan:   1. Type 2 diabetes mellitus with other specified complication, with Jakubowicz-term current use of insulin (HCC) - Omarri E Biela has currently uncontrolled symptomatic type 2 DM since  63 years of age.  He has a freestyle libre 3 CGM.  He presents with 63% time range, 32% Level One hyperglycemia, 5% level 2 hyperglycemia.  He has no hypoglycemia.  His previsit labs show A1c of 7.8%, progressively improving from 11.1%.    Recent labs reviewed. - I had a [place] discussion with him about the possible risk factors and  the pathology behind its diabetes and its complications. -his diabetes is complicated by  by CHF with ejection fraction of 20-25% status post permanent cardiac pacemaker, coronary artery disease,  comorbid hypertension/hyper lipidemia and he remains at exceedingly high risk for more acute and chronic complications which include CAD, CVA, CKD, retinopathy, and neuropathy. These are all discussed in detail with him.  - I discussed all available options of managing his diabetes including de-escalation of medications. I have counseled him on Food as Medicine by adopting a Whole Food , Plant Predominant  ( WFPP) nutrition as recommended by [employer] of Lifestyle Medicine. Patient is encouraged to switch to  unprocessed or minimally processed  complex starch, adequate protein intake (mainly plant source), minimal liquid fat,  plenty of fruits, and vegetables. -  he is advised to stick to a routine mealtimes to eat 3 complete meals a day and snack only when necessary ( to snack only to correct hypoglycemia BG <70 day time or <100 at night).   - he acknowledges that there is a room for improvement in his food and drink choices. - Suggestion is made for him to avoid simple carbohydrates  from his diet including Cakes, Sweet Desserts, Ice Cream, Soda (diet and regular), Sweet Tea, Candies, Chips, Cookies, Store Bought Juices, Alcohol , Artificial Sweeteners,  Coffee Creamer, and "Sugar-free" Products, Lemonade. This will help patient to have more stable blood glucose profile and potentially avoid unintended weight gain.  The following Lifestyle Medicine recommendations according to American College of Lifestyle Medicine  [facility]) were discussed and and offered to patient and he  agrees to start the journey:  A. Whole Foods, Plant-Based Nutrition comprising of fruits and vegetables, plant-based proteins, whole-grain carbohydrates was discussed in detail with the patient.   A list for source of those nutrients were also provided to the patient.  Patient will use only water or unsweetened tea for hydration. B.  The need to stay away from risky substances including alcohol, smoking; obtaining 7 to 9 hours of restorative sleep, at least 150 minutes of moderate intensity exercise weekly, the importance of healthy social connections,  and stress management techniques were discussed. C.  A full color page of  Calorie density of various food groups per pound showing examples of each food groups was provided to the patient.   - he will be scheduled with Norm Salt, RDN, CDE for individualized diabetes education.  - I have approached him with the following individualized plan to manage  his diabetes and patient agrees:   -Patient presents with continued improvement of his glycemia towards target, however we will continue to benefit  from intensive treatment with basal/bolus insulin.    -Accordingly, I discussed and increase his Lantus to 40 units nightly, continue Humalog  5-8   units 3 times a day with meals  for pre-meal BG readings of 90-150mg /dl, plus patient specific correction dose for unexpected hyperglycemia above 150mg /dl, advised to resume and continue his CGM device.   - he is warned not to take insulin without proper monitoring per orders. - Adjustment parameters are given to him for hypo and hyperglycemia in writing. - he is encouraged to call clinic for blood glucose levels less than 70 or above 300 mg /dl. -He does have CHF and CKD, stands to benefit from SGLT2 inhibitors.  He is advised to continue Jardiance 10 mg p.o. daily at breakfast.     - he will be considered for incretin therapy as appropriate next visit.  - Specific targets for  A1c;  LDL, HDL,  and Triglycerides were discussed with the patient.  2) Blood Pressure /Hypertension:   His blood pressure is controlled to  target.  he is advised to continue his current medications including spironolactone 25 mg p.o. daily at breakfast.  He is also on Lasix 40 mg p.o. daily in the morning and 60 mg p.o. daily in the evening.  3) Lipids/Hyperlipidemia:   Review of his recent lipid panel showed uncontrolled  LDL at 130 .  He also has elevated LP(a) at 182.4.  He is at exceedingly high risk for cardiovascular disease.  He will benefit from moderate dose statin, currently Lipitor 40 mg p.o. nightly. Side effects and precautions discussed with him.  4)  Weight/Diet:  Body mass index is 28.23 kg/m.  -   clearly complicating his diabetes care.   he is  a candidate for weight loss. I discussed with him the fact that loss of 5 - 10% of his  current body weight will have the most impact on his diabetes management.  The above detailed  ACLM recommendations for nutrition, exercise, sleep, social life, avoidance of risky substances, the need for restorative sleep    information will also detailed on discharge instructions.  5) hypothyroidism: The circumstance of his diagnosis are not available today.    He is advised to continue levothyroxine 25 mcg p.o. daily before breakfast.     - We discussed about the correct intake of his thyroid hormone, on empty stomach at fasting, with water, separated by at least 30 minutes from breakfast and other medications,  and separated by more than 4 hours from calcium, iron, multivitamins, acid reflux medications (PPIs). -Patient is made aware of the fact that thyroid hormone replacement is needed for life, dose to be adjusted by periodic monitoring of thyroid function tests.    6) Chronic Care/Health Maintenance:  -he  is on Statin medications and  is encouraged to initiate and continue to follow up with Ophthalmology, Dentist,  Podiatrist at least yearly or according to recommendations, and advised to   stay away from smoking. I have recommended yearly flu vaccine and pneumonia vaccine at least every 5 years; moderate intensity exercise for up to 150 minutes weekly; and  sleep for 7- 9 hours a day.  - he is  advised to maintain close follow up with Marylynn Pearson, FNP for primary care needs, as well as his other providers for optimal and coordinated care.   I spent  41  minutes in the care of the patient today including review of labs from CMP, Lipids, Thyroid Function, Hematology (current and previous including abstractions from other facilities); face-to-face time discussing  his blood glucose readings/logs, discussing hypoglycemia and hyperglycemia episodes and symptoms, medications doses, his options of short and [place] term treatment based on the latest standards of care / guidelines;  discussion about incorporating lifestyle medicine;  and documenting the encounter. Risk reduction counseling performed per USPSTF guidelines to reduce  cardiovascular risk factors.     Please refer to Patient Instructions for Blood  Glucose Monitoring and Insulin/Medications Dosing Guide"  in media tab for additional information. Please  also refer to " Patient Self Inventory" in the Media  tab for reviewed elements of pertinent patient history.  Sheppard Plumber Baugh participated in the discussions, expressed understanding, and voiced agreement with the above plans.  All questions were answered to his satisfaction. he is encouraged to contact clinic should he have any questions or concerns prior to his return visit.    Follow up plan: - Return in about 4 months (around 03/16/2024) for Bring Meter/CGM Device/Logs- A1c in Office.  Marquis Lunch, MD  [facility] [address] [place], [place] [address] Phone: [phone]  Fax: [phone]    11/15/2023, 12:28 PM  This note was partially dictated with voice recognition software. Similar sounding words can be transcribed inadequately or may not  be corrected upon review.

## 2023-11-15 NOTE — Patient Instructions (Signed)

## 2023-11-19 ENCOUNTER — Ambulatory Visit (HOSPITAL_COMMUNITY): Payer: Self-pay | Admitting: Pharmacist

## 2023-11-19 LAB — POCT INR: INR: 2.5 (ref 2.0–3.0)

## 2023-11-26 ENCOUNTER — Ambulatory Visit (HOSPITAL_COMMUNITY): Payer: Self-pay | Admitting: Pharmacist

## 2023-11-26 LAB — POCT INR: INR: 2.4 (ref 2.0–3.0)

## 2023-12-01 ENCOUNTER — Other Ambulatory Visit: Payer: Self-pay | Admitting: "Endocrinology

## 2023-12-03 ENCOUNTER — Ambulatory Visit (HOSPITAL_COMMUNITY): Payer: Self-pay | Admitting: Pharmacist

## 2023-12-03 LAB — POCT INR: INR: 2.7 (ref 2.0–3.0)

## 2023-12-04 ENCOUNTER — Other Ambulatory Visit: Payer: Self-pay | Admitting: Physical Medicine and Rehabilitation

## 2023-12-06 ENCOUNTER — Other Ambulatory Visit: Payer: Self-pay | Admitting: "Endocrinology

## 2023-12-10 ENCOUNTER — Ambulatory Visit (HOSPITAL_COMMUNITY): Payer: Self-pay | Admitting: Pharmacist

## 2023-12-10 LAB — POCT INR: INR: 2.9 (ref 2.0–3.0)

## 2023-12-17 ENCOUNTER — Ambulatory Visit (HOSPITAL_COMMUNITY): Payer: Self-pay | Admitting: Pharmacist

## 2023-12-17 LAB — POCT INR: INR: 2.3 (ref 2.0–3.0)

## 2023-12-18 ENCOUNTER — Encounter (HOSPITAL_COMMUNITY): Payer: 59 | Admitting: Cardiology

## 2023-12-18 ENCOUNTER — Ambulatory Visit (HOSPITAL_COMMUNITY)

## 2023-12-18 ENCOUNTER — Other Ambulatory Visit (HOSPITAL_COMMUNITY)

## 2023-12-21 NOTE — Progress Notes (Signed)
 Remote ICD transmission.

## 2023-12-24 ENCOUNTER — Ambulatory Visit (HOSPITAL_COMMUNITY): Payer: Self-pay | Admitting: Pharmacist

## 2023-12-24 LAB — POCT INR: INR: 1.8 — AB (ref 2.0–3.0)

## 2023-12-25 ENCOUNTER — Other Ambulatory Visit (HOSPITAL_COMMUNITY): Payer: Self-pay

## 2023-12-25 DIAGNOSIS — Z7901 Long term (current) use of anticoagulants: Secondary | ICD-10-CM

## 2023-12-25 DIAGNOSIS — Z95811 Presence of heart assist device: Secondary | ICD-10-CM

## 2023-12-26 ENCOUNTER — Encounter (HOSPITAL_COMMUNITY): Payer: Self-pay | Admitting: Cardiology

## 2023-12-26 ENCOUNTER — Ambulatory Visit (HOSPITAL_COMMUNITY): Payer: Self-pay | Admitting: Pharmacist

## 2023-12-26 ENCOUNTER — Ambulatory Visit (HOSPITAL_COMMUNITY)
Admission: RE | Admit: 2023-12-26 | Discharge: 2023-12-26 | Disposition: A | Discharge status: Home / Self Care | Source: Ambulatory Visit | Attending: Cardiology | Admitting: Cardiology

## 2023-12-26 ENCOUNTER — Ambulatory Visit (HOSPITAL_BASED_OUTPATIENT_CLINIC_OR_DEPARTMENT_OTHER)
Admission: RE | Admit: 2023-12-26 | Discharge: 2023-12-26 | Disposition: A | Discharge status: Home / Self Care | Source: Ambulatory Visit | Attending: Cardiology | Admitting: Cardiology

## 2023-12-26 DIAGNOSIS — Z79899 Other long term (current) drug therapy: Secondary | ICD-10-CM | POA: Insufficient documentation

## 2023-12-26 DIAGNOSIS — I428 Other cardiomyopathies: Secondary | ICD-10-CM | POA: Diagnosis not present

## 2023-12-26 DIAGNOSIS — E1122 Type 2 diabetes mellitus with diabetic chronic kidney disease: Secondary | ICD-10-CM | POA: Insufficient documentation

## 2023-12-26 DIAGNOSIS — I5022 Chronic systolic (congestive) heart failure: Secondary | ICD-10-CM | POA: Diagnosis not present

## 2023-12-26 DIAGNOSIS — Z79890 Hormone replacement therapy (postmenopausal): Secondary | ICD-10-CM | POA: Insufficient documentation

## 2023-12-26 DIAGNOSIS — Z7901 Long term (current) use of anticoagulants: Secondary | ICD-10-CM

## 2023-12-26 DIAGNOSIS — Z4502 Encounter for adjustment and management of automatic implantable cardiac defibrillator: Secondary | ICD-10-CM | POA: Insufficient documentation

## 2023-12-26 DIAGNOSIS — I358 Other nonrheumatic aortic valve disorders: Secondary | ICD-10-CM | POA: Diagnosis not present

## 2023-12-26 DIAGNOSIS — I2510 Atherosclerotic heart disease of native coronary artery without angina pectoris: Secondary | ICD-10-CM | POA: Insufficient documentation

## 2023-12-26 DIAGNOSIS — J449 Chronic obstructive pulmonary disease, unspecified: Secondary | ICD-10-CM | POA: Insufficient documentation

## 2023-12-26 DIAGNOSIS — Z87891 Personal history of nicotine dependence: Secondary | ICD-10-CM | POA: Diagnosis not present

## 2023-12-26 DIAGNOSIS — E039 Hypothyroidism, unspecified: Secondary | ICD-10-CM | POA: Insufficient documentation

## 2023-12-26 DIAGNOSIS — Z7682 Awaiting organ transplant status: Secondary | ICD-10-CM | POA: Insufficient documentation

## 2023-12-26 DIAGNOSIS — I4892 Unspecified atrial flutter: Secondary | ICD-10-CM | POA: Diagnosis not present

## 2023-12-26 DIAGNOSIS — I130 Hypertensive heart and chronic kidney disease with heart failure and stage 1 through stage 4 chronic kidney disease, or unspecified chronic kidney disease: Secondary | ICD-10-CM | POA: Diagnosis not present

## 2023-12-26 DIAGNOSIS — Z95811 Presence of heart assist device: Secondary | ICD-10-CM

## 2023-12-26 DIAGNOSIS — N183 Chronic kidney disease, stage 3 unspecified: Secondary | ICD-10-CM | POA: Insufficient documentation

## 2023-12-26 DIAGNOSIS — Z86718 Personal history of other venous thrombosis and embolism: Secondary | ICD-10-CM | POA: Insufficient documentation

## 2023-12-26 DIAGNOSIS — Z794 Long term (current) use of insulin: Secondary | ICD-10-CM | POA: Insufficient documentation

## 2023-12-26 DIAGNOSIS — D631 Anemia in chronic kidney disease: Secondary | ICD-10-CM | POA: Insufficient documentation

## 2023-12-26 DIAGNOSIS — Z7984 Long term (current) use of oral hypoglycemic drugs: Secondary | ICD-10-CM | POA: Insufficient documentation

## 2023-12-26 DIAGNOSIS — I4891 Unspecified atrial fibrillation: Secondary | ICD-10-CM | POA: Insufficient documentation

## 2023-12-26 LAB — BASIC METABOLIC PANEL WITH GFR
Anion gap: 11 (ref 5–15)
BUN: 22 mg/dL — ABNORMAL HIGH (ref 6–20)
CO2: 27 mmol/L (ref 22–32)
Calcium: 9.2 mg/dL (ref 8.9–10.3)
Chloride: 98 mmol/L (ref 98–111)
Creatinine, Ser: 1.33 mg/dL — ABNORMAL HIGH (ref 0.61–1.24)
GFR, Estimated: >60 mL/min (ref >60)
Glucose, Bld: 61 mg/dL — ABNORMAL LOW (ref 70–99)
Potassium: 3.8 mmol/L (ref 3.5–5.1)
Sodium: 136 mmol/L (ref 135–145)

## 2023-12-26 LAB — CBC
HCT: 48.8 % (ref 39.0–52.0)
Hemoglobin: 15.3 g/dL (ref 13.0–17.0)
MCH: 24.3 pg — ABNORMAL LOW (ref 26.0–34.0)
MCHC: 31.4 g/dL (ref 30.0–36.0)
MCV: 77.6 fL — ABNORMAL LOW (ref 80.0–100.0)
Platelets: 260 10*3/uL (ref 150–400)
RBC: 6.29 MIL/uL — ABNORMAL HIGH (ref 4.22–5.81)
RDW: 22.3 % — ABNORMAL HIGH (ref 11.5–15.5)
WBC: 6.8 10*3/uL (ref 4.0–10.5)
nRBC: 0 % (ref 0.0–0.2)

## 2023-12-26 LAB — ECHOCARDIOGRAM COMPLETE: Est EF: 20

## 2023-12-26 LAB — LACTATE DEHYDROGENASE: LDH: 265 U/L — ABNORMAL HIGH (ref 98–192)

## 2023-12-26 LAB — PROTIME-INR
INR: 1.2 (ref 0.8–1.2)
Prothrombin Time: 15.8 s — ABNORMAL HIGH (ref 11.4–15.2)

## 2023-12-26 NOTE — Patient Instructions (Addendum)
 1. 1 lovenox  40 mg syringe tonight, one tomorrow morning and one tomorrow night then stop. Will also do warfarin 9 mg (3 tabs) tonight to boost  2. Follow up in [facility] in 2 months 3. Coumadin  dosing per Barbra Ley [facility]

## 2023-12-26 NOTE — Progress Notes (Addendum)
 Pt presents for 2 month f/u with 1.5 year Intermacs in VAD clinic today alone. Denies issues with VAD equipment or drive line.   Pt walked into clinic independently. States he is overall feeling well. Denies lightheadedness, falls, and signs of bleeding. Reports intermittent alarms on his LVAD. On interrogation multiple low voltage alarms. Pt states it typically occurs when he is on batteries. VAD Coordinator provided education on low voltage alarms.    Pt endorses compliance in checking his blood sugars- currently wearing Freestyle Libre. He is following up with endocrinology regularly and is working with registered dietitian to help blood sugar control.   Listed for transplant Status 4 at [facility]. He has follow up with [facility] in July.   Ramp echo performed today. Speed remains at 5700 per Dr. Mitzie Anda.  Vital Signs:  Temp:  Doppler: 88     Automatic BP: 114/74 (85)   HR: 79 NSR     SpO2: 94% on RA  Weight: 203.4 lb w/ equip Last weight: 195.2 lb w/ equip  Destination Therapy due to uncontrolled diabetes   LVAD assessment: HM III:  VAD Speed: 5700 rpms Flow: 5.6     Power: 4.7 w       PI: 2.5     Hct: 30   Alarms: none Events: 0-5 PI events daily  Fixed speed: 5400 Low speed limit: 5100  Primary controller: back up battery due for replacement in 9 months Secondary controller: back up battery due for replacement in  months--- not present  I reviewed the LVAD parameters from today and compared the results to the patient's prior recorded data. LVAD interrogation was NEGATIVE for significant power changes, NEGATIVE for clinical alarms and STABLE for PI events/speed drops. No programming changes were made and pump is functioning within specified parameters. Pt is performing daily controller and system monitor self tests along with completing weekly and monthly maintenance for LVAD equipment.   LVAD equipment check completed and is in good working order. Back-up equipment not present  today.   Annual Equipment Maintenance on UBC/PM was performed on 06/2023.   Patient completed 1100 feet during 6 minute walk test. Tolerated well.   Unable to complete neurocognitive trail making  correctly.   [place] Cardiomyopathy, [name]  [place] Cardiomyopathy Questionnaire     12/26/2023    2:59 PM 07/09/2023   11:23 AM 01/01/2023    2:59 PM  [name]  1 a. Ability to shower/bathe Not at all limited Not at all limited Other, Did not do  1 b. Ability to walk 1 block Not at all limited Not at all limited Slightly limited  1 c. Ability to hurry/jog Moderately limited Extremely limited Extremely limited  2. Edema feet/ankles/legs Never over the past 2 weeks Never over the past 2 weeks Less than once a week  3. Limited by fatigue Never over the past 2 weeks Never over the past 2 weeks Less than once a week  4. Limited by dyspnea Never over the past 2 weeks Never over the past 2 weeks Less than once a week  5. Sitting up / on 3+ pillows Never over the past 2 weeks Never over the past 2 weeks Never over the past 2 weeks  6. Limited enjoyment of life Not limited at all Slightly limited Slightly limited  7. Rest of life w/ symptoms Somewhat satisfied Completely satisfied Completely satisfied  8 a. Participation in hobbies Limited quite a bit Moderately limited  Limited quite a bit  8 b. Participation in chores Did not limit at all Did not limit at all Severely limited  8 c. Visiting family/friends Severely limited Slightly limited N/A, did not do for other reasons      Exit Site Care: Existing VAD dressing CDI. Anchor secure. Pt denies issues with dressing changes or tenderness at exit site Pt given 8 weekly kits for home use.  BP & Labs:  Doppler 88 - Doppler is reflecting modified systolic   Hgb 15.3- No S/S of bleeding. Specifically denies melena/BRBPR or nosebleeds.   LDH stable at 265 - Denies tea-colored urine. No  power elevations noted on interrogation.   Patient Instructions: 1. 1 lovenox  40 mg syringe tonight, one tomorrow morning and one tomorrow night then stop. Will also do warfarin 9 mg (3 tabs) tonight to boost  2. Follow up in VAD Clinic in 2 months 3. Coumadin  dosing per [facility]  Laurice Pope RN, BSN VAD Coordinator 24/7 Pager [phone]       ADVANCED HF/VAD CLINIC NOTE  Chief complaint: CHF  Darryl Frye is a 63 y.o. with history of atrial fibrillation/flutter and nonischemic cardiomyopathy who was initially referred by Dr. Amanda Jungling for evaluation of CHF.  Patient was diagnosed with CHF in 2020 at [facility] in [place].  At the time, he drank heavily and used cocaine.  He stopped drinking, smoking, and cocaine in 2020 and has not used any of these substances since then. Echo in 11/20 showed EF < 10%, cath in 12/20 showed normal coronaries.  Echo has shown persistently low EF over time, echo in 2/23 showed EF 20-25%.  He has a Medtronic ICD.      Patient was admitted in 2/23 with atrial flutter/RVR. He was started on amiodarone  and converted to NSR.  He had atrial flutter ablation in 5/23 and amiodarone  was stopped  Echo was done in 11/23 showing elevated filling pressure and markedly low cardiac output.  Patient was admitted, started on milrinone , and underwent HM3 LVAD placement with LAA clipping later in 11/23.  Post-op course complicated by acute hypoxemic respiratory failure post-op and PNA.  Patient went to CIR after acute hospital course.   Ramp echo was done in 4/24, at 5500 bpm, the IV septum was shifted mildly to the left with moderate RV dilation and dysfunction, AoV opened rarely.  Speed decreased to 5400 bpm, slight opening AoV every beat, trivial AI, midline IV septum.  Speed left at 5400 bpm.   Ramp echo was done today.  Speed started at 5400 rpm and was increased to 5600 rpm.  Flow increased from 4.9 L/min to 5.2 L/min.  The aortic valve opened every beat still at 5600 rpm  but the septum started out rightward and ended up midline at 5600 rpm.  Speed was left at 5600 rpm. There was moderate RV dysfunction, IVC normal.   Patient had RHC in 3/25 with low output by thermodilution, preserved by Fick. LVAD speed increased to 5700 rpm.   Ramp echo was done today, slight aortic valve opening with each beat, trivial AI, RV moderately dilated and dysfunctional. Speed was left at 5700 rpm.   Patient returns for LVAD followup.  He feels much better on higher torsemide  and at higher speed. MAP 88.  No exertional dypsnea.  No lightheadedness.  He has been seen at [facility] for transplant evaluation and is undergoing workup.   Labs (12/23): K 4.3, creatinine 1.25 => 1.45, Na 128, hgb 8.8 => 9.6, LDH 211,  LFTs normal, TSH normal, digoxin  0.8 Labs (1/24): K 3.9, creatinine 1.32, LDH 224, hgb 10.4 Labs (2/24): K 4.5, creatinine 1.33 Labs (4/24): creatinine 1.16, hgb 10.9 => 11 Labs (6/24): K 3.8, creatinine 1.32 Labs (7/24): K 4, creatinine 1.39, hgb 12.3, LDH 219 Labs (10/24): K 4.6, creatinine 1.25, hgb 12.1, LDH 183 Labs (12/24): K 4.3, creatinine 1.2, hgb 13.2 Labs (2/25): K 3.8, creatinine 1.18, hgb 14.2 => 13.2  PMH: 1. Atrial fibrillation/flutter: Mainly flutter.  - 5/23 Atrial flutter ablation.  2. H/o DVT 3. Chronic systolic CHF: Nonischemic cardiomyopathy.  Medtronic ICD.  - Echo (11/20): EF < 10% - Cath (12/20): Normal coronaries.  - Echo (8/22): EF 30%, mildly decreased RV systolic function.  - Echo (2/23): EF 20-25%, normal RV.  - RHC (11/23): mean RA 14, PA 66/53 mean 54, mean PCWP 43, CI 1.62 F/1.31 T - HM3 LVAD with LAA clipping on 07/04/22.  - Ramp echo (2/24): Speed increased to 5500 rpm.  At this speed, IV septum was midline.  Aortic valve opened 1/3 beats.  Mild RV enlargement with moderate RV dysfunction.  - Ramp echo (3/24): Speed decreased back to 5400 rpm as the IV septum was shifted left.  Midline septum at 5400 rpm.  - Ramp echo (2/25): Speed  increased to 5600 rpm with IV septum midline at 5600.  Moderate RV dysfunction.  - RHC (3/25): mean RA 8, PA 29/10, mean PCWP 8, CI 2.38 F/1.63 T.  - Ramp echo (5/25): Moderately dilated and dysfunctional RV.  Speed left at 5700 rpm.  4. Prior cocaine abuse.  5. Prior heavy ETOH 6. COPD: Moderate obstruction on 11/23 PFTs. No longer smokes.  7. Type 2 diabetes   SH: Patient was a heavy drinker, abused cocaine, and smoked in the past but quit in 2020.  Lives in [place]. He does part-time custodial work.    FH: Mother with PPM, aunt with CHF.    VAD Indication: Destination Therapy due to uncontrolled diabetes   LVAD assessment: HM III: See LVAD nurse's note above. I reviewed the parameters personally.     ROS: All systems negative except as listed in HPI, PMH and Problem List.  Current Outpatient Medications  Medication Sig Dispense Refill   acetaminophen  (TYLENOL ) 325 MG tablet Take 1-2 tablets (325-650 mg total) by mouth every 4 (four) hours as needed for mild pain.     atorvastatin  (LIPITOR) 40 MG tablet Take 40 mg by mouth daily.     Calcium  Carb-Cholecalciferol  600-10 MG-MCG TABS Take 1 tablet by mouth daily with breakfast. 90 tablet 3   Continuous Glucose Receiver (FREESTYLE LIBRE 3 READER) DEVI 1 each by Does not apply route daily. 2 each 3   Continuous Glucose Sensor (FREESTYLE LIBRE 3 SENSOR) MISC PLACE 1 SENSOR ON THE SKIN EVERY 14 DAYS 2 each 3   digoxin  (LANOXIN ) 0.125 MG tablet Take 0.5 tablets (0.0625 mg total) by mouth daily. 30 tablet 5   empagliflozin  (JARDIANCE ) 10 MG TABS tablet Take 1 tablet (10 mg total) by mouth daily. 30 tablet 6   Fe Fum-Vit C-Vit B12-FA (TRIGELS-F FORTE) CAPS capsule Take 1 capsule by mouth daily after breakfast. 30 capsule 6   ferrous sulfate 325 (65 FE) MG tablet Take 325 mg by mouth daily with breakfast.     insulin  glargine (LANTUS  SOLOSTAR) 100 UNIT/ML Solostar Pen Inject 40 Units into the skin at bedtime. 45 mL 0   insulin  lispro  (HUMALOG ) 100 UNIT/ML KwikPen ADMINISTER 5 TO 8 UNITS UNDER THE  SKIN THREE TIMES DAILY WITH MEALS 30 mL 0   Insulin  Pen Needle (PEN NEEDLES) 32G X 4 MM MISC Use to inject insulin  4 times daily as ordered. 150 each 3   levothyroxine  (SYNTHROID ) 25 MCG tablet Take 1 tablet (25 mcg total) by mouth daily before breakfast. 90 tablet 3   magnesium  oxide (MAG-OX) 400 (240 Mg) MG tablet TAKE 1 TABLET(400 MG) BY MOUTH TWICE DAILY 180 tablet 3   metoprolol  succinate (TOPROL -XL) 25 MG 24 hr tablet Take 25 mg by mouth daily.     Multiple Vitamin (MULTIVITAMIN WITH MINERALS) TABS tablet Take 1 tablet by mouth daily.     pantoprazole  (PROTONIX ) 40 MG tablet Take 1 tablet (40 mg total) by mouth daily. 90 tablet 3   potassium chloride  SA (KLOR-CON  M) 20 MEQ tablet Take 2 tablets (40 mEq total) by mouth daily. 180 tablet 3   sertraline  (ZOLOFT ) 50 MG tablet Take 1 tablet (50 mg total) by mouth daily. 90 tablet 3   sildenafil  (REVATIO ) 20 MG tablet TAKE 1 TABLET(20 MG) BY MOUTH THREE TIMES DAILY 90 tablet 6   SPIRIVA  HANDIHALER 18 MCG inhalation capsule PLACE 1 CAPSULE INTO INHALER AND INHALE EVERY DAY 30 capsule 12   spironolactone  (ALDACTONE ) 25 MG tablet Take 1 tablet (25 mg total) by mouth at bedtime. 90 tablet 3   torsemide  (DEMADEX ) 20 MG tablet Take 3 tablets (60 mg total) by mouth 2 (two) times daily. 180 tablet 10   warfarin (COUMADIN ) 3 MG tablet TAKE 2 TABLETS BY MOUTH EVERY TUESDAY, THURSDAYS AND SATURDAY AND 1 TABLET ALL OTHER DAYS (Patient taking differently: Take 3-6 mg by mouth See admin instructions. 3 mg Mon and Fri, 6 mg all other days) 120 tablet 6   No current facility-administered medications for this encounter.    Patient has no known allergies.  REVIEW OF SYSTEMS: All systems negative except as listed in HPI, PMH and Problem list.  Vital Signs:  MAP 88  Physical Exam: General: Well appearing this am. NAD.  HEENT: Normal. Neck: Supple, JVP 8-9 cm. Carotids OK.  Cardiac:  Mechanical  heart sounds with LVAD hum present.  Lungs:  CTAB, normal effort.  Abdomen:  NT, ND, no HSM. No bruits or masses. +BS  LVAD exit site: Well-healed and incorporated. Dressing dry and intact. No erythema or drainage. Stabilization device present and accurately applied. Driveline dressing changed daily per sterile technique. Extremities:  Warm and dry. No cyanosis, clubbing, rash, or edema.  Neuro:  Alert & oriented x 3. Cranial nerves grossly intact. Moves all 4 extremities w/o difficulty. Affect pleasant    ASSESSMENT AND PLAN:   1. Chronic systolic CHF: Nonischemic cardiomyopathy, diagnosed 2020.  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  Cath in 12/20 with no significant coronary disease.  Medtronic ICD. Echo in 2/23 showed EF 20-25% with normal RV.  RHC in 11/13 showed markedly elevated filling pressures, primarily pulmonary venous hypertension, low cardiac output, and low PAPI. Echo in 11/23 with EF <20%, mod RV dysfunction.  He was admitted and started on milrinone  with improvement in hemodynamics.  We proceeded with HM3 LVAD with LAA clip on 07/04/22.  Ramp echo was done in 2/25, speed increased to 5600 rpm.  RHC in 3/25, speed increased to 5700 rpm.  Ramp echo was done today and reviewed, moderate  RV dilation/moderate RV dysfunction, speed left at 5700 rpm.  LVAD parameters stable.  He is mildly volume overloaded on exam with JVD but NYHA class I-II.  - Continue torsemide  60 mg bid, will not change dose.  BMET today.  - Continue Jardiance  10 mg daily.  - Continue sildenafil  20 tid for RV.  - Continue spironolactone  25 mg daily.   - Continue digoxin  0.0625 daily, check level today.  - He is undergoing evaluation for [facility] at [facility].  I worry about him Adrian-term given development of RV failure.   2. Atrial  fibrillation/atrial flutter: H/o flutter ablation.  Had AF/RVR post-LVAD, went back into NSR.  He is now off amiodarone .  - Continue warfarin.  3. VAD management: s/p HM-3 VAD on 07/04/22. Ramp echo in 4/24, speed decreased to 5400 rpm.  Stable LVAD parameters.  Ramp echo today with speed maintained at 5700.  - Now off ASA.  - Continue warfarin, INR goal 2-2.5. 4. H/o DVT: On anticoagulation.  5. CKD stage 3: On Jardiance .  - BMET today.  6. Hypothyroidism - On levothyroxine .  7. Anemia:   - CBC today.  8. COPD: Moderate on 11/23 PFTs.  Suspect this plays a role in his dyspnea.  - Continue Spiriva .     9. Diabetes: Endocrinology followup.                                                                                                                                                                                                             Followup in 2 months.   I spent 42 minutes reviewing data and LVAD parameters, interviewing patient, and organizing the orders/followup.   Peder Bourdon 12/26/2023

## 2023-12-26 NOTE — Progress Notes (Addendum)
 Speed  Flow  PI  Power  LVIDD  AI  Aortic opening MR  TR  Septum  RV  VTI (>18cm)  5700  5.5 2.8 4.6 5.8 Trivial  5/5 none trivial midline mod  11.4   5800 5.6 2.6 4.7 5.8 trivial 5/5 none trivial Pulling in mod 12.8   5900 5.7 2.5 5.0 5.8 trivial 5/5 none trivial Pulling in mod 11.8                                             Doppler MAP: 88 Auto cuff BP: 114/74 (85)   Ramp ECHO performed at bedside per Dr. Mitzie Anda  At completion of ramp study, patients primary controller programmed:  Fixed speed:5700 Low speed limit:5400   Laurice Pope RN, BSN VAD Coordinator 24/7 Pager [phone]

## 2023-12-26 NOTE — Addendum Note (Signed)
 Encounter addended by: Darlis Eisenmenger, MD on: 12/26/2023 9:21 PM  Actions taken: Clinical Note Signed, Charge Capture section accepted, Level of Service modified

## 2023-12-27 ENCOUNTER — Other Ambulatory Visit (HOSPITAL_COMMUNITY): Payer: Self-pay | Admitting: Pharmacist

## 2023-12-27 ENCOUNTER — Telehealth: Payer: Self-pay | Admitting: Physician Assistant

## 2023-12-27 LAB — DIGOXIN LEVEL: Digoxin Level: <0.2 ng/mL — ABNORMAL LOW (ref 0.8–2.0)

## 2023-12-27 MED ORDER — ENOXAPARIN SODIUM 40 MG/0.4ML IJ SOSY
40.0000 mg | PREFILLED_SYRINGE | Freq: Two times a day (BID) | INTRAMUSCULAR | 0 refills | Status: AC
Start: 2023-12-27 — End: ?

## 2023-12-27 NOTE — Telephone Encounter (Signed)
 Lab called with a correction:  Digoxin  level reported as < 0.2 Lab states the ranges were changed and the level is now reported as < 0.4   Lamond Pilot, PA-C 12/27/2023, 6:03 PM [phone] [facility] [address] [place], [place] [address]

## 2023-12-31 ENCOUNTER — Ambulatory Visit (HOSPITAL_COMMUNITY): Payer: Self-pay | Admitting: Pharmacist

## 2023-12-31 LAB — POCT INR: INR: 2.5 (ref 2.0–3.0)

## 2023-12-31 NOTE — Progress Notes (Deleted)
 [facility] at [facility]  HEMATOLOGY NEW VISIT  Jace Martinet, FNP  REASON FOR REFERRAL: Polycythemia   HISTORY OF PRESENT ILLNESS: Darryl Frye 63 y.o. male referred for ***. The patient is accompanied by    I have reviewed the past medical history, past surgical history, social history and family history with the patient   ALLERGIES:  has no known allergies.  MEDICATIONS:  Current Outpatient Medications  Medication Sig Dispense Refill   acetaminophen  (TYLENOL ) 325 MG tablet Take 1-2 tablets (325-650 mg total) by mouth every 4 (four) hours as needed for mild pain.     atorvastatin  (LIPITOR) 40 MG tablet Take 40 mg by mouth daily.     Calcium  Carb-Cholecalciferol  600-10 MG-MCG TABS Take 1 tablet by mouth daily with breakfast. 90 tablet 3   Continuous Glucose Receiver (FREESTYLE LIBRE 3 READER) DEVI 1 each by Does not apply route daily. 2 each 3   Continuous Glucose Sensor (FREESTYLE LIBRE 3 SENSOR) MISC PLACE 1 SENSOR ON THE SKIN EVERY 14 DAYS 2 each 3   digoxin  (LANOXIN ) 0.125 MG tablet Take 0.5 tablets (0.0625 mg total) by mouth daily. 30 tablet 5   empagliflozin  (JARDIANCE ) 10 MG TABS tablet Take 1 tablet (10 mg total) by mouth daily. 30 tablet 6   enoxaparin  (LOVENOX ) 40 MG/0.4ML injection Inject 0.4 mLs (40 mg total) into the skin every 12 (twelve) hours. 4 mL 0   Fe Fum-Vit C-Vit B12-FA (TRIGELS-F FORTE) CAPS capsule Take 1 capsule by mouth daily after breakfast. 30 capsule 6   ferrous sulfate 325 (65 FE) MG tablet Take 325 mg by mouth daily with breakfast.     insulin  glargine (LANTUS  SOLOSTAR) 100 UNIT/ML Solostar Pen Inject 40 Units into the skin at bedtime. 45 mL 0   insulin  lispro (HUMALOG ) 100 UNIT/ML KwikPen ADMINISTER 5 TO 8 UNITS UNDER THE SKIN THREE TIMES DAILY WITH MEALS 30 mL 0   Insulin  Pen Needle (PEN NEEDLES) 32G X 4 MM MISC Use to inject insulin  4 times daily as ordered. 150 each 3   levothyroxine  (SYNTHROID ) 25 MCG tablet Take 1  tablet (25 mcg total) by mouth daily before breakfast. 90 tablet 3   magnesium  oxide (MAG-OX) 400 (240 Mg) MG tablet TAKE 1 TABLET(400 MG) BY MOUTH TWICE DAILY 180 tablet 3   metoprolol  succinate (TOPROL -XL) 25 MG 24 hr tablet Take 25 mg by mouth daily.     Multiple Vitamin (MULTIVITAMIN WITH MINERALS) TABS tablet Take 1 tablet by mouth daily.     pantoprazole  (PROTONIX ) 40 MG tablet Take 1 tablet (40 mg total) by mouth daily. 90 tablet 3   potassium chloride  SA (KLOR-CON  M) 20 MEQ tablet Take 2 tablets (40 mEq total) by mouth daily. 180 tablet 3   sertraline  (ZOLOFT ) 50 MG tablet Take 1 tablet (50 mg total) by mouth daily. 90 tablet 3   sildenafil  (REVATIO ) 20 MG tablet TAKE 1 TABLET(20 MG) BY MOUTH THREE TIMES DAILY 90 tablet 6   SPIRIVA  HANDIHALER 18 MCG inhalation capsule PLACE 1 CAPSULE INTO INHALER AND INHALE EVERY DAY 30 capsule 12   spironolactone  (ALDACTONE ) 25 MG tablet Take 1 tablet (25 mg total) by mouth at bedtime. 90 tablet 3   torsemide  (DEMADEX ) 20 MG tablet Take 3 tablets (60 mg total) by mouth 2 (two) times daily. 180 tablet 10   warfarin (COUMADIN ) 3 MG tablet TAKE 2 TABLETS BY MOUTH EVERY TUESDAY, THURSDAYS AND SATURDAY AND 1 TABLET ALL OTHER DAYS (Patient taking differently: Take  3-6 mg by mouth See admin instructions. 3 mg Mon and Fri, 6 mg all other days) 120 tablet 6   No current facility-administered medications for this visit.     REVIEW OF SYSTEMS:   Constitutional: Denies fevers, chills or night sweats Eyes: Denies blurriness of vision Ears, nose, mouth, throat, and face: Denies mucositis or sore throat Respiratory: Denies cough, dyspnea or wheezes Cardiovascular: Denies palpitation, chest discomfort or lower extremity swelling Gastrointestinal:  Denies nausea, heartburn or change in bowel habits Skin: Denies abnormal skin rashes Lymphatics: Denies new lymphadenopathy or easy bruising Neurological:Denies numbness, tingling or new weaknesses Behavioral/Psych: Mood  is stable, no new changes  All other systems were reviewed with the patient and are negative.  PHYSICAL EXAMINATION:   There were no vitals filed for this visit.  GENERAL:alert, no distress and comfortable SKIN: skin color, texture, turgor are normal, no rashes or significant lesions EYES: normal, Conjunctiva are pink and non-injected, sclera clear OROPHARYNX:no exudate, no erythema and lips, buccal mucosa, and tongue normal  NECK: supple, thyroid  normal size, non-tender, without nodularity LYMPH:  no palpable lymphadenopathy in the cervical, axillary or inguinal LUNGS: clear to auscultation and percussion with normal breathing effort HEART: regular rate & rhythm and no murmurs and no lower extremity edema ABDOMEN:abdomen soft, non-tender and normal bowel sounds Musculoskeletal:no cyanosis of digits and no clubbing  NEURO: alert & oriented x 3 with fluent speech, no focal motor/sensory deficits  LABORATORY DATA:  I have reviewed the data as listed  Lab Results  Component Value Date   WBC 6.8 12/26/2023   NEUTROABS 6.1 09/25/2023   HGB 15.3 12/26/2023   HCT 48.8 12/26/2023   MCV 77.6 (L) 12/26/2023   PLT 260 12/26/2023    No results found for: "SPEP", "UPEP"     Chemistry      Component Value Date/Time   NA 136 12/26/2023 1104   NA 135 11/23/2021 0814   K 3.8 12/26/2023 1104   CL 98 12/26/2023 1104   CO2 27 12/26/2023 1104   BUN 22 (H) 12/26/2023 1104   BUN 20 11/23/2021 0814   CREATININE 1.33 (H) 12/26/2023 1104      Component Value Date/Time   CALCIUM  9.2 12/26/2023 1104   ALKPHOS 136 (H) 09/25/2023 1843   AST 29 09/25/2023 1843   ALT 31 09/25/2023 1843   BILITOT 0.2 09/25/2023 1843       RADIOGRAPHIC STUDIES: I have personally reviewed the radiological images as listed and agreed with the findings in the report. ECHOCARDIOGRAM COMPLETE    ECHOCARDIOGRAM REPORT       Patient Name:   Darryl Frye Date of Exam: 12/26/2023 Medical Rec #:  [id number]       Height:       71.0 in Accession #:    [id number]     Weight:       202.4 lb Date of Birth:  1961-06-01      BSA:          2.119 m Patient Age:    62 years       BP:           0/0 mmHg Patient Gender: M              HR:           78 bpm. Exam Location:  Inpatient  Procedure: 2D Echo, Cardiac Doppler and Color Doppler (Both Spectral and Color            Flow  Doppler were utilized during procedure).  Indications:    LVAD ramp   History:        Patient has prior history of Echocardiogram examinations, most                 recent 10/16/2023. CHF, CAD; Risk Factors:Hypertension and                 Diabetes.   Sonographer:    Astrid Blamer Referring Phys: [phone] DALTON S MCLEAN  IMPRESSIONS   1. LVAD inflow cannula visualized at the LV apex. Left ventricular ejection fraction, by estimation, is 20%. The left ventricle has severely decreased function. The left ventricle demonstrates global hypokinesis. The left ventricular internal cavity  size was mildly dilated. Left ventricular diastolic parameters are indeterminate. With LVAD speed at 5700 rpm, the interventricular septum appeared midline. With increasing speed up to 5900, the interventricular septum shifted left. Speed was left at  5700 rpm.  2. The aortic valve was mildly calcified and opened slightly with each beat even with increasing speed. There was trivial aortic insufficiency.  3. The mitral valve is normal in structure. Trivial mitral valve regurgitation. No evidence of mitral stenosis.  4. Right atrial size was moderately dilated.  5. Left atrial size was mildly dilated.  6. Right ventricular systolic function is moderately reduced. The right ventricular size is moderately enlarged.  7. The inferior vena cava is normal in size with greater than 50% respiratory variability, suggesting right atrial pressure of 3 mmHg.  FINDINGS  Left Ventricle: LVAD inflow cannula visualized at the LV apex. Left ventricular ejection fraction, by  estimation, is 20%. The left ventricle has severely decreased function. The left ventricle demonstrates global hypokinesis. The left ventricular  internal cavity size was mildly dilated. There is no left ventricular hypertrophy. Left ventricular diastolic parameters are indeterminate.  Right Ventricle: The right ventricular size is moderately enlarged. No increase in right ventricular wall thickness. Right ventricular systolic function is moderately reduced.  Left Atrium: Left atrial size was mildly dilated.  Right Atrium: Right atrial size was moderately dilated.  Pericardium: There is no evidence of pericardial effusion.  Mitral Valve: The mitral valve is normal in structure. Trivial mitral valve regurgitation. No evidence of mitral valve stenosis.  Tricuspid Valve: The tricuspid valve is normal in structure. Tricuspid valve regurgitation is trivial.  Aortic Valve: The aortic valve was mildly calcified and opened slightly with each beat even with increasing speed. There was trivial aortic insufficiency. The aortic valve is abnormal. Aortic valve regurgitation is trivial.  Pulmonic Valve: The pulmonic valve was normal in structure. Pulmonic valve regurgitation is trivial.  Aorta: The aortic root is normal in size and structure.  Venous: The inferior vena cava is normal in size with greater than 50% respiratory variability, suggesting right atrial pressure of 3 mmHg.  IAS/Shunts: No atrial level shunt detected by color flow Doppler.  Additional Comments: A device lead is visualized in the right ventricle.    LEFT VENTRICLE PLAX 2D LVIDd:         5.80 cm LV PW:         0.83 cm LV IVS:        1.52 cm    PULMONIC VALVE PV Vmax:       0.64 m/s PV Vmean:      44.460 cm/s PV VTI:        0.117 m PV Peak grad:  1.6 mmHg PV Mean grad:  1.0 mmHg    Dalton  McleanMD Electronically signed by Archer Bear Signature Date/Time: 12/26/2023/12:10:25 PM      Final     ASSESSMENT &  PLAN:  Patient is a 63 y.o. male referred for ***  No problem-specific Assessment & Plan notes found for this encounter.   No orders of the defined types were placed in this encounter.   The total time spent in the appointment was {CHL ONC TIME VISIT - UUVOZ:[id number]} encounter with patients including review of chart and various tests results, discussions about plan of care and coordination of care plan   All questions were answered. The patient knows to call the clinic with any problems, questions or concerns. No barriers to learning was detected.   Eduardo Grade, MD 5/12/20251:08 PM

## 2024-01-01 ENCOUNTER — Inpatient Hospital Stay: Admitting: Oncology

## 2024-01-01 ENCOUNTER — Inpatient Hospital Stay

## 2024-01-07 ENCOUNTER — Ambulatory Visit (HOSPITAL_COMMUNITY): Payer: Self-pay | Admitting: Pharmacist

## 2024-01-07 LAB — POCT INR: INR: 2.2 (ref 2.0–3.0)

## 2024-01-08 ENCOUNTER — Inpatient Hospital Stay

## 2024-01-08 ENCOUNTER — Inpatient Hospital Stay: Attending: Oncology | Admitting: Oncology

## 2024-01-08 VITALS — BP 106/85 | HR 58 | Temp 97.2°F | Resp 20 | Ht 70.16 in | Wt 205.2 lb

## 2024-01-08 DIAGNOSIS — I509 Heart failure, unspecified: Secondary | ICD-10-CM | POA: Insufficient documentation

## 2024-01-08 DIAGNOSIS — Z87891 Personal history of nicotine dependence: Secondary | ICD-10-CM | POA: Diagnosis not present

## 2024-01-08 DIAGNOSIS — Z95811 Presence of heart assist device: Secondary | ICD-10-CM | POA: Insufficient documentation

## 2024-01-08 DIAGNOSIS — D751 Secondary polycythemia: Secondary | ICD-10-CM | POA: Insufficient documentation

## 2024-01-08 NOTE — Progress Notes (Signed)
 [facility] at [facility] ([facility]  HEMATOLOGY NEW VISIT  Jace Martinet, FNP  REASON FOR REFERRAL: Polycythemia   HISTORY OF PRESENT ILLNESS: Darryl Frye 63 y.o. male referred for polycythemia.  Patient has severe heart failure management left ventricular assistive device.  He had previous symptoms of tiredness, shortness of breath and dizziness prior to LVAD placement but since then all the symptoms have improved significantly.  He has no complaints today.  He denies fever, chills, night sweats, weight loss, loss of appetite.  He denies sleep disturbances, snoring, pruritus after hot shower.  He has a history of anemia, previously treated with iron supplements. Iron pills were discontinued last week after a video consultation due to elevated red blood cell count. The specific cause of prior anemia is unclear, but low iron levels were noted at that time.  He quit smoking four years ago and denies alcohol use. 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 Family history is significant for prostate cancer in his grandfather.  I have reviewed the past medical history, past surgical history, social history and family history with the patient   ALLERGIES:  has no known allergies.  MEDICATIONS:  Current Outpatient Medications  Medication Sig Dispense Refill   acetaminophen  (TYLENOL ) 325 MG tablet Take 1-2 tablets (325-650 mg total) by mouth every 4 (four) hours as needed for mild pain.     atorvastatin  (LIPITOR) 40 MG tablet Take 40 mg by mouth daily.     Calcium  Carb-Cholecalciferol  600-10 MG-MCG TABS Take 1 tablet by mouth daily with breakfast. 90 tablet 3   Continuous Glucose Receiver (FREESTYLE LIBRE 3 READER) DEVI 1 each by Does not apply route daily. 2 each 3   Continuous Glucose Sensor (FREESTYLE LIBRE 3 SENSOR) MISC PLACE 1 SENSOR ON THE SKIN EVERY 14 DAYS 2 each 3   digoxin  (LANOXIN ) 0.125 MG tablet Take 0.5 tablets (0.0625 mg total) by mouth daily. 30 tablet 5    empagliflozin  (JARDIANCE ) 10 MG TABS tablet Take 1 tablet (10 mg total) by mouth daily. 30 tablet 6   enoxaparin  (LOVENOX ) 40 MG/0.4ML injection Inject 0.4 mLs (40 mg total) into the skin every 12 (twelve) hours. 4 mL 0   Fe Fum-Vit C-Vit B12-FA (TRIGELS-F FORTE) CAPS capsule Take 1 capsule by mouth daily after breakfast. 30 capsule 6   insulin  glargine (LANTUS  SOLOSTAR) 100 UNIT/ML Solostar Pen Inject 40 Units into the skin at bedtime. 45 mL 0   insulin  lispro (HUMALOG ) 100 UNIT/ML KwikPen ADMINISTER 5 TO 8 UNITS UNDER THE SKIN THREE TIMES DAILY WITH MEALS 30 mL 0   Insulin  Pen Needle (PEN NEEDLES) 32G X 4 MM MISC Use to inject insulin  4 times daily as ordered. 150 each 3   levothyroxine  (SYNTHROID ) 25 MCG tablet Take 1 tablet (25 mcg total) by mouth daily before breakfast. 90 tablet 3   magnesium  oxide (MAG-OX) 400 (240 Mg) MG tablet TAKE 1 TABLET(400 MG) BY MOUTH TWICE DAILY 180 tablet 3   metoprolol  succinate (TOPROL -XL) 25 MG 24 hr tablet Take 25 mg by mouth daily.     Multiple Vitamin (MULTIVITAMIN WITH MINERALS) TABS tablet Take 1 tablet by mouth daily.     pantoprazole  (PROTONIX ) 40 MG tablet Take 1 tablet (40 mg total) by mouth daily. 90 tablet 3   potassium chloride  SA (KLOR-CON  M) 20 MEQ tablet Take 2 tablets (40 mEq total) by mouth daily. 180 tablet 3   sertraline  (ZOLOFT ) 50 MG tablet Take 1 tablet (50 mg total)  by mouth daily. 90 tablet 3   sildenafil  (REVATIO ) 20 MG tablet TAKE 1 TABLET(20 MG) BY MOUTH THREE TIMES DAILY 90 tablet 6   SPIRIVA  HANDIHALER 18 MCG inhalation capsule PLACE 1 CAPSULE INTO INHALER AND INHALE EVERY DAY 30 capsule 12   spironolactone  (ALDACTONE ) 25 MG tablet Take 1 tablet (25 mg total) by mouth at bedtime. 90 tablet 3   torsemide  (DEMADEX ) 20 MG tablet Take 3 tablets (60 mg total) by mouth 2 (two) times daily. 180 tablet 10   warfarin (COUMADIN ) 3 MG tablet TAKE 2 TABLETS BY MOUTH EVERY TUESDAY, THURSDAYS AND SATURDAY AND 1 TABLET ALL OTHER DAYS (Patient  taking differently: Take 3-6 mg by mouth See admin instructions. 3 mg Mon and Fri, 6 mg all other days) 120 tablet 6   No current facility-administered medications for this visit.     REVIEW OF SYSTEMS:   Constitutional: Denies fevers, chills or night sweats Eyes: Denies blurriness of vision Ears, nose, mouth, throat, and face: Denies mucositis or sore throat Respiratory: Denies cough, dyspnea or wheezes Cardiovascular: Denies palpitation, chest discomfort or lower extremity swelling Gastrointestinal:  Denies nausea, heartburn or change in bowel habits Skin: Denies abnormal skin rashes Lymphatics: Denies new lymphadenopathy or easy bruising Neurological:Denies numbness, tingling or new weaknesses Behavioral/Psych: Mood is stable, no new changes  All other systems were reviewed with the patient and are negative.  PHYSICAL EXAMINATION:   Vitals:   01/08/24 0843  BP: 106/85  Pulse: (!) 58  Resp: 20  Temp: (!) 97.2 F (36.2 C)  SpO2: 100%    GENERAL:alert, no distress and comfortable LYMPH:  no palpable lymphadenopathy in the cervical, axillary or inguinal LUNGS: clear to auscultation and percussion with normal breathing effort HEART: regular rate & rhythm and no murmurs and no lower extremity edema, LVAD in place. ABDOMEN:abdomen soft, non-tender and normal bowel sounds Musculoskeletal:no cyanosis of digits and no clubbing  NEURO: alert & oriented x 3 with fluent speech  LABORATORY DATA:  I have reviewed the data as listed  Lab Results  Component Value Date   WBC 6.8 12/26/2023   NEUTROABS 6.1 09/25/2023   HGB 15.3 12/26/2023   HCT 48.8 12/26/2023   MCV 77.6 (L) 12/26/2023   PLT 260 12/26/2023       Chemistry      Component Value Date/Time   NA 136 12/26/2023 1104   NA 135 11/23/2021 0814   K 3.8 12/26/2023 1104   CL 98 12/26/2023 1104   CO2 27 12/26/2023 1104   BUN 22 (H) 12/26/2023 1104   BUN 20 11/23/2021 0814   CREATININE 1.33 (H) 12/26/2023 1104       Component Value Date/Time   CALCIUM  9.2 12/26/2023 1104   ALKPHOS 136 (H) 09/25/2023 1843   AST 29 09/25/2023 1843   ALT 31 09/25/2023 1843   BILITOT 0.2 09/25/2023 1843      Latest Reference Range & Units 12/26/23 11:04  LDH 98 - 192 U/L 265 (H)  (H): Data is abnormally high  RADIOGRAPHIC STUDIES: I have personally reviewed the radiological report as listed   ECHOCARDIOGRAM COMPLETE    ECHOCARDIOGRAM REPORT       Patient Name:   MALIKE FOGLIO Wagler Date of Exam: 12/26/2023 Medical Rec #:  [id number]      Height:       71.0 in Accession #:    [id number]     Weight:       202.4 lb Date of Birth:  05/19/1961  BSA:          2.119 m Patient Age:    62 years       BP:           0/0 mmHg Patient Gender: M              HR:           78 bpm. Exam Location:  Inpatient  Procedure: 2D Echo, Cardiac Doppler and Color Doppler (Both Spectral and Color            Flow Doppler were utilized during procedure).  Indications:    LVAD ramp   History:        Patient has prior history of Echocardiogram examinations, most                 recent 10/16/2023. CHF, CAD; Risk Factors:Hypertension and                 Diabetes.   Sonographer:    Astrid Blamer Referring Phys: [phone] DALTON S MCLEAN  IMPRESSIONS   1. LVAD inflow cannula visualized at the LV apex. Left ventricular ejection fraction, by estimation, is 20%. The left ventricle has severely decreased function. The left ventricle demonstrates global hypokinesis. The left ventricular internal cavity  size was mildly dilated. Left ventricular diastolic parameters are indeterminate. With LVAD speed at 5700 rpm, the interventricular septum appeared midline. With increasing speed up to 5900, the interventricular septum shifted left. Speed was left at  5700 rpm.  2. The aortic valve was mildly calcified and opened slightly with each beat even with increasing speed. There was trivial aortic insufficiency.  3. The mitral valve is normal in structure.  Trivial mitral valve regurgitation. No evidence of mitral stenosis.  4. Right atrial size was moderately dilated.  5. Left atrial size was mildly dilated.  6. Right ventricular systolic function is moderately reduced. The right ventricular size is moderately enlarged.  7. The inferior vena cava is normal in size with greater than 50% respiratory variability, suggesting right atrial pressure of 3 mmHg.  FINDINGS  Left Ventricle: LVAD inflow cannula visualized at the LV apex. Left ventricular ejection fraction, by estimation, is 20%. The left ventricle has severely decreased function. The left ventricle demonstrates global hypokinesis. The left ventricular  internal cavity size was mildly dilated. There is no left ventricular hypertrophy. Left ventricular diastolic parameters are indeterminate.  Right Ventricle: The right ventricular size is moderately enlarged. No increase in right ventricular wall thickness. Right ventricular systolic function is moderately reduced.  Left Atrium: Left atrial size was mildly dilated.  Right Atrium: Right atrial size was moderately dilated.  Pericardium: There is no evidence of pericardial effusion.  Mitral Valve: The mitral valve is normal in structure. Trivial mitral valve regurgitation. No evidence of mitral valve stenosis.  Tricuspid Valve: The tricuspid valve is normal in structure. Tricuspid valve regurgitation is trivial.  Aortic Valve: The aortic valve was mildly calcified and opened slightly with each beat even with increasing speed. There was trivial aortic insufficiency. The aortic valve is abnormal. Aortic valve regurgitation is trivial.  Pulmonic Valve: The pulmonic valve was normal in structure. Pulmonic valve regurgitation is trivial.  Aorta: The aortic root is normal in size and structure.  Venous: The inferior vena cava is normal in size with greater than 50% respiratory variability, suggesting right atrial pressure of 3  mmHg.  IAS/Shunts: No atrial level shunt detected by color flow Doppler.  Additional Comments: A device lead is  visualized in the right ventricle.    LEFT VENTRICLE PLAX 2D LVIDd:         5.80 cm LV PW:         0.83 cm LV IVS:        1.52 cm    PULMONIC VALVE PV Vmax:       0.64 m/s PV Vmean:      44.460 cm/s PV VTI:        0.117 m PV Peak grad:  1.6 mmHg PV Mean grad:  1.0 mmHg    Dalton McleanMD Electronically signed by Archer Bear Signature Date/Time: 12/26/2023/12:10:25 PM      Final     ASSESSMENT & PLAN:  Patient is a 63 y.o. 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 - Will repeat blood work in 3 months to monitor erythrocyte counts and iron levels - No further intervention needed at this time  Return to clinic in 3 months with labs   Orders Placed This Encounter  Procedures   Ferritin    Standing Status:   Future    Expected Date:   04/07/2024    Expiration Date:   01/07/2025   CBC with Differential/Platelet    Standing Status:   Future    Expected Date:   04/07/2024    Expiration Date:   01/07/2025   Comprehensive metabolic panel with GFR    Standing Status:   Future    Expected Date:   04/07/2024    Expiration Date:   01/07/2025   Iron and TIBC    Standing Status:   Future    Expected Date:   04/07/2024    Expiration Date:   01/07/2025   Erythropoietin    Standing Status:   Future    Expected Date:   04/07/2024    Expiration Date:   01/07/2025    The total time spent in the appointment was 40 minutes encounter with patients including review of chart and various tests results, discussions about plan of care and coordination of care plan   All questions were  answered. The patient knows to call the clinic with any problems, questions or concerns. No barriers to learning was detected.   Eduardo Grade, MD 5/23/20253:29 PM

## 2024-01-08 NOTE — Patient Instructions (Signed)
 VISIT SUMMARY:  Today, you were seen for an elevated red blood cell count. You have severe heart failure managed with a left ventricular assist device (LVAD), and adjustments to the LVAD settings have improved your symptoms. You have a history of anemia, which was previously treated with iron supplements, but these were discontinued last week due to your elevated red blood cell count.  YOUR PLAN:  -POLYCYTHEMIA: Polycythemia is a condition where there are too many red blood cells in your blood. This is likely due to your severe heart failure, which can cause your body to produce more red blood cells to compensate. We will repeat your blood work in three months to monitor your red blood cell count and iron levels.  INSTRUCTIONS:  Please return in three months for repeat blood work to monitor your red blood cell count and iron levels.

## 2024-01-11 DIAGNOSIS — D751 Secondary polycythemia: Secondary | ICD-10-CM | POA: Insufficient documentation

## 2024-01-11 NOTE — Assessment & Plan Note (Signed)
 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 - Will repeat blood work in 3 months to monitor erythrocyte counts and iron levels - No further intervention needed at this time  Return to clinic in 3 months with labs

## 2024-01-15 ENCOUNTER — Ambulatory Visit (HOSPITAL_COMMUNITY): Payer: Self-pay | Admitting: Pharmacist

## 2024-01-15 LAB — POCT INR: INR: 2.7 (ref 2.0–3.0)

## 2024-01-21 ENCOUNTER — Ambulatory Visit (HOSPITAL_COMMUNITY): Payer: Self-pay | Admitting: Pharmacist

## 2024-01-21 LAB — POCT INR: INR: 2.3 (ref 2.0–3.0)

## 2024-01-28 ENCOUNTER — Ambulatory Visit (HOSPITAL_COMMUNITY): Payer: Self-pay | Admitting: Pharmacist

## 2024-01-28 LAB — POCT INR: INR: 2.3 (ref 2.0–3.0)

## 2024-01-30 ENCOUNTER — Telehealth (HOSPITAL_COMMUNITY): Payer: Self-pay | Admitting: *Deleted

## 2024-01-30 NOTE — Telephone Encounter (Signed)
 Received call from pt reporting intermittent beeping low battery yellow diamond alarm. States this has been happening for a while. He is currently on fully charged batteries. Denies alarms when attached to MPU.   Instructed pt to disconnect one lead at a time and reconnect to clip. Once pt made sure power cords properly connected to clips, advised pt to gently shake battery/clip one at a time to see if able to replicate alarming. Beeping occurred with movement of both leads. Advised pt to replace clips with the ones in his back up bag. Beeping resolved with replacement of clips.   Will order pt a new set of clips. Will notify pt when they arrive. Advised to page VAD coordinator with any further issues. He verbalized understanding.   Paulo Bosworth RN VAD Coordinator  Office: [phone]  24/7 Pager: [phone]

## 2024-02-04 ENCOUNTER — Ambulatory Visit (HOSPITAL_COMMUNITY): Payer: Self-pay | Admitting: Pharmacist

## 2024-02-04 ENCOUNTER — Encounter

## 2024-02-04 LAB — POCT INR: INR: 3.7 — AB (ref 2.0–3.0)

## 2024-02-05 ENCOUNTER — Other Ambulatory Visit (HOSPITAL_COMMUNITY): Payer: Self-pay

## 2024-02-05 ENCOUNTER — Ambulatory Visit (HOSPITAL_COMMUNITY)
Admission: RE | Admit: 2024-02-05 | Discharge: 2024-02-05 | Disposition: A | Discharge status: Home / Self Care | Source: Ambulatory Visit | Attending: Cardiology | Admitting: Cardiology

## 2024-02-05 DIAGNOSIS — Z95811 Presence of heart assist device: Secondary | ICD-10-CM

## 2024-02-05 DIAGNOSIS — Z4589 Encounter for adjustment and management of other implanted devices: Secondary | ICD-10-CM | POA: Insufficient documentation

## 2024-02-05 NOTE — Patient Instructions (Signed)
 Return to clinic for previously scheduled appt Notify VAD coordinator immediately if alarming reoccurs.

## 2024-02-05 NOTE — Progress Notes (Addendum)
 Pt presents to clinic for labs ([facility] PRA kit) and to pick up new set of clips.   On arrival pt reports he has continued intermittent beeping low battery/voltage yellow diamond alarm despite being on backup clips and fully charged batteries. Denies alarming on wall power. VAD interrogation confirms multiple low voltage alarms while pt on batteries.   VAD Interrogation: Speed: 5700 Flow: 5.5 Power: 4.6 w PI 3.0  Alarms:multiple low voltage Events: rare  Changed clips to new clips. Alarming continued. All prongs/pins intact in both clips and power cords. Noted that white power cord is missing a chunk of the bend relief and green fluid is noted at bend relief break.     Decision was made to exchange controller. HeartMate 3 controller exchange performed at the bedside to [id number]. Back up battery: [id number]. We discussed that they should never attempt to do this at home without notifying VAD Pager first to triage need and provide support for the patient's safety as they may incur difficulties with the procedure and impair ability to restart pump. I performed elective controller exchange. Patient tolerated controller exchange well and was asymptomatic. Pump restarted as expected with stable VAD parameters on correct prescribed speed of 5700 / 5400 rpms.   Alarming resolved following controller change out. Advised pt to notify VAD coordinator immediately if alarming reoccurs. He verbalized understanding.   Plan:  Return to clinic for previously scheduled appt Notify VAD coordinator immediately if alarming reoccurs.  Paulo Bosworth RN VAD Coordinator  Office: [phone]  24/7 Pager: [phone]

## 2024-02-07 ENCOUNTER — Ambulatory Visit (INDEPENDENT_AMBULATORY_CARE_PROVIDER_SITE_OTHER)

## 2024-02-07 DIAGNOSIS — I509 Heart failure, unspecified: Secondary | ICD-10-CM

## 2024-02-07 LAB — CUP PACEART REMOTE DEVICE CHECK
Battery Remaining Longevity: 74 mo
Battery Voltage: 2.99 V
Brady Statistic AP VP Percent: 0.01 %
Brady Statistic AP VS Percent: 2.72 %
Brady Statistic AS VP Percent: 0.01 %
Brady Statistic AS VS Percent: 97.27 %
Brady Statistic RA Percent Paced: 2.72 %
Brady Statistic RV Percent Paced: 0.01 %
Date Time Interrogation Session: 20250619082726
HighPow Impedance: 74 Ohm
Lead Channel Impedance Value: 304 Ohm
Lead Channel Impedance Value: 418 Ohm
Lead Channel Impedance Value: 456 Ohm
Lead Channel Pacing Threshold Amplitude: 0.625 V
Lead Channel Pacing Threshold Amplitude: 2.5 V
Lead Channel Pacing Threshold Pulse Width: 0.4 ms
Lead Channel Pacing Threshold Pulse Width: 0.4 ms
Lead Channel Sensing Intrinsic Amplitude: 4.625 mV
Lead Channel Sensing Intrinsic Amplitude: 4.625 mV
Lead Channel Sensing Intrinsic Amplitude: 6.25 mV
Lead Channel Sensing Intrinsic Amplitude: 6.25 mV
Lead Channel Setting Pacing Amplitude: 1.5 V
Lead Channel Setting Pacing Amplitude: 3 V
Lead Channel Setting Pacing Pulse Width: 1 ms
Lead Channel Setting Sensing Sensitivity: 0.3 mV
Zone Setting Status: 755011
Zone Setting Status: 755011

## 2024-02-10 ENCOUNTER — Ambulatory Visit: Payer: Self-pay | Admitting: Internal Medicine

## 2024-02-11 ENCOUNTER — Ambulatory Visit (HOSPITAL_COMMUNITY): Payer: Self-pay | Admitting: Pharmacist

## 2024-02-11 ENCOUNTER — Ambulatory Visit: Payer: Self-pay | Admitting: Pharmacist

## 2024-02-11 LAB — POCT INR: INR: 2.6 (ref 2.0–3.0)

## 2024-02-19 ENCOUNTER — Ambulatory Visit (HOSPITAL_COMMUNITY): Payer: Self-pay | Admitting: Pharmacist

## 2024-02-19 LAB — POCT INR: INR: 3.2 — AB (ref 2.0–3.0)

## 2024-02-21 ENCOUNTER — Other Ambulatory Visit: Payer: Self-pay

## 2024-02-21 ENCOUNTER — Other Ambulatory Visit (HOSPITAL_COMMUNITY): Payer: Self-pay

## 2024-02-21 DIAGNOSIS — Z95811 Presence of heart assist device: Secondary | ICD-10-CM

## 2024-02-21 DIAGNOSIS — Z7901 Long term (current) use of anticoagulants: Secondary | ICD-10-CM

## 2024-02-25 ENCOUNTER — Ambulatory Visit (HOSPITAL_COMMUNITY): Payer: Self-pay | Admitting: Cardiology

## 2024-02-25 ENCOUNTER — Other Ambulatory Visit (HOSPITAL_COMMUNITY): Payer: Self-pay

## 2024-02-25 ENCOUNTER — Ambulatory Visit (HOSPITAL_COMMUNITY): Payer: Self-pay | Admitting: Pharmacist

## 2024-02-25 ENCOUNTER — Ambulatory Visit (HOSPITAL_COMMUNITY)
Admission: RE | Admit: 2024-02-25 | Discharge: 2024-02-25 | Disposition: A | Discharge status: Home / Self Care | Source: Ambulatory Visit | Attending: Cardiology | Admitting: Cardiology

## 2024-02-25 VITALS — BP 100/0 | HR 77 | Wt 207.6 lb

## 2024-02-25 DIAGNOSIS — Z7901 Long term (current) use of anticoagulants: Secondary | ICD-10-CM | POA: Diagnosis not present

## 2024-02-25 DIAGNOSIS — N183 Chronic kidney disease, stage 3 unspecified: Secondary | ICD-10-CM | POA: Insufficient documentation

## 2024-02-25 DIAGNOSIS — I428 Other cardiomyopathies: Secondary | ICD-10-CM | POA: Diagnosis not present

## 2024-02-25 DIAGNOSIS — Z95810 Presence of automatic (implantable) cardiac defibrillator: Secondary | ICD-10-CM | POA: Insufficient documentation

## 2024-02-25 DIAGNOSIS — I5043 Acute on chronic combined systolic (congestive) and diastolic (congestive) heart failure: Secondary | ICD-10-CM

## 2024-02-25 DIAGNOSIS — Z8249 Family history of ischemic heart disease and other diseases of the circulatory system: Secondary | ICD-10-CM | POA: Insufficient documentation

## 2024-02-25 DIAGNOSIS — Z95811 Presence of heart assist device: Secondary | ICD-10-CM | POA: Insufficient documentation

## 2024-02-25 DIAGNOSIS — Z79890 Hormone replacement therapy (postmenopausal): Secondary | ICD-10-CM | POA: Diagnosis not present

## 2024-02-25 DIAGNOSIS — I4892 Unspecified atrial flutter: Secondary | ICD-10-CM | POA: Diagnosis not present

## 2024-02-25 DIAGNOSIS — Z79899 Other long term (current) drug therapy: Secondary | ICD-10-CM | POA: Diagnosis not present

## 2024-02-25 DIAGNOSIS — Z794 Long term (current) use of insulin: Secondary | ICD-10-CM | POA: Diagnosis not present

## 2024-02-25 DIAGNOSIS — E039 Hypothyroidism, unspecified: Secondary | ICD-10-CM | POA: Insufficient documentation

## 2024-02-25 DIAGNOSIS — Z7682 Awaiting organ transplant status: Secondary | ICD-10-CM | POA: Insufficient documentation

## 2024-02-25 DIAGNOSIS — D649 Anemia, unspecified: Secondary | ICD-10-CM | POA: Diagnosis not present

## 2024-02-25 DIAGNOSIS — E1122 Type 2 diabetes mellitus with diabetic chronic kidney disease: Secondary | ICD-10-CM | POA: Diagnosis not present

## 2024-02-25 DIAGNOSIS — J449 Chronic obstructive pulmonary disease, unspecified: Secondary | ICD-10-CM | POA: Diagnosis not present

## 2024-02-25 DIAGNOSIS — Z4509 Encounter for adjustment and management of other cardiac device: Secondary | ICD-10-CM | POA: Diagnosis present

## 2024-02-25 DIAGNOSIS — I5022 Chronic systolic (congestive) heart failure: Secondary | ICD-10-CM | POA: Diagnosis not present

## 2024-02-25 DIAGNOSIS — Z87891 Personal history of nicotine dependence: Secondary | ICD-10-CM | POA: Diagnosis not present

## 2024-02-25 DIAGNOSIS — I4891 Unspecified atrial fibrillation: Secondary | ICD-10-CM | POA: Insufficient documentation

## 2024-02-25 DIAGNOSIS — Z7984 Long term (current) use of oral hypoglycemic drugs: Secondary | ICD-10-CM | POA: Diagnosis not present

## 2024-02-25 LAB — BASIC METABOLIC PANEL WITH GFR
Anion gap: 12 (ref 5–15)
BUN: 27 mg/dL — ABNORMAL HIGH (ref 6–20)
CO2: 27 mmol/L (ref 22–32)
Calcium: 9.2 mg/dL (ref 8.9–10.3)
Chloride: 100 mmol/L (ref 98–111)
Creatinine, Ser: 1.09 mg/dL (ref 0.61–1.24)
GFR, Estimated: >60 mL/min (ref >60)
Glucose, Bld: 120 mg/dL — ABNORMAL HIGH (ref 70–99)
Potassium: 4.4 mmol/L (ref 3.5–5.1)
Sodium: 139 mmol/L (ref 135–145)

## 2024-02-25 LAB — CBC
HCT: 49.2 % (ref 39.0–52.0)
Hemoglobin: 15.8 g/dL (ref 13.0–17.0)
MCH: 26.2 pg (ref 26.0–34.0)
MCHC: 32.1 g/dL (ref 30.0–36.0)
MCV: 81.5 fL (ref 80.0–100.0)
Platelets: 307 K/uL (ref 150–400)
RBC: 6.04 MIL/uL — ABNORMAL HIGH (ref 4.22–5.81)
RDW: 18.3 % — ABNORMAL HIGH (ref 11.5–15.5)
WBC: 6.8 K/uL (ref 4.0–10.5)
nRBC: 0 % (ref 0.0–0.2)

## 2024-02-25 LAB — PROTIME-INR
INR: 2 — ABNORMAL HIGH (ref 0.8–1.2)
Prothrombin Time: 23.7 s — ABNORMAL HIGH (ref 11.4–15.2)

## 2024-02-25 LAB — LACTATE DEHYDROGENASE: LDH: 228 U/L — ABNORMAL HIGH (ref 98–192)

## 2024-02-25 LAB — POCT INR: INR: 2.7 (ref 2.0–3.0)

## 2024-02-25 LAB — DIGOXIN LEVEL: Digoxin Level: <0.4 ng/mL — ABNORMAL LOW (ref 0.8–2.0)

## 2024-02-25 MED ORDER — SACUBITRIL-VALSARTAN 24-26 MG PO TABS: 1.0000 | ORAL_TABLET | Freq: Two times a day (BID) | ORAL | 3 refills | Status: DC

## 2024-02-25 NOTE — Patient Instructions (Signed)
 Discontinued Metoprolol   Add Entresto  24-26 BID BMET 10 days at [facility] Return in 2 months to see Dr. Rolan

## 2024-02-25 NOTE — Progress Notes (Addendum)
 Pt presents for 2 month f/u in VAD clinic today alone. Denies issues with VAD equipment or drive line.   Pt walked into clinic independently. States he is overall feeling well. Denies lightheadedness, falls, and signs of bleeding.    Listed for transplant Status 4 at [facility]. He has follow up with [facility] in 03/27/24.  Pt hypertensive today. Stop Metoprolol  and start Entresto  24-26 BID per Dr. Rolan. BMET in 10 days. Rx sent to [facility] on [address] in [place].    Digoxin  level added on to labs collected today.  Vital Signs:  Temp:  Doppler: 100     Automatic BP: 107/87 (96)   HR: 77 NSR     SpO2: 97% on RA  Weight: 207.6 lb w/ equip Last weight: 203.4 lb w/ equip  Destination Therapy due to uncontrolled diabetes   LVAD assessment: HM III:  VAD Speed: 5700 rpms Flow: 5.6     Power: 4.6 w       PI: 2.5     Hct: 40   Alarms: none Events: 0-5 PI events daily  Fixed speed: 5700 Low speed limit: 5300  Primary controller: back up battery due for replacement in 7 months Secondary controller: back up battery due for replacement in 7 months   I reviewed the LVAD parameters from today and compared the results to the patient's prior recorded data. LVAD interrogation was NEGATIVE for significant power changes, NEGATIVE for clinical alarms and STABLE for PI events/speed drops. No programming changes were made and pump is functioning within specified parameters. Pt is performing daily controller and system monitor self tests along with completing weekly and monthly maintenance for LVAD equipment.   LVAD equipment check completed and is in good working order. Back-up equipment not present today.   Annual Equipment Maintenance on UBC/PM was performed on 06/2023.   Exit Site Care: Existing VAD dressing CDI. Anchor secure. Pt denies issues with dressing changes or tenderness at exit site Pt given 8 weekly kits and 2 boxes of tegaderm for home use.  BP & Labs:  Doppler 100 - Doppler is  reflecting modified systolic   Hgb 15.8- No S/S of bleeding. Specifically denies melena/BRBPR or nosebleeds.   LDH stable at 228 - Denies tea-colored urine. No power elevations noted on interrogation.   Patient Instructions: Discontinued Metoprolol   Add Entresto  24-26 twice a day Lab work in 10 days at [facility] ([facility] Return in 2 months to see Dr. Rolan Schuyler Lunger RN, BSN VAD Coordinator 24/7 Pager [phone]      ADVANCED HF/VAD CLINIC NOTE  Chief complaint: CHF  Mr Zorn is a 63 y.o. with history of atrial fibrillation/flutter and nonischemic cardiomyopathy who was initially referred by Dr. Alvan for evaluation of CHF.  Patient was diagnosed with CHF in 2020 at [facility] in [place].  At the time, he drank heavily and used cocaine.  He stopped drinking, smoking, and cocaine in 2020 and has not used any of these substances since then. Echo in 11/20 showed EF < 10%, cath in 12/20 showed normal coronaries.  Echo has shown persistently low EF over time, echo in 2/23 showed EF 20-25%.  He has a Medtronic ICD.      Patient was admitted in 2/23 with atrial flutter/RVR. He was started on amiodarone  and converted to NSR.  He had atrial flutter ablation in 5/23 and amiodarone  was stopped  Echo was done in 11/23 showing elevated filling pressure and markedly low cardiac output.  Patient was admitted, started  on milrinone , and underwent HM3 LVAD placement with LAA clipping later in 11/23.  Post-op course complicated by acute hypoxemic respiratory failure post-op and PNA.  Patient went to CIR after acute hospital course.   Ramp echo was done in 4/24, at 5500 bpm, the IV septum was shifted mildly to the left with moderate RV dilation and dysfunction, AoV opened rarely.  Speed decreased to 5400 bpm, slight opening AoV every beat, trivial AI, midline IV septum.  Speed left at 5400 bpm.   Ramp echo was done today.  Speed started at 5400 rpm and was increased to 5600 rpm.  Flow increased from  4.9 L/min to 5.2 L/min.  The aortic valve opened every beat still at 5600 rpm but the septum started out rightward and ended up midline at 5600 rpm.  Speed was left at 5600 rpm. There was moderate RV dysfunction, IVC normal.   Patient had RHC in 3/25 with low output by thermodilution, preserved by Fick. LVAD speed increased to 5700 rpm.   Ramp echo was done in 5/25, slight aortic valve opening with each beat, trivial AI, RV moderately dilated and dysfunctional. Speed was left at 5700 rpm.   Patient returns for LVAD followup.  He is symptomatically doing well today.  No dyspnea with usual activities.  He is working a part-time job as a Arboriculturist.  No lightheadedness.  No BRBPR/melena.  No driveline site drainage.  MAP 96 today. Weight up 4 lbs.   Labs (12/23): K 4.3, creatinine 1.25 => 1.45, Na 128, hgb 8.8 => 9.6, LDH 211, LFTs normal, TSH normal, digoxin  0.8 Labs (1/24): K 3.9, creatinine 1.32, LDH 224, hgb 10.4 Labs (2/24): K 4.5, creatinine 1.33 Labs (4/24): creatinine 1.16, hgb 10.9 => 11 Labs (6/24): K 3.8, creatinine 1.32 Labs (7/24): K 4, creatinine 1.39, hgb 12.3, LDH 219 Labs (10/24): K 4.6, creatinine 1.25, hgb 12.1, LDH 183 Labs (12/24): K 4.3, creatinine 1.2, hgb 13.2 Labs (2/25): K 3.8, creatinine 1.18, hgb 14.2 => 13.2 Labs (5/25): K 3.8, creatinine 1.33, hgb 15.3, LDH 265  PMH: 1. Atrial fibrillation/flutter: Mainly flutter.  - 5/23 Atrial flutter ablation.  2. H/o DVT 3. Chronic systolic CHF: Nonischemic cardiomyopathy.  Medtronic ICD.  - Echo (11/20): EF < 10% - Cath (12/20): Normal coronaries.  - Echo (8/22): EF 30%, mildly decreased RV systolic function.  - Echo (2/23): EF 20-25%, normal RV.  - RHC (11/23): mean RA 14, PA 66/53 mean 54, mean PCWP 43, CI 1.62 F/1.31 T - HM3 LVAD with LAA clipping on 07/04/22.  - Ramp echo (2/24): Speed increased to 5500 rpm.  At this speed, IV septum was midline.  Aortic valve opened 1/3 beats.  Mild RV enlargement with moderate RV  dysfunction.  - Ramp echo (3/24): Speed decreased back to 5400 rpm as the IV septum was shifted left.  Midline septum at 5400 rpm.  - Ramp echo (2/25): Speed increased to 5600 rpm with IV septum midline at 5600.  Moderate RV dysfunction.  - RHC (3/25): mean RA 8, PA 29/10, mean PCWP 8, CI 2.38 F/1.63 T.  - Ramp echo (5/25): Moderately dilated and dysfunctional RV.  Speed left at 5700 rpm.  4. Prior cocaine abuse.  5. Prior heavy ETOH 6. COPD: Moderate obstruction on 11/23 PFTs. No longer smokes.  7. Type 2 diabetes   SH: Patient was a heavy drinker, abused cocaine, and smoked in the past but quit in 2020.  Lives in [place]. He does part-time custodial work.  FH: Mother with PPM, aunt with CHF.    VAD Indication: Destination Therapy due to uncontrolled diabetes   LVAD assessment: HM III: See LVAD nurse's note above. I reviewed the parameters personally.      ROS: All systems negative except as listed in HPI, PMH and Problem List.  Current Outpatient Medications  Medication Sig Dispense Refill   acetaminophen  (TYLENOL ) 325 MG tablet Take 1-2 tablets (325-650 mg total) by mouth every 4 (four) hours as needed for mild pain.     atorvastatin  (LIPITOR) 40 MG tablet Take 40 mg by mouth daily.     Calcium  Carb-Cholecalciferol  600-10 MG-MCG TABS Take 1 tablet by mouth daily with breakfast. 90 tablet 3   Continuous Glucose Receiver (FREESTYLE LIBRE 3 READER) DEVI 1 each by Does not apply route daily. 2 each 3   Continuous Glucose Sensor (FREESTYLE LIBRE 3 SENSOR) MISC PLACE 1 SENSOR ON THE SKIN EVERY 14 DAYS 2 each 3   digoxin  (LANOXIN ) 0.125 MG tablet Take 0.5 tablets (0.0625 mg total) by mouth daily. 30 tablet 5   empagliflozin  (JARDIANCE ) 10 MG TABS tablet Take 1 tablet (10 mg total) by mouth daily. 30 tablet 6   insulin  glargine (LANTUS  SOLOSTAR) 100 UNIT/ML Solostar Pen Inject 40 Units into the skin at bedtime. 45 mL 0   insulin  lispro (HUMALOG ) 100 UNIT/ML KwikPen ADMINISTER 5 TO 8  UNITS UNDER THE SKIN THREE TIMES DAILY WITH MEALS 30 mL 0   Insulin  Pen Needle (PEN NEEDLES) 32G X 4 MM MISC Use to inject insulin  4 times daily as ordered. 150 each 3   levothyroxine  (SYNTHROID ) 25 MCG tablet Take 1 tablet (25 mcg total) by mouth daily before breakfast. 90 tablet 3   magnesium  oxide (MAG-OX) 400 (240 Mg) MG tablet TAKE 1 TABLET(400 MG) BY MOUTH TWICE DAILY 180 tablet 3   Multiple Vitamin (MULTIVITAMIN WITH MINERALS) TABS tablet Take 1 tablet by mouth daily.     pantoprazole  (PROTONIX ) 40 MG tablet Take 1 tablet (40 mg total) by mouth daily. 90 tablet 3   potassium chloride  SA (KLOR-CON  M) 20 MEQ tablet Take 2 tablets (40 mEq total) by mouth daily. 180 tablet 3   sacubitril -valsartan  (ENTRESTO ) 24-26 MG Take 1 tablet by mouth 2 (two) times daily. 60 tablet 3   sertraline  (ZOLOFT ) 50 MG tablet Take 1 tablet (50 mg total) by mouth daily. 90 tablet 3   sildenafil  (REVATIO ) 20 MG tablet TAKE 1 TABLET(20 MG) BY MOUTH THREE TIMES DAILY 90 tablet 6   SPIRIVA  HANDIHALER 18 MCG inhalation capsule PLACE 1 CAPSULE INTO INHALER AND INHALE EVERY DAY 30 capsule 12   spironolactone  (ALDACTONE ) 25 MG tablet Take 1 tablet (25 mg total) by mouth at bedtime. 90 tablet 3   torsemide  (DEMADEX ) 20 MG tablet Take 3 tablets (60 mg total) by mouth 2 (two) times daily. 180 tablet 10   warfarin (COUMADIN ) 3 MG tablet TAKE 2 TABLETS BY MOUTH EVERY TUESDAY, THURSDAYS AND SATURDAY AND 1 TABLET ALL OTHER DAYS 120 tablet 6   enoxaparin  (LOVENOX ) 40 MG/0.4ML injection Inject 0.4 mLs (40 mg total) into the skin every 12 (twelve) hours. (Patient not taking: Reported on 02/25/2024) 4 mL 0   Fe Fum-Vit C-Vit B12-FA (TRIGELS-F FORTE) CAPS capsule Take 1 capsule by mouth daily after breakfast. (Patient not taking: Reported on 02/25/2024) 30 capsule 6   No current facility-administered medications for this encounter.    Patient has no known allergies.  Vital Signs: BP (!) 100/0   Pulse 77  Wt 94.2 kg (207 lb 9.6 oz)    SpO2 97%   BMI 29.65 kg/m  MAP 96  Physical Exam: General: Well appearing this am. NAD.  HEENT: Normal. Neck: Supple, JVP 8-9 cm. Carotids OK.  Cardiac:  Mechanical heart sounds with LVAD hum present.  Lungs:  CTAB, normal effort.  Abdomen:  NT, ND, no HSM. No bruits or masses. +BS  LVAD exit site: Well-healed and incorporated. Dressing dry and intact. No erythema or drainage. Stabilization device present and accurately applied. Driveline dressing changed daily per sterile technique. Extremities:  Warm and dry. No cyanosis, clubbing, rash, or edema.  Neuro:  Alert & oriented x 3. Cranial nerves grossly intact. Moves all 4 extremities w/o difficulty. Affect pleasant    ASSESSMENT AND PLAN:   1. Chronic systolic CHF: Nonischemic cardiomyopathy, diagnosed 2020.  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  Cath in 12/20 with no significant coronary disease.  Medtronic ICD. Echo in 2/23 showed EF 20-25% with normal RV.  RHC in 11/13 showed markedly elevated filling pressures, primarily pulmonary venous hypertension, low cardiac output, and low PAPI. Echo in 11/23 with EF <20%, mod RV dysfunction.  He was admitted and started on milrinone  with improvement in hemodynamics.  We proceeded with HM3 LVAD with LAA clip on 07/04/22.  Ramp echo was done in 2/25, speed increased to 5600 rpm.  RHC in 3/25, speed increased to 5700 rpm.  Ramp echo in 5/25 showed moderate RV dilation/moderate RV dysfunction, speed left at 5700 rpm.  LVAD parameters reviewed and stable.  He is mildly volume overloaded on exam with JVD but NYHA class I-II.  Weight up 4 lbs. MAP 96.  - Continue torsemide  60 mg bid, will not change dose.  BMET today.  - With elevated MAP and mild volume overload, I will add Entresto  24/26 bid. BMET 10 days.  - Continue  Jardiance  10 mg daily.  - Continue sildenafil  20 tid for RV.  - Continue spironolactone  25 mg daily.   - Continue digoxin  0.0625 daily, check level today.  - Given RV dysfunction, I am going to have him stop Toprol  XL for now.  - He is now on the transplant list at [facility].  I worry about him Gerlich-term with an LVAD given development of RV failure.   2. Atrial fibrillation/atrial flutter: H/o flutter ablation.  Had AF/RVR post-LVAD, went back into NSR.  He is now off amiodarone .  - Continue warfarin.  3. VAD management: s/p HM-3 VAD on 07/04/22. Ramp echo in 4/24, speed decreased to 5400 rpm.  Stable LVAD parameters.  Ramp echo in 5/25 with speed maintained at 5700.  - Now off ASA.  - Continue warfarin, INR goal 2-2.5. 4. H/o DVT: On anticoagulation.  5. CKD stage 3: On Jardiance .  - BMET today.  6. Hypothyroidism - On levothyroxine .  7. Anemia:   - CBC today.  8. COPD: Moderate on 11/23 PFTs.  Suspect this plays a role in his dyspnea.  - Continue Spiriva .     9. Diabetes: Endocrinology followup.  Followup in 2 months.   I spent 41 minutes reviewing data and LVAD parameters, interviewing patient, and organizing the orders/followup.   Ezra Shuck 02/25/2024

## 2024-02-28 ENCOUNTER — Other Ambulatory Visit (HOSPITAL_COMMUNITY): Payer: Self-pay

## 2024-02-28 DIAGNOSIS — I5043 Acute on chronic combined systolic (congestive) and diastolic (congestive) heart failure: Secondary | ICD-10-CM

## 2024-02-28 DIAGNOSIS — Z95811 Presence of heart assist device: Secondary | ICD-10-CM

## 2024-03-03 ENCOUNTER — Ambulatory Visit (HOSPITAL_COMMUNITY): Payer: Self-pay | Admitting: Pharmacist

## 2024-03-06 ENCOUNTER — Other Ambulatory Visit (HOSPITAL_COMMUNITY)
Admission: RE | Admit: 2024-03-06 | Discharge: 2024-03-06 | Disposition: A | Discharge status: Home / Self Care | Source: Ambulatory Visit | Attending: Cardiology | Admitting: Cardiology

## 2024-03-06 ENCOUNTER — Ambulatory Visit (HOSPITAL_COMMUNITY): Payer: Self-pay | Admitting: Cardiology

## 2024-03-06 DIAGNOSIS — I5043 Acute on chronic combined systolic (congestive) and diastolic (congestive) heart failure: Secondary | ICD-10-CM | POA: Diagnosis present

## 2024-03-06 DIAGNOSIS — Z95811 Presence of heart assist device: Secondary | ICD-10-CM | POA: Insufficient documentation

## 2024-03-06 LAB — BASIC METABOLIC PANEL WITH GFR
Anion gap: 10 (ref 5–15)
BUN: 24 mg/dL — ABNORMAL HIGH (ref 6–20)
CO2: 26 mmol/L (ref 22–32)
Calcium: 9.2 mg/dL (ref 8.9–10.3)
Chloride: 102 mmol/L (ref 98–111)
Creatinine, Ser: 1.24 mg/dL (ref 0.61–1.24)
GFR, Estimated: >60 mL/min (ref >60)
Glucose, Bld: 119 mg/dL — ABNORMAL HIGH (ref 70–99)
Potassium: 4 mmol/L (ref 3.5–5.1)
Sodium: 138 mmol/L (ref 135–145)

## 2024-03-10 ENCOUNTER — Ambulatory Visit (HOSPITAL_COMMUNITY): Payer: Self-pay | Admitting: Pharmacist

## 2024-03-10 LAB — POCT INR: INR: 2 (ref 2.0–3.0)

## 2024-03-17 ENCOUNTER — Ambulatory Visit (HOSPITAL_COMMUNITY): Payer: Self-pay | Admitting: Pharmacist

## 2024-03-17 LAB — POCT INR: INR: 2.2 (ref 2.0–3.0)

## 2024-03-20 ENCOUNTER — Encounter: Payer: Self-pay | Admitting: "Endocrinology

## 2024-03-20 ENCOUNTER — Ambulatory Visit (INDEPENDENT_AMBULATORY_CARE_PROVIDER_SITE_OTHER): Admitting: "Endocrinology

## 2024-03-20 VITALS — Ht 70.0 in | Wt 204.8 lb

## 2024-03-20 DIAGNOSIS — I10 Essential (primary) hypertension: Secondary | ICD-10-CM

## 2024-03-20 DIAGNOSIS — I509 Heart failure, unspecified: Secondary | ICD-10-CM

## 2024-03-20 DIAGNOSIS — Z794 Long term (current) use of insulin: Secondary | ICD-10-CM

## 2024-03-20 DIAGNOSIS — E1169 Type 2 diabetes mellitus with other specified complication: Secondary | ICD-10-CM | POA: Diagnosis not present

## 2024-03-20 DIAGNOSIS — Z95811 Presence of heart assist device: Secondary | ICD-10-CM

## 2024-03-20 DIAGNOSIS — E782 Mixed hyperlipidemia: Secondary | ICD-10-CM | POA: Diagnosis not present

## 2024-03-20 LAB — POCT GLYCOSYLATED HEMOGLOBIN (HGB A1C): HbA1c, POC (controlled diabetic range): 7.7 % — AB (ref 0.0–7.0)

## 2024-03-20 MED ORDER — INSULIN LISPRO (1 UNIT DIAL) 100 UNIT/ML (KWIKPEN): PEN_INJECTOR | SUBCUTANEOUS | 0 refills | Status: AC

## 2024-03-20 MED ORDER — EMPAGLIFLOZIN 25 MG PO TABS: 25.0000 mg | ORAL_TABLET | Freq: Every day | ORAL | 1 refills | Status: DC

## 2024-03-20 MED ORDER — LANTUS SOLOSTAR 100 UNIT/ML ~~LOC~~ SOPN: 40.0000 [IU] | PEN_INJECTOR | Freq: Every day | SUBCUTANEOUS | 1 refills | Status: AC

## 2024-03-20 NOTE — Progress Notes (Signed)
 03/20/2024, 12:38 PM  Endocrinology follow-up note   Subjective:    Patient ID: Darryl Frye, male    DOB: 28-Jul-1961.  Darryl Frye is being seen in follow up after he was seen in  consultation for management of currently uncontrolled symptomatic diabetes requested by  Vick Lurie, FNP.   Past Medical History:  Diagnosis Date   Acute kidney injury (HCC)    Acute respiratory failure (HCC)    Atrial flutter (HCC)    on Eliquis    CHF (congestive heart failure) (HCC)    EF 20-25% 10/15/21, AICD in place.   Coronary artery disease    Diabetes mellitus without complication (HCC)    DVT (deep venous thrombosis) (HCC) 06/2019   Heart murmur    Hypertension    Presence of permanent cardiac pacemaker    Renal disorder     Past Surgical History:  Procedure Laterality Date   A-FLUTTER ABLATION N/A 01/12/2022   Procedure: A-FLUTTER ABLATION;  Surgeon: Waddell Danelle ORN, MD;  Location: [facility] INVASIVE CV LAB;  Service: Cardiovascular;  Laterality: N/A;   COLONOSCOPY     Dr. Tobie; One 4-6 mm hyperplastic rectal polyp, mid left-sided diverticulosis.   CYST REMOVAL NECK     HERNIA REPAIR     INSERTION OF IMPLANTABLE LEFT VENTRICULAR ASSIST DEVICE N/A 07/04/2022   Procedure: INSERTION OF IMPLANTABLE LEFT VENTRICULAR ASSIST DEVICE; ATRICLIP 45;  Surgeon: Murriel Toribio DEL, MD;  Location: [facility] OR;  Service: Open Heart Surgery;  Laterality: N/A;   RIGHT HEART CATH N/A 06/27/2022   Procedure: RIGHT HEART CATH;  Surgeon: Rolan Ezra RAMAN, MD;  Location: [facility] INVASIVE CV LAB;  Service: Cardiovascular;  Laterality: N/A;   RIGHT HEART CATH N/A 06/29/2022   Procedure: RIGHT HEART CATH;  Surgeon: Rolan Ezra RAMAN, MD;  Location[facility] INVASIVE CV LAB;  Service: Cardiovascular;  Laterality: N/A;   RIGHT HEART CATH N/A 10/29/2023   Procedure: RIGHT HEART CATH;  Surgeon: Rolan Ezra RAMAN, MD;  Location: [facility] INVASIVE CV LAB;   Service: Cardiovascular;  Laterality: N/A;   TEE WITHOUT CARDIOVERSION N/A 07/04/2022   Procedure: TRANSESOPHAGEAL ECHOCARDIOGRAM (TEE);  Surgeon: Murriel Toribio DEL, MD;  Location: [facility] OR;  Service: Open Heart Surgery;  Laterality: N/A;   TONSILLECTOMY      Social History   Socioeconomic History   Marital status: Divorced    Spouse name: Not on file   Number of children: Not on file   Years of education: Not on file   Highest education level: Not on file  Occupational History   Not on file  Tobacco Use   Smoking status: Former    Current packs/day: 0.00    Average packs/day: 1 pack/day for 20.0 years (20.0 ttl pk-yrs)    Types: Cigarettes    Start date: 07/07/1999    Quit date: 07/07/2019    Years since quitting: 4.7   Smokeless tobacco: Never  Vaping Use   Vaping status: Never Used  Substance and Sexual Activity   Alcohol use: Not Currently   Drug use: Not Currently    Types: Crack cocaine    Comment: last used  over 2 years ago (10/24/21).   Sexual activity: Not on file  Other Topics Concern   Not on file  Social History Narrative   Not on file   Social Drivers of Health   Financial Resource Strain: Low Risk  (06/29/2022)   Overall Financial Resource Strain (CARDIA)    Difficulty of Paying Living Expenses: Not hard at all  Food Insecurity: No Food Insecurity (01/08/2024)   Hunger Vital Sign    Worried About Running Out of Food in the Last Year: Never true    Ran Out of Food in the Last Year: Never true  Transportation Needs: No Transportation Needs (06/29/2022)   PRAPARE - Administrator, Civil Service (Medical): No    Lack of Transportation (Non-Medical): No  Physical Activity: Not on file  Stress: Not on file  Social Connections: Not on file    Family History  Problem Relation Age of Onset   Hyperlipidemia Mother    Heart disease Mother        has PPM   Diabetes Mother    Hypertension Mother    Heart disease Maternal Grandmother    Prostate  cancer Maternal Grandfather    Colon cancer Neg Hx    Colon polyps Neg Hx     Outpatient Encounter Medications as of 03/20/2024  Medication Sig   acetaminophen  (TYLENOL ) 325 MG tablet Take 1-2 tablets (325-650 mg total) by mouth every 4 (four) hours as needed for mild pain.   atorvastatin  (LIPITOR) 40 MG tablet Take 40 mg by mouth daily.   Calcium  Carb-Cholecalciferol  600-10 MG-MCG TABS Take 1 tablet by mouth daily with breakfast.   Continuous Glucose Receiver (FREESTYLE LIBRE 3 READER) DEVI 1 each by Does not apply route daily.   Continuous Glucose Sensor (FREESTYLE LIBRE 3 SENSOR) MISC PLACE 1 SENSOR ON THE SKIN EVERY 14 DAYS   digoxin  (LANOXIN ) 0.125 MG tablet Take 0.5 tablets (0.0625 mg total) by mouth daily.   empagliflozin  (JARDIANCE ) 25 MG TABS tablet Take 1 tablet (25 mg total) by mouth daily.   enoxaparin  (LOVENOX ) 40 MG/0.4ML injection Inject 0.4 mLs (40 mg total) into the skin every 12 (twelve) hours. (Patient not taking: Reported on 02/25/2024)   Fe Fum-Vit C-Vit B12-FA (TRIGELS-F FORTE) CAPS capsule Take 1 capsule by mouth daily after breakfast. (Patient not taking: Reported on 02/25/2024)   insulin  glargine (LANTUS  SOLOSTAR) 100 UNIT/ML Solostar Pen Inject 40 Units into the skin at bedtime.   insulin  lispro (HUMALOG ) 100 UNIT/ML KwikPen Use only when pre-meal glucose is above 150   Insulin  Pen Needle (PEN NEEDLES) 32G X 4 MM MISC Use to inject insulin  4 times daily as ordered.   levothyroxine  (SYNTHROID ) 25 MCG tablet Take 1 tablet (25 mcg total) by mouth daily before breakfast.   magnesium  oxide (MAG-OX) 400 (240 Mg) MG tablet TAKE 1 TABLET(400 MG) BY MOUTH TWICE DAILY   Multiple Vitamin (MULTIVITAMIN WITH MINERALS) TABS tablet Take 1 tablet by mouth daily.   pantoprazole  (PROTONIX ) 40 MG tablet Take 1 tablet (40 mg total) by mouth daily.   potassium chloride  SA (KLOR-CON  M) 20 MEQ tablet Take 2 tablets (40 mEq total) by mouth daily.   sacubitril -valsartan  (ENTRESTO ) 24-26 MG Take 1  tablet by mouth 2 (two) times daily.   sertraline  (ZOLOFT ) 50 MG tablet Take 1 tablet (50 mg total) by mouth daily.   sildenafil  (REVATIO ) 20 MG tablet TAKE 1 TABLET(20 MG) BY MOUTH THREE TIMES DAILY   SPIRIVA  HANDIHALER 18 MCG inhalation capsule PLACE  1 CAPSULE INTO INHALER AND INHALE EVERY DAY   spironolactone  (ALDACTONE ) 25 MG tablet Take 1 tablet (25 mg total) by mouth at bedtime.   torsemide  (DEMADEX ) 20 MG tablet Take 3 tablets (60 mg total) by mouth 2 (two) times daily.   warfarin (COUMADIN ) 3 MG tablet TAKE 2 TABLETS BY MOUTH EVERY TUESDAY, THURSDAYS AND SATURDAY AND 1 TABLET ALL OTHER DAYS   [DISCONTINUED] empagliflozin  (JARDIANCE ) 10 MG TABS tablet Take 1 tablet (10 mg total) by mouth daily.   [DISCONTINUED] insulin  glargine (LANTUS  SOLOSTAR) 100 UNIT/ML Solostar Pen Inject 40 Units into the skin at bedtime.   [DISCONTINUED] insulin  lispro (HUMALOG ) 100 UNIT/ML KwikPen ADMINISTER 5 TO 8 UNITS UNDER THE SKIN THREE TIMES DAILY WITH MEALS   No facility-administered encounter medications on file as of 03/20/2024.    ALLERGIES: No Known Allergies  VACCINATION STATUS: Immunization History  Administered Date(s) Administered   Influenza,inj,Quad PF,6+ Mos 07/24/2022   Moderna Sars-Covid-2 Vaccination 11/25/2019, 12/23/2019   PNEUMOCOCCAL CONJUGATE-20 07/24/2022    Diabetes He presents for his follow-up diabetic visit. He has type 2 diabetes mellitus. Onset time: He was diagnosed at approximate age of 57. His disease course has been improving. There are no hypoglycemic associated symptoms. Pertinent negatives for hypoglycemia include no confusion, headaches, pallor or seizures. Pertinent negatives for diabetes include no chest pain, no fatigue, no polyphagia and no weakness. There are no hypoglycemic complications. Symptoms are improving. Diabetic complications include heart disease, impotence and nephropathy. (His diabetes is complicated by CHF with ejection fraction of 20-25% status post  permanent cardiac pacemaker, coronary artery disease, comorbid hypertension/hyper lipidemia.) Risk factors for coronary artery disease include dyslipidemia, diabetes mellitus, male sex, hypertension, tobacco exposure and sedentary lifestyle. His weight is fluctuating minimally. He is following a generally unhealthy diet. When asked about meal planning, he reported none. He has not had a previous visit with a dietitian. He never participates in exercise. His home blood glucose trend is decreasing steadily. His breakfast blood glucose range is generally 140-180 mg/dl. His lunch blood glucose range is generally 140-180 mg/dl. His dinner blood glucose range is generally 140-180 mg/dl. His bedtime blood glucose range is generally 140-180 mg/dl. His overall blood glucose range is 140-180 mg/dl. (He has a freestyle libre 3 CGM.  He presents with the improving glycemic profile averaging 150 mg per DL for the most recent 2 weeks.  His AGP report shows 75% time in range, 22% 1 hyperglycemia, 2% level 2 hyperglycemia.  He has 1% level 1 hypoglycemia.  He is point-of-care A1c 7.7%.  His A1c has generally improved from 11.1%.) An ACE inhibitor/angiotensin II receptor blocker is not being taken.  Hyperlipidemia The current episode started more than 1 year ago. Exacerbating diseases include chronic renal disease and diabetes. Pertinent negatives include no chest pain, myalgias or shortness of breath. Risk factors for coronary artery disease include diabetes mellitus, dyslipidemia and male sex.  Hypertension This is a chronic problem. The problem is controlled. Pertinent negatives include no chest pain, headaches, neck pain, palpitations or shortness of breath. Risk factors for coronary artery disease include dyslipidemia, diabetes mellitus, male gender, sedentary lifestyle and smoking/tobacco exposure. Past treatments include diuretics. Hypertensive end-organ damage includes kidney disease, CAD/MI and heart failure.  Identifiable causes of hypertension include chronic renal disease.     Objective:       03/20/2024   10:27 AM 02/25/2024   12:25 PM 02/25/2024   12:24 PM  Vitals with BMI  Height 5' 10    Weight 204  lbs 13 oz  207 lbs 10 oz  BMI 29.39    Systolic  100 107  Diastolic  0 87  Pulse   77    Ht 5' 10 (1.778 m)   Wt 204 lb 12.8 oz (92.9 kg)   BMI 29.39 kg/m   Wt Readings from Last 3 Encounters:  03/20/24 204 lb 12.8 oz (92.9 kg)  02/25/24 207 lb 9.6 oz (94.2 kg)  01/08/24 205 lb 4 oz (93.1 kg)    Patient with CHF with ejection fraction 20 to 25% with permanent pacemaker    CMP ( most recent) CMP     Component Value Date/Time   NA 138 03/06/2024 1104   NA 135 11/23/2021 0814   K 4.0 03/06/2024 1104   CL 102 03/06/2024 1104   CO2 26 03/06/2024 1104   GLUCOSE 119 (H) 03/06/2024 1104   BUN 24 (H) 03/06/2024 1104   BUN 20 11/23/2021 0814   CREATININE 1.24 03/06/2024 1104   CALCIUM  9.2 03/06/2024 1104   PROT 6.8 09/25/2023 1843   ALBUMIN  3.5 09/25/2023 1843   AST 29 09/25/2023 1843   ALT 31 09/25/2023 1843   ALKPHOS 136 (H) 09/25/2023 1843   BILITOT 0.2 09/25/2023 1843   EGFR 58 (L) 11/23/2021 0814   GFRNONAA >60 03/06/2024 1104     Diabetic Labs (most recent): Lab Results  Component Value Date   HGBA1C 7.7 (A) 03/20/2024   HGBA1C 8.0 (A) 07/18/2023   HGBA1C 11.1 (A) 04/05/2023     Lipid Panel ( most recent) Lipid Panel     Component Value Date/Time   CHOL 212 (H) 06/27/2022 1825   TRIG 134 06/27/2022 1825   HDL 55 06/27/2022 1825   CHOLHDL 3.9 06/27/2022 1825   VLDL 27 06/27/2022 1825   LDLCALC 130 (H) 06/27/2022 1825      Lab Results  Component Value Date   TSH 8.560 (H) 10/10/2022   TSH 4.362 08/17/2022   TSH 2.576 06/27/2022   TSH 2.723 10/15/2021   FREET4 1.28 (H) 08/17/2022   FREET4 1.06 06/27/2022      Assessment & Plan:   1. Type 2 diabetes mellitus with other specified complication, with Reth-term current use of insulin  (HCC) -  Darryl Frye has currently uncontrolled symptomatic type 2 DM since  63 years of age.  He has a freestyle libre 3 CGM.  He presents with the improving glycemic profile averaging 150 mg per DL for the most recent 2 weeks.  His AGP report shows 75% time in range, 22% 1 hyperglycemia, 2% level 2 hyperglycemia.  He has 1% level 1 hypoglycemia.  He is point-of-care A1c 7.7%.  His A1c has generally improved from 11.1%.  Recent labs reviewed. - I had a Dayrit discussion with him about the possible risk factors and  the pathology behind its diabetes and its complications. -his diabetes is complicated by  by CHF with ejection fraction of 20-25% status post permanent cardiac pacemaker, coronary artery disease, comorbid hypertension/hyper lipidemia and he remains at exceedingly high risk for more acute and chronic complications which include CAD, CVA, CKD, retinopathy, and neuropathy. These are all discussed in detail with him. -  he is advised to stick to a routine mealtimes to eat 3 complete meals a day and snack only when necessary ( to snack only to correct hypoglycemia BG <70 day time or <100 at night).   - he acknowledges that there is a room for improvement in his food and drink choices. -  Suggestion is made for him to avoid simple carbohydrates  from his diet including Cakes, Sweet Desserts, Ice Cream, Soda (diet and regular), Sweet Tea, Candies, Chips, Cookies, Store Bought Juices, Alcohol , Artificial Sweeteners,  Coffee Creamer, and Sugar-free Products, Lemonade. This will help patient to have more stable blood glucose profile and potentially avoid unintended weight gain.  The following Lifestyle Medicine recommendations according to American College of Lifestyle Medicine  [facility]) were discussed and and offered to patient and he  agrees to start the journey:  A. Whole Foods, Plant-Based Nutrition comprising of fruits and vegetables, plant-based proteins, whole-grain carbohydrates was discussed in  detail with the patient.   A list for source of those nutrients were also provided to the patient.  Patient will use only water or unsweetened tea for hydration. B.  The need to stay away from risky substances including alcohol, smoking; obtaining 7 to 9 hours of restorative sleep, at least 150 minutes of moderate intensity exercise weekly, the importance of healthy social connections,  and stress management techniques were discussed. C.  A full color page of  Calorie density of various food groups per pound showing examples of each food groups was provided to the patient.   - he has been scheduled with Penny Crumpton, RDN, CDE for individualized diabetes education.  - I have approached him with the following individualized plan to manage  his diabetes and patient agrees:   -Patient presents with continued improvement of his glycemia towards target, however we will continue to benefit from intensive treatment with multimodality treatment options.    - In light of his presentation with tightening glycemic profile, he is allowed to lower his Humalog  to use only for correction above 150 mg/dl. - He is advised to continue Lantus  40 units nightly.   - I discussed and increase his Jardiance  to 25 mg p.o. daily at breakfast.   - he is encouraged to call clinic for blood glucose levels less than 70 or above 180 mg /dl weekly average. -He does have CHF and CKD, stands to benefit from SGLT2 inhibitors.   - he will be considered for incretin therapy as appropriate next visit.  - Specific targets for  A1c;  LDL, HDL,  and Triglycerides were discussed with the patient.  2) Blood Pressure /Hypertension:   -His blood pressure is controlled to target.  he is advised to continue his current medications including spironolactone  25 mg p.o. daily at breakfast.  He is also on Lasix  40 mg p.o. daily in the morning and 60 mg p.o. daily in the evening.  3) Lipids/Hyperlipidemia:   Review of his recent lipid panel  showed uncontrolled  LDL at 130 .  He also has elevated LP(a) at 182.4.  He is at exceedingly high risk for cardiovascular disease.  He is advised to continue Lipitor 40 mg p.o. nightly, which was increased from the lower dose during his last visit.  Side effects and precautions discussed with him.  4)  Weight/Diet:  Body mass index is 29.39 kg/m.  -   clearly complicating his diabetes care.   he is  a candidate for weight loss. I discussed with him the fact that loss of 5 - 10% of his  current body weight will have the most impact on his diabetes management.  The above detailed  ACLM recommendations for nutrition, exercise, sleep, social life, avoidance of risky substances, the need for restorative sleep   information will also detailed on discharge instructions.  5) hypothyroidism:  He is advised to continue levothyroxine  25 mcg p.o. daily before breakfast.     - We discussed about the correct intake of his thyroid  hormone, on empty stomach at fasting, with water, separated by at least 30 minutes from breakfast and other medications,  and separated by more than 4 hours from calcium , iron, multivitamins, acid reflux medications (PPIs). -Patient is made aware of the fact that thyroid  hormone replacement is needed for life, dose to be adjusted by periodic monitoring of thyroid  function tests.   6) Chronic Care/Health Maintenance:  -he  is on Statin medications and  is encouraged to initiate and continue to follow up with Ophthalmology, Dentist,  Podiatrist at least yearly or according to recommendations, and advised to   stay away from smoking. I have recommended yearly flu vaccine and pneumonia vaccine at least every 5 years; moderate intensity exercise for up to 150 minutes weekly; and  sleep for 7- 9 hours a day.  - he is  advised to maintain close follow up with McCorkle, Tenika, FNP for primary care needs, as well as his other providers for optimal and coordinated care.   I spent  42   minutes in the care of the patient today including review of labs from CMP, Lipids, Thyroid  Function, Hematology (current and previous including abstractions from other facilities); face-to-face time discussing  his blood glucose readings/logs, discussing hypoglycemia and hyperglycemia episodes and symptoms, medications doses, his options of short and Ticer term treatment based on the latest standards of care / guidelines;  discussion about incorporating lifestyle medicine;  and documenting the encounter. Risk reduction counseling performed per USPSTF guidelines to reduce cardiovascular risk factors.     Please refer to Patient Instructions for Blood Glucose Monitoring and Insulin /Medications Dosing Guide  in media tab for additional information. Please  also refer to  Patient Self Inventory in the Media  tab for reviewed elements of pertinent patient history.  Darryl Frye participated in the discussions, expressed understanding, and voiced agreement with the above plans.  All questions were answered to his satisfaction. he is encouraged to contact clinic should he have any questions or concerns prior to his return visit.   Follow up plan: - Return in about 4 months (around 07/20/2024) for F/U with Pre-visit Labs, Meter/CGM/Logs, A1c here.  Darryl Earl, MD [facility] [address] [place], [place] [address] Phone: [phone]  Fax: [phone]    03/20/2024, 12:38 PM  This note was partially dictated with voice recognition software. Similar sounding words can be transcribed inadequately or may not  be corrected upon review.

## 2024-03-24 ENCOUNTER — Ambulatory Visit (HOSPITAL_COMMUNITY): Payer: Self-pay | Admitting: Pharmacist

## 2024-03-24 LAB — POCT INR: INR: 2.4 (ref 2.0–3.0)

## 2024-03-31 ENCOUNTER — Ambulatory Visit (HOSPITAL_COMMUNITY): Payer: Self-pay | Admitting: Pharmacist

## 2024-03-31 LAB — POCT INR: INR: 1.4 — AB (ref 2.0–3.0)

## 2024-04-03 ENCOUNTER — Inpatient Hospital Stay: Attending: Oncology

## 2024-04-03 DIAGNOSIS — D649 Anemia, unspecified: Secondary | ICD-10-CM | POA: Diagnosis not present

## 2024-04-03 DIAGNOSIS — E611 Iron deficiency: Secondary | ICD-10-CM | POA: Insufficient documentation

## 2024-04-03 DIAGNOSIS — D751 Secondary polycythemia: Secondary | ICD-10-CM | POA: Insufficient documentation

## 2024-04-03 DIAGNOSIS — I509 Heart failure, unspecified: Secondary | ICD-10-CM | POA: Diagnosis not present

## 2024-04-03 LAB — IRON AND TIBC
Iron: 39 ug/dL — ABNORMAL LOW (ref 45–182)
Saturation Ratios: 11 % — ABNORMAL LOW (ref 17.9–39.5)
TIBC: 359 ug/dL (ref 250–450)
UIBC: 320 ug/dL

## 2024-04-03 LAB — CBC WITH DIFFERENTIAL/PLATELET
Abs Immature Granulocytes: 0.01 K/uL (ref 0.00–0.07)
Basophils Absolute: 0.1 K/uL (ref 0.0–0.1)
Basophils Relative: 1 %
Eosinophils Absolute: 0.2 K/uL (ref 0.0–0.5)
Eosinophils Relative: 3 %
HCT: 50.9 % (ref 39.0–52.0)
Hemoglobin: 16.4 g/dL (ref 13.0–17.0)
Immature Granulocytes: 0 %
Lymphocytes Relative: 18 %
Lymphs Abs: 1.1 K/uL (ref 0.7–4.0)
MCH: 26.8 pg (ref 26.0–34.0)
MCHC: 32.2 g/dL (ref 30.0–36.0)
MCV: 83.3 fL (ref 80.0–100.0)
Monocytes Absolute: 0.6 K/uL (ref 0.1–1.0)
Monocytes Relative: 10 %
Neutro Abs: 4.3 K/uL (ref 1.7–7.7)
Neutrophils Relative %: 68 %
Platelets: 307 K/uL (ref 150–400)
RBC: 6.11 MIL/uL — ABNORMAL HIGH (ref 4.22–5.81)
RDW: 18.6 % — ABNORMAL HIGH (ref 11.5–15.5)
WBC: 6.3 K/uL (ref 4.0–10.5)
nRBC: 0 % (ref 0.0–0.2)

## 2024-04-03 LAB — COMPREHENSIVE METABOLIC PANEL WITH GFR
ALT: 24 U/L (ref 0–44)
AST: 23 U/L (ref 15–41)
Albumin: 3.9 g/dL (ref 3.5–5.0)
Alkaline Phosphatase: 149 U/L — ABNORMAL HIGH (ref 38–126)
Anion gap: 9 (ref 5–15)
BUN: 25 mg/dL — ABNORMAL HIGH (ref 6–20)
CO2: 25 mmol/L (ref 22–32)
Calcium: 8.7 mg/dL — ABNORMAL LOW (ref 8.9–10.3)
Chloride: 102 mmol/L (ref 98–111)
Creatinine, Ser: 1.25 mg/dL — ABNORMAL HIGH (ref 0.61–1.24)
GFR, Estimated: >60 mL/min (ref >60)
Glucose, Bld: 127 mg/dL — ABNORMAL HIGH (ref 70–99)
Potassium: 4.3 mmol/L (ref 3.5–5.1)
Sodium: 136 mmol/L (ref 135–145)
Total Bilirubin: 0.7 mg/dL (ref 0.0–1.2)
Total Protein: 7.8 g/dL (ref 6.5–8.1)

## 2024-04-03 LAB — FERRITIN: Ferritin: 21 ng/mL — ABNORMAL LOW (ref 24–336)

## 2024-04-05 LAB — ERYTHROPOIETIN: Erythropoietin: 21.2 m[IU]/mL — ABNORMAL HIGH (ref 2.6–18.5)

## 2024-04-06 ENCOUNTER — Other Ambulatory Visit: Payer: Self-pay | Admitting: "Endocrinology

## 2024-04-07 ENCOUNTER — Ambulatory Visit (HOSPITAL_COMMUNITY): Payer: Self-pay | Admitting: Pharmacist

## 2024-04-07 LAB — POCT INR: INR: 2 (ref 2.0–3.0)

## 2024-04-10 ENCOUNTER — Inpatient Hospital Stay: Admitting: Oncology

## 2024-04-10 VITALS — BP 108/58 | HR 69 | Temp 97.7°F | Resp 18 | Ht 71.0 in | Wt 204.8 lb

## 2024-04-10 DIAGNOSIS — D508 Other iron deficiency anemias: Secondary | ICD-10-CM

## 2024-04-10 DIAGNOSIS — E611 Iron deficiency: Secondary | ICD-10-CM | POA: Diagnosis not present

## 2024-04-10 DIAGNOSIS — D649 Anemia, unspecified: Secondary | ICD-10-CM | POA: Diagnosis not present

## 2024-04-10 DIAGNOSIS — D751 Secondary polycythemia: Secondary | ICD-10-CM | POA: Diagnosis not present

## 2024-04-10 NOTE — Assessment & Plan Note (Addendum)
 Patient has elevated red blood cell mass without elevation in hemoglobin or hematocrit Likely secondary to severe heart failure with compensated erythrocytosis EPO: 21.2-elevated-continue with secondary causes  - Will obtain ultrasound of abdomen to rule out hepatocellular renal causes of elevated erythropoietin . - Continue to monitor for now - Continue to follow with cardiology  Return to clinic in 3 months with labs and ultrasound

## 2024-04-10 NOTE — Assessment & Plan Note (Signed)
 The most likely cause of his anemia is due to chronic blood loss or malabsorption syndrome. Lab Results  Component Value Date   IRON 39 (L) 04/03/2024   TIBC 359 04/03/2024   FERRITIN 21 (L) 04/03/2024  TSAT: 11  Last colonoscopy/endoscopy: 2016 with colon polyps   -We discussed some of the risks, benefits, and alternatives of intravenous iron infusions. The patient is symptomatic from anemia and the iron level is critically low. He tolerated oral iron supplement poorly and desires to achieve higher levels of iron faster for adequate hematopoesis. Some of the side-effects to be expected including risks of infusion reactions, phlebitis, headaches, nausea and fatigue.  The patient is willing to proceed. Patient education material was dispensed. Goal is to keep ferritin level greater than 50 and resolution of anemia -Start oral iron every other day.  Use MiraLAX  for constipation - Will refer to GI for further workup for iron deficiency  Return to clinic in 12 weeks with labs to assess response to IV iron

## 2024-04-10 NOTE — Patient Instructions (Signed)
 [facility] at [facility] Discharge Instructions   You were seen and examined today by Dr. Davonna.  She reviewed the results of your lab work which are mostly normal/stable. Your iron is low. We will arrange for you to have iron infusions x 2 in the clinic to correct this.   We will also arrange for you to have an ultrasound of your abdomen.  We will see you back in 3 months.   Return as scheduled.    Thank you for choosing [facility] at [facility] to provide your oncology and hematology care.  To afford each patient quality time with our provider, please arrive at least 15 minutes before your scheduled appointment time.   If you have a lab appointment with the Cancer Center please come in thru the Main Entrance and check in at the main information desk.  You need to re-schedule your appointment should you arrive 10 or more minutes late.  We strive to give you quality time with our providers, and arriving late affects you and other patients whose appointments are after yours.  Also, if you no show three or more times for appointments you may be dismissed from the clinic at the providers discretion.     Again, thank you for choosing [facility].  Our hope is that these requests will decrease the amount of time that you wait before being seen by our physicians.       _____________________________________________________________  Should you have questions after your visit to [facility], please contact our office at [phone] and follow the prompts.  Our office hours are 8:00 a.m. and 4:30 p.m. Monday - Friday.  Please note that voicemails left after 4:00 p.m. may not be returned until the following business day.  We are closed weekends and major holidays.  You do have access to a nurse 24-7, just call the main number to the clinic [phone] and do not press any options, hold on the line and a nurse will answer the  phone.    For prescription refill requests, have your pharmacy contact our office and allow 72 hours.    Due to Covid, you will need to wear a mask upon entering the hospital. If you do not have a mask, a mask will be given to you at the Main Entrance upon arrival. For doctor visits, patients may have 1 support person age 9 or older with them. For treatment visits, patients can not have anyone with them due to social distancing guidelines and our immunocompromised population.

## 2024-04-10 NOTE — Progress Notes (Signed)
 [facility] at [facility]  HEMATOLOGY FOLLOW-UP VISIT  Darryl Lurie, FNP  REASON FOR FOLLOW-UP: Polycythemia  ASSESSMENT & PLAN:  Patient is a 63 y.o. male following for ploycythemia  Assessment & Plan Erythrocytosis Patient has elevated red blood cell mass without elevation in hemoglobin or hematocrit Likely secondary to severe heart failure with compensated erythrocytosis EPO: 21.2-elevated-continue with secondary causes  - Will obtain ultrasound of abdomen to rule out hepatocellular renal causes of elevated erythropoietin . - Continue to monitor for now - Continue to follow with cardiology  Return to clinic in 3 months with labs and ultrasound  Iron deficiency The most likely cause of his anemia is due to chronic blood loss or malabsorption syndrome. Lab Results  Component Value Date   IRON 39 (L) 04/03/2024   TIBC 359 04/03/2024   FERRITIN 21 (L) 04/03/2024  TSAT: 11  Last colonoscopy/endoscopy: 2016 with colon polyps   -We discussed some of the risks, benefits, and alternatives of intravenous iron infusions. The patient is symptomatic from anemia and the iron level is critically low. He tolerated oral iron supplement poorly and desires to achieve higher levels of iron faster for adequate hematopoesis. Some of the side-effects to be expected including risks of infusion reactions, phlebitis, headaches, nausea and fatigue.  The patient is willing to proceed. Patient education material was dispensed. Goal is to keep ferritin level greater than 50 and resolution of anemia -Start oral iron every other day.  Use MiraLAX  for constipation - Will refer to GI for further workup for iron deficiency  Return to clinic in 12 weeks with labs to assess response to IV iron     Orders Placed This Encounter  Procedures   US  Abdomen Complete    Standing Status:   Future    Expiration Date:   04/10/2025    Reason for Exam (SYMPTOM  OR DIAGNOSIS REQUIRED):    polycythemia    Preferred imaging location?:   [facility]   CBC with Differential    Standing Status:   Future    Expected Date:   07/07/2024    Expiration Date:   10/05/2024   Comprehensive metabolic panel    Standing Status:   Future    Expected Date:   07/07/2024    Expiration Date:   10/05/2024   Iron and TIBC ([facility] ONLY)    Standing Status:   Future    Expected Date:   07/07/2024    Expiration Date:   10/05/2024   Ferritin    Standing Status:   Future    Expected Date:   07/07/2024    Expiration Date:   10/05/2024   Vitamin B12    Standing Status:   Future    Expected Date:   07/07/2024    Expiration Date:   10/05/2024   Folate    Standing Status:   Future    Expected Date:   07/07/2024    Expiration Date:   10/05/2024    The total time spent in the appointment was 20 minutes encounter with patients including review of chart and various tests results, discussions about plan of care and coordination of care plan   All questions were answered. The patient knows to call the clinic with any problems, questions or concerns. No barriers to learning was detected.   LILLETTE Verneta SAUNDERS Teague,acting as a Neurosurgeon for Mickiel Dry, MD.,have documented all relevant documentation on the behalf of Mickiel Dry, MD,as directed by  Mickiel Dry, MD  while in the presence of Mickiel Dry, MD.  I, Mickiel Dry MD, have reviewed the above documentation for accuracy and completeness, and I agree with the above.    Mickiel Dry, MD 8/21/20253:37 PM   SUMMARY OF HEMATOLOGIC HISTORY: Erythrocytosis with no polycythemia, likely secondary - EPO: 21.2-elevated   INTERVAL HISTORY: Darryl Frye 63 y.o. male following for polycythemia.  Patient reports feeling tired and brain fogginess occasionally.  He has no other complaints today.  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  He denies abdominal  pain, loss of appetite or weight loss.   I have reviewed the past medical history, past surgical history, social history and family history with the patient   ALLERGIES:  has no known allergies.  MEDICATIONS:  Current Outpatient Medications  Medication Sig Dispense Refill   acetaminophen  (TYLENOL ) 325 MG tablet Take 1-2 tablets (325-650 mg total) by mouth every 4 (four) hours as needed for mild pain.     atorvastatin  (LIPITOR) 40 MG tablet Take 40 mg by mouth daily.     Calcium  Carb-Cholecalciferol  600-10 MG-MCG TABS Take 1 tablet by mouth daily with breakfast. 90 tablet 3   Continuous Glucose Receiver (FREESTYLE LIBRE 3 READER) DEVI 1 each by Does not apply route daily. 2 each 3   Continuous Glucose Sensor (FREESTYLE LIBRE 3 SENSOR) MISC PLACE 1 SENSOR ON THE SKIN EVERY 14 DAYS 2 each 3   digoxin  (LANOXIN ) 0.125 MG tablet Take 0.5 tablets (0.0625 mg total) by mouth daily. 30 tablet 5   empagliflozin  (JARDIANCE ) 25 MG TABS tablet Take 1 tablet (25 mg total) by mouth daily. 90 tablet 1   enoxaparin  (LOVENOX ) 40 MG/0.4ML injection Inject 0.4 mLs (40 mg total) into the skin every 12 (twelve) hours. 4 mL 0   Fe Fum-Vit C-Vit B12-FA (TRIGELS-F FORTE) CAPS capsule Take 1 capsule by mouth daily after breakfast. 30 capsule 6   insulin  glargine (LANTUS  SOLOSTAR) 100 UNIT/ML Solostar Pen Inject 40 Units into the skin at bedtime. 45 mL 1   insulin  lispro (HUMALOG ) 100 UNIT/ML KwikPen Use only when pre-meal glucose is above 150 30 mL 0   Insulin  Pen Needle (DROPLET PEN NEEDLES) 32G X 4 MM MISC USE TO INJECT 4 TIMES A DAY 100 each 1   levothyroxine  (SYNTHROID ) 25 MCG tablet Take 1 tablet (25 mcg total) by mouth daily before breakfast. 90 tablet 3   magnesium  oxide (MAG-OX) 400 (240 Mg) MG tablet TAKE 1 TABLET(400 MG) BY MOUTH TWICE DAILY 180 tablet 3   Multiple Vitamin (MULTIVITAMIN WITH MINERALS) TABS tablet Take 1 tablet by mouth daily.     pantoprazole  (PROTONIX ) 40 MG tablet Take 1 tablet (40 mg  total) by mouth daily. 90 tablet 3   potassium chloride  SA (KLOR-CON  M) 20 MEQ tablet Take 2 tablets (40 mEq total) by mouth daily. 180 tablet 3   sacubitril -valsartan  (ENTRESTO ) 24-26 MG Take 1 tablet by mouth 2 (two) times daily. 60 tablet 3   sertraline  (ZOLOFT ) 50 MG tablet Take 1 tablet (50 mg total) by mouth daily. 90 tablet 3   sildenafil  (REVATIO ) 20 MG tablet TAKE 1 TABLET(20 MG) BY MOUTH THREE TIMES DAILY 90 tablet 6   SPIRIVA  HANDIHALER 18 MCG inhalation capsule PLACE 1 CAPSULE INTO INHALER AND INHALE EVERY DAY 30 capsule 12   spironolactone  (ALDACTONE ) 25 MG tablet Take 1 tablet (25 mg total) by mouth at bedtime. 90 tablet 3   torsemide  (DEMADEX ) 20 MG  tablet Take 3 tablets (60 mg total) by mouth 2 (two) times daily. 180 tablet 10   warfarin (COUMADIN ) 3 MG tablet TAKE 2 TABLETS BY MOUTH EVERY TUESDAY, THURSDAYS AND SATURDAY AND 1 TABLET ALL OTHER DAYS 120 tablet 6   No current facility-administered medications for this visit.     REVIEW OF SYSTEMS:   Constitutional: Denies fevers, chills or night sweats Eyes: Denies blurriness of vision Ears, nose, mouth, throat, and face: Denies mucositis or sore throat Respiratory: Denies cough, dyspnea or wheezes Cardiovascular: Denies palpitation, chest discomfort or lower extremity swelling Gastrointestinal:  Denies nausea, heartburn or change in bowel habits Skin: Denies abnormal skin rashes Lymphatics: Denies new lymphadenopathy or easy bruising Neurological:Denies numbness, tingling or new weaknesses Behavioral/Psych: Mood is stable, no new changes  All other systems were reviewed with the patient and are negative.  PHYSICAL EXAMINATION:   Vitals:   04/10/24 1456  BP: (!) 108/58  Pulse: 69  Resp: 18  Temp: 97.7 F (36.5 C)  SpO2: 97%    GENERAL:alert, no distress and comfortable SKIN: skin color, texture, turgor are normal, no rashes or significant lesions LYMPH:  no palpable lymphadenopathy in the cervical, axillary or  inguinal LUNGS: clear to auscultation and percussion with normal breathing effort HEART: regular rate & rhythm and no murmurs and no lower extremity edema ABDOMEN:abdomen soft, non-tender and normal bowel sounds Musculoskeletal:no cyanosis of digits and no clubbing  NEURO: alert & oriented x 3 with fluent speech  LABORATORY DATA:  I have reviewed the data as listed  Lab Results  Component Value Date   WBC 6.3 04/03/2024   NEUTROABS 4.3 04/03/2024   HGB 16.4 04/03/2024   HCT 50.9 04/03/2024   MCV 83.3 04/03/2024   PLT 307 04/03/2024     Chemistry      Component Value Date/Time   NA 136 04/03/2024 1324   NA 135 11/23/2021 0814   K 4.3 04/03/2024 1324   CL 102 04/03/2024 1324   CO2 25 04/03/2024 1324   BUN 25 (H) 04/03/2024 1324   BUN 20 11/23/2021 0814   CREATININE 1.25 (H) 04/03/2024 1324      Component Value Date/Time   CALCIUM  8.7 (L) 04/03/2024 1324   ALKPHOS 149 (H) 04/03/2024 1324   AST 23 04/03/2024 1324   ALT 24 04/03/2024 1324   BILITOT 0.7 04/03/2024 1324        Latest Reference Range & Units 04/03/24 13:24  Iron 45 - 182 ug/dL 39 (L)  UIBC ug/dL 679  TIBC 749 - 549 ug/dL 640  Saturation Ratios 17.9 - 39.5 % 11 (L)  Ferritin 24 - 336 ng/mL 21 (L)  (L): Data is abnormally low   Latest Reference Range & Units 04/03/24 13:24  Erythropoietin  2.6 - 18.5 mIU/mL 21.2 (H)  (H): Data is abnormally high  RADIOGRAPHIC STUDIES: I have personally reviewed the radiological images as listed and agreed with the findings in the report.  None new to review

## 2024-04-14 ENCOUNTER — Ambulatory Visit (HOSPITAL_COMMUNITY): Payer: Self-pay | Admitting: Pharmacist

## 2024-04-14 LAB — POCT INR: INR: 2.4 (ref 2.0–3.0)

## 2024-04-15 ENCOUNTER — Inpatient Hospital Stay

## 2024-04-15 ENCOUNTER — Encounter (INDEPENDENT_AMBULATORY_CARE_PROVIDER_SITE_OTHER): Payer: Self-pay | Admitting: *Deleted

## 2024-04-15 VITALS — BP 88/70 | HR 74 | Temp 97.7°F | Resp 20

## 2024-04-15 DIAGNOSIS — D751 Secondary polycythemia: Secondary | ICD-10-CM | POA: Diagnosis not present

## 2024-04-15 DIAGNOSIS — E611 Iron deficiency: Secondary | ICD-10-CM

## 2024-04-15 MED ORDER — SODIUM CHLORIDE 0.9 % IV SOLN: INTRAVENOUS | Status: DC

## 2024-04-15 MED ORDER — CETIRIZINE HCL 10 MG PO TABS
10.0000 mg | ORAL_TABLET | Freq: Once | ORAL | Status: AC
  Administered 2024-04-15: 10 mg via ORAL
  Filled 2024-04-15: qty 1

## 2024-04-15 MED ORDER — SODIUM CHLORIDE 0.9 % IV SOLN
510.0000 mg | Freq: Once | INTRAVENOUS | Status: AC
  Administered 2024-04-15: 510 mg via INTRAVENOUS
  Filled 2024-04-15: qty 510

## 2024-04-15 MED ORDER — ACETAMINOPHEN 325 MG PO TABS
650.0000 mg | ORAL_TABLET | Freq: Once | ORAL | Status: AC
  Administered 2024-04-15: 650 mg via ORAL
  Filled 2024-04-15: qty 2

## 2024-04-15 NOTE — Progress Notes (Signed)
 Patient presents to the cancer center to receive Feraheme infusion. Patient's BP 89/54. Pt asymptomatic. Per pt his blood pressure does this sometimes since he has had the LVAD placed. MD made aware, no new orders at this time and to proceed with Feraheme infusion today. Pt updated and agrees to plan.   Iron started at 0929 at 0935 pt expressed to RN that he felt dizzy. Iron stopped and vital signs checked- BP 97/71. At 0940 pt said he was no longer dizzy and felt fine. Iron restarted. Patient tolerated the rest of the iron infusion with no complaints voiced.  Peripheral IV site clean and dry with good blood return noted before and after infusion.  Pt observed for 45 minutes after iron infusion was completed with no complications. BP at discharge was 88/70 pt states that he feels fine and is asymptomatic. MD made aware. Per MD pt may be discharged. RN educated pt on the importance of notifying the clinic if any complications occur and when to seek emergency care, pt verbalized understanding and all questions answered at this time. VSS with discharge and left in satisfactory condition with no s/s of distress noted. All follow ups as scheduled.   Raelea Gosse

## 2024-04-15 NOTE — Progress Notes (Signed)
 Remote ICD transmission.

## 2024-04-15 NOTE — Patient Instructions (Signed)

## 2024-04-22 ENCOUNTER — Ambulatory Visit (HOSPITAL_COMMUNITY): Payer: Self-pay | Admitting: Pharmacist

## 2024-04-22 LAB — POCT INR: INR: 1.7 — AB (ref 2.0–3.0)

## 2024-04-23 ENCOUNTER — Other Ambulatory Visit (HOSPITAL_COMMUNITY): Payer: Self-pay | Admitting: Cardiology

## 2024-04-23 DIAGNOSIS — Z95811 Presence of heart assist device: Secondary | ICD-10-CM

## 2024-04-23 DIAGNOSIS — I509 Heart failure, unspecified: Secondary | ICD-10-CM

## 2024-04-24 ENCOUNTER — Inpatient Hospital Stay: Attending: Oncology

## 2024-04-24 VITALS — BP 92/75 | HR 64 | Temp 97.0°F | Resp 18

## 2024-04-24 DIAGNOSIS — Z862 Personal history of diseases of the blood and blood-forming organs and certain disorders involving the immune mechanism: Secondary | ICD-10-CM | POA: Insufficient documentation

## 2024-04-24 DIAGNOSIS — D751 Secondary polycythemia: Secondary | ICD-10-CM | POA: Insufficient documentation

## 2024-04-24 DIAGNOSIS — E611 Iron deficiency: Secondary | ICD-10-CM

## 2024-04-24 MED ORDER — ACETAMINOPHEN 325 MG PO TABS
650.0000 mg | ORAL_TABLET | Freq: Once | ORAL | Status: AC
  Administered 2024-04-24: 650 mg via ORAL
  Filled 2024-04-24: qty 2

## 2024-04-24 MED ORDER — SODIUM CHLORIDE 0.9 % IV SOLN: INTRAVENOUS | Status: DC

## 2024-04-24 MED ORDER — SODIUM CHLORIDE 0.9 % IV SOLN
510.0000 mg | Freq: Once | INTRAVENOUS | Status: AC
  Administered 2024-04-24: 510 mg via INTRAVENOUS
  Filled 2024-04-24: qty 510

## 2024-04-24 MED ORDER — CETIRIZINE HCL 10 MG PO TABS
10.0000 mg | ORAL_TABLET | Freq: Once | ORAL | Status: AC
  Administered 2024-04-24: 10 mg via ORAL
  Filled 2024-04-24: qty 1

## 2024-04-24 NOTE — Progress Notes (Signed)
 Patient tolerated iron infusion with no complaints voiced.  Peripheral IV site clean and dry with good blood return noted before and after infusion.  Band aid applied.  VSS with discharge and left in satisfactory condition with no s/s of distress noted.

## 2024-04-24 NOTE — Patient Instructions (Signed)

## 2024-04-25 ENCOUNTER — Telehealth (HOSPITAL_COMMUNITY): Payer: Self-pay | Admitting: Unknown Physician Specialty

## 2024-04-25 NOTE — Telephone Encounter (Signed)
 Received call from pt stating that he picked a scab off his head and it is bleeding and he can't get it to stop. Pt tells me that he has not applied any pressure. He states that he has put a couple different bandaids over it. Pt was instructed to apply hard pressure using a gauze or paper towel for at least 30 minutes or until the sore stops bleeding. Pt verbalized understanding.  Lauraine Ip RN, BSN VAD Coordinator 24/7 Pager [phone]

## 2024-04-28 ENCOUNTER — Other Ambulatory Visit (HOSPITAL_COMMUNITY): Payer: Self-pay

## 2024-04-28 ENCOUNTER — Ambulatory Visit (HOSPITAL_COMMUNITY): Payer: Self-pay | Admitting: Pharmacist

## 2024-04-28 DIAGNOSIS — Z7901 Long term (current) use of anticoagulants: Secondary | ICD-10-CM

## 2024-04-28 DIAGNOSIS — Z95811 Presence of heart assist device: Secondary | ICD-10-CM

## 2024-04-28 LAB — POCT INR: INR: 1.8 — AB (ref 2.0–3.0)

## 2024-04-29 ENCOUNTER — Other Ambulatory Visit (HOSPITAL_COMMUNITY): Payer: Self-pay | Admitting: *Deleted

## 2024-04-29 ENCOUNTER — Encounter (HOSPITAL_COMMUNITY): Payer: Self-pay | Admitting: Cardiology

## 2024-04-29 ENCOUNTER — Ambulatory Visit (HOSPITAL_COMMUNITY)
Admission: RE | Admit: 2024-04-29 | Discharge: 2024-04-29 | Disposition: A | Discharge status: Home / Self Care | Source: Ambulatory Visit | Attending: Cardiology | Admitting: Cardiology

## 2024-04-29 ENCOUNTER — Ambulatory Visit (HOSPITAL_COMMUNITY): Payer: Self-pay | Admitting: Pharmacist

## 2024-04-29 ENCOUNTER — Ambulatory Visit (HOSPITAL_COMMUNITY): Payer: Self-pay | Admitting: Cardiology

## 2024-04-29 VITALS — BP 115/78 | HR 82 | Wt 204.4 lb

## 2024-04-29 DIAGNOSIS — Z7682 Awaiting organ transplant status: Secondary | ICD-10-CM | POA: Insufficient documentation

## 2024-04-29 DIAGNOSIS — D631 Anemia in chronic kidney disease: Secondary | ICD-10-CM | POA: Diagnosis not present

## 2024-04-29 DIAGNOSIS — N183 Chronic kidney disease, stage 3 unspecified: Secondary | ICD-10-CM | POA: Diagnosis not present

## 2024-04-29 DIAGNOSIS — E039 Hypothyroidism, unspecified: Secondary | ICD-10-CM | POA: Diagnosis not present

## 2024-04-29 DIAGNOSIS — Z95811 Presence of heart assist device: Secondary | ICD-10-CM

## 2024-04-29 DIAGNOSIS — Z7901 Long term (current) use of anticoagulants: Secondary | ICD-10-CM | POA: Insufficient documentation

## 2024-04-29 DIAGNOSIS — Z79890 Hormone replacement therapy (postmenopausal): Secondary | ICD-10-CM | POA: Diagnosis not present

## 2024-04-29 DIAGNOSIS — I4892 Unspecified atrial flutter: Secondary | ICD-10-CM | POA: Diagnosis not present

## 2024-04-29 DIAGNOSIS — J449 Chronic obstructive pulmonary disease, unspecified: Secondary | ICD-10-CM | POA: Diagnosis not present

## 2024-04-29 DIAGNOSIS — I428 Other cardiomyopathies: Secondary | ICD-10-CM | POA: Diagnosis not present

## 2024-04-29 DIAGNOSIS — I509 Heart failure, unspecified: Secondary | ICD-10-CM | POA: Diagnosis not present

## 2024-04-29 DIAGNOSIS — Z794 Long term (current) use of insulin: Secondary | ICD-10-CM | POA: Diagnosis not present

## 2024-04-29 DIAGNOSIS — Z95810 Presence of automatic (implantable) cardiac defibrillator: Secondary | ICD-10-CM | POA: Insufficient documentation

## 2024-04-29 DIAGNOSIS — Z7984 Long term (current) use of oral hypoglycemic drugs: Secondary | ICD-10-CM | POA: Insufficient documentation

## 2024-04-29 DIAGNOSIS — Z4801 Encounter for change or removal of surgical wound dressing: Secondary | ICD-10-CM | POA: Diagnosis present

## 2024-04-29 DIAGNOSIS — I272 Pulmonary hypertension, unspecified: Secondary | ICD-10-CM | POA: Diagnosis not present

## 2024-04-29 DIAGNOSIS — I4891 Unspecified atrial fibrillation: Secondary | ICD-10-CM | POA: Diagnosis not present

## 2024-04-29 DIAGNOSIS — Z79899 Other long term (current) drug therapy: Secondary | ICD-10-CM | POA: Insufficient documentation

## 2024-04-29 DIAGNOSIS — I519 Heart disease, unspecified: Secondary | ICD-10-CM | POA: Insufficient documentation

## 2024-04-29 DIAGNOSIS — I5022 Chronic systolic (congestive) heart failure: Secondary | ICD-10-CM | POA: Insufficient documentation

## 2024-04-29 DIAGNOSIS — I517 Cardiomegaly: Secondary | ICD-10-CM | POA: Insufficient documentation

## 2024-04-29 DIAGNOSIS — E1122 Type 2 diabetes mellitus with diabetic chronic kidney disease: Secondary | ICD-10-CM | POA: Diagnosis not present

## 2024-04-29 LAB — CBC
HCT: 53.1 % — ABNORMAL HIGH (ref 39.0–52.0)
Hemoglobin: 17.4 g/dL — ABNORMAL HIGH (ref 13.0–17.0)
MCH: 27.1 pg (ref 26.0–34.0)
MCHC: 32.8 g/dL (ref 30.0–36.0)
MCV: 82.8 fL (ref 80.0–100.0)
Platelets: 257 K/uL (ref 150–400)
RBC: 6.41 MIL/uL — ABNORMAL HIGH (ref 4.22–5.81)
RDW: 19.4 % — ABNORMAL HIGH (ref 11.5–15.5)
WBC: 6.5 K/uL (ref 4.0–10.5)
nRBC: 0 % (ref 0.0–0.2)

## 2024-04-29 LAB — T4, FREE: Free T4: 1.44 ng/dL — ABNORMAL HIGH (ref 0.61–1.12)

## 2024-04-29 LAB — BASIC METABOLIC PANEL WITH GFR
Anion gap: 12 (ref 5–15)
BUN: 25 mg/dL — ABNORMAL HIGH (ref 8–23)
CO2: 27 mmol/L (ref 22–32)
Calcium: 9.2 mg/dL (ref 8.9–10.3)
Chloride: 97 mmol/L — ABNORMAL LOW (ref 98–111)
Creatinine, Ser: 1.39 mg/dL — ABNORMAL HIGH (ref 0.61–1.24)
GFR, Estimated: 58 mL/min — ABNORMAL LOW (ref >60)
Glucose, Bld: 118 mg/dL — ABNORMAL HIGH (ref 70–99)
Potassium: 4.6 mmol/L (ref 3.5–5.1)
Sodium: 136 mmol/L (ref 135–145)

## 2024-04-29 LAB — DIGOXIN LEVEL: Digoxin Level: <0.6 ng/mL — ABNORMAL LOW (ref 0.8–2.0)

## 2024-04-29 LAB — TSH: TSH: 2.441 u[IU]/mL (ref 0.350–4.500)

## 2024-04-29 LAB — PROTIME-INR
INR: 1.4 — ABNORMAL HIGH (ref 0.8–1.2)
Prothrombin Time: 18.1 s — ABNORMAL HIGH (ref 11.4–15.2)

## 2024-04-29 LAB — LACTATE DEHYDROGENASE: LDH: 287 U/L — ABNORMAL HIGH (ref 98–192)

## 2024-04-29 MED ORDER — DIGOXIN 125 MCG PO TABS: 0.0625 mg | ORAL_TABLET | Freq: Every day | ORAL | 5 refills | Status: AC

## 2024-04-29 NOTE — Progress Notes (Addendum)
 Pt presents for 2 month f/u in VAD clinic today alone. Denies issues with VAD equipment or drive line.   Pt walked into clinic independently. States he is overall feeling well. Denies lightheadedness, dizziness, falls, shortness of breath, and signs of bleeding. Reports he has been juicing, and taking ginger. Occasionally having diarrhea, which he attributes to juicing. Encouraged to drink at least 2L per day- he verbalized understanding.   Wearing Freestyle Libre. Taking all medications as prescribed. Stopped Metoprolol , and started Entresto  as prescribed last visit. No medication changes today per Dr Rolan.   Listed for transplant Status 4 at [facility]. He had follow up with [facility] in 03/27/24. He is unsure of when his next appt is.   Vital Signs:  Temp:  Doppler: 96     Automatic BP: 115/78 (88)   HR: 82 NSR     SpO2: 96% on RA  Weight: 204.4 lb w/ equip Last weight: 207.6 lb w/ equip  Destination Therapy due to uncontrolled diabetes   LVAD assessment: HM III:  VAD Speed: 5700 rpms Flow: 4.9     Power: 4.4 w       PI: 4.8     Hct: 49   Alarms: none Events: 10 - 20 daily  Fixed speed: 5700 Low speed limit: 5300  Primary controller: back up battery due for replacement in 29 months Secondary controller: back up battery due for replacement in 7 months -- not present today  I reviewed the LVAD parameters from today and compared the results to the patient's prior recorded data. LVAD interrogation was NEGATIVE for significant power changes, NEGATIVE for clinical alarms and STABLE for PI events/speed drops. No programming changes were made and pump is functioning within specified parameters. Pt is performing daily controller and system monitor self tests along with completing weekly and monthly maintenance for LVAD equipment.   LVAD equipment check completed and is in good working order. Back-up equipment not present today.   Annual Equipment Maintenance on UBC/PM was performed on 06/2023.    Exit Site Care: Existing VAD dressing CDI. Anchor secure. Pt denies issues with dressing changes or tenderness at exit site Pt given 8 weekly kits and 2 boxes of tegaderm for home use.  BP & Labs:  Doppler 96 - Doppler is reflecting modified systolic   Hgb 17.4- No S/S of bleeding. Specifically denies melena/BRBPR or nosebleeds.   LDH stable at 287 - Denies tea-colored urine. No power elevations noted on interrogation.   Patient Instructions: No medication changes Coumadin  dosing per Darryl Frye PharmD Return to clinic in 2 months. We will complete your 2 year Intermacs at this visit. Please wear tennis shoes for your 6 minute walk. Please bring your black bag, battery charger, and MPU (wall unit) for annual maintenance.   Darryl Knoll RN VAD Coordinator  Office: [phone]  24/7 Pager: [phone]     ADVANCED HF/VAD CLINIC NOTE  Chief complaint: CHF  Darryl Frye is a 63 y.o. with history of atrial fibrillation/flutter and nonischemic cardiomyopathy who was initially referred by Dr. Alvan for evaluation of CHF.  Patient was diagnosed with CHF in 2020 at [facility] in [place].  At the time, he drank heavily and used cocaine.  He stopped drinking, smoking, and cocaine in 2020 and has not used any of these substances since then. Echo in 11/20 showed EF < 10%, cath in 12/20 showed normal coronaries.  Echo has shown persistently low EF over time, echo in 2/23 showed EF 20-25%.  He has a Medtronic  ICD.      Patient was admitted in 2/23 with atrial flutter/RVR. He was started on amiodarone  and converted to NSR.  He had atrial flutter ablation in 5/23 and amiodarone  was stopped  Echo was done in 11/23 showing elevated filling pressure and markedly low cardiac output.  Patient was admitted, started on milrinone , and underwent HM3 LVAD placement with LAA clipping later in 11/23.  Post-op course complicated by acute hypoxemic respiratory failure post-op and PNA.  Patient went to CIR after acute  hospital course.   Ramp echo was done in 4/24, at 5500 bpm, the IV septum was shifted mildly to the left with moderate RV dilation and dysfunction, AoV opened rarely.  Speed decreased to 5400 bpm, slight opening AoV every beat, trivial AI, midline IV septum.  Speed left at 5400 bpm.   Ramp echo was done today.  Speed started at 5400 rpm and was increased to 5600 rpm.  Flow increased from 4.9 L/min to 5.2 L/min.  The aortic valve opened every beat still at 5600 rpm but the septum started out rightward and ended up midline at 5600 rpm.  Speed was left at 5600 rpm. There was moderate RV dysfunction, IVC normal.   Patient had RHC in 3/25 with low output by thermodilution, preserved by Fick. LVAD speed increased to 5700 rpm.   Ramp echo was done in 5/25, slight aortic valve opening with each beat, trivial AI, RV moderately dilated and dysfunctional. Speed was left at 5700 rpm.   Patient returns for LVAD followup.  He is listed for transplant at [facility], UNOS stage 4.  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  No significant exertional dyspnea.  No lightheadedness.  No BRBPR/melena.  No LVAD alarms. Weight down 3 lbs.   Labs (7/24): K 4, creatinine 1.39, hgb 12.3, LDH 219 Labs (10/24): K 4.6, creatinine 1.25, hgb 12.1, LDH 183 Labs (12/24): K 4.3, creatinine 1.2, hgb 13.2 Labs (2/25): K 3.8, creatinine 1.18, hgb 14.2 => 13.2 Labs (5/25): K 3.8, creatinine 1.33, hgb 15.3, LDH 265 Labs (8/25): hgb 16.4, K 4.3, creatinine 1.25, LFTs normal, ferritin 21 with transferrin saturation 11%  PMH: 1. Atrial fibrillation/flutter: Mainly flutter.  - 5/23 Atrial flutter ablation.  2. H/o DVT 3. Chronic systolic CHF: Nonischemic cardiomyopathy.  Medtronic ICD.  - Echo (11/20): EF < 10% - Cath (12/20): Normal coronaries.  - Echo (8/22): EF 30%, mildly decreased RV systolic function.  - Echo (2/23): EF 20-25%, normal RV.  - RHC (11/23): mean RA 14, PA 66/53 mean 54, mean PCWP 43, CI 1.62 F/1.31  T - HM3 LVAD with LAA clipping on 07/04/22.  - Ramp echo (2/24): Speed increased to 5500 rpm.  At this speed, IV septum was midline.  Aortic valve opened 1/3 beats.  Mild RV enlargement with moderate RV dysfunction.  - Ramp echo (3/24): Speed decreased back to 5400 rpm as the IV septum was shifted left.  Midline septum at 5400 rpm.  - Ramp echo (2/25): Speed increased to 5600 rpm with IV septum midline at 5600.  Moderate RV dysfunction.  - RHC (3/25): mean RA 8, PA 29/10, mean PCWP 8, CI 2.38 F/1.63 T.  - Ramp echo (5/25): Moderately dilated and dysfunctional RV.  Speed left at 5700 rpm.  4. Prior cocaine abuse.  5. Prior heavy ETOH 6. COPD: Moderate obstruction on 11/23 PFTs. No longer smokes.  7. Type 2 diabetes   SH: Patient was a heavy drinker, abused cocaine, and smoked in the  past but quit in 2020.  Lives in [place]. He does part-time custodial work.    FH: Mother with PPM, aunt with CHF.    VAD Indication: Destination Therapy due to uncontrolled diabetes   LVAD assessment: HM III: See LVAD nurse's note above. I reviewed the parameters personally and they appear stable.    ROS: All systems negative except as listed in HPI, PMH and Problem List.  Current Outpatient Medications  Medication Sig Dispense Refill   acetaminophen  (TYLENOL ) 325 MG tablet Take 1-2 tablets (325-650 mg total) by mouth every 4 (four) hours as needed for mild pain.     atorvastatin  (LIPITOR) 40 MG tablet Take 40 mg by mouth daily.     Calcium  Carb-Cholecalciferol  600-10 MG-MCG TABS Take 1 tablet by mouth daily with breakfast. 90 tablet 3   Continuous Glucose Receiver (FREESTYLE LIBRE 3 READER) DEVI 1 each by Does not apply route daily. 2 each 3   Continuous Glucose Sensor (FREESTYLE LIBRE 3 SENSOR) MISC PLACE 1 SENSOR ON THE SKIN EVERY 14 DAYS 2 each 3   empagliflozin  (JARDIANCE ) 25 MG TABS tablet Take 1 tablet (25 mg total) by mouth daily. 90 tablet 1   insulin  glargine (LANTUS  SOLOSTAR) 100 UNIT/ML  Solostar Pen Inject 40 Units into the skin at bedtime. 45 mL 1   insulin  lispro (HUMALOG ) 100 UNIT/ML KwikPen Use only when pre-meal glucose is above 150 30 mL 0   Insulin  Pen Needle (DROPLET PEN NEEDLES) 32G X 4 MM MISC USE TO INJECT 4 TIMES A DAY 100 each 1   levothyroxine  (SYNTHROID ) 25 MCG tablet Take 1 tablet (25 mcg total) by mouth daily before breakfast. 90 tablet 3   magnesium  oxide (MAG-OX) 400 (240 Mg) MG tablet TAKE 1 TABLET(400 MG) BY MOUTH TWICE DAILY 180 tablet 3   Multiple Vitamin (MULTIVITAMIN WITH MINERALS) TABS tablet Take 1 tablet by mouth daily.     pantoprazole  (PROTONIX ) 40 MG tablet Take 1 tablet (40 mg total) by mouth daily. 90 tablet 3   potassium chloride  SA (KLOR-CON  M) 20 MEQ tablet Take 2 tablets (40 mEq total) by mouth daily. 180 tablet 3   sacubitril -valsartan  (ENTRESTO ) 24-26 MG Take 1 tablet by mouth 2 (two) times daily. 60 tablet 3   sertraline  (ZOLOFT ) 50 MG tablet Take 1 tablet (50 mg total) by mouth daily. 90 tablet 3   sildenafil  (REVATIO ) 20 MG tablet TAKE 1 TABLET(20 MG) BY MOUTH THREE TIMES DAILY 90 tablet 6   SPIRIVA  HANDIHALER 18 MCG inhalation capsule PLACE 1 CAPSULE INTO INHALER AND INHALE EVERY DAY 30 capsule 12   spironolactone  (ALDACTONE ) 25 MG tablet Take 1 tablet (25 mg total) by mouth at bedtime. 90 tablet 3   torsemide  (DEMADEX ) 20 MG tablet Take 3 tablets (60 mg total) by mouth 2 (two) times daily. 180 tablet 10   warfarin (COUMADIN ) 3 MG tablet TAKE 2 TABLETS BY MOUTH EVERY TUESDAY, THURSDAYS AND SATURDAY AND 1 TABLET ALL OTHER DAYS (Patient taking differently: Take 3 mg by mouth daily at 4 PM. Take 3 mg (1 tablet) on Mondays & Fridays. Take 6 mg (2 tablets) all other days, or as directed by the heart failure clinic.) 120 tablet 6   digoxin  (LANOXIN ) 0.125 MG tablet Take 0.5 tablets (0.0625 mg total) by mouth daily. 30 tablet 5   enoxaparin  (LOVENOX ) 40 MG/0.4ML injection Inject 0.4 mLs (40 mg total) into the skin every 12 (twelve) hours. (Patient  not taking: Reported on 04/29/2024) 4 mL 0   Fe Fum-Vit  C-Vit B12-FA (TRIGELS-F FORTE) CAPS capsule Take 1 capsule by mouth daily after breakfast. (Patient not taking: Reported on 04/29/2024) 30 capsule 6   No current facility-administered medications for this encounter.    Patient has no known allergies.  Vital Signs: BP 115/78 Comment: map 88  Pulse 82   Wt 92.7 kg (204 lb 6.4 oz)   SpO2 96%   BMI 28.51 kg/m  MAP 88  Physical Exam: General: Well appearing this am. NAD.  HEENT: Normal. Neck: Supple, JVP 8 cm. Carotids OK.  Cardiac:  Mechanical heart sounds with LVAD hum present.  Lungs:  CTAB, normal effort.  Abdomen:  NT, ND, no HSM. No bruits or masses. +BS  LVAD exit site: Well-healed and incorporated. Dressing dry and intact. No erythema or drainage. Stabilization device present and accurately applied. Driveline dressing changed daily per sterile technique. Extremities:  Warm and dry. No cyanosis, clubbing, rash, or edema.  Neuro:  Alert & oriented x 3. Cranial nerves grossly intact. Moves all 4 extremities w/o difficulty. Affect pleasant    ASSESSMENT AND PLAN:   1. Chronic systolic CHF: Nonischemic cardiomyopathy, diagnosed 2020.  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  Cath in 12/20 with no significant coronary disease.  Medtronic ICD. Echo in 2/23 showed EF 20-25% with normal RV.  RHC in 11/13 showed markedly elevated filling pressures, primarily pulmonary venous hypertension, low cardiac output, and low PAPI. Echo in 11/23 with EF <20%, mod RV dysfunction.  He was admitted and started on milrinone  with improvement in hemodynamics.  We proceeded with HM3 LVAD with LAA clip on 07/04/22.  Ramp echo was done in 2/25, speed increased to 5600 rpm.  RHC in 3/25, speed increased to 5700 rpm.  Ramp echo in 5/25  showed moderate RV dilation/moderate RV dysfunction, speed left at 5700 rpm.  LVAD parameters reviewed and stable.  Volume status looks ok, MAP controlled, NYHA class I.  He is not significantly volume overloaded on exam.  - Continue torsemide  60 mg bid, will not change dose.  BMET today.  - Continue Entresto  24/26 bid.  - Continue Jardiance  10 mg daily.  - Continue sildenafil  20 tid for RV.  - Continue spironolactone  25 mg daily.   - Continue digoxin  0.0625 daily, check level today.  - Off beta blocker with RV dysfunction.  - He is now on the transplant list at [facility], UNOS status 4.  I worry about him Novitsky-term with an LVAD given development of RV failure.   2. Atrial fibrillation/atrial flutter: H/o flutter ablation.  Had AF/RVR post-LVAD, went back into NSR.  He is now off amiodarone .  - Continue warfarin.  3. VAD management: s/p HM-3 VAD on 07/04/22. Ramp echo in 4/24, speed decreased to 5400 rpm.  Stable LVAD parameters.  Ramp echo in 5/25 with speed maintained at 5700.  - Now off ASA.  - Continue warfarin, INR goal 2-2.5. 4. H/o DVT: On anticoagulation.  5. CKD stage 3: On Jardiance .  - BMET today.  6. Hypothyroidism - On levothyroxine .  7. Anemia:   - CBC today.  8. COPD: Moderate on 11/23 PFTs.  Suspect this plays a role in his dyspnea.  - Continue Spiriva .     9. Diabetes: Endocrinology followup.  Followup in 2 months.   I spent 42 minutes reviewing data and LVAD parameters, interviewing patient, and organizing the orders/followup.   Ezra Shuck 04/29/2024

## 2024-04-29 NOTE — Patient Instructions (Signed)
 No medication changes Coumadin  dosing per Lauren PharmD Return to clinic in 2 months. We will complete your 2 year Intermacs at this visit. Please wear tennis shoes for your 6 minute walk. Please bring your black bag, battery charger, and MPU (wall unit) for annual maintenance.

## 2024-04-30 LAB — T3, FREE: T3, Free: 2.8 pg/mL (ref 2.0–4.4)

## 2024-05-05 ENCOUNTER — Ambulatory Visit (HOSPITAL_COMMUNITY): Payer: Self-pay | Admitting: Pharmacist

## 2024-05-05 LAB — POCT INR: INR: 1.8 — AB (ref 2.0–3.0)

## 2024-05-07 ENCOUNTER — Ambulatory Visit (INDEPENDENT_AMBULATORY_CARE_PROVIDER_SITE_OTHER): Admitting: Gastroenterology

## 2024-05-07 ENCOUNTER — Encounter: Payer: Self-pay | Admitting: Oncology

## 2024-05-07 ENCOUNTER — Encounter (INDEPENDENT_AMBULATORY_CARE_PROVIDER_SITE_OTHER): Payer: Self-pay | Admitting: Gastroenterology

## 2024-05-07 VITALS — BP 132/88 | HR 54 | Temp 97.0°F | Ht 71.0 in | Wt 206.1 lb

## 2024-05-07 DIAGNOSIS — D500 Iron deficiency anemia secondary to blood loss (chronic): Secondary | ICD-10-CM

## 2024-05-07 DIAGNOSIS — Z860102 Personal history of hyperplastic colon polyps: Secondary | ICD-10-CM

## 2024-05-07 NOTE — Patient Instructions (Signed)
 It was very nice to meet you today, as dicussed with will plan for the following :  1) Will refer you to [facility] for upper endoscopy and colonoscopy

## 2024-05-07 NOTE — Progress Notes (Signed)
 Darryl Frye , M.D. Gastroenterology & Hepatology [facility] Gastroenterology [address] [place], [place] [address] Primary Care Physician: Vick Lurie, FNP [address] [place] [address]  Chief Complaint:  iron deficiency anemia   History of Present Illness: Darryl Frye is a 63 y.o. male with atrial fibrillation/flutter and nonischemic cardiomyopathy HF (EF 20%) with ICD and LVAD who presents for evaluation of Iron deficiency anemia   Patient was last seen in 2023 by [facility] for colonoscopy and given severely reduced EF he was recommended colonoscopy to be done in a high-level health care center  Patient is again referred today by hematology for evaluation of iron deficiency anemia.  Patient has received 2 iron infusions.  No active GI complaints . The patient denies having any nausea, vomiting, fever, chills, hematochezia, melena, hematemesis, abdominal distention, abdominal pain, diarrhea, jaundice, pruritus or weight loss.  Labs from 03/2024 ferritin 21 hemoglobin 17.4 later in 2024 was close 9.1  Last ZHI:wnwz Last Colonoscopy:2016 last colonoscopy dated 02/09/2015 [facility]. He had 1 hyperplastic polyp removed from his rectum.   Past Medical History: Past Medical History:  Diagnosis Date   Acute kidney injury (HCC)    Acute respiratory failure (HCC)    Atrial flutter (HCC)    on Eliquis    CHF (congestive heart failure) (HCC)    EF 20-25% 10/15/21, AICD in place.   Coronary artery disease    Diabetes mellitus without complication (HCC)    DVT (deep venous thrombosis) (HCC) 06/2019   Heart murmur    Hypertension    Presence of permanent cardiac pacemaker    Renal disorder     Past Surgical History: Past Surgical History:  Procedure Laterality Date   A-FLUTTER ABLATION N/A 01/12/2022   Procedure: A-FLUTTER ABLATION;  Surgeon: Waddell Danelle ORN, MD;  Location: [facility] INVASIVE CV LAB;  Service: Cardiovascular;   Laterality: N/A;   COLONOSCOPY     Dr. Tobie; One 4-6 mm hyperplastic rectal polyp, mid left-sided diverticulosis.   CYST REMOVAL NECK     HERNIA REPAIR     INSERTION OF IMPLANTABLE LEFT VENTRICULAR ASSIST DEVICE N/A 07/04/2022   Procedure: INSERTION OF IMPLANTABLE LEFT VENTRICULAR ASSIST DEVICE; ATRICLIP 45;  Surgeon: Murriel Toribio DEL, MD;  Location: [facility] OR;  Service: Open Heart Surgery;  Laterality: N/A;   RIGHT HEART CATH N/A 06/27/2022   Procedure: RIGHT HEART CATH;  Surgeon: Rolan Ezra RAMAN, MD;  Location: [facility] INVASIVE CV LAB;  Service: Cardiovascular;  Laterality: N/A;   RIGHT HEART CATH N/A 06/29/2022   Procedure: RIGHT HEART CATH;  Surgeon: Rolan Ezra RAMAN, MD;  Location: [facility] INVASIVE CV LAB;  Service: Cardiovascular;  Laterality: N/A;   RIGHT HEART CATH N/A 10/29/2023   Procedure: RIGHT HEART CATH;  Surgeon: Rolan Ezra RAMAN, MD;  Location: [facility] INVASIVE CV LAB;  Service: Cardiovascular;  Laterality: N/A;   TEE WITHOUT CARDIOVERSION N/A 07/04/2022   Procedure: TRANSESOPHAGEAL ECHOCARDIOGRAM (TEE);  Surgeon: Murriel Toribio DEL, MD;  Location: [facility] OR;  Service: Open Heart Surgery;  Laterality: N/A;   TONSILLECTOMY      Family History: Family History  Problem Relation Age of Onset   Hyperlipidemia Mother    Heart disease Mother        has PPM   Diabetes Mother    Hypertension Mother    Heart disease Maternal Grandmother    Prostate cancer Maternal Grandfather    Colon cancer Neg Hx    Colon polyps Neg Hx  Social History: Social History   Tobacco Use  Smoking Status Former   Current packs/day: 0.00   Average packs/day: 1 pack/day for 20.0 years (20.0 ttl pk-yrs)   Types: Cigarettes   Start date: 07/07/1999   Quit date: 07/07/2019   Years since quitting: 4.8  Smokeless Tobacco Never   Social History   Substance and Sexual Activity  Alcohol Use Not Currently   Social History   Substance and Sexual Activity  Drug Use Not Currently   Types: Crack cocaine   Comment:  last used over 2 years ago (10/24/21).    Allergies: No Known Allergies  Medications: Current Outpatient Medications  Medication Sig Dispense Refill   acetaminophen  (TYLENOL ) 325 MG tablet Take 1-2 tablets (325-650 mg total) by mouth every 4 (four) hours as needed for mild pain.     atorvastatin  (LIPITOR) 40 MG tablet Take 40 mg by mouth daily.     Calcium  Carb-Cholecalciferol  600-10 MG-MCG TABS Take 1 tablet by mouth daily with breakfast. 90 tablet 3   Continuous Glucose Receiver (FREESTYLE LIBRE 3 READER) DEVI 1 each by Does not apply route daily. 2 each 3   Continuous Glucose Sensor (FREESTYLE LIBRE 3 SENSOR) MISC PLACE 1 SENSOR ON THE SKIN EVERY 14 DAYS 2 each 3   digoxin  (LANOXIN ) 0.125 MG tablet Take 0.5 tablets (0.0625 mg total) by mouth daily. 30 tablet 5   empagliflozin  (JARDIANCE ) 25 MG TABS tablet Take 1 tablet (25 mg total) by mouth daily. 90 tablet 1   enoxaparin  (LOVENOX ) 40 MG/0.4ML injection Inject 0.4 mLs (40 mg total) into the skin every 12 (twelve) hours. 4 mL 0   Fe Fum-Vit C-Vit B12-FA (TRIGELS-F FORTE) CAPS capsule Take 1 capsule by mouth daily after breakfast. 30 capsule 6   insulin  glargine (LANTUS  SOLOSTAR) 100 UNIT/ML Solostar Pen Inject 40 Units into the skin at bedtime. 45 mL 1   insulin  lispro (HUMALOG ) 100 UNIT/ML KwikPen Use only when pre-meal glucose is above 150 30 mL 0   Insulin  Pen Needle (DROPLET PEN NEEDLES) 32G X 4 MM MISC USE TO INJECT 4 TIMES A DAY 100 each 1   levothyroxine  (SYNTHROID ) 25 MCG tablet Take 1 tablet (25 mcg total) by mouth daily before breakfast. 90 tablet 3   magnesium  oxide (MAG-OX) 400 (240 Mg) MG tablet TAKE 1 TABLET(400 MG) BY MOUTH TWICE DAILY 180 tablet 3   Multiple Vitamin (MULTIVITAMIN WITH MINERALS) TABS tablet Take 1 tablet by mouth daily.     pantoprazole  (PROTONIX ) 40 MG tablet Take 1 tablet (40 mg total) by mouth daily. 90 tablet 3   potassium chloride  SA (KLOR-CON  M) 20 MEQ tablet Take 2 tablets (40 mEq total) by mouth  daily. 180 tablet 3   sacubitril -valsartan  (ENTRESTO ) 24-26 MG Take 1 tablet by mouth 2 (two) times daily. 60 tablet 3   sertraline  (ZOLOFT ) 50 MG tablet Take 1 tablet (50 mg total) by mouth daily. 90 tablet 3   sildenafil  (REVATIO ) 20 MG tablet TAKE 1 TABLET(20 MG) BY MOUTH THREE TIMES DAILY 90 tablet 6   SPIRIVA  HANDIHALER 18 MCG inhalation capsule PLACE 1 CAPSULE INTO INHALER AND INHALE EVERY DAY 30 capsule 12   spironolactone  (ALDACTONE ) 25 MG tablet Take 1 tablet (25 mg total) by mouth at bedtime. 90 tablet 3   torsemide  (DEMADEX ) 20 MG tablet Take 3 tablets (60 mg total) by mouth 2 (two) times daily. 180 tablet 10   warfarin (COUMADIN ) 3 MG tablet TAKE 2 TABLETS BY MOUTH EVERY TUESDAY, THURSDAYS AND SATURDAY  AND 1 TABLET ALL OTHER DAYS (Patient taking differently: Take 3 mg by mouth daily at 4 PM. Take 3 mg (1 tablet) on Mondays & Fridays. Take 6 mg (2 tablets) all other days, or as directed by the heart failure clinic.) 120 tablet 6   No current facility-administered medications for this visit.    Review of Systems: GENERAL: negative for malaise, night sweats HEENT: No changes in hearing or vision, no nose bleeds or other nasal problems. NECK: Negative for lumps, goiter, pain and significant neck swelling RESPIRATORY: Negative for cough, wheezing CARDIOVASCULAR: Negative for chest pain, leg swelling, palpitations, orthopnea GI: SEE HPI MUSCULOSKELETAL: Negative for joint pain or swelling, back pain, and muscle pain. SKIN: Negative for lesions, rash HEMATOLOGY Negative for prolonged bleeding, bruising easily, and swollen nodes. ENDOCRINE: Negative for cold or heat intolerance, polyuria, polydipsia and goiter. NEURO: negative for tremor, gait imbalance, syncope and seizures. The remainder of the review of systems is noncontributory.   Physical Exam: BP 132/88   Pulse (!) 54   Temp (!) 97 F (36.1 C)   Ht 5' 11 (1.803 m)   Wt 206 lb 1.6 oz (93.5 kg)   BMI 28.75 kg/m  GENERAL:  The patient is AO x3, in no acute distress. HEENT: Head is normocephalic and atraumatic. EOMI are intact. Mouth is well hydrated and without lesions. NECK: Supple. No masses LUNGS: Clear to auscultation. No presence of rhonchi/wheezing/rales. Adequate chest expansion HEART: RRR, normal s1 and s2. ABDOMEN: Soft, nontender, no guarding, no peritoneal signs, and nondistended. BS +. No masses.  Imaging/Labs: as above     Latest Ref Rng & Units 04/29/2024   11:00 AM 04/03/2024    1:24 PM 02/25/2024   11:10 AM  CBC  WBC 4.0 - 10.5 K/uL 6.5  6.3  6.8   Hemoglobin 13.0 - 17.0 g/dL 82.5  83.5  84.1   Hematocrit 39.0 - 52.0 % 53.1  50.9  49.2   Platelets 150 - 400 K/uL 257  307  307    Lab Results  Component Value Date   IRON 39 (L) 04/03/2024   TIBC 359 04/03/2024   FERRITIN 21 (L) 04/03/2024    I personally reviewed and interpreted the available labs, imaging and endoscopic files.  Impression and Plan:  Darryl Frye is a 63 y.o. male with atrial fibrillation/flutter and nonischemic cardiomyopathy HF (EF 20%) with ICD and LVAD who presents for evaluation of Iron deficiency anemia   #Iron deficiency anemia  Labs from 03/2024 ferritin 21 hemoglobin 17.4 , Although patient nadir hemoglobin in 2024 was 9.1  Left ventricular assist device (LVAD) therapy is associated with a high prevalence of iron deficiency anemia  Patient last colonoscopy was 2016 at [facility] with 1 hyperplastic polyp and suggested repeat was 10 years  As per ACG guidelines , bidirectional endoscopy is recommended over iron replacement therapy only.  Although patient has significant cardiac comorbidities with severely reduced EF with LVAD which put him at risk for intraprocedural adverse effects mostly due to anesthesia  I reached out to our anesthesiologist here at [facility] ( Dr. Kendell) who also suggest that patient to have elective procedure done at a tertiary center ( given EF<30 with LVAD ) and limited resources  here   At patient's request I will refer him to [facility] GI for further evaluation  All questions were answered.      Alfretta Pinch Faizan Tiffancy Moger, MD Gastroenterology and Hepatology [facility] Gastroenterology   This chart has been completed  using [employer], and while attempts have been made to ensure accuracy , certain words and phrases may not be transcribed as intended

## 2024-05-08 ENCOUNTER — Ambulatory Visit (INDEPENDENT_AMBULATORY_CARE_PROVIDER_SITE_OTHER)

## 2024-05-08 DIAGNOSIS — I509 Heart failure, unspecified: Secondary | ICD-10-CM

## 2024-05-08 LAB — CUP PACEART REMOTE DEVICE CHECK
Battery Remaining Longevity: 73 mo
Battery Voltage: 2.99 V
Brady Statistic AP VP Percent: 0.01 %
Brady Statistic AP VS Percent: 5.8 %
Brady Statistic AS VP Percent: 0.01 %
Brady Statistic AS VS Percent: 94.18 %
Brady Statistic RA Percent Paced: 5.78 %
Brady Statistic RV Percent Paced: 0.02 %
Date Time Interrogation Session: 20250918082503
HighPow Impedance: 76 Ohm
Lead Channel Impedance Value: 342 Ohm
Lead Channel Impedance Value: 418 Ohm
Lead Channel Impedance Value: 456 Ohm
Lead Channel Pacing Threshold Amplitude: 0.625 V
Lead Channel Pacing Threshold Amplitude: 2.5 V
Lead Channel Pacing Threshold Pulse Width: 0.4 ms
Lead Channel Pacing Threshold Pulse Width: 0.4 ms
Lead Channel Sensing Intrinsic Amplitude: 4.5 mV
Lead Channel Sensing Intrinsic Amplitude: 4.5 mV
Lead Channel Sensing Intrinsic Amplitude: 6 mV
Lead Channel Sensing Intrinsic Amplitude: 6 mV
Lead Channel Setting Pacing Amplitude: 1.5 V
Lead Channel Setting Pacing Amplitude: 3 V
Lead Channel Setting Pacing Pulse Width: 1 ms
Lead Channel Setting Sensing Sensitivity: 0.3 mV
Zone Setting Status: 755011
Zone Setting Status: 755011

## 2024-05-12 ENCOUNTER — Ambulatory Visit (HOSPITAL_COMMUNITY): Payer: Self-pay | Admitting: Pharmacist

## 2024-05-12 LAB — POCT INR: INR: 1.8 — AB (ref 2.0–3.0)

## 2024-05-13 NOTE — Progress Notes (Signed)
Remote ICD Transmission.

## 2024-05-17 ENCOUNTER — Ambulatory Visit: Payer: Self-pay | Admitting: Internal Medicine

## 2024-05-19 ENCOUNTER — Ambulatory Visit (HOSPITAL_COMMUNITY): Payer: Self-pay | Admitting: Pharmacist

## 2024-05-19 LAB — POCT INR: INR: 1.7 — AB (ref 2.0–3.0)

## 2024-05-26 ENCOUNTER — Ambulatory Visit (HOSPITAL_COMMUNITY): Payer: Self-pay | Admitting: Pharmacist

## 2024-05-26 LAB — POCT INR: INR: 2.3 (ref 2.0–3.0)

## 2024-06-03 ENCOUNTER — Ambulatory Visit (HOSPITAL_COMMUNITY): Payer: Self-pay | Admitting: Pharmacist

## 2024-06-03 LAB — POCT INR: INR: 2.8 (ref 2.0–3.0)

## 2024-06-09 ENCOUNTER — Ambulatory Visit (HOSPITAL_COMMUNITY): Payer: Self-pay | Admitting: Pharmacist

## 2024-06-09 LAB — POCT INR: INR: 2.9 (ref 2.0–3.0)

## 2024-06-16 ENCOUNTER — Ambulatory Visit (HOSPITAL_COMMUNITY): Payer: Self-pay | Admitting: Pharmacist

## 2024-06-16 LAB — POCT INR: INR: 1.9 — AB (ref 2.0–3.0)

## 2024-06-20 ENCOUNTER — Other Ambulatory Visit (HOSPITAL_COMMUNITY): Payer: Self-pay | Admitting: Cardiology

## 2024-06-20 DIAGNOSIS — I509 Heart failure, unspecified: Secondary | ICD-10-CM

## 2024-06-20 DIAGNOSIS — F418 Other specified anxiety disorders: Secondary | ICD-10-CM

## 2024-06-20 DIAGNOSIS — Z95811 Presence of heart assist device: Secondary | ICD-10-CM

## 2024-06-23 ENCOUNTER — Telehealth (HOSPITAL_COMMUNITY): Payer: Self-pay | Admitting: Unknown Physician Specialty

## 2024-06-23 NOTE — Telephone Encounter (Signed)
 Pt called the VAD office stating that he is out of test strips to check his INR. Pt informed that he will need to order more. Pt informed that if it is going to be longer than a week before they arrive we will need to schedule him for an INR at [facility]. Pt verbalized understanding.  Lauraine Ip RN, BSN VAD Coordinator 24/7 Pager [phone]

## 2024-06-25 ENCOUNTER — Other Ambulatory Visit (HOSPITAL_COMMUNITY): Payer: Self-pay | Admitting: Unknown Physician Specialty

## 2024-06-25 ENCOUNTER — Ambulatory Visit (HOSPITAL_COMMUNITY): Payer: Self-pay | Admitting: Pharmacist

## 2024-06-25 DIAGNOSIS — Z7901 Long term (current) use of anticoagulants: Secondary | ICD-10-CM

## 2024-06-25 DIAGNOSIS — Z95811 Presence of heart assist device: Secondary | ICD-10-CM

## 2024-06-25 LAB — POCT INR: INR: 2 (ref 2.0–3.0)

## 2024-06-26 ENCOUNTER — Ambulatory Visit (HOSPITAL_COMMUNITY)
Admission: RE | Admit: 2024-06-26 | Discharge: 2024-06-26 | Disposition: A | Discharge status: Home / Self Care | Source: Ambulatory Visit | Attending: Cardiology | Admitting: Cardiology

## 2024-06-26 ENCOUNTER — Ambulatory Visit (HOSPITAL_COMMUNITY): Payer: Self-pay | Admitting: Pharmacist

## 2024-06-26 ENCOUNTER — Telehealth: Payer: Self-pay | Admitting: Pharmacist

## 2024-06-26 ENCOUNTER — Ambulatory Visit (HOSPITAL_COMMUNITY): Payer: Self-pay | Admitting: Cardiology

## 2024-06-26 DIAGNOSIS — Z79890 Hormone replacement therapy (postmenopausal): Secondary | ICD-10-CM | POA: Insufficient documentation

## 2024-06-26 DIAGNOSIS — Z87891 Personal history of nicotine dependence: Secondary | ICD-10-CM | POA: Insufficient documentation

## 2024-06-26 DIAGNOSIS — E1122 Type 2 diabetes mellitus with diabetic chronic kidney disease: Secondary | ICD-10-CM | POA: Diagnosis not present

## 2024-06-26 DIAGNOSIS — I4891 Unspecified atrial fibrillation: Secondary | ICD-10-CM | POA: Diagnosis not present

## 2024-06-26 DIAGNOSIS — J449 Chronic obstructive pulmonary disease, unspecified: Secondary | ICD-10-CM | POA: Diagnosis not present

## 2024-06-26 DIAGNOSIS — Z7901 Long term (current) use of anticoagulants: Secondary | ICD-10-CM | POA: Diagnosis not present

## 2024-06-26 DIAGNOSIS — N183 Chronic kidney disease, stage 3 unspecified: Secondary | ICD-10-CM | POA: Insufficient documentation

## 2024-06-26 DIAGNOSIS — Z7682 Awaiting organ transplant status: Secondary | ICD-10-CM | POA: Insufficient documentation

## 2024-06-26 DIAGNOSIS — I428 Other cardiomyopathies: Secondary | ICD-10-CM | POA: Insufficient documentation

## 2024-06-26 DIAGNOSIS — D649 Anemia, unspecified: Secondary | ICD-10-CM | POA: Diagnosis not present

## 2024-06-26 DIAGNOSIS — R0600 Dyspnea, unspecified: Secondary | ICD-10-CM | POA: Diagnosis not present

## 2024-06-26 DIAGNOSIS — I4892 Unspecified atrial flutter: Secondary | ICD-10-CM | POA: Insufficient documentation

## 2024-06-26 DIAGNOSIS — I5022 Chronic systolic (congestive) heart failure: Secondary | ICD-10-CM | POA: Diagnosis present

## 2024-06-26 DIAGNOSIS — Z7984 Long term (current) use of oral hypoglycemic drugs: Secondary | ICD-10-CM | POA: Diagnosis not present

## 2024-06-26 DIAGNOSIS — Z794 Long term (current) use of insulin: Secondary | ICD-10-CM | POA: Insufficient documentation

## 2024-06-26 DIAGNOSIS — E039 Hypothyroidism, unspecified: Secondary | ICD-10-CM | POA: Diagnosis not present

## 2024-06-26 DIAGNOSIS — Z95811 Presence of heart assist device: Secondary | ICD-10-CM | POA: Insufficient documentation

## 2024-06-26 DIAGNOSIS — Z79899 Other long term (current) drug therapy: Secondary | ICD-10-CM | POA: Diagnosis not present

## 2024-06-26 LAB — COMPREHENSIVE METABOLIC PANEL WITH GFR
ALT: 27 U/L (ref 0–44)
AST: 28 U/L (ref 15–41)
Albumin: 3.7 g/dL (ref 3.5–5.0)
Alkaline Phosphatase: 143 U/L — ABNORMAL HIGH (ref 38–126)
Anion gap: 10 (ref 5–15)
BUN: 20 mg/dL (ref 8–23)
CO2: 28 mmol/L (ref 22–32)
Calcium: 8.8 mg/dL — ABNORMAL LOW (ref 8.9–10.3)
Chloride: 98 mmol/L (ref 98–111)
Creatinine, Ser: 1.25 mg/dL — ABNORMAL HIGH (ref 0.61–1.24)
GFR, Estimated: >60 mL/min (ref >60)
Glucose, Bld: 111 mg/dL — ABNORMAL HIGH (ref 70–99)
Potassium: 3.7 mmol/L (ref 3.5–5.1)
Sodium: 136 mmol/L (ref 135–145)
Total Bilirubin: 0.9 mg/dL (ref 0.0–1.2)
Total Protein: 7.6 g/dL (ref 6.5–8.1)

## 2024-06-26 LAB — CBC
HCT: 52.9 % — ABNORMAL HIGH (ref 39.0–52.0)
Hemoglobin: 17.1 g/dL — ABNORMAL HIGH (ref 13.0–17.0)
MCH: 27.8 pg (ref 26.0–34.0)
MCHC: 32.3 g/dL (ref 30.0–36.0)
MCV: 85.9 fL (ref 80.0–100.0)
Platelets: 280 K/uL (ref 150–400)
RBC: 6.16 MIL/uL — ABNORMAL HIGH (ref 4.22–5.81)
RDW: 18.1 % — ABNORMAL HIGH (ref 11.5–15.5)
WBC: 6 K/uL (ref 4.0–10.5)
nRBC: 0 % (ref 0.0–0.2)

## 2024-06-26 LAB — DIGOXIN LEVEL: Digoxin Level: <0.6 ng/mL — ABNORMAL LOW (ref 0.8–2.0)

## 2024-06-26 LAB — PROTIME-INR
INR: 1.4 — ABNORMAL HIGH (ref 0.8–1.2)
Prothrombin Time: 18.2 s — ABNORMAL HIGH (ref 11.4–15.2)

## 2024-06-26 LAB — PREALBUMIN: Prealbumin: 22 mg/dL (ref 18–38)

## 2024-06-26 LAB — LACTATE DEHYDROGENASE: LDH: 221 U/L — ABNORMAL HIGH (ref 98–192)

## 2024-06-26 LAB — MAGNESIUM: Magnesium: 2.3 mg/dL (ref 1.7–2.4)

## 2024-06-26 NOTE — Patient Instructions (Signed)
 No change in medications  Return to clinic in 2 months

## 2024-06-26 NOTE — Telephone Encounter (Signed)
 Called patient x 2 to discuss warfarin plan with no response. INR in clinic today was 1.4 compared to home INR of 2. Patient will have to have home INR machine re-calibrated. Patient instructed to return call to discuss further.

## 2024-06-26 NOTE — Progress Notes (Signed)
 Pt presents for 2 month f/u w/ 4 yr [employer] and intermacs in VAD clinic today alone. Denies issues with VAD equipment or drive line.   Pt walked into clinic independently. States he is overall feeling well. Denies lightheadedness, dizziness, falls, shortness of breath, and signs of bleeding. Reports he has been juicing, and taking ginger. Occasionally having diarrhea, which he attributes to juicing. Encouraged to drink at least 2L per day- he verbalized understanding.   Pt is working for a it consultant. He tells me that he is picking up extra hours.  Wearing Freestyle Libre. Taking all medications as prescribed.   Listed for transplant Status 4 at [facility].    Vital Signs:   Doppler: 94     Automatic BP: 96/68 (79)   HR: 80 NSR     SpO2: 95% on RA  Weight: 208.8 lb w/ equip Last weight: 204.4 lb w/ equip  Destination Therapy due to uncontrolled diabetes   LVAD assessment: HM III:  VAD Speed: 5700 rpms Flow: 4.9     Power: 4.6 w       PI: 2.9     Hct: 53   Alarms: none Events: 5 - 10 daily  Fixed speed: 5700 Low speed limit: 5300  Primary controller: back up battery due for replacement in 27 months Secondary controller: back up battery due for replacement in 20 months   I reviewed the LVAD parameters from today and compared the results to the patient's prior recorded data. LVAD interrogation was NEGATIVE for significant power changes, NEGATIVE for clinical alarms and STABLE for PI events/speed drops. No programming changes were made and pump is functioning within specified parameters. Pt is performing daily controller and system monitor self tests along with completing weekly and monthly maintenance for LVAD equipment.   LVAD equipment check completed and is in good working order. Back-up equipment not present today.   Annual Equipment Maintenance on UBC/PM was performed on 06/26/2024.   Exit Site Care: Dressing saturated underneath from recent shower. Existing VAD dressing  removed and site care performed using sterile technique. Drive line exit site cleaned with Chlora prep applicators x 2, allowed to dry, and Sorbaview dressing with Silverlon patch applied. Exit site healed and incorporated, the velour is fully implanted at exit site. No redness, tenderness, drainage, foul odor or rash noted. Drive line anchor re-applied. 2 large tegaderms placed over dressing. Pt given 8 weekly kits for home use.  BP & Labs:  Doppler 94 - Doppler is reflecting modified systolic   Hgb 17.1- No S/S of bleeding. Specifically denies melena/BRBPR or nosebleeds.   LDH stable at 221 - Denies tea-colored urine. No power elevations noted on interrogation.    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    Backup system controller 11 volt battery charged during visit.   2 year Intermacs follow up completed including:  Quality of Life, [name], and Neurocognitive trail making.   Pt completed 1160 feet during 6 minute walk.  [place] Cardiomyopathy Questionnaire     06/26/2024   10:59 AM 12/26/2023    2:59 PM 07/09/2023   11:23 AM  [name]  1 a. Ability to shower/bathe Not at all limited Not at all limited Not at all limited  1 b. Ability to walk 1 block Not at all limited Not at all  limited Not at all limited  1 c. Ability to hurry/jog  Moderately limited Extremely limited  2. Edema feet/ankles/legs Less than once a week Never over the past 2 weeks Never over the past 2 weeks  3. Limited by fatigue Never over the past 2 weeks Never over the past 2 weeks Never over the past 2 weeks  4. Limited by dyspnea Never over the past 2 weeks Never over the past 2 weeks Never over the past 2 weeks  5. Sitting up / on 3+ pillows Never over the past 2 weeks Never over the past 2  weeks Never over the past 2 weeks  6. Limited enjoyment of life Slightly limited Not limited at all Slightly limited  7. Rest of life w/ symptoms Completely satisfied Somewhat satisfied Completely satisfied  8 a. Participation in hobbies Did not limit at all Limited quite a bit Moderately limited  8 b. Participation in chores Did not limit at all Did not limit at all Did not limit at all  8 c. Visiting family/friends Slightly limited Severely limited Slightly limited      Patient Goals: Get a heart transplant  Patient Instructions: No medication changes Coumadin  dosing per Lauren PharmD Return to clinic in 2 months.   Lauraine Ip RN VAD Coordinator  Office: [phone]  24/7 Pager: [phone]

## 2024-06-26 NOTE — Progress Notes (Signed)
 ADVANCED HF/VAD CLINIC NOTE  Chief complaint: CHF  [name] is a 63 y.o. with history of atrial fibrillation/flutter and nonischemic cardiomyopathy who was initially referred by Dr. Alvan for evaluation of CHF.  Patient was diagnosed with CHF in 2020 at [facility] in [place].  At the time, he drank heavily and used cocaine.  He stopped drinking, smoking, and cocaine in 2020 and has not used any of these substances since then. Echo in 11/20 showed EF < 10%, cath in 12/20 showed normal coronaries.  Echo has shown persistently low EF over time, echo in 2/23 showed EF 20-25%.  He has a Medtronic ICD.      Patient was admitted in 2/23 with atrial flutter/RVR. He was started on amiodarone  and converted to NSR.  He had atrial flutter ablation in 5/23 and amiodarone  was stopped  Echo was done in 11/23 showing elevated filling pressure and markedly low cardiac output.  Patient was admitted, started on milrinone , and underwent HM3 LVAD placement with LAA clipping later in 11/23.  Post-op course complicated by acute hypoxemic respiratory failure post-op and PNA.  Patient went to CIR after acute hospital course.   Ramp echo was done in 4/24, at 5500 bpm, the IV septum was shifted mildly to the left with moderate RV dilation and dysfunction, AoV opened rarely.  Speed decreased to 5400 bpm, slight opening AoV every beat, trivial AI, midline IV septum.  Speed left at 5400 bpm.   Ramp echo was done in 2/25.  Speed started at 5400 rpm and was increased to 5600 rpm.  Flow increased from 4.9 L/min to 5.2 L/min.  The aortic valve opened every beat still at 5600 rpm but the septum started out rightward and ended up midline at 5600 rpm.  Speed was left at 5600 rpm. There was moderate RV dysfunction, IVC normal.   Patient had RHC in 3/25 with low output by thermodilution, preserved by Fick. LVAD speed increased to 5700 rpm.   Ramp echo was done in 5/25, slight aortic valve opening with each beat, trivial AI, RV  moderately dilated and dysfunctional. Speed was left at 5700 rpm.   Patient returns for LVAD followup.  He is [facility] for transplant at [facility].  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  No significant exertional dyspnea, no orthopnea/PND.  No lightheadedness.  Weight up about 4 lbs.  BP controlled.   Labs (7/24): K 4, creatinine 1.39, hgb 12.3, LDH 219 Labs (10/24): K 4.6, creatinine 1.25, hgb 12.1, LDH 183 Labs (12/24): K 4.3, creatinine 1.2, hgb 13.2 Labs (2/25): K 3.8, creatinine 1.18, hgb 14.2 => 13.2 Labs (5/25): K 3.8, creatinine 1.33, hgb 15.3, LDH 265 Labs (8/25): hgb 16.4, K 4.3, creatinine 1.25, LFTs normal, ferritin 21 with transferrin saturation 11% Labs (9/25): K 4.6, creatinine 1.39  PMH: 1. Atrial fibrillation/flutter: Mainly flutter.  - 5/23 Atrial flutter ablation.  2. H/o DVT 3. Chronic systolic CHF: Nonischemic cardiomyopathy.  Medtronic ICD.  - Echo (11/20): EF < 10% - Cath (12/20): Normal coronaries.  - Echo (8/22): EF 30%, mildly decreased RV systolic function.  - Echo (2/23): EF 20-25%, normal RV.  - RHC (11/23): mean RA 14, PA 66/53 mean 54, mean PCWP 43, CI 1.62 F/1.31 T - HM3 LVAD with LAA clipping on 07/04/22.  - Ramp echo (2/24): Speed increased to 5500 rpm.  At this speed, IV septum was midline.  Aortic valve opened 1/3 beats.  Mild RV enlargement with moderate RV dysfunction.  -  Ramp echo (3/24): Speed decreased back to 5400 rpm as the IV septum was shifted left.  Midline septum at 5400 rpm.  - Ramp echo (2/25): Speed increased to 5600 rpm with IV septum midline at 5600.  Moderate RV dysfunction.  - RHC (3/25): mean RA 8, PA 29/10, mean PCWP 8, CI 2.38 F/1.63 T.  - Ramp echo (5/25): Moderately dilated and dysfunctional RV.  Speed left at 5700 rpm.  4. Prior cocaine abuse.  5. Prior heavy ETOH 6. COPD: Moderate obstruction on 11/23 PFTs. No longer smokes.  7. Type 2 diabetes   SH: Patient was a heavy drinker, abused cocaine, and smoked in  the past but quit in 2020.  Lives in [place]. He does part-time custodial work.    FH: Mother with PPM, aunt with CHF.    VAD Indication: Destination Therapy due to uncontrolled diabetes   LVAD assessment: HM III: LVAD Documentation    06/26/2024  Device Info  LVAD Type: Heartmate III  Date of Implant: 07/04/2022  Therapy Type: Destination Therapy      06/26/2024  Vitals  Heart Rate: 80 BPM  Automatic BP: 96/68  Doppler MAP: 94 mmHg  SpO2: 95 %    Last 3 Weights Weight Weight  06/26/2024 94.711 kg 208 lb 12.8 oz  05/07/2024 93.486 kg 206 lb 1.6 oz  04/29/2024 92.715 kg 204 lb 6.4 oz       06/26/2024  LVAD Paramaters  Speed: 5700 RPM  Flow: 5 LPM  PI: 3  Power: 5 Watts  Hematocrit: 53 %  Alarms: none  Events: 5-10  Last Speed Change Date: 12/26/2023  Last Ramp Echo Date: 12/26/2023  Last Right Heart Cath Date: 06/29/2022  Bleeding History: Yes  Type of Bleeding Hx: Epistaxis  Type of Dressing: Weekly  Annual Maintenance Date: 06/29/2023    Labs    Units 06/26/24 0909 06/25/24 0000 06/16/24 0000 05/05/24 0000 04/29/24 1100 04/07/24 0000 04/03/24 1324 03/06/24 1104 02/25/24 1110  INR  1.4* 2.0 1.9*   < > 1.4*   < >  --    < > 2.0*  LDH U/L 221*  --   --   --  287*  --   --   --  228*  HGB g/dL 82.8*  --   --   --  82.5*  --  16.4  --  15.8  CREATININE mg/dL 8.74*  --   --   --  8.60*  --  1.25*   < > 1.09   < > = values in this interval not displayed.          ROS: All systems negative except as listed in HPI, PMH and Problem List.  Current Outpatient Medications  Medication Sig Dispense Refill   acetaminophen  (TYLENOL ) 325 MG tablet Take 1-2 tablets (325-650 mg total) by mouth every 4 (four) hours as needed for mild pain.     atorvastatin  (LIPITOR) 40 MG tablet Take 40 mg by mouth daily.     Calcium  Carb-Cholecalciferol  600-10 MG-MCG TABS Take 1 tablet by mouth daily with breakfast. 90 tablet 3   Continuous Glucose Receiver (FREESTYLE LIBRE 3 READER) DEVI  1 each by Does not apply route daily. 2 each 3   Continuous Glucose Sensor (FREESTYLE LIBRE 3 SENSOR) MISC PLACE 1 SENSOR ON THE SKIN EVERY 14 DAYS 2 each 3   digoxin  (LANOXIN ) 0.125 MG tablet Take 0.5 tablets (0.0625 mg total) by mouth daily. 30 tablet 5   empagliflozin  (JARDIANCE ) 25 MG TABS  tablet Take 1 tablet (25 mg total) by mouth daily. 90 tablet 1   insulin  glargine (LANTUS  SOLOSTAR) 100 UNIT/ML Solostar Pen Inject 40 Units into the skin at bedtime. 45 mL 1   insulin  lispro (HUMALOG ) 100 UNIT/ML KwikPen Use only when pre-meal glucose is above 150 30 mL 0   Insulin  Pen Needle (DROPLET PEN NEEDLES) 32G X 4 MM MISC USE TO INJECT 4 TIMES A DAY 100 each 1   levothyroxine  (SYNTHROID ) 25 MCG tablet Take 1 tablet (25 mcg total) by mouth daily before breakfast. 90 tablet 3   magnesium  oxide (MAG-OX) 400 (240 Mg) MG tablet TAKE 1 TABLET(400 MG) BY MOUTH TWICE DAILY 180 tablet 3   Multiple Vitamin (MULTIVITAMIN WITH MINERALS) TABS tablet Take 1 tablet by mouth daily.     pantoprazole  (PROTONIX ) 40 MG tablet Take 1 tablet (40 mg total) by mouth daily. 90 tablet 3   potassium chloride  SA (KLOR-CON  M) 20 MEQ tablet Take 2 tablets (40 mEq total) by mouth daily. 180 tablet 3   sacubitril -valsartan  (ENTRESTO ) 24-26 MG Take 1 tablet by mouth 2 (two) times daily. 60 tablet 3   sertraline  (ZOLOFT ) 50 MG tablet TAKE 1 TABLET(50 MG) BY MOUTH DAILY 90 tablet 3   sildenafil  (REVATIO ) 20 MG tablet TAKE 1 TABLET(20 MG) BY MOUTH THREE TIMES DAILY 90 tablet 6   SPIRIVA  HANDIHALER 18 MCG inhalation capsule PLACE 1 CAPSULE INTO INHALER AND INHALE EVERY DAY 30 capsule 12   spironolactone  (ALDACTONE ) 25 MG tablet Take 1 tablet (25 mg total) by mouth at bedtime. 90 tablet 3   torsemide  (DEMADEX ) 20 MG tablet Take 3 tablets (60 mg total) by mouth 2 (two) times daily. 180 tablet 10   warfarin (COUMADIN ) 3 MG tablet TAKE 2 TABLETS BY MOUTH EVERY TUESDAY, THURSDAYS AND SATURDAY AND 1 TABLET ALL OTHER DAYS 120 tablet 6    enoxaparin  (LOVENOX ) 40 MG/0.4ML injection Inject 0.4 mLs (40 mg total) into the skin every 12 (twelve) hours. (Patient not taking: Reported on 06/26/2024) 4 mL 0   Fe Fum-Vit C-Vit B12-FA (TRIGELS-F FORTE) CAPS capsule Take 1 capsule by mouth daily after breakfast. (Patient not taking: Reported on 06/26/2024) 30 capsule 6   No current facility-administered medications for this encounter.    Patient has no known allergies.  Vital Signs: BP (!) 94/0   Pulse 80   Ht 5' 11 (1.803 m)   Wt 94.7 kg (208 lb 12.8 oz)   SpO2 95%   BMI 29.12 kg/m  MAP 79  Physical Exam: General: Well appearing this am. NAD.  HEENT: Normal. Neck: Supple, JVP 7-8 cm. Carotids OK.  Cardiac:  Mechanical heart sounds with LVAD hum present.  Lungs:  CTAB, normal effort.  Abdomen:  NT, ND, no HSM. No bruits or masses. +BS  LVAD exit site: Well-healed and incorporated. Dressing dry and intact. No erythema or drainage. Stabilization device present and accurately applied. Driveline dressing changed daily per sterile technique. Extremities:  Warm and dry. No cyanosis, clubbing, rash, or edema.  Neuro:  Alert & oriented x 3. Cranial nerves grossly intact. Moves all 4 extremities w/o difficulty. Affect pleasant    ASSESSMENT AND PLAN:   1. Chronic systolic CHF: Nonischemic cardiomyopathy, diagnosed 2020.  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  Cath in 12/20 with no significant coronary disease.  Medtronic ICD. Echo in 2/23 showed EF 20-25% with normal RV.  RHC in 11/13 showed markedly elevated filling pressures, primarily pulmonary venous hypertension, low cardiac output, and low PAPI. Echo in 11/23 with EF <20%, mod RV dysfunction.  He was admitted and started on milrinone  with improvement in hemodynamics.  We proceeded with HM3 LVAD with LAA  clip on 07/04/22.  Ramp echo was done in 2/25, speed increased to 5600 rpm.  RHC in 3/25, speed increased to 5700 rpm.  Ramp echo in 5/25 showed moderate RV dilation/moderate RV dysfunction, speed left at 5700 rpm.  LVAD parameters reviewed today and stable.  NYHA class I, not volume overloaded on exam.   - Continue torsemide  60 mg bid, will not change dose.  BMET today.  - Continue Entresto  24/26 bid.  - Continue Jardiance  10 mg daily.  - Continue sildenafil  20 tid for RV.  - Continue spironolactone  25 mg daily.   - Continue digoxin  0.0625 daily, check level today.  - Off beta blocker with RV dysfunction.  - He is now on the transplant list at [facility], UNOS status 4.  I worry about him [facility] with an LVAD given development of RV failure.   2. Atrial fibrillation/atrial flutter: H/o flutter ablation.  Had AF/RVR post-LVAD, went back into NSR.  He is now off amiodarone .  - Continue warfarin.  3. VAD management: s/p HM-3 VAD on 07/04/22. Ramp echo in 4/24, speed decreased to 5400 rpm.  Stable LVAD parameters.  Ramp echo in 5/25 with speed maintained at 5700.  - Now off ASA.  - Continue warfarin, INR goal 2-2.5. 4. H/o DVT: On anticoagulation.  5. CKD stage 3: On Jardiance .  - BMET today.  6. Hypothyroidism - On levothyroxine .  7. Anemia:   - CBC today.  8. COPD: Moderate on 11/23 PFTs.  Suspect this plays a role in his dyspnea.  - Continue Spiriva .     9. Diabetes: Endocrinology followup.                                                                                                                                                                                                             Followup in 2 months.   I spent 41 minutes reviewing data and LVAD parameters, interviewing patient, and organizing the orders/followup.   [facility] 06/26/2024

## 2024-06-27 ENCOUNTER — Other Ambulatory Visit (HOSPITAL_COMMUNITY): Payer: Self-pay | Admitting: *Deleted

## 2024-06-27 ENCOUNTER — Telehealth (HOSPITAL_COMMUNITY): Payer: Self-pay | Admitting: Pharmacist

## 2024-06-27 DIAGNOSIS — Z7901 Long term (current) use of anticoagulants: Secondary | ICD-10-CM

## 2024-06-27 DIAGNOSIS — Z95811 Presence of heart assist device: Secondary | ICD-10-CM

## 2024-06-27 NOTE — Telephone Encounter (Signed)
 [facility] spoke with INR company. They require him to check a lab INR and home INR within 15 minutes of one another before they will send a new machine. Appointment scheduled for Tuesday and patient will bring home INR machine as well.

## 2024-06-30 NOTE — Telephone Encounter (Signed)
 Patient checked INR today off schedule, resulting at 2.2. Patient was reminded to get INR drawn at [facility] tomorrow, then check home INR machine within 15 minutes so that the machine can be re-calibrated. Will not make any changes based on home INR today.

## 2024-07-01 ENCOUNTER — Other Ambulatory Visit (HOSPITAL_COMMUNITY)
Admission: RE | Admit: 2024-07-01 | Discharge: 2024-07-01 | Disposition: A | Discharge status: Home / Self Care | Source: Ambulatory Visit | Attending: Cardiology | Admitting: Cardiology

## 2024-07-01 ENCOUNTER — Ambulatory Visit (HOSPITAL_COMMUNITY)

## 2024-07-01 ENCOUNTER — Ambulatory Visit (HOSPITAL_COMMUNITY): Payer: Self-pay | Admitting: Pharmacist

## 2024-07-01 ENCOUNTER — Ambulatory Visit (HOSPITAL_COMMUNITY): Payer: Self-pay | Admitting: Cardiology

## 2024-07-01 DIAGNOSIS — Z95811 Presence of heart assist device: Secondary | ICD-10-CM | POA: Insufficient documentation

## 2024-07-01 DIAGNOSIS — Z7901 Long term (current) use of anticoagulants: Secondary | ICD-10-CM | POA: Insufficient documentation

## 2024-07-01 LAB — PROTIME-INR
INR: 1.7 — ABNORMAL HIGH (ref 0.8–1.2)
Prothrombin Time: 20.9 s — ABNORMAL HIGH (ref 11.4–15.2)

## 2024-07-03 ENCOUNTER — Telehealth (HOSPITAL_COMMUNITY): Payer: Self-pay | Admitting: Pharmacist

## 2024-07-03 ENCOUNTER — Other Ambulatory Visit (HOSPITAL_COMMUNITY): Payer: Self-pay | Admitting: Unknown Physician Specialty

## 2024-07-03 MED ORDER — SACUBITRIL-VALSARTAN 24-26 MG PO TABS: 1.0000 | ORAL_TABLET | Freq: Two times a day (BID) | ORAL | 11 refills | Status: AC

## 2024-07-03 NOTE — Telephone Encounter (Signed)
 Discussed with the home INR company for 30 minutes the necessity for more accurate home INR results. There was a 0.5 difference between finger stick and lab draw which spans the entirety of the patient's goal range. Despite a proliferation of clinical rationale for why this difference is not acceptable, the company refuses to send a new meter until the difference is greater than 0.5, despite one this week being therapeutic and one being subtherapeutic.

## 2024-07-04 ENCOUNTER — Other Ambulatory Visit (HOSPITAL_COMMUNITY): Payer: Self-pay | Admitting: *Deleted

## 2024-07-04 ENCOUNTER — Other Ambulatory Visit (HOSPITAL_COMMUNITY): Payer: Self-pay

## 2024-07-04 DIAGNOSIS — Z7901 Long term (current) use of anticoagulants: Secondary | ICD-10-CM

## 2024-07-04 DIAGNOSIS — Z95811 Presence of heart assist device: Secondary | ICD-10-CM

## 2024-07-04 DIAGNOSIS — I509 Heart failure, unspecified: Secondary | ICD-10-CM

## 2024-07-04 MED ORDER — SILDENAFIL CITRATE 20 MG PO TABS: 20.0000 mg | ORAL_TABLET | Freq: Three times a day (TID) | ORAL | 6 refills | Status: AC

## 2024-07-07 ENCOUNTER — Ambulatory Visit (HOSPITAL_COMMUNITY): Payer: Self-pay | Admitting: Pharmacist

## 2024-07-07 LAB — POCT INR: INR: 3.2 — AB (ref 2.0–3.0)

## 2024-07-08 ENCOUNTER — Other Ambulatory Visit: Payer: Self-pay | Admitting: *Deleted

## 2024-07-08 ENCOUNTER — Other Ambulatory Visit (HOSPITAL_COMMUNITY)
Admission: RE | Admit: 2024-07-08 | Discharge: 2024-07-08 | Disposition: A | Discharge status: Home / Self Care | Source: Ambulatory Visit | Attending: Cardiology | Admitting: Cardiology

## 2024-07-08 ENCOUNTER — Telehealth: Payer: Self-pay | Admitting: "Endocrinology

## 2024-07-08 ENCOUNTER — Other Ambulatory Visit (HOSPITAL_COMMUNITY): Payer: Self-pay | Admitting: Unknown Physician Specialty

## 2024-07-08 ENCOUNTER — Ambulatory Visit (HOSPITAL_COMMUNITY): Payer: Self-pay | Admitting: Pharmacist

## 2024-07-08 DIAGNOSIS — Z4509 Encounter for adjustment and management of other cardiac device: Secondary | ICD-10-CM | POA: Insufficient documentation

## 2024-07-08 DIAGNOSIS — Z95811 Presence of heart assist device: Secondary | ICD-10-CM | POA: Insufficient documentation

## 2024-07-08 DIAGNOSIS — I509 Heart failure, unspecified: Secondary | ICD-10-CM

## 2024-07-08 DIAGNOSIS — Z7901 Long term (current) use of anticoagulants: Secondary | ICD-10-CM | POA: Diagnosis present

## 2024-07-08 DIAGNOSIS — I10 Essential (primary) hypertension: Secondary | ICD-10-CM

## 2024-07-08 DIAGNOSIS — E1169 Type 2 diabetes mellitus with other specified complication: Secondary | ICD-10-CM

## 2024-07-08 DIAGNOSIS — Z794 Long term (current) use of insulin: Secondary | ICD-10-CM

## 2024-07-08 DIAGNOSIS — E782 Mixed hyperlipidemia: Secondary | ICD-10-CM

## 2024-07-08 DIAGNOSIS — E119 Type 2 diabetes mellitus without complications: Secondary | ICD-10-CM

## 2024-07-08 LAB — PROTIME-INR
INR: 2.2 — ABNORMAL HIGH (ref 0.8–1.2)
Prothrombin Time: 25.5 s — ABNORMAL HIGH (ref 11.4–15.2)

## 2024-07-08 MED ORDER — FREESTYLE LIBRE 3 SENSOR MISC: 3 refills | Status: AC

## 2024-07-08 NOTE — Telephone Encounter (Signed)
 Pt needs libre 3 plus sensors rx sent to [facility] on [address]

## 2024-07-08 NOTE — Telephone Encounter (Signed)
 Refill has been sent in to the [facility] in [place] on [address].

## 2024-07-14 ENCOUNTER — Ambulatory Visit (HOSPITAL_COMMUNITY)
Admission: RE | Admit: 2024-07-14 | Discharge: 2024-07-14 | Disposition: A | Discharge status: Home / Self Care | Source: Ambulatory Visit | Attending: Oncology | Admitting: Oncology

## 2024-07-14 ENCOUNTER — Inpatient Hospital Stay: Attending: Oncology

## 2024-07-14 ENCOUNTER — Ambulatory Visit (HOSPITAL_COMMUNITY): Payer: Self-pay | Admitting: Pharmacist

## 2024-07-14 DIAGNOSIS — D751 Secondary polycythemia: Secondary | ICD-10-CM | POA: Insufficient documentation

## 2024-07-14 DIAGNOSIS — I509 Heart failure, unspecified: Secondary | ICD-10-CM | POA: Diagnosis not present

## 2024-07-14 DIAGNOSIS — R7989 Other specified abnormal findings of blood chemistry: Secondary | ICD-10-CM | POA: Insufficient documentation

## 2024-07-14 DIAGNOSIS — D508 Other iron deficiency anemias: Secondary | ICD-10-CM

## 2024-07-14 LAB — CBC WITH DIFFERENTIAL/PLATELET
Abs Immature Granulocytes: 0.03 K/uL (ref 0.00–0.07)
Basophils Absolute: 0.1 K/uL (ref 0.0–0.1)
Basophils Relative: 1 %
Eosinophils Absolute: 0.2 K/uL (ref 0.0–0.5)
Eosinophils Relative: 3 %
HCT: 53.8 % — ABNORMAL HIGH (ref 39.0–52.0)
Hemoglobin: 17.6 g/dL — ABNORMAL HIGH (ref 13.0–17.0)
Immature Granulocytes: 0 %
Lymphocytes Relative: 14 %
Lymphs Abs: 1 K/uL (ref 0.7–4.0)
MCH: 28.3 pg (ref 26.0–34.0)
MCHC: 32.7 g/dL (ref 30.0–36.0)
MCV: 86.4 fL (ref 80.0–100.0)
Monocytes Absolute: 0.6 K/uL (ref 0.1–1.0)
Monocytes Relative: 9 %
Neutro Abs: 5.3 K/uL (ref 1.7–7.7)
Neutrophils Relative %: 73 %
Platelets: 274 K/uL (ref 150–400)
RBC: 6.23 MIL/uL — ABNORMAL HIGH (ref 4.22–5.81)
RDW: 17.5 % — ABNORMAL HIGH (ref 11.5–15.5)
WBC: 7.2 K/uL (ref 4.0–10.5)
nRBC: 0 % (ref 0.0–0.2)

## 2024-07-14 LAB — IRON AND TIBC
Iron: 52 ug/dL (ref 45–182)
Saturation Ratios: 20 % (ref 17.9–39.5)
TIBC: 262 ug/dL (ref 250–450)
UIBC: 210 ug/dL

## 2024-07-14 LAB — COMPREHENSIVE METABOLIC PANEL WITH GFR
ALT: 30 U/L (ref 0–44)
AST: 26 U/L (ref 15–41)
Albumin: 4.3 g/dL (ref 3.5–5.0)
Alkaline Phosphatase: 159 U/L — ABNORMAL HIGH (ref 38–126)
Anion gap: 8 (ref 5–15)
BUN: 27 mg/dL — ABNORMAL HIGH (ref 8–23)
CO2: 32 mmol/L (ref 22–32)
Calcium: 9.6 mg/dL (ref 8.9–10.3)
Chloride: 98 mmol/L (ref 98–111)
Creatinine, Ser: 1.21 mg/dL (ref 0.61–1.24)
GFR, Estimated: >60 mL/min (ref >60)
Glucose, Bld: 160 mg/dL — ABNORMAL HIGH (ref 70–99)
Potassium: 4.1 mmol/L (ref 3.5–5.1)
Sodium: 139 mmol/L (ref 135–145)
Total Bilirubin: 0.5 mg/dL (ref 0.0–1.2)
Total Protein: 8 g/dL (ref 6.5–8.1)

## 2024-07-14 LAB — VITAMIN B12: Vitamin B-12: 1064 pg/mL — ABNORMAL HIGH (ref 180–914)

## 2024-07-14 LAB — FOLATE: Folate: >20 ng/mL (ref >5.9)

## 2024-07-14 LAB — FERRITIN: Ferritin: 320 ng/mL (ref 24–336)

## 2024-07-14 LAB — POCT INR: INR: 1.5 — AB (ref 2.0–3.0)

## 2024-07-21 ENCOUNTER — Ambulatory Visit (HOSPITAL_COMMUNITY): Payer: Self-pay | Admitting: Pharmacist

## 2024-07-21 LAB — POCT INR: INR: 2.2 (ref 2.0–3.0)

## 2024-07-24 ENCOUNTER — Inpatient Hospital Stay: Attending: Oncology | Admitting: Oncology

## 2024-07-24 ENCOUNTER — Inpatient Hospital Stay: Admitting: Oncology

## 2024-07-24 VITALS — BP 109/87 | HR 70 | Temp 97.4°F | Resp 18 | Wt 213.0 lb

## 2024-07-24 DIAGNOSIS — E611 Iron deficiency: Secondary | ICD-10-CM

## 2024-07-24 DIAGNOSIS — D508 Other iron deficiency anemias: Secondary | ICD-10-CM

## 2024-07-24 DIAGNOSIS — D751 Secondary polycythemia: Secondary | ICD-10-CM | POA: Insufficient documentation

## 2024-07-24 DIAGNOSIS — I509 Heart failure, unspecified: Secondary | ICD-10-CM | POA: Insufficient documentation

## 2024-07-24 NOTE — Assessment & Plan Note (Signed)
 The most likely cause of his anemia is due to chronic blood loss or malabsorption syndrome. Lab Results  Component Value Date   IRON 52 07/14/2024   TIBC 262 07/14/2024   FERRITIN 320 07/14/2024  TSAT: 11  Last colonoscopy/endoscopy: 2016 with colon polyps   -We discussed some of the risks, benefits, and alternatives of intravenous iron infusions. The patient is symptomatic from anemia and the iron level is critically low. He tolerated oral iron supplement poorly and desires to achieve higher levels of iron faster for adequate hematopoesis. Some of the side-effects to be expected including risks of infusion reactions, phlebitis, headaches, nausea and fatigue.  The patient is willing to proceed. Patient education material was dispensed. Goal is to keep ferritin level greater than 50 and resolution of anemia -Start oral iron every other day.  Use MiraLAX  for constipation - Will refer to GI for further workup for iron deficiency  Return to clinic in 12 weeks with labs to assess response to IV iron

## 2024-07-24 NOTE — Assessment & Plan Note (Addendum)
 Patient has elevated red blood cell mass and hemoglobin most recently from 07/14/2024 which shows hemoglobin 17.6 and RBC 6.23.  Hematocrit 53.8.  RDW 17.5. 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 Most recently, patient received 2 doses of IV Feraheme on 04/15/2024 and 04/24/2024 with good tolerance. Erythropoietin  level was 21.2 which is considered elevated likely secondary to secondary causes. Patient had ultrasound of his abdomen which showed mild increased echogenicity of the renal cortices without significant thinning consistent with chronic medical renal disease.  No significant abnormality of the liver or spleen.  -Continue to monitor for now.  Return to clinic in 3 months with labs

## 2024-07-24 NOTE — Progress Notes (Signed)
 [facility]  HEMATOLOGY FOLLOW-UP VISIT  Vick Lurie, FNP (Inactive)  REASON FOR FOLLOW-UP: Polycythemia  ASSESSMENT & PLAN:  Patient is a 63 y.o. male following for ploycythemia  Assessment & Plan Erythrocytosis Patient has elevated red blood cell mass and hemoglobin most recently from 07/14/2024 which shows hemoglobin 17.6 and RBC 6.23.  Hematocrit 53.8.  RDW 17.5. 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 Most recently, patient received 2 doses of IV Feraheme on 04/15/2024 and 04/24/2024 with good tolerance. Erythropoietin  level was 21.2 which is considered elevated likely secondary to secondary causes. Patient had ultrasound of his abdomen which showed mild increased echogenicity of the renal cortices without significant thinning consistent with chronic medical renal disease.  No significant abnormality of the liver or spleen.  -Continue to monitor for now.  Return to clinic in 3 months with labs   Orders Placed This Encounter  Procedures   CBC with Differential    Standing Status:   Future    Expected Date:   10/27/2024    Expiration Date:   01/18/2025   Comprehensive metabolic panel    Standing Status:   Future    Expected Date:   10/27/2024    Expiration Date:   01/18/2025   Iron and TIBC ([facility] ONLY)    Standing Status:   Future    Expected Date:   10/27/2024    Expiration Date:   01/18/2025   Ferritin    Standing Status:   Future    Expected Date:   10/27/2024    Expiration Date:   01/18/2025   Vitamin B12    Standing Status:   Future    Expected Date:   10/27/2024    Expiration Date:   01/18/2025   Folate    Standing Status:   Future    Expected Date:   10/27/2024    Expiration Date:   01/18/2025    I spent 20 minutes dedicated to the care of this patient (face-to-face and non-face-to-face) on the date of the encounter to include what is described in the assessment and  plan.    All questions were answered. The patient knows to call the clinic with any problems, questions or concerns. No barriers to learning was detected.  Delon FORBES Hope, NP 12/4/20253:10 PM   SUMMARY OF HEMATOLOGIC HISTORY: Erythrocytosis with no polycythemia, likely secondary - EPO: 21.2-elevated   INTERVAL HISTORY: Moris E Wileman 63 y.o. male following for polycythemia.  Patient reports overall doing well.  Appetite and energy levels are 100%.  He has occasional numbness and burning in his hands and feet.  He received 2 doses of IV Feraheme on 04/15/2024 and 04/24/2024 with good tolerance.  He denies any melena, hematochezia or bright red blood per rectum.  He denies any interval hospitalizations, surgeries or changes to his baseline health.  He has met with gastroenterology (Dr. Gatha) and cardiology (Dr. Venancio) for his underlying heart failure and atrial fibrillation and possible colonoscopy.  Given his heart failure, patient needs to have colonoscopy completed at a tertiary center that has more resources than [facility].  This has not been scheduled yet.  I have reviewed the past medical history, past surgical history, social history and family history with the patient   ALLERGIES:  has no known allergies.  MEDICATIONS:  Current Outpatient Medications  Medication Sig Dispense Refill   acetaminophen  (TYLENOL ) 325 MG tablet Take 1-2 tablets (325-650 mg total) by mouth  every 4 (four) hours as needed for mild pain.     atorvastatin  (LIPITOR) 40 MG tablet Take 40 mg by mouth daily.     Calcium  Carb-Cholecalciferol  600-10 MG-MCG TABS Take 1 tablet by mouth daily with breakfast. 90 tablet 3   Continuous Glucose Receiver (FREESTYLE LIBRE 3 READER) DEVI 1 each by Does not apply route daily. 2 each 3   Continuous Glucose Sensor (FREESTYLE LIBRE 3 SENSOR) MISC Patient is to use senor to monitor blood sugar daily, for 15 days. Senor is to be changed every 15 days. 2 each 3   digoxin   (LANOXIN ) 0.125 MG tablet Take 0.5 tablets (0.0625 mg total) by mouth daily. 30 tablet 5   empagliflozin  (JARDIANCE ) 25 MG TABS tablet Take 1 tablet (25 mg total) by mouth daily. 90 tablet 1   enoxaparin  (LOVENOX ) 40 MG/0.4ML injection Inject 0.4 mLs (40 mg total) into the skin every 12 (twelve) hours. 4 mL 0   Fe Fum-Vit C-Vit B12-FA (TRIGELS-F FORTE) CAPS capsule Take 1 capsule by mouth daily after breakfast. 30 capsule 6   insulin  glargine (LANTUS  SOLOSTAR) 100 UNIT/ML Solostar Pen Inject 40 Units into the skin at bedtime. 45 mL 1   insulin  lispro (HUMALOG ) 100 UNIT/ML KwikPen Use only when pre-meal glucose is above 150 30 mL 0   Insulin  Pen Needle (DROPLET PEN NEEDLES) 32G X 4 MM MISC USE TO INJECT 4 TIMES A DAY 100 each 1   levothyroxine  (SYNTHROID ) 25 MCG tablet Take 1 tablet (25 mcg total) by mouth daily before breakfast. 90 tablet 3   magnesium  oxide (MAG-OX) 400 (240 Mg) MG tablet TAKE 1 TABLET(400 MG) BY MOUTH TWICE DAILY 180 tablet 3   Multiple Vitamin (MULTIVITAMIN WITH MINERALS) TABS tablet Take 1 tablet by mouth daily.     pantoprazole  (PROTONIX ) 40 MG tablet Take 1 tablet (40 mg total) by mouth daily. 90 tablet 3   potassium chloride  SA (KLOR-CON  M) 20 MEQ tablet Take 2 tablets (40 mEq total) by mouth daily. 180 tablet 3   sacubitril -valsartan  (ENTRESTO ) 24-26 MG Take 1 tablet by mouth 2 (two) times daily. 60 tablet 11   sertraline  (ZOLOFT ) 50 MG tablet TAKE 1 TABLET(50 MG) BY MOUTH DAILY 90 tablet 3   sildenafil  (REVATIO ) 20 MG tablet Take 1 tablet (20 mg total) by mouth 3 (three) times daily. 90 tablet 6   SPIRIVA  HANDIHALER 18 MCG inhalation capsule PLACE 1 CAPSULE INTO INHALER AND INHALE EVERY DAY 30 capsule 12   spironolactone  (ALDACTONE ) 25 MG tablet Take 1 tablet (25 mg total) by mouth at bedtime. 90 tablet 3   torsemide  (DEMADEX ) 20 MG tablet Take 3 tablets (60 mg total) by mouth 2 (two) times daily. 180 tablet 10   warfarin (COUMADIN ) 3 MG tablet TAKE 2 TABLETS BY MOUTH  EVERY TUESDAY, THURSDAYS AND SATURDAY AND 1 TABLET ALL OTHER DAYS 120 tablet 6   No current facility-administered medications for this visit.     REVIEW OF SYSTEMS:   Constitutional: Denies fevers, chills or night sweats Eyes: Denies blurriness of vision Ears, nose, mouth, throat, and face: Denies mucositis or sore throat Respiratory: Denies cough, dyspnea or wheezes Cardiovascular: Denies palpitation, chest discomfort or lower extremity swelling Gastrointestinal:  Denies nausea, heartburn or change in bowel habits Skin: Denies abnormal skin rashes Lymphatics: Denies new lymphadenopathy or easy bruising Neurological:Denies numbness, tingling or new weaknesses Behavioral/Psych: Mood is stable, no new changes  All other systems were reviewed with the patient and are negative.  PHYSICAL EXAMINATION:  Vitals:   07/24/24 1355  BP: 109/87  Pulse: 70  Resp: 18  Temp: (!) 97.4 F (36.3 C)  SpO2: 97%    GENERAL:alert, no distress and comfortable SKIN: skin color, texture, turgor are normal, no rashes or significant lesions LYMPH:  no palpable lymphadenopathy in the cervical, axillary or inguinal LUNGS: clear to auscultation and percussion with normal breathing effort HEART: regular rate & rhythm and no murmurs and no lower extremity edema ABDOMEN:abdomen soft, non-tender and normal bowel sounds Musculoskeletal:no cyanosis of digits and no clubbing  NEURO: alert & oriented x 3 with fluent speech  LABORATORY DATA:  I have reviewed the data as listed  Lab Results  Component Value Date   WBC 7.2 07/14/2024   NEUTROABS 5.3 07/14/2024   HGB 17.6 (H) 07/14/2024   HCT 53.8 (H) 07/14/2024   MCV 86.4 07/14/2024   PLT 274 07/14/2024     Chemistry      Component Value Date/Time   NA 139 07/14/2024 0938   NA 135 11/23/2021 0814   K 4.1 07/14/2024 0938   CL 98 07/14/2024 0938   CO2 32 07/14/2024 0938   BUN 27 (H) 07/14/2024 0938   BUN 20 11/23/2021 0814   CREATININE 1.21  07/14/2024 0938      Component Value Date/Time   CALCIUM  9.6 07/14/2024 0938   ALKPHOS 159 (H) 07/14/2024 0938   AST 26 07/14/2024 0938   ALT 30 07/14/2024 0938   BILITOT 0.5 07/14/2024 0938        Latest Reference Range & Units 04/03/24 13:24  Iron 45 - 182 ug/dL 39 (L)  UIBC ug/dL 679  TIBC 749 - 549 ug/dL 640  Saturation Ratios 17.9 - 39.5 % 11 (L)  Ferritin 24 - 336 ng/mL 21 (L)  (L): Data is abnormally low   Latest Reference Range & Units 04/03/24 13:24  Erythropoietin  2.6 - 18.5 mIU/mL 21.2 (H)  (H): Data is abnormally high  RADIOGRAPHIC STUDIES: I have personally reviewed the radiological images as listed and agreed with the findings in the report.  None new to review

## 2024-07-28 ENCOUNTER — Ambulatory Visit: Admitting: "Endocrinology

## 2024-07-28 LAB — POCT INR: INR: 2 (ref 2.0–3.0)

## 2024-07-29 ENCOUNTER — Ambulatory Visit (HOSPITAL_COMMUNITY): Payer: Self-pay | Admitting: Pharmacist

## 2024-07-30 ENCOUNTER — Ambulatory Visit: Admitting: "Endocrinology

## 2024-08-04 ENCOUNTER — Ambulatory Visit (HOSPITAL_COMMUNITY): Payer: Self-pay | Admitting: Pharmacist

## 2024-08-04 LAB — POCT INR: INR: 2.3 (ref 2.0–3.0)

## 2024-08-05 ENCOUNTER — Other Ambulatory Visit: Payer: Self-pay | Admitting: "Endocrinology

## 2024-08-07 ENCOUNTER — Ambulatory Visit (INDEPENDENT_AMBULATORY_CARE_PROVIDER_SITE_OTHER)

## 2024-08-07 DIAGNOSIS — I509 Heart failure, unspecified: Secondary | ICD-10-CM

## 2024-08-08 LAB — CUP PACEART REMOTE DEVICE CHECK
Battery Remaining Longevity: 66 mo
Battery Voltage: 2.98 V
Brady Statistic AP VP Percent: 0.01 %
Brady Statistic AP VS Percent: 3.23 %
Brady Statistic AS VP Percent: 0.01 %
Brady Statistic AS VS Percent: 96.75 %
Brady Statistic RA Percent Paced: 3.22 %
Brady Statistic RV Percent Paced: 0.02 %
Date Time Interrogation Session: 20251218074228
HighPow Impedance: 69 Ohm
Lead Channel Impedance Value: 304 Ohm
Lead Channel Impedance Value: 399 Ohm
Lead Channel Impedance Value: 456 Ohm
Lead Channel Pacing Threshold Amplitude: 0.625 V
Lead Channel Pacing Threshold Amplitude: 2.5 V
Lead Channel Pacing Threshold Pulse Width: 0.4 ms
Lead Channel Pacing Threshold Pulse Width: 0.4 ms
Lead Channel Sensing Intrinsic Amplitude: 4.375 mV
Lead Channel Sensing Intrinsic Amplitude: 4.375 mV
Lead Channel Sensing Intrinsic Amplitude: 5.75 mV
Lead Channel Sensing Intrinsic Amplitude: 5.75 mV
Lead Channel Setting Pacing Amplitude: 1.5 V
Lead Channel Setting Pacing Amplitude: 3 V
Lead Channel Setting Pacing Pulse Width: 1 ms
Lead Channel Setting Sensing Sensitivity: 0.3 mV
Zone Setting Status: 755011
Zone Setting Status: 755011

## 2024-08-10 NOTE — Progress Notes (Signed)
 Remote ICD Transmission

## 2024-08-12 ENCOUNTER — Ambulatory Visit (HOSPITAL_COMMUNITY): Payer: Self-pay | Admitting: Pharmacist

## 2024-08-12 LAB — POCT INR: INR: 1.9 — AB (ref 2.0–3.0)

## 2024-08-15 ENCOUNTER — Ambulatory Visit: Payer: Self-pay | Admitting: Student in an Organized Health Care Education/Training Program

## 2024-08-15 ENCOUNTER — Telehealth (HOSPITAL_COMMUNITY): Payer: Self-pay | Admitting: Unknown Physician Specialty

## 2024-08-15 NOTE — Telephone Encounter (Signed)
 9086: received page from pt stating that he has the flu. Pt tells me that he has fever, chills and that when he went to stand yesterday he stood too fast and fell to his knees. Pt denies hitting his head. Pt refuses ER visit for knee xrays. He says that he is walking fine and doesn't need to go to the hospital. Pt instructed he can take Tylenol  for fever and if he isnt feeling better he needs to go to urgent care for flu test and treatment. Pt verbalized understanding.  Lauraine Ip RN, BSN VAD Coordinator 24/7 Pager [phone]

## 2024-08-18 ENCOUNTER — Ambulatory Visit (HOSPITAL_COMMUNITY): Payer: Self-pay | Admitting: Pharmacist

## 2024-08-18 ENCOUNTER — Encounter: Payer: Self-pay | Admitting: *Deleted

## 2024-08-18 LAB — POCT INR: INR: 2.1 (ref 2.0–3.0)

## 2024-08-25 ENCOUNTER — Ambulatory Visit (HOSPITAL_COMMUNITY): Payer: Self-pay | Admitting: Pharmacist

## 2024-08-25 LAB — POCT INR: INR: 3.1 — AB (ref 2.0–3.0)

## 2024-08-26 ENCOUNTER — Ambulatory Visit (HOSPITAL_COMMUNITY): Admitting: Cardiology

## 2024-08-26 ENCOUNTER — Other Ambulatory Visit (HOSPITAL_COMMUNITY): Payer: Self-pay | Admitting: Cardiology

## 2024-08-26 DIAGNOSIS — Z95811 Presence of heart assist device: Secondary | ICD-10-CM

## 2024-08-26 DIAGNOSIS — I509 Heart failure, unspecified: Secondary | ICD-10-CM

## 2024-08-28 ENCOUNTER — Other Ambulatory Visit (HOSPITAL_COMMUNITY): Payer: Self-pay | Admitting: *Deleted

## 2024-08-28 DIAGNOSIS — Z7901 Long term (current) use of anticoagulants: Secondary | ICD-10-CM

## 2024-08-28 DIAGNOSIS — I509 Heart failure, unspecified: Secondary | ICD-10-CM

## 2024-08-28 DIAGNOSIS — Z95811 Presence of heart assist device: Secondary | ICD-10-CM

## 2024-09-01 ENCOUNTER — Other Ambulatory Visit (HOSPITAL_COMMUNITY): Payer: Self-pay | Admitting: *Deleted

## 2024-09-01 ENCOUNTER — Ambulatory Visit (HOSPITAL_COMMUNITY): Payer: Self-pay | Admitting: Cardiology

## 2024-09-01 ENCOUNTER — Ambulatory Visit (HOSPITAL_COMMUNITY)
Admission: RE | Admit: 2024-09-01 | Discharge: 2024-09-01 | Disposition: A | Source: Ambulatory Visit | Attending: Cardiology | Admitting: Cardiology

## 2024-09-01 ENCOUNTER — Encounter (HOSPITAL_COMMUNITY): Payer: Self-pay | Admitting: Cardiology

## 2024-09-01 ENCOUNTER — Ambulatory Visit (HOSPITAL_COMMUNITY): Payer: Self-pay | Admitting: Pharmacist

## 2024-09-01 DIAGNOSIS — I493 Ventricular premature depolarization: Secondary | ICD-10-CM | POA: Diagnosis not present

## 2024-09-01 DIAGNOSIS — Z95811 Presence of heart assist device: Secondary | ICD-10-CM | POA: Diagnosis not present

## 2024-09-01 DIAGNOSIS — J449 Chronic obstructive pulmonary disease, unspecified: Secondary | ICD-10-CM | POA: Diagnosis not present

## 2024-09-01 DIAGNOSIS — E1122 Type 2 diabetes mellitus with diabetic chronic kidney disease: Secondary | ICD-10-CM | POA: Diagnosis not present

## 2024-09-01 DIAGNOSIS — I509 Heart failure, unspecified: Secondary | ICD-10-CM

## 2024-09-01 DIAGNOSIS — I5022 Chronic systolic (congestive) heart failure: Secondary | ICD-10-CM | POA: Insufficient documentation

## 2024-09-01 DIAGNOSIS — Z79890 Hormone replacement therapy (postmenopausal): Secondary | ICD-10-CM | POA: Insufficient documentation

## 2024-09-01 DIAGNOSIS — I428 Other cardiomyopathies: Secondary | ICD-10-CM | POA: Diagnosis not present

## 2024-09-01 DIAGNOSIS — Z794 Long term (current) use of insulin: Secondary | ICD-10-CM | POA: Insufficient documentation

## 2024-09-01 DIAGNOSIS — N183 Chronic kidney disease, stage 3 unspecified: Secondary | ICD-10-CM | POA: Insufficient documentation

## 2024-09-01 DIAGNOSIS — E039 Hypothyroidism, unspecified: Secondary | ICD-10-CM | POA: Insufficient documentation

## 2024-09-01 DIAGNOSIS — Z7682 Awaiting organ transplant status: Secondary | ICD-10-CM | POA: Insufficient documentation

## 2024-09-01 DIAGNOSIS — Z79899 Other long term (current) drug therapy: Secondary | ICD-10-CM | POA: Insufficient documentation

## 2024-09-01 DIAGNOSIS — D649 Anemia, unspecified: Secondary | ICD-10-CM | POA: Diagnosis not present

## 2024-09-01 DIAGNOSIS — Z7901 Long term (current) use of anticoagulants: Secondary | ICD-10-CM | POA: Diagnosis not present

## 2024-09-01 DIAGNOSIS — Z87891 Personal history of nicotine dependence: Secondary | ICD-10-CM | POA: Insufficient documentation

## 2024-09-01 DIAGNOSIS — Z7984 Long term (current) use of oral hypoglycemic drugs: Secondary | ICD-10-CM | POA: Insufficient documentation

## 2024-09-01 DIAGNOSIS — I4892 Unspecified atrial flutter: Secondary | ICD-10-CM | POA: Diagnosis not present

## 2024-09-01 LAB — BASIC METABOLIC PANEL WITH GFR
Anion gap: 12 (ref 5–15)
BUN: 25 mg/dL — ABNORMAL HIGH (ref 8–23)
CO2: 26 mmol/L (ref 22–32)
Calcium: 9 mg/dL (ref 8.9–10.3)
Chloride: 99 mmol/L (ref 98–111)
Creatinine, Ser: 1.38 mg/dL — ABNORMAL HIGH (ref 0.61–1.24)
GFR, Estimated: 58 mL/min — ABNORMAL LOW
Glucose, Bld: 160 mg/dL — ABNORMAL HIGH (ref 70–99)
Potassium: 4.5 mmol/L (ref 3.5–5.1)
Sodium: 136 mmol/L (ref 135–145)

## 2024-09-01 LAB — PROTIME-INR
INR: 1.3 — ABNORMAL HIGH (ref 0.8–1.2)
Prothrombin Time: 17.1 s — ABNORMAL HIGH (ref 11.4–15.2)

## 2024-09-01 LAB — CBC
HCT: 50 % (ref 39.0–52.0)
Hemoglobin: 16.4 g/dL (ref 13.0–17.0)
MCH: 28.4 pg (ref 26.0–34.0)
MCHC: 32.8 g/dL (ref 30.0–36.0)
MCV: 86.7 fL (ref 80.0–100.0)
Platelets: 331 K/uL (ref 150–400)
RBC: 5.77 MIL/uL (ref 4.22–5.81)
RDW: 15.6 % — ABNORMAL HIGH (ref 11.5–15.5)
WBC: 7.3 K/uL (ref 4.0–10.5)
nRBC: 0 % (ref 0.0–0.2)

## 2024-09-01 LAB — DIGOXIN LEVEL: Digoxin Level: 0.8 ng/mL (ref 0.8–2.0)

## 2024-09-01 LAB — LACTATE DEHYDROGENASE: LDH: 258 U/L — ABNORMAL HIGH (ref 105–235)

## 2024-09-01 LAB — MAGNESIUM: Magnesium: 2 mg/dL (ref 1.7–2.4)

## 2024-09-01 MED ORDER — PANTOPRAZOLE SODIUM 40 MG PO TBEC: 40.0000 mg | DELAYED_RELEASE_TABLET | Freq: Every day | ORAL | 3 refills | Status: AC

## 2024-09-01 NOTE — Patient Instructions (Addendum)
 No medication changes Coumadin  dosing per Lauren PharmD- Take 2 tablets (6 mg) today, then continue current regimen.  Return to clinic in 2 months for follow up with Dr Rolan. We will obtain a full echo at this appt. We will call you with appt information once we have the provider schedule.

## 2024-09-01 NOTE — Progress Notes (Signed)
 "   ADVANCED HF/VAD CLINIC NOTE  Chief complaint: CHF  Mr Episcopo is a 64 y.o. with history of atrial fibrillation/flutter and nonischemic cardiomyopathy who was initially referred by Dr. Alvan for evaluation of CHF.  Patient was diagnosed with CHF in 2020 at [facility] in [place].  At the time, he drank heavily and used cocaine.  He stopped drinking, smoking, and cocaine in 2020 and has not used any of these substances since then. Echo in 11/20 showed EF < 10%, cath in 12/20 showed normal coronaries.  Echo has shown persistently low EF over time, echo in 2/23 showed EF 20-25%.  He has a Medtronic ICD.      Patient was admitted in 2/23 with atrial flutter/RVR. He was started on amiodarone  and converted to NSR.  He had atrial flutter ablation in 5/23 and amiodarone  was stopped  Echo was done in 11/23 showing elevated filling pressure and markedly low cardiac output.  Patient was admitted, started on milrinone , and underwent HM3 LVAD placement with LAA clipping later in 11/23.  Post-op course complicated by acute hypoxemic respiratory failure post-op and PNA.  Patient went to CIR after acute hospital course.   Ramp echo was done in 4/24, at 5500 bpm, the IV septum was shifted mildly to the left with moderate RV dilation and dysfunction, AoV opened rarely.  Speed decreased to 5400 bpm, slight opening AoV every beat, trivial AI, midline IV septum.  Speed left at 5400 bpm.   Ramp echo was done in 2/25.  Speed started at 5400 rpm and was increased to 5600 rpm.  Flow increased from 4.9 L/min to 5.2 L/min.  The aortic valve opened every beat still at 5600 rpm but the septum started out rightward and ended up midline at 5600 rpm.  Speed was left at 5600 rpm. There was moderate RV dysfunction, IVC normal.   Patient had RHC in 3/25 with low output by thermodilution, preserved by Fick. LVAD speed increased to 5700 rpm.   Ramp echo was done in 5/25, slight aortic valve opening with each beat, trivial AI, RV  moderately dilated and dysfunctional. Speed was left at 5700 rpm.   Patient returns for LVAD followup.  He is listed for transplant at [facility].  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  Weight up 4 lbs.  Had influenza around Christmas but now feeling better. No exertional dyspnea, no lightheadedness.  No orthopnea/PND.   Labs (7/24): K 4, creatinine 1.39, hgb 12.3, LDH 219 Labs (10/24): K 4.6, creatinine 1.25, hgb 12.1, LDH 183 Labs (12/24): K 4.3, creatinine 1.2, hgb 13.2 Labs (2/25): K 3.8, creatinine 1.18, hgb 14.2 => 13.2 Labs (5/25): K 3.8, creatinine 1.33, hgb 15.3, LDH 265 Labs (8/25): hgb 16.4, K 4.3, creatinine 1.25, LFTs normal, ferritin 21 with transferrin saturation 11% Labs (9/25): K 4.6, creatinine 1.39 Labs (1/26): hgb 16.4, K 4.5, creatinine 1.38, LDH 258, digoxin  level 0.8  Medtronic device interrogation: VT run x 53 seconds (not treated), few short AF runs  ECG (personally reviewed): NSR with PVCs  PMH: 1. Atrial fibrillation/flutter: Mainly flutter.  - 5/23 Atrial flutter ablation.  2. H/o DVT 3. Chronic systolic CHF: Nonischemic cardiomyopathy.  Medtronic ICD.  - Echo (11/20): EF < 10% - Cath (12/20): Normal coronaries.  - Echo (8/22): EF 30%, mildly decreased RV systolic function.  - Echo (2/23): EF 20-25%, normal RV.  - RHC (11/23): mean RA 14, PA 66/53 mean 54, mean PCWP 43, CI 1.62 F/1.31 T - HM3 LVAD  with LAA clipping on 07/04/22.  - Ramp echo (2/24): Speed increased to 5500 rpm.  At this speed, IV septum was midline.  Aortic valve opened 1/3 beats.  Mild RV enlargement with moderate RV dysfunction.  - Ramp echo (3/24): Speed decreased back to 5400 rpm as the IV septum was shifted left.  Midline septum at 5400 rpm.  - Ramp echo (2/25): Speed increased to 5600 rpm with IV septum midline at 5600.  Moderate RV dysfunction.  - RHC (3/25): mean RA 8, PA 29/10, mean PCWP 8, CI 2.38 F/1.63 T.  - Ramp echo (5/25): Moderately dilated and dysfunctional  RV.  Speed left at 5700 rpm.  4. Prior cocaine abuse.  5. Prior heavy ETOH 6. COPD: Moderate obstruction on 11/23 PFTs. No longer smokes.  7. Type 2 diabetes   SH: Patient was a heavy drinker, abused cocaine, and smoked in the past but quit in 2020.  Lives in [place]. He does part-time custodial work.    FH: Mother with PPM, aunt with CHF.    VAD Indication: Destination Therapy due to uncontrolled diabetes   LVAD assessment: HM III: LVAD Documentation    09/01/2024  Device Info  LVAD Type: Heartmate III  Date of Implant: 07/04/2022  Therapy Type: Destination Therapy      09/01/2024  Vitals  Heart Rate: 84 BPM  Automatic BP: 107/80  Doppler MAP: 102 mmHg  SpO2: 95 %    Last 3 Weights Weight Weight  09/01/2024 96.253 kg 212 lb 3.2 oz  07/24/2024 96.6 kg 212 lb 15.4 oz  06/26/2024 94.711 kg 208 lb 12.8 oz       09/01/2024  LVAD Paramaters  Speed: 5700 RPM  Flow: 5 LPM  PI: 3  Power: 5 Watts  Hematocrit: 54 %  Alarms: none  Events: 20 - 30  Last Speed Change Date: 12/26/2023  Last Ramp Echo Date: 12/26/2023  Last Right Heart Cath Date: 06/29/2022  Bleeding History: Yes  Type of Bleeding Hx: Epistaxis  Type of Dressing: Weekly  Annual Maintenance Date: 06/26/2024    Labs    Units 09/01/24 1359 08/25/24 0000 08/18/24 0000 07/21/24 0000 07/14/24 0938 07/01/24 1000 06/26/24 0909 05/05/24 0000 04/29/24 1100  INR  1.3* 3.1* 2.1   < >  --    < > 1.4*   < > 1.4*  LDH U/L 258*  --   --   --   --   --  221*  --  287*  HGB g/dL 83.5  --   --   --  82.3*  --  17.1*  --  17.4*  CREATININE mg/dL 8.61*  --   --   --  8.78  --  1.25*  --  1.39*   < > = values in this interval not displayed.          ROS: All systems negative except as listed in HPI, PMH and Problem List.  Current Outpatient Medications  Medication Sig Dispense Refill   acetaminophen  (TYLENOL ) 325 MG tablet Take 1-2 tablets (325-650 mg total) by mouth every 4 (four) hours as needed for mild pain.      atorvastatin  (LIPITOR) 40 MG tablet Take 40 mg by mouth daily.     Calcium  Carb-Cholecalciferol  600-10 MG-MCG TABS Take 1 tablet by mouth daily with breakfast. 90 tablet 3   Continuous Glucose Receiver (FREESTYLE LIBRE 3 READER) DEVI 1 each by Does not apply route daily. 2 each 3   Continuous Glucose Sensor (FREESTYLE LIBRE 3  SENSOR) MISC Patient is to use senor to monitor blood sugar daily, for 15 days. Senor is to be changed every 15 days. 2 each 3   digoxin  (LANOXIN ) 0.125 MG tablet Take 0.5 tablets (0.0625 mg total) by mouth daily. 30 tablet 5   empagliflozin  (JARDIANCE ) 25 MG TABS tablet Take 1 tablet (25 mg total) by mouth daily. 90 tablet 1   Fe Fum-Vit C-Vit B12-FA (TRIGELS-F FORTE) CAPS capsule Take 1 capsule by mouth daily after breakfast. 30 capsule 6   insulin  glargine (LANTUS  SOLOSTAR) 100 UNIT/ML Solostar Pen Inject 40 Units into the skin at bedtime. 45 mL 1   insulin  lispro (HUMALOG ) 100 UNIT/ML KwikPen Use only when pre-meal glucose is above 150 30 mL 0   levothyroxine  (SYNTHROID ) 25 MCG tablet Take 1 tablet (25 mcg total) by mouth daily before breakfast. 90 tablet 3   magnesium  oxide (MAG-OX) 400 (240 Mg) MG tablet TAKE 1 TABLET(400 MG) BY MOUTH TWICE DAILY 180 tablet 3   Multiple Vitamin (MULTIVITAMIN WITH MINERALS) TABS tablet Take 1 tablet by mouth daily.     potassium chloride  SA (KLOR-CON  M) 20 MEQ tablet Take 2 tablets (40 mEq total) by mouth daily. 180 tablet 3   sacubitril -valsartan  (ENTRESTO ) 24-26 MG Take 1 tablet by mouth 2 (two) times daily. 60 tablet 11   sertraline  (ZOLOFT ) 50 MG tablet TAKE 1 TABLET(50 MG) BY MOUTH DAILY 90 tablet 3   sildenafil  (REVATIO ) 20 MG tablet Take 1 tablet (20 mg total) by mouth 3 (three) times daily. 90 tablet 6   SPIRIVA  HANDIHALER 18 MCG inhalation capsule PLACE 1 CAPSULE INTO INHALER AND INHALE EVERY DAY 30 capsule 12   spironolactone  (ALDACTONE ) 25 MG tablet Take 1 tablet (25 mg total) by mouth at bedtime. 90 tablet 3   SURE COMFORT PEN  NEEDLES 32G X 4 MM MISC USE TO INJECT 4 TIMES A DAY 100 each 1   torsemide  (DEMADEX ) 20 MG tablet Take 3 tablets (60 mg total) by mouth 2 (two) times daily. 180 tablet 10   warfarin (COUMADIN ) 3 MG tablet TAKE 2 TABLETS BY MOUTH EVERY TUESDAY, THURSDAYS AND SATURDAY AND 1 TABLET ALL OTHER DAYS 120 tablet 6   enoxaparin  (LOVENOX ) 40 MG/0.4ML injection Inject 0.4 mLs (40 mg total) into the skin every 12 (twelve) hours. (Patient not taking: Reported on 09/01/2024) 4 mL 0   pantoprazole  (PROTONIX ) 40 MG tablet Take 1 tablet (40 mg total) by mouth daily. 90 tablet 3   No current facility-administered medications for this encounter.    Patient has no known allergies.  Vital Signs: BP 107/80 Comment: map 87  Pulse 84   Wt 96.3 kg (212 lb 3.2 oz)   SpO2 95%   BMI 29.60 kg/m  MAP 87  Physical Exam: General: Well appearing this am. NAD.  HEENT: Normal. Neck: Supple, JVP 7-8 cm. Carotids OK.  Cardiac:  Mechanical heart sounds with LVAD hum present.  Lungs:  CTAB, normal effort.  Abdomen:  NT, ND, no HSM. No bruits or masses. +BS  LVAD exit site: Well-healed and incorporated. Dressing dry and intact. No erythema or drainage. Stabilization device present and accurately applied. Driveline dressing changed daily per sterile technique. Extremities:  Warm and dry. No cyanosis, clubbing, rash, or edema.  Neuro:  Alert & oriented x 3. Cranial nerves grossly intact. Moves all 4 extremities w/o difficulty. Affect pleasant    ASSESSMENT AND PLAN:   1. Chronic systolic CHF: Nonischemic cardiomyopathy, diagnosed 2020.  At the time, he drank heavily  and used cocaine, so it is possible that this is a substance abuse-related cardiomyopathy though LV function has remained low even with stopping ETOH and cocaine (denies use x several years).  Cath in 12/20 with no significant coronary disease.  Medtronic ICD. Echo in 2/23 showed EF 20-25% with normal RV.  RHC in 11/13 showed markedly elevated filling pressures,  primarily pulmonary venous hypertension, low cardiac output, and low PAPI. Echo in 11/23 with EF <20%, mod RV dysfunction.  He was admitted and started on milrinone  with improvement in hemodynamics.  We proceeded with HM3 LVAD with LAA clip on 07/04/22.  Ramp echo was done in 2/25, speed increased to 5600 rpm.  RHC in 3/25, speed increased to 5700 rpm.  Ramp echo in 5/25 showed moderate RV dilation/moderate RV dysfunction, speed left at 5700 rpm.  LVAD parameters reviewed today and stable.  NYHA class I-II, weight up a bit but not volume overloaded on exam.  LDH stable.  - Continue torsemide  60 mg bid, BMET stable today.  - Continue Entresto  24/26 bid.  - Continue Jardiance  10 mg daily.  - Continue sildenafil  20 tid for RV.  - Continue spironolactone  25 mg daily.   - Continue digoxin  0.0625 daily, level today ok.  - Off beta blocker with RV dysfunction.  - He is now on the transplant list at [facility], UNOS status 4.  I worry about him Ports-term with an LVAD given development of RV failure.   - Echo at followup appt.  2. Atrial fibrillation/atrial flutter: H/o flutter ablation.  Had AF/RVR post-LVAD, went back into NSR.  He is now off amiodarone .  - Continue warfarin.  3. VAD management: s/p HM-3 VAD on 07/04/22.  Stable LVAD parameters.  Ramp echo in 5/25 with speed maintained at 5700.  - Now off ASA.  - Continue warfarin, INR goal 2-2.5. 4. H/o DVT: On anticoagulation.  5. CKD stage 3: On Jardiance . Creatinine 1.38 today.  6. Hypothyroidism - On levothyroxine .  7. Anemia:  Hgb 16.4 today.  8. COPD: Moderate on 11/23 PFTs.  Suspect this has played role in his dyspnea.  - Continue Spiriva .     9. Diabetes: Endocrinology followup.                                                                                                                                                                                                             Followup in 2 months with echo  I spent 42 minutes reviewing data  and LVAD parameters, interviewing patient, and organizing the orders/followup.  Ezra Shuck 09/01/2024      "

## 2024-09-01 NOTE — Progress Notes (Addendum)
 Pt presents for 2 month f/u in VAD clinic today alone. Denies issues with VAD equipment or drive line.   Pt walked into clinic independently. States he is overall feeling ok. Had the flu at the end of December. States he has been taking it easy since he has been sick. States he has been coughing up white sputum (previously green/tan). Has shortness of breath with minimal activity. Denies lightheadedness, dizziness, falls, and signs of bleeding. Pt is working for a it consultant.   Remote interrogation of ICD 08/07/24: 1 VT 53 second episode, and intermittent afib episodes. Obtained EKG today- SR with frequent PVCs. Reviewed with Dr Rolan.   Wearing Freestyle Libre. Taking all medications as prescribed.   Listed for [facility] Status 4 at [facility].   Will need a full echo with next appt per Dr Rolan.   Vital Signs:   Doppler: 102     Automatic BP: 107/80 (87)   HR: 84 NSR     SpO2: 95% on RA  Weight: 212.2 lb w/ equip Last weight: 208.8 lb w/ equip  Destination Therapy due to uncontrolled diabetes   LVAD assessment: HM III:  VAD Speed: 5700 rpms Flow: 4.7     Power: 4.7 w       PI: 3.0    Hct: 54   Alarms: none Events: 20 - 30 daily  Fixed speed: 5700 Low speed limit: 5300  Primary controller: back up battery due for replacement in 25 months Secondary controller: back up battery due for replacement in 20 months   I reviewed the LVAD parameters from today and compared the results to the patient's prior recorded data. LVAD interrogation was NEGATIVE for significant power changes, NEGATIVE for clinical alarms and STABLE for PI events/speed drops. No programming changes were made and pump is functioning within specified parameters. Pt is performing daily controller and system monitor self tests along with completing weekly and monthly maintenance for LVAD equipment.   LVAD equipment check completed and is in good working order. Back-up equipment not present today.   Annual  Equipment Maintenance on UBC/PM was performed on 06/26/2024.   Exit Site Care: Dressing saturated underneath from recent shower. Existing VAD dressing removed and site care performed using sterile technique. Drive line exit site cleaned with Chlora prep applicators x 2, allowed to dry, and Sorbaview dressing with Silverlon patch applied. Exit site healed and incorporated, the velour is fully implanted at exit site. No redness, tenderness, drainage, foul odor or rash noted. Drive line anchor re-applied. 2 large tegaderms placed over dressing. Pt given 16 weekly kits, 13 anchors, and 4 boxes of tegaderm for home use.   BP & Labs:  Doppler 102 - Doppler is reflecting modified systolic   Hgb 16.4- No S/S of bleeding. Specifically denies melena/BRBPR or nosebleeds.   LDH stable at 258 - Denies tea-colored urine. No power elevations noted on interrogation.    Patient Instructions: No medication changes Coumadin  dosing per Lauren PharmD Return to clinic in 2 months for follow up with Dr Rolan. We will obtain a full echo at this appt. We will call you with appt information once we have the provider schedule.   Isaiah Knoll RN VAD Coordinator  Office: [phone]  24/7 Pager: [phone]

## 2024-09-03 ENCOUNTER — Other Ambulatory Visit (HOSPITAL_COMMUNITY): Payer: Self-pay | Admitting: Cardiology

## 2024-09-03 DIAGNOSIS — Z95811 Presence of heart assist device: Secondary | ICD-10-CM

## 2024-09-03 DIAGNOSIS — I509 Heart failure, unspecified: Secondary | ICD-10-CM

## 2024-09-08 ENCOUNTER — Ambulatory Visit (HOSPITAL_COMMUNITY): Payer: Self-pay | Admitting: Pharmacist

## 2024-09-08 ENCOUNTER — Other Ambulatory Visit (HOSPITAL_COMMUNITY): Payer: Self-pay | Admitting: *Deleted

## 2024-09-08 ENCOUNTER — Other Ambulatory Visit: Payer: Self-pay | Admitting: *Deleted

## 2024-09-08 DIAGNOSIS — I509 Heart failure, unspecified: Secondary | ICD-10-CM

## 2024-09-08 DIAGNOSIS — Z7901 Long term (current) use of anticoagulants: Secondary | ICD-10-CM

## 2024-09-08 DIAGNOSIS — Z95811 Presence of heart assist device: Secondary | ICD-10-CM

## 2024-09-08 LAB — POCT INR: INR: 2.2 (ref 2.0–3.0)

## 2024-09-08 MED ORDER — WARFARIN SODIUM 3 MG PO TABS: ORAL_TABLET | ORAL | 6 refills | Status: AC

## 2024-09-08 MED ORDER — POTASSIUM CHLORIDE CRYS ER 20 MEQ PO TBCR: 40.0000 meq | EXTENDED_RELEASE_TABLET | Freq: Every day | ORAL | 3 refills | Status: DC

## 2024-09-08 MED ORDER — POTASSIUM CHLORIDE CRYS ER 20 MEQ PO TBCR: 40.0000 meq | EXTENDED_RELEASE_TABLET | Freq: Every day | ORAL | 3 refills | Status: AC

## 2024-09-08 MED ORDER — WARFARIN SODIUM 3 MG PO TABS: ORAL_TABLET | ORAL | 6 refills | Status: DC

## 2024-09-11 ENCOUNTER — Telehealth (HOSPITAL_COMMUNITY): Payer: Self-pay | Admitting: Unknown Physician Specialty

## 2024-09-11 NOTE — Telephone Encounter (Signed)

## 2024-09-16 ENCOUNTER — Ambulatory Visit (HOSPITAL_COMMUNITY): Payer: Self-pay | Admitting: Pharmacist

## 2024-09-16 LAB — POCT INR: INR: 3.6 — AB (ref 2.0–3.0)

## 2024-09-22 ENCOUNTER — Telehealth (HOSPITAL_COMMUNITY): Payer: Self-pay | Admitting: Unknown Physician Specialty

## 2024-09-22 ENCOUNTER — Ambulatory Visit (HOSPITAL_COMMUNITY): Payer: Self-pay | Admitting: Pharmacist

## 2024-09-22 LAB — POCT INR: INR: 3 (ref 2.0–3.0)

## 2024-09-23 ENCOUNTER — Encounter (HOSPITAL_COMMUNITY): Payer: Self-pay | Admitting: *Deleted

## 2024-09-23 ENCOUNTER — Emergency Department (HOSPITAL_COMMUNITY)
Admission: EM | Admit: 2024-09-23 | Discharge: 2024-09-23 | Disposition: A | Discharge status: Home / Self Care | Attending: Emergency Medicine | Admitting: Emergency Medicine

## 2024-09-23 ENCOUNTER — Emergency Department (HOSPITAL_COMMUNITY)

## 2024-09-23 ENCOUNTER — Other Ambulatory Visit: Payer: Self-pay

## 2024-09-23 DIAGNOSIS — W19XXXA Unspecified fall, initial encounter: Secondary | ICD-10-CM | POA: Insufficient documentation

## 2024-09-23 DIAGNOSIS — Z7901 Long term (current) use of anticoagulants: Secondary | ICD-10-CM | POA: Insufficient documentation

## 2024-09-23 DIAGNOSIS — I482 Chronic atrial fibrillation, unspecified: Secondary | ICD-10-CM | POA: Insufficient documentation

## 2024-09-23 DIAGNOSIS — M545 Low back pain, unspecified: Secondary | ICD-10-CM | POA: Insufficient documentation

## 2024-09-23 DIAGNOSIS — M533 Sacrococcygeal disorders, not elsewhere classified: Secondary | ICD-10-CM | POA: Insufficient documentation

## 2024-09-23 DIAGNOSIS — I509 Heart failure, unspecified: Secondary | ICD-10-CM | POA: Insufficient documentation

## 2024-09-23 DIAGNOSIS — E119 Type 2 diabetes mellitus without complications: Secondary | ICD-10-CM | POA: Insufficient documentation

## 2024-09-23 DIAGNOSIS — Z7984 Long term (current) use of oral hypoglycemic drugs: Secondary | ICD-10-CM | POA: Insufficient documentation

## 2024-09-23 DIAGNOSIS — I110 Hypertensive heart disease with heart failure: Secondary | ICD-10-CM | POA: Insufficient documentation

## 2024-09-23 DIAGNOSIS — Z79899 Other long term (current) drug therapy: Secondary | ICD-10-CM | POA: Insufficient documentation

## 2024-09-23 MED ORDER — ACETAMINOPHEN 325 MG PO TABS
650.0000 mg | ORAL_TABLET | Freq: Once | ORAL | Status: AC
  Administered 2024-09-23: 650 mg via ORAL
  Filled 2024-09-23: qty 2

## 2024-09-23 NOTE — ED Triage Notes (Signed)
 Pt c/o back pain after falling on Friday

## 2024-09-24 ENCOUNTER — Other Ambulatory Visit: Payer: Self-pay | Admitting: "Endocrinology

## 2024-09-24 DIAGNOSIS — Z95811 Presence of heart assist device: Secondary | ICD-10-CM

## 2024-09-24 DIAGNOSIS — I509 Heart failure, unspecified: Secondary | ICD-10-CM

## 2024-09-25 ENCOUNTER — Ambulatory Visit: Admitting: "Endocrinology

## 2024-09-26 ENCOUNTER — Other Ambulatory Visit (HOSPITAL_COMMUNITY): Payer: Self-pay

## 2024-09-26 DIAGNOSIS — Z95811 Presence of heart assist device: Secondary | ICD-10-CM

## 2024-09-26 DIAGNOSIS — F418 Other specified anxiety disorders: Secondary | ICD-10-CM

## 2024-09-26 DIAGNOSIS — I509 Heart failure, unspecified: Secondary | ICD-10-CM

## 2024-09-26 MED ORDER — SERTRALINE HCL 50 MG PO TABS
50.0000 mg | ORAL_TABLET | Freq: Every day | ORAL | 3 refills | Status: DC
  Filled 2024-09-26: qty 90, 90d supply, fill #0

## 2024-09-26 MED ORDER — SERTRALINE HCL 50 MG PO TABS: 50.0000 mg | ORAL_TABLET | Freq: Every day | ORAL | 3 refills | Status: AC

## 2024-10-16 ENCOUNTER — Ambulatory Visit: Admitting: "Endocrinology

## 2024-10-29 ENCOUNTER — Ambulatory Visit (HOSPITAL_COMMUNITY): Admitting: Cardiology

## 2024-10-29 ENCOUNTER — Other Ambulatory Visit (HOSPITAL_COMMUNITY)

## 2024-11-03 ENCOUNTER — Encounter

## 2024-11-06 ENCOUNTER — Encounter

## 2024-11-13 ENCOUNTER — Inpatient Hospital Stay

## 2024-11-20 ENCOUNTER — Inpatient Hospital Stay: Admitting: Oncology

## 2025-02-02 ENCOUNTER — Encounter

## 2025-02-05 ENCOUNTER — Encounter

## 2025-05-04 ENCOUNTER — Encounter

## 2025-05-07 ENCOUNTER — Encounter

## 2025-08-03 ENCOUNTER — Encounter

## 2025-08-06 ENCOUNTER — Encounter
